# Patient Record
Sex: Female | Born: 1950
Health system: Southern US, Community
[De-identification: ages and names within clinical notes are randomized; demographics above are authoritative.]

## PROBLEM LIST (undated history)

## (undated) DIAGNOSIS — I10 Essential (primary) hypertension: Secondary | ICD-10-CM

## (undated) DIAGNOSIS — M67919 Unspecified disorder of synovium and tendon, unspecified shoulder: Secondary | ICD-10-CM

## (undated) DIAGNOSIS — IMO0001 Reserved for inherently not codable concepts without codable children: Secondary | ICD-10-CM

## (undated) DIAGNOSIS — M8580 Other specified disorders of bone density and structure, unspecified site: Secondary | ICD-10-CM

## (undated) DIAGNOSIS — Z9889 Other specified postprocedural states: Secondary | ICD-10-CM

## (undated) DIAGNOSIS — R112 Nausea with vomiting, unspecified: Secondary | ICD-10-CM

## (undated) DIAGNOSIS — A4902 Methicillin resistant Staphylococcus aureus infection, unspecified site: Secondary | ICD-10-CM

## (undated) DIAGNOSIS — M25512 Pain in left shoulder: Secondary | ICD-10-CM

## (undated) DIAGNOSIS — E785 Hyperlipidemia, unspecified: Secondary | ICD-10-CM

## (undated) DIAGNOSIS — C801 Malignant (primary) neoplasm, unspecified: Secondary | ICD-10-CM

## (undated) DIAGNOSIS — K649 Unspecified hemorrhoids: Secondary | ICD-10-CM

## (undated) DIAGNOSIS — G4730 Sleep apnea, unspecified: Secondary | ICD-10-CM

## (undated) DIAGNOSIS — J45909 Unspecified asthma, uncomplicated: Secondary | ICD-10-CM

## (undated) DIAGNOSIS — H269 Unspecified cataract: Secondary | ICD-10-CM

## (undated) DIAGNOSIS — H3530 Unspecified macular degeneration: Secondary | ICD-10-CM

## (undated) DIAGNOSIS — M1990 Unspecified osteoarthritis, unspecified site: Secondary | ICD-10-CM

## (undated) DIAGNOSIS — G4700 Insomnia, unspecified: Secondary | ICD-10-CM

## (undated) DIAGNOSIS — M26629 Arthralgia of temporomandibular joint, unspecified side: Secondary | ICD-10-CM

## (undated) DIAGNOSIS — G8929 Other chronic pain: Secondary | ICD-10-CM

## (undated) DIAGNOSIS — Z85850 Personal history of malignant neoplasm of thyroid: Secondary | ICD-10-CM

## (undated) DIAGNOSIS — M797 Fibromyalgia: Secondary | ICD-10-CM

## (undated) DIAGNOSIS — Z973 Presence of spectacles and contact lenses: Secondary | ICD-10-CM

## (undated) DIAGNOSIS — H8390 Unspecified disease of inner ear, unspecified ear: Secondary | ICD-10-CM

## (undated) DIAGNOSIS — M810 Age-related osteoporosis without current pathological fracture: Secondary | ICD-10-CM

## (undated) DIAGNOSIS — J189 Pneumonia, unspecified organism: Secondary | ICD-10-CM

## (undated) DIAGNOSIS — T7840XA Allergy, unspecified, initial encounter: Secondary | ICD-10-CM

## (undated) DIAGNOSIS — Z87440 Personal history of urinary (tract) infections: Secondary | ICD-10-CM

## (undated) DIAGNOSIS — M4316 Spondylolisthesis, lumbar region: Secondary | ICD-10-CM

## (undated) DIAGNOSIS — T8484XA Pain due to internal orthopedic prosthetic devices, implants and grafts, initial encounter: Secondary | ICD-10-CM

## (undated) DIAGNOSIS — E039 Hypothyroidism, unspecified: Secondary | ICD-10-CM

## (undated) DIAGNOSIS — IMO0002 Reserved for concepts with insufficient information to code with codable children: Secondary | ICD-10-CM

## (undated) DIAGNOSIS — K219 Gastro-esophageal reflux disease without esophagitis: Secondary | ICD-10-CM

## (undated) DIAGNOSIS — G43709 Chronic migraine without aura, not intractable, without status migrainosus: Secondary | ICD-10-CM

## (undated) DIAGNOSIS — I2510 Atherosclerotic heart disease of native coronary artery without angina pectoris: Secondary | ICD-10-CM

## (undated) DIAGNOSIS — M25511 Pain in right shoulder: Secondary | ICD-10-CM

## (undated) HISTORY — PX: BACK SURGERY: SHX140

## (undated) HISTORY — DX: Unspecified asthma, uncomplicated: J45.909

## (undated) HISTORY — PX: OOPHORECTOMY: SHX86

## (undated) HISTORY — DX: Malignant (primary) neoplasm, unspecified: C80.1

## (undated) HISTORY — PX: OTHER SURGICAL HISTORY: SHX169

## (undated) HISTORY — DX: Sleep apnea, unspecified: G47.30

## (undated) HISTORY — PX: EYE SURGERY: SHX253

## (undated) HISTORY — PX: POLYPECTOMY: SHX149

## (undated) HISTORY — DX: Allergy, unspecified, initial encounter: T78.40XA

## (undated) HISTORY — PX: COLONOSCOPY: SHX174

## (undated) HISTORY — DX: Age-related osteoporosis without current pathological fracture: M81.0

## (undated) HISTORY — DX: Other specified disorders of bone density and structure, unspecified site: M85.80

## (undated) HISTORY — PX: UPPER GASTROINTESTINAL ENDOSCOPY: SHX188

## (undated) HISTORY — DX: Unspecified cataract: H26.9

## (undated) HISTORY — PX: CATARACT EXTRACTION, BILATERAL: SHX1313

---
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
## 1988-07-28 HISTORY — PX: VAGINAL HYSTERECTOMY: SUR661

## 1991-07-29 HISTORY — PX: OTHER SURGICAL HISTORY: SHX169

## 1992-07-28 HISTORY — PX: OTHER SURGICAL HISTORY: SHX169

## 1997-07-28 HISTORY — PX: OTHER SURGICAL HISTORY: SHX169

## 2003-07-29 HISTORY — PX: OTHER SURGICAL HISTORY: SHX169

## 2007-05-18 ENCOUNTER — Encounter (INDEPENDENT_AMBULATORY_CARE_PROVIDER_SITE_OTHER): Payer: Self-pay | Admitting: Interventional Radiology

## 2007-05-18 ENCOUNTER — Encounter: Admission: RE | Admit: 2007-05-18 | Discharge: 2007-05-18 | Payer: Self-pay | Admitting: Emergency Medicine

## 2007-05-18 ENCOUNTER — Other Ambulatory Visit: Admission: RE | Admit: 2007-05-18 | Discharge: 2007-05-18 | Payer: Self-pay | Admitting: Interventional Radiology

## 2007-06-28 ENCOUNTER — Encounter: Admission: RE | Admit: 2007-06-28 | Discharge: 2007-06-28 | Payer: Self-pay | Admitting: Endocrinology

## 2007-07-29 HISTORY — PX: TOTAL THYROIDECTOMY: SHX2547

## 2008-01-17 ENCOUNTER — Encounter: Admission: RE | Admit: 2008-01-17 | Discharge: 2008-01-17 | Payer: Self-pay | Admitting: Endocrinology

## 2008-01-26 ENCOUNTER — Encounter: Admission: RE | Admit: 2008-01-26 | Discharge: 2008-01-26 | Payer: Self-pay | Admitting: Endocrinology

## 2008-02-02 ENCOUNTER — Encounter: Admission: RE | Admit: 2008-02-02 | Discharge: 2008-02-02 | Payer: Self-pay | Admitting: Endocrinology

## 2008-06-15 ENCOUNTER — Encounter: Admission: RE | Admit: 2008-06-15 | Discharge: 2008-07-07 | Payer: Self-pay | Admitting: Pediatrics

## 2009-01-05 ENCOUNTER — Encounter: Admission: RE | Admit: 2009-01-05 | Discharge: 2009-01-16 | Payer: Self-pay | Admitting: Specialist

## 2009-01-25 DIAGNOSIS — IMO0001 Reserved for inherently not codable concepts without codable children: Secondary | ICD-10-CM

## 2009-01-25 HISTORY — DX: Reserved for inherently not codable concepts without codable children: IMO0001

## 2009-03-12 ENCOUNTER — Encounter
Admission: RE | Admit: 2009-03-12 | Discharge: 2009-04-17 | Payer: Self-pay | Admitting: Physical Medicine & Rehabilitation

## 2009-09-17 ENCOUNTER — Encounter (HOSPITAL_COMMUNITY): Admission: RE | Admit: 2009-09-17 | Discharge: 2009-11-27 | Payer: Self-pay | Admitting: Endocrinology

## 2009-09-28 ENCOUNTER — Encounter: Admission: RE | Admit: 2009-09-28 | Discharge: 2009-09-28 | Payer: Self-pay | Admitting: Specialist

## 2009-10-26 ENCOUNTER — Ambulatory Visit (HOSPITAL_BASED_OUTPATIENT_CLINIC_OR_DEPARTMENT_OTHER): Admission: RE | Admit: 2009-10-26 | Discharge: 2009-10-26 | Payer: Self-pay | Admitting: Specialist

## 2009-10-26 HISTORY — PX: OTHER SURGICAL HISTORY: SHX169

## 2009-11-05 ENCOUNTER — Encounter: Admission: RE | Admit: 2009-11-05 | Discharge: 2009-12-03 | Payer: Self-pay | Admitting: Specialist

## 2010-07-16 ENCOUNTER — Inpatient Hospital Stay (HOSPITAL_COMMUNITY)
Admission: RE | Admit: 2010-07-16 | Discharge: 2010-07-19 | Payer: Self-pay | Source: Home / Self Care | Attending: Specialist | Admitting: Specialist

## 2010-07-16 HISTORY — PX: TOTAL KNEE ARTHROPLASTY: SHX125

## 2010-08-07 ENCOUNTER — Encounter
Admission: RE | Admit: 2010-08-07 | Discharge: 2010-08-27 | Payer: Self-pay | Source: Home / Self Care | Attending: Specialist | Admitting: Specialist

## 2010-08-18 ENCOUNTER — Encounter: Payer: Self-pay | Admitting: Emergency Medicine

## 2010-08-23 NOTE — Consult Note (Signed)
NAME:  Debbie Hayes, Debbie Hayes            ACCOUNT NO.:  [id number]  MEDICAL RECORD NO.:  [id number]          PATIENT TYPE:  INP  LOCATION:  1601                         FACILITY:  [facility]  PHYSICIAN:  Massie Maroon, MD        DATE OF BIRTH:  06-Apr-1951  DATE OF CONSULTATION: DATE OF DISCHARGE:                                CONSULTATION   CHIEF COMPLAINT:  Hyperglycemia.  HISTORY OF PRESENT ILLNESS:  This is a 60 year old female, diabetic, type 2, status post end-stage osteoarthritis of the right knee, unresponsive to conservative treatment, status post right total knee arthroplasty on July 16, 2010, apparently was having elevated sugars throughout the day.  The patient apparently had been off her Januvia and covered with insulin sliding scale.  Insulin sliding scale was stopped today and her blood sugars have creeped up as well.  She had a blood sugar this evening of 260 at about 10 o'clock and another 224 at 11:30. Medicine is requested to evaluate her for hyperglycemia.  PAST MEDICAL HISTORY: 1. Diabetes, type 2. 2. Hypertension. 3. Hypothyroidism. 4. History of thyroid cancer. 5. Reflux.  PAST SURGICAL HISTORY: 1. Total thyroidectomy for thyroid cancer. 2. Bilateral breast biopsy - benign. 3. Multiple knee arthroscopies. 4. Hysterectomy. 5. Carpal tunnel release bilaterally. 6. Tennis elbow release bilaterally. 7. Right shoulder surgery and left thumb C[facility] joint surgery.  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  FAMILY HISTORY:  Positive for stroke, hypertension, and heart disease.  ALLERGIES: 1. SULFA. 2. MORPHINE SULFATE.  MEDICATIONS: 1. Colace 100 mg p.o. b.i.d. 2. Lovenox 30 mg subcutaneous q.12 h. 3. Pepcid 40 mg p.o. q.h.s. 4. Ferrous sulfate 325 mg p.o. t.i.d. 5. Levothyroxine 175 mcg p.o. q.h.s. 6. Cozaar 25 mg p.o. q.h.s. 7. Lopressor 25 mg p.o. daily. 8. Crestor 2.5 mg p.o. q.h.s. 9. Senokot 1  p.o. b.i.d. as needed. 10.Januvia 100 mg p.o. daily. 11.Tylenol 650 mg p.o. q.4 h. p.r.n. pain. 12.Dulcolax 10 mg p.o. daily p.r.n. constipation. 13.Dulcolax suppository 10 mg per rectum daily p.r.n. constipation. 14.Benadryl 25 mg p.o. q.h.s. p.r.n. anxiety. 15.Relpax 40 mg p.o. b.i.d. p.r.n. 16.Flonase 2 sprays intranasally daily as needed. 17.Dilaudid 0.2-0.6 mg IV q.2 h. as needed. 18.Claritin 10 mg p.o. daily as needed. 19.Robaxin 500 mg p.o. q.6 h. p.r.n. 20.Reglan 5 mg p.o./IV q.8 h. p.r.n. 21.Zofran 4 mg IV q.6 h. p.r.n. 22.Restoril 15 mg to 30 mg p.o. q.h.s. p.r.n. insomnia.  PHYSICAL EXAMINATION:  VITAL SIGNS:  Temperature 98.5; pulse 102, repeat 85; blood pressure 144/69, pulse ox of 96% on room air. HEENT:  Anicteric. NECK:  No JVD, no bruit. HEART:  Regular rate and rhythm.  S1 and S2.  No murmurs, gallops, or rubs. LUNGS:  Clear to auscultation bilaterally. ABDOMEN:  Soft, nontender, nondistended.  Positive bowel sounds. EXTREMITIES:  No cyanosis, clubbing, or edema.  LABORATORY DATA:  Sodium 136, potassium 4.1, BUN 7, creatinine 0.63, glucose 185, WBC 11.1, hemoglobin 10.7, platelet count 154.  ASSESSMENT/PLAN: 1. Hyperglycemia:  The patient has been eating regular diet rather     than carb modified or  ADA 2500-calorie diet.  Januvia was just     restarted today and sometimes takes a period of time before it     starts to work well.  The other reason why her blood sugar may be     elevated is that it is not unusual after surgery to see blood sugar     to be somewhat elevated.  The patient will be covered with NovoLog     sensitive sliding scale and if her blood sugar is not improving,     then we will consider adding Amaryl 1 mg p.o. daily to her Januvia.     I considered metformin,     but she said it causes her to have GI upset.  Therefore, we will     try Amaryl if her blood sugars do not calm down tomorrow.  Thank you for letting us to follow her for her  hyperglycemia.     Massie Maroon, MD     JYK/MEDQ  D:  07/18/2010  T:  07/18/2010  Job:  [id number]  cc:   Erasmo Leventhal, M.D. Fax: [phone]  Stan Head. Cleta Alberts, M.D. Fax: [phone]  Electronically Signed by Pearson Grippe MD on 08/23/2010 11:30:34 PM

## 2010-08-28 ENCOUNTER — Ambulatory Visit: Payer: 59 | Attending: Specialist | Admitting: Physical Therapy

## 2010-08-28 DIAGNOSIS — IMO0001 Reserved for inherently not codable concepts without codable children: Secondary | ICD-10-CM | POA: Insufficient documentation

## 2010-08-28 DIAGNOSIS — M25569 Pain in unspecified knee: Secondary | ICD-10-CM | POA: Insufficient documentation

## 2010-08-28 DIAGNOSIS — M25669 Stiffness of unspecified knee, not elsewhere classified: Secondary | ICD-10-CM | POA: Insufficient documentation

## 2010-08-28 DIAGNOSIS — Z96659 Presence of unspecified artificial knee joint: Secondary | ICD-10-CM | POA: Insufficient documentation

## 2010-08-30 ENCOUNTER — Ambulatory Visit: Payer: 59 | Admitting: Physical Therapy

## 2010-08-30 ENCOUNTER — Encounter: Payer: Self-pay | Admitting: Physical Therapy

## 2010-09-02 ENCOUNTER — Ambulatory Visit: Payer: 59 | Attending: Specialist | Admitting: Physical Therapy

## 2010-09-02 DIAGNOSIS — IMO0001 Reserved for inherently not codable concepts without codable children: Secondary | ICD-10-CM | POA: Insufficient documentation

## 2010-09-02 DIAGNOSIS — M25569 Pain in unspecified knee: Secondary | ICD-10-CM | POA: Insufficient documentation

## 2010-09-02 DIAGNOSIS — Z96659 Presence of unspecified artificial knee joint: Secondary | ICD-10-CM | POA: Insufficient documentation

## 2010-09-02 DIAGNOSIS — M25669 Stiffness of unspecified knee, not elsewhere classified: Secondary | ICD-10-CM | POA: Insufficient documentation

## 2010-09-05 ENCOUNTER — Ambulatory Visit: Payer: 59 | Admitting: Physical Therapy

## 2010-09-06 ENCOUNTER — Ambulatory Visit: Payer: 59 | Admitting: Physical Therapy

## 2010-09-09 ENCOUNTER — Ambulatory Visit: Payer: 59 | Admitting: Physical Therapy

## 2010-09-11 ENCOUNTER — Ambulatory Visit (HOSPITAL_BASED_OUTPATIENT_CLINIC_OR_DEPARTMENT_OTHER)
Admission: RE | Admit: 2010-09-11 | Discharge: 2010-09-11 | Disposition: A | Discharge status: Home / Self Care | Payer: 59 | Source: Ambulatory Visit | Attending: Specialist | Admitting: Specialist

## 2010-09-11 DIAGNOSIS — M24669 Ankylosis, unspecified knee: Secondary | ICD-10-CM | POA: Insufficient documentation

## 2010-09-11 HISTORY — PX: OTHER SURGICAL HISTORY: SHX169

## 2010-09-11 LAB — POCT I-STAT 4, (NA,K, GLUC, HGB,HCT)
Glucose, Bld: 122 mg/dL — ABNORMAL HIGH (ref 70–99)
HCT: 41 % (ref 36.0–46.0)
Hemoglobin: 13.9 g/dL (ref 12.0–15.0)
Potassium: 3.9 mEq/L (ref 3.5–5.1)
Sodium: 137 mEq/L (ref 135–145)

## 2010-09-12 ENCOUNTER — Ambulatory Visit: Payer: 59 | Admitting: Physical Therapy

## 2010-09-13 ENCOUNTER — Ambulatory Visit: Payer: 59 | Admitting: Physical Therapy

## 2010-09-16 ENCOUNTER — Ambulatory Visit: Payer: 59 | Admitting: Physical Therapy

## 2010-09-17 ENCOUNTER — Ambulatory Visit: Payer: 59 | Admitting: Physical Therapy

## 2010-09-18 ENCOUNTER — Ambulatory Visit: Payer: 59 | Admitting: Physical Therapy

## 2010-09-19 ENCOUNTER — Ambulatory Visit: Payer: 59 | Admitting: Physical Therapy

## 2010-09-20 ENCOUNTER — Ambulatory Visit: Payer: 59 | Admitting: Physical Therapy

## 2010-09-23 ENCOUNTER — Ambulatory Visit: Payer: 59 | Admitting: Physical Therapy

## 2010-09-24 ENCOUNTER — Ambulatory Visit: Payer: 59 | Admitting: Physical Therapy

## 2010-09-25 NOTE — Op Note (Signed)
  NAME:  Debbie Hayes, Debbie Hayes            ACCOUNT NO.:  [id number]  MEDICAL RECORD NO.:  [id number]           PATIENT TYPE:  I  LOCATION:  [facility]                         FACILITY:  [facility]  PHYSICIAN:  Erasmo Leventhal, M.D.DATE OF BIRTH:  Dec 14, 1950  DATE OF PROCEDURE: DATE OF DISCHARGE:                              OPERATIVE REPORT   PREOPERATIVE DIAGNOSIS:  Right knee postoperative arthrofibrosis.  POSTOPERATIVE DIAGNOSIS:  Right knee postoperative arthrofibrosis.  PROCEDURE:  Right knee manipulation under anesthesia.  SURGEON:  Erasmo Leventhal, MD.  ANESTHESIOLOGIST:  Dr. Rica Mast.  ANESTHESIA:  General with femoral nerve block.  ESTIMATED BLOOD LOSS:  None.  COMPLICATIONS:  None.  DISPOSITION:  PACU stable.  DESCRIPTION OF PROCEDURE:  The patient and family counselled in the holding area.  Correct side was identified.  IV was stated and femoral nerve block was administered.  She was then taken to the operative room, placed under general anesthesia.  Initial range of motion was 5 to about 80 degrees.  After gentle manipulation flexion and extension, final range of motion was 3 to 120 degrees.  Knee was stable.  No complications or problems.  Also immobilized patella.  Placed Ace wrap, ice pack, and knee immobilizer.  Focused attention.  She was awakened and taken from the operating room to PACU in stable condition.  She will be discharged home.  I instructed to her husband.  Postop pain medications; oxycodone, Tylenol, Robaxin.          ______________________________ Erasmo Leventhal, M.D.     RAC/MEDQ  D:  09/11/2010  T:  09/12/2010  Job:  [id number]  Electronically Signed by Eugenia Mcalpine M.D. on 09/25/2010 05:19:51 PM

## 2010-09-26 ENCOUNTER — Ambulatory Visit: Payer: 59 | Attending: Specialist | Admitting: Physical Therapy

## 2010-09-26 DIAGNOSIS — Z96659 Presence of unspecified artificial knee joint: Secondary | ICD-10-CM | POA: Insufficient documentation

## 2010-09-26 DIAGNOSIS — IMO0001 Reserved for inherently not codable concepts without codable children: Secondary | ICD-10-CM | POA: Insufficient documentation

## 2010-09-26 DIAGNOSIS — M25569 Pain in unspecified knee: Secondary | ICD-10-CM | POA: Insufficient documentation

## 2010-09-26 DIAGNOSIS — M25669 Stiffness of unspecified knee, not elsewhere classified: Secondary | ICD-10-CM | POA: Insufficient documentation

## 2010-09-27 ENCOUNTER — Ambulatory Visit: Payer: 59 | Admitting: Physical Therapy

## 2010-10-01 ENCOUNTER — Ambulatory Visit: Payer: 59 | Admitting: Physical Therapy

## 2010-10-02 ENCOUNTER — Ambulatory Visit: Payer: 59 | Admitting: Physical Therapy

## 2010-10-03 ENCOUNTER — Ambulatory Visit: Payer: 59 | Admitting: Physical Therapy

## 2010-10-04 ENCOUNTER — Ambulatory Visit: Payer: 59 | Admitting: Physical Therapy

## 2010-10-07 ENCOUNTER — Ambulatory Visit: Payer: 59 | Admitting: Physical Therapy

## 2010-10-07 LAB — BASIC METABOLIC PANEL
BUN: 7 mg/dL (ref 6–23)
BUN: 7 mg/dL (ref 6–23)
CO2: 28 mEq/L (ref 19–32)
Calcium: 8.4 mg/dL (ref 8.4–10.5)
Chloride: 101 mEq/L (ref 96–112)
Chloride: 103 mEq/L (ref 96–112)
Creatinine, Ser: 0.63 mg/dL (ref 0.4–1.2)
GFR calc Af Amer: >60 mL/min (ref >60)
GFR calc Af Amer: >60 mL/min (ref >60)
GFR calc non Af Amer: >60 mL/min (ref >60)
GFR calc non Af Amer: >60 mL/min (ref >60)
Glucose, Bld: 185 mg/dL — ABNORMAL HIGH (ref 70–99)
Potassium: 3.1 mEq/L — ABNORMAL LOW (ref 3.5–5.1)
Sodium: 135 mEq/L (ref 135–145)
Sodium: 139 mEq/L (ref 135–145)

## 2010-10-07 LAB — CBC
HCT: 30.3 % — ABNORMAL LOW (ref 36.0–46.0)
HCT: 30.9 % — ABNORMAL LOW (ref 36.0–46.0)
Hemoglobin: 10.1 g/dL — ABNORMAL LOW (ref 12.0–15.0)
Hemoglobin: 10.3 g/dL — ABNORMAL LOW (ref 12.0–15.0)
Hemoglobin: 10.7 g/dL — ABNORMAL LOW (ref 12.0–15.0)
MCH: 28.1 pg (ref 26.0–34.0)
MCH: 28.5 pg (ref 26.0–34.0)
MCHC: 32.5 g/dL (ref 30.0–36.0)
MCHC: 33.3 g/dL (ref 30.0–36.0)
MCV: 84.7 fL (ref 78.0–100.0)
MCV: 85.4 fL (ref 78.0–100.0)
MCV: 86.4 fL (ref 78.0–100.0)
Platelets: 154 10*3/uL (ref 150–400)
RBC: 3.55 MIL/uL — ABNORMAL LOW (ref 3.87–5.11)
RBC: 3.65 MIL/uL — ABNORMAL LOW (ref 3.87–5.11)
RDW: 13.5 % (ref 11.5–15.5)
WBC: 9.5 10*3/uL (ref 4.0–10.5)

## 2010-10-07 LAB — GLUCOSE, CAPILLARY
Glucose-Capillary: 186 mg/dL — ABNORMAL HIGH (ref 70–99)
Glucose-Capillary: 190 mg/dL — ABNORMAL HIGH (ref 70–99)
Glucose-Capillary: 224 mg/dL — ABNORMAL HIGH (ref 70–99)
Glucose-Capillary: 90 mg/dL (ref 70–99)

## 2010-10-07 LAB — CROSSMATCH: Unit division: 0

## 2010-10-08 LAB — APTT: aPTT: 36 seconds (ref 24–37)

## 2010-10-08 LAB — SURGICAL PCR SCREEN
MRSA, PCR: NEGATIVE
Staphylococcus aureus: NEGATIVE

## 2010-10-08 LAB — COMPREHENSIVE METABOLIC PANEL
ALT: 21 U/L (ref 0–35)
Albumin: 4.2 g/dL (ref 3.5–5.2)
BUN: 17 mg/dL (ref 6–23)
Calcium: 9.8 mg/dL (ref 8.4–10.5)
Glucose, Bld: 123 mg/dL — ABNORMAL HIGH (ref 70–99)
Potassium: 4.1 mEq/L (ref 3.5–5.1)
Sodium: 136 mEq/L (ref 135–145)
Total Protein: 7.3 g/dL (ref 6.0–8.3)

## 2010-10-08 LAB — DIFFERENTIAL
Lymphs Abs: 1.2 10*3/uL (ref 0.7–4.0)
Monocytes Absolute: 0.9 10*3/uL (ref 0.1–1.0)
Monocytes Relative: 15 % — ABNORMAL HIGH (ref 3–12)
Neutro Abs: 3.5 10*3/uL (ref 1.7–7.7)
Neutrophils Relative %: 61 % (ref 43–77)

## 2010-10-08 LAB — URINALYSIS, ROUTINE W REFLEX MICROSCOPIC
Glucose, UA: NEGATIVE mg/dL
Ketones, ur: NEGATIVE mg/dL
Protein, ur: NEGATIVE mg/dL
Urobilinogen, UA: 0.2 mg/dL (ref 0.0–1.0)

## 2010-10-08 LAB — CBC
HCT: 42.6 % (ref 36.0–46.0)
MCHC: 33.3 g/dL (ref 30.0–36.0)
Platelets: 185 10*3/uL (ref 150–400)
RDW: 13.3 % (ref 11.5–15.5)
WBC: 5.7 10*3/uL (ref 4.0–10.5)

## 2010-10-08 LAB — PROTIME-INR: INR: 0.98 (ref 0.00–1.49)

## 2010-10-10 ENCOUNTER — Ambulatory Visit: Payer: 59 | Admitting: Physical Therapy

## 2010-10-11 ENCOUNTER — Ambulatory Visit: Payer: 59 | Admitting: Physical Therapy

## 2010-10-14 ENCOUNTER — Ambulatory Visit: Payer: 59 | Admitting: Rehabilitation

## 2010-10-16 ENCOUNTER — Encounter: Payer: 59 | Admitting: Physical Therapy

## 2010-10-16 LAB — POCT I-STAT 4, (NA,K, GLUC, HGB,HCT)
Glucose, Bld: 109 mg/dL — ABNORMAL HIGH (ref 70–99)
HCT: 40 % (ref 36.0–46.0)
Potassium: 3.8 mEq/L (ref 3.5–5.1)

## 2010-10-17 ENCOUNTER — Ambulatory Visit: Payer: 59 | Admitting: Physical Therapy

## 2010-10-24 ENCOUNTER — Ambulatory Visit: Payer: 59 | Admitting: Physical Therapy

## 2010-10-25 ENCOUNTER — Ambulatory Visit: Payer: 59 | Admitting: Physical Therapy

## 2010-10-30 ENCOUNTER — Ambulatory Visit: Payer: 59 | Attending: Specialist | Admitting: Rehabilitation

## 2010-10-30 DIAGNOSIS — Z96659 Presence of unspecified artificial knee joint: Secondary | ICD-10-CM | POA: Insufficient documentation

## 2010-10-30 DIAGNOSIS — IMO0001 Reserved for inherently not codable concepts without codable children: Secondary | ICD-10-CM | POA: Insufficient documentation

## 2010-10-30 DIAGNOSIS — M25669 Stiffness of unspecified knee, not elsewhere classified: Secondary | ICD-10-CM | POA: Insufficient documentation

## 2010-10-30 DIAGNOSIS — M25569 Pain in unspecified knee: Secondary | ICD-10-CM | POA: Insufficient documentation

## 2010-10-31 ENCOUNTER — Ambulatory Visit: Payer: 59 | Admitting: Physical Therapy

## 2010-11-04 ENCOUNTER — Ambulatory Visit: Payer: 59 | Admitting: Physical Therapy

## 2010-11-06 ENCOUNTER — Ambulatory Visit: Payer: 59 | Admitting: Physical Therapy

## 2010-11-07 ENCOUNTER — Other Ambulatory Visit: Payer: Self-pay | Admitting: Radiology

## 2010-11-11 ENCOUNTER — Ambulatory Visit: Payer: 59 | Admitting: Physical Therapy

## 2010-11-13 ENCOUNTER — Ambulatory Visit: Payer: 59 | Admitting: Physical Therapy

## 2010-11-15 ENCOUNTER — Ambulatory Visit: Payer: 59 | Admitting: Physical Therapy

## 2010-11-18 ENCOUNTER — Ambulatory Visit: Payer: 59 | Admitting: Physical Therapy

## 2010-11-20 ENCOUNTER — Ambulatory Visit: Payer: 59 | Admitting: Physical Therapy

## 2010-11-25 ENCOUNTER — Ambulatory Visit: Payer: 59 | Admitting: Physical Therapy

## 2010-11-28 ENCOUNTER — Ambulatory Visit: Payer: 59 | Admitting: Physical Therapy

## 2011-04-22 ENCOUNTER — Other Ambulatory Visit (HOSPITAL_COMMUNITY): Payer: Self-pay | Admitting: Neurology

## 2011-04-22 DIAGNOSIS — R209 Unspecified disturbances of skin sensation: Secondary | ICD-10-CM

## 2011-05-01 ENCOUNTER — Ambulatory Visit (HOSPITAL_COMMUNITY)
Admission: RE | Admit: 2011-05-01 | Discharge: 2011-05-01 | Disposition: A | Discharge status: Home / Self Care | Payer: 59 | Source: Ambulatory Visit | Attending: Neurology | Admitting: Neurology

## 2011-05-01 DIAGNOSIS — R209 Unspecified disturbances of skin sensation: Secondary | ICD-10-CM | POA: Insufficient documentation

## 2011-05-29 ENCOUNTER — Ambulatory Visit (HOSPITAL_COMMUNITY)
Admission: RE | Admit: 2011-05-29 | Discharge: 2011-05-29 | Disposition: A | Discharge status: Home / Self Care | Payer: 59 | Source: Ambulatory Visit | Attending: Emergency Medicine | Admitting: Emergency Medicine

## 2011-05-29 DIAGNOSIS — R209 Unspecified disturbances of skin sensation: Secondary | ICD-10-CM | POA: Insufficient documentation

## 2011-05-29 DIAGNOSIS — R42 Dizziness and giddiness: Secondary | ICD-10-CM

## 2011-05-29 DIAGNOSIS — R4789 Other speech disturbances: Secondary | ICD-10-CM | POA: Insufficient documentation

## 2011-05-29 DIAGNOSIS — R0989 Other specified symptoms and signs involving the circulatory and respiratory systems: Secondary | ICD-10-CM

## 2011-06-06 ENCOUNTER — Other Ambulatory Visit (HOSPITAL_COMMUNITY): Payer: Self-pay | Admitting: Neurology

## 2011-06-06 ENCOUNTER — Other Ambulatory Visit: Payer: Self-pay | Admitting: Neurology

## 2011-06-06 DIAGNOSIS — E119 Type 2 diabetes mellitus without complications: Secondary | ICD-10-CM

## 2011-06-06 DIAGNOSIS — R209 Unspecified disturbances of skin sensation: Secondary | ICD-10-CM

## 2011-06-06 DIAGNOSIS — I10 Essential (primary) hypertension: Secondary | ICD-10-CM

## 2011-06-13 ENCOUNTER — Ambulatory Visit (HOSPITAL_COMMUNITY): Payer: 59

## 2011-06-13 ENCOUNTER — Ambulatory Visit (HOSPITAL_COMMUNITY)
Admission: RE | Admit: 2011-06-13 | Discharge: 2011-06-13 | Disposition: A | Discharge status: Home / Self Care | Payer: 59 | Source: Ambulatory Visit | Attending: Neurology | Admitting: Neurology

## 2011-06-13 ENCOUNTER — Other Ambulatory Visit (HOSPITAL_COMMUNITY): Payer: Self-pay | Admitting: Radiology

## 2011-06-13 DIAGNOSIS — E119 Type 2 diabetes mellitus without complications: Secondary | ICD-10-CM

## 2011-06-13 DIAGNOSIS — I10 Essential (primary) hypertension: Secondary | ICD-10-CM

## 2011-06-13 DIAGNOSIS — R209 Unspecified disturbances of skin sensation: Secondary | ICD-10-CM

## 2011-06-13 LAB — CREATININE, SERUM: GFR calc Af Amer: >90 mL/min (ref >90)

## 2011-06-13 MED ORDER — GADOBENATE DIMEGLUMINE 529 MG/ML IV SOLN
17.0000 mL | Freq: Once | INTRAVENOUS | Status: AC
  Administered 2011-06-13: 17 mL via INTRAVENOUS

## 2011-07-10 ENCOUNTER — Ambulatory Visit (INDEPENDENT_AMBULATORY_CARE_PROVIDER_SITE_OTHER): Payer: 59

## 2011-07-10 DIAGNOSIS — S93419A Sprain of calcaneofibular ligament of unspecified ankle, initial encounter: Secondary | ICD-10-CM

## 2011-07-16 ENCOUNTER — Ambulatory Visit (INDEPENDENT_AMBULATORY_CARE_PROVIDER_SITE_OTHER): Payer: 59

## 2011-07-16 DIAGNOSIS — E7800 Pure hypercholesterolemia, unspecified: Secondary | ICD-10-CM

## 2011-07-16 DIAGNOSIS — I10 Essential (primary) hypertension: Secondary | ICD-10-CM

## 2011-07-16 DIAGNOSIS — E119 Type 2 diabetes mellitus without complications: Secondary | ICD-10-CM

## 2011-07-29 HISTORY — PX: OTHER SURGICAL HISTORY: SHX169

## 2011-09-05 ENCOUNTER — Encounter (HOSPITAL_BASED_OUTPATIENT_CLINIC_OR_DEPARTMENT_OTHER): Payer: Self-pay | Admitting: *Deleted

## 2011-09-05 ENCOUNTER — Other Ambulatory Visit: Payer: Self-pay | Admitting: Pain Medicine

## 2011-09-08 ENCOUNTER — Encounter (HOSPITAL_BASED_OUTPATIENT_CLINIC_OR_DEPARTMENT_OTHER): Payer: Self-pay | Admitting: *Deleted

## 2011-09-08 NOTE — Progress Notes (Signed)
NPO AFTER MN ARRIVES AT 1030. NEEDS ISTAT AND EKG. WILL TAKE LOPRESSOR AND NEXIUM AM OF SURG. W/ SIP OF WATER.

## 2011-09-10 ENCOUNTER — Ambulatory Visit (HOSPITAL_BASED_OUTPATIENT_CLINIC_OR_DEPARTMENT_OTHER): Payer: 59 | Admitting: Anesthesiology

## 2011-09-10 ENCOUNTER — Other Ambulatory Visit: Payer: Self-pay

## 2011-09-10 ENCOUNTER — Encounter (HOSPITAL_BASED_OUTPATIENT_CLINIC_OR_DEPARTMENT_OTHER)
Admission: RE | Disposition: A | Discharge status: Home / Self Care | Payer: Self-pay | Source: Ambulatory Visit | Attending: Specialist

## 2011-09-10 ENCOUNTER — Encounter (HOSPITAL_BASED_OUTPATIENT_CLINIC_OR_DEPARTMENT_OTHER): Payer: Self-pay | Admitting: *Deleted

## 2011-09-10 ENCOUNTER — Encounter (HOSPITAL_BASED_OUTPATIENT_CLINIC_OR_DEPARTMENT_OTHER): Payer: Self-pay | Admitting: Anesthesiology

## 2011-09-10 ENCOUNTER — Ambulatory Visit (HOSPITAL_BASED_OUTPATIENT_CLINIC_OR_DEPARTMENT_OTHER)
Admission: RE | Admit: 2011-09-10 | Discharge: 2011-09-10 | Disposition: A | Discharge status: Home / Self Care | Payer: 59 | Source: Ambulatory Visit | Attending: Specialist | Admitting: Specialist

## 2011-09-10 DIAGNOSIS — K219 Gastro-esophageal reflux disease without esophagitis: Secondary | ICD-10-CM | POA: Insufficient documentation

## 2011-09-10 DIAGNOSIS — Z01810 Encounter for preprocedural cardiovascular examination: Secondary | ICD-10-CM | POA: Insufficient documentation

## 2011-09-10 DIAGNOSIS — M19019 Primary osteoarthritis, unspecified shoulder: Secondary | ICD-10-CM | POA: Insufficient documentation

## 2011-09-10 DIAGNOSIS — E785 Hyperlipidemia, unspecified: Secondary | ICD-10-CM | POA: Insufficient documentation

## 2011-09-10 DIAGNOSIS — E039 Hypothyroidism, unspecified: Secondary | ICD-10-CM | POA: Insufficient documentation

## 2011-09-10 DIAGNOSIS — M75100 Unspecified rotator cuff tear or rupture of unspecified shoulder, not specified as traumatic: Secondary | ICD-10-CM | POA: Insufficient documentation

## 2011-09-10 DIAGNOSIS — M25519 Pain in unspecified shoulder: Secondary | ICD-10-CM | POA: Insufficient documentation

## 2011-09-10 DIAGNOSIS — E119 Type 2 diabetes mellitus without complications: Secondary | ICD-10-CM | POA: Insufficient documentation

## 2011-09-10 DIAGNOSIS — X58XXXA Exposure to other specified factors, initial encounter: Secondary | ICD-10-CM | POA: Insufficient documentation

## 2011-09-10 DIAGNOSIS — Z79899 Other long term (current) drug therapy: Secondary | ICD-10-CM | POA: Insufficient documentation

## 2011-09-10 DIAGNOSIS — S46819A Strain of other muscles, fascia and tendons at shoulder and upper arm level, unspecified arm, initial encounter: Secondary | ICD-10-CM | POA: Insufficient documentation

## 2011-09-10 DIAGNOSIS — Z9889 Other specified postprocedural states: Secondary | ICD-10-CM

## 2011-09-10 DIAGNOSIS — M25819 Other specified joint disorders, unspecified shoulder: Secondary | ICD-10-CM | POA: Insufficient documentation

## 2011-09-10 DIAGNOSIS — IMO0002 Reserved for concepts with insufficient information to code with codable children: Secondary | ICD-10-CM | POA: Insufficient documentation

## 2011-09-10 DIAGNOSIS — S43429A Sprain of unspecified rotator cuff capsule, initial encounter: Secondary | ICD-10-CM | POA: Insufficient documentation

## 2011-09-10 DIAGNOSIS — M24119 Other articular cartilage disorders, unspecified shoulder: Secondary | ICD-10-CM | POA: Insufficient documentation

## 2011-09-10 HISTORY — DX: Nausea with vomiting, unspecified: R11.2

## 2011-09-10 HISTORY — DX: Hyperlipidemia, unspecified: E78.5

## 2011-09-10 HISTORY — DX: Hypothyroidism, unspecified: E03.9

## 2011-09-10 HISTORY — DX: Reserved for concepts with insufficient information to code with codable children: IMO0002

## 2011-09-10 HISTORY — DX: Pain in left shoulder: M25.512

## 2011-09-10 HISTORY — DX: Reserved for inherently not codable concepts without codable children: IMO0001

## 2011-09-10 HISTORY — DX: Unspecified osteoarthritis, unspecified site: M19.90

## 2011-09-10 HISTORY — DX: Arthralgia of temporomandibular joint, unspecified side: M26.629

## 2011-09-10 HISTORY — DX: Unspecified hemorrhoids: K64.9

## 2011-09-10 HISTORY — DX: Unspecified disorder of synovium and tendon, unspecified shoulder: M67.919

## 2011-09-10 HISTORY — DX: Fibromyalgia: M79.7

## 2011-09-10 HISTORY — DX: Other chronic pain: G89.29

## 2011-09-10 HISTORY — DX: Gastro-esophageal reflux disease without esophagitis: K21.9

## 2011-09-10 HISTORY — DX: Pain in right shoulder: M25.511

## 2011-09-10 HISTORY — DX: Spondylolisthesis, lumbar region: M43.16

## 2011-09-10 HISTORY — DX: Unspecified disease of inner ear, unspecified ear: H83.90

## 2011-09-10 HISTORY — DX: Other specified postprocedural states: Z98.890

## 2011-09-10 HISTORY — DX: Chronic migraine without aura, not intractable, without status migrainosus: G43.709

## 2011-09-10 HISTORY — DX: Insomnia, unspecified: G47.00

## 2011-09-10 HISTORY — DX: Essential (primary) hypertension: I10

## 2011-09-10 HISTORY — DX: Personal history of malignant neoplasm of thyroid: Z85.850

## 2011-09-10 LAB — POCT I-STAT 4, (NA,K, GLUC, HGB,HCT)
Glucose, Bld: 120 mg/dL — ABNORMAL HIGH (ref 70–99)
Potassium: 4.1 mEq/L (ref 3.5–5.1)

## 2011-09-10 LAB — GLUCOSE, CAPILLARY: Glucose-Capillary: 111 mg/dL — ABNORMAL HIGH (ref 70–99)

## 2011-09-10 SURGERY — SHOULDER ARTHROSCOPY WITH SUBACROMIAL DECOMPRESSION, ROTATOR CUFF REPAIR AND BICEP TENDON REPAIR
Anesthesia: General | Site: Shoulder | Laterality: Left | Wound class: Clean

## 2011-09-10 MED ORDER — FENTANYL CITRATE 0.05 MG/ML IJ SOLN: 25.0000 ug | INTRAMUSCULAR | Status: DC | PRN

## 2011-09-10 MED ORDER — PROPOFOL 10 MG/ML IV EMUL
INTRAVENOUS | Status: DC | PRN
  Administered 2011-09-10: 120 mg via INTRAVENOUS
  Administered 2011-09-10: 30 mg via INTRAVENOUS

## 2011-09-10 MED ORDER — LIDOCAINE HCL (CARDIAC) 20 MG/ML IV SOLN
INTRAVENOUS | Status: DC | PRN
  Administered 2011-09-10: 50 mg via INTRAVENOUS

## 2011-09-10 MED ORDER — FENTANYL CITRATE 0.05 MG/ML IJ SOLN
50.0000 ug | Freq: Two times a day (BID) | INTRAMUSCULAR | Status: DC
  Administered 2011-09-10: 50 ug via INTRAVENOUS

## 2011-09-10 MED ORDER — ONDANSETRON HCL 4 MG/2ML IJ SOLN
INTRAMUSCULAR | Status: DC | PRN
  Administered 2011-09-10: 4 mg via INTRAVENOUS

## 2011-09-10 MED ORDER — GLYCOPYRROLATE 0.2 MG/ML IJ SOLN
INTRAMUSCULAR | Status: DC | PRN
  Administered 2011-09-10: 0.3 mg via INTRAVENOUS

## 2011-09-10 MED ORDER — GLYCOPYRROLATE 0.2 MG/ML IJ SOLN
INTRAMUSCULAR | Status: DC | PRN
  Administered 2011-09-10: 0.2 mg via INTRAVENOUS

## 2011-09-10 MED ORDER — MIDAZOLAM HCL 5 MG/ML IJ SOLN
1.0000 mg | Freq: Two times a day (BID) | INTRAMUSCULAR | Status: DC
  Administered 2011-09-10: 1 mg via INTRAVENOUS

## 2011-09-10 MED ORDER — CEPHALEXIN 500 MG PO CAPS: 500.0000 mg | ORAL_CAPSULE | Freq: Three times a day (TID) | ORAL | Status: AC

## 2011-09-10 MED ORDER — POVIDONE-IODINE 7.5 % EX SOLN: Freq: Once | CUTANEOUS | Status: DC

## 2011-09-10 MED ORDER — PROMETHAZINE HCL 25 MG/ML IJ SOLN: 6.2500 mg | INTRAMUSCULAR | Status: DC | PRN

## 2011-09-10 MED ORDER — HYDROMORPHONE HCL 2 MG PO TABS: 2.0000 mg | ORAL_TABLET | ORAL | Status: AC | PRN

## 2011-09-10 MED ORDER — ROCURONIUM BROMIDE 100 MG/10ML IV SOLN
INTRAVENOUS | Status: DC | PRN
  Administered 2011-09-10: 35 mg via INTRAVENOUS

## 2011-09-10 MED ORDER — LACTATED RINGERS IV SOLN
INTRAVENOUS | Status: DC
  Administered 2011-09-10 (×2): via INTRAVENOUS

## 2011-09-10 MED ORDER — CEFAZOLIN SODIUM-DEXTROSE 2-3 GM-% IV SOLR
2.0000 g | INTRAVENOUS | Status: AC
  Administered 2011-09-10: 2 g via INTRAVENOUS

## 2011-09-10 MED ORDER — NEOSTIGMINE METHYLSULFATE 1 MG/ML IJ SOLN
INTRAMUSCULAR | Status: DC | PRN
  Administered 2011-09-10: 1.5 mg via INTRAVENOUS

## 2011-09-10 MED ORDER — ROPIVACAINE HCL 5 MG/ML IJ SOLN
INTRAMUSCULAR | Status: DC | PRN
  Administered 2011-09-10: 30 mL

## 2011-09-10 MED ORDER — SODIUM CHLORIDE 0.9 % IR SOLN
Status: DC | PRN
  Administered 2011-09-10: 18000 mL

## 2011-09-10 MED ORDER — SODIUM CHLORIDE 0.9 % IV SOLN: INTRAVENOUS | Status: DC

## 2011-09-10 MED ORDER — FENTANYL CITRATE 0.05 MG/ML IJ SOLN
INTRAMUSCULAR | Status: DC | PRN
  Administered 2011-09-10: 50 ug via INTRAVENOUS

## 2011-09-10 MED ORDER — SODIUM CHLORIDE 0.9 % IJ SOLN
INTRAMUSCULAR | Status: DC | PRN
  Administered 2011-09-10: 14:00:00

## 2011-09-10 SURGICAL SUPPLY — 73 items
BLADE CUDA 4.2 (BLADE) IMPLANT
BLADE CUDA GRT WHITE 3.5 (BLADE) ×2 IMPLANT
BLADE CUTTER GATOR 3.5 (BLADE) IMPLANT
BLADE GREAT WHITE 4.2 (BLADE) ×2 IMPLANT
BLADE SURG 11 STRL SS (BLADE) ×2 IMPLANT
BLADE SURG 15 STRL LF DISP TIS (BLADE) ×1 IMPLANT
BLADE SURG 15 STRL SS (BLADE) ×1
BUPIVACAINE IMPLANT
BUR 3.5 LG SPHERICAL (BURR) IMPLANT
BUR OVAL 6.0 (BURR) ×2 IMPLANT
BURR 3.5 LG SPHERICAL (BURR)
CANISTER SUCT LVC 12 LTR MEDI- (MISCELLANEOUS) ×6 IMPLANT
CANISTER SUCTION 2500CC (MISCELLANEOUS) IMPLANT
CANNULA 5.75X7 CRYSTAL CLEAR (CANNULA) ×2 IMPLANT
CANNULA 5.75X71 LONG (CANNULA) IMPLANT
CANNULA TWIST IN 8.25X7CM (CANNULA) ×4 IMPLANT
CLOTH BEACON ORANGE TIMEOUT ST (SAFETY) ×2 IMPLANT
COVER MAYO STAND STRL (DRAPES) ×2 IMPLANT
COVER TABLE BACK 60X90 (DRAPES) ×2 IMPLANT
DRAPE LG THREE QUARTER DISP (DRAPES) IMPLANT
DRAPE ORTHO SPLIT 77X108 STRL (DRAPES) ×2
DRAPE POUCH INSTRU U-SHP 10X18 (DRAPES) ×2 IMPLANT
DRAPE STERI 35X30 U-POUCH (DRAPES) ×2 IMPLANT
DRAPE SURG 17X23 STRL (DRAPES) ×2 IMPLANT
DRAPE SURG ORHT 6 SPLT 77X108 (DRAPES) ×2 IMPLANT
DRAPE U-SHAPE 47X51 STRL (DRAPES) ×2 IMPLANT
DRSG PAD ABDOMINAL 8X10 ST (GAUZE/BANDAGES/DRESSINGS) ×2 IMPLANT
DURAPREP 26ML APPLICATOR (WOUND CARE) ×2 IMPLANT
ELECT MENISCUS 165MM 90D (ELECTRODE) IMPLANT
ELECT REM PT RETURN 9FT ADLT (ELECTROSURGICAL) ×2
ELECTRODE REM PT RTRN 9FT ADLT (ELECTROSURGICAL) ×1 IMPLANT
FIBERSTICK 2 (SUTURE) IMPLANT
GAUZE XEROFORM 1X8 LF (GAUZE/BANDAGES/DRESSINGS) ×2 IMPLANT
GLOVE BIO SURGEON STRL SZ 6.5 (GLOVE) ×2 IMPLANT
GLOVE BIO SURGEON STRL SZ7.5 (GLOVE) ×2 IMPLANT
GLOVE ECLIPSE 6.0 STRL STRAW (GLOVE) ×2 IMPLANT
GLOVE INDICATOR 8.0 STRL GRN (GLOVE) ×4 IMPLANT
GLOVE SURG ORTHO 8.0 STRL STRW (GLOVE) ×2 IMPLANT
GOWN PREVENTION PLUS LG XLONG (DISPOSABLE) ×2 IMPLANT
GOWN STRL REIN XL XLG (GOWN DISPOSABLE) ×4 IMPLANT
KIT SHOULDER TRACTION (DRAPES) ×2 IMPLANT
LASSO SUT 90 DEGREE (SUTURE) IMPLANT
NEEDLE 1/2 CIR CATGUT .05X1.09 (NEEDLE) IMPLANT
NEEDLE HYPO 22GX1.5 SAFETY (NEEDLE) ×2 IMPLANT
NEEDLE SCORPION (NEEDLE) IMPLANT
NEEDLE SPNL 18GX3.5 QUINCKE PK (NEEDLE) ×2 IMPLANT
NS IRRIG 500ML POUR BTL (IV SOLUTION) IMPLANT
PACK BASIN DAY SURGERY FS (CUSTOM PROCEDURE TRAY) ×2 IMPLANT
PENCIL BUTTON HOLSTER BLD 10FT (ELECTRODE) IMPLANT
SET ARTHROSCOPY TUBING (MISCELLANEOUS) ×1
SET ARTHROSCOPY TUBING LN (MISCELLANEOUS) ×1 IMPLANT
SLEEVE SURGEON STRL (DRAPES) ×2 IMPLANT
SLING ARM FOAM STRAP LRG (SOFTGOODS) ×2 IMPLANT
SLING ULTRA II AB L (ORTHOPEDIC SUPPLIES) IMPLANT
SLING ULTRA II AB S (ORTHOPEDIC SUPPLIES) IMPLANT
SPONGE GAUZE 4X4 12PLY (GAUZE/BANDAGES/DRESSINGS) ×2 IMPLANT
SPONGE LAP 4X18 X RAY DECT (DISPOSABLE) IMPLANT
SUCTION FRAZIER TIP 10 FR DISP (SUCTIONS) IMPLANT
SUT ETHILON 2 0 PS N (SUTURE) ×2 IMPLANT
SUT LASSO 45 DEGREE LEFT (SUTURE) IMPLANT
SUT LASSO 45D RIGHT (SUTURE) IMPLANT
SUT PDS AB 1 CT1 27 (SUTURE) IMPLANT
SUT VIC AB 0 CT1 36 (SUTURE) IMPLANT
SUT VIC AB 2-0 CT1 27 (SUTURE)
SUT VIC AB 2-0 CT1 TAPERPNT 27 (SUTURE) IMPLANT
SYR 20CC LL (SYRINGE) ×2 IMPLANT
SYR CONTROL 10ML LL (SYRINGE) IMPLANT
TAPE HYPAFIX 6X30 (GAUZE/BANDAGES/DRESSINGS) ×2 IMPLANT
TOWEL OR 17X24 6PK STRL BLUE (TOWEL DISPOSABLE) ×4 IMPLANT
TUBE CONNECTING 12X1/4 (SUCTIONS) ×4 IMPLANT
WAND 90 DEG TURBOVAC W/CORD (SURGICAL WAND) ×2 IMPLANT
WATER STERILE IRR 500ML POUR (IV SOLUTION) ×2 IMPLANT
YANKAUER SUCT BULB TIP NO VENT (SUCTIONS) IMPLANT

## 2011-09-10 NOTE — Op Note (Signed)
Preop diagnosis left shoulder possible labral tear impingement syndrome possible rotator cuff tear a.c. joint arthritis Postop diagnosis left shoulder type I tear superior labrum. Intact biceps tendon, partial thickness supraspinatous tear 30-40%. A.c. arthritis severe subacromial bursitis Procedure 1 left shoulder intra-articular arthroscopy glenohumeral joint with labral debridement. #2 subacromial debridement with bursectomy acromioplasty [place] labral release with debridement of partial cuff tear and #3 arthroscopic distal clavicle resection [place] teaming him for the procedure Surgeon Valma Cava M.D. Midge Aver PA-C Surgeon anesthesia Gen. tendon with interscalene block Guessed about loss less than 50 cc Drains none Complications none Disposition PACU stable  Operative details Patient was counseled in the holding area cracks identified and marked and signed appropriately chart reviewed and signed appropriately interscalene block administered. Although inoperative IV Ancef was given. In the OR she has or general anesthesia Jennie placed the right lower lateral decubitus position probably padded. Abduction to go forward 04 range of motion stable. Chief prepped with DuraPrep draped into a sterile fashion. Overhead shoulder positioner was utilized at 30 of abduction 10 forward flexion and 10 pounds of longitudinal traction. Comment was done done and confirmed all involved and room prior to draping. Portal was crated [place] posterior the glenohumeral joint diagnostic arthroscopy revealed a relatively intact rotator cuff with some mild drainage applied surgeon very mild intact at the biceps tendon to cartilage of the humeral head was impacted Lorcet mild softening the laboratory type I tear superiorly notify detachment of the biceps but some fraying and wear and it region. Anterior portal made an outside in technique the rotator cuff interval merchant shows induces regret healthy tissues go down the  cautery system ingrowth cup breakup to see the labrum only. Tsao retrograded back to healthy edges was noted stable. [employer] was was present subacromial region very thick subacromial bursa was encountered lateral portal was established her questions were protected Nerve bursectomy was performed motorize shaver [employer] system review was released periosteally Meshed a type II, 3 acromion morphology. Burs and placed posteriorly anterior inferior acromioplasty was performed convert her to a flat ankle morphology a.c. joint remarking osteoarthritic approach placed anterior and the lateral 5-8 mm of the topic was removed was removed circumferentially the the superior capsule intact there was a degeneration distal clavicle was resected back to healthy bone is felt to be stable of sculptor removed hemostasis was obtained. Or damaging the rotator cuff surface the [employer] 100 hepatitis at a lightly. As part at 30-40% partial tear the rotator cuff draped in a typical chromic coracoacromial arch consistent extrinsic impingement. Lateral scope was removed and a traction postpone Lahey pulses were intact the portal was 4 nylon suture and no further anesthetic was blood in the case sterile dressings applied flawless length she was turned supine awakened and taken operative stable condition. No complications or problems. She restabilize the PACU discharge to home.  Dealt with patient positioning prepping draping and it was surgical assistance throughout the entire case wound closure patient dressing and sling Mr. Nadine Counts [facility] assistance was needed throughout the entire case

## 2011-09-10 NOTE — Transfer of Care (Signed)
Immediate Anesthesia Transfer of Care Note  Patient: Debbie Hayes  Procedure(s) Performed: Procedure(s) (LRB): SHOULDER ARTHROSCOPY WITH SUBACROMIAL DECOMPRESSION, ROTATOR CUFF REPAIR AND BICEP TENDON REPAIR (Left)  Patient Location: PACU  Anesthesia Type: General  Level of Consciousness: awake, alert  and oriented  Airway & Oxygen Therapy: Patient Spontanous Breathing and Patient connected to nasal cannula oxygen  Post-op Assessment: Report given to PACU RN  Post vital signs: Reviewed and stable  Complications: No apparent anesthesia complications

## 2011-09-10 NOTE — Anesthesia Procedure Notes (Addendum)
Anesthesia Regional Block:  Interscalene brachial plexus block  Pre-Anesthetic Checklist: ,, timeout performed, Correct Patient, Correct Site, Correct Laterality, Correct Procedure, Correct Position, site marked, Risks and benefits discussed,  Surgical consent,  Pre-op evaluation,  At surgeon's request and post-op pain management   Prep: chloraprep       Needles:  Injection technique: Single-shot  Needle Type: Stimiplex          Additional Needles:  Procedures: ultrasound guided and nerve stimulator Interscalene brachial plexus block Narrative:  Injection made incrementally with aspirations every 5 mL.  Performed by: Personally  Anesthesiologist: Consuela Widener  Additional Notes: Risks, benefits and alternative to block explained extensively.  No pain on injection. No increased resistance to injection.  Motor intact immediately after block. Loss of deltoid function at 20 minutes.   Patient tolerated procedure well, without complications.  Supraclavicular block Procedure Name: Intubation Performed by: Maris Berger Pre-anesthesia Checklist: Patient identified, Emergency Drugs available, Suction available and Patient being monitored Patient Re-evaluated:Patient Re-evaluated prior to inductionOxygen Delivery Method: Circle System Utilized Preoxygenation: Pre-oxygenation with 100% oxygen Intubation Type: IV induction Ventilation: Mask ventilation without difficulty Laryngoscope Size: Mac and 3 Grade View: Grade I Tube type: Oral Tube size: 7.0 mm Number of attempts: 1 Airway Equipment and Method: stylet Placement Confirmation: ETT inserted through vocal cords under direct vision,  positive ETCO2 and breath sounds checked- equal and bilateral Secured at: 21 cm Tube secured with: Tape Dental Injury: Teeth and Oropharynx as per pre-operative assessment

## 2011-09-10 NOTE — H&P (Cosign Needed)
Debbie Hayes is an 61 y.o. female.   Chief Complaint: Left shoulder pain HPI: 61 year old female well known to Dr. Thomasena Edis for evaluation left shoulder pain right which her rate and includes x-rays and MRI. Patient failed conservative treatment. Patient continues to have pain. Additionally like to proceed with surgery which is of left shoulder arthroscopy evaluation SAD DCR possible rotator cuff repair with possible biceps tenotomy  Past Medical History  Diagnosis Date  . Rotator cuff disorder LEFT SHOULDER RTC IMPINGMENT  . Hyperlipidemia   . Normal nuclear stress test 01-25-2009  . Disorder of inner ear CAUSES VERTIGO OCCASIONALLY  . Chronic migraine BOTOX INJECTION EVERY 3 MONTHS  . Fibromyalgia   . Hemorrhoid   . Constipation   . Spondylolisthesis of lumbar region   . GERD (gastroesophageal reflux disease)   . Hypothyroidism   . History of thyroid cancer 2009  S/P TOTAL THYROIDECTOMY AND RADIATION  . Diabetes mellitus   . 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 Insomnia   . PONV (postoperative nausea and vomiting) SEVERE  . Chronic pain in right shoulder   . Left shoulder pain   . OA (osteoarthritis) JOINTS  . Hypertension CARDIOLOGIST- DR Donnie Aho- WILL REQUEST LATE NOTE    DENIES S & S    Past Surgical History  Procedure Date  . Right knee closed manipulation 09-11-2010  . Total knee arthroplasty 07-16-2010    RIGHT  . Right knee arthroscopy/ partial lateral menisectomy/ tricompartment chondroplasty/ decompression cyst 10-26-2009  . Right knee arthroscopy X2  BEFORE 2011  . Total thyroidectomy 2009    CANCER  (POST-OP BLEED)  AND RADIATION [place]  . Left thumb joint replacement 2005    RIGHT DONE IN 2004  . Right shoulder arthroscopy 2010  &  2004  . Bilateral elbow surg. 1999  . Bilateral carpal tunnel release 1994  . Vaginal hysterectomy 1990  . Bilateral salpingoophectomy 1993    POST-OP URETER REPAIR 12 DAYS AFTER   . Tonsillectomy 1968  . Tibia cyst removed  and orif leg fx 1958    History reviewed. No pertinent family history. Social History:  reports that she has never smoked. She has never used smokeless tobacco. She reports that she drinks alcohol. She reports that she does not use illicit drugs.  Allergies:  Allergies  Allergen Reactions  . Morphine And Related Nausea And Vomiting and Other (See Comments)    SEVERE N/V AND MIGRAINES  . Sulfa Antibiotics Hives    Medications Prior to Admission  Medication Dose Route Frequency Provider Last Rate Last Dose  . 0.9 %  sodium chloride infusion   Intravenous Continuous Jamelle Rushing, PA      . ceFAZolin (ANCEF) IVPB 2 g/50 mL premix  2 g Intravenous 60 min Pre-Op Jamelle Rushing, PA      . fentaNYL (SUBLIMAZE) injection 50 mcg  50 mcg Intravenous BID Azell Der, MD   50 mcg at 09/10/11 1209  . lactated ringers infusion   Intravenous Continuous Jill Side, MD 50 mL/hr at 09/10/11 1130    . midazolam (VERSED) injection 1 mg  1 mg Intravenous BID Azell Der, MD   1 mg at 09/10/11 1209  . povidone-iodine (BETADINE) 7.5 % scrub   Topical Once Jamelle Rushing, PA       Medications Prior to Admission  Medication Sig Dispense Refill  . acetaminophen (TYLENOL) 650 MG CR tablet Take 650 mg by mouth every 8 (eight) hours as needed.      Marland Kitchen  aspirin 81 MG tablet Take 81 mg by mouth daily.      . cetirizine (ZYRTEC) 10 MG tablet Take 10 mg by mouth daily.      Marland Kitchen eletriptan (RELPAX) 40 MG tablet One tablet by mouth as needed for migraine headache.  If the headache improves and then returns, dose may be repeated after 2 hours have elapsed since first dose (do not exceed 80 mg per day). may repeat in 2 hours if necessary      . esomeprazole (NEXIUM) 40 MG capsule Take 40 mg by mouth as needed.      Marland Kitchen ibuprofen (ADVIL,MOTRIN) 200 MG tablet Take 200 mg by mouth every 6 (six) hours as needed.      Marland Kitchen levothyroxine (SYNTHROID, LEVOTHROID) 150 MCG tablet Take 150 mcg by mouth every evening.      .  lidocaine (LIDODERM) 5 % Place 1 patch onto the skin as needed. Remove & Discard patch within 12 hours or as directed by MD      . losartan (COZAAR) 25 MG tablet Take 25 mg by mouth every evening.      . metaxalone (SKELAXIN) 800 MG tablet Take 800 mg by mouth as needed.      . Methylcellulose, Laxative, (CITRUCEL PO) Take 600 mg by mouth 2 (two) times daily.      . metoprolol tartrate (LOPRESSOR) 12.5 mg TABS Take 12.5 mg by mouth every morning.      . OnabotulinumtoxinA (BOTOX IJ) Inject as directed. Every 3 months for migraines      . polyethylene glycol (MIRALAX / GLYCOLAX) packet Take 17 g by mouth as needed.      . sitaGLIPtin (JANUVIA) 100 MG tablet Take 100 mg by mouth every morning.      . traZODone (DESYREL) 100 MG tablet Take 100 mg by mouth at bedtime.      . triamcinolone (NASACORT) 55 MCG/ACT nasal inhaler Place 2 sprays into the nose as needed.        Results for orders placed during the hospital encounter of 09/10/11 (from the past 48 hour(s))  POCT I-STAT 4, (NA,K, GLUC, HGB,HCT)     Status: Abnormal   Collection Time   09/10/11 11:32 AM      Component Value Range Comment   Sodium 141  135 - 145 (mEq/L)    Potassium 4.1  3.5 - 5.1 (mEq/L)    Glucose, Bld 120 (*) 70 - 99 (mg/dL)    HCT 09.8  11.9 - 14.7 (%)    Hemoglobin 14.6  12.0 - 15.0 (g/dL)    No results found.  Review of Systems  Constitutional: Negative.   HENT: Negative.   Eyes: Negative.   Respiratory: Negative.   Cardiovascular: Negative.   Gastrointestinal: Negative.     Blood pressure 135/82, pulse 70, temperature 97.3 F (36.3 C), temperature source Oral, resp. rate 19, height 5\' 7"  (1.702 m), weight 82.555 kg (182 lb), SpO2 94.00%. Physical Exam  patient is conscious alert and appropriate well-developed female appears to be in no distress heart is regular rate and rhythm abdomen soft nontender bowel sounds present lungs are clear throughout her left shoulder is weak to do interscalene block on the  left   Assessment/Plan Left shoulder pain possible rotator cuff tear biceps tendinitis with impingement At this time patient would like to proceed with a left shoulder scope for about possible rotator cuff repair SAD DCR with possible labral debridement and biceps tenotomy. Patient has discussed pros and cons with  Dr. Thomasena Edis and like to proceed  Veleta Yamamoto ANDREW 09/10/2011, 1:08 PM

## 2011-09-10 NOTE — H&P (Signed)
Debbie Hayes is an 61 y.o. female.   Chief Complaint: Left shoulder pain HPI: 61 year old female well known to Dr. Thomasena Edis for evaluation left shoulder pain right which her rate and includes x-rays and MRI. Patient failed conservative treatment. Patient continues to have pain. Additionally like to proceed with surgery which is of left shoulder arthroscopy evaluation SAD DCR possible rotator cuff repair with possible biceps tenotomy  Past Medical History  Diagnosis Date  . Rotator cuff disorder LEFT SHOULDER RTC IMPINGMENT  . Hyperlipidemia   . Normal nuclear stress test 01-25-2009  . Disorder of inner ear CAUSES VERTIGO OCCASIONALLY  . Chronic migraine BOTOX INJECTION EVERY 3 MONTHS  . Fibromyalgia   . Hemorrhoid   . Constipation   . Spondylolisthesis of lumbar region   . GERD (gastroesophageal reflux disease)   . Hypothyroidism   . History of thyroid cancer 2009  S/P TOTAL THYROIDECTOMY AND RADIATION  . Diabetes mellitus   . 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 Insomnia   . PONV (postoperative nausea and vomiting) SEVERE  . Chronic pain in right shoulder   . Left shoulder pain   . OA (osteoarthritis) JOINTS  . Hypertension CARDIOLOGIST- DR Donnie Aho- WILL REQUEST LATE NOTE    DENIES S & S    Past Surgical History  Procedure Date  . Right knee closed manipulation 09-11-2010  . Total knee arthroplasty 07-16-2010    RIGHT  . Right knee arthroscopy/ partial lateral menisectomy/ tricompartment chondroplasty/ decompression cyst 10-26-2009  . Right knee arthroscopy X2  BEFORE 2011  . Total thyroidectomy 2009    CANCER  (POST-OP BLEED)  AND RADIATION [place]  . Left thumb joint replacement 2005    RIGHT DONE IN 2004  . Right shoulder arthroscopy 2010  &  2004  . Bilateral elbow surg. 1999  . Bilateral carpal tunnel release 1994  . Vaginal hysterectomy 1990  . Bilateral salpingoophectomy 1993    POST-OP URETER REPAIR 12 DAYS AFTER   . Tonsillectomy 1968  . Tibia cyst removed  and orif leg fx 1958    History reviewed. No pertinent family history. Social History:  reports that she has never smoked. She has never used smokeless tobacco. She reports that she drinks alcohol. She reports that she does not use illicit drugs.  Allergies:  Allergies  Allergen Reactions  . Morphine And Related Nausea And Vomiting and Other (See Comments)    SEVERE N/V AND MIGRAINES  . Sulfa Antibiotics Hives    Medications Prior to Admission  Medication Dose Route Frequency Provider Last Rate Last Dose  . 0.9 %  sodium chloride infusion   Intravenous Continuous Jamelle Rushing, PA      . ceFAZolin (ANCEF) IVPB 2 g/50 mL premix  2 g Intravenous 60 min Pre-Op Jamelle Rushing, PA      . fentaNYL (SUBLIMAZE) injection 50 mcg  50 mcg Intravenous BID Azell Der, MD   50 mcg at 09/10/11 1209  . lactated ringers infusion   Intravenous Continuous Jill Side, MD 50 mL/hr at 09/10/11 1130    . midazolam (VERSED) injection 1 mg  1 mg Intravenous BID Azell Der, MD   1 mg at 09/10/11 1209  . povidone-iodine (BETADINE) 7.5 % scrub   Topical Once Jamelle Rushing, PA       Medications Prior to Admission  Medication Sig Dispense Refill  . acetaminophen (TYLENOL) 650 MG CR tablet Take 650 mg by mouth every 8 (eight) hours as needed.      Marland Kitchen  aspirin 81 MG tablet Take 81 mg by mouth daily.      . cetirizine (ZYRTEC) 10 MG tablet Take 10 mg by mouth daily.      Marland Kitchen eletriptan (RELPAX) 40 MG tablet One tablet by mouth as needed for migraine headache.  If the headache improves and then returns, dose may be repeated after 2 hours have elapsed since first dose (do not exceed 80 mg per day). may repeat in 2 hours if necessary      . esomeprazole (NEXIUM) 40 MG capsule Take 40 mg by mouth as needed.      Marland Kitchen ibuprofen (ADVIL,MOTRIN) 200 MG tablet Take 200 mg by mouth every 6 (six) hours as needed.      Marland Kitchen levothyroxine (SYNTHROID, LEVOTHROID) 150 MCG tablet Take 150 mcg by mouth every evening.      .  lidocaine (LIDODERM) 5 % Place 1 patch onto the skin as needed. Remove & Discard patch within 12 hours or as directed by MD      . losartan (COZAAR) 25 MG tablet Take 25 mg by mouth every evening.      . metaxalone (SKELAXIN) 800 MG tablet Take 800 mg by mouth as needed.      . Methylcellulose, Laxative, (CITRUCEL PO) Take 600 mg by mouth 2 (two) times daily.      . metoprolol tartrate (LOPRESSOR) 12.5 mg TABS Take 12.5 mg by mouth every morning.      . OnabotulinumtoxinA (BOTOX IJ) Inject as directed. Every 3 months for migraines      . polyethylene glycol (MIRALAX / GLYCOLAX) packet Take 17 g by mouth as needed.      . sitaGLIPtin (JANUVIA) 100 MG tablet Take 100 mg by mouth every morning.      . traZODone (DESYREL) 100 MG tablet Take 100 mg by mouth at bedtime.      . triamcinolone (NASACORT) 55 MCG/ACT nasal inhaler Place 2 sprays into the nose as needed.        No results found for this or any previous visit (from the past 48 hour(s)). No results found.  Review of Systems  Constitutional: Negative.   HENT: Negative.   Eyes: Negative.   Respiratory: Negative.   Cardiovascular: Negative.   Gastrointestinal: Negative.     Blood pressure 135/82, pulse 70, temperature 97.3 F (36.3 C), temperature source Oral, resp. rate 19, height 5\' 7"  (1.702 m), weight 82.555 kg (182 lb), SpO2 94.00%. Physical Exam patient is conscious alert and appropriate well-developed female appears to be in no distress heart is regular rate and rhythm abdomen soft nontender bowel sounds present lungs are clear throughout her left shoulder is weak to do interscalene block on the left   Assessment/Plan Left shoulder pain possible rotator cuff tear biceps tendinitis with impingement At this time patient would like to proceed with a left shoulder scope for about possible rotator cuff repair SAD DCR with possible labral debridement and biceps tenotomy. Patient has discussed pros and cons with Dr. Thomasena Edis and like to  proceed  Jamelle Rushing 09/10/2011, 12:55 PM

## 2011-09-10 NOTE — Anesthesia Preprocedure Evaluation (Addendum)
Anesthesia Evaluation  Patient identified by MRN, date of birth, ID band Patient awake    Reviewed: Allergy & Precautions, H&P , NPO status , Patient's Chart, lab work & pertinent test results  History of Anesthesia Complications (+) PONV  Airway Mallampati: II TM Distance: >3 FB Neck ROM: Full    Dental No notable dental hx.    Pulmonary neg pulmonary ROS,  clear to auscultation  Pulmonary exam normal       Cardiovascular hypertension, Pt. on home beta blockers Regular Normal    Neuro/Psych Left facial numbness last 11/12. Normal MRA. No evidence of cerebrovascular disease. Tingling left upper lip thought secondary to cipro in [place]  Neuromuscular disease Negative Psych ROS   GI/Hepatic Neg liver ROS, GERD-  Medicated,  Endo/Other  Diabetes mellitus-, Type 2, Oral Hypoglycemic AgentsHypothyroidism   Renal/GU negative Renal ROS  Genitourinary negative   Musculoskeletal  (+) Fibromyalgia -  Abdominal   Peds negative pediatric ROS (+)  Hematology negative hematology ROS (+)   Anesthesia Other Findings   Reproductive/Obstetrics negative OB ROS                          Anesthesia Physical Anesthesia Plan  ASA: III  Anesthesia Plan: General   Post-op Pain Management:    Induction: Intravenous  Airway Management Planned: Oral ETT  Additional Equipment:   Intra-op Plan:   Post-operative Plan: Extubation in OR  Informed Consent: I have reviewed the patients History and Physical, chart, labs and discussed the procedure including the risks, benefits and alternatives for the proposed anesthesia with the patient or authorized representative who has indicated his/her understanding and acceptance.   Dental advisory given  Plan Discussed with: CRNA  Anesthesia Plan Comments: (Discussed risks and benefits of interscalene block including failure, bleeding, infection, nerve damage, weakness.  Questions answered. Patient consents to block.)        Anesthesia Quick Evaluation

## 2011-09-11 NOTE — Anesthesia Postprocedure Evaluation (Signed)
  Anesthesia Post-op Note  Patient: Debbie Hayes  Procedure(s) Performed: Procedure(s) (LRB): SHOULDER ARTHROSCOPY WITH SUBACROMIAL DECOMPRESSION, ROTATOR CUFF REPAIR AND BICEP TENDON REPAIR (Left)  Patient Location: PACU  Anesthesia Type: GA combined with regional for post-op pain  Level of Consciousness: oriented and sedated  Airway and Oxygen Therapy: Patient Spontanous Breathing and Patient connected to nasal cannula oxygen  Post-op Pain: mild  Post-op Assessment: Post-op Vital signs reviewed, Patient's Cardiovascular Status Stable, Respiratory Function Stable and Patent Airway  Post-op Vital Signs: stable  Complications: No apparent anesthesia complications

## 2011-09-18 ENCOUNTER — Ambulatory Visit: Payer: 59 | Admitting: Physical Therapy

## 2011-09-19 ENCOUNTER — Ambulatory Visit: Payer: 59 | Admitting: Physical Therapy

## 2011-09-22 ENCOUNTER — Ambulatory Visit: Payer: 59 | Admitting: Physical Therapy

## 2011-10-06 ENCOUNTER — Telehealth: Payer: Self-pay

## 2011-10-06 ENCOUNTER — Other Ambulatory Visit: Payer: Self-pay | Admitting: Emergency Medicine

## 2011-10-06 DIAGNOSIS — M25511 Pain in right shoulder: Secondary | ICD-10-CM

## 2011-10-06 NOTE — Telephone Encounter (Signed)
Dr. Cleta Alberts was given patients number and will call her back.

## 2011-10-06 NOTE — Telephone Encounter (Signed)
.  [facility] PT WOULD LIKE DR DAUB TO GIVE HER A CALL WHENEVER HE GETS A CHANCE. PLEASE CALL [phone]

## 2011-10-08 ENCOUNTER — Ambulatory Visit: Payer: 59 | Attending: Specialist | Admitting: Physical Therapy

## 2011-10-08 DIAGNOSIS — IMO0001 Reserved for inherently not codable concepts without codable children: Secondary | ICD-10-CM | POA: Insufficient documentation

## 2011-10-08 DIAGNOSIS — M25519 Pain in unspecified shoulder: Secondary | ICD-10-CM | POA: Insufficient documentation

## 2011-10-08 DIAGNOSIS — M25539 Pain in unspecified wrist: Secondary | ICD-10-CM | POA: Insufficient documentation

## 2011-10-09 ENCOUNTER — Other Ambulatory Visit: Payer: Self-pay | Admitting: Physician Assistant

## 2011-10-14 ENCOUNTER — Ambulatory Visit: Payer: 59 | Admitting: Physical Therapy

## 2011-10-16 ENCOUNTER — Other Ambulatory Visit: Payer: Self-pay | Admitting: Physician Assistant

## 2011-10-16 ENCOUNTER — Ambulatory Visit: Payer: 59 | Admitting: Physical Therapy

## 2011-10-20 ENCOUNTER — Ambulatory Visit: Payer: 59 | Admitting: Physical Therapy

## 2011-10-29 ENCOUNTER — Ambulatory Visit: Payer: 59 | Admitting: Physical Therapy

## 2011-11-03 ENCOUNTER — Ambulatory Visit: Payer: 59 | Admitting: Physical Therapy

## 2011-11-04 ENCOUNTER — Ambulatory Visit (INDEPENDENT_AMBULATORY_CARE_PROVIDER_SITE_OTHER): Payer: 59 | Admitting: Physician Assistant

## 2011-11-04 VITALS — BP 113/68 | HR 83 | Temp 98.0°F | Resp 16 | Ht 67.5 in | Wt 188.0 lb

## 2011-11-04 DIAGNOSIS — R3915 Urgency of urination: Secondary | ICD-10-CM

## 2011-11-04 DIAGNOSIS — N390 Urinary tract infection, site not specified: Secondary | ICD-10-CM

## 2011-11-04 DIAGNOSIS — R350 Frequency of micturition: Secondary | ICD-10-CM

## 2011-11-04 DIAGNOSIS — R301 Vesical tenesmus: Secondary | ICD-10-CM

## 2011-11-04 DIAGNOSIS — R3989 Other symptoms and signs involving the genitourinary system: Secondary | ICD-10-CM

## 2011-11-04 LAB — POCT URINALYSIS DIPSTICK
Bilirubin, UA: NEGATIVE
Blood, UA: NEGATIVE
Glucose, UA: NEGATIVE
Leukocytes, UA: NEGATIVE
Nitrite, UA: NEGATIVE
Protein, UA: NEGATIVE
Spec Grav, UA: 1.01
pH, UA: 5.5

## 2011-11-04 LAB — POCT UA - MICROSCOPIC ONLY
Bacteria, U Microscopic: NEGATIVE
Mucus, UA: NEGATIVE
WBC, Ur, HPF, POC: NEGATIVE
Yeast, UA: NEGATIVE

## 2011-11-04 MED ORDER — METAXALONE 800 MG PO TABS: 800.0000 mg | ORAL_TABLET | ORAL | Status: DC | PRN

## 2011-11-04 NOTE — Progress Notes (Signed)
  Subjective:    Patient ID: Debbie Hayes, female    DOB: May 11, 1951, 61 y.o.   MRN: [id number]  HPI 61yo CF c/o intermittent urinary urgency and recurrent urinary tract infections.  She has seen a urologist as recently as 2011 that has allowed her to treat her own UTI.  Since February she has had the following (brought printed out paper): Feb 14-21-she toolk macrobid and seemed better ~ 1 week Feb 28th-symptoms seemed to return. She took cipro which her symptoms didn't respond to. March 6-12th she was still having symtoms and switched back to macrobid for 7 days March 12-20th-minimum symptoms, no meds. March 20-26-Took trimethoprim for 6 days, continued to have symptoms, and urine was cloudy at times March 27-current-on macrobid. Still having symptoms Drinks ~60 ounces of water/day.  Symptoms consist of urinary urgency, sometimes frequency, and a sense of incomplete voiding.   Just saw endocrinologist and no signs of thyroid CA.  Diabetes is ok. Review of Systems  All other systems reviewed and are negative.      Objective:   Physical Exam  Nursing note and vitals reviewed. Constitutional: She is oriented to person, place, and time. She appears well-developed and well-nourished.  Cardiovascular: Normal rate, regular rhythm and normal heart sounds.  Exam reveals no gallop and no friction rub.   No murmur heard. Pulmonary/Chest: Effort normal and breath sounds normal.  Abdominal:       No CVA tenderness  Neurological: She is alert and oriented to person, place, and time.  Skin: Skin is warm and dry.     Results for orders placed in visit on 11/04/11  POCT UA - MICROSCOPIC ONLY      Component Value Range   WBC, Ur, HPF, POC neg     RBC, urine, microscopic neg     Bacteria, U Microscopic neg     Mucus, UA neg     Epithelial cells, urine per micros neg     Crystals, Ur, HPF, POC neg     Casts, Ur, LPF, POC neg     Yeast, UA neg    POCT URINALYSIS DIPSTICK      Component  Value Range   Color, UA yellow     Clarity, UA clear     Glucose, UA neg     Bilirubin, UA neg     Ketones, UA neg     Spec Grav, UA 1.010     Blood, UA neg     pH, UA 5.5     Protein, UA neg     Urobilinogen, UA 0.2     Nitrite, UA neg     Leukocytes, UA Negative            Assessment & Plan:  Urinary symptoms/bladder spasms/?interstitial cystitis-Urine 100% normal today.  Gave prescription for urine test strips for now bc patient wishes to hold off on referral/urology work up.  D/C macrobid (and all others for now) and avoid treating unless there is evidence of infection.  Urine sent for culture.  I believe something going on such as incomplete voiding/urinary retention/or maybe interstitial cystitis. Per patient request, sent Skelaxin Rx to pharmacy.  Gave info for Dr. Hillis Range if she decides to see him.  I advised this is a good idea if the above symptoms persist without evidence of infection.

## 2011-11-06 LAB — URINE CULTURE
Colony Count: NO GROWTH
Organism ID, Bacteria: NO GROWTH

## 2011-11-14 ENCOUNTER — Ambulatory Visit (INDEPENDENT_AMBULATORY_CARE_PROVIDER_SITE_OTHER): Payer: 59 | Admitting: Family Medicine

## 2011-11-14 VITALS — BP 104/66 | HR 84 | Temp 98.2°F | Resp 16 | Ht 67.5 in | Wt 187.2 lb

## 2011-11-14 DIAGNOSIS — N390 Urinary tract infection, site not specified: Secondary | ICD-10-CM

## 2011-11-14 LAB — POCT URINALYSIS DIPSTICK
Bilirubin, UA: NEGATIVE
Glucose, UA: NEGATIVE
Nitrite, UA: POSITIVE
Urobilinogen, UA: 1

## 2011-11-14 LAB — POCT UA - MICROSCOPIC ONLY
Casts, Ur, LPF, POC: NEGATIVE
Mucus, UA: NEGATIVE
Yeast, UA: NEGATIVE

## 2011-11-14 MED ORDER — NITROFURANTOIN MONOHYD MACRO 100 MG PO CAPS: 100.0000 mg | ORAL_CAPSULE | Freq: Two times a day (BID) | ORAL | Status: DC

## 2011-11-14 MED ORDER — NITROFURANTOIN MONOHYD MACRO 100 MG PO CAPS: 100.0000 mg | ORAL_CAPSULE | Freq: Two times a day (BID) | ORAL | Status: AC

## 2011-11-14 NOTE — Progress Notes (Signed)
  Subjective:    Patient ID: Debbie Hayes, female    DOB: 1951/06/12, 61 y.o.   MRN: [id number]  HPI  Patient presents with histoory of recurrent UTI's. Has seen urologist in the past([place]) Persistant dysuria without pyuria. In the last 24 hours increase in dysuria, frequency and urgency Denies fever/chills or N/V Using pyridium   Review of Systems     Objective:   Physical Exam  Constitutional: She appears well-developed.  HENT:  Mouth/Throat: Oropharynx is clear and moist.  Neck: Neck supple.  Cardiovascular: Normal rate, regular rhythm and normal heart sounds.   Pulmonary/Chest: Effort normal and breath sounds normal.  Abdominal: Soft. Bowel sounds are normal. There is no hepatosplenomegaly. There is no CVA tenderness.  Neurological: She is alert.  Skin: Skin is warm.          Assessment & Plan:   1. UTI (urinary tract infection)  POCT UA - Microscopic Only, POCT urinalysis dipstick, Urine culture, Ambulatory referral to Urology, nitrofurantoin, macrocrystal-monohydrate, (MACROBID) 100 MG capsule,

## 2011-11-17 LAB — URINE CULTURE: Colony Count: >=100000

## 2011-12-24 ENCOUNTER — Ambulatory Visit (INDEPENDENT_AMBULATORY_CARE_PROVIDER_SITE_OTHER): Payer: 59 | Admitting: Emergency Medicine

## 2011-12-24 DIAGNOSIS — E119 Type 2 diabetes mellitus without complications: Secondary | ICD-10-CM

## 2011-12-24 MED ORDER — TRAZODONE HCL 100 MG PO TABS: ORAL_TABLET | ORAL | Status: DC

## 2011-12-24 MED ORDER — LEVOTHYROXINE SODIUM 150 MCG PO TABS: 150.0000 ug | ORAL_TABLET | Freq: Every evening | ORAL | Status: DC

## 2011-12-24 MED ORDER — METOPROLOL SUCCINATE ER 25 MG PO TB24: ORAL_TABLET | ORAL | Status: DC

## 2011-12-24 MED ORDER — ESTROGENS, CONJUGATED 0.625 MG/GM VA CREA: 1.0000 g | TOPICAL_CREAM | VAGINAL | Status: DC

## 2011-12-24 MED ORDER — SITAGLIPTIN PHOSPHATE 100 MG PO TABS: 100.0000 mg | ORAL_TABLET | Freq: Every morning | ORAL | Status: DC

## 2011-12-24 MED ORDER — LOSARTAN POTASSIUM 50 MG PO TABS: ORAL_TABLET | ORAL | Status: DC

## 2011-12-24 NOTE — Progress Notes (Signed)
  Subjective:    Patient ID: Debbie Hayes, female    DOB: Oct 01, 1950, 61 y.o.   MRN: [id number]  HPI patient to followup on her diabetes. Sugars have been borderline high recently. She has not been exercising much he she also needs an up for a bone density study.    Review of Systems overall doing well without specific complaints the     Objective:   Physical Exam she has had a thyroidectomy. No masses palpable in the neck. Her chest is clear to auscultation and percussion. Her cardiac exam is unremarkable.  Results for orders placed in visit on 12/24/11  POCT GLYCOSYLATED HEMOGLOBIN (HGB A1C)      Component Value Range   Hemoglobin A1C 6.4        Assessment & Plan:  Diabetes mellitus appears to be stable at present. We'll schedule a bone density study at [facility]. We'll also check hemoglobin A1c.

## 2011-12-29 ENCOUNTER — Other Ambulatory Visit: Payer: Self-pay | Admitting: Physician Assistant

## 2011-12-29 MED ORDER — ESTROGENS, CONJUGATED 0.625 MG/GM VA CREA: 1.0000 g | TOPICAL_CREAM | VAGINAL | Status: DC

## 2012-01-02 ENCOUNTER — Other Ambulatory Visit: Payer: Self-pay | Admitting: Emergency Medicine

## 2012-01-02 DIAGNOSIS — J309 Allergic rhinitis, unspecified: Secondary | ICD-10-CM

## 2012-01-02 MED ORDER — TRIAMCINOLONE ACETONIDE(NASAL) 55 MCG/ACT NA INHA: 2.0000 | NASAL | Status: DC | PRN

## 2012-01-02 MED ORDER — AZELASTINE HCL 0.1 % NA SOLN: 2.0000 | Freq: Two times a day (BID) | NASAL | Status: DC

## 2012-01-26 ENCOUNTER — Ambulatory Visit (INDEPENDENT_AMBULATORY_CARE_PROVIDER_SITE_OTHER): Payer: 59 | Admitting: Family Medicine

## 2012-01-26 ENCOUNTER — Ambulatory Visit: Payer: 59

## 2012-01-26 VITALS — BP 135/80 | HR 76 | Temp 98.2°F | Resp 17 | Ht 67.5 in | Wt 193.0 lb

## 2012-01-26 DIAGNOSIS — G589 Mononeuropathy, unspecified: Secondary | ICD-10-CM

## 2012-01-26 DIAGNOSIS — M79603 Pain in arm, unspecified: Secondary | ICD-10-CM

## 2012-01-26 DIAGNOSIS — M542 Cervicalgia: Secondary | ICD-10-CM

## 2012-01-26 DIAGNOSIS — I10 Essential (primary) hypertension: Secondary | ICD-10-CM

## 2012-01-26 DIAGNOSIS — M79609 Pain in unspecified limb: Secondary | ICD-10-CM

## 2012-01-26 DIAGNOSIS — G629 Polyneuropathy, unspecified: Secondary | ICD-10-CM

## 2012-01-26 DIAGNOSIS — R110 Nausea: Secondary | ICD-10-CM

## 2012-01-26 MED ORDER — ONDANSETRON 4 MG PO TBDP: 4.0000 mg | ORAL_TABLET | Freq: Three times a day (TID) | ORAL | Status: DC | PRN

## 2012-01-26 MED ORDER — LOSARTAN POTASSIUM 25 MG PO TABS: 25.0000 mg | ORAL_TABLET | Freq: Every day | ORAL | Status: DC

## 2012-01-26 MED ORDER — PREDNISONE 20 MG PO TABS: 40.0000 mg | ORAL_TABLET | Freq: Every day | ORAL | Status: AC

## 2012-01-26 NOTE — Patient Instructions (Signed)
Try prednisone,  If not improving, consider neurontin or lyrica.  Let me know, or follow up with Dr. Butler Denmark if symptoms not improving.

## 2012-01-26 NOTE — Progress Notes (Signed)
  Subjective:    Patient ID: Debbie Hayes, female    DOB: 1951-01-21, 61 y.o.   MRN: [id number]  HPI Debbie Hayes is a 61 y.o. female R arm pain - seen in past for R radial tunnel - Dr. Penni Bombard, then [facility].  Had injected.  Plan on waiting a year for symptoms to improve.    New Symptoms of shooting pains up and down arm, x 2 weeks.  R shoulder pain as well - worse this am.  Numbness in 2nd R finger - 4 days, and middle finger numb since yesterday. Tx: 1/2 of percocet7.5 this am, 800mg  ibuprofen this afternoon. (usually 800mg  ibuprofen 1-2x/day)  No known hx of neck surgery - just arthritis. Hx of R shoulder surgery.  No weakness.  DM2 - last A1C 6.5 range in June.    Next appt scheduled in few weeks   Nausea with narcotic or migraine pills.  Every few weeks.    HTN - cutting cozaar in 1/2. Prior at 50 mg dose.    Review of Systems Per HPI. No arm weakness.  No neck pain.     Objective:   Physical Exam  Constitutional: She is oriented to person, place, and time. She appears well-developed and well-nourished.  HENT:  Head: Normocephalic and atraumatic.  Neck: Normal range of motion. Neck supple.  Pulmonary/Chest: Effort normal.  Musculoskeletal: Normal range of motion.       Right shoulder: She exhibits normal range of motion and no tenderness.       Right elbow: She exhibits normal range of motion, no swelling and no effusion. No radial head, no medial epicondyle and no lateral epicondyle tenderness noted.       Cervical back: She exhibits normal range of motion, no tenderness, no bony tenderness, no swelling and no spasm.       Right forearm: She exhibits no tenderness and no bony tenderness.       Arms: Neurological: She is alert and oriented to person, place, and time. She has normal strength. She displays no atrophy. She exhibits normal muscle tone.       Difficult to obtain reflexes bilaterally UE's. strength equal in forearms and hands, able to make ok sign and  finger opposition intact.     Skin: Skin is warm and dry. No erythema.  Psychiatric: She has a normal mood and affect. Her behavior is normal.    [facility] reading (PRIMARY) by  Dr. Neva Seat C-spine: decreased lordosis, spondylosis C4-6.        Assessment & Plan:   Debbie Hayes is a 61 y.o. female 1. HTN (hypertension)  losartan (COZAAR) 25 MG tablet, Basic metabolic panel  2. Nausea  ondansetron (ZOFRAN ODT) 4 MG disintegrating tablet  3. Arm pain  DG Cervical Spine Complete  4. Neuropathy  Basic metabolic panel    R arm pain - with hx of radial tunnel syndrome.  Exacerbation of symptoms with progression of neuropathic pain proximally.  Dysesthesias in C7 distribution.  Trial of prednisone 40mg  x 5 days.  SED, watch blood sugars.  Can use percocet if needed for more severe pain. Discussed Lyrica or Neurontin as neuralgia type of pain. - deferred at present, but will consider if not improving with above.  Recommended follow up with Dr Butler Denmark if not improving with steroid.   Nausea with Pain meds - Zofran 4mg  Q8h prn. # 30 given  Htn - stable - check BMP, new dose cozaar written - 6 months supply.

## 2012-01-27 LAB — BASIC METABOLIC PANEL
BUN: 19 mg/dL (ref 6–23)
Calcium: 9.7 mg/dL (ref 8.4–10.5)
Creat: 0.66 mg/dL (ref 0.50–1.10)
Glucose, Bld: 162 mg/dL — ABNORMAL HIGH (ref 70–99)

## 2012-02-19 ENCOUNTER — Other Ambulatory Visit: Payer: Self-pay | Admitting: Emergency Medicine

## 2012-03-09 ENCOUNTER — Other Ambulatory Visit: Payer: Self-pay | Admitting: Internal Medicine

## 2012-03-09 NOTE — Telephone Encounter (Signed)
Ok x 6 

## 2012-03-10 ENCOUNTER — Other Ambulatory Visit: Payer: Self-pay | Admitting: Radiology

## 2012-03-10 MED ORDER — ELETRIPTAN HYDROBROMIDE 40 MG PO TABS: 40.0000 mg | ORAL_TABLET | ORAL | Status: DC | PRN

## 2012-03-24 ENCOUNTER — Other Ambulatory Visit: Payer: Self-pay | Admitting: Emergency Medicine

## 2012-04-07 ENCOUNTER — Encounter: Payer: Self-pay | Admitting: Emergency Medicine

## 2012-04-12 ENCOUNTER — Ambulatory Visit (INDEPENDENT_AMBULATORY_CARE_PROVIDER_SITE_OTHER): Payer: 59 | Admitting: Emergency Medicine

## 2012-04-12 ENCOUNTER — Ambulatory Visit: Payer: 59

## 2012-04-12 VITALS — BP 105/65 | HR 78 | Temp 98.1°F | Resp 18 | Ht 67.5 in | Wt 194.0 lb

## 2012-04-12 DIAGNOSIS — M1990 Unspecified osteoarthritis, unspecified site: Secondary | ICD-10-CM

## 2012-04-12 DIAGNOSIS — E039 Hypothyroidism, unspecified: Secondary | ICD-10-CM | POA: Insufficient documentation

## 2012-04-12 DIAGNOSIS — B54 Unspecified malaria: Secondary | ICD-10-CM

## 2012-04-12 DIAGNOSIS — N390 Urinary tract infection, site not specified: Secondary | ICD-10-CM

## 2012-04-12 DIAGNOSIS — E119 Type 2 diabetes mellitus without complications: Secondary | ICD-10-CM

## 2012-04-12 DIAGNOSIS — M545 Low back pain, unspecified: Secondary | ICD-10-CM

## 2012-04-12 DIAGNOSIS — G4700 Insomnia, unspecified: Secondary | ICD-10-CM

## 2012-04-12 LAB — COMPREHENSIVE METABOLIC PANEL
ALT: 23 U/L (ref 0–35)
AST: 21 U/L (ref 0–37)
Albumin: 4.6 g/dL (ref 3.5–5.2)
Calcium: 9.6 mg/dL (ref 8.4–10.5)
Chloride: 104 mEq/L (ref 96–112)
Potassium: 4.2 mEq/L (ref 3.5–5.3)

## 2012-04-12 LAB — POCT GLYCOSYLATED HEMOGLOBIN (HGB A1C): Hemoglobin A1C: 7

## 2012-04-12 MED ORDER — DICLOFENAC EPOLAMINE 1.3 % TD PTCH: 1.0000 | MEDICATED_PATCH | Freq: Two times a day (BID) | TRANSDERMAL | Status: DC

## 2012-04-12 MED ORDER — SITAGLIPTIN PHOSPHATE 100 MG PO TABS: 100.0000 mg | ORAL_TABLET | Freq: Every morning | ORAL | Status: DC

## 2012-04-12 MED ORDER — ATOVAQUONE-PROGUANIL HCL 250-100 MG PO TABS: 1.0000 | ORAL_TABLET | Freq: Every day | ORAL | Status: DC

## 2012-04-12 MED ORDER — LEVOTHYROXINE SODIUM 150 MCG PO TABS: 150.0000 ug | ORAL_TABLET | Freq: Every evening | ORAL | Status: DC

## 2012-04-12 MED ORDER — TRAZODONE HCL 150 MG PO TABS: 150.0000 mg | ORAL_TABLET | Freq: Every day | ORAL | Status: DC

## 2012-04-12 NOTE — Progress Notes (Signed)
  Subjective:    Patient ID: Debbie Hayes, female    DOB: [birth year], 61 y.o.   MRN: [id number]  HPI Patient presents today with a follow-up diabetes check. She is wanting a referral for bone density she has had discomfort in her left hip. She has a history of spondylolisthesis L4-L5 has been [facility] for this in the past. She has pain her back and into the left hip. She is asking for refill on her foot patch..   Review of Systems     Objective:   Physical Exam HEENT exam is unremarkable neck supple chest clear heart regular rate no murmurs extremity exam is normal.  [facility] reading (PRIMARY) by  Dr. Cleta Alberts is an L4-L5 approximately three-quarter centimeter spondylolisthesis. There is a 3 mm calcific density in the lower right ureter.   Results for orders placed in visit on 04/12/12  POCT GLYCOSYLATED HEMOGLOBIN (HGB A1C)      Component Value Range   Hemoglobin A1C 7.0    GLUCOSE, POCT (MANUAL RESULT ENTRY)      Component Value Range   POC Glucose 135 (*) 70 - 99 mg/dl       Assessment & Plan:

## 2012-04-27 ENCOUNTER — Ambulatory Visit (INDEPENDENT_AMBULATORY_CARE_PROVIDER_SITE_OTHER): Payer: 59 | Admitting: Family Medicine

## 2012-04-27 DIAGNOSIS — Z23 Encounter for immunization: Secondary | ICD-10-CM

## 2012-05-03 ENCOUNTER — Other Ambulatory Visit: Payer: Self-pay | Admitting: *Deleted

## 2012-05-03 MED ORDER — TRAZODONE HCL 100 MG PO TABS: 100.0000 mg | ORAL_TABLET | ORAL | Status: DC

## 2012-05-03 NOTE — Telephone Encounter (Signed)
Per Dr. Milus Glazier ok to fill Trazadone 100mg  #150

## 2012-05-11 ENCOUNTER — Other Ambulatory Visit: Payer: Self-pay | Admitting: Radiology

## 2012-05-13 ENCOUNTER — Telehealth: Payer: Self-pay | Admitting: Radiology

## 2012-05-13 MED ORDER — TRAZODONE HCL 100 MG PO TABS: ORAL_TABLET | ORAL | Status: DC

## 2012-05-13 NOTE — Telephone Encounter (Signed)
Should not need any trazodone as she just got this refilled. Are we sure about the Patanase? This medication is not on her medication list.

## 2012-05-13 NOTE — Telephone Encounter (Signed)
Please advise onTrazadone, dose has changed according to fax from [facility] pharmacy fax they are indicating patient takes 125 mg dose,. Also request refill on Patanase nasal spray.

## 2012-05-13 NOTE — Telephone Encounter (Signed)
Have corrected sig, sent to [facility], left message for patient about nasal spray.

## 2012-05-17 NOTE — Telephone Encounter (Signed)
Pt in [place].

## 2012-05-17 NOTE — Telephone Encounter (Signed)
Left message on machine to call back regarding nasal spray

## 2012-05-24 ENCOUNTER — Other Ambulatory Visit: Payer: Self-pay | Admitting: *Deleted

## 2012-05-24 MED ORDER — LOSARTAN POTASSIUM 50 MG PO TABS: 50.0000 mg | ORAL_TABLET | Freq: Every day | ORAL | Status: DC

## 2012-06-01 ENCOUNTER — Other Ambulatory Visit: Payer: Self-pay | Admitting: Physician Assistant

## 2012-06-03 ENCOUNTER — Other Ambulatory Visit: Payer: Self-pay | Admitting: Physician Assistant

## 2012-07-13 ENCOUNTER — Ambulatory Visit (INDEPENDENT_AMBULATORY_CARE_PROVIDER_SITE_OTHER): Payer: 59 | Admitting: Family Medicine

## 2012-07-13 VITALS — BP 98/64 | HR 73 | Temp 98.3°F | Resp 16 | Ht 67.5 in | Wt 193.2 lb

## 2012-07-13 DIAGNOSIS — L821 Other seborrheic keratosis: Secondary | ICD-10-CM

## 2012-07-13 DIAGNOSIS — D239 Other benign neoplasm of skin, unspecified: Secondary | ICD-10-CM

## 2012-07-13 NOTE — Patient Instructions (Addendum)
Seborrheic Keratosis  Seborrheic keratosis is a common, noncancerous (benign) skin growth that can occur anywhere on the skin. It looks like "stuck-on," waxy, rough, tan, brown, or black spots on the skin. These skin growths can be flat or raised. They are often called "barnacles" because of their pasted-on appearance. Usually, these skin growths appear in adulthood, around age 63, and increase in number as you age. They may also develop during pregnancy or following estrogen therapy. Many people may only have one growth appear in their lifetime, while some people may develop many growths.  CAUSES  It is unknown what causes these skin growths, but they appear to run in families.  SYMPTOMS  Seborrheic keratosis is often located on the face, chest, shoulders, back, or other areas. These growths are:  · Usually painless, but may become irritated and itchy.  · Yellow, brown, black, or other colors.  · Slightly raised or have a flat surface.  · Sometimes rough or wart-like in texture.  · Often waxy on the surface.  · Round or oval-shaped.  · Sometimes "stuck-on" in appearance.  · Sometimes single, but there are usually many growths.  Any growth that bleeds, itches on a regular basis, becomes inflamed, or becomes irritated needs to be evaluated by a skin specialist (dermatologist).  DIAGNOSIS  Diagnosis is mainly based on the way the growths appear. In some cases, it can be difficult to tell this type of skin growth from skin cancer. A skin growth tissue sample (biopsy)  may be used to confirm the diagnosis.  TREATMENT  Most often, treatment is not needed because the skin growths are benign. If the skin growth is irritated easily by clothing or jewelry, causing it to scab or bleed, treatment may be recommended. Patients may also choose to have the growths removed because they do not like their appearance. Most commonly, these growths are treated with cryosurgery.  In cryosurgery, liquid nitrogen is applied to "freeze" the  growth. The growth usually falls off within a matter of days. A blister may form and dry into a scab that will also fall off. After the growth or scab falls off, it may leave a dark or light spot on the skin. This color may fade over time, or it may remain permanent on the skin.  HOME CARE INSTRUCTIONS  If the skin growths are treated with cryosurgery, the treated area needs to be kept clean with water and soap.  SEEK MEDICAL CARE IF:  · You have questions about these growths or other skin problems.  · You develop new symptoms, including:  · A change in the appearance of the skin growth.  · New growths.  · Any bleeding, itching, or pain in the growths.  · A skin growth that looks similar to seborrheic keratosis.  Document Released: 08/16/2010 Document Revised: 10/06/2011 Document Reviewed: 08/16/2010  [employer]® Patient Information ©2013 [employer].

## 2012-07-13 NOTE — Progress Notes (Signed)
61 year old woman recently noticed a skin lesion on her left flank and one make sure wasn't a cancer. She also has had an elevated lesion on her right dorsal thenar area for over a month that she wants checked.  Objective: Used  Dermlite: Elevated verrucous slightly pigmented area on right dorsal hand, verrucous 3 mm lesion left flank unpigmented. All these lesions were considered to be benign because of the waxy base consistent with seborrheic keratosis. They were cryotherapied with liquid nitrogen.  Assessment 2 seborrheic keratoses  Plan: Patient return in 2 weeks if lesions do not scale off and return skin 2 normal appearance

## 2012-08-09 ENCOUNTER — Ambulatory Visit (INDEPENDENT_AMBULATORY_CARE_PROVIDER_SITE_OTHER): Payer: 59 | Admitting: Emergency Medicine

## 2012-08-09 VITALS — BP 102/65 | HR 79 | Temp 98.1°F | Resp 16 | Ht 67.5 in | Wt 193.6 lb

## 2012-08-09 DIAGNOSIS — M5430 Sciatica, unspecified side: Secondary | ICD-10-CM

## 2012-08-09 MED ORDER — PREDNISONE 10 MG PO KIT: PACK | ORAL | Status: DC

## 2012-08-09 NOTE — Progress Notes (Signed)
[facility] [address] [place] [address] [phone]  Date:  08/09/2012   Name:  Kristianne Albin   DOB:  [birth year]   MRN:  [id number]  PCP:  Lucilla Edin, MD    Chief Complaint: Leg Pain   History of Present Illness:  Arnelle Nale is a 62 y.o. very pleasant female patient who presents with the following:  Walking Saturday and developed a pain in the left lateral dorsal foot.  Says that the pain is constant and tends to radiate up the lateral leg to above knee.  History of spondylolithesis.  Last MRI 10 years ago.  Has chronic back pain.  Denies numbness, tingling or weakness in leg.  Pain not affected by position or activity.  Diminished at night when she lays down.  No history of trauma or overuse.  Patient Active Problem List  Diagnosis  . Diabetes mellitus  . Arthritis  . Hypothyroid    Past Medical History  Diagnosis Date  . Rotator cuff disorder LEFT SHOULDER RTC IMPINGMENT  . Hyperlipidemia   . Normal nuclear stress test 01-25-2009  . Disorder of inner ear CAUSES VERTIGO OCCASIONALLY  . Chronic migraine BOTOX INJECTION EVERY 3 MONTHS  . Fibromyalgia   . Hemorrhoid   . Constipation   . Spondylolisthesis of lumbar region   . GERD (gastroesophageal reflux disease)   . Hypothyroidism   . History of thyroid cancer 2009  S/P TOTAL THYROIDECTOMY AND RADIATION  . Diabetes mellitus   . 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 Insomnia   . PONV (postoperative nausea and vomiting) SEVERE  . Chronic pain in right shoulder   . Left shoulder pain   . OA (osteoarthritis) JOINTS  . Hypertension CARDIOLOGIST- DR Donnie Aho- WILL REQUEST LATE NOTE    DENIES S & S    Past Surgical History  Procedure Date  . Right knee closed manipulation 09-11-2010  . Total knee arthroplasty 07-16-2010    RIGHT  . Right knee arthroscopy/ partial lateral menisectomy/ tricompartment chondroplasty/ decompression cyst 10-26-2009  . Right knee arthroscopy X2   BEFORE 2011  . Total thyroidectomy 2009    CANCER  (POST-OP BLEED)  AND RADIATION TX  . Left thumb joint replacement 2005    RIGHT DONE IN 2004  . Right shoulder arthroscopy 2010  &  2004  . Bilateral elbow surg. 1999  . Bilateral carpal tunnel release 1994  . Vaginal hysterectomy 1990  . Bilateral salpingoophectomy 1993    POST-OP URETER REPAIR 12 DAYS AFTER   . Tonsillectomy 1968  . Tibia cyst removed and orif leg fx 1958    History  Substance Use Topics  . Smoking status: Never Smoker   . Smokeless tobacco: Never Used  . Alcohol Use: Yes     Comment: RARE    No family history on file.  Allergies  Allergen Reactions  . Morphine And Related Nausea And Vomiting and Other (See Comments)    SEVERE N/V AND MIGRAINES  . Sulfa Antibiotics Hives    Medication list has been reviewed and updated.  Current Outpatient Prescriptions on File Prior to Visit  Medication Sig Dispense Refill  . acetaminophen (TYLENOL) 650 MG CR tablet Take 650 mg by mouth every 8 (eight) hours as needed.      Marland Kitchen aspirin 81 MG tablet Take 81 mg by mouth daily.      Marland Kitchen atovaquone-proguanil (MALARONE) 250-100 MG TABS Take 1 tablet by mouth daily.  30 tablet  0  . azelastine (ASTELIN) 137 MCG/SPRAY nasal spray Place 2 sprays into the nose 2 (two) times daily. Use in each nostril as directed  90 mL  3  . cetirizine (ZYRTEC) 10 MG tablet Take 10 mg by mouth daily.      Marland Kitchen conjugated estrogens (PREMARIN) vaginal cream Place 0.25 Applicatorfuls vaginally 3 (three) times a week.  42.5 g  3  . diclofenac (FLECTOR) 1.3 % PTCH Place 1 patch onto the skin 2 (two) times daily.  180 patch  3  . eletriptan (RELPAX) 40 MG tablet One tablet by mouth as needed for migraine headache.  If the headache improves and then returns, dose may be repeated after 2 hours have elapsed since first dose (do not exceed 80 mg per day). may repeat in 2 hours if necessary      . eletriptan (RELPAX) 40 MG tablet One tablet by mouth at onset of  headache. May repeat in 2 hours if headache persists or recurs. may repeat in 2 hours if necessary  24 tablet  7  . esomeprazole (NEXIUM) 40 MG capsule Take 40 mg by mouth as needed.      Marland Kitchen ibuprofen (ADVIL,MOTRIN) 200 MG tablet Take 200 mg by mouth every 6 (six) hours as needed.      Marland Kitchen levothyroxine (SYNTHROID, LEVOTHROID) 150 MCG tablet Take 1 tablet (150 mcg total) by mouth every evening.  90 tablet  3  . LIDODERM 5 % APPLY 1 PATCH FOR 12 OUT OF 24 HOURS A DAY  90 each  0  . losartan (COZAAR) 25 MG tablet Take 1 tablet (25 mg total) by mouth daily. t  90 tablet  1  . losartan (COZAAR) 50 MG tablet Take 1 tablet (50 mg total) by mouth daily.  90 tablet  0  . metaxalone (SKELAXIN) 800 MG tablet Take 1 tablet (800 mg total) by mouth as needed.  180 tablet  1  . metFORMIN (GLUCOPHAGE) 500 MG tablet Take 500 mg by mouth once.      . Methylcellulose, Laxative, (CITRUCEL PO) Take 600 mg by mouth 2 (two) times daily.      . metoprolol succinate (TOPROL-XL) 25 MG 24 hr tablet Take one half tablet daily  90 tablet  3  . metoprolol tartrate (LOPRESSOR) 12.5 mg TABS Take 12.5 mg by mouth every morning.      . OnabotulinumtoxinA (BOTOX IJ) Inject as directed. Every 3 months for migraines      . polyethylene glycol (MIRALAX / GLYCOLAX) packet Take 17 g by mouth as needed.      . sitaGLIPtin (JANUVIA) 100 MG tablet Take 1 tablet (100 mg total) by mouth every morning.  90 tablet  3  . traZODone (DESYREL) 100 MG tablet Patient takes 1 and 1/4 tablet q hs  90 tablet  1  . triamcinolone (NASACORT) 55 MCG/ACT nasal inhaler Place 2 sprays into the nose as needed.  3 Inhaler  3  . TRUETEST TEST test strip TEST BLOOD SUGAR TWICE A DAY  200 each  PRN  . VOLTAREN 1 % GEL APPLY 4 GRAMS 3-4 TIMES DAILY  1 Tube  5  . zolpidem (AMBIEN) 5 MG tablet TAKE 1 TABLET BY MOUTH AT BEDTIME  30 tablet  0    Review of Systems:  As per HPI, otherwise negative.    Physical Examination: Filed Vitals:   08/09/12 1400  BP:  102/65  Pulse: 79  Temp: 98.1 F (36.7 C)  Resp: 16   Filed  Vitals:   08/09/12 1400  Height: 5' 7.5" (1.715 m)  Weight: 193 lb 9.6 oz (87.816 kg)   Body mass index is 29.87 kg/(m^2). Ideal Body Weight: Weight in (lb) to have BMI = 25: 161.7    GEN: WDWN, NAD, Non-toxic, Alert & Oriented x 3 HEENT: Atraumatic, Normocephalic.  Ears and Nose: No external deformity. EXTR: No clubbing/cyanosis/edema NEURO: Normal gait.  PSYCH: Normally interactive. Conversant. Not depressed or anxious appearing.  Calm demeanor.  BACK:  No tenderness or spasm LEG:  No tenderness in thigh, calf or knee.  No calf tenderness.  No ecchymosis or swelling.  Assessment and Plan: Sciatic neuritis Follow up with ortho sterapred DS 12 day. Watch sugars  Carmelina Dane, MD

## 2012-08-16 ENCOUNTER — Ambulatory Visit (INDEPENDENT_AMBULATORY_CARE_PROVIDER_SITE_OTHER): Payer: 59 | Admitting: Emergency Medicine

## 2012-08-16 ENCOUNTER — Encounter: Payer: Self-pay | Admitting: Emergency Medicine

## 2012-08-16 VITALS — BP 111/71 | HR 74 | Temp 98.0°F | Resp 16 | Ht 68.0 in | Wt 194.0 lb

## 2012-08-16 DIAGNOSIS — M1990 Unspecified osteoarthritis, unspecified site: Secondary | ICD-10-CM

## 2012-08-16 DIAGNOSIS — M542 Cervicalgia: Secondary | ICD-10-CM

## 2012-08-16 DIAGNOSIS — E119 Type 2 diabetes mellitus without complications: Secondary | ICD-10-CM

## 2012-08-16 DIAGNOSIS — B351 Tinea unguium: Secondary | ICD-10-CM

## 2012-08-16 DIAGNOSIS — M129 Arthropathy, unspecified: Secondary | ICD-10-CM

## 2012-08-16 DIAGNOSIS — R29898 Other symptoms and signs involving the musculoskeletal system: Secondary | ICD-10-CM

## 2012-08-16 DIAGNOSIS — M810 Age-related osteoporosis without current pathological fracture: Secondary | ICD-10-CM

## 2012-08-16 LAB — COMPREHENSIVE METABOLIC PANEL
ALT: 19 U/L (ref 0–35)
AST: 17 U/L (ref 0–37)
CO2: 28 mEq/L (ref 19–32)
Calcium: 9.1 mg/dL (ref 8.4–10.5)
Chloride: 103 mEq/L (ref 96–112)
Creat: 0.56 mg/dL (ref 0.50–1.10)
Potassium: 3.9 mEq/L (ref 3.5–5.3)
Sodium: 140 mEq/L (ref 135–145)
Total Protein: 6.3 g/dL (ref 6.0–8.3)

## 2012-08-16 LAB — POCT GLYCOSYLATED HEMOGLOBIN (HGB A1C): Hemoglobin A1C: 6.6

## 2012-08-16 LAB — LIPID PANEL
LDL Cholesterol: 125 mg/dL — ABNORMAL HIGH (ref 0–99)
Triglycerides: 271 mg/dL — ABNORMAL HIGH (ref <150)
VLDL: 54 mg/dL — ABNORMAL HIGH (ref 0–40)

## 2012-08-16 MED ORDER — METFORMIN HCL 500 MG PO TABS: ORAL_TABLET | ORAL | Status: DC

## 2012-08-16 MED ORDER — CICLOPIROX 8 % EX SOLN: Freq: Every day | CUTANEOUS | Status: DC

## 2012-08-16 NOTE — Progress Notes (Signed)
  Subjective:    Patient ID: Debbie Hayes, female    DOB: 12-30-1950, 62 y.o.   MRN: [id number]  HPI patient enters for followup of her diabetes. She's been working hard on diet and taking small doses of Glucophage . She is having a burning sensation in her left periscapular area and also has noted some difficulty with possibly a foot drop on the right leg.. She's had no chest pain or shortness of breath. Last year she had neurological symptoms and had an extensive workup including MRA of the brain and carotid Dopplers. She does complain of neck discomfort and has been told of significant neck arthritis. She complaining of a toenail fungus she has had difficulty getting to resolve. He is interested in trying increasing doses of Glucophage and getting off the Januvia to    Review of Systems     Objective:   Physical Exam HEENT exam is unremarkable. There is tenderness along the left periscapular area. Chest is clear heart regular rate no murmurs abdomen soft nontender extremity exam reveals normal sensation. Neurological exam reveals no focal abnormalities        Results for orders placed in visit on 08/16/12  POCT GLYCOSYLATED HEMOGLOBIN (HGB A1C)      Component Value Range   Hemoglobin A1C 6.6     Assessment & Plan:  We'll try and increase her dosage of Glucophage and will back on her dosage of Januvia PRI the C-spine to make sure there no signs of cord compression which could give her symptoms in her left periscapular area and weakness in her right foot. Her hemoglobin A1c is improved at 6.6. I also ordered a bone density study in that this was ordered last time but not done

## 2012-08-23 ENCOUNTER — Other Ambulatory Visit: Payer: Self-pay

## 2012-09-03 ENCOUNTER — Other Ambulatory Visit: Payer: Self-pay | Admitting: *Deleted

## 2012-09-03 MED ORDER — NITROFURANTOIN MONOHYD MACRO 100 MG PO CAPS: 100.0000 mg | ORAL_CAPSULE | Freq: Two times a day (BID) | ORAL | Status: DC

## 2012-09-18 ENCOUNTER — Other Ambulatory Visit: Payer: Self-pay | Admitting: *Deleted

## 2012-09-18 MED ORDER — ROSUVASTATIN CALCIUM 5 MG PO TABS: 5.0000 mg | ORAL_TABLET | Freq: Every day | ORAL | Status: DC

## 2012-09-22 ENCOUNTER — Other Ambulatory Visit: Payer: Self-pay | Admitting: Radiology

## 2012-09-22 NOTE — Telephone Encounter (Signed)
Please advise on Ambien refill received FAX from [facility] pended

## 2012-09-23 MED ORDER — ZOLPIDEM TARTRATE 5 MG PO TABS: 5.0000 mg | ORAL_TABLET | Freq: Every evening | ORAL | Status: DC | PRN

## 2012-09-24 ENCOUNTER — Other Ambulatory Visit: Payer: Self-pay | Admitting: Physician Assistant

## 2012-10-07 ENCOUNTER — Other Ambulatory Visit: Payer: Self-pay | Admitting: Physician Assistant

## 2012-10-08 ENCOUNTER — Telehealth: Payer: Self-pay

## 2012-10-08 NOTE — Telephone Encounter (Signed)
Received a request for RFs of pt's Zolpidem 5 mg w/note that the Rx sent on 09/22/12 was not received. I called in Rx as written by Dr Cleta Alberts.

## 2012-10-11 ENCOUNTER — Encounter: Payer: Self-pay | Admitting: Emergency Medicine

## 2012-10-16 ENCOUNTER — Telehealth: Payer: Self-pay | Admitting: Emergency Medicine

## 2012-10-16 ENCOUNTER — Other Ambulatory Visit: Payer: Self-pay | Admitting: Emergency Medicine

## 2012-10-16 DIAGNOSIS — J019 Acute sinusitis, unspecified: Secondary | ICD-10-CM

## 2012-10-16 MED ORDER — AZITHROMYCIN 250 MG PO TABS: ORAL_TABLET | ORAL | Status: DC

## 2012-10-16 NOTE — Telephone Encounter (Signed)
She had purulent discharge from her left nares. Patient has had symptoms for 2 weeks. We'll treat with a Z-Pak this was called in.

## 2012-11-04 ENCOUNTER — Other Ambulatory Visit: Payer: Self-pay

## 2012-11-04 NOTE — Telephone Encounter (Signed)
The following email was submitted to your website from [facility] Please give this message to Daub.  You can use email for reply.  I have successfully used the topical medication for toenail fungus and would like to try a round of Lamisil (generic), to be called into [facility] outpatient, [address]  Thank you, Debbie Hayes Date of birth: 1950/08/18

## 2012-11-05 MED ORDER — TERBINAFINE HCL 250 MG PO TABS: 250.0000 mg | ORAL_TABLET | Freq: Every day | ORAL | Status: DC

## 2012-11-05 NOTE — Telephone Encounter (Signed)
Spoke w/pt and warned her of possibility of hypotension or bradycardia w/Lamisil use. Pt stated that she has used it twice before for 6 mos intervals w/no SEs, but thanked [place] for letting her know and she will monitor for these SEs. Sending in Rx.

## 2012-11-05 NOTE — Telephone Encounter (Signed)
Lamisil may increase betablocker activity, so there is a possible increased risk of hypotension, bradycardia.  As long as pt is aware and will monitor this, ok to send

## 2012-11-05 NOTE — Telephone Encounter (Signed)
Please advise, when I tried to put this in I got a warning could interact with her metoprolol, is this okay to over ride the warning?

## 2012-11-05 NOTE — Telephone Encounter (Signed)
Okay, and Lamisil 250 one a day #90 tablets. . She needs to return to clinic in one month for liver tests.

## 2012-11-10 ENCOUNTER — Other Ambulatory Visit: Payer: Self-pay | Admitting: Endocrinology

## 2012-11-10 DIAGNOSIS — C73 Malignant neoplasm of thyroid gland: Secondary | ICD-10-CM

## 2012-11-12 ENCOUNTER — Other Ambulatory Visit: Payer: Self-pay

## 2012-11-12 ENCOUNTER — Telehealth: Payer: Self-pay

## 2012-11-12 MED ORDER — METFORMIN HCL ER 500 MG PO TB24: 500.0000 mg | ORAL_TABLET | Freq: Two times a day (BID) | ORAL | Status: DC

## 2012-11-12 MED ORDER — TRAZODONE HCL 100 MG PO TABS: ORAL_TABLET | ORAL | Status: DC

## 2012-11-12 NOTE — Telephone Encounter (Signed)
Pharmacy faxed for clarification as to whether Dr Cleta Alberts wants pt on immed release Metformin 500 BID or on Metformin ER 500 BID since she was previously on ER version. Checked w/Dr Cleta Alberts who definitely wants her on the Metformin ER 500 BID since pt tolerates the ER better. Sending in new Rx.

## 2012-11-27 ENCOUNTER — Ambulatory Visit: Payer: 59

## 2012-11-30 ENCOUNTER — Encounter: Payer: Self-pay | Admitting: Emergency Medicine

## 2012-12-01 ENCOUNTER — Ambulatory Visit (INDEPENDENT_AMBULATORY_CARE_PROVIDER_SITE_OTHER): Payer: 59 | Admitting: Emergency Medicine

## 2012-12-01 VITALS — BP 103/69 | HR 84 | Temp 98.7°F | Resp 18 | Ht 68.0 in | Wt 192.0 lb

## 2012-12-01 DIAGNOSIS — D229 Melanocytic nevi, unspecified: Secondary | ICD-10-CM

## 2012-12-01 DIAGNOSIS — M545 Low back pain: Secondary | ICD-10-CM

## 2012-12-01 DIAGNOSIS — D239 Other benign neoplasm of skin, unspecified: Secondary | ICD-10-CM

## 2012-12-01 DIAGNOSIS — B54 Unspecified malaria: Secondary | ICD-10-CM

## 2012-12-01 DIAGNOSIS — K219 Gastro-esophageal reflux disease without esophagitis: Secondary | ICD-10-CM

## 2012-12-01 DIAGNOSIS — B351 Tinea unguium: Secondary | ICD-10-CM | POA: Insufficient documentation

## 2012-12-01 DIAGNOSIS — G4700 Insomnia, unspecified: Secondary | ICD-10-CM

## 2012-12-01 LAB — COMPREHENSIVE METABOLIC PANEL
AST: 20 U/L (ref 0–37)
Alkaline Phosphatase: 77 U/L (ref 39–117)
BUN: 14 mg/dL (ref 6–23)
Glucose, Bld: 140 mg/dL — ABNORMAL HIGH (ref 70–99)
Total Bilirubin: 0.2 mg/dL — ABNORMAL LOW (ref 0.3–1.2)

## 2012-12-01 LAB — POCT GLYCOSYLATED HEMOGLOBIN (HGB A1C): Hemoglobin A1C: 6.6

## 2012-12-01 MED ORDER — TRAZODONE HCL 100 MG PO TABS: ORAL_TABLET | ORAL | Status: DC

## 2012-12-01 MED ORDER — ZOLPIDEM TARTRATE 5 MG PO TABS: ORAL_TABLET | ORAL | Status: DC

## 2012-12-01 MED ORDER — OMEPRAZOLE 20 MG PO CPDR: 20.0000 mg | DELAYED_RELEASE_CAPSULE | Freq: Every day | ORAL | Status: DC

## 2012-12-01 MED ORDER — ATOVAQUONE-PROGUANIL HCL 250-100 MG PO TABS: 1.0000 | ORAL_TABLET | Freq: Every day | ORAL | Status: DC

## 2012-12-01 MED ORDER — TERBINAFINE HCL 250 MG PO TABS: 250.0000 mg | ORAL_TABLET | Freq: Every day | ORAL | Status: DC

## 2012-12-01 MED ORDER — METAXALONE 800 MG PO TABS: 800.0000 mg | ORAL_TABLET | ORAL | Status: DC | PRN

## 2012-12-01 NOTE — Progress Notes (Signed)
  Subjective:    Patient ID: Debbie Hayes, female    DOB: [birth year], 62 y.o.   MRN: [id number]  HPI presents today for recheck. She has switched medication to metformin.  Blood sugars have improved. Needs liver function for Lamisil refill. Voltaren patch hasn't been working as well for back pain. Wanted to know is something she could change to that is not narcotic.  Has seen back specialist at [facility] orthopedic. Thinks maybe neurosurgeon would be better for her back. Taking crestor 2.5 three times a week. Does have hx of vertigo. Thinks that she is starting to have spells of that again. When she walks and stumbles feels a little dizzy and off. Would like a couple places one on neck and R shoulder, and R arm, and L leg that she would like frozen with liquid nitrogen.     Review of Systems     Objective:   Physical Exam there's some scaly skin over the forehead. There is a 2-3 mm reddened area in the left supraclavicular area. There are also intermittent rough areas of skin on her left knee and arm. Chest is clear to auscultation percussion. Heart regular rate no murmurs. Results for orders placed in visit on 12/01/12  POCT GLYCOSYLATED HEMOGLOBIN (HGB A1C)      Result Value Range   Hemoglobin A1C 6.6          Assessment & Plan:  Areas of thick scaly skin were treated with liquid nitrogen 6-8 seconds x2. She was given Malarone for upcoming trip to [place]. Lamisil was refilled and LFTs were done. Her hemoglobin A1c is improved at 6.6. She is tolerating the higher dose of metformin . Referral made to dermatology regarding a lesion in her left supraclavicular area

## 2012-12-01 NOTE — Progress Notes (Deleted)
  Subjective:    Patient ID: Debbie Hayes, female    DOB: 1950-10-26, 62 y.o.   MRN: [id number]  HPI    Review of Systems     Objective:   Physical Exam        Assessment & Plan:   Results for orders placed in visit on 12/01/12  POCT GLYCOSYLATED HEMOGLOBIN (HGB A1C)      Result Value Range   Hemoglobin A1C 6.6

## 2012-12-06 ENCOUNTER — Ambulatory Visit (HOSPITAL_COMMUNITY)
Admission: RE | Admit: 2012-12-06 | Discharge: 2012-12-06 | Disposition: A | Discharge status: Home / Self Care | Payer: 59 | Source: Ambulatory Visit | Attending: Emergency Medicine | Admitting: Emergency Medicine

## 2012-12-06 DIAGNOSIS — M51379 Other intervertebral disc degeneration, lumbosacral region without mention of lumbar back pain or lower extremity pain: Secondary | ICD-10-CM | POA: Insufficient documentation

## 2012-12-06 DIAGNOSIS — M545 Low back pain, unspecified: Secondary | ICD-10-CM | POA: Insufficient documentation

## 2012-12-06 DIAGNOSIS — M5137 Other intervertebral disc degeneration, lumbosacral region: Secondary | ICD-10-CM | POA: Insufficient documentation

## 2012-12-06 DIAGNOSIS — M47817 Spondylosis without myelopathy or radiculopathy, lumbosacral region: Secondary | ICD-10-CM | POA: Insufficient documentation

## 2012-12-22 ENCOUNTER — Encounter: Payer: Self-pay | Admitting: Emergency Medicine

## 2013-01-10 ENCOUNTER — Other Ambulatory Visit: Payer: Self-pay | Admitting: Emergency Medicine

## 2013-01-10 ENCOUNTER — Encounter (HOSPITAL_COMMUNITY)
Admission: RE | Admit: 2013-01-10 | Discharge: 2013-01-10 | Disposition: A | Discharge status: Home / Self Care | Payer: 59 | Source: Ambulatory Visit | Attending: Endocrinology | Admitting: Endocrinology

## 2013-01-10 DIAGNOSIS — C73 Malignant neoplasm of thyroid gland: Secondary | ICD-10-CM | POA: Insufficient documentation

## 2013-01-10 MED ORDER — THYROTROPIN ALFA 1.1 MG IM SOLR
0.9000 mg | INTRAMUSCULAR | Status: DC
  Filled 2013-01-10: qty 0.9

## 2013-01-11 ENCOUNTER — Encounter (HOSPITAL_COMMUNITY)
Admission: RE | Admit: 2013-01-11 | Discharge: 2013-01-11 | Disposition: A | Discharge status: Home / Self Care | Payer: 59 | Source: Ambulatory Visit | Attending: Endocrinology | Admitting: Endocrinology

## 2013-01-11 ENCOUNTER — Other Ambulatory Visit: Payer: Self-pay | Admitting: Physician Assistant

## 2013-01-11 MED ORDER — THYROTROPIN ALFA 1.1 MG IM SOLR
0.9000 mg | INTRAMUSCULAR | Status: AC
  Administered 2013-01-11: 0.9 mg via INTRAMUSCULAR
  Filled 2013-01-11: qty 0.9

## 2013-01-11 MED ORDER — METFORMIN HCL ER 500 MG PO TB24: 500.0000 mg | ORAL_TABLET | Freq: Two times a day (BID) | ORAL | Status: DC

## 2013-01-12 ENCOUNTER — Encounter (HOSPITAL_COMMUNITY)
Admission: RE | Admit: 2013-01-12 | Discharge: 2013-01-12 | Disposition: A | Discharge status: Home / Self Care | Payer: 59 | Source: Ambulatory Visit | Attending: Endocrinology | Admitting: Endocrinology

## 2013-01-14 ENCOUNTER — Encounter (HOSPITAL_COMMUNITY)
Admission: RE | Admit: 2013-01-14 | Discharge: 2013-01-14 | Disposition: A | Discharge status: Home / Self Care | Payer: 59 | Source: Ambulatory Visit | Attending: Endocrinology | Admitting: Endocrinology

## 2013-01-14 MED ORDER — SODIUM IODIDE I 131 CAPSULE
4.0000 | Freq: Once | INTRAVENOUS | Status: AC | PRN
  Administered 2013-01-14: 4 via ORAL

## 2013-01-19 ENCOUNTER — Telehealth: Payer: Self-pay | Admitting: Emergency Medicine

## 2013-01-19 ENCOUNTER — Other Ambulatory Visit: Payer: Self-pay | Admitting: Emergency Medicine

## 2013-01-19 DIAGNOSIS — E1110 Type 2 diabetes mellitus with ketoacidosis without coma: Secondary | ICD-10-CM

## 2013-01-19 MED ORDER — METFORMIN HCL 1000 MG PO TABS: 1000.0000 mg | ORAL_TABLET | Freq: Two times a day (BID) | ORAL | Status: DC

## 2013-01-19 NOTE — Telephone Encounter (Signed)
Renae Fickle lives and let her know I called in and 1 g tablets to take twice a day. The metformin does not come in an extended release tablet at 1 g. So these are the regular tablets

## 2013-01-20 NOTE — Telephone Encounter (Signed)
Called Liza, advised.

## 2013-01-21 ENCOUNTER — Encounter: Payer: Self-pay | Admitting: Emergency Medicine

## 2013-02-21 ENCOUNTER — Telehealth: Payer: Self-pay | Admitting: Radiology

## 2013-02-21 NOTE — Telephone Encounter (Signed)
[id number] The following email was submitted to your website from [facility]  "[employer]" Please ask Dr. Cleta Alberts to refill my turbinifine at [facility] outpatient on [address] [facility]  It usually takes 6 months to clear up my toenails.  Also please ask him to change my triptan to [facility], also on [address].

## 2013-02-22 ENCOUNTER — Other Ambulatory Visit: Payer: Self-pay | Admitting: Radiology

## 2013-02-22 MED ORDER — TERBINAFINE HCL 250 MG PO TABS: 250.0000 mg | ORAL_TABLET | Freq: Every day | ORAL | Status: DC

## 2013-02-22 MED ORDER — FROVATRIPTAN SUCCINATE 2.5 MG PO TABS: 2.5000 mg | ORAL_TABLET | ORAL | Status: DC | PRN

## 2013-02-22 NOTE — Telephone Encounter (Signed)
Okay to refill her Lamisil for 3 months. Also okay to change her tryptophan to Frova 2.5 mg with administration instructions the same as her previous trypan. She can have a three-month supply with 3 refills

## 2013-02-22 NOTE — Telephone Encounter (Signed)
meds sent/ patient requested via email

## 2013-02-22 NOTE — Telephone Encounter (Signed)
Done

## 2013-03-04 ENCOUNTER — Other Ambulatory Visit: Payer: Self-pay | Admitting: Radiology

## 2013-03-04 DIAGNOSIS — G4700 Insomnia, unspecified: Secondary | ICD-10-CM

## 2013-03-04 MED ORDER — FROVATRIPTAN SUCCINATE 2.5 MG PO TABS: 2.5000 mg | ORAL_TABLET | ORAL | Status: DC | PRN

## 2013-03-04 MED ORDER — ZOLPIDEM TARTRATE 5 MG PO TABS: ORAL_TABLET | ORAL | Status: DC

## 2013-03-10 ENCOUNTER — Ambulatory Visit (INDEPENDENT_AMBULATORY_CARE_PROVIDER_SITE_OTHER): Payer: 59 | Admitting: Physician Assistant

## 2013-03-10 VITALS — BP 122/74 | HR 73 | Temp 98.5°F | Resp 18 | Ht 68.0 in | Wt 183.0 lb

## 2013-03-10 DIAGNOSIS — Z23 Encounter for immunization: Secondary | ICD-10-CM

## 2013-03-10 NOTE — Progress Notes (Signed)
S:  This 62 y.o. female presents for evaluation of need for tetanus vaccination.  Small wound on the LEFT heel today moving the trash can.  Washed. No problems/concerns regarding the wound, but believes it's been >10 years since her last Td.  O: BP 122/74  Pulse 73  Temp(Src) 98.5 F (36.9 C) (Oral)  Resp 18  Ht 5\' 8"  (1.727 m)  Wt 183 lb (83.008 kg)  BMI 27.83 kg/m2  SpO2 97% Tiny wound on the LEFT heel.  No evidence of cellulitis at this time.  A/P: Need for Tdap. Vaccine given.

## 2013-03-21 ENCOUNTER — Telehealth: Payer: Self-pay

## 2013-03-21 MED ORDER — LIDOCAINE 5 % EX PTCH: MEDICATED_PATCH | CUTANEOUS | Status: DC

## 2013-03-21 NOTE — Telephone Encounter (Signed)
Pharm reqs RF of lidoderm 5% patches.

## 2013-03-27 ENCOUNTER — Ambulatory Visit (INDEPENDENT_AMBULATORY_CARE_PROVIDER_SITE_OTHER): Payer: 59 | Admitting: Emergency Medicine

## 2013-03-27 VITALS — BP 118/72 | HR 69 | Temp 98.0°F | Resp 18 | Wt 183.0 lb

## 2013-03-27 DIAGNOSIS — R309 Painful micturition, unspecified: Secondary | ICD-10-CM

## 2013-03-27 DIAGNOSIS — N390 Urinary tract infection, site not specified: Secondary | ICD-10-CM

## 2013-03-27 LAB — POCT URINALYSIS DIPSTICK
Bilirubin, UA: NEGATIVE
Glucose, UA: NEGATIVE
Ketones, UA: NEGATIVE
Leukocytes, UA: NEGATIVE
Nitrite, UA: NEGATIVE
Protein, UA: NEGATIVE
Spec Grav, UA: 1.02
Urobilinogen, UA: 0.2
pH, UA: 5.5

## 2013-03-27 LAB — POCT UA - MICROSCOPIC ONLY
Casts, Ur, LPF, POC: NEGATIVE
Crystals, Ur, HPF, POC: NEGATIVE
Epithelial cells, urine per micros: NEGATIVE
Mucus, UA: NEGATIVE
WBC, Ur, HPF, POC: NEGATIVE
Yeast, UA: NEGATIVE

## 2013-03-27 NOTE — Progress Notes (Signed)
  Subjective:    Patient ID: Silvana Newness, female    DOB: 1951-05-20, 63 y.o.   MRN: [id number]  HPI presents with burning on urination frequent urination. She has a history of recurrent urinary tract infections. She took a course of Macrobid but did not improve and switched to Keflex. She continues to have symptoms of burning on urination.    Review of Systems     Objective:   Physical Exam Examination there is no CVA tenderness the abdomen is soft nontender.  Results for orders placed in visit on 03/27/13  POCT URINALYSIS DIPSTICK      Result Value Range   Color, UA yellow     Clarity, UA clear     Glucose, UA neg     Bilirubin, UA neg     Ketones, UA neg     Spec Grav, UA 1.020     Blood, UA trace-intact     pH, UA 5.5     Protein, UA neg     Urobilinogen, UA 0.2     Nitrite, UA neg     Leukocytes, UA Negative    POCT UA - MICROSCOPIC ONLY      Result Value Range   WBC, Ur, HPF, POC neg     RBC, urine, microscopic 0-1     Bacteria, U Microscopic trace     Mucus, UA neg     Epithelial cells, urine per micros neg     Crystals, Ur, HPF, POC neg     Casts, Ur, LPF, POC neg     Yeast, UA neg         Assessment & Plan:  Will check a urine CX. The urinalysis here today is normal.

## 2013-03-29 LAB — URINE CULTURE
Colony Count: NO GROWTH
Organism ID, Bacteria: NO GROWTH

## 2013-04-21 ENCOUNTER — Other Ambulatory Visit: Payer: Self-pay | Admitting: Emergency Medicine

## 2013-04-21 MED ORDER — LANCETS MISC: Status: DC

## 2013-04-21 NOTE — Telephone Encounter (Signed)
Sent in test strips and lancets 

## 2013-05-05 ENCOUNTER — Other Ambulatory Visit: Payer: Self-pay | Admitting: Emergency Medicine

## 2013-05-25 ENCOUNTER — Ambulatory Visit: Payer: 59

## 2013-05-25 ENCOUNTER — Ambulatory Visit (INDEPENDENT_AMBULATORY_CARE_PROVIDER_SITE_OTHER): Payer: 59 | Admitting: Emergency Medicine

## 2013-05-25 VITALS — BP 110/72 | HR 66 | Temp 98.3°F | Resp 18 | Ht 68.0 in | Wt 181.0 lb

## 2013-05-25 DIAGNOSIS — I10 Essential (primary) hypertension: Secondary | ICD-10-CM

## 2013-05-25 DIAGNOSIS — M25552 Pain in left hip: Secondary | ICD-10-CM

## 2013-05-25 DIAGNOSIS — Z23 Encounter for immunization: Secondary | ICD-10-CM

## 2013-05-25 DIAGNOSIS — E039 Hypothyroidism, unspecified: Secondary | ICD-10-CM

## 2013-05-25 DIAGNOSIS — M25559 Pain in unspecified hip: Secondary | ICD-10-CM

## 2013-05-25 DIAGNOSIS — E119 Type 2 diabetes mellitus without complications: Secondary | ICD-10-CM

## 2013-05-25 LAB — LIPID PANEL: HDL: 54 mg/dL (ref >39)

## 2013-05-25 LAB — POCT CBC
Granulocyte percent: 57.5 %G (ref 37–80)
HCT, POC: 44.3 % (ref 37.7–47.9)
Hemoglobin: 13.7 g/dL (ref 12.2–16.2)
MCV: 92.1 fL (ref 80–97)
POC LYMPH PERCENT: 35.6 %L (ref 10–50)
RBC: 4.81 M/uL (ref 4.04–5.48)

## 2013-05-25 LAB — POCT URINALYSIS DIPSTICK
Bilirubin, UA: NEGATIVE
Blood, UA: NEGATIVE
Glucose, UA: NEGATIVE
Spec Grav, UA: <=1.005

## 2013-05-25 LAB — POCT GLYCOSYLATED HEMOGLOBIN (HGB A1C): Hemoglobin A1C: 6.3

## 2013-05-25 LAB — COMPREHENSIVE METABOLIC PANEL
ALT: 18 U/L (ref 0–35)
AST: 17 U/L (ref 0–37)
Albumin: 4.7 g/dL (ref 3.5–5.2)
BUN: 16 mg/dL (ref 6–23)
Calcium: 9.5 mg/dL (ref 8.4–10.5)
Chloride: 101 mEq/L (ref 96–112)
Potassium: 4.3 mEq/L (ref 3.5–5.3)
Total Protein: 7.1 g/dL (ref 6.0–8.3)

## 2013-05-25 LAB — TSH: TSH: 1.938 u[IU]/mL (ref 0.350–4.500)

## 2013-05-25 LAB — POCT UA - MICROSCOPIC ONLY
Bacteria, U Microscopic: NEGATIVE
Mucus, UA: NEGATIVE

## 2013-05-25 MED ORDER — ZOLPIDEM TARTRATE 5 MG PO TABS: 2.5000 mg | ORAL_TABLET | Freq: Every evening | ORAL | Status: DC | PRN

## 2013-05-25 NOTE — Patient Instructions (Signed)
Influenza Vaccine (Flu Vaccine, Inactivated) 2013 2014 What You Need to Know WHY GET VACCINATED?  Influenza ("flu") is a contagious disease that spreads around the [place] every winter, usually between October and May.  Flu is caused by the influenza virus, and can be spread by coughing, sneezing, and close contact.  Anyone can get flu, but the risk of getting flu is highest among children. Symptoms come on suddenly and may last several days. They can include:  Fever or chills.  Sore throat.  Muscle aches.  Fatigue.  Cough.  Headache.  Runny or stuffy nose. Flu can make some people much sicker than others. These people include young children, people 65 and older, pregnant women, and people with certain health conditions such as heart, lung or kidney disease, or a weakened immune system. Flu vaccine is especially important for these people, and anyone in close contact with them. Flu can also lead to pneumonia, and make existing medical conditions worse. It can cause diarrhea and seizures in children. Each year thousands of people in the [place] die from flu, and many more are hospitalized. Flu vaccine is the best protection we have from flu and its complications. Flu vaccine also helps prevent spreading flu from person to person. INACTIVATED FLU VACCINE There are 2 types of influenza vaccine:  You are getting an inactivated flu vaccine, which does not contain any live influenza virus. It is given by injection with a needle, and often called the "flu shot."  A different live, attenuated (weakened) influenza vaccine is sprayed into the nostrils. This vaccine is described in a separate Vaccine Information Statement. Flu vaccine is recommended every year. Children 6 months through 8 years of age should get 2 doses the first year they get vaccinated. Flu viruses are always changing. Each year's flu vaccine is made to protect from viruses that are most likely to cause disease  that year. While flu vaccine cannot prevent all cases of flu, it is our best defense against the disease. Inactivated flu vaccine protects against 3 or 4 different influenza viruses. It takes about 2 weeks for protection to develop after the vaccination, and protection lasts several months to a year. Some illnesses that are not caused by influenza virus are often mistaken for flu. Flu vaccine will not prevent these illnesses. It can only prevent influenza. A "high-dose" flu vaccine is available for people 65 years of age and older. The person giving you the vaccine can tell you more about it. Some inactivated flu vaccine contains a very small amount of a mercury-based preservative called thimerosal. Studies have shown that thimerosal in vaccines is not harmful, but flu vaccines that do not contain a preservative are available. SOME PEOPLE SHOULD NOT GET THIS VACCINE Tell the person who gives you the vaccine:  If you have any severe (life-threatening) allergies. If you ever had a life-threatening allergic reaction after a dose of flu vaccine, or have a severe allergy to any part of this vaccine, you may be advised not to get a dose. Most, but not all, types of flu vaccine contain a small amount of egg.  If you ever had Guillain Barr Syndrome (a severe paralyzing illness, also called GBS). Some people with a history of GBS should not get this vaccine. This should be discussed with your doctor.  If you are not feeling well. They might suggest waiting until you feel better. But you should come back. RISKS OF A VACCINE REACTION With a vaccine, like any medicine, there   is a chance of side effects. These are usually mild and go away on their own. Serious side effects are also possible, but are very rare. Inactivated flu vaccine does not contain live flu virus, sogetting flu from this vaccine is not possible. Brief fainting spells and related symptoms (such as jerking movements) can happen after any medical  procedure, including vaccination. Sitting or lying down for about 15 minutes after a vaccination can help prevent fainting and injuries caused by falls. Tell your doctor if you feel dizzy or lightheaded, or have vision changes or ringing in the ears. Mild problems following inactivated flu vaccine:  Soreness, redness, or swelling where the shot was given.  Hoarseness; sore, red or itchy eyes; or cough.  Fever.  Aches.  Headache.  Itching.  Fatigue. If these problems occur, they usually begin soon after the shot and last 1 or 2 days. Moderate problems following inactivated flu vaccine:  Young children who get inactivated flu vaccine and pneumococcal vaccine (PCV13) at the same time may be at increased risk for seizures caused by fever. Ask your doctor for more information. Tell your doctor if a child who is getting flu vaccine has ever had a seizure. Severe problems following inactivated flu vaccine:  A severe allergic reaction could occur after any vaccine (estimated less than 1 in a million doses).  There is a small possibility that inactivated flu vaccine could be associated with Guillan Barr Syndrome (GBS), no more than 1 or 2 cases per million people vaccinated. This is much lower than the risk of severe complications from flu, which can be prevented by flu vaccine. The safety of vaccines is always being monitored. For more information, visit: www.cdc.gov/vaccinesafety/ WHAT IF THERE IS A SERIOUS REACTION? What should I look for?  Look for anything that concerns you, such as signs of a severe allergic reaction, very high fever, or behavior changes. Signs of a severe allergic reaction can include hives, swelling of the face and throat, difficulty breathing, a fast heartbeat, dizziness, and weakness. These would start a few minutes to a few hours after the vaccination. What should I do?  If you think it is a severe allergic reaction or other emergency that cannot wait, call 9 1 1  or get the person to the nearest hospital. Otherwise, call your doctor.  Afterward, the reaction should be reported to the Vaccine Adverse Event Reporting System (VAERS). Your doctor might file this report, or you can do it yourself through the VAERS website at www.vaers.hhs.gov, or by calling 1-800-822-7967. VAERS is only for reporting reactions. They do not give medical advice. THE NATIONAL VACCINE INJURY COMPENSATION PROGRAM The National Vaccine Injury Compensation Program (VICP) is a federal program that was created to compensate people who may have been injured by certain vaccines. Persons who believe they may have been injured by a vaccine can learn about the program and about filing a claim by calling 1-800-338-2382 or visiting the VICP website at www.hrsa.gov/vaccinecompensation HOW CAN I LEARN MORE?  Ask your doctor.  Call your local or state health department.  Contact the Centers for Disease Control and Prevention (CDC):  Call 1-800-232-4636 (1-800-CDC-INFO) or  Visit CDC's website at www.cdc.gov/flu CDC Inactivated Influenza Vaccine Interim VIS (02/20/12) Document Released: 05/08/2006 Document Revised: 04/07/2012 Document Reviewed: 02/20/2012 ExitCare Patient Information 2014 [employer].  

## 2013-05-25 NOTE — Progress Notes (Addendum)
Subjective:    Patient ID: Debbie Hayes, female    DOB: [birth year], 62 y.o.   MRN: [id number]  HPI This chart was scribed for Debbie Hayes-MD, by Ladona Ridgel Day, Scribe. This patient was seen in room 10 and the patient's care was started at 12:35 PM.  HPI Comments: [name] is a 62 y.o. female who presents to the [facility] for a regular diabetes checkup as well requests a flu shot and blood work. She also c/o chronic right hip pain which has recently worsened. She reports 20 lbs of weight loss over the past year and would like to reduce her DM medicines. She also states concerned about her blood pressure during the evening time and states that her BP is 130/80 in the evening but 110/70 in the morning. She usually takes 25 of metoprolol in the morning and 25 of cozaar in the morning. She also states would like to have her lipid panel checked since she has not had in check in a while.  She also reports having symptoms to when she previously had a UTI.   Past Medical History  Diagnosis Date  . Rotator cuff disorder LEFT SHOULDER RTC IMPINGMENT  . Hyperlipidemia   . Normal nuclear stress test 01-25-2009  . Disorder of inner ear CAUSES VERTIGO OCCASIONALLY  . Chronic migraine BOTOX INJECTION EVERY 3 MONTHS  . Fibromyalgia   . Hemorrhoid   . Constipation   . Spondylolisthesis of lumbar region   . GERD (gastroesophageal reflux disease)   . Hypothyroidism   . History of thyroid cancer 2009  S/P TOTAL THYROIDECTOMY AND RADIATION  . Diabetes mellitus   . 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 Insomnia   . PONV (postoperative nausea and vomiting) SEVERE  . Chronic pain in right shoulder   . Left shoulder pain   . OA (osteoarthritis) JOINTS  . Hypertension CARDIOLOGIST- DR Donnie Aho- WILL REQUEST LATE NOTE    DENIES S & S    Past Surgical History  Procedure Laterality Date  . Right knee closed manipulation  09-11-2010  . Total knee arthroplasty  07-16-2010     RIGHT  . Right knee arthroscopy/ partial lateral menisectomy/ tricompartment chondroplasty/ decompression cyst  10-26-2009  . Right knee arthroscopy  X2  BEFORE 2011  . Total thyroidectomy  2009    CANCER  (POST-OP BLEED)  AND RADIATION TX  . Left thumb joint replacement  2005    RIGHT DONE IN 2004  . Right shoulder arthroscopy  2010  &  2004  . Bilateral elbow surg.  1999  . Bilateral carpal tunnel release  1994  . Vaginal hysterectomy  1990  . Bilateral salpingoophectomy  1993    POST-OP URETER REPAIR 12 DAYS AFTER   . Tonsillectomy  1968  . Tibia cyst removed and orif leg fx  1958    History reviewed. No pertinent family history.  History   Social History  . Marital Status: Married    Spouse Name: N/A    Number of Children: N/A  . Years of Education: N/A   Occupational History  . Not on file.   Social History Main Topics  . Smoking status: Never Smoker   . Smokeless tobacco: Never Used  . Alcohol Use: Yes     Comment: RARE  . Drug Use: No  . Sexual Activity: Not on file   Other Topics Concern  . Not on file   Social History Narrative  . No narrative on file  Allergies  Allergen Reactions  . Morphine And Related Nausea And Vomiting and Other (See Comments)    SEVERE N/V AND MIGRAINES  . Sulfa Antibiotics Hives    Patient Active Problem List   Diagnosis Date Noted  . Onychomycosis 12/01/2012  . Diabetes mellitus 04/12/2012  . Arthritis 04/12/2012  . Hypothyroid 04/12/2012    Results for orders placed in visit on 03/27/13  URINE CULTURE      Result Value Range   Colony Count NO GROWTH     Organism ID, Bacteria NO GROWTH    POCT URINALYSIS DIPSTICK      Result Value Range   Color, UA yellow     Clarity, UA clear     Glucose, UA neg     Bilirubin, UA neg     Ketones, UA neg     Spec Grav, UA 1.020     Blood, UA trace-intact     pH, UA 5.5     Protein, UA neg     Urobilinogen, UA 0.2     Nitrite, UA neg     Leukocytes, UA Negative    POCT  UA - MICROSCOPIC ONLY      Result Value Range   WBC, Ur, HPF, POC neg     RBC, urine, microscopic 0-1     Bacteria, U Microscopic trace     Mucus, UA neg     Epithelial cells, urine per micros neg     Crystals, Ur, HPF, POC neg     Casts, Ur, LPF, POC neg     Yeast, UA neg      No diagnosis found.  Meds ordered this encounter  Medications  . Probiotic Product (PRO-BIOTIC BLEND) CAPS    Sig: Take by mouth.  . trimethoprim (TRIMPEX) 100 MG tablet    Sig: Take 100 mg by mouth 2 (two) times daily.     Review of Systems  Musculoskeletal:       Right hip pain       Objective:   Physical Exam  Nursing note and vitals reviewed. Constitutional: She is oriented to person, place, and time. She appears well-developed and well-nourished. No distress.  HENT:  Head: Normocephalic and atraumatic.  Neck: Neck supple. No tracheal deviation present.  Cardiovascular: Normal rate.   Pulmonary/Chest: Effort normal. No respiratory distress.  Musculoskeletal: Normal range of motion.  Neurological: She is alert and oriented to person, place, and time.  Skin: Skin is warm and dry.  Psychiatric: She has a normal mood and affect. Her behavior is normal.   there is decreased range of motion of the left hip with decreased internal and external rotation  Results for orders placed in visit on 05/25/13  POCT CBC      Result Value Range   WBC 6.3  4.6 - 10.2 K/uL   Lymph, poc 2.2  0.6 - 3.4   POC LYMPH PERCENT 35.6  10 - 50 %L   MID (cbc) 0.4  0 - 0.9   POC MID % 6.9  0 - 12 %M   POC Granulocyte 3.6  2 - 6.9   Granulocyte percent 57.5  37 - 80 %G   RBC 4.81  4.04 - 5.48 M/uL   Hemoglobin 13.7  12.2 - 16.2 g/dL   HCT, POC 54.0  98.1 - 47.9 %   MCV 92.1  80 - 97 fL   MCH, POC 28.5  27 - 31.2 pg   MCHC 30.9 (*) 31.8 - 35.4 g/dL  RDW, POC 14.2     Platelet Count, POC 204  142 - 424 K/uL   MPV 9.1  0 - 99.8 fL  GLUCOSE, POCT (MANUAL RESULT ENTRY)      Result Value Range   POC Glucose 88  70  - 99 mg/dl  POCT GLYCOSYLATED HEMOGLOBIN (HGB A1C)      Result Value Range   Hemoglobin A1C 6.3    POCT UA - MICROSCOPIC ONLY      Result Value Range   WBC, Ur, HPF, POC 0-1     RBC, urine, microscopic 0-1     Bacteria, U Microscopic neg     Mucus, UA neg     Epithelial cells, urine per micros 0-5     Crystals, Ur, HPF, POC neg     Casts, Ur, LPF, POC neg     Yeast, UA neg    POCT URINALYSIS DIPSTICK      Result Value Range   Color, UA light yellow     Clarity, UA clear     Glucose, UA neg     Bilirubin, UA neg     Ketones, UA neg     Spec Grav, UA <=1.005     Blood, UA neg     pH, UA 5.5     Protein, UA neg     Urobilinogen, UA 0.2     Nitrite, UA neg     Leukocytes, UA Negative     [facility] reading (PRIMARY) by  Dr. Cleta Alberts there are  degenerative changes around the hip joint.      Assessment & Plan:  Her diabetes is under great control. We'll continue Glucophage 500 mg a day she may be able to get off of this. She is going to see Dr. Dion Saucier to evaluate her left hip discomfort

## 2013-05-26 LAB — URINE CULTURE
Colony Count: NO GROWTH
Organism ID, Bacteria: NO GROWTH

## 2013-05-27 ENCOUNTER — Ambulatory Visit (INDEPENDENT_AMBULATORY_CARE_PROVIDER_SITE_OTHER): Payer: 59 | Admitting: Emergency Medicine

## 2013-05-27 DIAGNOSIS — G5680 Other specified mononeuropathies of unspecified upper limb: Secondary | ICD-10-CM

## 2013-05-27 DIAGNOSIS — G5681 Other specified mononeuropathies of right upper limb: Secondary | ICD-10-CM

## 2013-05-27 DIAGNOSIS — M542 Cervicalgia: Secondary | ICD-10-CM

## 2013-05-27 NOTE — Progress Notes (Signed)
[facility] [address], [place] [address] [phone]  Date:  05/27/2013   Name:  Debbie Hayes   DOB:  [birth year]   MRN:  [id number]  PCP:  Lucilla Edin, MD    Chief Complaint: No chief complaint on file.   History of Present Illness:  Debbie Hayes is a 62 y.o. very pleasant female patient who presents with the following:  History of fibromyalgia.  Has pain in the posterior right shoulder that radiates into the right neck and interferes with rotation of her head.  She has no history of injury or overuse.  No radiation of pain or neuro symptoms.  No improvement with over the counter medications or other home remedies. Denies other complaint or health concern today.   Patient Active Problem List   Diagnosis Date Noted  . Onychomycosis 12/01/2012  . Diabetes mellitus 04/12/2012  . Arthritis 04/12/2012  . Hypothyroid 04/12/2012    Past Medical History  Diagnosis Date  . Rotator cuff disorder LEFT SHOULDER RTC IMPINGMENT  . Hyperlipidemia   . Normal nuclear stress test 01-25-2009  . Disorder of inner ear CAUSES VERTIGO OCCASIONALLY  . Chronic migraine BOTOX INJECTION EVERY 3 MONTHS  . Fibromyalgia   . Hemorrhoid   . Constipation   . Spondylolisthesis of lumbar region   . GERD (gastroesophageal reflux disease)   . Hypothyroidism   . History of thyroid cancer 2009  S/P TOTAL THYROIDECTOMY AND RADIATION  . Diabetes mellitus   . 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 Insomnia   . PONV (postoperative nausea and vomiting) SEVERE  . Chronic pain in right shoulder   . Left shoulder pain   . OA (osteoarthritis) JOINTS  . Hypertension CARDIOLOGIST- DR Donnie Aho- WILL REQUEST LATE NOTE    DENIES S & S    Past Surgical History  Procedure Laterality Date  . Right knee closed manipulation  09-11-2010  . Total knee arthroplasty  07-16-2010    RIGHT  . Right knee arthroscopy/ partial lateral menisectomy/ tricompartment chondroplasty/  decompression cyst  10-26-2009  . Right knee arthroscopy  X2  BEFORE 2011  . Total thyroidectomy  2009    CANCER  (POST-OP BLEED)  AND RADIATION TX  . Left thumb joint replacement  2005    RIGHT DONE IN 2004  . Right shoulder arthroscopy  2010  &  2004  . Bilateral elbow surg.  1999  . Bilateral carpal tunnel release  1994  . Vaginal hysterectomy  1990  . Bilateral salpingoophectomy  1993    POST-OP URETER REPAIR 12 DAYS AFTER   . Tonsillectomy  1968  . Tibia cyst removed and orif leg fx  1958    History  Substance Use Topics  . Smoking status: Never Smoker   . Smokeless tobacco: Never Used  . Alcohol Use: Yes     Comment: RARE    No family history on file.  Allergies  Allergen Reactions  . Morphine And Related Nausea And Vomiting and Other (See Comments)    SEVERE N/V AND MIGRAINES  . Sulfa Antibiotics Hives    Medication list has been reviewed and updated.  Current Outpatient Prescriptions on File Prior to Visit  Medication Sig Dispense Refill  . acetaminophen (TYLENOL) 650 MG CR tablet Take 650 mg by mouth every 8 (eight) hours as needed.      Marland Kitchen aspirin 81 MG tablet Take 81 mg by mouth daily.      Marland Kitchen atovaquone-proguanil (MALARONE) 250-100  MG TABS Take 1 tablet by mouth daily.  30 tablet  0  . azelastine (ASTELIN) 137 MCG/SPRAY nasal spray Place 2 sprays into the nose 2 (two) times daily. Use in each nostril as directed  90 mL  3  . azithromycin (ZITHROMAX) 250 MG tablet Take 2 tabs PO x 1 dose, then 1 tab PO QD x 4 days  6 tablet  0  . cetirizine (ZYRTEC) 10 MG tablet Take 10 mg by mouth daily.      . ciclopirox (PENLAC) 8 % solution Apply topically at bedtime. Apply over nail and surrounding skin. Apply daily over previous coat. After seven (7) days, may remove with alcohol and continue cycle.  6.6 mL  5  . conjugated estrogens (PREMARIN) vaginal cream Place 0.25 Applicatorfuls vaginally 3 (three) times a week.  42.5 g  3  . eletriptan (RELPAX) 40 MG tablet One tablet  by mouth as needed for migraine headache.  If the headache improves and then returns, dose may be repeated after 2 hours have elapsed since first dose (do not exceed 80 mg per day). may repeat in 2 hours if necessary      . eletriptan (RELPAX) 40 MG tablet One tablet by mouth at onset of headache. May repeat in 2 hours if headache persists or recurs. may repeat in 2 hours if necessary  24 tablet  7  . esomeprazole (NEXIUM) 40 MG capsule Take 40 mg by mouth as needed.      Marland Kitchen FLECTOR 1.3 % PTCH PLACE 1 PATCH ONTO THE SKIN 2 TIMES DAILY.  180 patch  PRN  . frovatriptan (FROVA) 2.5 MG tablet Take 1 tablet (2.5 mg total) by mouth as needed for migraine. If recurs, may repeat after 2 hours. Max of 3 tabs in 24 hours.  30 tablet  3  . ibuprofen (ADVIL,MOTRIN) 200 MG tablet Take 200 mg by mouth every 6 (six) hours as needed.      . Lancets MISC True test lancets to check blood sugar bid dx code 250.00  100 each  11  . levothyroxine (SYNTHROID, LEVOTHROID) 150 MCG tablet Take 137 mcg by mouth every evening.      . lidocaine (LIDODERM) 5 % APPLY 1 PATCH FOR 12 OUT OF 24 HOURS A DAY  90 patch  0  . losartan (COZAAR) 50 MG tablet TAKE 1 TABLET BY MOUTH DAILY  90 tablet  3  . metaxalone (SKELAXIN) 800 MG tablet Take 1 tablet (800 mg total) by mouth as needed.  180 tablet  1  . metFORMIN (GLUCOPHAGE) 1000 MG tablet Take 1 tablet (1,000 mg total) by mouth 2 (two) times daily with a meal.  180 tablet  3  . Methylcellulose, Laxative, (CITRUCEL PO) Take 600 mg by mouth 2 (two) times daily.      . metoprolol succinate (TOPROL-XL) 25 MG 24 hr tablet TAKE 1/2 TABLET BY MOUTH DAILY  90 tablet  3  . metoprolol tartrate (LOPRESSOR) 12.5 mg TABS Take 12.5 mg by mouth every morning.      . nitrofurantoin, macrocrystal-monohydrate, (MACROBID) 100 MG capsule Take 1 capsule (100 mg total) by mouth 2 (two) times daily.  60 capsule  0  . omeprazole (PRILOSEC) 20 MG capsule Take 1 capsule (20 mg total) by mouth daily.  90 capsule   1  . OnabotulinumtoxinA (BOTOX IJ) Inject as directed. Every 3 months for migraines      . polyethylene glycol (MIRALAX / GLYCOLAX) packet Take 17 g by mouth as  needed.      . PredniSONE 10 MG KIT sterapred ds 12 day.  All together for each day in morning  48 each  0  . Probiotic Product (PRO-BIOTIC BLEND) CAPS Take by mouth.      . rosuvastatin (CRESTOR) 5 MG tablet Take 1 tablet (5 mg total) by mouth daily.  90 tablet  0  . sitaGLIPtin (JANUVIA) 100 MG tablet Take 1 tablet (100 mg total) by mouth every morning.  90 tablet  3  . terbinafine (LAMISIL) 250 MG tablet Take 1 tablet (250 mg total) by mouth daily.  90 tablet  0  . traZODone (DESYREL) 100 MG tablet Patient takes 1 and 1/4 tablet q hs  112 tablet  0  . triamcinolone (NASACORT) 55 MCG/ACT nasal inhaler Place 2 sprays into the nose as needed.  3 Inhaler  3  . trimethoprim (TRIMPEX) 100 MG tablet Take 100 mg by mouth 2 (two) times daily.      . TRUETEST TEST test strip USE AS DIRECTED  400 each  PRN  . VOLTAREN 1 % GEL APPLY 4 GRAMS 3-4 TIMES DAILY  1 Tube  5  . zolpidem (AMBIEN) 5 MG tablet Take 0.5-1 tablets (2.5-5 mg total) by mouth at bedtime as needed for sleep.  90 tablet  1   No current facility-administered medications on file prior to visit.    Review of Systems:  As per HPI, otherwise negative.    Physical Examination: There were no vitals filed for this visit. There were no vitals filed for this visit. There is no weight on file to calculate BMI. Ideal Body Weight:     GEN: WDWN, NAD, Non-toxic, Alert & Oriented x 3 HEENT: Atraumatic, Normocephalic.  Ears and Nose: No external deformity. EXTR: No clubbing/cyanosis/edema NEURO: Normal gait.  PSYCH: Normally interactive. Conversant. Not depressed or anxious appearing.  Calm demeanor.  BACK:  Trigger point medial to scapula and superior to angle.  Neuro intact  Assessment and Plan: Scapulocostal syndrome Injection trigger point 80 mg depo medrol and marcaine 1  ml. Local heat. Usual muscle relaxer, NSAID and pain meds.  Signed,  Phillips Odor, MD

## 2013-06-02 ENCOUNTER — Telehealth: Payer: Self-pay

## 2013-06-02 DIAGNOSIS — M25511 Pain in right shoulder: Secondary | ICD-10-CM

## 2013-06-02 NOTE — Telephone Encounter (Signed)
**  Dr. Frederik Pear wife** Pt saw Dr. Dareen Piano for shoulder pain on 10/31 and would like to be referred to physical therapy at the [facility] on [address]. Her number is [phone]

## 2013-06-02 NOTE — Telephone Encounter (Signed)
Can we refer? 

## 2013-06-03 NOTE — Telephone Encounter (Signed)
Sure please do

## 2013-06-04 NOTE — Telephone Encounter (Signed)
Done

## 2013-06-05 NOTE — Telephone Encounter (Signed)
Spoke with pt advised PT referral has been sent.

## 2013-06-07 ENCOUNTER — Ambulatory Visit: Payer: 59 | Attending: Emergency Medicine | Admitting: Physical Therapy

## 2013-06-07 DIAGNOSIS — M25519 Pain in unspecified shoulder: Secondary | ICD-10-CM | POA: Insufficient documentation

## 2013-06-07 DIAGNOSIS — IMO0001 Reserved for inherently not codable concepts without codable children: Secondary | ICD-10-CM | POA: Insufficient documentation

## 2013-06-07 DIAGNOSIS — M542 Cervicalgia: Secondary | ICD-10-CM | POA: Insufficient documentation

## 2013-06-13 ENCOUNTER — Other Ambulatory Visit: Payer: Self-pay | Admitting: Emergency Medicine

## 2013-06-13 ENCOUNTER — Ambulatory Visit: Payer: 59 | Admitting: Physical Therapy

## 2013-06-15 ENCOUNTER — Ambulatory Visit: Payer: 59 | Admitting: Physical Therapy

## 2013-06-17 ENCOUNTER — Ambulatory Visit: Payer: 59 | Admitting: Physical Therapy

## 2013-06-21 ENCOUNTER — Ambulatory Visit: Payer: 59 | Admitting: Physical Therapy

## 2013-06-27 ENCOUNTER — Ambulatory Visit: Payer: 59 | Attending: Emergency Medicine | Admitting: Physical Therapy

## 2013-06-27 DIAGNOSIS — IMO0001 Reserved for inherently not codable concepts without codable children: Secondary | ICD-10-CM | POA: Insufficient documentation

## 2013-06-27 DIAGNOSIS — M542 Cervicalgia: Secondary | ICD-10-CM | POA: Insufficient documentation

## 2013-06-27 DIAGNOSIS — M25519 Pain in unspecified shoulder: Secondary | ICD-10-CM | POA: Insufficient documentation

## 2013-06-29 ENCOUNTER — Ambulatory Visit: Payer: 59 | Admitting: Physical Therapy

## 2013-07-01 ENCOUNTER — Ambulatory Visit: Payer: 59 | Admitting: Physical Therapy

## 2013-07-05 ENCOUNTER — Ambulatory Visit: Payer: 59 | Admitting: Physical Therapy

## 2013-07-06 ENCOUNTER — Ambulatory Visit: Payer: 59 | Admitting: Physical Therapy

## 2013-07-08 ENCOUNTER — Encounter: Payer: 59 | Admitting: Physical Therapy

## 2013-07-11 ENCOUNTER — Ambulatory Visit: Payer: 59 | Admitting: Physical Therapy

## 2013-07-13 ENCOUNTER — Ambulatory Visit: Payer: 59 | Admitting: Physical Therapy

## 2013-07-15 ENCOUNTER — Ambulatory Visit: Payer: 59 | Admitting: Physical Therapy

## 2013-07-20 ENCOUNTER — Ambulatory Visit: Payer: 59 | Admitting: Physical Therapy

## 2013-07-23 ENCOUNTER — Other Ambulatory Visit: Payer: Self-pay | Admitting: Emergency Medicine

## 2013-07-23 ENCOUNTER — Ambulatory Visit (INDEPENDENT_AMBULATORY_CARE_PROVIDER_SITE_OTHER): Payer: 59 | Admitting: Physician Assistant

## 2013-07-23 VITALS — Temp 100.0°F

## 2013-07-23 DIAGNOSIS — R062 Wheezing: Secondary | ICD-10-CM

## 2013-07-23 DIAGNOSIS — B349 Viral infection, unspecified: Secondary | ICD-10-CM

## 2013-07-23 DIAGNOSIS — B9789 Other viral agents as the cause of diseases classified elsewhere: Secondary | ICD-10-CM

## 2013-07-23 DIAGNOSIS — R509 Fever, unspecified: Secondary | ICD-10-CM

## 2013-07-23 DIAGNOSIS — R059 Cough, unspecified: Secondary | ICD-10-CM

## 2013-07-23 DIAGNOSIS — R05 Cough: Secondary | ICD-10-CM

## 2013-07-23 MED ORDER — ALBUTEROL SULFATE HFA 108 (90 BASE) MCG/ACT IN AERS: 2.0000 | INHALATION_SPRAY | RESPIRATORY_TRACT | Status: DC | PRN

## 2013-07-23 MED ORDER — BENZONATATE 100 MG PO CAPS
100.0000 mg | ORAL_CAPSULE | Freq: Three times a day (TID) | ORAL | Status: DC
Start: 2013-07-23 — End: 2013-10-14

## 2013-07-23 MED ORDER — BENZONATATE 100 MG PO CAPS: 100.0000 mg | ORAL_CAPSULE | Freq: Three times a day (TID) | ORAL | Status: DC | PRN

## 2013-07-23 MED ORDER — OSELTAMIVIR PHOSPHATE 75 MG PO CAPS: 75.0000 mg | ORAL_CAPSULE | Freq: Two times a day (BID) | ORAL | Status: DC

## 2013-07-24 LAB — POCT INFLUENZA A/B
Influenza A, POC: NEGATIVE
Influenza B, POC: NEGATIVE

## 2013-07-24 NOTE — Progress Notes (Signed)
   Subjective:    Patient ID: Silvana Newness, female    DOB: 04/19/1951, 62 y.o.   MRN: [id number]  HPI 62 year old female presents for evaluation of acute onset of fever, chills, cough, and slight nasal congestion.  Coughing started yesterday and has progressively worsened.  Fever was low grade this morning and is now up to 100.2.  She has not taken any OTC medications for this yet.  Denies SOB, chest pain, nausea, vomiting, headache, otalgia, sinus pain, or sore throat.  Patient is otherwise doing well with no other concerns today.     Review of Systems  Constitutional: Positive for fever, chills and fatigue.  HENT: Positive for congestion. Negative for ear pain, sinus pressure and sore throat.   Respiratory: Positive for cough and wheezing. Negative for chest tightness and shortness of breath.   Gastrointestinal: Negative for nausea and vomiting.  Neurological: Negative for dizziness and headaches.       Objective:   Physical Exam  Constitutional: She is oriented to person, place, and time. She appears well-developed and well-nourished.  HENT:  Head: Normocephalic and atraumatic.  Right Ear: External ear normal.  Left Ear: External ear normal.  Eyes: Conjunctivae are normal.  Neck: Normal range of motion.  Cardiovascular: Normal rate, regular rhythm and normal heart sounds.   Pulmonary/Chest: Effort normal. She has wheezes.  Neurological: She is alert and oriented to person, place, and time.  Psychiatric: She has a normal mood and affect. Her behavior is normal. Judgment and thought content normal.       Results for orders placed in visit on 07/23/13  POCT INFLUENZA A/B      Result Value Range   Influenza A, POC Negative     Influenza B, POC Negative         Assessment & Plan:  Viral illness - Plan: oseltamivir (TAMIFLU) 75 MG capsule  Wheezing - Plan: albuterol (PROVENTIL HFA;VENTOLIN HFA) 108 (90 BASE) MCG/ACT inhaler  Fever, unspecified  Cough - Plan:  benzonatate (TESSALON) 100 MG capsule  Flu test negative today, but clinically appears to be influenza.  Start Tamiflu 75 mg twice daily x 5 days Tessalon perles tid prn cough Pt declined rx for Hycodan. Recommend plain Mucinex to help with congestion and cough Continue asthmanex daily. Start albuterol q4-6hours prn wheezing, SOB.  RTC precautions discussed Follow up if symptoms worsen or fail to imrpove

## 2013-07-26 ENCOUNTER — Ambulatory Visit: Payer: 59 | Admitting: Physical Therapy

## 2013-08-04 ENCOUNTER — Other Ambulatory Visit: Payer: Self-pay | Admitting: Family Medicine

## 2013-08-05 NOTE — Telephone Encounter (Signed)
Dr Everlene Farrier, Dr Carlota Raspberry Rxd this for pt over a year ago d/t nausea when she takes pain or migraine meds. Do you want to RF?

## 2013-08-11 ENCOUNTER — Ambulatory Visit: Payer: 59 | Attending: Emergency Medicine | Admitting: Physical Therapy

## 2013-08-11 DIAGNOSIS — M25519 Pain in unspecified shoulder: Secondary | ICD-10-CM | POA: Insufficient documentation

## 2013-08-11 DIAGNOSIS — M542 Cervicalgia: Secondary | ICD-10-CM | POA: Insufficient documentation

## 2013-08-11 DIAGNOSIS — IMO0001 Reserved for inherently not codable concepts without codable children: Secondary | ICD-10-CM | POA: Insufficient documentation

## 2013-08-17 ENCOUNTER — Ambulatory Visit: Payer: 59 | Admitting: Physical Therapy

## 2013-08-19 ENCOUNTER — Other Ambulatory Visit: Payer: Self-pay | Admitting: Emergency Medicine

## 2013-08-23 ENCOUNTER — Ambulatory Visit: Payer: 59 | Admitting: Physical Therapy

## 2013-08-31 ENCOUNTER — Ambulatory Visit: Payer: 59 | Attending: Emergency Medicine | Admitting: Physical Therapy

## 2013-08-31 DIAGNOSIS — IMO0001 Reserved for inherently not codable concepts without codable children: Secondary | ICD-10-CM | POA: Insufficient documentation

## 2013-08-31 DIAGNOSIS — M542 Cervicalgia: Secondary | ICD-10-CM | POA: Insufficient documentation

## 2013-08-31 DIAGNOSIS — M25519 Pain in unspecified shoulder: Secondary | ICD-10-CM | POA: Insufficient documentation

## 2013-09-08 ENCOUNTER — Ambulatory Visit: Payer: 59 | Admitting: Physical Therapy

## 2013-09-22 ENCOUNTER — Other Ambulatory Visit: Payer: Self-pay | Admitting: Emergency Medicine

## 2013-09-23 ENCOUNTER — Ambulatory Visit: Payer: 59 | Admitting: Physical Therapy

## 2013-09-24 ENCOUNTER — Other Ambulatory Visit: Payer: Self-pay | Admitting: Family Medicine

## 2013-09-24 MED ORDER — TRIMETHOPRIM 100 MG PO TABS: 100.0000 mg | ORAL_TABLET | Freq: Every day | ORAL | Status: DC

## 2013-09-26 ENCOUNTER — Other Ambulatory Visit: Payer: Self-pay | Admitting: Emergency Medicine

## 2013-09-27 ENCOUNTER — Other Ambulatory Visit: Payer: Self-pay | Admitting: Radiology

## 2013-10-04 ENCOUNTER — Other Ambulatory Visit: Payer: Self-pay | Admitting: Emergency Medicine

## 2013-10-04 ENCOUNTER — Telehealth: Payer: Self-pay | Admitting: Emergency Medicine

## 2013-10-04 MED ORDER — AZITHROMYCIN 250 MG PO TABS: ORAL_TABLET | ORAL | Status: DC

## 2013-10-04 NOTE — Telephone Encounter (Signed)
Patient called with purulent bloody drainage from the right sinus. We'll send him a prescription for Z-Pak

## 2013-10-14 ENCOUNTER — Ambulatory Visit (INDEPENDENT_AMBULATORY_CARE_PROVIDER_SITE_OTHER): Payer: 59 | Admitting: Emergency Medicine

## 2013-10-14 VITALS — BP 100/66 | HR 63 | Temp 98.1°F | Resp 16 | Ht 67.5 in | Wt 180.0 lb

## 2013-10-14 DIAGNOSIS — E119 Type 2 diabetes mellitus without complications: Secondary | ICD-10-CM

## 2013-10-14 DIAGNOSIS — G4700 Insomnia, unspecified: Secondary | ICD-10-CM

## 2013-10-14 DIAGNOSIS — E785 Hyperlipidemia, unspecified: Secondary | ICD-10-CM | POA: Insufficient documentation

## 2013-10-14 DIAGNOSIS — M62838 Other muscle spasm: Secondary | ICD-10-CM

## 2013-10-14 DIAGNOSIS — L989 Disorder of the skin and subcutaneous tissue, unspecified: Secondary | ICD-10-CM

## 2013-10-14 DIAGNOSIS — J309 Allergic rhinitis, unspecified: Secondary | ICD-10-CM

## 2013-10-14 DIAGNOSIS — N390 Urinary tract infection, site not specified: Secondary | ICD-10-CM

## 2013-10-14 DIAGNOSIS — E039 Hypothyroidism, unspecified: Secondary | ICD-10-CM

## 2013-10-14 LAB — POCT CBC
GRANULOCYTE PERCENT: 56 % (ref 37–80)
HCT, POC: 43.7 % (ref 37.7–47.9)
HEMOGLOBIN: 14 g/dL (ref 12.2–16.2)
Lymph, poc: 2 (ref 0.6–3.4)
MCH, POC: 28.8 pg (ref 27–31.2)
MCHC: 32 g/dL (ref 31.8–35.4)
MCV: 89.9 fL (ref 80–97)
MID (cbc): 0.4 (ref 0–0.9)
MPV: 9.7 fL (ref 0–99.8)
POC GRANULOCYTE: 3.1 (ref 2–6.9)
POC LYMPH PERCENT: 36.7 %L (ref 10–50)
POC MID %: 7.3 %M (ref 0–12)
Platelet Count, POC: 242 10*3/uL (ref 142–424)
RBC: 4.86 M/uL (ref 4.04–5.48)
RDW, POC: 13.9 %
WBC: 5.5 10*3/uL (ref 4.6–10.2)

## 2013-10-14 LAB — COMPREHENSIVE METABOLIC PANEL
ALK PHOS: 64 U/L (ref 39–117)
ALT: 14 U/L (ref 0–35)
AST: 18 U/L (ref 0–37)
Albumin: 4.4 g/dL (ref 3.5–5.2)
BILIRUBIN TOTAL: 0.3 mg/dL (ref 0.2–1.2)
BUN: 17 mg/dL (ref 6–23)
CO2: 27 mEq/L (ref 19–32)
CREATININE: 0.66 mg/dL (ref 0.50–1.10)
Calcium: 9.8 mg/dL (ref 8.4–10.5)
Chloride: 101 mEq/L (ref 96–112)
Glucose, Bld: 112 mg/dL — ABNORMAL HIGH (ref 70–99)
Potassium: 4.4 mEq/L (ref 3.5–5.3)
Sodium: 138 mEq/L (ref 135–145)
TOTAL PROTEIN: 7.1 g/dL (ref 6.0–8.3)

## 2013-10-14 LAB — LIPID PANEL
Cholesterol: 240 mg/dL — ABNORMAL HIGH (ref 0–200)
HDL: 48 mg/dL
LDL Cholesterol: 158 mg/dL — ABNORMAL HIGH (ref 0–99)
Total CHOL/HDL Ratio: 5 ratio
Triglycerides: 170 mg/dL — ABNORMAL HIGH
VLDL: 34 mg/dL (ref 0–40)

## 2013-10-14 LAB — GLUCOSE, POCT (MANUAL RESULT ENTRY): POC GLUCOSE: 102 mg/dL — AB (ref 70–99)

## 2013-10-14 LAB — POCT GLYCOSYLATED HEMOGLOBIN (HGB A1C): Hemoglobin A1C: 6.9

## 2013-10-14 MED ORDER — LEVOTHYROXINE SODIUM 150 MCG PO TABS: 137.0000 ug | ORAL_TABLET | Freq: Every evening | ORAL | Status: DC

## 2013-10-14 MED ORDER — AZELASTINE HCL 0.1 % NA SOLN: 2.0000 | Freq: Two times a day (BID) | NASAL | Status: DC

## 2013-10-14 MED ORDER — TRAZODONE HCL 100 MG PO TABS: 100.0000 mg | ORAL_TABLET | Freq: Every day | ORAL | Status: DC

## 2013-10-14 MED ORDER — MOMETASONE FUROATE 50 MCG/ACT NA SUSP: 2.0000 | Freq: Every day | NASAL | Status: DC

## 2013-10-14 MED ORDER — OXYCODONE HCL 5 MG PO TABS: ORAL_TABLET | ORAL | Status: DC

## 2013-10-14 MED ORDER — MOMETASONE FUROATE 220 MCG/INH IN AEPB: 2.0000 | INHALATION_SPRAY | Freq: Every day | RESPIRATORY_TRACT | Status: DC

## 2013-10-14 MED ORDER — METAXALONE 800 MG PO TABS: 800.0000 mg | ORAL_TABLET | ORAL | Status: DC | PRN

## 2013-10-14 MED ORDER — ZOLPIDEM TARTRATE 5 MG PO TABS: ORAL_TABLET | ORAL | Status: DC

## 2013-10-14 NOTE — Progress Notes (Deleted)
   Subjective:    Patient ID: Debbie Hayes, female    DOB: [birth year], 63 y.o.   MRN: [id number]  HPI    Review of Systems     Objective:   Physical Exam        Assessment & Plan:

## 2013-10-14 NOTE — Progress Notes (Signed)
   Subjective:    Patient ID: Debbie Hayes, female    DOB: [birth year], 63 y.o.   MRN: [id number]  HPI patient overall has been doing well. She continues to adhere to her diet. Taking the  trimethoprim 100 mg one a day to prevent urinary tract infections has helped her. She has been watching her diet. She has not been exercising much. She has kept her weight stable. She is requesting treatment for a scaly irritated area on the back of her neck.    Review of Systems     Objective:   Physical Exam patient is alert and cooperative. Her neck is supple. Chest clear heart regular rate no murmurs extremity exam reveals normal sensation pulses 2+  Results for orders placed in visit on 10/14/13  POCT CBC      Result Value Ref Range   WBC 5.5  4.6 - 10.2 K/uL   Lymph, poc 2.0  0.6 - 3.4   POC LYMPH PERCENT 36.7  10 - 50 %L   MID (cbc) 0.4  0 - 0.9   POC MID % 7.3  0 - 12 %M   POC Granulocyte 3.1  2 - 6.9   Granulocyte percent 56.0  37 - 80 %G   RBC 4.86  4.04 - 5.48 M/uL   Hemoglobin 14.0  12.2 - 16.2 g/dL   HCT, POC 43.7  37.7 - 47.9 %   MCV 89.9  80 - 97 fL   MCH, POC 28.8  27 - 31.2 pg   MCHC 32.0  31.8 - 35.4 g/dL   RDW, POC 13.9     Platelet Count, POC 242  142 - 424 K/uL   MPV 9.7  0 - 99.8 fL  GLUCOSE, POCT (MANUAL RESULT ENTRY)      Result Value Ref Range   POC Glucose 102 (*) 70 - 99 mg/dl  POCT GLYCOSYLATED HEMOGLOBIN (HGB A1C)      Result Value Ref Range   Hemoglobin A1C 6.9          Assessment & Plan:   Her hemoglobin A1c has risen to 6.9 I encouraged her to take her metformin more regularly. All medications were refilled. She is not currently on a statin . Referral made to cardiology for screening purposes. The area on her back of her neck was treated with cryotherapy for 8 seconds x2. She really needs to take her metformin more regularly.

## 2013-10-15 LAB — T4, FREE: Free T4: 1.23 ng/dL (ref 0.80–1.80)

## 2013-10-15 LAB — TSH: TSH: 1.231 u[IU]/mL (ref 0.350–4.500)

## 2013-10-17 LAB — THYROGLOBULIN LEVEL: Thyroglobulin: <0.2 ng/mL (ref 0.0–55.0)

## 2013-10-26 ENCOUNTER — Telehealth: Payer: Self-pay

## 2013-10-26 NOTE — Telephone Encounter (Signed)
Fax from pharm asked for verification of strength of levothyroxine. We had sent in Rx for 150 mcg but pt stated that she has only been taking 137 mcg and not aware that it was supposed to change. Our records only show 150 mcg, but we don't have any recent Rxs for her. Called pharm and they stated that Dr Almetta Lovely office had already called them and clarified that pt should be taking the 137 mcg QD and they are Rxing for pt.

## 2013-10-31 ENCOUNTER — Other Ambulatory Visit: Payer: Self-pay | Admitting: Emergency Medicine

## 2013-10-31 ENCOUNTER — Other Ambulatory Visit: Payer: Self-pay | Admitting: Physician Assistant

## 2013-11-02 ENCOUNTER — Other Ambulatory Visit: Payer: Self-pay | Admitting: Emergency Medicine

## 2013-11-02 MED ORDER — TRAMADOL HCL 50 MG PO TABS: 50.0000 mg | ORAL_TABLET | Freq: Three times a day (TID) | ORAL | Status: DC | PRN

## 2013-11-02 NOTE — Telephone Encounter (Signed)
Patient called requesting refill of Ultram because of significant joint pain.

## 2013-11-02 NOTE — Telephone Encounter (Signed)
Per Dr Everlene Farrier, [facility] to RF the [place] sol. Sent that Rx and called in Tramadol. Called and spoke to pt's husband, Dr Linna Darner, who stated he will relay message but that pt is no longer taking any migraine meds.

## 2013-11-18 ENCOUNTER — Other Ambulatory Visit: Payer: Self-pay | Admitting: Emergency Medicine

## 2013-11-18 MED ORDER — ATOVAQUONE-PROGUANIL HCL 250-100 MG PO TABS: 1.0000 | ORAL_TABLET | Freq: Every day | ORAL | Status: DC

## 2013-11-28 ENCOUNTER — Ambulatory Visit: Payer: 59 | Admitting: Cardiovascular Disease

## 2014-01-13 ENCOUNTER — Other Ambulatory Visit: Payer: Self-pay | Admitting: Emergency Medicine

## 2014-01-13 DIAGNOSIS — G4700 Insomnia, unspecified: Secondary | ICD-10-CM

## 2014-01-13 MED ORDER — TRAZODONE HCL 100 MG PO TABS: ORAL_TABLET | ORAL | Status: DC

## 2014-01-13 MED ORDER — MELOXICAM 7.5 MG PO TABS
7.5000 mg | ORAL_TABLET | Freq: Every day | ORAL | Status: DC
Start: 2014-01-13 — End: 2014-02-17

## 2014-01-17 ENCOUNTER — Other Ambulatory Visit: Payer: Self-pay | Admitting: Emergency Medicine

## 2014-01-26 ENCOUNTER — Encounter (INDEPENDENT_AMBULATORY_CARE_PROVIDER_SITE_OTHER): Payer: 59 | Admitting: Ophthalmology

## 2014-02-06 ENCOUNTER — Encounter (INDEPENDENT_AMBULATORY_CARE_PROVIDER_SITE_OTHER): Payer: 59 | Admitting: Ophthalmology

## 2014-02-06 DIAGNOSIS — E11319 Type 2 diabetes mellitus with unspecified diabetic retinopathy without macular edema: Secondary | ICD-10-CM

## 2014-02-06 DIAGNOSIS — I10 Essential (primary) hypertension: Secondary | ICD-10-CM

## 2014-02-06 DIAGNOSIS — E1139 Type 2 diabetes mellitus with other diabetic ophthalmic complication: Secondary | ICD-10-CM

## 2014-02-06 DIAGNOSIS — H43819 Vitreous degeneration, unspecified eye: Secondary | ICD-10-CM

## 2014-02-06 DIAGNOSIS — H35039 Hypertensive retinopathy, unspecified eye: Secondary | ICD-10-CM

## 2014-02-06 DIAGNOSIS — H3530 Unspecified macular degeneration: Secondary | ICD-10-CM

## 2014-02-06 DIAGNOSIS — E1165 Type 2 diabetes mellitus with hyperglycemia: Secondary | ICD-10-CM

## 2014-02-06 DIAGNOSIS — H2510 Age-related nuclear cataract, unspecified eye: Secondary | ICD-10-CM

## 2014-02-17 ENCOUNTER — Ambulatory Visit (INDEPENDENT_AMBULATORY_CARE_PROVIDER_SITE_OTHER): Payer: 59 | Admitting: Emergency Medicine

## 2014-02-17 VITALS — BP 122/74 | HR 76 | Temp 98.2°F | Resp 16 | Ht 67.75 in | Wt 177.8 lb

## 2014-02-17 DIAGNOSIS — M5136 Other intervertebral disc degeneration, lumbar region: Secondary | ICD-10-CM

## 2014-02-17 DIAGNOSIS — H3530 Unspecified macular degeneration: Secondary | ICD-10-CM | POA: Insufficient documentation

## 2014-02-17 DIAGNOSIS — Z202 Contact with and (suspected) exposure to infections with a predominantly sexual mode of transmission: Secondary | ICD-10-CM

## 2014-02-17 DIAGNOSIS — E785 Hyperlipidemia, unspecified: Secondary | ICD-10-CM

## 2014-02-17 DIAGNOSIS — M25529 Pain in unspecified elbow: Secondary | ICD-10-CM

## 2014-02-17 DIAGNOSIS — M5137 Other intervertebral disc degeneration, lumbosacral region: Secondary | ICD-10-CM

## 2014-02-17 DIAGNOSIS — Z1211 Encounter for screening for malignant neoplasm of colon: Secondary | ICD-10-CM

## 2014-02-17 DIAGNOSIS — M549 Dorsalgia, unspecified: Secondary | ICD-10-CM | POA: Insufficient documentation

## 2014-02-17 DIAGNOSIS — M545 Low back pain, unspecified: Secondary | ICD-10-CM

## 2014-02-17 DIAGNOSIS — M25522 Pain in left elbow: Secondary | ICD-10-CM

## 2014-02-17 DIAGNOSIS — M51369 Other intervertebral disc degeneration, lumbar region without mention of lumbar back pain or lower extremity pain: Secondary | ICD-10-CM

## 2014-02-17 DIAGNOSIS — E119 Type 2 diabetes mellitus without complications: Secondary | ICD-10-CM

## 2014-02-17 LAB — POCT CBC
Granulocyte percent: 54.5 %G (ref 37–80)
HCT, POC: 43.3 % (ref 37.7–47.9)
Hemoglobin: 14.2 g/dL (ref 12.2–16.2)
Lymph, poc: 1.8 (ref 0.6–3.4)
MCH, POC: 28.3 pg (ref 27–31.2)
MCHC: 32.7 g/dL (ref 31.8–35.4)
MCV: 86.7 fL (ref 80–97)
MID (cbc): 0.5 (ref 0–0.9)
MPV: 7.5 fL (ref 0–99.8)
POC Granulocyte: 2.7 (ref 2–6.9)
POC LYMPH %: 36.3 % (ref 10–50)
POC MID %: 9.2 %M (ref 0–12)
Platelet Count, POC: 203 10*3/uL (ref 142–424)
RBC: 5 M/uL (ref 4.04–5.48)
RDW, POC: 13.7 %
WBC: 4.9 10*3/uL (ref 4.6–10.2)

## 2014-02-17 LAB — POCT GLYCOSYLATED HEMOGLOBIN (HGB A1C): Hemoglobin A1C: 6.4

## 2014-02-17 LAB — GLUCOSE, POCT (MANUAL RESULT ENTRY): POC GLUCOSE: 116 mg/dL — AB (ref 70–99)

## 2014-02-17 NOTE — Patient Instructions (Signed)
Back Pain, Adult Low back pain is very common. About 1 in 5 people have back pain.The cause of low back pain is rarely dangerous. The pain often gets better over time.About half of people with a sudden onset of back pain feel better in just 2 weeks. About 8 in 10 people feel better by 6 weeks.  CAUSES Some common causes of back pain include:  Strain of the muscles or ligaments supporting the spine.  Wear and tear (degeneration) of the spinal discs.  Arthritis.  Direct injury to the back. DIAGNOSIS Most of the time, the direct cause of low back pain is not known.However, back pain can be treated effectively even when the exact cause of the pain is unknown.Answering your caregiver's questions about your overall health and symptoms is one of the most accurate ways to make sure the cause of your pain is not dangerous. If your caregiver needs more information, he or she may order lab work or imaging tests (X-rays or MRIs).However, even if imaging tests show changes in your back, this usually does not require surgery. HOME CARE INSTRUCTIONS For many people, back pain returns.Since low back pain is rarely dangerous, it is often a condition that people can learn to manageon their own.   Remain active. It is stressful on the back to sit or stand in one place. Do not sit, drive, or stand in one place for more than 30 minutes at a time. Take short walks on level surfaces as soon as pain allows.Try to increase the length of time you walk each day.  Do not stay in bed.Resting more than 1 or 2 days can delay your recovery.  Do not avoid exercise or work.Your body is made to move.It is not dangerous to be active, even though your back may hurt.Your back will likely heal faster if you return to being active before your pain is gone.  Pay attention to your body when you bend and lift. Many people have less discomfortwhen lifting if they bend their knees, keep the load close to their bodies,and  avoid twisting. Often, the most comfortable positions are those that put less stress on your recovering back.  Find a comfortable position to sleep. Use a firm mattress and lie on your side with your knees slightly bent. If you lie on your back, put a pillow under your knees.  Only take over-the-counter or prescription medicines as directed by your caregiver. Over-the-counter medicines to reduce pain and inflammation are often the most helpful.Your caregiver may prescribe muscle relaxant drugs.These medicines help dull your pain so you can more quickly return to your normal activities and healthy exercise.  Put ice on the injured area.  Put ice in a plastic bag.  Place a towel between your skin and the bag.  Leave the ice on for 15-20 minutes, 03-04 times a day for the first 2 to 3 days. After that, ice and heat may be alternated to reduce pain and spasms.  Ask your caregiver about trying back exercises and gentle massage. This may be of some benefit.  Avoid feeling anxious or stressed.Stress increases muscle tension and can worsen back pain.It is important to recognize when you are anxious or stressed and learn ways to manage it.Exercise is a great option. SEEK MEDICAL CARE IF:  You have pain that is not relieved with rest or medicine.  You have pain that does not improve in 1 week.  You have new symptoms.  You are generally not feeling well. SEEK   IMMEDIATE MEDICAL CARE IF:   You have pain that radiates from your back into your legs.  You develop new bowel or bladder control problems.  You have unusual weakness or numbness in your arms or legs.  You develop nausea or vomiting.  You develop abdominal pain.  You feel faint. Document Released: 07/14/2005 Document Revised: 01/13/2012 Document Reviewed: 11/15/2013 ExitCare Patient Information 2015 [employer]. This information is not intended to replace advice given to you by your health care provider. Make sure you  discuss any questions you have with your health care provider.  

## 2014-02-17 NOTE — Progress Notes (Addendum)
Subjective:  This chart was scribed for Debbie Lipps A. Daub MD, by [name], [facility] Scribe. The patient was seen in room and the patient's care was started at 11:41 AM.  Chief Complaint  Patient presents with   Diabetes   Back Pain    x 2 mths     Patient ID: Debbie Hayes, female    DOB: April 11, 1951, 63 y.o.   MRN: [id number]  02/17/2014  Diabetes and Back Pain   HPI HPI Comments: Debbie Hayes is a 63 y.o. female who arrives to the [facility] complaining of constant mid to lower back pain for the past two months. The pain is worse with movement and to the touch. he pain is worse with trying to reach up her back. She complains of right upper left arm pain for the past four months. Pt has tried taking Ibuprofen and narcotics. Nothing seems to help. She had a MRI performed last year.   She was dx with macular degeneration last week. Pt's mother and father has a hx of macular degeneration. She is taking vitamins daily for treatment. Pt will have graphs performed monthly.Pt requests an Ambien prescription.  Pt request an HIV/ STD test after accidentally touching the head of an AIDS infected man's penis while in [place].  She requests a colonoscopy. She is taking Metofromin daily and has improvement.She has a past medicial hx of hyperlipemia, onychomycosis, arthritis and hypothyroid.    Patient Active Problem List   Diagnosis Date Noted   Other and unspecified hyperlipidemia 10/14/2013   Onychomycosis 12/01/2012   Diabetes mellitus 04/12/2012   Arthritis 04/12/2012   Hypothyroid 04/12/2012   Past Medical History  Diagnosis Date   Rotator cuff disorder LEFT SHOULDER RTC IMPINGMENT   Hyperlipidemia    Normal nuclear stress test 01-25-2009   Disorder of inner ear CAUSES VERTIGO OCCASIONALLY   Chronic migraine BOTOX INJECTION EVERY 3 MONTHS   Fibromyalgia    Hemorrhoid    Constipation    Spondylolisthesis of  lumbar region    GERD (gastroesophageal reflux disease)    Hypothyroidism    History of thyroid cancer 2009  S/P TOTAL THYROIDECTOMY AND RADIATION   Diabetes mellitus    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   Insomnia    PONV (postoperative nausea and vomiting) SEVERE   Chronic pain in right shoulder    Left shoulder pain    OA (osteoarthritis) JOINTS   Hypertension CARDIOLOGIST- DR Wynonia Lawman- WILL REQUEST LATE NOTE    DENIES S & S   Past Surgical History  Procedure Laterality Date   Right knee closed manipulation  09-11-2010   Total knee arthroplasty  07-16-2010    RIGHT   Right knee arthroscopy/ partial lateral menisectomy/ tricompartment chondroplasty/ decompression cyst  10-26-2009   Right knee arthroscopy  X2  BEFORE 2011   Total thyroidectomy  2009    CANCER  (POST-OP BLEED)  AND RADIATION TX   Left thumb joint replacement  2005    RIGHT DONE IN 2004   Right shoulder arthroscopy  2010  &  2004   Bilateral elbow surg.  1999   Bilateral carpal tunnel release  1994   Vaginal hysterectomy  1990   Bilateral salpingoophectomy  1993    POST-OP URETER REPAIR 12 DAYS AFTER    Tonsillectomy  1968   Tibia cyst removed and orif leg fx  1958   Allergies  Allergen Reactions   Morphine And Related Nausea And Vomiting  and Other (See Comments)    SEVERE N/V AND MIGRAINES   Sulfa Antibiotics Hives   Prior to Admission medications   Medication Sig Start Date End Date Taking? Authorizing Provider  acetaminophen (TYLENOL) 650 MG CR tablet Take 650 mg by mouth every 8 (eight) hours as needed.   Yes Historical Provider, MD  aspirin 81 MG tablet Take 81 mg by mouth daily.   Yes Historical Provider, MD  azelastine (ASTELIN) 137 MCG/SPRAY nasal spray Place 2 sprays into both nostrils 2 (two) times daily. Use in each nostril as directed 10/14/13  Yes [name], MD  cetirizine (ZYRTEC) 10 MG tablet Take 10 mg by mouth daily.   Yes Historical Provider, MD  ciclopirox  (PENLAC) 8 % solution APPLY TOPICALLY OVER NAIL AT BEDTIME. AFTER 7 DAYS MAY REMOVE WITH ALCOHOL AND REPEAT.   Yes [name], MD  conjugated estrogens (PREMARIN) vaginal cream Place 0.17 Applicatorfuls vaginally 3 (three) times a week. 12/29/11  Yes Angela M McClung, PA-C  CRESTOR 5 MG tablet TAKE 1 TABLET BY MOUTH ONCE DAILY   Yes Sarah L Weber, PA-C  FLECTOR 1.3 % PTCH PLACE 1 PATCH ONTO THE SKIN 2 TIMES DAILY. 05/05/13  Yes Eleanore E Elana Alm, PA-C  ibuprofen (ADVIL,MOTRIN) 200 MG tablet Take 200 mg by mouth every 6 (six) hours as needed.   Yes Historical Provider, MD  Lancets MISC True test lancets to check blood sugar bid dx code 250.00 04/21/13  Yes Eleanore E Egan, PA-C  levothyroxine (SYNTHROID, LEVOTHROID) 150 MCG tablet Take 1 tablet (150 mcg total) by mouth every evening. 10/14/13  Yes [name], MD  lidocaine (LIDODERM) 5 % APPLY 1 PATCH FOR 12 HOURS OUT OF 24 HOURS 01/17/14  Yes Mancel Bale, PA-C  losartan (COZAAR) 50 MG tablet TAKE 1 TABLET BY MOUTH DAILY 01/10/13  Yes Heather M Marte, PA-C  metaxalone (SKELAXIN) 800 MG tablet Take 1 tablet (800 mg total) by mouth as needed. 10/14/13  Yes [name], MD  metFORMIN (GLUCOPHAGE) 1000 MG tablet Take 500 mg by mouth 2 (two) times daily with a meal. 01/19/13  Yes [name], MD  metoprolol tartrate (LOPRESSOR) 12.5 mg TABS Take 12.5 mg by mouth every morning.   Yes Historical Provider, MD  omeprazole (PRILOSEC) 20 MG capsule TAKE 1 CAPSULE BY MOUTH DAILY 06/13/13  Yes Eleanore E Egan, PA-C  ondansetron (ZOFRAN-ODT) 4 MG disintegrating tablet DISSOLVE 1 TABLET BY MOUTH EVERY 8 HOURS AS NEEDED FOR NAUSEA. 08/04/13  Yes [name], MD  oxyCODONE (ROXICODONE) 5 MG immediate release tablet Take 1/2-1 tablet as needed for severe pain 10/14/13  Yes [name], MD  polyethylene glycol (MIRALAX / GLYCOLAX) packet Take 17 g by mouth as needed.   Yes Historical Provider, MD  traZODone (DESYREL) 100 MG tablet Take 1-1/2-2 tablets at bedtime  01/13/14  Yes [name], MD  triamcinolone (NASACORT) 55 MCG/ACT nasal inhaler Place 2 sprays into the nose as needed. 01/02/12  Yes [name], MD  trimethoprim (TRIMPEX) 100 MG tablet Take 1 tablet (100 mg total) by mouth daily. 09/24/13  Yes Thao P Le, DO  TRUETEST TEST test strip USE AS DIRECTED 04/21/13  Yes Theda Sers, PA-C  VOLTAREN 1 % GEL APPLY 4 GRAMS 3-4 TIMES DAILY 03/09/12  Yes Ryan M Dunn, PA-C  zolpidem (AMBIEN) 5 MG tablet Take one half to one tablet at bedtime for sleep 10/14/13  Yes [name], MD  metoprolol succinate (  TOPROL-XL) 25 MG 24 hr tablet TAKE 1/2 TABLET BY MOUTH DAILY 01/10/13   Heather M Marte, PA-C  mometasone [facility]) 220 MCG/INH inhaler Inhale 2 puffs into the lungs daily. 10/14/13   [name], MD  mometasone (NASONEX) 50 MCG/ACT nasal spray Place 2 sprays into the nose daily. 10/14/13   [name], MD  Probiotic Product (PRO-BIOTIC BLEND) CAPS Take by mouth.    Historical Provider, MD  traMADol (ULTRAM) 50 MG tablet Take 1 tablet (50 mg total) by mouth every 8 (eight) hours as needed. 11/02/13   [name], MD   History   Social History   Marital Status: Married    Spouse Name: N/A    Number of Children: N/A   Years of Education: N/A   Occupational History   Not on file.   Social History Main Topics   Smoking status: Never Smoker    Smokeless tobacco: Never Used   Alcohol Use: Yes     Comment: RARE   Drug Use: No   Sexual Activity: Not on file   Other Topics Concern   Not on file   Social History Narrative   No narrative on file     Review of Systems  Past Medical History  Diagnosis Date   Rotator cuff disorder LEFT SHOULDER RTC IMPINGMENT   Hyperlipidemia    Normal nuclear stress test 01-25-2009   Disorder of inner ear CAUSES VERTIGO OCCASIONALLY   Chronic migraine BOTOX INJECTION EVERY 3 MONTHS   Fibromyalgia    Hemorrhoid    Constipation    Spondylolisthesis of lumbar region    GERD  (gastroesophageal reflux disease)    Hypothyroidism    History of thyroid cancer 2009  S/P TOTAL THYROIDECTOMY AND RADIATION   Diabetes mellitus    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       aspirin 81 MG tablet Take 81 mg by mouth daily.       azelastine (ASTELIN) 137 MCG/SPRAY nasal spray Place 2 sprays into both nostrils 2 (two) times daily. Use in each nostril as directed  90 mL  3   cetirizine (ZYRTEC) 10 MG tablet Take 10 mg by mouth daily.       ciclopirox (PENLAC) 8 % solution APPLY TOPICALLY OVER NAIL AT BEDTIME. AFTER 7 DAYS MAY REMOVE WITH ALCOHOL AND REPEAT.  6.6 mL  PRN   conjugated estrogens (PREMARIN) vaginal cream Place 8.29 Applicatorfuls vaginally 3 (three) times a week.  42.5 g  3   CRESTOR 5 MG tablet TAKE 1 TABLET BY MOUTH ONCE DAILY  90 tablet  PRN   FLECTOR 1.3 % PTCH PLACE 1 PATCH ONTO THE SKIN 2 TIMES DAILY.  180 patch  PRN   ibuprofen (ADVIL,MOTRIN) 200 MG tablet Take 200 mg by mouth every 6 (six) hours as needed.       Lancets MISC True  test lancets to check blood sugar bid dx code 250.00  100 each  11   levothyroxine (SYNTHROID, LEVOTHROID) 150 MCG tablet Take 1 tablet (150 mcg total) by mouth every evening.  90 tablet  3   lidocaine (LIDODERM) 5 % APPLY 1 PATCH FOR 12 HOURS OUT OF 24 HOURS  90 patch  0   losartan (COZAAR) 50 MG tablet TAKE 1 TABLET BY MOUTH DAILY  90 tablet  3   metaxalone  (SKELAXIN) 800 MG tablet Take 1 tablet (800 mg total) by mouth as needed.  90 tablet  3   metFORMIN (GLUCOPHAGE) 1000 MG tablet Take 500 mg by mouth 2 (two) times daily with a meal.       metoprolol tartrate (LOPRESSOR) 12.5 mg TABS Take 12.5 mg by mouth every morning.       omeprazole (PRILOSEC) 20 MG capsule TAKE 1 CAPSULE BY MOUTH DAILY  90 capsule  1   ondansetron (ZOFRAN-ODT) 4 MG disintegrating tablet DISSOLVE 1 TABLET BY MOUTH EVERY 8 HOURS AS NEEDED FOR NAUSEA.  30 tablet  1   oxyCODONE (ROXICODONE) 5 MG immediate release tablet Take 1/2-1 tablet as needed for severe pain  20 tablet  0   polyethylene glycol (MIRALAX / GLYCOLAX) packet Take 17 g by mouth as needed.       traZODone (DESYREL) 100 MG tablet Take 1-1/2-2 tablets at bedtime  180 tablet  3   triamcinolone (NASACORT) 55 MCG/ACT nasal inhaler Place 2 sprays into the nose as needed.  3 Inhaler  3   trimethoprim (TRIMPEX) 100 MG tablet Take 1 tablet (100 mg total) by mouth daily.  90 tablet  0   TRUETEST TEST test strip USE AS DIRECTED  400 each  PRN   VOLTAREN 1 % GEL APPLY 4 GRAMS 3-4 TIMES DAILY  1 Tube  5   zolpidem (AMBIEN) 5 MG tablet Take one half to one tablet at bedtime for sleep  90 tablet  1   metoprolol succinate (TOPROL-XL) 25 MG 24 hr tablet TAKE 1/2 TABLET BY MOUTH DAILY  90 tablet  3   mometasone (ASMANEX) 220 MCG/INH inhaler Inhale 2 puffs into the lungs daily.  3 Inhaler  3   mometasone (NASONEX) 50 MCG/ACT nasal spray Place 2 sprays into the nose daily.  17 g  3   Probiotic Product (PRO-BIOTIC BLEND) CAPS Take by mouth.       traMADol (ULTRAM) 50 MG tablet Take 1 tablet (50 mg total) by mouth every 8 (eight) hours as needed.  30 tablet  0   No current facility-administered medications for this visit.       Objective:     Filed Vitals:   02/17/14 1118  BP: 122/74  Pulse: 76  Temp: 98.2 F (36.8 C)  TempSrc: Oral  Resp: 16  Height: 5' 7.75" (1.721 m)  Weight: 177 lb 12.8 oz (80.65 kg)   SpO2: 99%    DIAGNOSTIC STUDIES: Oxygen Saturation is 99% on room air, normal by my interpretation.    COORDINATION OF CARE:  11:41 AM Discussed course of care with pt. I will give her a referral for a colonoscopy screening. I will give pt a referral to a neural surgeon. I will refill her Ativan prescription.  Pt understands and agrees.    Physical Exam  CONSTITUTIONAL: Well developed/well nourished HEAD: Normocephalic/atraumatic EYES: EOMI/PERRL ENMT: Mucous membranes moist NECK: supple no meningeal signs SPINE:Tenderness over the lower back CV: S1/S2 noted, no murmurs/rubs/gallops noted LUNGS: Lungs are clear  to auscultation bilaterally, no apparent distress ABDOMEN: soft, nontender, no rebound or guarding NEURO: Pt is awake/alert, moves all extremitiesx4 EXTREMITIES: pulses normal, full ROM. She has a R knee replacement. 2+ symmetrical ankle reflexes.No lower extremity weakness.Left shoulder: pain with internal and external rotation and pain with external adduction w/ resistance.  SKIN: warm, color normal , Surgical scar w/o palpable masses PSYCH: no abnormalities of mood noted Results for orders placed in visit on 02/17/14  POCT CBC      Result Value Ref Range   WBC 4.9  4.6 - 10.2 K/uL   Lymph, poc 1.8  0.6 - 3.4   POC LYMPH PERCENT 36.3  10 - 50 %L   MID (cbc) 0.5  0 - 0.9   POC MID % 9.2  0 - 12 %M   POC Granulocyte 2.7  2 - 6.9   Granulocyte percent 54.5  37 - 80 %G   RBC 5.00  4.04 - 5.48 M/uL   Hemoglobin 14.2  12.2 - 16.2 g/dL   HCT, POC 43.3  37.7 - 47.9 %   MCV 86.7  80 - 97 fL   MCH, POC 28.3  27 - 31.2 pg   MCHC 32.7  31.8 - 35.4 g/dL   RDW, POC 13.7     Platelet Count, POC 203  142 - 424 K/uL   MPV 7.5  0 - 99.8 fL  POCT GLYCOSYLATED HEMOGLOBIN (HGB A1C)      Result Value Ref Range   Hemoglobin A1C 6.4    GLUCOSE, POCT (MANUAL RESULT ENTRY)      Result Value Ref Range   POC Glucose 116 (*) 70 - 99 mg/dl      Assessment & Plan:  Hemoglobin A1c is  better on resumption of full dose Glucophage. She is not able to tolerate statins. Did have an STD exposure on her skin while in [place] so HIV testing and syphilis testing were done. Referral made physical therapy to work on her left shoulder. she is referred to Dr. Arnoldo Morale to help with her back disease. She's also referred to Dr. Gemma Payor for colonoscopy I personally performed the services described in this documentation, which was scribed in my presence. The recorded information has been reviewed and is accurate.

## 2014-02-18 LAB — LIPID PANEL
CHOLESTEROL: 225 mg/dL — AB (ref 0–200)
HDL: 57 mg/dL (ref >39)
LDL Cholesterol: 149 mg/dL — ABNORMAL HIGH (ref 0–99)
Total CHOL/HDL Ratio: 3.9 Ratio
Triglycerides: 97 mg/dL (ref <150)
VLDL: 19 mg/dL (ref 0–40)

## 2014-02-18 LAB — COMPREHENSIVE METABOLIC PANEL
ALT: 13 U/L (ref 0–35)
AST: 17 U/L (ref 0–37)
Albumin: 4.5 g/dL (ref 3.5–5.2)
Alkaline Phosphatase: 62 U/L (ref 39–117)
BUN: 13 mg/dL (ref 6–23)
CALCIUM: 9 mg/dL (ref 8.4–10.5)
CHLORIDE: 102 meq/L (ref 96–112)
CO2: 22 meq/L (ref 19–32)
Creat: 0.6 mg/dL (ref 0.50–1.10)
Glucose, Bld: 114 mg/dL — ABNORMAL HIGH (ref 70–99)
Potassium: 4 mEq/L (ref 3.5–5.3)
Sodium: 138 mEq/L (ref 135–145)
TOTAL PROTEIN: 6.9 g/dL (ref 6.0–8.3)
Total Bilirubin: 0.4 mg/dL (ref 0.2–1.2)

## 2014-02-18 LAB — RPR

## 2014-02-18 LAB — HIV ANTIBODY (ROUTINE TESTING W REFLEX): HIV 1&2 Ab, 4th Generation: NONREACTIVE

## 2014-02-19 ENCOUNTER — Encounter: Payer: Self-pay | Admitting: Radiology

## 2014-02-21 ENCOUNTER — Encounter: Payer: Self-pay | Admitting: Internal Medicine

## 2014-03-01 ENCOUNTER — Ambulatory Visit: Payer: 59 | Admitting: Physical Therapy

## 2014-03-06 ENCOUNTER — Ambulatory Visit: Payer: 59 | Attending: Emergency Medicine | Admitting: Physical Therapy

## 2014-03-06 DIAGNOSIS — M25519 Pain in unspecified shoulder: Secondary | ICD-10-CM | POA: Diagnosis not present

## 2014-03-08 ENCOUNTER — Ambulatory Visit: Payer: 59 | Admitting: Physical Therapy

## 2014-03-08 DIAGNOSIS — M25519 Pain in unspecified shoulder: Secondary | ICD-10-CM | POA: Diagnosis not present

## 2014-03-14 ENCOUNTER — Ambulatory Visit: Payer: 59 | Admitting: Physical Therapy

## 2014-03-14 DIAGNOSIS — M25519 Pain in unspecified shoulder: Secondary | ICD-10-CM | POA: Diagnosis not present

## 2014-03-16 ENCOUNTER — Ambulatory Visit: Payer: 59 | Admitting: Physical Therapy

## 2014-03-16 DIAGNOSIS — M25519 Pain in unspecified shoulder: Secondary | ICD-10-CM | POA: Diagnosis not present

## 2014-03-21 ENCOUNTER — Ambulatory Visit: Payer: 59 | Admitting: Physical Therapy

## 2014-03-21 DIAGNOSIS — M25519 Pain in unspecified shoulder: Secondary | ICD-10-CM | POA: Diagnosis not present

## 2014-03-22 ENCOUNTER — Other Ambulatory Visit: Payer: Self-pay | Admitting: Physician Assistant

## 2014-03-23 ENCOUNTER — Ambulatory Visit: Payer: 59 | Admitting: Physical Therapy

## 2014-03-23 ENCOUNTER — Other Ambulatory Visit: Payer: Self-pay | Admitting: Emergency Medicine

## 2014-03-27 ENCOUNTER — Other Ambulatory Visit: Payer: Self-pay | Admitting: Emergency Medicine

## 2014-03-27 ENCOUNTER — Other Ambulatory Visit: Payer: Self-pay | Admitting: *Deleted

## 2014-03-27 DIAGNOSIS — M62838 Other muscle spasm: Secondary | ICD-10-CM

## 2014-03-27 MED ORDER — METAXALONE 800 MG PO TABS: 800.0000 mg | ORAL_TABLET | ORAL | Status: DC | PRN

## 2014-03-29 ENCOUNTER — Other Ambulatory Visit: Payer: Self-pay | Admitting: Emergency Medicine

## 2014-03-30 ENCOUNTER — Other Ambulatory Visit: Payer: Self-pay | Admitting: Radiology

## 2014-03-30 NOTE — Telephone Encounter (Signed)
Faxed Ambien 

## 2014-04-18 ENCOUNTER — Ambulatory Visit (AMBULATORY_SURGERY_CENTER): Payer: Self-pay | Admitting: *Deleted

## 2014-04-18 VITALS — Ht 67.5 in | Wt 180.8 lb

## 2014-04-18 DIAGNOSIS — Z1211 Encounter for screening for malignant neoplasm of colon: Secondary | ICD-10-CM

## 2014-04-18 MED ORDER — MOVIPREP 100 G PO SOLR
ORAL | Status: DC
Start: 2014-04-18 — End: 2014-05-02

## 2014-04-18 NOTE — Progress Notes (Signed)
No allergies to eggs or soy. No problems with anesthesia.  Pt given Emmi instructions for colonoscopy  No oxygen use  No diet drug use  

## 2014-04-19 ENCOUNTER — Ambulatory Visit (INDEPENDENT_AMBULATORY_CARE_PROVIDER_SITE_OTHER): Payer: 59 | Admitting: *Deleted

## 2014-04-19 ENCOUNTER — Telehealth: Payer: Self-pay | Admitting: *Deleted

## 2014-04-19 DIAGNOSIS — Z23 Encounter for immunization: Secondary | ICD-10-CM

## 2014-04-19 NOTE — Telephone Encounter (Signed)
Pt scheduled for direct colonoscopy with Dr. Hilarie Fredrickson 05/02/2014.  Last colonoscopy 10 years ago with Dr Virgilio Frees in [place], [place].  Pt says it was normal.  Release of information form signed and given to [place].

## 2014-04-19 NOTE — Patient Instructions (Signed)

## 2014-04-19 NOTE — Telephone Encounter (Signed)
Release of info sent to Dr Georgiann Cocker.

## 2014-05-02 ENCOUNTER — Ambulatory Visit (AMBULATORY_SURGERY_CENTER): Payer: 59 | Admitting: Internal Medicine

## 2014-05-02 ENCOUNTER — Encounter: Payer: Self-pay | Admitting: Internal Medicine

## 2014-05-02 VITALS — BP 143/88 | HR 59 | Temp 98.3°F | Resp 16 | Ht 67.5 in | Wt 180.0 lb

## 2014-05-02 DIAGNOSIS — D123 Benign neoplasm of transverse colon: Secondary | ICD-10-CM

## 2014-05-02 DIAGNOSIS — D122 Benign neoplasm of ascending colon: Secondary | ICD-10-CM

## 2014-05-02 DIAGNOSIS — D125 Benign neoplasm of sigmoid colon: Secondary | ICD-10-CM

## 2014-05-02 DIAGNOSIS — Z1211 Encounter for screening for malignant neoplasm of colon: Secondary | ICD-10-CM

## 2014-05-02 DIAGNOSIS — D124 Benign neoplasm of descending colon: Secondary | ICD-10-CM

## 2014-05-02 LAB — GLUCOSE, CAPILLARY
GLUCOSE-CAPILLARY: 106 mg/dL — AB (ref 70–99)
Glucose-Capillary: 105 mg/dL — ABNORMAL HIGH (ref 70–99)

## 2014-05-02 MED ORDER — SODIUM CHLORIDE 0.9 % IV SOLN: 500.0000 mL | INTRAVENOUS | Status: DC

## 2014-05-02 NOTE — Op Note (Signed)
[place]  [address] [place], [address]   COLONOSCOPY PROCEDURE REPORT  PATIENT: [name], [name]  MR#: [id number] BIRTHDATE: [birth year] , 70  yrs. old GENDER: female ENDOSCOPIST: Jerene Bears, MD REFERRED OI:NOMVEH Everlene Farrier, M.D. PROCEDURE DATE:  05/02/2014 PROCEDURE:   Colonoscopy with snare polypectomy and Colonoscopy with cold biopsy polypectomy First Screening Colonoscopy - Avg.  risk and is 50 yrs.  old or older - No.  Prior Negative Screening - Now for repeat screening. 10 or more years since last screening  History of Adenoma - Now for follow-up colonoscopy & has been > or = to 3 yrs.  N/A  Polyps Removed Today? Yes. ASA CLASS:   Class III INDICATIONS:average risk for colorectal cancer and last colonoscopy completed  10 years ago. MEDICATIONS: Monitored anesthesia care and Propofol 350 mg IV  DESCRIPTION OF PROCEDURE:   After the risks benefits and alternatives of the procedure were thoroughly explained, informed consent was obtained.  The digital rectal exam revealed no rectal mass.   The [id number]  endoscope was introduced through the anus and advanced to the terminal ileum which was intubated for a short distance. No adverse events experienced.   The quality of the prep was good, using MoviPrep  The instrument was then slowly withdrawn as the colon was fully examined.  COLON FINDINGS: The examined terminal ileum appeared to be normal. A sessile polyp ranging from 2 to 39mm in size was found at the hepatic flexure, in the ascending colon  (2), and descending colon. Polypectomies were performed with a cold snare (1 ascending) and with cold forceps (3, ascending, hepatic flexure, and sigmoid). The resection was complete, the polyp tissue was completely retrieved and sent to histology.   There was moderate diverticulosis noted in the sigmoid colon and descending colon. Retroflexed views revealed internal hemorrhoids. The time to cecum=4  minutes 46 seconds.  Withdrawal time=18 minutes 14 seconds. The scope was withdrawn and the procedure completed.  COMPLICATIONS: There were no immediate complications.      ENDOSCOPIC IMPRESSION: 1.   The examined terminal ileum appeared to be normal 2.   Sessile polyp ranging from 2 to 62mm in size was found at the hepatic flexure, in the ascending colon, and descending colon; polypectomies were performed with a cold snare and with cold forceps 3.   Moderate diverticulosis was noted in the sigmoid colon and descending colon  RECOMMENDATIONS: 1.  Await pathology results 2.  High fiber diet 3.  Timing of repeat colonoscopy will be determined by pathology findings. 4.  You will receive a letter within 1-2 weeks with the results of your biopsy as well as final recommendations.  Please call my office if you have not received a letter after 3 weeks.  eSigned:  Jerene Bears, MD 05/02/2014 10:16 AM   cc: The Patient; [name], MD   PATIENT NAME:  Debbie Hayes, Debbie Hayes MR#: [id number]

## 2014-05-02 NOTE — Progress Notes (Signed)
Called to room to assist during endoscopic procedure.  Patient ID and intended procedure confirmed with present staff. Received instructions for my participation in the procedure from the performing physician.  

## 2014-05-02 NOTE — Patient Instructions (Signed)
YOU HAD AN ENDOSCOPIC PROCEDURE TODAY AT [facility]: Refer to the procedure report that was given to you for any specific questions about what was found during the examination.  If the procedure report does not answer your questions, please call your gastroenterologist to clarify.  If you requested that your care partner not be given the details of your procedure findings, then the procedure report has been included in a sealed envelope for you to review at your convenience later.  YOU SHOULD EXPECT: Some feelings of bloating in the abdomen. Passage of more gas than usual.  Walking can help get rid of the air that was put into your GI tract during the procedure and reduce the bloating. If you had a lower endoscopy (such as a colonoscopy or flexible sigmoidoscopy) you may notice spotting of blood in your stool or on the toilet paper. If you underwent a bowel prep for your procedure, then you may not have a normal bowel movement for a few days.  DIET: Your first meal following the procedure should be a light meal and then it is ok to progress to your normal diet.  A half-sandwich or bowl of soup is an example of a good first meal.  Heavy or fried foods are harder to digest and may make you feel nauseous or bloated.  Likewise meals heavy in dairy and vegetables can cause extra gas to form and this can also increase the bloating.  Drink plenty of fluids but you should avoid alcoholic beverages for 24 hours. Try to increase the fiber in your diet due to you Diverticulosis.  ACTIVITY: Your care partner should take you home directly after the procedure.  You should plan to take it easy, moving slowly for the rest of the day.  You can resume normal activity the day after the procedure however you should NOT DRIVE or use heavy machinery for 24 hours (because of the sedation medicines used during the test).    SYMPTOMS TO REPORT IMMEDIATELY: A gastroenterologist can be reached at any hour.  During  normal business hours, 8:30 AM to 5:00 PM Monday through Friday, call [phone].  After hours and on weekends, please call the GI answering service at [phone] who will take a message and have the physician on call contact you.   Following lower endoscopy (colonoscopy or flexible sigmoidoscopy):  Excessive amounts of blood in the stool  Significant tenderness or worsening of abdominal pains  Swelling of the abdomen that is new, acute  Fever of 100F or higher  FOLLOW UP: If any biopsies were taken you will be contacted by phone or by letter within the next 1-3 weeks.  Call your gastroenterologist if you have not heard about the biopsies in 3 weeks.  Our staff will call the home number listed on your records the next business day following your procedure to check on you and address any questions or concerns that you may have at that time regarding the information given to you following your procedure. This is a courtesy call and so if there is no answer at the home number and we have not heard from you through the emergency physician on call, we will assume that you have returned to your regular daily activities without incident.  SIGNATURES/CONFIDENTIALITY: You and/or your care partner have signed paperwork which will be entered into your electronic medical record.  These signatures attest to the fact that that the information above on your After Visit Summary has been  reviewed and is understood.  Full responsibility of the confidentiality of this discharge information lies with you and/or your care-partner.  Please, read all of the handouts given to you by your recovery room nurse.

## 2014-05-02 NOTE — Progress Notes (Signed)
Report to PACU, RN, vss, BBS= Clear.  

## 2014-05-03 ENCOUNTER — Telehealth: Payer: Self-pay | Admitting: *Deleted

## 2014-05-03 NOTE — Telephone Encounter (Signed)
  Follow up Call-  Call back number 05/02/2014  Post procedure Call Back phone  # [phone]  Permission to leave phone message Yes     Patient questions:  Do you have a fever, pain , or abdominal swelling? No. Pain Score  0 *  Have you tolerated food without any problems? Yes.    Have you been able to return to your normal activities? Yes.    Do you have any questions about your discharge instructions: Diet   No. Medications  No. Follow up visit  No.  Do you have questions or concerns about your Care? No.  Actions: * If pain score is 4 or above: No action needed, pain <4.

## 2014-05-08 ENCOUNTER — Encounter: Payer: Self-pay | Admitting: Internal Medicine

## 2014-05-09 ENCOUNTER — Ambulatory Visit: Payer: 59 | Attending: Emergency Medicine | Admitting: Physical Therapy

## 2014-05-09 DIAGNOSIS — I10 Essential (primary) hypertension: Secondary | ICD-10-CM | POA: Insufficient documentation

## 2014-05-09 DIAGNOSIS — M4806 Spinal stenosis, lumbar region: Secondary | ICD-10-CM | POA: Insufficient documentation

## 2014-05-09 DIAGNOSIS — E119 Type 2 diabetes mellitus without complications: Secondary | ICD-10-CM | POA: Diagnosis not present

## 2014-05-09 DIAGNOSIS — M545 Low back pain: Secondary | ICD-10-CM | POA: Insufficient documentation

## 2014-05-15 ENCOUNTER — Ambulatory Visit: Payer: 59 | Admitting: Physical Therapy

## 2014-05-15 DIAGNOSIS — M4806 Spinal stenosis, lumbar region: Secondary | ICD-10-CM | POA: Diagnosis not present

## 2014-05-18 ENCOUNTER — Ambulatory Visit: Payer: 59 | Admitting: Physical Therapy

## 2014-05-18 DIAGNOSIS — M4806 Spinal stenosis, lumbar region: Secondary | ICD-10-CM | POA: Diagnosis not present

## 2014-05-22 ENCOUNTER — Ambulatory Visit: Payer: 59 | Admitting: Physical Therapy

## 2014-05-22 DIAGNOSIS — M4806 Spinal stenosis, lumbar region: Secondary | ICD-10-CM | POA: Diagnosis not present

## 2014-05-25 ENCOUNTER — Ambulatory Visit: Payer: 59 | Admitting: Physical Therapy

## 2014-05-25 DIAGNOSIS — M4806 Spinal stenosis, lumbar region: Secondary | ICD-10-CM | POA: Diagnosis not present

## 2014-05-29 ENCOUNTER — Ambulatory Visit: Payer: 59 | Attending: Emergency Medicine | Admitting: Physical Therapy

## 2014-05-29 DIAGNOSIS — M4806 Spinal stenosis, lumbar region: Secondary | ICD-10-CM | POA: Insufficient documentation

## 2014-05-29 DIAGNOSIS — I10 Essential (primary) hypertension: Secondary | ICD-10-CM | POA: Diagnosis not present

## 2014-05-29 DIAGNOSIS — E119 Type 2 diabetes mellitus without complications: Secondary | ICD-10-CM | POA: Diagnosis not present

## 2014-05-29 DIAGNOSIS — M545 Low back pain: Secondary | ICD-10-CM | POA: Diagnosis not present

## 2014-06-05 ENCOUNTER — Encounter: Payer: Self-pay | Admitting: Emergency Medicine

## 2014-06-08 ENCOUNTER — Other Ambulatory Visit (HOSPITAL_COMMUNITY): Payer: Self-pay | Admitting: Neurosurgery

## 2014-06-08 DIAGNOSIS — M48061 Spinal stenosis, lumbar region without neurogenic claudication: Secondary | ICD-10-CM

## 2014-06-09 ENCOUNTER — Ambulatory Visit (HOSPITAL_COMMUNITY): Admission: RE | Admit: 2014-06-09 | Payer: 59 | Source: Ambulatory Visit

## 2014-06-20 ENCOUNTER — Encounter (HOSPITAL_COMMUNITY): Payer: Self-pay | Admitting: Radiology

## 2014-06-20 ENCOUNTER — Ambulatory Visit (HOSPITAL_COMMUNITY)
Admission: RE | Admit: 2014-06-20 | Discharge: 2014-06-20 | Disposition: A | Discharge status: Home / Self Care | Payer: 59 | Source: Ambulatory Visit | Attending: Neurosurgery | Admitting: Neurosurgery

## 2014-06-20 DIAGNOSIS — M4806 Spinal stenosis, lumbar region: Secondary | ICD-10-CM | POA: Diagnosis present

## 2014-06-20 DIAGNOSIS — M48061 Spinal stenosis, lumbar region without neurogenic claudication: Secondary | ICD-10-CM

## 2014-06-24 ENCOUNTER — Ambulatory Visit (INDEPENDENT_AMBULATORY_CARE_PROVIDER_SITE_OTHER): Payer: 59 | Admitting: Emergency Medicine

## 2014-06-24 VITALS — BP 122/68 | HR 76 | Temp 98.6°F | Resp 18 | Ht 67.5 in | Wt 182.6 lb

## 2014-06-24 DIAGNOSIS — E119 Type 2 diabetes mellitus without complications: Secondary | ICD-10-CM

## 2014-06-24 LAB — POCT GLYCOSYLATED HEMOGLOBIN (HGB A1C): Hemoglobin A1C: 6.4

## 2014-06-24 MED ORDER — OXYCODONE HCL 5 MG PO TABS: ORAL_TABLET | ORAL | Status: DC

## 2014-06-24 NOTE — Progress Notes (Signed)
Subjective:  This chart was scribed for Debbie Jordan, MD by Dellis Filbert, ED Scribe at [facility].The patient was seen in exam room 14 and the patient's care was started at 12:09 PM.   Patient ID: Debbie Hayes, female    DOB: 26-Apr-1951, 63 y.o.   MRN: [id number]  HPI HPI Comments: Debbie Hayes is a 63 y.o. female with a history of back pain and DM who presents to [facility] for a follow up.  She says she is doing fine overall but has asked for oxycodone refill because of back pain. She was referred for physical therapy and states it has been very helpful. Pt also notes that she had an MRI done two days ago.  She has an intermittent sprained foot which first appeared 6 weeks ago and she says she has had trouble walking on the trails. Pt believes it maybe because of some nerve damage.  She wants to be referred for PT for tendonitis in her right shoulder.  Pts has DM and says her sugar levels are fine she states it is typically between 90-110. She says it hardly ever goes over 120.   Patient Active Problem List   Diagnosis Date Noted  . Back pain 02/17/2014  . Macular degeneration 02/17/2014  . Other and unspecified hyperlipidemia 10/14/2013  . Onychomycosis 12/01/2012  . Diabetes mellitus 04/12/2012  . Arthritis 04/12/2012  . Hypothyroid 04/12/2012   Past Medical History  Diagnosis Date  . Rotator cuff disorder LEFT SHOULDER RTC IMPINGMENT  . Hyperlipidemia   . Normal nuclear stress test 01-25-2009  . Disorder of inner ear CAUSES VERTIGO OCCASIONALLY  . Chronic migraine BOTOX INJECTION EVERY 3 MONTHS  . Fibromyalgia   . Hemorrhoid   . Constipation   . Spondylolisthesis of lumbar region   . GERD (gastroesophageal reflux disease)   . Hypothyroidism   . History of thyroid cancer 2009  S/P TOTAL THYROIDECTOMY AND RADIATION  . Diabetes mellitus   . 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 Insomnia   . PONV (postoperative nausea and vomiting) SEVERE  .  Chronic pain in right shoulder   . Left shoulder pain   . OA (osteoarthritis) JOINTS  . Hypertension CARDIOLOGIST- DR Wynonia Lawman- WILL REQUEST LATE NOTE    DENIES S & S  . Allergy   . Cataract    Past Surgical History  Procedure Laterality Date  . Right knee closed manipulation  09-11-2010  . Total knee arthroplasty  07-16-2010    RIGHT  . Right knee arthroscopy/ partial lateral menisectomy/ tricompartment chondroplasty/ decompression cyst  10-26-2009  . Right knee arthroscopy  X2  BEFORE 2011  . Total thyroidectomy  2009    CANCER  (POST-OP BLEED)  AND RADIATION TX  . Left thumb joint replacement  2005    RIGHT DONE IN 2004  . Right shoulder arthroscopy  2010  &  2004  . Bilateral elbow surg.  1999  . Bilateral carpal tunnel release  1994  . Vaginal hysterectomy  1990  . Bilateral salpingoophectomy  1993    POST-OP URETER REPAIR 12 DAYS AFTER   . Tonsillectomy  1968  . Tibia cyst removed and orif leg fx  1958  . Trigger fingers Right 2013    3rd and 4th fingers   Allergies  Allergen Reactions  . Morphine And Related Nausea And Vomiting and Other (See Comments)    SEVERE N/V AND MIGRAINES  . Sulfa Antibiotics Hives   Prior to Admission  medications   Medication Sig Start Date End Date Taking? Authorizing Provider  acetaminophen (TYLENOL) 650 MG CR tablet Take 650 mg by mouth every 8 (eight) hours as needed.   Yes Historical Provider, MD  aspirin 81 MG tablet Take 81 mg by mouth daily.   Yes Historical Provider, MD  azelastine (ASTELIN) 137 MCG/SPRAY nasal spray Place 2 sprays into both nostrils 2 (two) times daily. Use in each nostril as directed 10/14/13  Yes [name], MD  cetirizine (ZYRTEC) 10 MG tablet Take 10 mg by mouth daily.   Yes Historical Provider, MD  FLECTOR 1.3 % PTCH PLACE 1 PATCH ONTO THE SKIN 2 TIMES DAILY. 05/05/13  Yes Eleanore E Elana Alm, PA-C  ibuprofen (ADVIL,MOTRIN) 200 MG tablet Take 200 mg by mouth every 6 (six) hours as needed.   Yes Historical Provider,  MD  Lancets MISC True test lancets to check blood sugar bid dx code 250.00 04/21/13  Yes Eleanore E Egan, PA-C  levothyroxine (SYNTHROID, LEVOTHROID) 150 MCG tablet Take 1 tablet (150 mcg total) by mouth every evening. Patient taking differently: Take 137 mcg by mouth every evening. 137 mcg 10/14/13  Yes [name], MD  lidocaine (LIDODERM) 5 % APPLY 1 PATCH FOR 12 HOURS OUT OF 24 HOURS 01/17/14  Yes Mancel Bale, PA-C  losartan (COZAAR) 50 MG tablet TAKE 1 TABLET BY MOUTH DAILY 01/10/13  Yes Heather M Marte, PA-C  metaxalone (SKELAXIN) 800 MG tablet Take 1 tablet (800 mg total) by mouth as needed. 03/27/14  Yes [name], MD  metFORMIN (GLUCOPHAGE) 1000 MG tablet TAKE 1 TABLET BY MOUTH 2 (TWO) TIMES DAILY WITH A MEAL. 03/30/14  Yes [name], MD  metoprolol tartrate (LOPRESSOR) 12.5 mg TABS Take 12.5 mg by mouth every morning.   Yes Historical Provider, MD  mometasone [facility]) 220 MCG/INH inhaler Inhale 2 puffs into the lungs daily. 10/14/13  Yes [name], MD  mometasone (NASONEX) 50 MCG/ACT nasal spray Place 2 sprays into the nose daily. 10/14/13  Yes [name], MD  ondansetron (ZOFRAN-ODT) 4 MG disintegrating tablet DISSOLVE 1 TABLET BY MOUTH EVERY 8 HOURS AS NEEDED FOR NAUSEA. 08/04/13  Yes [name], MD  oxyCODONE (ROXICODONE) 5 MG immediate release tablet Take 1/2-1 tablet as needed for severe pain 06/24/14  Yes [name], MD  polyethylene glycol (MIRALAX / GLYCOLAX) packet Take 17 g by mouth as needed.   Yes Historical Provider, MD  Probiotic Product (PRO-BIOTIC BLEND) CAPS Take by mouth.   Yes Historical Provider, MD  traZODone (DESYREL) 100 MG tablet Take 1-1/2-2 tablets at bedtime 01/13/14  Yes [name], MD  trimethoprim (TRIMPEX) 100 MG tablet Take 1 tablet (100 mg total) by mouth daily. 09/24/13  Yes Thao P Le, DO  TRUETEST TEST test strip USE AS DIRECTED 04/21/13  Yes Eleanore E Egan, PA-C  zolpidem (AMBIEN) 5 MG tablet TAKE 1 TABLET BY MOUTH AT BEDTIME AS NEEDED  FOR SLEEP 03/30/14  Yes [name], MD  ciclopirox (PENLAC) 8 % solution APPLY TOPICALLY OVER NAIL AT BEDTIME. AFTER 7 DAYS MAY REMOVE WITH ALCOHOL AND REPEAT. Patient not taking: Reported on 06/24/2014    [name], MD  Multiple Vitamins-Minerals (PRESERVISION AREDS 2 PO) Take by mouth 2 (two) times daily.    Historical Provider, MD  triamcinolone (NASACORT) 55 MCG/ACT nasal inhaler Place 2 sprays into the nose as needed. Patient not taking: Reported on 06/24/2014 01/02/12   [name], MD  History   Social History  . Marital Status: Married    Spouse Name: N/A    Number of Children: N/A  . Years of Education: N/A   Occupational History  . Not on file.   Social History Main Topics  . Smoking status: Never Smoker   . Smokeless tobacco: Never Used  . Alcohol Use: Yes     Comment: RARE  . Drug Use: No  . Sexual Activity: Not on file   Other Topics Concern  . Not on file   Social History Narrative   Review of Systems  Musculoskeletal: Positive for back pain and gait problem.      Objective:  BP 122/68 mmHg  Pulse 76  Temp(Src) 98.6 F (37 C) (Oral)  Resp 18  Ht 5' 7.5" (1.715 m)  Wt 182 lb 9.6 oz (82.827 kg)  BMI 28.16 kg/m2  SpO2 97%  Physical Exam  Constitutional: She is oriented to person, place, and time. She appears well-developed and well-nourished. No distress.  HENT:  Head: Normocephalic and atraumatic.  Right Ear: External ear normal.  Left Ear: External ear normal.  Eyes: Conjunctivae and EOM are normal. Pupils are equal, round, and reactive to light.  Neck: Normal range of motion. Neck supple.  Cardiovascular: Normal rate, regular rhythm and normal heart sounds.   Pulmonary/Chest: Effort normal and breath sounds normal.  Abdominal: Soft. Bowel sounds are normal.  Musculoskeletal:  Mild discomfort around the right bicep and shoulder area.  Neurological: She is alert and oriented to person, place, and time.  Skin: Skin is warm and dry.    Psychiatric: She has a normal mood and affect. Her behavior is normal.  Nursing note and vitals reviewed.  filament test is normal    Results for orders placed or performed in visit on 06/24/14  POCT glycosylated hemoglobin (Hb A1C)  Result Value Ref Range   Hemoglobin A1C 6.4    Assessment & Plan:  Patient has great diabetes control. She was given a limited number of oxycodone to have for her back pain. She has had an MRI done and is due to follow-up with Dr. Arnoldo Morale on Monday. She is also having some trouble with her right shoulder and upper arm and if she calls go ahead and request physical therapy to work on this.I personally performed the services described in this documentation, which was scribed in my presence. The recorded information has been reviewed and is accurate.

## 2014-06-28 ENCOUNTER — Other Ambulatory Visit: Payer: Self-pay

## 2014-06-28 ENCOUNTER — Encounter: Payer: Self-pay | Admitting: Physician Assistant

## 2014-06-28 MED ORDER — DICLOFENAC EPOLAMINE 1.3 % TD PTCH: MEDICATED_PATCH | TRANSDERMAL | Status: DC

## 2014-06-28 MED ORDER — PHENAZOPYRIDINE HCL 200 MG PO TABS: 200.0000 mg | ORAL_TABLET | Freq: Three times a day (TID) | ORAL | Status: DC | PRN

## 2014-06-28 MED ORDER — LOSARTAN POTASSIUM 50 MG PO TABS: 50.0000 mg | ORAL_TABLET | Freq: Every day | ORAL | Status: DC

## 2014-06-28 MED ORDER — TRUETEST TEST VI STRP: ORAL_STRIP | Status: DC

## 2014-06-28 NOTE — Progress Notes (Signed)
Patient needs a refill of pyridium for chronic/recurrent dysuria. Discussed with Dr. Everlene Farrier, the patient's PCP. Patient's husband notified.  Meds ordered this encounter  Medications  . phenazopyridine (PYRIDIUM) 200 MG tablet    Sig: Take 1 tablet (200 mg total) by mouth 3 (three) times daily as needed for pain.    Dispense:  40 tablet    Refill:  1    Order Specific Question:  Supervising Provider    Answer:  DOOLITTLE, ROBERT P [[id number]]

## 2014-07-12 ENCOUNTER — Other Ambulatory Visit (HOSPITAL_COMMUNITY): Payer: Self-pay | Admitting: Neurosurgery

## 2014-07-13 ENCOUNTER — Other Ambulatory Visit: Payer: Self-pay | Admitting: Emergency Medicine

## 2014-07-13 ENCOUNTER — Other Ambulatory Visit: Payer: Self-pay | Admitting: Radiology

## 2014-07-13 MED ORDER — NITROFURANTOIN MONOHYD MACRO 100 MG PO CAPS: 100.0000 mg | ORAL_CAPSULE | Freq: Two times a day (BID) | ORAL | Status: DC

## 2014-08-01 ENCOUNTER — Encounter (HOSPITAL_COMMUNITY)
Admission: RE | Admit: 2014-08-01 | Discharge: 2014-08-01 | Disposition: A | Discharge status: Home / Self Care | Payer: 59 | Source: Ambulatory Visit | Attending: Neurosurgery | Admitting: Neurosurgery

## 2014-08-01 ENCOUNTER — Encounter (HOSPITAL_COMMUNITY): Payer: Self-pay

## 2014-08-01 DIAGNOSIS — I517 Cardiomegaly: Secondary | ICD-10-CM | POA: Insufficient documentation

## 2014-08-01 DIAGNOSIS — I4519 Other right bundle-branch block: Secondary | ICD-10-CM | POA: Diagnosis not present

## 2014-08-01 DIAGNOSIS — Z01818 Encounter for other preprocedural examination: Secondary | ICD-10-CM | POA: Insufficient documentation

## 2014-08-01 HISTORY — DX: Personal history of urinary (tract) infections: Z87.440

## 2014-08-01 HISTORY — DX: Unspecified macular degeneration: H35.30

## 2014-08-01 HISTORY — DX: Presence of spectacles and contact lenses: Z97.3

## 2014-08-01 LAB — ABO/RH: ABO/RH(D): O POS

## 2014-08-01 LAB — BASIC METABOLIC PANEL
Anion gap: 8 (ref 5–15)
BUN: 14 mg/dL (ref 6–23)
CALCIUM: 9.4 mg/dL (ref 8.4–10.5)
CO2: 29 mmol/L (ref 19–32)
Chloride: 100 mEq/L (ref 96–112)
Creatinine, Ser: 0.59 mg/dL (ref 0.50–1.10)
GFR calc Af Amer: >90 mL/min (ref >90)
GFR calc non Af Amer: >90 mL/min (ref >90)
Glucose, Bld: 126 mg/dL — ABNORMAL HIGH (ref 70–99)
Potassium: 4 mmol/L (ref 3.5–5.1)
Sodium: 137 mmol/L (ref 135–145)

## 2014-08-01 LAB — TYPE AND SCREEN
ABO/RH(D): O POS
Antibody Screen: NEGATIVE

## 2014-08-01 LAB — SURGICAL PCR SCREEN
MRSA, PCR: NEGATIVE
STAPHYLOCOCCUS AUREUS: NEGATIVE

## 2014-08-01 LAB — CBC
HCT: 43.2 % (ref 36.0–46.0)
HEMOGLOBIN: 14.2 g/dL (ref 12.0–15.0)
MCH: 28.8 pg (ref 26.0–34.0)
MCHC: 32.9 g/dL (ref 30.0–36.0)
MCV: 87.6 fL (ref 78.0–100.0)
Platelets: 199 10*3/uL (ref 150–400)
RBC: 4.93 MIL/uL (ref 3.87–5.11)
RDW: 13.6 % (ref 11.5–15.5)
WBC: 5.3 10*3/uL (ref 4.0–10.5)

## 2014-08-01 NOTE — Progress Notes (Signed)
Pt denies SOB and chest pain. Pt stated that she is under the care of Dr. Wynonia Lawman, cardiology. Pt denies having a chest x ary and EKG within the last year. Pt denies having an echo and cardiac cath but stated that she had a stress test in 2010 that was okay. Pt PCP is Dr[facility] [name]

## 2014-08-01 NOTE — Pre-Procedure Instructions (Signed)
DELPHIA KAYLOR  08/01/2014   Your procedure is scheduled on:  Wednesday, August 09, 2014  Report to [facility] Admitting at 9:45 AM.( per MD)  Call this number if you have problems the morning of surgery: [phone]   Remember:   Do not eat food or drink liquids after midnight Tuesday, August 08, 2014   Take these medicines the morning of surgery with A SIP OF WATER:levothyroxine (SYNTHROID,   metoprolol succinate (TOPROL), mometasone (ASMANEX) 220 MCG/INH inhaler,  mometasone (NASONEX)  nasal spray,  azelastine (ASTELIN)  nasal spray  if needed: pain medication, ondansetron (ZOFRAN-ODT) for nausea, omeprazole (PRILOSEC) for ACID REFLUX, cetirizine (ZYRTEC) for allergies  DO NOT TAKE ANY DIABETIC MEDICATION THE MORNING OF PROCEDURE such as metFORMIN (GLUCOPHAGE)  Stop taking Aspirin, vitamins, and herbal medications ( Pro- Biotic). Do not take any NSAIDs ie: Ibuprofen   (Motrin), Advil, Naproxen ( Aleve) or any medication containing Aspirin such as diclofenac (FLECTOR) patch  Do not wear jewelry, make-up or nail polish.  Do not wear lotions, powders, or perfumes. You may not wear deodorant.  Do not shave 48 hours prior to surgery.   Do not bring valuables to the hospital.  [facility] is not responsible for any belongings or valuables.               Contacts, dentures or bridgework may not be worn into surgery.  Leave suitcase in the car. After surgery it may be brought to your room.  For patients admitted to the hospital, discharge time is determined by your treatment team.               Patients discharged the day of surgery will not be allowed to drive home.  Name and phone number of your driver:   Special Instructions:  Special Instructions:Special Instructions: [facility] - Preparing for Surgery  Before surgery, you can play an important role.  Because skin is not sterile, your skin needs to be as free of germs as possible.  You can reduce the number of germs on  you skin by washing with CHG (chlorahexidine gluconate) soap before surgery.  CHG is an antiseptic cleaner which kills germs and bonds with the skin to continue killing germs even after washing.  Please DO NOT use if you have an allergy to CHG or antibacterial soaps.  If your skin becomes reddened/irritated stop using the CHG and inform your nurse when you arrive at Short Stay.  Do not shave (including legs and underarms) for at least 48 hours prior to the first CHG shower.  You may shave your face.  Please follow these instructions carefully:   1.  Shower with CHG Soap the night before surgery and the morning of Surgery.  2.  If you choose to wash your hair, wash your hair first as usual with your normal shampoo.  3.  After you shampoo, rinse your hair and body thoroughly to remove the Shampoo.  4.  Use CHG as you would any other liquid soap.  You can apply chg directly  to the skin and wash gently with scrungie or a clean washcloth.  5.  Apply the CHG Soap to your body ONLY FROM THE NECK DOWN.  Do not use on open wounds or open sores.  Avoid contact with your eyes, ears, mouth and genitals (private parts).  Wash genitals (private parts) with your normal soap.  6.  Wash thoroughly, paying special attention to the area where your surgery will be  performed.  7.  Thoroughly rinse your body with warm water from the neck down.  8.  DO NOT shower/wash with your normal soap after using and rinsing off the CHG Soap.  9.  Pat yourself dry with a clean towel.            10.  Wear clean pajamas.            11.  Place clean sheets on your bed the night of your first shower and do not sleep with pets.  Day of Surgery  Do not apply any lotions/deodorants the morning of surgery.  Please wear clean clothes to the hospital/surgery center.   Please read over the following fact sheets that you were given: Pain Booklet, Coughing and Deep Breathing, Blood Transfusion Information, MRSA Information and Surgical  Site Infection Prevention

## 2014-08-04 ENCOUNTER — Other Ambulatory Visit: Payer: Self-pay

## 2014-08-04 ENCOUNTER — Other Ambulatory Visit: Payer: Self-pay | Admitting: *Deleted

## 2014-08-04 MED ORDER — TRIMETHOPRIM 100 MG PO TABS: 100.0000 mg | ORAL_TABLET | Freq: Every day | ORAL | Status: DC

## 2014-08-04 NOTE — Telephone Encounter (Signed)
Pharm req'd RFs of omeprazole and valcyclovir. Dr Everlene Farrier, you saw pt in Nov, but not for these meds. I don't see where we have Rxd the valtrex in EPIC. Do you want to give RFs or RTC?

## 2014-08-04 NOTE — Telephone Encounter (Signed)
Letter received from patient husband needing a refill. Dr. Everlene Farrier gave vo to send in #[phone]

## 2014-08-05 MED ORDER — VALACYCLOVIR HCL 1 G PO TABS: ORAL_TABLET | ORAL | Status: DC

## 2014-08-05 MED ORDER — OMEPRAZOLE 20 MG PO CPDR: 20.0000 mg | DELAYED_RELEASE_CAPSULE | Freq: Every day | ORAL | Status: DC | PRN

## 2014-08-08 MED ORDER — CEFAZOLIN SODIUM-DEXTROSE 2-3 GM-% IV SOLR
2.0000 g | INTRAVENOUS | Status: AC
  Administered 2014-08-09 (×2): 2 g via INTRAVENOUS
  Filled 2014-08-08: qty 50

## 2014-08-09 ENCOUNTER — Inpatient Hospital Stay (HOSPITAL_COMMUNITY): Payer: 59 | Admitting: Anesthesiology

## 2014-08-09 ENCOUNTER — Inpatient Hospital Stay (HOSPITAL_COMMUNITY)
Admission: RE | Admit: 2014-08-09 | Discharge: 2014-08-10 | DRG: 460 | Disposition: A | Discharge status: Home / Self Care | Payer: 59 | Source: Ambulatory Visit | Attending: Neurosurgery | Admitting: Neurosurgery

## 2014-08-09 ENCOUNTER — Inpatient Hospital Stay (HOSPITAL_COMMUNITY): Payer: 59

## 2014-08-09 ENCOUNTER — Encounter (HOSPITAL_COMMUNITY): Payer: Self-pay | Admitting: *Deleted

## 2014-08-09 ENCOUNTER — Encounter (HOSPITAL_COMMUNITY)
Admission: RE | Disposition: A | Discharge status: Home / Self Care | Payer: 59 | Source: Ambulatory Visit | Attending: Neurosurgery

## 2014-08-09 DIAGNOSIS — M4316 Spondylolisthesis, lumbar region: Secondary | ICD-10-CM | POA: Diagnosis present

## 2014-08-09 DIAGNOSIS — M51369 Other intervertebral disc degeneration, lumbar region without mention of lumbar back pain or lower extremity pain: Secondary | ICD-10-CM | POA: Insufficient documentation

## 2014-08-09 DIAGNOSIS — M4310 Spondylolisthesis, site unspecified: Secondary | ICD-10-CM

## 2014-08-09 DIAGNOSIS — M5116 Intervertebral disc disorders with radiculopathy, lumbar region: Principal | ICD-10-CM | POA: Diagnosis present

## 2014-08-09 DIAGNOSIS — Z419 Encounter for procedure for purposes other than remedying health state, unspecified: Secondary | ICD-10-CM

## 2014-08-09 LAB — GLUCOSE, CAPILLARY
Glucose-Capillary: 144 mg/dL — ABNORMAL HIGH (ref 70–99)
Glucose-Capillary: 150 mg/dL — ABNORMAL HIGH (ref 70–99)
Glucose-Capillary: 140 mg/dL — ABNORMAL HIGH (ref 70–99)

## 2014-08-09 SURGERY — POSTERIOR LUMBAR FUSION 1 LEVEL
Anesthesia: General

## 2014-08-09 MED ORDER — INSULIN ASPART 100 UNIT/ML ~~LOC~~ SOLN: 0.0000 [IU] | Freq: Every day | SUBCUTANEOUS | Status: DC

## 2014-08-09 MED ORDER — FENTANYL CITRATE 0.05 MG/ML IJ SOLN
INTRAMUSCULAR | Status: AC
  Filled 2014-08-09: qty 5

## 2014-08-09 MED ORDER — DEXAMETHASONE SODIUM PHOSPHATE 10 MG/ML IJ SOLN
INTRAMUSCULAR | Status: AC
  Filled 2014-08-09: qty 1

## 2014-08-09 MED ORDER — PANTOPRAZOLE SODIUM 40 MG PO TBEC: 40.0000 mg | DELAYED_RELEASE_TABLET | Freq: Every day | ORAL | Status: DC

## 2014-08-09 MED ORDER — VALACYCLOVIR HCL 500 MG PO TABS
500.0000 mg | ORAL_TABLET | Freq: Two times a day (BID) | ORAL | Status: DC | PRN
  Filled 2014-08-09: qty 1

## 2014-08-09 MED ORDER — DEXAMETHASONE SODIUM PHOSPHATE 10 MG/ML IJ SOLN
INTRAMUSCULAR | Status: DC | PRN
  Administered 2014-08-09: 10 mg via INTRAVENOUS

## 2014-08-09 MED ORDER — ACETAMINOPHEN 325 MG PO TABS: 650.0000 mg | ORAL_TABLET | ORAL | Status: DC | PRN

## 2014-08-09 MED ORDER — LACTATED RINGERS IV SOLN
INTRAVENOUS | Status: DC
  Administered 2014-08-09: 11:00:00 via INTRAVENOUS

## 2014-08-09 MED ORDER — LEVOTHYROXINE SODIUM 137 MCG PO TABS
137.0000 ug | ORAL_TABLET | Freq: Every day | ORAL | Status: DC
  Administered 2014-08-10: 137 ug via ORAL
  Filled 2014-08-09 (×2): qty 1

## 2014-08-09 MED ORDER — MEPERIDINE HCL 25 MG/ML IJ SOLN: 6.2500 mg | INTRAMUSCULAR | Status: DC | PRN

## 2014-08-09 MED ORDER — ROCURONIUM BROMIDE 50 MG/5ML IV SOLN
INTRAVENOUS | Status: AC
  Filled 2014-08-09: qty 1

## 2014-08-09 MED ORDER — 0.9 % SODIUM CHLORIDE (POUR BTL) OPTIME
TOPICAL | Status: DC | PRN
  Administered 2014-08-09: 1000 mL

## 2014-08-09 MED ORDER — SODIUM CHLORIDE 0.9 % IR SOLN
Status: DC | PRN
  Administered 2014-08-09: 13:00:00

## 2014-08-09 MED ORDER — MIDAZOLAM HCL 5 MG/5ML IJ SOLN
INTRAMUSCULAR | Status: DC | PRN
  Administered 2014-08-09: 2 mg via INTRAVENOUS

## 2014-08-09 MED ORDER — LACTATED RINGERS IV SOLN: INTRAVENOUS | Status: DC

## 2014-08-09 MED ORDER — TRAZODONE HCL 100 MG PO TABS
100.0000 mg | ORAL_TABLET | Freq: Every day | ORAL | Status: DC
  Administered 2014-08-09: 100 mg via ORAL
  Filled 2014-08-09 (×2): qty 1

## 2014-08-09 MED ORDER — HYDROCODONE-ACETAMINOPHEN 5-325 MG PO TABS: 1.0000 | ORAL_TABLET | ORAL | Status: DC | PRN

## 2014-08-09 MED ORDER — METFORMIN HCL 500 MG PO TABS
500.0000 mg | ORAL_TABLET | Freq: Every day | ORAL | Status: DC
  Administered 2014-08-10: 500 mg via ORAL
  Filled 2014-08-09 (×2): qty 1

## 2014-08-09 MED ORDER — MIDAZOLAM HCL 2 MG/2ML IJ SOLN: 0.5000 mg | Freq: Once | INTRAMUSCULAR | Status: DC | PRN

## 2014-08-09 MED ORDER — SCOPOLAMINE 1 MG/3DAYS TD PT72
MEDICATED_PATCH | TRANSDERMAL | Status: AC
  Filled 2014-08-09: qty 1

## 2014-08-09 MED ORDER — ZOLPIDEM TARTRATE 5 MG PO TABS
2.5000 mg | ORAL_TABLET | Freq: Every evening | ORAL | Status: DC | PRN
Start: 2014-08-09 — End: 2014-08-10

## 2014-08-09 MED ORDER — DOCUSATE SODIUM 100 MG PO CAPS
100.0000 mg | ORAL_CAPSULE | Freq: Two times a day (BID) | ORAL | Status: DC
  Administered 2014-08-09: 100 mg via ORAL
  Filled 2014-08-09: qty 1

## 2014-08-09 MED ORDER — CEFAZOLIN SODIUM-DEXTROSE 2-3 GM-% IV SOLR
2.0000 g | Freq: Three times a day (TID) | INTRAVENOUS | Status: AC
  Administered 2014-08-09: 2 g via INTRAVENOUS
  Filled 2014-08-09 (×2): qty 50

## 2014-08-09 MED ORDER — GLYCOPYRROLATE 0.2 MG/ML IJ SOLN
INTRAMUSCULAR | Status: DC | PRN
  Administered 2014-08-09: 0.4 mg via INTRAVENOUS

## 2014-08-09 MED ORDER — LIDOCAINE HCL (CARDIAC) 20 MG/ML IV SOLN
INTRAVENOUS | Status: DC | PRN
  Administered 2014-08-09: 40 mg via INTRAVENOUS

## 2014-08-09 MED ORDER — ONDANSETRON 8 MG PO TBDP
8.0000 mg | ORAL_TABLET | Freq: Three times a day (TID) | ORAL | Status: DC | PRN
  Filled 2014-08-09: qty 1

## 2014-08-09 MED ORDER — LOSARTAN POTASSIUM 25 MG PO TABS
25.0000 mg | ORAL_TABLET | Freq: Every day | ORAL | Status: DC
  Administered 2014-08-09: 25 mg via ORAL
  Filled 2014-08-09 (×2): qty 1

## 2014-08-09 MED ORDER — DIPHENHYDRAMINE HCL 50 MG/ML IJ SOLN
10.0000 mg | Freq: Once | INTRAMUSCULAR | Status: AC
  Administered 2014-08-09: 12.5 mg via INTRAVENOUS

## 2014-08-09 MED ORDER — ONDANSETRON HCL 4 MG/2ML IJ SOLN
4.0000 mg | INTRAMUSCULAR | Status: DC | PRN
  Administered 2014-08-09: 4 mg via INTRAVENOUS

## 2014-08-09 MED ORDER — PROPOFOL 10 MG/ML IV BOLUS
INTRAVENOUS | Status: DC | PRN
  Administered 2014-08-09: 150 mg via INTRAVENOUS

## 2014-08-09 MED ORDER — BUPIVACAINE-EPINEPHRINE (PF) 0.5% -1:200000 IJ SOLN
INTRAMUSCULAR | Status: DC | PRN
  Administered 2014-08-09: 10 mL via PERINEURAL

## 2014-08-09 MED ORDER — DIAZEPAM 5 MG PO TABS
ORAL_TABLET | ORAL | Status: AC
  Filled 2014-08-09: qty 1

## 2014-08-09 MED ORDER — ROCURONIUM BROMIDE 100 MG/10ML IV SOLN
INTRAVENOUS | Status: DC | PRN
  Administered 2014-08-09: 50 mg via INTRAVENOUS

## 2014-08-09 MED ORDER — OXYCODONE-ACETAMINOPHEN 5-325 MG PO TABS
ORAL_TABLET | ORAL | Status: AC
  Filled 2014-08-09: qty 2

## 2014-08-09 MED ORDER — FENTANYL CITRATE 0.05 MG/ML IJ SOLN
INTRAMUSCULAR | Status: DC | PRN
  Administered 2014-08-09: 100 ug via INTRAVENOUS
  Administered 2014-08-09 (×3): 50 ug via INTRAVENOUS
  Administered 2014-08-09: 100 ug via INTRAVENOUS
  Administered 2014-08-09 (×2): 50 ug via INTRAVENOUS
  Administered 2014-08-09: 100 ug via INTRAVENOUS
  Administered 2014-08-09 (×3): 50 ug via INTRAVENOUS

## 2014-08-09 MED ORDER — INSULIN ASPART 100 UNIT/ML ~~LOC~~ SOLN: 0.0000 [IU] | Freq: Three times a day (TID) | SUBCUTANEOUS | Status: DC

## 2014-08-09 MED ORDER — OXYCODONE HCL 5 MG PO TABS: 10.0000 mg | ORAL_TABLET | ORAL | Status: DC | PRN

## 2014-08-09 MED ORDER — PHENYLEPHRINE HCL 10 MG/ML IJ SOLN
10.0000 mg | INTRAMUSCULAR | Status: DC | PRN
  Administered 2014-08-09: 25 ug/min via INTRAVENOUS

## 2014-08-09 MED ORDER — MIDAZOLAM HCL 2 MG/2ML IJ SOLN
INTRAMUSCULAR | Status: AC
  Filled 2014-08-09: qty 2

## 2014-08-09 MED ORDER — PHENOL 1.4 % MT LIQD: 1.0000 | OROMUCOSAL | Status: DC | PRN

## 2014-08-09 MED ORDER — ONDANSETRON HCL 4 MG/2ML IJ SOLN
INTRAMUSCULAR | Status: AC
  Filled 2014-08-09: qty 2

## 2014-08-09 MED ORDER — ONDANSETRON HCL 4 MG/2ML IJ SOLN
INTRAMUSCULAR | Status: DC | PRN
  Administered 2014-08-09: 4 mg via INTRAVENOUS

## 2014-08-09 MED ORDER — EPHEDRINE SULFATE 50 MG/ML IJ SOLN
INTRAMUSCULAR | Status: DC | PRN
  Administered 2014-08-09: 10 mg via INTRAVENOUS
  Administered 2014-08-09: 5 mg via INTRAVENOUS
  Administered 2014-08-09: 10 mg via INTRAVENOUS

## 2014-08-09 MED ORDER — LIDOCAINE HCL (CARDIAC) 20 MG/ML IV SOLN
INTRAVENOUS | Status: AC
  Filled 2014-08-09: qty 5

## 2014-08-09 MED ORDER — MENTHOL 3 MG MT LOZG: 1.0000 | LOZENGE | OROMUCOSAL | Status: DC | PRN

## 2014-08-09 MED ORDER — DIPHENHYDRAMINE HCL 50 MG/ML IJ SOLN
INTRAMUSCULAR | Status: AC
  Filled 2014-08-09: qty 1

## 2014-08-09 MED ORDER — ACETAMINOPHEN 650 MG RE SUPP: 650.0000 mg | RECTAL | Status: DC | PRN

## 2014-08-09 MED ORDER — NEOSTIGMINE METHYLSULFATE 10 MG/10ML IV SOLN
INTRAVENOUS | Status: DC | PRN
  Administered 2014-08-09: 3 mg via INTRAVENOUS

## 2014-08-09 MED ORDER — SCOPOLAMINE 1 MG/3DAYS TD PT72
1.0000 | MEDICATED_PATCH | Freq: Once | TRANSDERMAL | Status: AC
  Administered 2014-08-09: 1 via TRANSDERMAL

## 2014-08-09 MED ORDER — LORATADINE 10 MG PO TABS
10.0000 mg | ORAL_TABLET | Freq: Every day | ORAL | Status: DC
  Administered 2014-08-09: 10 mg via ORAL
  Filled 2014-08-09 (×2): qty 1

## 2014-08-09 MED ORDER — BUPIVACAINE LIPOSOME 1.3 % IJ SUSP
20.0000 mL | INTRAMUSCULAR | Status: AC
  Administered 2014-08-09: 20 mL
  Filled 2014-08-09: qty 20

## 2014-08-09 MED ORDER — ALUM & MAG HYDROXIDE-SIMETH 200-200-20 MG/5ML PO SUSP: 30.0000 mL | Freq: Four times a day (QID) | ORAL | Status: DC | PRN

## 2014-08-09 MED ORDER — FLUTICASONE PROPIONATE 50 MCG/ACT NA SUSP
1.0000 | Freq: Every day | NASAL | Status: DC
  Filled 2014-08-09: qty 16

## 2014-08-09 MED ORDER — AZELASTINE HCL 0.1 % NA SOLN
2.0000 | Freq: Two times a day (BID) | NASAL | Status: DC
Start: 2014-08-09 — End: 2014-08-10
  Administered 2014-08-09: 2 via NASAL
  Filled 2014-08-09: qty 30

## 2014-08-09 MED ORDER — FLUTICASONE PROPIONATE HFA 44 MCG/ACT IN AERO
2.0000 | INHALATION_SPRAY | Freq: Two times a day (BID) | RESPIRATORY_TRACT | Status: DC
Start: 2014-08-09 — End: 2014-08-10
  Filled 2014-08-09: qty 10.6

## 2014-08-09 MED ORDER — HYDROMORPHONE HCL 1 MG/ML IJ SOLN
INTRAMUSCULAR | Status: AC
  Filled 2014-08-09: qty 1

## 2014-08-09 MED ORDER — OXYCODONE-ACETAMINOPHEN 5-325 MG PO TABS
1.0000 | ORAL_TABLET | ORAL | Status: DC | PRN
  Administered 2014-08-09 – 2014-08-10 (×3): 2 via ORAL
  Filled 2014-08-09 (×2): qty 2

## 2014-08-09 MED ORDER — DIAZEPAM 5 MG PO TABS
5.0000 mg | ORAL_TABLET | Freq: Four times a day (QID) | ORAL | Status: DC | PRN
  Administered 2014-08-09: 5 mg via ORAL

## 2014-08-09 MED ORDER — INSULIN ASPART 100 UNIT/ML ~~LOC~~ SOLN: 0.0000 [IU] | SUBCUTANEOUS | Status: DC

## 2014-08-09 MED ORDER — PHENAZOPYRIDINE HCL 200 MG PO TABS: 200.0000 mg | ORAL_TABLET | Freq: Three times a day (TID) | ORAL | Status: DC

## 2014-08-09 MED ORDER — PROMETHAZINE HCL 25 MG/ML IJ SOLN: 6.2500 mg | INTRAMUSCULAR | Status: DC | PRN

## 2014-08-09 MED ORDER — TRIMETHOPRIM 100 MG PO TABS
100.0000 mg | ORAL_TABLET | Freq: Every day | ORAL | Status: DC
  Administered 2014-08-09: 100 mg via ORAL
  Filled 2014-08-09 (×2): qty 1

## 2014-08-09 MED ORDER — HYDROMORPHONE HCL 1 MG/ML IJ SOLN
1.0000 mg | INTRAMUSCULAR | Status: DC | PRN
  Administered 2014-08-09: 1 mg via INTRAVENOUS
  Filled 2014-08-09: qty 1

## 2014-08-09 MED ORDER — PROPOFOL 10 MG/ML IV BOLUS
INTRAVENOUS | Status: AC
  Filled 2014-08-09: qty 20

## 2014-08-09 MED ORDER — LACTATED RINGERS IV SOLN
INTRAVENOUS | Status: DC | PRN
  Administered 2014-08-09 (×4): via INTRAVENOUS

## 2014-08-09 MED ORDER — HYDROMORPHONE HCL 1 MG/ML IJ SOLN
0.2500 mg | INTRAMUSCULAR | Status: DC | PRN
  Administered 2014-08-09 (×3): 0.5 mg via INTRAVENOUS

## 2014-08-09 MED ORDER — THROMBIN 20000 UNITS EX SOLR
CUTANEOUS | Status: DC | PRN
  Administered 2014-08-09: 13:00:00 via TOPICAL

## 2014-08-09 MED ORDER — METOPROLOL SUCCINATE 12.5 MG HALF TABLET
12.5000 mg | ORAL_TABLET | Freq: Every morning | ORAL | Status: DC
  Administered 2014-08-10: 12.5 mg via ORAL
  Filled 2014-08-09: qty 1

## 2014-08-09 SURGICAL SUPPLY — 70 items
BAG DECANTER FOR FLEXI CONT (MISCELLANEOUS) ×3 IMPLANT
BENZOIN TINCTURE PRP APPL 2/3 (GAUZE/BANDAGES/DRESSINGS) ×3 IMPLANT
BLADE CLIPPER SURG (BLADE) IMPLANT
BRUSH SCRUB EZ PLAIN DRY (MISCELLANEOUS) ×3 IMPLANT
BUR MATCHSTICK NEURO 3.0 LAGG (BURR) ×3 IMPLANT
BUR PRECISION FLUTE 6.0 (BURR) ×3 IMPLANT
CANISTER SUCT 3000ML (MISCELLANEOUS) ×3 IMPLANT
CAP REVERE LOCKING (Cap) ×12 IMPLANT
CLOSURE WOUND 1/2 X4 (GAUZE/BANDAGES/DRESSINGS) ×1
CLOSURE WOUND 1/4X4 (GAUZE/BANDAGES/DRESSINGS) ×1
CONT SPEC 4OZ CLIKSEAL STRL BL (MISCELLANEOUS) ×3 IMPLANT
COVER BACK TABLE 60X90IN (DRAPES) ×3 IMPLANT
DRAPE C-ARM 42X72 X-RAY (DRAPES) ×6 IMPLANT
DRAPE LAPAROTOMY 100X72X124 (DRAPES) ×3 IMPLANT
DRAPE POUCH INSTRU U-SHP 10X18 (DRAPES) ×3 IMPLANT
DRAPE PROXIMA HALF (DRAPES) ×3 IMPLANT
DRAPE SURG 17X23 STRL (DRAPES) ×12 IMPLANT
ELECT BLADE 4.0 EZ CLEAN MEGAD (MISCELLANEOUS) ×3
ELECT REM PT RETURN 9FT ADLT (ELECTROSURGICAL) ×3
ELECTRODE BLDE 4.0 EZ CLN MEGD (MISCELLANEOUS) ×1 IMPLANT
ELECTRODE REM PT RTRN 9FT ADLT (ELECTROSURGICAL) ×1 IMPLANT
EVACUATOR 1/8 PVC DRAIN (DRAIN) ×3 IMPLANT
GAUZE SPONGE 4X4 12PLY STRL (GAUZE/BANDAGES/DRESSINGS) ×3 IMPLANT
GAUZE SPONGE 4X4 16PLY XRAY LF (GAUZE/BANDAGES/DRESSINGS) ×3 IMPLANT
GLOVE BIO SURGEON STRL SZ8.5 (GLOVE) ×6 IMPLANT
GLOVE BIOGEL PI IND STRL 7.0 (GLOVE) ×1 IMPLANT
GLOVE BIOGEL PI IND STRL 7.5 (GLOVE) ×1 IMPLANT
GLOVE BIOGEL PI INDICATOR 7.0 (GLOVE) ×2
GLOVE BIOGEL PI INDICATOR 7.5 (GLOVE) ×2
GLOVE ECLIPSE 6.5 STRL STRAW (GLOVE) ×12 IMPLANT
GLOVE EXAM NITRILE LRG STRL (GLOVE) IMPLANT
GLOVE EXAM NITRILE MD LF STRL (GLOVE) IMPLANT
GLOVE EXAM NITRILE XL STR (GLOVE) IMPLANT
GLOVE EXAM NITRILE XS STR PU (GLOVE) IMPLANT
GLOVE OPTIFIT SS 6.0 STRL BRWN (GLOVE) ×9 IMPLANT
GLOVE SS BIOGEL STRL SZ 8 (GLOVE) ×2 IMPLANT
GLOVE SUPERSENSE BIOGEL SZ 8 (GLOVE) ×4
GOWN STRL REUS W/ TWL LRG LVL3 (GOWN DISPOSABLE) ×1 IMPLANT
GOWN STRL REUS W/ TWL XL LVL3 (GOWN DISPOSABLE) ×2 IMPLANT
GOWN STRL REUS W/TWL 2XL LVL3 (GOWN DISPOSABLE) IMPLANT
GOWN STRL REUS W/TWL LRG LVL3 (GOWN DISPOSABLE) ×2
GOWN STRL REUS W/TWL XL LVL3 (GOWN DISPOSABLE) ×4
KIT BASIN OR (CUSTOM PROCEDURE TRAY) ×3 IMPLANT
KIT ROOM TURNOVER OR (KITS) ×3 IMPLANT
NEEDLE HYPO 21X1.5 SAFETY (NEEDLE) ×3 IMPLANT
NEEDLE HYPO 22GX1.5 SAFETY (NEEDLE) ×3 IMPLANT
NS IRRIG 1000ML POUR BTL (IV SOLUTION) ×3 IMPLANT
PACK LAMINECTOMY NEURO (CUSTOM PROCEDURE TRAY) ×3 IMPLANT
PAD ARMBOARD 7.5X6 YLW CONV (MISCELLANEOUS) ×9 IMPLANT
PATTIES SURGICAL .5 X1 (DISPOSABLE) IMPLANT
ROD REVERE 6.35 45MM (Rod) ×6 IMPLANT
SCREW REVERE 6.35 6.5MMX45 (Screw) ×6 IMPLANT
SCREW REVERE 6.5X50MM (Screw) ×12 IMPLANT
SPACER ALTERA 10X31 10-14-8 (Spacer) ×3 IMPLANT
SPONGE GAUZE 4X4 12PLY STER LF (GAUZE/BANDAGES/DRESSINGS) ×3 IMPLANT
SPONGE LAP 4X18 X RAY DECT (DISPOSABLE) IMPLANT
SPONGE NEURO XRAY DETECT 1X3 (DISPOSABLE) IMPLANT
SPONGE SURGIFOAM ABS GEL 100 (HEMOSTASIS) ×3 IMPLANT
STRIP BIOACTIVE 20CC 25X100X8 (Miscellaneous) ×3 IMPLANT
STRIP CLOSURE SKIN 1/2X4 (GAUZE/BANDAGES/DRESSINGS) ×2 IMPLANT
STRIP CLOSURE SKIN 1/4X4 (GAUZE/BANDAGES/DRESSINGS) ×2 IMPLANT
SUT VIC AB 1 CT1 18XBRD ANBCTR (SUTURE) ×2 IMPLANT
SUT VIC AB 1 CT1 8-18 (SUTURE) ×4
SUT VIC AB 2-0 CP2 18 (SUTURE) ×6 IMPLANT
SYR 20CC LL (SYRINGE) ×3 IMPLANT
SYR 20ML ECCENTRIC (SYRINGE) ×3 IMPLANT
TOWEL OR 17X24 6PK STRL BLUE (TOWEL DISPOSABLE) ×3 IMPLANT
TOWEL OR 17X26 10 PK STRL BLUE (TOWEL DISPOSABLE) ×3 IMPLANT
TRAY FOLEY CATH 14FRSI W/METER (CATHETERS) ×3 IMPLANT
WATER STERILE IRR 1000ML POUR (IV SOLUTION) ×3 IMPLANT

## 2014-08-09 NOTE — Anesthesia Preprocedure Evaluation (Addendum)
Anesthesia Evaluation  Patient identified by MRN, date of birth, ID band Patient awake    Reviewed: Allergy & Precautions, NPO status , Patient's Chart, lab work & pertinent test results, reviewed documented beta blocker date and time   History of Anesthesia Complications (+) PONV and history of anesthetic complications  Airway Mallampati: II  TM Distance: >3 FB Neck ROM: Full    Dental  (+) Teeth Intact, Dental Advisory Given   Pulmonary neg pulmonary ROS,  breath sounds clear to auscultation  Pulmonary exam normal       Cardiovascular hypertension, Pt. on medications and Pt. on home beta blockers Rhythm:Regular Rate:Normal     Neuro/Psych  Headaches (chronic back pain), vertigo    GI/Hepatic Neg liver ROS, GERD-  Medicated and Controlled,  Endo/Other  diabetes (glu 144), Oral Hypoglycemic AgentsHypothyroidism   Renal/GU negative Renal ROS     Musculoskeletal  (+) Arthritis -, Osteoarthritis,  Fibromyalgia -  Abdominal   Peds  Hematology   Anesthesia Other Findings   Reproductive/Obstetrics                            Anesthesia Physical Anesthesia Plan  ASA: II  Anesthesia Plan: General   Post-op Pain Management:    Induction: Intravenous  Airway Management Planned: Oral ETT  Additional Equipment:   Intra-op Plan:   Post-operative Plan: Extubation in OR  Informed Consent: I have reviewed the patients History and Physical, chart, labs and discussed the procedure including the risks, benefits and alternatives for the proposed anesthesia with the patient or authorized representative who has indicated his/her understanding and acceptance.   Dental advisory given  Plan Discussed with: CRNA and Surgeon  Anesthesia Plan Comments: (Plan routine monitors, GETA)        Anesthesia Quick Evaluation

## 2014-08-09 NOTE — Transfer of Care (Signed)
Immediate Anesthesia Transfer of Care Note  Patient: CANDISE CRABTREE  Procedure(s) Performed: Procedure(s): POSTERIOR LUMBAR INTERBODY  FUSION  LUMBAR FOUR-FIVE WITH POSTERIOR LATERAL ARTHRODESIS, POSTERIOR NONSEGMENTAL INSTRUMENTATION (N/A)  Patient Location: PACU  Anesthesia Type:General  Level of Consciousness: awake, alert , oriented and patient cooperative  Airway & Oxygen Therapy: Patient Spontanous Breathing and Patient connected to nasal cannula oxygen  Post-op Assessment: Report given to PACU RN, Post -op Vital signs reviewed and stable and Patient moving all extremities X 4  Post vital signs: Reviewed and stable  Complications: No apparent anesthesia complications

## 2014-08-09 NOTE — Anesthesia Postprocedure Evaluation (Signed)
  Anesthesia Post-op Note  Patient: Debbie Hayes  Procedure(s) Performed: Procedure(s): POSTERIOR LUMBAR INTERBODY  FUSION  LUMBAR FOUR-FIVE WITH POSTERIOR LATERAL ARTHRODESIS, POSTERIOR NONSEGMENTAL INSTRUMENTATION (N/A)  Patient Location: PACU  Anesthesia Type:General  Level of Consciousness: awake, alert , oriented and patient cooperative  Airway and Oxygen Therapy: Patient Spontanous Breathing  Post-op Pain: mild  Post-op Assessment: Post-op Vital signs reviewed, Patient's Cardiovascular Status Stable, Respiratory Function Stable, Patent Airway, No signs of Nausea or vomiting and Pain level controlled  Post-op Vital Signs: stable  Last Vitals:  Filed Vitals:   08/09/14 1930  BP:   Pulse: 72  Temp:   Resp: 11    Complications: No apparent anesthesia complications

## 2014-08-09 NOTE — H&P (Signed)
Subjective: The patient is a 64 year old white female who has complained of back and leg pain consistent with neurogenic claudication. She has failed medical management and was worked up with a lumbar MRI and lumbar x-rays. This demonstrated a L4-5 spondylolisthesis with spinal stenosis. I discussed the various treatment options with the patient including surgery. She has decided to proceed with surgery.   Past Medical History  Diagnosis Date  . Rotator cuff disorder LEFT SHOULDER RTC IMPINGMENT  . Hyperlipidemia   . Normal nuclear stress test 01-25-2009  . Disorder of inner ear CAUSES VERTIGO OCCASIONALLY  . Chronic migraine BOTOX INJECTION EVERY 3 MONTHS  . Fibromyalgia   . Hemorrhoid   . Constipation   . Spondylolisthesis of lumbar region   . GERD (gastroesophageal reflux disease)   . Hypothyroidism   . History of thyroid cancer 2009  S/P TOTAL THYROIDECTOMY AND RADIATION  . Diabetes mellitus   . 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 Insomnia   . PONV (postoperative nausea and vomiting) SEVERE  . Chronic pain in right shoulder   . Left shoulder pain   . OA (osteoarthritis) JOINTS  . Hypertension CARDIOLOGIST- DR Wynonia Lawman- WILL REQUEST LATE NOTE    DENIES S & S  . Allergy   . Cataract   . History of bladder infections   . Wears glasses   . Macular degeneration     Past Surgical History  Procedure Laterality Date  . Right knee closed manipulation  09-11-2010  . Total knee arthroplasty  07-16-2010    RIGHT  . Right knee arthroscopy/ partial lateral menisectomy/ tricompartment chondroplasty/ decompression cyst  10-26-2009  . Right knee arthroscopy  X2  BEFORE 2011  . Total thyroidectomy  2009    CANCER  (POST-OP BLEED)  AND RADIATION [place]  . Left thumb joint replacement  2005    RIGHT DONE IN 2004  . Right shoulder arthroscopy  2010  &  2004  . Bilateral elbow surg.  1999  . Bilateral carpal tunnel release  1994  . Vaginal hysterectomy  1990  . Bilateral  salpingoophectomy  1993    POST-OP URETER REPAIR 12 DAYS AFTER   . Tonsillectomy  1968  . Tibia cyst removed and orif leg fx  1958  . Trigger fingers Right 2013    3rd and 4th fingers    Allergies  Allergen Reactions  . Morphine And Related Nausea And Vomiting and Other (See Comments)    SEVERE N/V AND MIGRAINES  . Other Other (See Comments)    Artificial sweetners  . Sulfa Antibiotics Hives    History  Substance Use Topics  . Smoking status: Never Smoker   . Smokeless tobacco: Never Used  . Alcohol Use: Yes     Comment: RARE    Family History  Problem Relation Age of Onset  . Colon cancer Neg Hx   . Colon polyps Sister 56  . Alzheimer's disease Mother   . Heart disease Father   . Cirrhosis Father   . Heart attack Father   . Macular degeneration Father    Prior to Admission medications   Medication Sig Start Date End Date Taking? Authorizing Provider  acetaminophen (TYLENOL) 650 MG CR tablet Take 650 mg by mouth every 8 (eight) hours as needed.   Yes Historical Provider, MD  aspirin 81 MG tablet Take 81 mg by mouth daily.   Yes Historical Provider, MD  azelastine (ASTELIN) 137 MCG/SPRAY nasal spray Place 2 sprays into both nostrils 2 (two)  times daily. Use in each nostril as directed 10/14/13  Yes [name], MD  cetirizine (ZYRTEC) 10 MG tablet Take 10 mg by mouth daily as needed for allergies.    Yes Historical Provider, MD  ibuprofen (ADVIL,MOTRIN) 200 MG tablet Take 200 mg by mouth every 6 (six) hours as needed.   Yes Historical Provider, MD  levothyroxine (SYNTHROID, LEVOTHROID) 137 MCG tablet Take 137 mcg by mouth daily before breakfast.   Yes Historical Provider, MD  losartan (COZAAR) 50 MG tablet Take 1 tablet (50 mg total) by mouth daily. Patient taking differently: Take 25 mg by mouth daily.  06/28/14  Yes [name], MD  metaxalone (SKELAXIN) 800 MG tablet Take 1 tablet (800 mg total) by mouth as needed. 03/27/14  Yes [name], MD  metFORMIN (GLUCOPHAGE)  1000 MG tablet TAKE 1 TABLET BY MOUTH 2 (TWO) TIMES DAILY WITH A MEAL. 03/30/14  Yes [name], MD  metoprolol succinate (TOPROL-XL) 25 MG 24 hr tablet Take 12.5 mg by mouth every morning.   Yes Historical Provider, MD  mometasone [facility]) 220 MCG/INH inhaler Inhale 2 puffs into the lungs daily. 10/14/13  Yes [name], MD  mometasone (NASONEX) 50 MCG/ACT nasal spray Place 2 sprays into the nose daily. 10/14/13  Yes [name], MD  Multiple Vitamins-Minerals (PRESERVISION AREDS 2 PO) Take by mouth 2 (two) times daily.   Yes Historical Provider, MD  omeprazole (PRILOSEC) 20 MG capsule Take 1 capsule (20 mg total) by mouth daily as needed (ACID REFLUX). 08/05/14  Yes [name], MD  polyethylene glycol (MIRALAX / GLYCOLAX) packet Take 17 g by mouth as needed.   Yes Historical Provider, MD  Probiotic Product (PRO-BIOTIC BLEND) CAPS Take by mouth.   Yes Historical Provider, MD  traZODone (DESYREL) 100 MG tablet Take 1-1/2-2 tablets at bedtime Patient taking differently: Take 100 mg by mouth at bedtime.  01/13/14  Yes [name], MD  trimethoprim (TRIMPEX) 100 MG tablet Take 1 tablet (100 mg total) by mouth daily. 08/04/14  Yes [name], MD  TRUETEST TEST test strip To check blood sugar tid. Dx code E11.9. 06/28/14  Yes [name], MD  zolpidem (AMBIEN) 5 MG tablet Take 2.5 mg by mouth at bedtime as needed for sleep.   Yes Historical Provider, MD  ciclopirox (PENLAC) 8 % solution APPLY TOPICALLY OVER NAIL AT BEDTIME. AFTER 7 DAYS MAY REMOVE WITH ALCOHOL AND REPEAT. Patient not taking: Reported on 06/24/2014    [name], MD  diclofenac (FLECTOR) 1.3 % PTCH PLACE 1 PATCH ONTO THE SKIN 2 TIMES DAILY. 06/28/14   [name], MD  Lancets MISC True test lancets to check blood sugar bid dx code 250.00 04/21/13   Theda Sers, PA-C  levothyroxine (SYNTHROID, LEVOTHROID) 150 MCG tablet Take 1 tablet (150 mcg total) by mouth every evening. Patient taking differently: Take 137 mcg by mouth  every evening. 137 mcg 10/14/13   [name], MD  lidocaine (LIDODERM) 5 % APPLY 1 PATCH FOR 12 HOURS OUT OF 24 HOURS 01/17/14   Mancel Bale, PA-C  ondansetron (ZOFRAN-ODT) 4 MG disintegrating tablet DISSOLVE 1 TABLET BY MOUTH EVERY 8 HOURS AS NEEDED FOR NAUSEA. 08/04/13   [name], MD  oxyCODONE (ROXICODONE) 5 MG immediate release tablet Take 1/2-1 tablet as needed for severe pain 06/24/14   [name], MD  phenazopyridine (PYRIDIUM) 200 MG tablet Take 1 tablet (200 mg total) by mouth  3 (three) times daily as needed for pain. 06/28/14   Chelle Janalee Dane, PA-C  valACYclovir (VALTREX) 1000 MG tablet Take 2 tablets by mouth twice daily for 1 day. Start at first sign of onset. 08/05/14   [name], MD     Review of Systems  Positive ROS: As above  All other systems have been reviewed and were otherwise negative with the exception of those mentioned in the HPI and as above.  Objective: Vital signs in last 24 hours: Temp:  [97.8 F (36.6 C)] 97.8 F (36.6 C) (01/13 1013) Pulse Rate:  [73] 73 (01/13 1013) Resp:  [20] 20 (01/13 1013) BP: (107)/(79) 107/79 mmHg (01/13 1013) SpO2:  [97 %] 97 % (01/13 1013) Weight:  [83.915 kg (185 lb)] 83.915 kg (185 lb) (01/13 1013)  General Appearance: Alert, cooperative, no distress, Head: Normocephalic, without obvious abnormality, atraumatic Eyes: PERRL, conjunctiva/corneas clear, EOM's intact,    Ears: Normal  Throat: Normal  Neck: Supple, symmetrical, trachea midline, no adenopathy; thyroid: No enlargement/tenderness/nodules; no carotid bruit or JVD Back: Symmetric, no curvature, ROM normal, no CVA tenderness Lungs: Clear to auscultation bilaterally, respirations unlabored Heart: Regular rate and rhythm, no murmur, rub or gallop Abdomen: Soft, non-tender,, no masses, no organomegaly Extremities: Extremities normal, atraumatic, no cyanosis or edema Pulses: 2+ and symmetric all extremities Skin: Skin color, texture, turgor normal, no rashes  or lesions  NEUROLOGIC:   Mental status: alert and oriented, no aphasia, good attention span, Fund of knowledge/ memory ok Motor Exam - grossly normal Sensory Exam - grossly normal Reflexes:  Coordination - grossly normal Gait - grossly normal Balance - grossly normal Cranial Nerves: I: smell Not tested  II: visual acuity  OS: Normal  OD: Normal   II: visual fields Full to confrontation  II: pupils Equal, round, reactive to light  III,VII: ptosis None  III,IV,VI: extraocular muscles  Full ROM  V: mastication Normal  V: facial light touch sensation  Normal  V,VII: corneal reflex  Present  VII: facial muscle function - upper  Normal  VII: facial muscle function - lower Normal  VIII: hearing Not tested  IX: soft palate elevation  Normal  IX,X: gag reflex Present  XI: trapezius strength  5/5  XI: sternocleidomastoid strength 5/5  XI: neck flexion strength  5/5  XII: tongue strength  Normal    Data Review Lab Results  Component Value Date   WBC 5.3 08/01/2014   HGB 14.2 08/01/2014   HCT 43.2 08/01/2014   MCV 87.6 08/01/2014   PLT 199 08/01/2014   Lab Results  Component Value Date   NA 137 08/01/2014   K 4.0 08/01/2014   CL 100 08/01/2014   CO2 29 08/01/2014   BUN 14 08/01/2014   CREATININE 0.59 08/01/2014   GLUCOSE 126* 08/01/2014   Lab Results  Component Value Date   INR 0.98 07/08/2010    Assessment/Plan: L4-5 spondylolisthesis, spinal stenosis, lumbago, lumbar radiculopathy, neurogenic claudication: I have discussed the situation with the patient. I have reviewed her imaging studies with her and pointed out the abnormality is. We have discussed the various treatment options including surgery. I have described the surgical treatment option of a L4-5 decompression, instrumentation, and fusion. I have shown her surgical models. We have discussed the risks, benefits, alternatives, and likelihood of achieving our goals with surgery. I have answered all the patient's  questions. She has decided to proceed with surgery.   Newman Pies D 08/09/2014 12:17 PM

## 2014-08-09 NOTE — Progress Notes (Signed)
Orthopedic Tech Progress Note Patient Details:  Debbie Hayes May 07, 1951 [id number] Called in order to [employer]. Patient ID: Debbie Hayes, female   DOB: 1950-11-26, 64 y.o.   MRN: [id number]   Darrol Poke 08/09/2014, 8:53 PM

## 2014-08-09 NOTE — Op Note (Signed)
Brief history: The patient is a 64 year old white female who has complained of back, buttock, and leg pain consistent with neurogenic claudication. She has failed medical management and was worked up with a lumbar MRI and lumbar x-rays. These studies demonstrated the patient had a L4-5 spondylolisthesis, spinal stenosis, facet arthropathy, etc. I discussed the situation with the patient. We have discussed the various treatment options including surgery. She has weighed the risks, benefits, and alternative surgery and decided proceed with an L4-5 decompression, instrumentation, and fusion.  Preoperative diagnosis: L4-5 spondylolisthesis, facet arthropathy, Degenerative disc disease, spinal stenosis compressing both the L4 and the L5 nerve roots; lumbago; lumbar radiculopathy  Postoperative diagnosis: The same  Procedure: Bilateral L4 Laminotomy/foraminotomies to decompress the bilateral L4 and L5 nerve roots(the work required to do this was in addition to the work required to do the posterior lumbar interbody fusion because of the patient's spinal stenosis, facet arthropathy. Etc. requiring a wide decompression of the nerve roots.); L4-5 transforaminal lumbar interbody fusion with local morselized autograft bone and Kinnex graft extender; insertion of interbody prosthesis at L4-5 (globus peek expandable interbody prosthesis); posterior nonsegmental instrumentation from L4 to L5 with globus titanium pedicle screws and rods; posterior lateral arthrodesis at L4-5 with local morselized autograft bone and Kinnex bone graft extender.  Surgeon: Dr. Earle Gell  Asst.: Dr. Dayton Bailiff  Anesthesia: Gen. endotracheal  Estimated blood loss: 200 mL  Drains: One medium Hemovac  Complications: None  Description of procedure: The patient was brought to the operating room by the anesthesia team. General endotracheal anesthesia was induced. The patient was turned to the prone position on the Wilson frame. The  patient's lumbosacral region was then prepared with Betadine scrub and Betadine solution. Sterile drapes were applied.  I then injected the area to be incised with Marcaine with epinephrine solution. I then used the scalpel to make a linear midline incision over the L4-5 interspace. I then used electrocautery to perform a bilateral subperiosteal dissection exposing the spinous process and lamina of L4 and L5. We then obtained intraoperative radiograph to confirm our location. We then inserted the Verstrac retractor to provide exposure.  I began the decompression by using the high speed drill to perform laminotomies at L4 bilaterally. We then used the Kerrison punches to widen the laminotomy and removed the ligamentum flavum at L4-5. We used the Kerrison punches to remove the medial facets at L4-5. We performed wide foraminotomies about the bilateral L4 and L5 nerve roots completing the decompression.  We now turned our attention to the posterior lumbar interbody fusion. I used a scalpel to incise the intervertebral disc at L4-5 bilaterally. I then performed a partial intervertebral discectomy at L4-5 bilaterally using the pituitary forceps. We prepared the vertebral endplates at Z0-0 bilaterally for the fusion by removing the soft tissues with the curettes. We then used the trial spacers to pick the appropriate sized interbody prosthesis. We prefilled his prosthesis with a combination of local morselized autograft bone that we obtained during the decompression as well as Kinnex bone graft extender. We inserted the prefilled prosthesis into the interspace at L4-5 from the left. We then expanded the prosthesis.. There was a good snug fit of the prosthesis in the interspace. We then filled and the remainder of the intervertebral disc space with local morselized autograft bone and Kinnex. This completed the posterior lumbar interbody arthrodesis.  We now turned attention to the instrumentation. Under  fluoroscopic guidance we cannulated the bilateral L4 and L5 pedicles with the bone  probe. We then removed the bone probe. We then tapped the pedicle with a 5.5 millimeter tap. We then removed the tap. We probed inside the tapped pedicle with a ball probe to rule out cortical breaches. We then inserted a 6.5 x 45 and 50 millimeter pedicle screw into the L4 and L5 pedicles bilaterally under fluoroscopic guidance. We then palpated along the medial aspect of the pedicles to rule out cortical breaches. There were none. The nerve roots were not injured. We then connected the unilateral pedicle screws with a lordotic rod. We compressed the construct and secured the rod in place with the caps. We then tightened the caps appropriately. This completed the instrumentation from L4-5.  We now turned our attention to the posterior lateral arthrodesis at L4-5. We used the high-speed drill to decorticate the remainder of the facets, pars, transverse process at L4-5. We then applied a combination of local morselized autograft bone and Kinnex bone graft extender over these decorticated posterior lateral structures. This completed the posterior lateral arthrodesis.  We then obtained hemostasis using bipolar electrocautery. We irrigated the wound out with bacitracin solution. We inspected the thecal sac and nerve roots and noted they were well decompressed. We then removed the retractor. We placed a medium Hemovac drain in the epidural space and tunneled out through separate stab wound. We reapproximated patient's thoracolumbar fascia with interrupted #1 Vicryl suture. We reapproximated patient's subcutaneous tissue with interrupted 2-0 Vicryl suture. The reapproximated patient's skin with Steri-Strips and benzoin. The wound was then coated with bacitracin ointment. A sterile dressing was applied. The drapes were removed. The patient was subsequently returned to the supine position where they were extubated by the anesthesia team.  He was then transported to the post anesthesia care unit in stable condition. All sponge instrument and needle counts were reportedly correct at the end of this case.

## 2014-08-09 NOTE — Progress Notes (Signed)
Patient ID: Debbie Hayes, female   DOB: [birth year], 64 y.o.   MRN: [id number] Subjective:  The patient is a bit somnolent but easily arousable. She looks well. She is in no apparent distress.  Objective: Vital signs in last 24 hours: Temp:  [97.8 F (36.6 C)-98.2 F (36.8 C)] 98.2 F (36.8 C) (01/13 1737) Pulse Rate:  [73-81] 81 (01/13 1737) Resp:  [18-20] 18 (01/13 1737) BP: (107-146)/(66-79) 146/66 mmHg (01/13 1737) SpO2:  [97 %-100 %] 100 % (01/13 1737) Weight:  [83.915 kg (185 lb)] 83.915 kg (185 lb) (01/13 1013)  Intake/Output from previous day:   Intake/Output this shift: Total I/O In: 3000 [I.V.:3000] Out: 1450 [Urine:1200; Blood:250]  Physical exam the patient is somnolent but arousable. She is moving her lower extremities well.  Lab Results: No results for input(s): WBC, HGB, HCT, PLT in the last 72 hours. BMET No results for input(s): NA, K, CL, CO2, GLUCOSE, BUN, CREATININE, CALCIUM in the last 72 hours.  Studies/Results: Dg Lumbar Spine 2-3 Views  08/09/2014   CLINICAL DATA:  L4-5 PLIF.  EXAM: DG C-ARM 61-120 MIN; LUMBAR SPINE - 2-3 VIEW  COMPARISON:  Lumbar MRI 06/20/2014.  Office radiographs 07/07/2014.  FLUOROSCOPY TIME:  C-arm fluoroscopic images were obtained intraoperatively and submitted for post operative interpretation. Please see the performing provider's procedural report for the fluoroscopy time utilized.  FINDINGS: Spot PA and lateral fluoroscopic images of the lower lumbar spine demonstrate placement of bilateral pedicle screws at L4 and L5. Interconnecting rods have not yet been placed. Interbody spacer appears well positioned. The L4-5 anterolisthesis is slightly improved.  IMPRESSION: Intraoperative views during L4-5 PLIF. No demonstrated complication.   Electronically Signed   By: Camie Patience M.D.   On: 08/09/2014 16:57   Dg Lumbar Spine 1 View  08/09/2014   CLINICAL DATA:  Preop cm L4-L5 fusion  EXAM: LUMBAR SPINE - 1 VIEW  COMPARISON:   07/07/2014  FINDINGS: Single lateral portable view of the lumbar spine submitted. There is about 7 mm anterolisthesis L4 on L5 vertebral body. There is posterior metallic localization instrument adjacent to inferior spinous process of L4 at the level of L4-L5 disc space.  IMPRESSION: There is posterior metallic localization instrument adjacent to inferior spinous process of L4 at the level of L4-L5 disc space.   Electronically Signed   By: Lahoma Crocker M.D.   On: 08/09/2014 16:55   Dg C-arm 1-60 Min  08/09/2014   CLINICAL DATA:  L4-5 PLIF.  EXAM: DG C-ARM 61-120 MIN; LUMBAR SPINE - 2-3 VIEW  COMPARISON:  Lumbar MRI 06/20/2014.  Office radiographs 07/07/2014.  FLUOROSCOPY TIME:  C-arm fluoroscopic images were obtained intraoperatively and submitted for post operative interpretation. Please see the performing provider's procedural report for the fluoroscopy time utilized.  FINDINGS: Spot PA and lateral fluoroscopic images of the lower lumbar spine demonstrate placement of bilateral pedicle screws at L4 and L5. Interconnecting rods have not yet been placed. Interbody spacer appears well positioned. The L4-5 anterolisthesis is slightly improved.  IMPRESSION: Intraoperative views during L4-5 PLIF. No demonstrated complication.   Electronically Signed   By: Camie Patience M.D.   On: 08/09/2014 16:57    Assessment/Plan: The patient is doing well. I spoke with her husband.  LOS: 0 days     Jeffre Enriques D 08/09/2014, 6:03 PM

## 2014-08-10 LAB — CBC
HEMATOCRIT: 34.4 % — AB (ref 36.0–46.0)
Hemoglobin: 11.3 g/dL — ABNORMAL LOW (ref 12.0–15.0)
MCH: 28.5 pg (ref 26.0–34.0)
MCHC: 32.8 g/dL (ref 30.0–36.0)
MCV: 86.6 fL (ref 78.0–100.0)
Platelets: 179 10*3/uL (ref 150–400)
RBC: 3.97 MIL/uL (ref 3.87–5.11)
RDW: 13.8 % (ref 11.5–15.5)
WBC: 9.3 10*3/uL (ref 4.0–10.5)

## 2014-08-10 LAB — BASIC METABOLIC PANEL
Anion gap: 13 (ref 5–15)
BUN: 7 mg/dL (ref 6–23)
CALCIUM: 8.6 mg/dL (ref 8.4–10.5)
CO2: 29 mmol/L (ref 19–32)
Chloride: 99 mEq/L (ref 96–112)
Creatinine, Ser: 0.58 mg/dL (ref 0.50–1.10)
GFR calc Af Amer: >90 mL/min (ref >90)
Glucose, Bld: 144 mg/dL — ABNORMAL HIGH (ref 70–99)
Potassium: 4.1 mmol/L (ref 3.5–5.1)
SODIUM: 141 mmol/L (ref 135–145)

## 2014-08-10 LAB — GLUCOSE, CAPILLARY: GLUCOSE-CAPILLARY: 157 mg/dL — AB (ref 70–99)

## 2014-08-10 MED ORDER — DSS 100 MG PO CAPS: 100.0000 mg | ORAL_CAPSULE | Freq: Two times a day (BID) | ORAL | Status: DC

## 2014-08-10 MED ORDER — OXYCODONE-ACETAMINOPHEN 10-325 MG PO TABS
1.0000 | ORAL_TABLET | ORAL | Status: DC | PRN
Start: 2014-08-10 — End: 2014-10-16

## 2014-08-10 MED ORDER — DIAZEPAM 5 MG PO TABS: 5.0000 mg | ORAL_TABLET | Freq: Four times a day (QID) | ORAL | Status: DC | PRN

## 2014-08-10 MED FILL — Heparin Sodium (Porcine) Inj 1000 Unit/ML: INTRAMUSCULAR | Qty: 30 | Status: AC

## 2014-08-10 MED FILL — Sodium Chloride IV Soln 0.9%: INTRAVENOUS | Qty: 1000 | Status: AC

## 2014-08-10 NOTE — Progress Notes (Signed)
Patient alert and oriented, mae's well, voiding adequate amount of urine, swallowing without difficulty, no c/o pain. Patient discharged home with family. Script and discharged instructions given to patient. Patient and family stated understanding of d/c instructions given and has an appointment with MD. Aisha Kyliah Deanda RN 

## 2014-08-10 NOTE — Discharge Summary (Signed)
Physician Discharge Summary  Patient ID: EDIT Debbie Hayes MRN: [id number] DOB/AGE: [birth year] 64 y.o.  Admit date: 08/09/2014 Discharge date: 08/10/2014  Admission Diagnoses: L4-5 spondylolisthesis, spinal stenosis, lumbago, facet arthropathy, lumbar radiculopathy, neurogenic claudication  Discharge Diagnoses: The same Active Problems:   Spondylolisthesis of lumbar region   Discharged Condition: good  Hospital Course: I performed an L4-5 decompression, instrumentation, and fusion on the patient on 08/09/2014. The surgery went well.  The patient's postoperative course was unremarkable. She requested discharge home on postop day #1. She was given oral and written discharge instructions. All her questions were answered.  Consults: None Significant Diagnostic Studies: None Treatments: L4-5 decompression, instrumentation, and fusion Discharge Exam: Blood pressure 100/56, pulse 67, temperature 98.2 F (36.8 C), temperature source Oral, resp. rate 20, weight 83.915 kg (185 lb), SpO2 95 %. The patient is alert and pleasant. She is in no apparent distress. She looks well. Her strength is normal in her lower extremities. Her dressing is clean and dry.  Disposition: Home  Discharge Instructions    Call MD for:  difficulty breathing, headache or visual disturbances    Complete by:  As directed      Call MD for:  extreme fatigue    Complete by:  As directed      Call MD for:  hives    Complete by:  As directed      Call MD for:  persistant dizziness or light-headedness    Complete by:  As directed      Call MD for:  persistant nausea and vomiting    Complete by:  As directed      Call MD for:  redness, tenderness, or signs of infection (pain, swelling, redness, odor or green/yellow discharge around incision site)    Complete by:  As directed      Call MD for:  severe uncontrolled pain    Complete by:  As directed      Call MD for:  temperature >100.4    Complete by:  As directed       Diet - low sodium heart healthy    Complete by:  As directed      Discharge instructions    Complete by:  As directed   Call [phone] for a followup appointment. Take a stool softener while you are using pain medications.     Driving Restrictions    Complete by:  As directed   Do not drive for 2 weeks.     Increase activity slowly    Complete by:  As directed      Lifting restrictions    Complete by:  As directed   Do not lift more than 5 pounds. No excessive bending or twisting.     May shower / Bathe    Complete by:  As directed   He may shower after the pain she is removed 3 days after surgery. Leave the incision alone.     Remove dressing in 48 hours    Complete by:  As directed   Your stitches are under the scan and will dissolve by themselves. The Steri-Strips will fall off after you take a few showers. Do not rub back or pick at the wound, Leave the wound alone.            Medication List    STOP taking these medications        acetaminophen 650 MG CR tablet  Commonly known as:  TYLENOL     diclofenac 1.3 %  Ptch  Commonly known as:  FLECTOR     ibuprofen 200 MG tablet  Commonly known as:  ADVIL,MOTRIN     oxyCODONE 5 MG immediate release tablet  Commonly known as:  ROXICODONE      TAKE these medications        aspirin 81 MG tablet  Take 81 mg by mouth daily.     azelastine 0.1 % nasal spray  Commonly known as:  ASTELIN  Place 2 sprays into both nostrils 2 (two) times daily. Use in each nostril as directed     cetirizine 10 MG tablet  Commonly known as:  ZYRTEC  Take 10 mg by mouth daily as needed for allergies.     ciclopirox 8 % solution  Commonly known as:  PENLAC  APPLY TOPICALLY OVER NAIL AT BEDTIME. AFTER 7 DAYS MAY REMOVE WITH ALCOHOL AND REPEAT.     diazepam 5 MG tablet  Commonly known as:  VALIUM  Take 1 tablet (5 mg total) by mouth every 6 (six) hours as needed for muscle spasms.     DSS 100 MG Caps  Take 100 mg by mouth 2 (two)  times daily.     Lancets Misc  True test lancets to check blood sugar bid dx code 250.00     levothyroxine 137 MCG tablet  Commonly known as:  SYNTHROID, LEVOTHROID  Take 137 mcg by mouth daily before breakfast.     levothyroxine 150 MCG tablet  Commonly known as:  SYNTHROID, LEVOTHROID  Take 1 tablet (150 mcg total) by mouth every evening.     lidocaine 5 %  Commonly known as:  LIDODERM  APPLY 1 PATCH FOR 12 HOURS OUT OF 24 HOURS     losartan 50 MG tablet  Commonly known as:  COZAAR  Take 1 tablet (50 mg total) by mouth daily.     metaxalone 800 MG tablet  Commonly known as:  SKELAXIN  Take 1 tablet (800 mg total) by mouth as needed.     metFORMIN 1000 MG tablet  Commonly known as:  GLUCOPHAGE  TAKE 1 TABLET BY MOUTH 2 (TWO) TIMES DAILY WITH A MEAL.     metoprolol succinate 25 MG 24 hr tablet  Commonly known as:  TOPROL-XL  Take 12.5 mg by mouth every morning.     mometasone 220 MCG/INH inhaler  Commonly known as:  ASMANEX  Inhale 2 puffs into the lungs daily.     mometasone 50 MCG/ACT nasal spray  Commonly known as:  NASONEX  Place 2 sprays into the nose daily.     omeprazole 20 MG capsule  Commonly known as:  PRILOSEC  Take 1 capsule (20 mg total) by mouth daily as needed (ACID REFLUX).     ondansetron 4 MG disintegrating tablet  Commonly known as:  ZOFRAN-ODT  DISSOLVE 1 TABLET BY MOUTH EVERY 8 HOURS AS NEEDED FOR NAUSEA.     oxyCODONE-acetaminophen 10-325 MG per tablet  Commonly known as:  PERCOCET  Take 1 tablet by mouth every 4 (four) hours as needed for pain.     phenazopyridine 200 MG tablet  Commonly known as:  PYRIDIUM  Take 1 tablet (200 mg total) by mouth 3 (three) times daily as needed for pain.     polyethylene glycol packet  Commonly known as:  MIRALAX / GLYCOLAX  Take 17 g by mouth as needed.     PRESERVISION AREDS 2 PO  Take by mouth 2 (two) times daily.     PRO-BIOTIC BLEND  Caps  Take by mouth.     traZODone 100 MG tablet   Commonly known as:  DESYREL  Take 1-1/2-2 tablets at bedtime     trimethoprim 100 MG tablet  Commonly known as:  TRIMPEX  Take 1 tablet (100 mg total) by mouth daily.     TRUETEST TEST test strip  Generic drug:  glucose blood  To check blood sugar tid. Dx code E11.9.     valACYclovir 1000 MG tablet  Commonly known as:  VALTREX  Take 2 tablets by mouth twice daily for 1 day. Start at first sign of onset.     zolpidem 5 MG tablet  Commonly known as:  AMBIEN  Take 2.5 mg by mouth at bedtime as needed for sleep.         SignedNewman Pies D 08/10/2014, 8:07 AM

## 2014-08-10 NOTE — Evaluation (Signed)
Physical Therapy Evaluation Patient Details Name: Debbie Hayes MRN: [id number] DOB: 21-Feb-1951 Today's Date: 08/10/2014   History of Present Illness  Pt is a 64 year old female s/p PLIF L4-L5.   Clinical Impression  Pt currently functioning between modified independent and supervision. Pt safe with all mobility and has adequate support and assistance at home. Pt has brace but stated she does not like to wear it because she does not like things tight around her waist. PT educated pt on the importance of wearing the brace, especially for long walking and out in the community. Pt verbalized understanding regarding brace usage. Pt able to verbalize precautions and maintain precautions throughout mobility. Pt does not require further acute PT and does not need PT at follow up.     Follow Up Recommendations No PT follow up;Supervision for mobility/OOB    Equipment Recommendations  None recommended by PT    Recommendations for Other Services       Precautions / Restrictions Precautions Precautions: Back;Fall Precaution Booklet Issued: Yes (comment) Precaution Comments: Provided and reviewed precaution handout.       Mobility  Bed Mobility Overal bed mobility: Modified Independent             General bed mobility comments: Pt familiar with log roll technique and able to perform with physical assistance or cuing. Pt able to mantain back precautions throughout transition.   Transfers Overall transfer level: Modified independent Equipment used: Straight cane             General transfer comment: Pt able to rise from lowered bed without cuing or assistance. Able to maintain back precautions.   Ambulation/Gait Ambulation/Gait assistance: Supervision Ambulation Distance (Feet): 200 Feet Assistive device: Straight cane Gait Pattern/deviations: Step-through pattern;Decreased step length - right;Decreased step length - left;Decreased stride length   Gait velocity  interpretation: Below normal speed for age/gender General Gait Details: Pt able to safely use cane during ambulation without cuing. Pt without any instability or loss of balance. When cued, pt no longer holding onto the wall or railing in hallway and verbalized understanding of safety precautions.   Stairs  Declined practice as pt states they are shallow and she can do stairs.          Wheelchair Mobility    Modified Rankin (Stroke Patients Only)       Balance Overall balance assessment: Needs assistance         Standing balance support: During functional activity;No upper extremity supported Standing balance-Leahy Scale: Fair Standing balance comment: Pt able to maintain static standing without support but requires cane for dynamic standing activities.                              Pertinent Vitals/Pain Pain Assessment: 0-10 Pain Score: 6  Pain Location: Incision Pain Intervention(s): Limited activity within patient's tolerance;Monitored during session;Premedicated before session;Repositioned    Home Living Family/patient expects to be discharged to:: Private residence Living Arrangements: Spouse/significant other Available Help at Discharge: Family Type of Home: House Home Access: Stairs to enter Entrance Stairs-Rails: None Entrance Stairs-Number of Steps: 2 Home Layout: One level Home Equipment: Environmental consultant - 2 wheels;Cane - single point;Shower seat;Toilet riser;Hand held shower head      Prior Function Level of Independence: Independent               Hand Dominance   Dominant Hand: Right    Extremity/Trunk Assessment  Lower Extremity Assessment: Overall WFL for tasks assessed         Communication   Communication: No difficulties  Cognition Arousal/Alertness: Awake/alert Behavior During Therapy: WFL for tasks assessed/performed Overall Cognitive Status: Within Functional Limits for tasks assessed                       General Comments      Exercises        Assessment/Plan    PT Assessment Patent does not need any further PT services  PT Diagnosis Acute pain;Difficulty walking   PT Problem List    PT Treatment Interventions     PT Goals (Current goals can be found in the Care Plan section)      Frequency     Barriers to discharge        Co-evaluation               End of Session Equipment Utilized During Treatment: Gait belt Activity Tolerance: Patient tolerated treatment well;Patient limited by pain Patient left: in bed;with call bell/phone within reach;with nursing/sitter in room Nurse Communication: Mobility status         Time: 3383-2919 PT Time Calculation (min) (ACUTE ONLY): 22 min   Charges:   PT Evaluation $Initial PT Evaluation Tier I: 1 Procedure PT Treatments $Gait Training: 8-22 mins   PT G CodesJearld Shines SPT 08/10/2014, 10:13 AM  Jearld Shines, SPT  Acute Rehabilitation [phone] [facility] Acute Rehabilitation [phone] (pager)

## 2014-08-14 ENCOUNTER — Telehealth: Payer: Self-pay | Admitting: Emergency Medicine

## 2014-08-14 NOTE — Telephone Encounter (Signed)
Spoke to patient. Initially she was constipated following her surgery now having good bowel movements.

## 2014-08-31 ENCOUNTER — Encounter (HOSPITAL_COMMUNITY): Payer: Self-pay | Admitting: Neurosurgery

## 2014-09-18 ENCOUNTER — Other Ambulatory Visit: Payer: Self-pay | Admitting: Emergency Medicine

## 2014-09-20 NOTE — Telephone Encounter (Signed)
Dr Everlene Farrier, you saw pt in Nov for check up, but don't see this discussed since last March. OK to give RFs?

## 2014-09-27 ENCOUNTER — Other Ambulatory Visit: Payer: Self-pay | Admitting: Emergency Medicine

## 2014-10-16 ENCOUNTER — Ambulatory Visit (INDEPENDENT_AMBULATORY_CARE_PROVIDER_SITE_OTHER): Payer: 59 | Admitting: Emergency Medicine

## 2014-10-16 VITALS — BP 116/74 | HR 90 | Temp 98.6°F | Resp 16 | Wt 182.0 lb

## 2014-10-16 DIAGNOSIS — R5383 Other fatigue: Secondary | ICD-10-CM | POA: Diagnosis not present

## 2014-10-16 DIAGNOSIS — Z13820 Encounter for screening for osteoporosis: Secondary | ICD-10-CM

## 2014-10-16 DIAGNOSIS — C73 Malignant neoplasm of thyroid gland: Secondary | ICD-10-CM | POA: Diagnosis not present

## 2014-10-16 DIAGNOSIS — E119 Type 2 diabetes mellitus without complications: Secondary | ICD-10-CM

## 2014-10-16 LAB — POCT CBC
Granulocyte percent: 62.5 %G (ref 37–80)
HCT, POC: 44 % (ref 37.7–47.9)
Hemoglobin: 14.1 g/dL (ref 12.2–16.2)
LYMPH, POC: 1.9 (ref 0.6–3.4)
MCH: 27.7 pg (ref 27–31.2)
MCHC: 32.1 g/dL (ref 31.8–35.4)
MCV: 86.2 fL (ref 80–97)
MID (cbc): 0.7 (ref 0–0.9)
MPV: 7.74 fL (ref 0–99.8)
POC Granulocyte: 4.3 (ref 2–6.9)
POC LYMPH PERCENT: 27.6 %L (ref 10–50)
POC MID %: 9.9 % (ref 0–12)
Platelet Count, POC: 246 10*3/uL (ref 142–424)
RBC: 5.1 M/uL (ref 4.04–5.48)
RDW, POC: 13.8 %
WBC: 6.9 10*3/uL (ref 4.6–10.2)

## 2014-10-16 LAB — POCT GLYCOSYLATED HEMOGLOBIN (HGB A1C): Hemoglobin A1C: 6.1

## 2014-10-16 LAB — GLUCOSE, POCT (MANUAL RESULT ENTRY): POC Glucose: 148 mg/dl — AB (ref 70–99)

## 2014-10-16 NOTE — Progress Notes (Addendum)
Subjective:  .This chart was scribed for [name] MD, by Tamsen Roers, at [facility].  This patient was seen in room 10 and the patient's care was started at 12:14 PM.    Patient ID: Debbie Hayes, female    DOB: 12-May-1951, 64 y.o.   MRN: [id number]  HPI  HPI Comments: Debbie Hayes is a 64 y.o. female who presents to [facility] for a follow-up.  Patient notes that she has been very fatigued recently but notes that she feels better after her surgery 2 months ago.  Patient was prescribed the wrong medication (Valtrex instead of Trimpex)  due to an error when it was ordered and would like to make sure it does create issues on her medical record.Patient notes that she is on Metformin and says her sugar has been doing "okay".  Patient had cereal this morning.  She would also like to have blood work done today.  Patient is scheduled for a mammogram in April.  She has no other complaints.    Patient Active Problem List   Diagnosis Date Noted  . Spondylolisthesis of lumbar region 08/09/2014  . Back pain 02/17/2014  . Macular degeneration 02/17/2014  . Other and unspecified hyperlipidemia 10/14/2013  . Onychomycosis 12/01/2012  . Diabetes mellitus 04/12/2012  . Arthritis 04/12/2012  . Hypothyroid 04/12/2012   Past Medical History  Diagnosis Date  . Rotator cuff disorder LEFT SHOULDER RTC IMPINGMENT  . Hyperlipidemia   . Normal nuclear stress test 01-25-2009  . Disorder of inner ear CAUSES VERTIGO OCCASIONALLY  . Chronic migraine BOTOX INJECTION EVERY 3 MONTHS  . Fibromyalgia   . Hemorrhoid   . Constipation   . Spondylolisthesis of lumbar region   . GERD (gastroesophageal reflux disease)   . Hypothyroidism   . History of thyroid cancer 2009  S/P TOTAL THYROIDECTOMY AND RADIATION  . Diabetes mellitus   . 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 Insomnia   . PONV (postoperative nausea and vomiting) SEVERE  . Chronic pain in  right shoulder   . Left shoulder pain   . OA (osteoarthritis) JOINTS  . Hypertension CARDIOLOGIST- DR Wynonia Lawman- WILL REQUEST LATE NOTE    DENIES S & S  . Allergy   . Cataract   . History of bladder infections   . Wears glasses   . Macular degeneration    Past Surgical History  Procedure Laterality Date  . Right knee closed manipulation  09-11-2010  . Total knee arthroplasty  07-16-2010    RIGHT  . Right knee arthroscopy/ partial lateral menisectomy/ tricompartment chondroplasty/ decompression cyst  10-26-2009  . Right knee arthroscopy  X2  BEFORE 2011  . Total thyroidectomy  2009    CANCER  (POST-OP BLEED)  AND RADIATION TX  . Left thumb joint replacement  2005    RIGHT DONE IN 2004  . Right shoulder arthroscopy  2010  &  2004  . Bilateral elbow surg.  1999  . Bilateral carpal tunnel release  1994  . Vaginal hysterectomy  1990  . Bilateral salpingoophectomy  1993    POST-OP URETER REPAIR 12 DAYS AFTER   . Tonsillectomy  1968  . Tibia cyst removed and orif leg fx  1958  . Trigger fingers Right 2013    3rd and 4th fingers   Allergies  Allergen Reactions  . Morphine And Related Nausea And Vomiting and Other (See Comments)    SEVERE N/V AND MIGRAINES  .  Other Other (See Comments)    Artificial sweetners  . Sulfa Antibiotics Hives   Prior to Admission medications   Medication Sig Start Date End Date Taking? Authorizing Provider  aspirin 81 MG tablet Take 81 mg by mouth daily.   Yes Historical Provider, MD  azelastine (ASTELIN) 137 MCG/SPRAY nasal spray Place 2 sprays into both nostrils 2 (two) times daily. Use in each nostril as directed 10/14/13  Yes [name], MD  cetirizine (ZYRTEC) 10 MG tablet Take 10 mg by mouth daily as needed for allergies.    Yes Historical Provider, MD  Lancets MISC True test lancets to check blood sugar bid dx code 250.00 04/21/13  Yes Eleanore E Egan, PA-C  levothyroxine (SYNTHROID, LEVOTHROID) 137 MCG tablet Take 137 mcg by mouth daily before  breakfast.   Yes Historical Provider, MD  lidocaine (LIDODERM) 5 % APPLY 1 PATCH FOR 12 HOURS OUT OF 24 HOURS 01/17/14  Yes Mancel Bale, PA-C  losartan (COZAAR) 50 MG tablet Take 1 tablet (50 mg total) by mouth daily. Patient taking differently: Take 25 mg by mouth daily.  06/28/14  Yes [name], MD  metaxalone (SKELAXIN) 800 MG tablet Take 1 tablet (800 mg total) by mouth as needed. 03/27/14  Yes [name], MD  metFORMIN (GLUCOPHAGE) 1000 MG tablet TAKE 1 TABLET BY MOUTH 2 (TWO) TIMES DAILY WITH A MEAL. 03/30/14  Yes [name], MD  metoprolol succinate (TOPROL-XL) 25 MG 24 hr tablet Take 12.5 mg by mouth every morning.   Yes Historical Provider, MD  mometasone [facility]) 220 MCG/INH inhaler Inhale 2 puffs into the lungs daily. 10/14/13  Yes [name], MD  Multiple Vitamins-Minerals (PRESERVISION AREDS 2 PO) Take by mouth 2 (two) times daily.   Yes Historical Provider, MD  NASONEX 50 MCG/ACT nasal spray PLACE 2 SPRAYS INTO THE NOSE DAILY. 09/20/14  Yes [name], MD  nitrofurantoin, macrocrystal-monohydrate, (MACROBID) 100 MG capsule Take 100 mg by mouth 2 (two) times daily.   Yes Historical Provider, MD  omeprazole (PRILOSEC) 20 MG capsule Take 1 capsule (20 mg total) by mouth daily as needed (ACID REFLUX). 08/05/14  Yes [name], MD  ondansetron (ZOFRAN-ODT) 4 MG disintegrating tablet DISSOLVE 1 TABLET BY MOUTH EVERY 8 HOURS AS NEEDED FOR NAUSEA. 08/04/13  Yes [name], MD  phenazopyridine (PYRIDIUM) 200 MG tablet Take 1 tablet (200 mg total) by mouth 3 (three) times daily as needed for pain. 06/28/14  Yes Chelle S Jeffery, PA-C  Probiotic Product (PRO-BIOTIC BLEND) CAPS Take by mouth.   Yes Historical Provider, MD  traZODone (DESYREL) 100 MG tablet Take 1-1/2-2 tablets at bedtime Patient taking differently: Take 100 mg by mouth at bedtime.  01/13/14  Yes [name], MD  trimethoprim (TRIMPEX) 100 MG tablet Take 1 tablet (100 mg total) by mouth daily. 08/04/14  Yes [name], MD  TRUETEST TEST test strip To check blood sugar tid. Dx code E11.9. 06/28/14  Yes [name], MD  zolpidem (AMBIEN) 5 MG tablet TAKE 1 TABLET BY MOUTH AT BEDTIME AS NEEDED FOR SLEEP 09/28/14  Yes [name], MD  valACYclovir (VALTREX) 1000 MG tablet Take 2 tablets by mouth twice daily for 1 day. Start at first sign of onset. Patient not taking: Reported on 10/16/2014 08/05/14   [name], MD   History   Social History  . Marital Status: Married    Spouse Name: N/A  . Number of Children:  N/A  . Years of Education: N/A   Occupational History  . Not on file.   Social History Main Topics  . Smoking status: Never Smoker   . Smokeless tobacco: Never Used  . Alcohol Use: Yes     Comment: RARE  . Drug Use: No  . Sexual Activity: Not on file   Other Topics Concern  . Not on file   Social History Narrative         Review of Systems  Constitutional: Positive for fatigue. Negative for fever and chills.  HENT: Negative for drooling and nosebleeds.   Eyes: Negative for discharge.  Gastrointestinal: Negative for nausea and vomiting.       Objective:   Physical Exam CONSTITUTIONAL: Well developed/well nourished, alert and cooperative.  HEAD: Normocephalic/atraumatic EYES: EOMI/PERRL ENMT: Mucous membranes moist NECK: supple no meningeal signs SPINE/BACK:entire spine nontender, Well healed scar over the lumbar spine.  CV: S1/S2 noted, no murmurs/rubs/gallops noted LUNGS: Lungs are clear to auscultation bilaterally, no apparent distress ABDOMEN: soft, nontender, no rebound or guarding, bowel sounds noted throughout abdomen GU:no cva tenderness NEURO: Pt is awake/alert/appropriate, moves all extremitiesx4.  No facial droop.   EXTREMITIES: pulses normal/equal, full ROM SKIN: warm, color normal PSYCH: no abnormalities of mood noted, alert and oriented to situation Results for orders placed or performed in visit on 10/16/14  POCT CBC  Result Value Ref Range   WBC  6.9 4.6 - 10.2 K/uL   Lymph, poc 1.9 0.6 - 3.4   POC LYMPH PERCENT 27.6 10 - 50 %L   MID (cbc) 0.7 0 - 0.9   POC MID % 9.9 0 - 12 %M   POC Granulocyte 4.3 2 - 6.9   Granulocyte percent 62.5 37 - 80 %G   RBC 5.10 4.04 - 5.48 M/uL   Hemoglobin 14.1 12.2 - 16.2 g/dL   HCT, POC 44.0 37.7 - 47.9 %   MCV 86.2 80 - 97 fL   MCH, POC 27.7 27 - 31.2 pg   MCHC 32.1 31.8 - 35.4 g/dL   RDW, POC 13.8 %   Platelet Count, POC 246 142 - 424 K/uL   MPV 7.74 0 - 99.8 fL  POCT glucose (manual entry)  Result Value Ref Range   POC Glucose 148 (A) 70 - 99 mg/dl  POCT glycosylated hemoglobin (Hb A1C)  Result Value Ref Range   Hemoglobin A1C 6.1      Filed Vitals:   10/16/14 1159  BP: 116/74  Pulse: 90  Temp: 98.6 F (37 C)  TempSrc: Oral  Resp: 16  SpO2: 96%        Assessment and plan  patient looks great no change in medications her A1c is down to 6.1. I personally performed the services described in this documentation, which was scribed in my presence. The recorded information has been reviewed and is accurate.

## 2014-10-17 LAB — THYROGLOBULIN ANTIBODY

## 2014-10-17 LAB — T4, FREE: FREE T4: 1.38 ng/dL (ref 0.80–1.80)

## 2014-10-17 LAB — TSH: TSH: 1.291 u[IU]/mL (ref 0.350–4.500)

## 2014-10-17 LAB — MICROALBUMIN, URINE: Microalb, Ur: 0.3 mg/dL (ref <2.0)

## 2014-10-31 ENCOUNTER — Ambulatory Visit (INDEPENDENT_AMBULATORY_CARE_PROVIDER_SITE_OTHER): Payer: 59 | Admitting: Family Medicine

## 2014-10-31 VITALS — BP 116/72 | HR 89 | Temp 98.0°F | Resp 18 | Ht 67.5 in | Wt 183.0 lb

## 2014-10-31 DIAGNOSIS — Z23 Encounter for immunization: Secondary | ICD-10-CM

## 2014-10-31 DIAGNOSIS — R300 Dysuria: Secondary | ICD-10-CM

## 2014-10-31 DIAGNOSIS — N390 Urinary tract infection, site not specified: Secondary | ICD-10-CM | POA: Diagnosis not present

## 2014-10-31 LAB — POCT UA - MICROSCOPIC ONLY
CRYSTALS, UR, HPF, POC: NEGATIVE
Casts, Ur, LPF, POC: NEGATIVE
Mucus, UA: NEGATIVE
Yeast, UA: NEGATIVE

## 2014-10-31 LAB — POCT URINALYSIS DIPSTICK
Bilirubin, UA: NEGATIVE
GLUCOSE UA: NEGATIVE
Ketones, UA: NEGATIVE
Nitrite, UA: NEGATIVE
Protein, UA: NEGATIVE
Spec Grav, UA: 1.015
Urobilinogen, UA: 0.2
pH, UA: 5.5

## 2014-10-31 MED ORDER — CEPHALEXIN 500 MG PO CAPS: 500.0000 mg | ORAL_CAPSULE | Freq: Two times a day (BID) | ORAL | Status: DC

## 2014-10-31 MED ORDER — PNEUMOCOCCAL VAC POLYVALENT 25 MCG/0.5ML IJ INJ: 0.5000 mL | INJECTION | Freq: Once | INTRAMUSCULAR | Status: DC

## 2014-10-31 NOTE — Progress Notes (Signed)
10/31/2014 at 3:53 PM  Debbie Hayes / DOB: 1951/05/05 / MRN: [id number]  The patient has Diabetes mellitus; Arthritis; Hypothyroid; Onychomycosis; Other and unspecified hyperlipidemia; Back pain; Macular degeneration; and Spondylolisthesis of lumbar region on her problem list.  SUBJECTIVE  Chief complaint: Urinary Tract Infection; burning with urination; and Immunizations   History of present illness: [name] is 64 y.o. well appearing female presenting for for dysuria that started two days ago. She has a long history of UTI's and has been taking Macrobid when she feels that she is getting an infection.  Her last infection February and she took 10 days of Macrobid and her symptoms abated.  She is also taking SMX-TMP at night for UTI prophylaxis, which is prescribed by her urologist. She also complains of urgency.  She denies frequency and suprapubic pressure.  She check her urine at home with home testing kits and reports she is positive for nitrites and leukocytes.  She reports one episode of delirium that occurred while taking cipro, but she was also dehydrated at that time as she was traveling in [place].     She is a diabetic and just turned 69 and would like a pneumonia vaccination today.   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    She has a current medication list which includes the following prescription(s): aspirin, azelastine, cetirizine, lancets, levothyroxine, lidocaine, losartan, metaxalone, metformin, metoprolol succinate, mometasone, multiple vitamins-minerals, nasonex, nitrofurantoin (macrocrystal-monohydrate), omeprazole, ondansetron, phenazopyridine, pro-biotic blend, trazodone, trimethoprim, truetest test, zolpidem, and cephalexin.  Ms. England is allergic to morphine and related; other; and sulfa antibiotics. She  reports that she has never smoked. She has never used smokeless tobacco. She reports that she drinks alcohol. She reports that she does not use illicit drugs. She  has no sexual activity history on file. The patient  has past surgical history that includes RIGHT KNEE CLOSED MANIPULATION (09-11-2010); Total knee arthroplasty (07-16-2010); RIGHT KNEE ARTHROSCOPY/ PARTIAL LATERAL MENISECTOMY/ TRICOMPARTMENT CHONDROPLASTY/ DECOMPRESSION CYST (10-26-2009); RIGHT KNEE ARTHROSCOPY (X2  BEFORE 2011); Total thyroidectomy (2009); LEFT THUMB JOINT REPLACEMENT (2005); RIGHT SHOULDER ARTHROSCOPY (2010  &  2004); BILATERAL ELBOW SURG. (1999); BILATERAL CARPAL TUNNEL RELEASE (1994); Vaginal hysterectomy (1990); BILATERAL SALPINGOOPHECTOMY (1993); Tonsillectomy (1968); TIBIA CYST REMOVED AND ORIF LEG FX (1958); and trigger fingers (Right, 2013).  Her family history includes Alzheimer's disease in her mother; Cirrhosis in her father; Colon polyps (age of onset: 68) in her sister; Heart attack in her father; Heart disease in her father; Macular degeneration in her father. There is no history of Colon cancer.  Review of Systems  Constitutional: Negative for fever and chills.  Gastrointestinal: Negative for nausea and abdominal pain.  Musculoskeletal: Negative for back pain.  Neurological: Negative for dizziness.     OBJECTIVE  Her  height is 5' 7.5" (1.715 m) and weight is 183 lb (83.008 kg). Her oral temperature is 98 F (36.7 C). Her blood pressure is 116/72 and  her pulse is 89. Her respiration is 18 and oxygen saturation is 98%.  The patient's body mass index is 28.22 kg/(m^2).  Physical Exam  Constitutional: She is oriented to person, place, and time.  Cardiovascular: Normal rate, regular rhythm and normal heart sounds.   Respiratory: Effort normal and breath sounds normal.  GI: Soft. Bowel sounds are normal. There is no tenderness. There is no CVA tenderness.  Neurological: She is alert and oriented to person, place, and time.  Skin: Skin is warm and dry.  Psychiatric: She has a normal mood and affect.    Results for orders placed or performed in visit on 10/31/14 (from the past 24 hour(s))  POCT urinalysis dipstick     Status: None   Collection Time: 10/31/14  3:41 PM  Result Value Ref Range   Color, UA yelow    Clarity, UA cloudy    Glucose, UA neg    Bilirubin, UA neg    Ketones, UA neg    Spec Grav, UA 1.015    Blood, UA small    pH, UA 5.5    Protein, UA neg    Urobilinogen, UA 0.2    Nitrite, UA neg    Leukocytes, UA large (3+)   POCT UA - Microscopic Only     Status: None   Collection Time: 10/31/14  3:42 PM  Result Value Ref Range   WBC, Ur, HPF, POC 10-20    RBC, urine, microscopic 2-5    Bacteria, U Microscopic 3+    Mucus, UA neg    Epithelial cells, urine per micros 0-2    Crystals, Ur, HPF, POC neg    Casts, Ur, LPF, POC neg    Yeast, UA neg     ASSESSMENT & PLAN  Carlinda was seen today for urinary tract infection, burning with urination and immunizations.  Diagnoses and all orders for this visit:  Burning with urination Orders: -     POCT urinalysis dipstick -     POCT UA - Microscopic Only  Need for prophylactic vaccination against Streptococcus pneumoniae (pneumococcus) Orders: -     Pneumococcal conjugate vaccine 13-valent IM  UTI (lower  urinary tract infection) Orders: -     cephALEXin (KEFLEX) 500 MG capsule; Take 1 capsule (500 mg total) by mouth 2 (two) times daily. -     Urine culture    The patient was advised to call or come back to clinic if she does not see an improvement in symptoms, or worsens with the above plan.   Philis Fendt, MHS, PA-C [facility] 10/31/2014 3:53 PM

## 2014-11-02 NOTE — Progress Notes (Signed)
Reviewed documentation and agree w/ assessment and plan. Jesstin Studstill, MD MPH 

## 2014-11-03 LAB — URINE CULTURE: Colony Count: >=100000

## 2014-11-07 ENCOUNTER — Other Ambulatory Visit: Payer: Self-pay | Admitting: Emergency Medicine

## 2014-12-22 ENCOUNTER — Other Ambulatory Visit: Payer: Self-pay | Admitting: Emergency Medicine

## 2015-01-03 ENCOUNTER — Ambulatory Visit (INDEPENDENT_AMBULATORY_CARE_PROVIDER_SITE_OTHER): Payer: 59

## 2015-01-03 ENCOUNTER — Ambulatory Visit (INDEPENDENT_AMBULATORY_CARE_PROVIDER_SITE_OTHER): Payer: 59 | Admitting: Emergency Medicine

## 2015-01-03 VITALS — BP 102/70 | HR 90 | Temp 98.1°F | Resp 16 | Ht 68.0 in | Wt 183.8 lb

## 2015-01-03 DIAGNOSIS — R059 Cough, unspecified: Secondary | ICD-10-CM

## 2015-01-03 DIAGNOSIS — R05 Cough: Secondary | ICD-10-CM | POA: Diagnosis not present

## 2015-01-03 DIAGNOSIS — M5440 Lumbago with sciatica, unspecified side: Secondary | ICD-10-CM | POA: Diagnosis not present

## 2015-01-03 DIAGNOSIS — M542 Cervicalgia: Secondary | ICD-10-CM | POA: Diagnosis not present

## 2015-01-03 DIAGNOSIS — K219 Gastro-esophageal reflux disease without esophagitis: Secondary | ICD-10-CM | POA: Diagnosis not present

## 2015-01-03 DIAGNOSIS — J4520 Mild intermittent asthma, uncomplicated: Secondary | ICD-10-CM | POA: Diagnosis not present

## 2015-01-03 LAB — POCT CBC
Granulocyte percent: 63.8 %G (ref 37–80)
HEMATOCRIT: 44.6 % (ref 37.7–47.9)
HEMOGLOBIN: 14.3 g/dL (ref 12.2–16.2)
LYMPH, POC: 1.6 (ref 0.6–3.4)
MCH, POC: 27.1 pg (ref 27–31.2)
MCHC: 32.1 g/dL (ref 31.8–35.4)
MCV: 84.5 fL (ref 80–97)
MID (cbc): 0.3 (ref 0–0.9)
MPV: 7.6 fL (ref 0–99.8)
PLATELET COUNT, POC: 235 10*3/uL (ref 142–424)
POC Granulocyte: 3.5 (ref 2–6.9)
POC LYMPH PERCENT: 30 %L (ref 10–50)
POC MID %: 6.2 % (ref 0–12)
RBC: 5.28 M/uL (ref 4.04–5.48)
RDW, POC: 14.2 %
WBC: 5.5 10*3/uL (ref 4.6–10.2)

## 2015-01-03 LAB — VITAMIN B12: VITAMIN B 12: 297 pg/mL (ref 211–911)

## 2015-01-03 MED ORDER — BENZONATATE 100 MG PO CAPS: 100.0000 mg | ORAL_CAPSULE | Freq: Three times a day (TID) | ORAL | Status: DC | PRN

## 2015-01-03 NOTE — Progress Notes (Addendum)
Subjective:   This chart was scribed for [name], MD by Thea Alken, ED Scribe. This patient was seen in room 4 and the patient's care was started at 9:55 AM.   Patient ID: Debbie Hayes, female    DOB: [birth year], 64 y.o.   MRN: [id number]  HPI Chief Complaint  Patient presents with  . Cough   HPI Comments: Debbie Hayes is a 64 y.o. female who presents to the [facility] complaining of non productive cough. Pt states she has a chronic cough, possibly due to allergies and has been taking asmanex with very little relief. She reports mild SOB and wheeze with exercise only. Pt reports GERD has been well controlled and has been taking probiotic. States she has not rebounded well since having back surgery. Pt has been trying to walk for exercise.   Past Medical History  Diagnosis Date  . Rotator cuff disorder LEFT SHOULDER RTC IMPINGMENT  . Hyperlipidemia   . Normal nuclear stress test 01-25-2009  . Disorder of inner ear CAUSES VERTIGO OCCASIONALLY  . Chronic migraine BOTOX INJECTION EVERY 3 MONTHS  . Fibromyalgia   . Hemorrhoid   . Constipation   . Spondylolisthesis of lumbar region   . GERD (gastroesophageal reflux disease)   . Hypothyroidism   . History of thyroid cancer 2009  S/P TOTAL THYROIDECTOMY AND RADIATION  . Diabetes mellitus   . 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 Insomnia   . PONV (postoperative nausea and vomiting) SEVERE  . Chronic pain in right shoulder   . Left shoulder pain   . OA (osteoarthritis) JOINTS  . Hypertension CARDIOLOGIST- DR Wynonia Lawman- WILL REQUEST LATE NOTE    DENIES S & S  . Allergy   . Cataract   . History of bladder infections   . Wears glasses   . Macular degeneration    Allergies  Allergen Reactions  . Morphine And Related Nausea And Vomiting and Other (See Comments)    SEVERE N/V AND MIGRAINES  . Other Other (See Comments)    Artificial sweetners  . Sulfa Antibiotics Hives   Prior to Admission  medications   Medication Sig Start Date End Date Taking? Authorizing Provider  aspirin 81 MG tablet Take 81 mg by mouth daily.   Yes Historical Provider, MD  azelastine (ASTELIN) 137 MCG/SPRAY nasal spray Place 2 sprays into both nostrils 2 (two) times daily. Use in each nostril as directed 10/14/13  Yes [name], MD  cetirizine (ZYRTEC) 10 MG tablet Take 10 mg by mouth daily as needed for allergies.    Yes Historical Provider, MD  Lancets MISC True test lancets to check blood sugar bid dx code 250.00 04/21/13  Yes Eleanore E Egan, PA-C  levothyroxine (SYNTHROID, LEVOTHROID) 137 MCG tablet Take 137 mcg by mouth daily before breakfast.   Yes Historical Provider, MD  lidocaine (LIDODERM) 5 % APPLY 1 PATCH FOR 12 HOURS OUT OF 24 HOURS 01/17/14  Yes Mancel Bale, PA-C  losartan (COZAAR) 50 MG tablet Take 1 tablet (50 mg total) by mouth daily. Patient taking differently: Take 25 mg by mouth daily.  06/28/14  Yes [name], MD  metaxalone (SKELAXIN) 800 MG tablet Take 1 tablet (800 mg total) by mouth as needed. 03/27/14  Yes [name], MD  metFORMIN (GLUCOPHAGE) 1000 MG tablet TAKE 1 TABLET BY MOUTH 2 TIMES DAILY WITH A MEAL. 11/07/14  Yes [name], MD  metoprolol succinate (TOPROL-XL) 25  MG 24 hr tablet Take 12.5 mg by mouth every morning.   Yes Historical Provider, MD  mometasone [facility]) 220 MCG/INH inhaler Inhale 2 puffs into the lungs daily. 10/14/13  Yes [name], MD  Multiple Vitamins-Minerals (PRESERVISION AREDS 2 PO) Take by mouth 2 (two) times daily.   Yes Historical Provider, MD  NASONEX 50 MCG/ACT nasal spray PLACE 2 SPRAYS INTO THE NOSE DAILY. 09/20/14  Yes [name], MD  ondansetron (ZOFRAN-ODT) 4 MG disintegrating tablet DISSOLVE 1 TABLET BY MOUTH EVERY 8 HOURS AS NEEDED FOR NAUSEA. 12/25/14  Yes Shawnee Knapp, MD  Probiotic Product (PRO-BIOTIC BLEND) CAPS Take by mouth.   Yes Historical Provider, MD  traZODone (DESYREL) 100 MG tablet Take 1-1/2-2 tablets at  bedtime Patient taking differently: Take 100 mg by mouth at bedtime.  01/13/14  Yes [name], MD  trimethoprim (TRIMPEX) 100 MG tablet Take 1 tablet (100 mg total) by mouth daily. 08/04/14  Yes [name], MD  TRUETEST TEST test strip To check blood sugar tid. Dx code E11.9. 06/28/14  Yes [name], MD  zolpidem (AMBIEN) 5 MG tablet TAKE 1 TABLET BY MOUTH AT BEDTIME AS NEEDED FOR SLEEP 09/28/14  Yes [name], MD  phenazopyridine (PYRIDIUM) 200 MG tablet Take 1 tablet (200 mg total) by mouth 3 (three) times daily as needed for pain. Patient not taking: Reported on 01/03/2015 06/28/14   Harrison Mons, PA-C   Past Surgical History  Procedure Laterality Date  . Right knee closed manipulation  09-11-2010  . Total knee arthroplasty  07-16-2010    RIGHT  . Right knee arthroscopy/ partial lateral menisectomy/ tricompartment chondroplasty/ decompression cyst  10-26-2009  . Right knee arthroscopy  X2  BEFORE 2011  . Total thyroidectomy  2009    CANCER  (POST-OP BLEED)  AND RADIATION TX  . Left thumb joint replacement  2005    RIGHT DONE IN 2004  . Right shoulder arthroscopy  2010  &  2004  . Bilateral elbow surg.  1999  . Bilateral carpal tunnel release  1994  . Vaginal hysterectomy  1990  . Bilateral salpingoophectomy  1993    POST-OP URETER REPAIR 12 DAYS AFTER   . Tonsillectomy  1968  . Tibia cyst removed and orif leg fx  1958  . Trigger fingers Right 2013    3rd and 4th fingers    Review of Systems  Respiratory: Positive for cough, shortness of breath and wheezing.   Musculoskeletal: Negative.        Patient having increasing difficulty with her neck. She has pain with twisting to the left. She is a history of arthritis in her neck.   Objective:   Physical Exam CONSTITUTIONAL: Well developed/well nourished HEAD: Normocephalic/atraumatic EYES: EOMI/PERRL ENMT: Mucous membranes moist NECK: supple no meningeal signs SPINE/BACK:entire spine nontender CV: S1/S2 noted, no  murmurs/rubs/gallops noted LUNGS: , no apparent distress, occasional wheeze. ABDOMEN: soft, nontender, no rebound or guarding, bowel sounds noted throughout abdomen GU:no cva tenderness NEURO: Pt is awake/alert/appropriate, moves all extremitiesx4.  No facial droop. Healing scar over Lumbar spine.    EXTREMITIES: pulses normal/equal, full ROM SKIN: warm, color normal PSYCH: no abnormalities of mood noted, alert and oriented to situation [facility] reading (PRIMARY) by  Dr. Everlene Farrier mild increased markings over the right diaphragm. Results for orders placed or performed in visit on 01/03/15  POCT CBC  Result Value Ref Range   WBC 5.5 4.6 - 10.2 K/uL   Lymph, poc 1.6  0.6 - 3.4   POC LYMPH PERCENT 30.0 10 - 50 %L   MID (cbc) 0.3 0 - 0.9   POC MID % 6.2 0 - 12 %M   POC Granulocyte 3.5 2 - 6.9   Granulocyte percent 63.8 37 - 80 %G   RBC 5.28 4.04 - 5.48 M/uL   Hemoglobin 14.3 12.2 - 16.2 g/dL   HCT, POC 44.6 37.7 - 47.9 %   MCV 84.5 80 - 97 fL   MCH, POC 27.1 27 - 31.2 pg   MCHC 32.1 31.8 - 35.4 g/dL   RDW, POC 14.2 %   Platelet Count, POC 235 142 - 424 K/uL   MPV 7.6 0 - 99.8 fL   Filed Vitals:   01/03/15 0948  BP: 102/70  Pulse: 90  Temp: 98.1 F (36.7 C)  TempSrc: Oral  Resp: 16  Height: 5\' 8"  (1.727 m)  Weight: 183 lb 12.8 oz (83.371 kg)  SpO2: 98%    [facility] reading (PRIMARY) by Dr. Everlene Farrier  Assessment & Plan:  I do think she should stay on Asmanex. I think it would be reasonable to start her

## 2015-01-03 NOTE — Patient Instructions (Addendum)
Take Zyrtec one a day. Use Asmanex once a day. Take Zantac 150 mg twice a day. Use Tessalon Perles as needed. I have made an appointment for you to go to physical therapy for your neck

## 2015-01-04 LAB — HM DIABETES EYE EXAM

## 2015-01-18 ENCOUNTER — Encounter: Payer: Self-pay | Admitting: Physical Therapy

## 2015-01-18 ENCOUNTER — Ambulatory Visit: Payer: 59 | Attending: Emergency Medicine | Admitting: Physical Therapy

## 2015-01-18 DIAGNOSIS — M542 Cervicalgia: Secondary | ICD-10-CM | POA: Insufficient documentation

## 2015-01-18 DIAGNOSIS — M436 Torticollis: Secondary | ICD-10-CM | POA: Diagnosis not present

## 2015-01-18 NOTE — Therapy (Signed)
[address] [place], [place], [address] Phone: [phone]   Fax:  [phone]  Physical Therapy Evaluation  Patient Details  Name: Debbie Hayes MRN: [id number] Date of Birth: October 19, 1950 Referring Provider:  [name], MD  Encounter Date: 01/18/2015      PT End of Session - 01/18/15 1443    Visit Number 1   Date for PT Re-Evaluation 03/20/15   PT Start Time 1350   PT Stop Time 1443   PT Time Calculation (min) 53 min   Activity Tolerance Patient tolerated treatment well      Past Medical History  Diagnosis Date  . Rotator cuff disorder LEFT SHOULDER RTC IMPINGMENT  . Hyperlipidemia   . Normal nuclear stress test 01-25-2009  . Disorder of inner ear CAUSES VERTIGO OCCASIONALLY  . Chronic migraine BOTOX INJECTION EVERY 3 MONTHS  . Fibromyalgia   . Hemorrhoid   . Constipation   . Spondylolisthesis of lumbar region   . GERD (gastroesophageal reflux disease)   . Hypothyroidism   . History of thyroid cancer 2009  S/P TOTAL THYROIDECTOMY AND RADIATION  . Diabetes mellitus   . 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 Insomnia   . PONV (postoperative nausea and vomiting) SEVERE  . Chronic pain in right shoulder   . Left shoulder pain   . OA (osteoarthritis) JOINTS  . Hypertension CARDIOLOGIST- DR Wynonia Lawman- WILL REQUEST LATE NOTE    DENIES S & S  . Allergy   . Cataract   . History of bladder infections   . Wears glasses   . Macular degeneration     Past Surgical History  Procedure Laterality Date  . Right knee closed manipulation  09-11-2010  . Total knee arthroplasty  07-16-2010    RIGHT  . Right knee arthroscopy/ partial lateral menisectomy/ tricompartment chondroplasty/ decompression cyst  10-26-2009  . Right knee arthroscopy  X2  BEFORE 2011  . Total thyroidectomy  2009    CANCER  (POST-OP BLEED)  AND RADIATION [place]  . Left thumb joint replacement  2005    RIGHT DONE IN 2004  .  Right shoulder arthroscopy  2010  &  2004  . Bilateral elbow surg.  1999  . Bilateral carpal tunnel release  1994  . Vaginal hysterectomy  1990  . Bilateral salpingoophectomy  1993    POST-OP URETER REPAIR 12 DAYS AFTER   . Tonsillectomy  1968  . Tibia cyst removed and orif leg fx  1958  . Trigger fingers Right 2013    3rd and 4th fingers    There were no vitals filed for this visit.  Visit Diagnosis:  Neck pain - Plan: PT plan of care cert/re-cert  Stiffness of neck - Plan: PT plan of care cert/re-cert      Subjective Assessment - 01/18/15 1400    Subjective Reports she has had neck pain for about 10 years, she reports some difficulty looking to the left, difficult with driving   Diagnostic tests arthritis   Patient Stated Goals know what I can do to move better and have less pain   Currently in Pain? Yes   Pain Score 5    Pain Location Neck   Pain Orientation Left   Pain Descriptors / Indicators Grimacing;Tightness   Pain Type Chronic pain   Pain Onset More than a month ago   Pain Frequency Intermittent   Aggravating Factors  turning head    Pain Relieving  Factors if at rest   Effect of Pain on Daily Activities difficulty driving            [facility] PT Assessment - 01/18/15 0001    Assessment   Medical Diagnosis neck pain   Onset Date/Surgical Date 01/04/15   Hand Dominance Right   Prior Therapy in past for shoulders   Precautions   Precautions None   Balance Screen   Has the patient fallen in the past 6 months No   Has the patient had a decrease in activity level because of a fear of falling?  No   Is the patient reluctant to leave their home because of a fear of falling?  No   Home Ecologist residence   Prior Function   Level of Independence Independent   Leisure likes to sew and has some pain in the neck with sewing   AROM   Overall AROM Comments AROM of the c-spine decreased 50% for extension and left rotation, and side  bending, other motions decreased 25%   Palpation   Palpation comment very tight iwth spasms in the upper traps, knots in the traps                   [facility] Adult PT Treatment/Exercise - 01/18/15 0001    Modalities   Modalities Ultrasound   Ultrasound   Ultrasound Location bilateral c-p-spinals and upper traps   Ultrasound Parameters 100% 1.5 w/cm   Ultrasound Goals Pain                PT Education - 01/18/15 1441    Education provided Yes   Education Details HEP, for cervical isometrics   Person(s) Educated Patient   Methods Explanation;Demonstration;Handout   Comprehension Verbalized understanding          PT Short Term Goals - 01/18/15 1449    PT SHORT TERM GOAL #1   Title independent with initial HEP   Time 1   Period Weeks   Status New           PT Long Term Goals - 01/18/15 1449    PT LONG TERM GOAL #1   Title independent with proper posture and body mechanics   Time 8   Period Weeks   Status New   PT LONG TERM GOAL #2   Title increase left cervical rotation by 25%   Time 8   Period Weeks   Status New   PT LONG TERM GOAL #3   Title reports 25% easier to back up car   Time 8   Period Weeks   Status New   PT LONG TERM GOAL #4   Title decrease pain 25%   Time 8   Period Weeks   Status New               Plan - 01/18/15 1446    Clinical Impression Statement Patient with neck pain with left rotation and right sidebending, possibly indicative of facet issue.  Difficulty backing up car.  Spasms in the upper trap and c-p-spinals   Pt will benefit from skilled therapeutic intervention in order to improve on the following deficits Decreased range of motion;Decreased strength;Increased muscle spasms;Pain   Rehab Potential Good   PT Frequency 1x / week   PT Duration 8 weeks   PT Treatment/Interventions Moist Heat;Traction;Ultrasound;Electrical Stimulation;Therapeutic exercise;Patient/family education;Manual techniques   PT Next  Visit Plan may add some easy STM, or US/estim combo   Consulted  and Agree with Plan of Care Patient         Problem List Patient Active Problem List   Diagnosis Date Noted  . Spondylolisthesis of lumbar region 08/09/2014  . Back pain 02/17/2014  . Macular degeneration 02/17/2014  . Other and unspecified hyperlipidemia 10/14/2013  . Onychomycosis 12/01/2012  . Diabetes mellitus 04/12/2012  . Arthritis 04/12/2012  . Hypothyroid 04/12/2012    Sumner Boast., PT 01/18/2015, 3:03 PM  [address] [place], [place], [address] Phone: [phone]   Fax:  [phone]

## 2015-01-25 ENCOUNTER — Ambulatory Visit: Payer: 59 | Admitting: Rehabilitation

## 2015-01-31 ENCOUNTER — Encounter: Payer: 59 | Admitting: Physician Assistant

## 2015-02-02 ENCOUNTER — Ambulatory Visit: Payer: 59 | Attending: Emergency Medicine | Admitting: Physical Therapy

## 2015-02-02 ENCOUNTER — Encounter: Payer: Self-pay | Admitting: Physical Therapy

## 2015-02-02 DIAGNOSIS — M542 Cervicalgia: Secondary | ICD-10-CM | POA: Insufficient documentation

## 2015-02-02 DIAGNOSIS — M436 Torticollis: Secondary | ICD-10-CM | POA: Insufficient documentation

## 2015-02-02 NOTE — Therapy (Signed)
[facility] [place], [place], [address] Phone: [phone]   Fax:  [phone]  Physical Therapy Treatment  Patient Details  Name: Debbie Hayes MRN: [id number] Date of Birth: 03-16-1951 Referring Provider:  [name], MD  Encounter Date: 02/02/2015      PT End of Session - 02/02/15 1042    Visit Number 2   Date for PT Re-Evaluation 03/20/15   PT Start Time 1010   PT Stop Time 1040   PT Time Calculation (min) 30 min      Past Medical History  Diagnosis Date  . Rotator cuff disorder LEFT SHOULDER RTC IMPINGMENT  . Hyperlipidemia   . Normal nuclear stress test 01-25-2009  . Disorder of inner ear CAUSES VERTIGO OCCASIONALLY  . Chronic migraine BOTOX INJECTION EVERY 3 MONTHS  . Fibromyalgia   . Hemorrhoid   . Constipation   . Spondylolisthesis of lumbar region   . GERD (gastroesophageal reflux disease)   . Hypothyroidism   . History of thyroid cancer 2009  S/P TOTAL THYROIDECTOMY AND RADIATION  . Diabetes mellitus   . 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 Insomnia   . PONV (postoperative nausea and vomiting) SEVERE  . Chronic pain in right shoulder   . Left shoulder pain   . OA (osteoarthritis) JOINTS  . Hypertension CARDIOLOGIST- DR Wynonia Lawman- WILL REQUEST LATE NOTE    DENIES S & S  . Allergy   . Cataract   . History of bladder infections   . Wears glasses   . Macular degeneration     Past Surgical History  Procedure Laterality Date  . Right knee closed manipulation  09-11-2010  . Total knee arthroplasty  07-16-2010    RIGHT  . Right knee arthroscopy/ partial lateral menisectomy/ tricompartment chondroplasty/ decompression cyst  10-26-2009  . Right knee arthroscopy  X2  BEFORE 2011  . Total thyroidectomy  2009    CANCER  (POST-OP BLEED)  AND RADIATION TX  . Left thumb joint replacement  2005    RIGHT DONE IN 2004  . Right shoulder arthroscopy  2010  &  2004  . Bilateral  elbow surg.  1999  . Bilateral carpal tunnel release  1994  . Vaginal hysterectomy  1990  . Bilateral salpingoophectomy  1993    POST-OP URETER REPAIR 12 DAYS AFTER   . Tonsillectomy  1968  . Tibia cyst removed and orif leg fx  1958  . Trigger fingers Right 2013    3rd and 4th fingers    There were no vitals filed for this visit.  Visit Diagnosis:  Neck pain  Stiffness of neck      Subjective Assessment - 02/02/15 1040    Subjective still most pain with turning   Currently in Pain? Yes   Pain Score 5    Pain Location Neck   Pain Orientation Left                         [facility] Adult PT Treatment/Exercise - 02/02/15 0001    Modalities   Modalities --  COMBO   Ultrasound   Ultrasound Location bilateral UT and cerv   Ultrasound Parameters 100% 1.3 w/cm2 estim to tolerance   Ultrasound Goals Pain  tightness   Manual Therapy   Manual Therapy Soft tissue mobilization;Myofascial release;Passive ROM;Muscle Energy Technique  to increase rotation and SB  PT Short Term Goals - 02/02/15 1044    PT SHORT TERM GOAL #1   Title independent with initial HEP   Status Achieved           PT Long Term Goals - 01/18/15 1449    PT LONG TERM GOAL #1   Title independent with proper posture and body mechanics   Time 8   Period Weeks   Status New   PT LONG TERM GOAL #2   Title increase left cervical rotation by 25%   Time 8   Period Weeks   Status New   PT LONG TERM GOAL #3   Title reports 25% easier to back up car   Time 8   Period Weeks   Status New   PT LONG TERM GOAL #4   Title decrease pain 25%   Time 8   Period Weeks   Status New               Plan - 02/02/15 1042    Clinical Impression Statement increased ROM and decreased after MT, verb doing HEP   PT Next Visit Plan continue with Manual therapy        Problem List Patient Active Problem List   Diagnosis Date Noted  . Spondylolisthesis of lumbar region  08/09/2014  . Back pain 02/17/2014  . Macular degeneration 02/17/2014  . Other and unspecified hyperlipidemia 10/14/2013  . Onychomycosis 12/01/2012  . Diabetes mellitus 04/12/2012  . Arthritis 04/12/2012  . Hypothyroid 04/12/2012    PAYSEUR,ANGIE PTA 02/02/2015, 10:46 AM  [address] [place], [place], [address] Phone: [phone]   Fax:  [phone]

## 2015-02-05 ENCOUNTER — Other Ambulatory Visit: Payer: Self-pay | Admitting: Emergency Medicine

## 2015-02-05 ENCOUNTER — Other Ambulatory Visit: Payer: Self-pay | Admitting: Family Medicine

## 2015-02-05 DIAGNOSIS — A499 Bacterial infection, unspecified: Secondary | ICD-10-CM

## 2015-02-05 DIAGNOSIS — N390 Urinary tract infection, site not specified: Principal | ICD-10-CM

## 2015-02-05 MED ORDER — CEPHALEXIN 500 MG PO CAPS: 500.0000 mg | ORAL_CAPSULE | Freq: Three times a day (TID) | ORAL | Status: DC

## 2015-02-06 ENCOUNTER — Ambulatory Visit: Payer: 59 | Admitting: Physical Therapy

## 2015-02-06 DIAGNOSIS — M542 Cervicalgia: Secondary | ICD-10-CM | POA: Diagnosis not present

## 2015-02-06 DIAGNOSIS — M436 Torticollis: Secondary | ICD-10-CM

## 2015-02-06 NOTE — Therapy (Signed)
[place] [place], [place], [address] Phone: [phone]   Fax:  [phone]  Physical Therapy Treatment  Patient Details  Name: Debbie Hayes MRN: [id number] Date of Birth: 05-May-1951 Referring Provider:  [name], MD  Encounter Date: 02/06/2015      PT End of Session - 02/06/15 1011    Visit Number 3   Date for PT Re-Evaluation 03/20/15   PT Start Time 0930   PT Stop Time 1010   PT Time Calculation (min) 40 min      Past Medical History  Diagnosis Date  . Rotator cuff disorder LEFT SHOULDER RTC IMPINGMENT  . Hyperlipidemia   . Normal nuclear stress test 01-25-2009  . Disorder of inner ear CAUSES VERTIGO OCCASIONALLY  . Chronic migraine BOTOX INJECTION EVERY 3 MONTHS  . Fibromyalgia   . Hemorrhoid   . Constipation   . Spondylolisthesis of lumbar region   . GERD (gastroesophageal reflux disease)   . Hypothyroidism   . History of thyroid cancer 2009  S/P TOTAL THYROIDECTOMY AND RADIATION  . Diabetes mellitus   . 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 Insomnia   . PONV (postoperative nausea and vomiting) SEVERE  . Chronic pain in right shoulder   . Left shoulder pain   . OA (osteoarthritis) JOINTS  . Hypertension CARDIOLOGIST- DR Wynonia Lawman- WILL REQUEST LATE NOTE    DENIES S & S  . Allergy   . Cataract   . History of bladder infections   . Wears glasses   . Macular degeneration     Past Surgical History  Procedure Laterality Date  . Right knee closed manipulation  09-11-2010  . Total knee arthroplasty  07-16-2010    RIGHT  . Right knee arthroscopy/ partial lateral menisectomy/ tricompartment chondroplasty/ decompression cyst  10-26-2009  . Right knee arthroscopy  X2  BEFORE 2011  . Total thyroidectomy  2009    CANCER  (POST-OP BLEED)  AND RADIATION TX  . Left thumb joint replacement  2005    RIGHT DONE IN 2004  . Right shoulder arthroscopy  2010  &  2004  . Bilateral  elbow surg.  1999  . Bilateral carpal tunnel release  1994  . Vaginal hysterectomy  1990  . Bilateral salpingoophectomy  1993    POST-OP URETER REPAIR 12 DAYS AFTER   . Tonsillectomy  1968  . Tibia cyst removed and orif leg fx  1958  . Trigger fingers Right 2013    3rd and 4th fingers    There were no vitals filed for this visit.  Visit Diagnosis:  Neck pain  Stiffness of neck      Subjective Assessment - 02/06/15 1008    Subjective felt sore after last session but would like to do it again because it did help after a day or so   Currently in Pain? Yes   Pain Score 5    Pain Location Neck            [facility] PT Assessment - 02/06/15 0001    AROM   Overall AROM Comments cerv ROM decreased 50% rotation and lateral flexion                     [facility] Adult PT Treatment/Exercise - 02/06/15 0001    Modalities   Modalities --  COMBO   Ultrasound   Ultrasound Location bilateral UT and carv   Ultrasound Goals Pain  Manual Therapy   Manual Therapy Soft tissue mobilization;Myofascial release;Passive ROM;Muscle Energy Technique  to increase rotation and SB                PT Education - 02/06/15 1011    Education provided Yes   Education Details cerv stretches   Person(s) Educated Patient   Methods Explanation;Demonstration   Comprehension Verbalized understanding;Returned demonstration          PT Short Term Goals - 02/02/15 1044    PT SHORT TERM GOAL #1   Title independent with initial HEP   Status Achieved           PT Long Term Goals - 02/06/15 1012    PT LONG TERM GOAL #1   Title independent with proper posture and body mechanics   Status On-going   PT LONG TERM GOAL #2   Title increase left cervical rotation by 25%   Status On-going   PT LONG TERM GOAL #3   Title reports 25% easier to back up car   Status On-going   PT LONG TERM GOAL #4   Title decrease pain 25%   Status On-going               Plan - 02/06/15 1012     Clinical Impression Statement large trigger point Left UT,cerv,facet pain on RT cerv with mvmt. decreased pain and increased ROM after treatment. Pt educated on stretches as she is head out of country 3 weeks.   PT Next Visit Plan assess pain and ROM after return from trip in 3 weeks        Problem List Patient Active Problem List   Diagnosis Date Noted  . Spondylolisthesis of lumbar region 08/09/2014  . Back pain 02/17/2014  . Macular degeneration 02/17/2014  . Other and unspecified hyperlipidemia 10/14/2013  . Onychomycosis 12/01/2012  . Diabetes mellitus 04/12/2012  . Arthritis 04/12/2012  . Hypothyroid 04/12/2012    PAYSEUR,ANGIE PTA 02/06/2015, 10:14 AM  [address] [place], [place], [address] Phone: [phone]   Fax:  [phone]

## 2015-02-19 ENCOUNTER — Ambulatory Visit (INDEPENDENT_AMBULATORY_CARE_PROVIDER_SITE_OTHER): Payer: 59 | Admitting: Ophthalmology

## 2015-03-02 ENCOUNTER — Encounter: Payer: Self-pay | Admitting: Physical Therapy

## 2015-03-02 ENCOUNTER — Ambulatory Visit: Payer: 59 | Attending: Emergency Medicine | Admitting: Physical Therapy

## 2015-03-02 DIAGNOSIS — M542 Cervicalgia: Secondary | ICD-10-CM | POA: Diagnosis not present

## 2015-03-02 DIAGNOSIS — M436 Torticollis: Secondary | ICD-10-CM | POA: Diagnosis present

## 2015-03-02 NOTE — Therapy (Signed)
[place] [facility] [place], [place], [address] Phone: [phone]   Fax:  [phone]  Physical Therapy Treatment  Patient Details  Name: Debbie Hayes MRN: [id number] Date of Birth: 1950/10/11 Referring Provider:  [name], MD  Encounter Date: 03/02/2015      PT End of Session - 03/02/15 1044    Visit Number 4   Date for PT Re-Evaluation 03/20/15   PT Start Time 1014   PT Stop Time 1100   PT Time Calculation (min) 46 min   Activity Tolerance Patient tolerated treatment well   Behavior During Therapy [facility] for tasks assessed/performed      Past Medical History  Diagnosis Date  . Rotator cuff disorder LEFT SHOULDER RTC IMPINGMENT  . Hyperlipidemia   . Normal nuclear stress test 01-25-2009  . Disorder of inner ear CAUSES VERTIGO OCCASIONALLY  . Chronic migraine BOTOX INJECTION EVERY 3 MONTHS  . Fibromyalgia   . Hemorrhoid   . Constipation   . Spondylolisthesis of lumbar region   . GERD (gastroesophageal reflux disease)   . Hypothyroidism   . History of thyroid cancer 2009  S/P TOTAL THYROIDECTOMY AND RADIATION  . Diabetes mellitus   . 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 Insomnia   . PONV (postoperative nausea and vomiting) SEVERE  . Chronic pain in right shoulder   . Left shoulder pain   . OA (osteoarthritis) JOINTS  . Hypertension CARDIOLOGIST- DR Wynonia Lawman- WILL REQUEST LATE NOTE    DENIES S & S  . Allergy   . Cataract   . History of bladder infections   . Wears glasses   . Macular degeneration     Past Surgical History  Procedure Laterality Date  . Right knee closed manipulation  09-11-2010  . Total knee arthroplasty  07-16-2010    RIGHT  . Right knee arthroscopy/ partial lateral menisectomy/ tricompartment chondroplasty/ decompression cyst  10-26-2009  . Right knee arthroscopy  X2  BEFORE 2011  . Total thyroidectomy  2009    CANCER  (POST-OP BLEED)  AND RADIATION TX  . Left  thumb joint replacement  2005    RIGHT DONE IN 2004  . Right shoulder arthroscopy  2010  &  2004  . Bilateral elbow surg.  1999  . Bilateral carpal tunnel release  1994  . Vaginal hysterectomy  1990  . Bilateral salpingoophectomy  1993    POST-OP URETER REPAIR 12 DAYS AFTER   . Tonsillectomy  1968  . Tibia cyst removed and orif leg fx  1958  . Trigger fingers Right 2013    3rd and 4th fingers    There were no vitals filed for this visit.  Visit Diagnosis:  Neck pain  Stiffness of neck      Subjective Assessment - 03/02/15 1041    Subjective Patient was out of town the past 3-4 weeks caring for a new grandchild, she did travel and reports that she is having some increased pain in the neck   Currently in Pain? Yes   Pain Score 6    Pain Location Neck   Pain Orientation Left   Pain Descriptors / Indicators Tightness;Spasm   Pain Type Chronic pain                         [facility] Adult PT Treatment/Exercise - 03/02/15 0001    Modalities   Modalities Electrical Stimulation   Electrical Stimulation  Electrical Stimulation Location left upper trap   Electrical Stimulation Action IFC   Electrical Stimulation Parameters TOLERANCE   Electrical Stimulation Goals Pain   Ultrasound   Ultrasound Location left upper trap and into the c-p-spinals   Ultrasound Parameters 100% with estim combo treatment   Ultrasound Goals Pain   Manual Therapy   Manual Therapy Soft tissue mobilization;Myofascial release;Passive ROM;Muscle Energy Technique   Soft tissue mobilization left upper trpa, into the paraspinals and rhomboids                  PT Short Term Goals - 02/02/15 1044    PT SHORT TERM GOAL #1   Title independent with initial HEP   Status Achieved           PT Long Term Goals - 03/02/15 1047    PT LONG TERM GOAL #1   Title independent with proper posture and body mechanics   Status On-going   PT LONG TERM GOAL #2   Title increase left cervical  rotation by 25%   Status On-going               Plan - 03/02/15 1045    Clinical Impression Statement continues to have trigger point and knots in the left upper trap and rhomboid.  May have exacerbated with travel and care for grandchild   PT Next Visit Plan work on spasms   Consulted and Agree with Plan of Care Patient        Problem List Patient Active Problem List   Diagnosis Date Noted  . Spondylolisthesis of lumbar region 08/09/2014  . Back pain 02/17/2014  . Macular degeneration 02/17/2014  . Other and unspecified hyperlipidemia 10/14/2013  . Onychomycosis 12/01/2012  . Diabetes mellitus 04/12/2012  . Arthritis 04/12/2012  . Hypothyroid 04/12/2012    Sumner Boast., PT 03/02/2015, 10:48 AM  [address] [place], [place], [address] Phone: [phone]   Fax:  [phone]

## 2015-03-06 ENCOUNTER — Ambulatory Visit: Payer: 59 | Admitting: Physical Therapy

## 2015-03-08 ENCOUNTER — Other Ambulatory Visit: Payer: Self-pay

## 2015-03-08 MED ORDER — GLUCOSE BLOOD VI STRP: ORAL_STRIP | Status: DC

## 2015-03-08 MED ORDER — LANCETS MISC: Status: DC

## 2015-03-08 MED ORDER — BLOOD GLUCOSE MONITOR KIT: PACK | Status: AC

## 2015-03-20 ENCOUNTER — Ambulatory Visit (INDEPENDENT_AMBULATORY_CARE_PROVIDER_SITE_OTHER): Payer: 59 | Admitting: Family Medicine

## 2015-03-20 VITALS — BP 128/80 | HR 89 | Temp 98.5°F | Resp 16 | Ht 68.0 in | Wt 184.0 lb

## 2015-03-20 DIAGNOSIS — R350 Frequency of micturition: Secondary | ICD-10-CM

## 2015-03-20 DIAGNOSIS — N3001 Acute cystitis with hematuria: Secondary | ICD-10-CM

## 2015-03-20 LAB — POCT URINALYSIS DIPSTICK
BILIRUBIN UA: NEGATIVE
GLUCOSE UA: NEGATIVE
KETONES UA: NEGATIVE
NITRITE UA: NEGATIVE
Protein, UA: NEGATIVE
Spec Grav, UA: 1.01
Urobilinogen, UA: 0.2
pH, UA: 5.5

## 2015-03-20 LAB — POCT UA - MICROSCOPIC ONLY
CASTS, UR, LPF, POC: NEGATIVE
CRYSTALS, UR, HPF, POC: NEGATIVE
Mucus, UA: NEGATIVE
YEAST UA: NEGATIVE

## 2015-03-20 MED ORDER — CEPHALEXIN 500 MG PO CAPS: 500.0000 mg | ORAL_CAPSULE | Freq: Two times a day (BID) | ORAL | Status: DC

## 2015-03-20 NOTE — Progress Notes (Signed)
Subjective:  This chart was scribed for Merri Ray, MD by Moises Blood, Medical Scribe. This patient was seen in room 4 and the patient's care was started 2:53 PM.    Patient ID: Debbie Hayes, female    DOB: [birth year], 64 y.o.   MRN: [id number]  HPI Debbie Hayes is a 64 y.o. female Here for possible UTI. Last treated here with UTI in April with keflex 500 BID for 7 days.  Urine culture at the time was positive for E. Coli, sensitive to cephalosporin.   Patient notes having frequent UTI. She states this time started around noon today. She has taken pyridium self treatment once at home. Her last UTI was a month ago while in [place] after a week getting there with nocturia and frequency. She has UTI medications at home (Macrobid or Keflex, and used Keflex when she was in [place]) and also takes trimethoprim at night. She denies fever, diarrhea, chills, vomiting, back pain, nausea.   She saw her urologist Dr. Consuella Lose earlier this year. 4 UTIs last year, but no new daily medications at this time. She informed having kidney infection 6 years ago and has been having UTIs since.    Patient Active Problem List   Diagnosis Date Noted  . Spondylolisthesis of lumbar region 08/09/2014  . Back pain 02/17/2014  . Macular degeneration 02/17/2014  . Other and unspecified hyperlipidemia 10/14/2013  . Onychomycosis 12/01/2012  . Diabetes mellitus 04/12/2012  . Arthritis 04/12/2012  . Hypothyroid 04/12/2012   Past Medical History  Diagnosis Date  . Rotator cuff disorder LEFT SHOULDER RTC IMPINGMENT  . Hyperlipidemia   . Normal nuclear stress test 01-25-2009  . Disorder of inner ear CAUSES VERTIGO OCCASIONALLY  . Chronic migraine BOTOX INJECTION EVERY 3 MONTHS  . Fibromyalgia   . Hemorrhoid   . Constipation   . Spondylolisthesis of lumbar region   . GERD (gastroesophageal reflux disease)   . Hypothyroidism   . History of thyroid cancer 2009  S/P TOTAL THYROIDECTOMY  AND RADIATION  . Diabetes mellitus   . 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 Insomnia   . PONV (postoperative nausea and vomiting) SEVERE  . Chronic pain in right shoulder   . Left shoulder pain   . OA (osteoarthritis) JOINTS  . Hypertension CARDIOLOGIST- DR Wynonia Lawman- WILL REQUEST LATE NOTE    DENIES S & S  . Allergy   . Cataract   . History of bladder infections   . Wears glasses   . Macular degeneration    Past Surgical History  Procedure Laterality Date  . Right knee closed manipulation  09-11-2010  . Total knee arthroplasty  07-16-2010    RIGHT  . Right knee arthroscopy/ partial lateral menisectomy/ tricompartment chondroplasty/ decompression cyst  10-26-2009  . Right knee arthroscopy  X2  BEFORE 2011  . Total thyroidectomy  2009    CANCER  (POST-OP BLEED)  AND RADIATION [place]  . Left thumb joint replacement  2005    RIGHT DONE IN 2004  . Right shoulder arthroscopy  2010  &  2004  . Bilateral elbow surg.  1999  . Bilateral carpal tunnel release  1994  . Vaginal hysterectomy  1990  . Bilateral salpingoophectomy  1993    POST-OP URETER REPAIR 12 DAYS AFTER   . Tonsillectomy  1968  . Tibia cyst removed and orif leg fx  1958  . Trigger fingers Right 2013    3rd and 4th fingers   Allergies  Allergen Reactions  . Other Other (See Comments)    Artificial sweetners  . Sulfa Antibiotics Hives   Prior to Admission medications   Medication Sig Start Date End Date Taking? Authorizing Provider  aspirin 81 MG tablet Take 81 mg by mouth daily.   Yes Historical Provider, MD  azelastine (ASTELIN) 137 MCG/SPRAY nasal spray Place 2 sprays into both nostrils 2 (two) times daily. Use in each nostril as directed 10/14/13  Yes [name], MD  benzonatate (TESSALON) 100 MG capsule Take 1-2 capsules (100-200 mg total) by mouth 3 (three) times daily as needed for cough. 01/03/15  Yes [name], MD  cetirizine (ZYRTEC) 10 MG tablet Take 10 mg by mouth daily as needed for allergies.    Yes  Historical Provider, MD  glucose blood test strip Test blood sugar once daily. Dx code: E11.9 03/08/15  Yes [name], MD  Lancets MISC Test blood sugar once daily. Dx code: E11.9 03/08/15  Yes [name], MD  levothyroxine (SYNTHROID, LEVOTHROID) 137 MCG tablet Take 137 mcg by mouth daily before breakfast.   Yes Historical Provider, MD  lidocaine (LIDODERM) 5 % APPLY 1 PATCH FOR 12 HOURS OUT OF 24 HOURS 01/17/14  Yes Mancel Bale, PA-C  losartan (COZAAR) 50 MG tablet TAKE 1 TABLET BY MOUTH ONCE DAILY 02/06/15  Yes [name], MD  metaxalone (SKELAXIN) 800 MG tablet Take 1 tablet (800 mg total) by mouth as needed. 03/27/14  Yes [name], MD  metFORMIN (GLUCOPHAGE) 1000 MG tablet TAKE 1 TABLET BY MOUTH 2 TIMES DAILY WITH A MEAL. 02/06/15  Yes [name], MD  metoprolol succinate (TOPROL-XL) 25 MG 24 hr tablet Take 12.5 mg by mouth every morning.   Yes Historical Provider, MD  Multiple Vitamins-Minerals (PRESERVISION AREDS 2 PO) Take by mouth 2 (two) times daily.   Yes Historical Provider, MD  NASONEX 50 MCG/ACT nasal spray PLACE 2 SPRAYS INTO THE NOSE DAILY. 09/20/14  Yes [name], MD  ondansetron (ZOFRAN-ODT) 4 MG disintegrating tablet DISSOLVE 1 TABLET BY MOUTH EVERY 8 HOURS AS NEEDED FOR NAUSEA. 12/25/14  Yes Shawnee Knapp, MD  phenazopyridine (PYRIDIUM) 200 MG tablet Take 1 tablet (200 mg total) by mouth 3 (three) times daily as needed for pain. 06/28/14  Yes Chelle Jeffery, PA-C  Probiotic Product (PRO-BIOTIC BLEND) CAPS Take by mouth.   Yes Historical Provider, MD  traZODone (DESYREL) 100 MG tablet TAKE 1-1/2-2 TABLETS BY MOUTH AT BEDTIME 02/06/15  Yes [name], MD  trimethoprim (TRIMPEX) 100 MG tablet Take 1 tablet (100 mg total) by mouth daily. 08/04/14  Yes [name], MD  zolpidem (AMBIEN) 5 MG tablet TAKE 1 TABLET BY MOUTH AT BEDTIME AS NEEDED FOR SLEEP 09/28/14  Yes [name], MD  blood glucose meter kit and supplies KIT Test blood sugar once daily. Dx code: E11.9  03/08/15   [name], MD  cephALEXin (KEFLEX) 500 MG capsule Take 1 capsule (500 mg total) by mouth 3 (three) times daily. Patient not taking: Reported on 03/20/2015 02/05/15   Thao P Le, DO  mometasone (ASMANEX) 220 MCG/INH inhaler Inhale 2 puffs into the lungs daily. Patient not taking: Reported on 03/20/2015 10/14/13   [name], MD   Social History   Social History  . Marital Status: Married    Spouse Name: N/A  . Number of Children: N/A  . Years of Education: N/A   Occupational History  . Not on  file.   Social History Main Topics  . Smoking status: Never Smoker   . Smokeless tobacco: Never Used  . Alcohol Use: Yes     Comment: RARE  . Drug Use: No  . Sexual Activity: Not on file   Other Topics Concern  . Not on file   Social History Narrative        Review of Systems  Constitutional: Negative for fever and chills.  Gastrointestinal: Negative for nausea, vomiting and diarrhea.  Genitourinary: Positive for frequency.  Musculoskeletal: Negative for back pain.       Objective:   Physical Exam  Constitutional: She is oriented to person, place, and time. She appears well-developed and well-nourished. No distress.  HENT:  Head: Normocephalic and atraumatic.  Right Ear: Hearing, tympanic membrane, external ear and ear canal normal.  Left Ear: Hearing, tympanic membrane, external ear and ear canal normal.  Nose: Nose normal.  Mouth/Throat: Oropharynx is clear and moist. No oropharyngeal exudate.  Eyes: Conjunctivae and EOM are normal. Pupils are equal, round, and reactive to light.  Cardiovascular: Normal rate, regular rhythm, normal heart sounds and intact distal pulses.   No murmur heard. Pulmonary/Chest: Effort normal and breath sounds normal. No respiratory distress. She has no wheezes. She has no rhonchi.  Abdominal: Soft. She exhibits no distension. There is no tenderness. There is no CVA tenderness.  Neurological: She is alert and oriented to person,  place, and time.  Skin: Skin is warm and dry. No rash noted.  Psychiatric: She has a normal mood and affect. Her behavior is normal.  Vitals reviewed.   Filed Vitals:   03/20/15 1405  BP: 128/80  Pulse: 89  Temp: 98.5 F (36.9 C)  TempSrc: Oral  Resp: 16  Height: _0  (1.727 m)  Weight: 184 lb (83.462 kg)  SpO2: 97%       Assessment & Plan:   Debbie Hayes is a 64 y.o. female Urine frequency - Plan: POCT urinalysis dipstick, POCT UA - Microscopic Only, cephALEXin (KEFLEX) 500 MG capsule  Acute hemorrhagic cystitis - Plan: cephALEXin (KEFLEX) 500 MG capsule, Urine culture  History of recurrent UTI, with acute hemorrhagic cystitis starting today. No concerning symptoms or exam findings. Will start Keflex 500 mg twice a day for 10 days, as previous culture was sensitive to this, and she has had good relief of symptoms when self treated 1 month ago. Urine culture pending. RTC precautions.  Meds ordered this encounter  Medications  . cephALEXin (KEFLEX) 500 MG capsule    Sig: Take 1 capsule (500 mg total) by mouth 2 (two) times daily.    Dispense:  20 capsule    Refill:  0   Patient Instructions  Start keflex. WE will let you know if the culture indicates change in antibiotic needed. Return to the clinic or go to the nearest emergency room if any of your symptoms worsen or new symptoms occur.  Urinary Tract Infection Urinary tract infections (UTIs) can develop anywhere along your urinary tract. Your urinary tract is your body's drainage system for removing wastes and extra water. Your urinary tract includes two kidneys, two ureters, a bladder, and a urethra. Your kidneys are a pair of bean-shaped organs. Each kidney is about the size of your fist. They are located below your ribs, one on each side of your spine. CAUSES Infections are caused by microbes, which are microscopic organisms, including fungi, viruses, and bacteria. These organisms are so small that they can only  be seen  through a microscope. Bacteria are the microbes that most commonly cause UTIs. SYMPTOMS  Symptoms of UTIs may vary by age and gender of the patient and by the location of the infection. Symptoms in young women typically include a frequent and intense urge to urinate and a painful, burning feeling in the bladder or urethra during urination. Older women and men are more likely to be tired, shaky, and weak and have muscle aches and abdominal pain. A fever may mean the infection is in your kidneys. Other symptoms of a kidney infection include pain in your back or sides below the ribs, nausea, and vomiting. DIAGNOSIS To diagnose a UTI, your caregiver will ask you about your symptoms. Your caregiver also will ask to provide a urine sample. The urine sample will be tested for bacteria and white blood cells. White blood cells are made by your body to help fight infection. TREATMENT  Typically, UTIs can be treated with medication. Because most UTIs are caused by a bacterial infection, they usually can be treated with the use of antibiotics. The choice of antibiotic and length of treatment depend on your symptoms and the type of bacteria causing your infection. HOME CARE INSTRUCTIONS  If you were prescribed antibiotics, take them exactly as your caregiver instructs you. Finish the medication even if you feel better after you have only taken some of the medication.  Drink enough water and fluids to keep your urine clear or pale yellow.  Avoid caffeine, tea, and carbonated beverages. They tend to irritate your bladder.  Empty your bladder often. Avoid holding urine for long periods of time.  Empty your bladder before and after sexual intercourse.  After a bowel movement, women should cleanse from front to back. Use each tissue only once. SEEK MEDICAL CARE IF:   You have back pain.  You develop a fever.  Your symptoms do not begin to resolve within 3 days. SEEK IMMEDIATE MEDICAL CARE IF:    You have severe back pain or lower abdominal pain.  You develop chills.  You have nausea or vomiting.  You have continued burning or discomfort with urination. MAKE SURE YOU:   Understand these instructions.  Will watch your condition.  Will get help right away if you are not doing well or get worse. Document Released: 04/23/2005 Document Revised: 01/13/2012 Document Reviewed: 08/22/2011 [facility] Patient Information 2015 [place], [place]. This information is not intended to replace advice given to you by your health care provider. Make sure you discuss any questions you have with your health care provider.     I personally performed the services described in this documentation, which was scribed in my presence. The recorded information has been reviewed and considered, and addended by me as needed.

## 2015-03-20 NOTE — Patient Instructions (Signed)
Start keflex. WE will let you know if the culture indicates change in antibiotic needed. Return to the clinic or go to the nearest emergency room if any of your symptoms worsen or new symptoms occur.  Urinary Tract Infection Urinary tract infections (UTIs) can develop anywhere along your urinary tract. Your urinary tract is your body's drainage system for removing wastes and extra water. Your urinary tract includes two kidneys, two ureters, a bladder, and a urethra. Your kidneys are a pair of bean-shaped organs. Each kidney is about the size of your fist. They are located below your ribs, one on each side of your spine. CAUSES Infections are caused by microbes, which are microscopic organisms, including fungi, viruses, and bacteria. These organisms are so small that they can only be seen through a microscope. Bacteria are the microbes that most commonly cause UTIs. SYMPTOMS  Symptoms of UTIs may vary by age and gender of the patient and by the location of the infection. Symptoms in young women typically include a frequent and intense urge to urinate and a painful, burning feeling in the bladder or urethra during urination. Older women and men are more likely to be tired, shaky, and weak and have muscle aches and abdominal pain. A fever may mean the infection is in your kidneys. Other symptoms of a kidney infection include pain in your back or sides below the ribs, nausea, and vomiting. DIAGNOSIS To diagnose a UTI, your caregiver will ask you about your symptoms. Your caregiver also will ask to provide a urine sample. The urine sample will be tested for bacteria and white blood cells. White blood cells are made by your body to help fight infection. TREATMENT  Typically, UTIs can be treated with medication. Because most UTIs are caused by a bacterial infection, they usually can be treated with the use of antibiotics. The choice of antibiotic and length of treatment depend on your symptoms and the type of  bacteria causing your infection. HOME CARE INSTRUCTIONS  If you were prescribed antibiotics, take them exactly as your caregiver instructs you. Finish the medication even if you feel better after you have only taken some of the medication.  Drink enough water and fluids to keep your urine clear or pale yellow.  Avoid caffeine, tea, and carbonated beverages. They tend to irritate your bladder.  Empty your bladder often. Avoid holding urine for long periods of time.  Empty your bladder before and after sexual intercourse.  After a bowel movement, women should cleanse from front to back. Use each tissue only once. SEEK MEDICAL CARE IF:   You have back pain.  You develop a fever.  Your symptoms do not begin to resolve within 3 days. SEEK IMMEDIATE MEDICAL CARE IF:   You have severe back pain or lower abdominal pain.  You develop chills.  You have nausea or vomiting.  You have continued burning or discomfort with urination. MAKE SURE YOU:   Understand these instructions.  Will watch your condition.  Will get help right away if you are not doing well or get worse. Document Released: 04/23/2005 Document Revised: 01/13/2012 Document Reviewed: 08/22/2011 [facility] Patient Information 2015 [place], [place]. This information is not intended to replace advice given to you by your health care provider. Make sure you discuss any questions you have with your health care provider.

## 2015-03-21 ENCOUNTER — Ambulatory Visit (INDEPENDENT_AMBULATORY_CARE_PROVIDER_SITE_OTHER): Payer: 59 | Admitting: Ophthalmology

## 2015-03-21 DIAGNOSIS — H3531 Nonexudative age-related macular degeneration: Secondary | ICD-10-CM | POA: Diagnosis not present

## 2015-03-21 DIAGNOSIS — H35033 Hypertensive retinopathy, bilateral: Secondary | ICD-10-CM | POA: Diagnosis not present

## 2015-03-21 DIAGNOSIS — E11329 Type 2 diabetes mellitus with mild nonproliferative diabetic retinopathy without macular edema: Secondary | ICD-10-CM

## 2015-03-21 DIAGNOSIS — E10319 Type 1 diabetes mellitus with unspecified diabetic retinopathy without macular edema: Secondary | ICD-10-CM

## 2015-03-21 DIAGNOSIS — H43813 Vitreous degeneration, bilateral: Secondary | ICD-10-CM

## 2015-03-21 DIAGNOSIS — I10 Essential (primary) hypertension: Secondary | ICD-10-CM | POA: Diagnosis not present

## 2015-03-22 LAB — URINE CULTURE

## 2015-04-23 ENCOUNTER — Other Ambulatory Visit: Payer: Self-pay | Admitting: Emergency Medicine

## 2015-04-24 ENCOUNTER — Telehealth: Payer: Self-pay

## 2015-04-24 NOTE — Telephone Encounter (Signed)
Dr Everlene Farrier, pharm called to get clarification as to how often pt can take skelaxon as needed. Sig doesn't say, just take 1 tablet as needed. I checked hx in EPIC and it has ALWAYS had this sig, and first time Rxd, note says "refilled per pt req". Do you want me to tell them once daily, or TID, etc?

## 2015-04-25 MED ORDER — METAXALONE 800 MG PO TABS: 800.0000 mg | ORAL_TABLET | Freq: Three times a day (TID) | ORAL | Status: DC

## 2015-04-25 NOTE — Telephone Encounter (Signed)
Put an order that states patient can take 1 every 8 hours as needed for muscle spasm

## 2015-04-25 NOTE — Telephone Encounter (Signed)
Order placed

## 2015-05-01 ENCOUNTER — Encounter: Payer: Self-pay | Admitting: Emergency Medicine

## 2015-05-01 ENCOUNTER — Ambulatory Visit (INDEPENDENT_AMBULATORY_CARE_PROVIDER_SITE_OTHER): Payer: 59 | Admitting: Emergency Medicine

## 2015-05-01 VITALS — BP 107/69 | HR 78 | Temp 98.4°F | Resp 16 | Ht 68.0 in | Wt 181.0 lb

## 2015-05-01 DIAGNOSIS — Z23 Encounter for immunization: Secondary | ICD-10-CM

## 2015-05-01 DIAGNOSIS — Z1231 Encounter for screening mammogram for malignant neoplasm of breast: Secondary | ICD-10-CM | POA: Diagnosis not present

## 2015-05-01 DIAGNOSIS — E088 Diabetes mellitus due to underlying condition with unspecified complications: Secondary | ICD-10-CM

## 2015-05-01 DIAGNOSIS — N3001 Acute cystitis with hematuria: Secondary | ICD-10-CM

## 2015-05-01 DIAGNOSIS — Z0000 Encounter for general adult medical examination without abnormal findings: Secondary | ICD-10-CM | POA: Diagnosis not present

## 2015-05-01 DIAGNOSIS — E785 Hyperlipidemia, unspecified: Secondary | ICD-10-CM | POA: Diagnosis not present

## 2015-05-01 DIAGNOSIS — Z1159 Encounter for screening for other viral diseases: Secondary | ICD-10-CM | POA: Diagnosis not present

## 2015-05-01 DIAGNOSIS — Z85850 Personal history of malignant neoplasm of thyroid: Secondary | ICD-10-CM | POA: Diagnosis not present

## 2015-05-01 DIAGNOSIS — Z13820 Encounter for screening for osteoporosis: Secondary | ICD-10-CM

## 2015-05-01 DIAGNOSIS — R350 Frequency of micturition: Secondary | ICD-10-CM | POA: Diagnosis not present

## 2015-05-01 LAB — POCT URINALYSIS DIP (MANUAL ENTRY)
BILIRUBIN UA: NEGATIVE
Glucose, UA: NEGATIVE
Ketones, POC UA: NEGATIVE
LEUKOCYTES UA: NEGATIVE
Nitrite, UA: NEGATIVE
PH UA: 6
Protein Ur, POC: NEGATIVE
SPEC GRAV UA: 1.015
Urobilinogen, UA: 0.2

## 2015-05-01 LAB — COMPLETE METABOLIC PANEL WITH GFR
ALT: 16 U/L (ref 6–29)
AST: 18 U/L (ref 10–35)
Albumin: 4.5 g/dL (ref 3.6–5.1)
Alkaline Phosphatase: 63 U/L (ref 33–130)
BILIRUBIN TOTAL: 0.4 mg/dL (ref 0.2–1.2)
BUN: 16 mg/dL (ref 7–25)
CALCIUM: 9.6 mg/dL (ref 8.6–10.4)
CHLORIDE: 102 mmol/L (ref 98–110)
CO2: 28 mmol/L (ref 20–31)
CREATININE: 0.58 mg/dL (ref 0.50–0.99)
Glucose, Bld: 130 mg/dL — ABNORMAL HIGH (ref 65–99)
Potassium: 4.3 mmol/L (ref 3.5–5.3)
Sodium: 139 mmol/L (ref 135–146)
Total Protein: 7.1 g/dL (ref 6.1–8.1)

## 2015-05-01 LAB — LIPID PANEL
CHOLESTEROL: 261 mg/dL — AB (ref 125–200)
HDL: 61 mg/dL (ref >=46)
LDL Cholesterol: 172 mg/dL — ABNORMAL HIGH (ref <130)
TRIGLYCERIDES: 142 mg/dL (ref <150)
Total CHOL/HDL Ratio: 4.3 Ratio (ref <=5.0)
VLDL: 28 mg/dL (ref <30)

## 2015-05-01 LAB — HEPATITIS C ANTIBODY: HCV Ab: NEGATIVE

## 2015-05-01 LAB — TSH: TSH: 0.849 u[IU]/mL (ref 0.350–4.500)

## 2015-05-01 LAB — GLUCOSE, POCT (MANUAL RESULT ENTRY): POC GLUCOSE: 120 mg/dL — AB (ref 70–99)

## 2015-05-01 LAB — POCT GLYCOSYLATED HEMOGLOBIN (HGB A1C): HEMOGLOBIN A1C: 6.7

## 2015-05-01 MED ORDER — METFORMIN HCL 1000 MG PO TABS: ORAL_TABLET | ORAL | Status: DC

## 2015-05-01 MED ORDER — CEPHALEXIN 500 MG PO CAPS: 500.0000 mg | ORAL_CAPSULE | Freq: Two times a day (BID) | ORAL | Status: DC

## 2015-05-01 NOTE — Progress Notes (Addendum)
This chart was scribed for Debbie Queen, MD by Moises Blood, Medical Scribe. This patient was seen in Room 22 and the patient's care was started 8:30 AM.  Chief Complaint:  Chief Complaint  Patient presents with  . Annual Exam  . Flu Vaccine    HPI: Debbie Hayes is a 64 y.o. female who reports to [facility] today for annual exam.  Cancer Screening She is due for a bone density.  She had a colonoscopy 2 months ago.  Her last mammogram was 05/19/14.  She saw her eye doctor recently.  She sees Dr. Chalmers Cater for endocrinologist. She will see for 2.5 more years.   Immunizations She is due for pneumovax. She received prevnar in Spring (april).  She received a flu shot today.   DM She takes metformin and it hurts her stomach. She denies seeing orthopedist.   Family She has a daughter with down syndrome.   Past Medical History  Diagnosis Date  . Rotator cuff disorder LEFT SHOULDER RTC IMPINGMENT  . Hyperlipidemia   . Normal nuclear stress test 01-25-2009  . Disorder of inner ear CAUSES VERTIGO OCCASIONALLY  . Chronic migraine BOTOX INJECTION EVERY 3 MONTHS  . Fibromyalgia   . Hemorrhoid   . Constipation   . Spondylolisthesis of lumbar region   . GERD (gastroesophageal reflux disease)   . Hypothyroidism   . History of thyroid cancer 2009  S/P TOTAL THYROIDECTOMY AND RADIATION  . Diabetes mellitus   . 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 Insomnia   . PONV (postoperative nausea and vomiting) SEVERE  . Chronic pain in right shoulder   . Left shoulder pain   . OA (osteoarthritis) JOINTS  . Hypertension CARDIOLOGIST- DR Wynonia Lawman- WILL REQUEST LATE NOTE    DENIES S & S  . Allergy   . Cataract   . History of bladder infections   . Wears glasses   . Macular degeneration    Past Surgical History  Procedure Laterality Date  . Right knee closed manipulation  09-11-2010  . Total knee arthroplasty  07-16-2010    RIGHT  . Right knee arthroscopy/ partial lateral  menisectomy/ tricompartment chondroplasty/ decompression cyst  10-26-2009  . Right knee arthroscopy  X2  BEFORE 2011  . Total thyroidectomy  2009    CANCER  (POST-OP BLEED)  AND RADIATION TX  . Left thumb joint replacement  2005    RIGHT DONE IN 2004  . Right shoulder arthroscopy  2010  &  2004  . Bilateral elbow surg.  1999  . Bilateral carpal tunnel release  1994  . Vaginal hysterectomy  1990  . Bilateral salpingoophectomy  1993    POST-OP URETER REPAIR 12 DAYS AFTER   . Tonsillectomy  1968  . Tibia cyst removed and orif leg fx  1958  . Trigger fingers Right 2013    3rd and 4th fingers   Social History   Social History  . Marital Status: Married    Spouse Name: N/A  . Number of Children: N/A  . Years of Education: N/A   Social History Main Topics  . Smoking status: Never Smoker   . Smokeless tobacco: Never Used  . Alcohol Use: Yes     Comment: RARE  . Drug Use: No  . Sexual Activity: Not Asked   Other Topics Concern  . None   Social History Narrative   Family History  Problem Relation Age of Onset  . Colon cancer Neg Hx   .  Colon polyps Sister 22  . Alzheimer's disease Mother   . Heart disease Father   . Cirrhosis Father   . Heart attack Father   . Macular degeneration Father    Allergies  Allergen Reactions  . Other Other (See Comments)    Artificial sweetners  . Sulfa Antibiotics Hives   Prior to Admission medications   Medication Sig Start Date End Date Taking? Authorizing Provider  aspirin 81 MG tablet Take 81 mg by mouth daily.    Historical Provider, MD  azelastine (ASTELIN) 137 MCG/SPRAY nasal spray Place 2 sprays into both nostrils 2 (two) times daily. Use in each nostril as directed 10/14/13   [name], MD  benzonatate (TESSALON) 100 MG capsule Take 1-2 capsules (100-200 mg total) by mouth 3 (three) times daily as needed for cough. 01/03/15   [name], MD  blood glucose meter kit and supplies KIT Test blood sugar once daily. Dx code: E11.9  03/08/15   [name], MD  cephALEXin (KEFLEX) 500 MG capsule Take 1 capsule (500 mg total) by mouth 2 (two) times daily. 03/20/15   Wendie Agreste, MD  cetirizine (ZYRTEC) 10 MG tablet Take 10 mg by mouth daily as needed for allergies.     Historical Provider, MD  glucose blood test strip Test blood sugar once daily. Dx code: E11.9 03/08/15   [name], MD  Lancets MISC Test blood sugar once daily. Dx code: E11.9 03/08/15   [name], MD  levothyroxine (SYNTHROID, LEVOTHROID) 137 MCG tablet Take 137 mcg by mouth daily before breakfast.    Historical Provider, MD  lidocaine (LIDODERM) 5 % APPLY 1 PATCH FOR 12 HOURS OUT OF 24 HOURS 01/17/14   Mancel Bale, PA-C  losartan (COZAAR) 50 MG tablet TAKE 1 TABLET BY MOUTH ONCE DAILY 02/06/15   [name], MD  metaxalone (SKELAXIN) 800 MG tablet Take 1 tablet (800 mg total) by mouth 3 (three) times daily. 04/25/15   [name], MD  metFORMIN (GLUCOPHAGE) 1000 MG tablet TAKE 1 TABLET BY MOUTH 2 TIMES DAILY WITH A MEAL. 02/06/15   [name], MD  metoprolol succinate (TOPROL-XL) 25 MG 24 hr tablet Take 12.5 mg by mouth every morning.    Historical Provider, MD  mometasone [facility]) 220 MCG/INH inhaler Inhale 2 puffs into the lungs daily. Patient not taking: Reported on 03/20/2015 10/14/13   [name], MD  Multiple Vitamins-Minerals (PRESERVISION AREDS 2 PO) Take by mouth 2 (two) times daily.    Historical Provider, MD  NASONEX 50 MCG/ACT nasal spray PLACE 2 SPRAYS INTO THE NOSE DAILY. 09/20/14   [name], MD  ondansetron (ZOFRAN-ODT) 4 MG disintegrating tablet DISSOLVE 1 TABLET BY MOUTH EVERY 8 HOURS AS NEEDED FOR NAUSEA. 12/25/14   Shawnee Knapp, MD  phenazopyridine (PYRIDIUM) 200 MG tablet Take 1 tablet (200 mg total) by mouth 3 (three) times daily as needed for pain. 06/28/14   Chelle Jeffery, PA-C  Probiotic Product (PRO-BIOTIC BLEND) CAPS Take by mouth.    Historical Provider, MD  traZODone (DESYREL) 100 MG tablet TAKE 1-1/2-2 TABLETS BY  MOUTH AT BEDTIME 02/06/15   [name], MD  trimethoprim (TRIMPEX) 100 MG tablet Take 1 tablet (100 mg total) by mouth daily. 08/04/14   [name], MD  zolpidem (AMBIEN) 5 MG tablet TAKE 1 TABLET BY MOUTH AT BEDTIME AS NEEDED FOR SLEEP 04/24/15   [name], MD     ROS:  Constitutional: negative for chills, fever, night sweats, weight changes, or fatigue  HEENT: negative for vision changes, hearing loss, congestion, rhinorrhea, ST, epistaxis, or sinus pressure Cardiovascular: negative for chest pain or palpitations Respiratory: negative for hemoptysis, wheezing, shortness of breath, or cough Abdominal: negative for abdominal pain, nausea, vomiting, diarrhea, or constipation Dermatological: negative for rash Neurologic: negative for headache, dizziness, or syncope All other systems reviewed and are otherwise negative with the exception to those above and in the HPI.  PHYSICAL EXAM: Filed Vitals:   05/01/15 0821  BP: 107/69  Pulse: 78  Temp: 98.4 F (36.9 C)  Resp: 16   Body mass index is 27.53 kg/(m^2).   General: Alert, no acute distress HEENT:  Normocephalic, atraumatic, oropharynx patent. Eye: Juliette Mangle [facility] Cardiovascular:  Regular rate and rhythm, no rubs murmurs or gallops.  No Carotid bruits, radial pulse intact. No pedal edema.  Respiratory: Clear to auscultation bilaterally.  No wheezes, rales, or rhonchi.  No cyanosis, no use of accessory musculature Abdominal: No organomegaly, abdomen is soft and non-tender, positive bowel sounds. No masses. Small umbilical hernia Musculoskeletal: Gait intact. No edema, tenderness Skin: No rashes. Neurologic: Facial musculature symmetric. Psychiatric: Patient acts appropriately throughout our interaction.  Lymphatic: No cervical or submandibular lymphadenopathy Genitourinary/Anorectal: No acute findings   LABS: Results for orders placed or performed in visit on 05/01/15  POCT glucose (manual entry)  Result Value Ref Range     POC Glucose 120 (A) 70 - 99 mg/dl  POCT glycosylated hemoglobin (Hb A1C)  Result Value Ref Range   Hemoglobin A1C 6.7   POCT urinalysis dipstick  Result Value Ref Range   Color, UA light yellow (A) yellow   Clarity, UA clear clear   Glucose, UA negative negative   Bilirubin, UA negative negative   Ketones, POC UA negative negative   Spec Grav, UA 1.015    Blood, UA trace-lysed (A) negative   pH, UA 6.0    Protein Ur, POC negative negative   Urobilinogen, UA 0.2    Nitrite, UA Negative Negative   Leukocytes, UA Negative Negative     EKG/XRAY:   Primary read interpreted by Dr. Everlene Farrier at [facility].   ASSESSMENT/PLAN: No change in medication at the present time. She was updated on her pneumonia 23 vaccine and given her flu shot. She continues to work on diet and weight loss. Overall she's doing very well. GYN exam was not performed today her husband who is a physician will check the external genitalia for any lesions. Otherwise will have one of the PAs or nurse practitioners check in about your months. She has had a hysterectomy and bilateral salpingo-oophorectomy.  By signing my name below, I, Moises Blood, attest that this documentation has been prepared under the direction and in the presence of Debbie Queen, MD. Electronically Signed: Moises Blood, [place]. 05/01/2015 , 8:30 AM .  I personally performed the services described in this documentation, which was scribed in my presence. The recorded information has been reviewed and is accurate.  Gross sideeffects, risk and benefits, and alternatives of medications d/w patient. Patient is aware that all medications have potential sideeffects and we are unable to predict every sideeffect or drug-drug interaction that may occur.  Debbie Queen MD 05/01/2015 8:30 AM

## 2015-05-02 LAB — VITAMIN D 25 HYDROXY (VIT D DEFICIENCY, FRACTURES): VIT D 25 HYDROXY: 34 ng/mL (ref 30–100)

## 2015-05-03 ENCOUNTER — Encounter: Payer: 59 | Admitting: Emergency Medicine

## 2015-05-15 ENCOUNTER — Other Ambulatory Visit: Payer: Self-pay

## 2015-05-15 NOTE — Telephone Encounter (Signed)
Pharm sent fax req for Dr Everlene Farrier to write Rx for trimethoprim 100 mg, 1 tab QD, per pt request. This was previously Rxd by [facility], but pt would like Dr Everlene Farrier to manage now. I will pend for review.

## 2015-05-16 MED ORDER — TRIMETHOPRIM 100 MG PO TABS: 100.0000 mg | ORAL_TABLET | Freq: Every day | ORAL | Status: DC

## 2015-05-31 ENCOUNTER — Ambulatory Visit (INDEPENDENT_AMBULATORY_CARE_PROVIDER_SITE_OTHER): Payer: 59 | Admitting: Family Medicine

## 2015-05-31 VITALS — BP 128/76 | HR 82 | Temp 98.3°F | Resp 16

## 2015-05-31 DIAGNOSIS — R350 Frequency of micturition: Secondary | ICD-10-CM | POA: Diagnosis not present

## 2015-05-31 LAB — POCT URINALYSIS DIP (MANUAL ENTRY)
BILIRUBIN UA: NEGATIVE
GLUCOSE UA: NEGATIVE
NITRITE UA: NEGATIVE
PH UA: 5.5
Protein Ur, POC: NEGATIVE
RBC UA: NEGATIVE
Spec Grav, UA: 1.015
Urobilinogen, UA: 0.2

## 2015-05-31 LAB — POC MICROSCOPIC URINALYSIS (UMFC)

## 2015-05-31 NOTE — Patient Instructions (Signed)

## 2015-05-31 NOTE — Progress Notes (Signed)
Subjective:  This chart was scribed for Elvina Sidle, MD by Broadus John, Medical Scribe. This patient was seen in Room 8 and the patient's care was started at 5:01 PM.   Patient ID: Debbie Hayes, female    DOB: March 02, 1951, 64 y.o.   MRN: [id number]  Chief Complaint  Patient presents with   Urinary Tract Infection    X 2 days    HPI HPI Comments: Debbie Hayes is a 64 y.o. female with a recurrent hx of UTI's who presents to [facility] complaining of a possible UTI, onset 2 days ago.  Pt notes that she has test strips at home that she used and tested positive for UTI. Pt has symptoms of urinary frequency. Pt denies any other sick symptoms such as fever. Pt notes having previous history of UTI that she was treated successful with Keflex.    Patient Active Problem List   Diagnosis Date Noted   Spondylolisthesis of lumbar region 08/09/2014   Back pain 02/17/2014   Macular degeneration 02/17/2014   Other and unspecified hyperlipidemia 10/14/2013   Onychomycosis 12/01/2012   Diabetes mellitus (HCC) 04/12/2012   Arthritis 04/12/2012   Hypothyroid 04/12/2012   Past Medical History  Diagnosis Date   Rotator cuff disorder LEFT SHOULDER RTC IMPINGMENT   Hyperlipidemia    Normal nuclear stress test 01-25-2009   Disorder of inner ear CAUSES VERTIGO OCCASIONALLY   Chronic migraine BOTOX INJECTION EVERY 3 MONTHS   Fibromyalgia    Hemorrhoid    Constipation    Spondylolisthesis of lumbar region    GERD (gastroesophageal reflux disease)    Hypothyroidism    History of thyroid cancer 2009  S/P TOTAL THYROIDECTOMY AND RADIATION   Diabetes mellitus    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   Insomnia    PONV (postoperative nausea and vomiting) SEVERE   Chronic pain in right shoulder    Left shoulder pain    OA (osteoarthritis) JOINTS   Hypertension CARDIOLOGIST- DR Donnie Aho- WILL REQUEST LATE NOTE    DENIES S & S    Allergy    Cataract    History of bladder infections    Wears glasses    Macular degeneration    Past Surgical History  Procedure Laterality Date   Right knee closed manipulation  09-11-2010   Total knee arthroplasty  07-16-2010    RIGHT   Right knee arthroscopy/ partial lateral menisectomy/ tricompartment chondroplasty/ decompression cyst  10-26-2009   Right knee arthroscopy  X2  BEFORE 2011   Total thyroidectomy  2009    CANCER  (POST-OP BLEED)  AND RADIATION TX   Left thumb joint replacement  2005    RIGHT DONE IN 2004   Right shoulder arthroscopy  2010  &  2004   Bilateral elbow surg.  1999   Bilateral carpal tunnel release  1994   Vaginal hysterectomy  1990   Bilateral salpingoophectomy  1993    POST-OP URETER REPAIR 12 DAYS AFTER    Tonsillectomy  1968   Tibia cyst removed and orif leg fx  1958   Trigger fingers Right 2013    3rd and 4th fingers   Allergies  Allergen Reactions   Other Other (See Comments)    Artificial sweetners   Sulfa Antibiotics Hives   Prior to Admission medications   Medication Sig Start Date End Date Taking? Authorizing Provider  aspirin 81 MG tablet Take 81 mg by mouth daily.   Yes Historical Provider,  MD  azelastine (ASTELIN) 137 MCG/SPRAY nasal spray Place 2 sprays into both nostrils 2 (two) times daily. Use in each nostril as directed 10/14/13  Yes [name], MD  blood glucose meter kit and supplies KIT Test blood sugar once daily. Dx code: E11.9 03/08/15  Yes [name], MD  cephALEXin (KEFLEX) 500 MG capsule Take 1 capsule (500 mg total) by mouth 2 (two) times daily. 05/01/15  Yes [name], MD  cetirizine (ZYRTEC) 10 MG tablet Take 10 mg by mouth daily as needed for allergies.    Yes Historical Provider, MD  glucose blood test strip Test blood sugar once daily. Dx code: E11.9 03/08/15  Yes [name], MD  Lancets MISC Test blood sugar once daily. Dx code: E11.9 03/08/15  Yes [name], MD  levothyroxine  (SYNTHROID, LEVOTHROID) 137 MCG tablet Take 137 mcg by mouth daily before breakfast.   Yes Historical Provider, MD  lidocaine (LIDODERM) 5 % APPLY 1 PATCH FOR 12 HOURS OUT OF 24 HOURS 01/17/14  Yes Mancel Bale, PA-C  losartan (COZAAR) 50 MG tablet TAKE 1 TABLET BY MOUTH ONCE DAILY 02/06/15  Yes [name], MD  metaxalone (SKELAXIN) 800 MG tablet Take 1 tablet (800 mg total) by mouth 3 (three) times daily. 04/25/15  Yes [name], MD  metFORMIN (GLUCOPHAGE) 1000 MG tablet TAKE 1 TABLET BY MOUTH 2 TIMES DAILY WITH A MEAL. 05/01/15  Yes [name], MD  metoprolol succinate (TOPROL-XL) 25 MG 24 hr tablet Take 12.5 mg by mouth every morning.   Yes Historical Provider, MD  Multiple Vitamins-Minerals (PRESERVISION AREDS 2 PO) Take by mouth 2 (two) times daily.   Yes Historical Provider, MD  NASONEX 50 MCG/ACT nasal spray PLACE 2 SPRAYS INTO THE NOSE DAILY. 09/20/14  Yes [name], MD  ondansetron (ZOFRAN-ODT) 4 MG disintegrating tablet DISSOLVE 1 TABLET BY MOUTH EVERY 8 HOURS AS NEEDED FOR NAUSEA. 12/25/14  Yes Shawnee Knapp, MD  phenazopyridine (PYRIDIUM) 200 MG tablet Take 1 tablet (200 mg total) by mouth 3 (three) times daily as needed for pain. 06/28/14  Yes Chelle Jeffery, PA-C  Probiotic Product (PRO-BIOTIC BLEND) CAPS Take by mouth.   Yes Historical Provider, MD  traZODone (DESYREL) 100 MG tablet TAKE 1-1/2-2 TABLETS BY MOUTH AT BEDTIME 02/06/15  Yes [name], MD  trimethoprim (TRIMPEX) 100 MG tablet Take 1 tablet (100 mg total) by mouth daily. 05/16/15  Yes [name], MD  zolpidem (AMBIEN) 5 MG tablet TAKE 1 TABLET BY MOUTH AT BEDTIME AS NEEDED FOR SLEEP 04/24/15  Yes [name], MD  mometasone [facility]) 220 MCG/INH inhaler Inhale 2 puffs into the lungs daily. Patient not taking: Reported on 03/20/2015 10/14/13   [name], MD   Social History   Social History   Marital Status: Married    Spouse Name: N/A   Number of Children: N/A   Years of Education: N/A    Occupational History   Not on file.   Social History Main Topics   Smoking status: Never Smoker    Smokeless tobacco: Never Used   Alcohol Use: Yes     Comment: RARE   Drug Use: No   Sexual Activity: Not on file   Other Topics Concern   Not on file   Social History Narrative    Review of Systems  Constitutional: Negative for fever.  Genitourinary: Positive for frequency.      Objective:   Physical Exam  Constitutional: She is oriented to person, place, and time. She appears well-developed and well-nourished. No distress.  HENT:  Head: Normocephalic and atraumatic.  Eyes: EOM are normal. Pupils are equal, round, and reactive to light.  Neck: Neck supple.  Cardiovascular: Normal rate.   Pulmonary/Chest: Effort normal.  Neurological: She is alert and oriented to person, place, and time. No cranial nerve deficit.  Skin: Skin is warm and dry.  Psychiatric: She has a normal mood and affect. Her behavior is normal.  Nursing note and vitals reviewed.   BP 128/76 mmHg   Pulse 82   Temp(Src) 98.3 F (36.8 C) (Oral)   Resp 16   SpO2 98%  Results for orders placed or performed in visit on 05/31/15  POCT Microscopic Urinalysis ([facility])  Result Value Ref Range   WBC,UR,HPF,POC Few (A) None WBC/hpf   RBC,UR,HPF,POC None None RBC/hpf   Bacteria None None, Too numerous to count   Mucus Present (A) Absent   Epithelial Cells, UR Per Microscopy Few (A) None, Too numerous to count cells/hpf  POCT urinalysis dipstick  Result Value Ref Range   Color, UA yellow yellow   Clarity, UA hazy (A) clear   Glucose, UA negative negative   Bilirubin, UA negative negative   Ketones, POC UA trace (5) (A) negative   Spec Grav, UA 1.015    Blood, UA negative negative   pH, UA 5.5    Protein Ur, POC negative negative   Urobilinogen, UA 0.2    Nitrite, UA Negative Negative   Leukocytes, UA small (1+) (A) Negative       Assessment & Plan:     By signing my name below, I, Rawaa Al  Rifaie, attest that this documentation has been prepared under the direction and in the presence of Robyn Haber, MD.  Leandra Kern, Medical Scribe. 05/31/2015.  5:06 PM.  This chart was scribed in my presence and reviewed by me personally.    ICD-9-CM ICD-10-CM   1. Increased frequency of urination 788.41 R35.0 POCT Microscopic Urinalysis ([facility])     POCT urinalysis dipstick     Urine culture   Start Keflex  Signed, Robyn Haber, MD

## 2015-06-02 LAB — URINE CULTURE: Colony Count: >=100000

## 2015-06-04 ENCOUNTER — Other Ambulatory Visit: Payer: Self-pay | Admitting: Family Medicine

## 2015-06-04 MED ORDER — AMOXICILLIN 500 MG PO CAPS: 500.0000 mg | ORAL_CAPSULE | Freq: Three times a day (TID) | ORAL | Status: DC

## 2015-06-04 NOTE — Telephone Encounter (Signed)
Per Dr. Joseph Art send in Amox 500mg  1 po tid #30 0 refills.  Rx sent to [facility], as patient requested

## 2015-06-27 ENCOUNTER — Other Ambulatory Visit: Payer: Self-pay | Admitting: Family Medicine

## 2015-07-03 ENCOUNTER — Encounter: Payer: Self-pay | Admitting: Physical Therapy

## 2015-07-03 ENCOUNTER — Ambulatory Visit: Payer: 59 | Attending: Specialist | Admitting: Physical Therapy

## 2015-07-03 ENCOUNTER — Ambulatory Visit (INDEPENDENT_AMBULATORY_CARE_PROVIDER_SITE_OTHER): Payer: 59 | Admitting: Family Medicine

## 2015-07-03 VITALS — BP 126/74 | HR 80 | Temp 98.4°F | Resp 16 | Ht 68.0 in | Wt 187.4 lb

## 2015-07-03 DIAGNOSIS — M25561 Pain in right knee: Secondary | ICD-10-CM | POA: Diagnosis not present

## 2015-07-03 DIAGNOSIS — R350 Frequency of micturition: Secondary | ICD-10-CM

## 2015-07-03 DIAGNOSIS — N3000 Acute cystitis without hematuria: Secondary | ICD-10-CM

## 2015-07-03 DIAGNOSIS — R262 Difficulty in walking, not elsewhere classified: Secondary | ICD-10-CM | POA: Diagnosis present

## 2015-07-03 LAB — POCT URINALYSIS DIP (MANUAL ENTRY)
BILIRUBIN UA: NEGATIVE
Glucose, UA: NEGATIVE
Ketones, POC UA: NEGATIVE
NITRITE UA: NEGATIVE
PH UA: 6
PROTEIN UA: NEGATIVE
Spec Grav, UA: 1.01
Urobilinogen, UA: 0.2

## 2015-07-03 LAB — POC MICROSCOPIC URINALYSIS (UMFC): Mucus: ABSENT

## 2015-07-03 NOTE — Therapy (Signed)
[address] [place], [place], [address] Phone: [phone]   Fax:  [phone]  Physical Therapy Evaluation  Patient Details  Name: Debbie Hayes MRN: [id number] Date of Birth: 06/09/1951 Referring Provider: Margit Banda  Encounter Date: 07/03/2015      PT End of Session - 07/03/15 1507    Visit Number 1   Date for PT Re-Evaluation 09/03/15   PT Start Time 1423   PT Stop Time 1518   PT Time Calculation (min) 55 min   Activity Tolerance Patient tolerated treatment well   Behavior During Therapy [facility] for tasks assessed/performed      Past Medical History  Diagnosis Date  . Rotator cuff disorder LEFT SHOULDER RTC IMPINGMENT  . Hyperlipidemia   . Normal nuclear stress test 01-25-2009  . Disorder of inner ear CAUSES VERTIGO OCCASIONALLY  . Chronic migraine BOTOX INJECTION EVERY 3 MONTHS  . Fibromyalgia   . Hemorrhoid   . Constipation   . Spondylolisthesis of lumbar region   . GERD (gastroesophageal reflux disease)   . Hypothyroidism   . History of thyroid cancer 2009  S/P TOTAL THYROIDECTOMY AND RADIATION  . Diabetes mellitus   . 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 Insomnia   . PONV (postoperative nausea and vomiting) SEVERE  . Chronic pain in right shoulder   . Left shoulder pain   . OA (osteoarthritis) JOINTS  . Hypertension CARDIOLOGIST- DR Wynonia Lawman- WILL REQUEST LATE NOTE    DENIES S & S  . Allergy   . Cataract   . History of bladder infections   . Wears glasses   . Macular degeneration     Past Surgical History  Procedure Laterality Date  . Right knee closed manipulation  09-11-2010  . Total knee arthroplasty  07-16-2010    RIGHT  . Right knee arthroscopy/ partial lateral menisectomy/ tricompartment chondroplasty/ decompression cyst  10-26-2009  . Right knee arthroscopy  X2  BEFORE 2011  . Total thyroidectomy  2009    CANCER  (POST-OP BLEED)  AND RADIATION TX  . Left thumb  joint replacement  2005    RIGHT DONE IN 2004  . Right shoulder arthroscopy  2010  &  2004  . Bilateral elbow surg.  1999  . Bilateral carpal tunnel release  1994  . Vaginal hysterectomy  1990  . Bilateral salpingoophectomy  1993    POST-OP URETER REPAIR 12 DAYS AFTER   . Tonsillectomy  1968  . Tibia cyst removed and orif leg fx  1958  . Trigger fingers Right 2013    3rd and 4th fingers    There were no vitals filed for this visit.  Visit Diagnosis:  Right knee pain - Plan: PT plan of care cert/re-cert  Difficulty walking - Plan: PT plan of care cert/re-cert      Subjective Assessment - 07/03/15 1443    Subjective Patient had a right TKR 5 years ago.  Reports that she has been having pain in the right knee about 2 months now.  Reports that she was having difficulty walking   Limitations Standing;Walking   Patient Stated Goals have less pain with walking   Currently in Pain? Yes   Pain Score 3    Pain Location Knee   Pain Orientation Left   Pain Descriptors / Indicators Aching;Jabbing   Pain Type Acute pain   Pain Onset More than a month ago   Pain Frequency Intermittent   Aggravating Factors  walking and stairs pain up to 7/10   Pain Relieving Factors rest and pain meds pain can be 1/10   Effect of Pain on Daily Activities limits walking            [facility] PT Assessment - 07/03/15 0001    Assessment   Medical Diagnosis right pes anserinus bursitis   Referring Provider [place]   Onset Date/Surgical Date 06/19/15   Prior Therapy no   Precautions   Precaution Comments migraines   Balance Screen   Has the patient fallen in the past 6 months No   Has the patient had a decrease in activity level because of a fear of falling?  No   Is the patient reluctant to leave their home because of a fear of falling?  No   Home Environment   Additional Comments has stairs at home, does travel a lot, does housework   Prior Function   Level of Independence Independent   Vocation  Part time employment   Leisure travels, walks 4-5 days a week for about 30 minutes   AROM   Overall AROM Comments AROM 4-103 degrees flexion   Strength   Overall Strength Comments 4-/5   Flexibility   Soft Tissue Assessment /Muscle Length --  mild calf tightness   Palpation   Palpation comment tender medial    Ambulation/Gait   Gait Comments mild antalgic gait on the right                   [facility] Adult PT Treatment/Exercise - 07/03/15 0001    Modalities   Modalities Ultrasound;Iontophoresis   Ultrasound   Ultrasound Location right medial knee   Ultrasound Parameters 100% 3MHz 1.2 w/xm2   Ultrasound Goals Pain   Iontophoresis   Type of Iontophoresis Dexamethasone   Location right medial knee   Dose 45mA   Time 4 hour patch                  PT Short Term Goals - 07/03/15 1510    PT SHORT TERM GOAL #1   Title independent with initial HEP   Time 1   Period Weeks   Status New           PT Long Term Goals - 07/03/15 1510    PT LONG TERM GOAL #1   Title independent with proper posture and body mechanics   Time 8   Period Weeks   Status New   PT LONG TERM GOAL #2   Title increase right knee ROM to 0-115 degrees flexion   Time 8   Period Weeks   Status New   PT LONG TERM GOAL #3   Title reports 25% easier to walk int he neighbor hood   Time 8   Period Weeks   Status New   PT LONG TERM GOAL #4   Title decrease pain 25%   Time 8   Period Weeks   Status New               Plan - 07/03/15 1508    Clinical Impression Statement Patient with right knee pain for about 2 years.  She reports that it has limited her ability to walk.  Reports that rest has helped but will need to travel often.  Her ROM was 5-105 degrees flexion   Pt will benefit from skilled therapeutic intervention in order to improve on the following deficits Abnormal gait;Decreased range of motion;Decreased strength;Difficulty walking;Impaired flexibility;Pain   Rehab  Potential Good   PT Frequency 2x / week   PT Duration 8 weeks   PT Treatment/Interventions ADLs/Self Care Home Management;Electrical Stimulation;Cryotherapy;Iontophoresis 4mg /ml Dexamethasone;Moist Heat;Therapeutic exercise;Functional mobility training;Gait training;Ultrasound;Balance training;Patient/family education;Manual techniques   PT Next Visit Plan address with pain and inflammation as she will travel internationally in January   Consulted and Agree with Plan of Care Patient         Problem List Patient Active Problem List   Diagnosis Date Noted  . Spondylolisthesis of lumbar region 08/09/2014  . Back pain 02/17/2014  . Macular degeneration 02/17/2014  . Other and unspecified hyperlipidemia 10/14/2013  . Onychomycosis 12/01/2012  . Diabetes mellitus ([place]) 04/12/2012  . Arthritis 04/12/2012  . Hypothyroid 04/12/2012    Sumner Boast., PT 07/03/2015, 3:14 PM  [address] [place], [place], [address] Phone: [phone]   Fax:  [phone]  Name: Debbie Hayes MRN: [id number] Date of Birth: 1950/09/21

## 2015-07-03 NOTE — Progress Notes (Signed)
Subjective:  This chart was scribed for Debbie Ray, MD by Moises Blood, Medical Scribe. This patient was seen in room 1 and the patient's care was started 11:14 AM.   Patient ID: Debbie Hayes, female    DOB: 11-Jul-1951, 64 y.o.   MRN: [id number] Chief Complaint  Patient presents with  . possible uti    HPI Debbie Hayes is a 64 y.o. female Here for possible UTI after home testing. Last seen 05/31/2015 with Dr. Joseph Art. On UA, she only had few WBC and small LE. Was started on keflex. And was changed to amoxicillin after culture results with enterococcus. Prior to that, she was seen by me for klebsiella uti.   She saw her urologist and discussed doing macrobid. She's currently on trimpex. She has some frequency. She denies dysuria, hematuria, back pain, fever, chills, nausea.   Her husband is a provider here at the office.   Patient Active Problem List   Diagnosis Date Noted  . Spondylolisthesis of lumbar region 08/09/2014  . Back pain 02/17/2014  . Macular degeneration 02/17/2014  . Other and unspecified hyperlipidemia 10/14/2013  . Onychomycosis 12/01/2012  . Diabetes mellitus ([place]) 04/12/2012  . Arthritis 04/12/2012  . Hypothyroid 04/12/2012   Past Medical History  Diagnosis Date  . Rotator cuff disorder LEFT SHOULDER RTC IMPINGMENT  . Hyperlipidemia   . Normal nuclear stress test 01-25-2009  . Disorder of inner ear CAUSES VERTIGO OCCASIONALLY  . Chronic migraine BOTOX INJECTION EVERY 3 MONTHS  . Fibromyalgia   . Hemorrhoid   . Constipation   . Spondylolisthesis of lumbar region   . GERD (gastroesophageal reflux disease)   . Hypothyroidism   . History of thyroid cancer 2009  S/P TOTAL THYROIDECTOMY AND RADIATION  . Diabetes mellitus   . 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 Insomnia   . PONV (postoperative nausea and vomiting) SEVERE  . Chronic pain in right shoulder   . Left shoulder pain   . OA (osteoarthritis) JOINTS  . Hypertension  CARDIOLOGIST- DR Wynonia Lawman- WILL REQUEST LATE NOTE    DENIES S & S  . Allergy   . Cataract   . History of bladder infections   . Wears glasses   . Macular degeneration    Past Surgical History  Procedure Laterality Date  . Right knee closed manipulation  09-11-2010  . Total knee arthroplasty  07-16-2010    RIGHT  . Right knee arthroscopy/ partial lateral menisectomy/ tricompartment chondroplasty/ decompression cyst  10-26-2009  . Right knee arthroscopy  X2  BEFORE 2011  . Total thyroidectomy  2009    CANCER  (POST-OP BLEED)  AND RADIATION TX  . Left thumb joint replacement  2005    RIGHT DONE IN 2004  . Right shoulder arthroscopy  2010  &  2004  . Bilateral elbow surg.  1999  . Bilateral carpal tunnel release  1994  . Vaginal hysterectomy  1990  . Bilateral salpingoophectomy  1993    POST-OP URETER REPAIR 12 DAYS AFTER   . Tonsillectomy  1968  . Tibia cyst removed and orif leg fx  1958  . Trigger fingers Right 2013    3rd and 4th fingers   Allergies  Allergen Reactions  . Other Other (See Comments)    Artificial sweetners  . Sulfa Antibiotics Hives   Prior to Admission medications   Medication Sig Start Date End Date Taking? Authorizing Provider  aspirin 81 MG tablet Take 81 mg by mouth daily.  Yes Historical Provider, MD  azelastine (ASTELIN) 137 MCG/SPRAY nasal spray Place 2 sprays into both nostrils 2 (two) times daily. Use in each nostril as directed 10/14/13  Yes [name], MD  blood glucose meter kit and supplies KIT Test blood sugar once daily. Dx code: E11.9 03/08/15  Yes [name], MD  cephALEXin (KEFLEX) 500 MG capsule Take 1 capsule (500 mg total) by mouth 2 (two) times daily. 05/01/15  Yes [name], MD  cetirizine (ZYRTEC) 10 MG tablet Take 10 mg by mouth daily as needed for allergies.    Yes Historical Provider, MD  glucose blood test strip Test blood sugar once daily. Dx code: E11.9 03/08/15  Yes [name], MD  Lancets MISC Test blood sugar once  daily. Dx code: E11.9 03/08/15  Yes [name], MD  levothyroxine (SYNTHROID, LEVOTHROID) 137 MCG tablet Take 137 mcg by mouth daily before breakfast.   Yes Historical Provider, MD  lidocaine (LIDODERM) 5 % APPLY 1 PATCH FOR 12 HOURS OUT OF 24 HOURS 01/17/14  Yes Mancel Bale, PA-C  losartan (COZAAR) 50 MG tablet TAKE 1 TABLET BY MOUTH ONCE DAILY 02/06/15  Yes [name], MD  metaxalone (SKELAXIN) 800 MG tablet Take 1 tablet (800 mg total) by mouth 3 (three) times daily. 04/25/15  Yes [name], MD  metFORMIN (GLUCOPHAGE) 1000 MG tablet TAKE 1 TABLET BY MOUTH 2 TIMES DAILY WITH A MEAL. 05/01/15  Yes [name], MD  metoprolol succinate (TOPROL-XL) 25 MG 24 hr tablet Take 12.5 mg by mouth every morning.   Yes Historical Provider, MD  mometasone [facility]) 220 MCG/INH inhaler Inhale 2 puffs into the lungs daily. 10/14/13  Yes [name], MD  Multiple Vitamins-Minerals (PRESERVISION AREDS 2 PO) Take by mouth 2 (two) times daily.   Yes Historical Provider, MD  NASONEX 50 MCG/ACT nasal spray PLACE 2 SPRAYS INTO THE NOSE DAILY. 09/20/14  Yes [name], MD  ondansetron (ZOFRAN-ODT) 4 MG disintegrating tablet DISSOLVE 1 TABLET BY MOUTH EVERY 8 HOURS AS NEEDED FOR NAUSEA. 12/25/14  Yes Shawnee Knapp, MD  phenazopyridine (PYRIDIUM) 200 MG tablet Take 1 tablet (200 mg total) by mouth 3 (three) times daily as needed for pain. 06/28/14  Yes Chelle Jeffery, PA-C  Probiotic Product (PRO-BIOTIC BLEND) CAPS Take by mouth.   Yes Historical Provider, MD  traZODone (DESYREL) 100 MG tablet TAKE 1-1/2-2 TABLETS BY MOUTH AT BEDTIME 02/06/15  Yes [name], MD  trimethoprim (TRIMPEX) 100 MG tablet Take 1 tablet (100 mg total) by mouth daily. 05/16/15  Yes [name], MD  zolpidem (AMBIEN) 5 MG tablet TAKE 1 TABLET BY MOUTH AT BEDTIME AS NEEDED FOR SLEEP 04/24/15  Yes [name], MD  amoxicillin (AMOXIL) 500 MG capsule Take 1 capsule (500 mg total) by mouth 3 (three) times daily. Patient not taking: Reported  on 07/03/2015 06/04/15   Robyn Haber, MD   Social History   Social History  . Marital Status: Married    Spouse Name: N/A  . Number of Children: N/A  . Years of Education: N/A   Occupational History  . Not on file.   Social History Main Topics  . Smoking status: Never Smoker   . Smokeless tobacco: Never Used  . Alcohol Use: Yes     Comment: RARE  . Drug Use: No  . Sexual Activity: Not on file   Other Topics Concern  . Not on file   Social History  Narrative   Review of Systems  Constitutional: Negative for fever, chills and fatigue.  Gastrointestinal: Negative for nausea, vomiting, diarrhea and constipation.  Genitourinary: Positive for frequency. Negative for dysuria, urgency and hematuria.       Objective:   Physical Exam  Constitutional: She is oriented to person, place, and time. She appears well-developed and well-nourished. No distress.  HENT:  Head: Normocephalic and atraumatic.  Eyes: EOM are normal. Pupils are equal, round, and reactive to light.  Neck: Neck supple.  Cardiovascular: Normal rate.   Pulmonary/Chest: Effort normal. No respiratory distress.  Abdominal: There is no tenderness. There is no CVA tenderness.  Musculoskeletal: Normal range of motion.  Neurological: She is alert and oriented to person, place, and time.  Skin: Skin is warm and dry.  Psychiatric: She has a normal mood and affect. Her behavior is normal.  Nursing note and vitals reviewed.   Filed Vitals:   07/03/15 1054  BP: 126/74  Pulse: 80  Temp: 98.4 F (36.9 C)  TempSrc: Oral  Resp: 16  Height: _0  (1.727 m)  Weight: 187 lb 6.4 oz (85.004 kg)  SpO2: 98%   Results for orders placed or performed in visit on 07/03/15  POCT Microscopic Urinalysis ([facility])  Result Value Ref Range   WBC,UR,HPF,POC Too numerous to count  (A) None WBC/hpf   RBC,UR,HPF,POC Moderate (A) None RBC/hpf   Bacteria Few (A) None, Too numerous to count   Mucus Absent Absent   Epithelial Cells, UR  Per Microscopy Few (A) None, Too numerous to count cells/hpf  POCT urinalysis dipstick  Result Value Ref Range   Color, UA yellow yellow   Clarity, UA cloudy (A) clear   Glucose, UA negative negative   Bilirubin, UA negative negative   Ketones, POC UA negative negative   Spec Grav, UA 1.010    Blood, UA moderate (A) negative   pH, UA 6.0    Protein Ur, POC negative negative   Urobilinogen, UA 0.2    Nitrite, UA Negative Negative   Leukocytes, UA large (3+) (A) Negative        Assessment & Plan:  LORELL THIBODAUX is a 64 y.o. female Frequent urination - Plan: POCT Microscopic Urinalysis ([facility]), POCT urinalysis dipstick, Urine culture  Acute cystitis without hematuria - Plan: Urine culture  Acute UTI, early with hx of UTI.  Last 2 infections Klebsiella then enterococcus. Has Keflex 515m at home.   -keflex 5065mBID for 10 days, urine culture.   -continue Trimpex, but if continued frequent UTI, may need to discuss change in prophylaxis with urology.   No orders of the defined types were placed in this encounter.   Patient Instructions  Restart keflex (home dose) twice per day for 10 days. Follow up as scheduled with urology to determine if change in prophylaxis needed.  Return to the clinic or go to the nearest emergency room if any of your symptoms worsen or new symptoms occur.   I personally performed the services described in this documentation, which was scribed in my presence. The recorded information has been reviewed and considered, and addended by me as needed.   By signing my name below, I, TsMoises Bloodattest that this documentation has been prepared under the direction and in the presence of JeMerri RayMD. Electronically Signed: TsMoises BloodScKendallville12/12/2014 , 11:15 AM .

## 2015-07-03 NOTE — Patient Instructions (Signed)
Restart keflex (home dose) twice per day for 10 days. Follow up as scheduled with urology to determine if change in prophylaxis needed.  Return to the clinic or go to the nearest emergency room if any of your symptoms worsen or new symptoms occur.

## 2015-07-05 LAB — URINE CULTURE: Colony Count: >=100000

## 2015-07-09 ENCOUNTER — Ambulatory Visit: Payer: 59 | Admitting: Physical Therapy

## 2015-07-09 ENCOUNTER — Encounter: Payer: Self-pay | Admitting: Physical Therapy

## 2015-07-09 DIAGNOSIS — M25561 Pain in right knee: Secondary | ICD-10-CM | POA: Diagnosis not present

## 2015-07-09 DIAGNOSIS — R262 Difficulty in walking, not elsewhere classified: Secondary | ICD-10-CM

## 2015-07-09 NOTE — Therapy (Signed)
[address] [place], [place], [address] Phone: [phone]   Fax:  [phone]  Physical Therapy Treatment  Patient Details  Name: Debbie Hayes MRN: [id number] Date of Birth: 19-Dec-1950 Referring Provider: Margit Banda  Encounter Date: 07/09/2015      PT End of Session - 07/09/15 1416    Visit Number 2   Date for PT Re-Evaluation 09/03/15   PT Start Time 1353   PT Stop Time 1441   PT Time Calculation (min) 48 min   Activity Tolerance Patient tolerated treatment well   Behavior During Therapy [facility] for tasks assessed/performed      Past Medical History  Diagnosis Date  . Rotator cuff disorder LEFT SHOULDER RTC IMPINGMENT  . Hyperlipidemia   . Normal nuclear stress test 01-25-2009  . Disorder of inner ear CAUSES VERTIGO OCCASIONALLY  . Chronic migraine BOTOX INJECTION EVERY 3 MONTHS  . Fibromyalgia   . Hemorrhoid   . Constipation   . Spondylolisthesis of lumbar region   . GERD (gastroesophageal reflux disease)   . Hypothyroidism   . History of thyroid cancer 2009  S/P TOTAL THYROIDECTOMY AND RADIATION  . Diabetes mellitus   . 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 Insomnia   . PONV (postoperative nausea and vomiting) SEVERE  . Chronic pain in right shoulder   . Left shoulder pain   . OA (osteoarthritis) JOINTS  . Hypertension CARDIOLOGIST- DR Wynonia Lawman- WILL REQUEST LATE NOTE    DENIES S & S  . Allergy   . Cataract   . History of bladder infections   . Wears glasses   . Macular degeneration     Past Surgical History  Procedure Laterality Date  . Right knee closed manipulation  09-11-2010  . Total knee arthroplasty  07-16-2010    RIGHT  . Right knee arthroscopy/ partial lateral menisectomy/ tricompartment chondroplasty/ decompression cyst  10-26-2009  . Right knee arthroscopy  X2  BEFORE 2011  . Total thyroidectomy  2009    CANCER  (POST-OP BLEED)  AND RADIATION TX  . Left thumb  joint replacement  2005    RIGHT DONE IN 2004  . Right shoulder arthroscopy  2010  &  2004  . Bilateral elbow surg.  1999  . Bilateral carpal tunnel release  1994  . Vaginal hysterectomy  1990  . Bilateral salpingoophectomy  1993    POST-OP URETER REPAIR 12 DAYS AFTER   . Tonsillectomy  1968  . Tibia cyst removed and orif leg fx  1958  . Trigger fingers Right 2013    3rd and 4th fingers    There were no vitals filed for this visit.  Visit Diagnosis:  Right knee pain  Difficulty walking      Subjective Assessment - 07/09/15 1352    Subjective Reports that she had increased right medial knee pain over the weekend, mild increase of walking but she does not feel that the pain should have been that high   Currently in Pain? Yes   Pain Score 4    Pain Location Knee   Pain Orientation Right;Mid;Anterior   Pain Descriptors / Indicators Sharp   Pain Type Acute pain   Pain Onset More than a month ago   Pain Frequency Intermittent   Aggravating Factors  walking over the weekend pain was a 6/10   Pain Relieving Factors rest and NSAID's  [facility] Adult PT Treatment/Exercise - 07/09/15 0001    Electrical Stimulation   Electrical Stimulation Location right medial knee   Electrical Stimulation Action IFC   Electrical Stimulation Parameters tolerance   Electrical Stimulation Goals Pain   Ultrasound   Ultrasound Location right medial knee   Ultrasound Parameters 100% 1.3w/cm2   Ultrasound Goals Pain   Iontophoresis   Type of Iontophoresis Dexamethasone   Location right medial knee   Dose 65mA   Time 4 hour patch   Manual Therapy   Manual therapy comments gentle joint mobs of the right knee                  PT Short Term Goals - 07/03/15 1510    PT SHORT TERM GOAL #1   Title independent with initial HEP   Time 1   Period Weeks   Status New           PT Long Term Goals - 07/03/15 1510    PT LONG TERM GOAL #1   Title  independent with proper posture and body mechanics   Time 8   Period Weeks   Status New   PT LONG TERM GOAL #2   Title increase right knee ROM to 0-115 degrees flexion   Time 8   Period Weeks   Status New   PT LONG TERM GOAL #3   Title reports 25% easier to walk int he neighbor hood   Time 8   Period Weeks   Status New   PT LONG TERM GOAL #4   Title decrease pain 25%   Time 8   Period Weeks   Status New               Plan - 07/09/15 1416    Clinical Impression Statement Patient with very specific tenderness in the right anterior/medial knee.  Dx is pes anserine bursitis.     PT Next Visit Plan address with pain and inflammation as she will travel internationally in January   Consulted and Agree with Plan of Care Patient        Problem List Patient Active Problem List   Diagnosis Date Noted  . Spondylolisthesis of lumbar region 08/09/2014  . Back pain 02/17/2014  . Macular degeneration 02/17/2014  . Other and unspecified hyperlipidemia 10/14/2013  . Onychomycosis 12/01/2012  . Diabetes mellitus ([place]) 04/12/2012  . Arthritis 04/12/2012  . Hypothyroid 04/12/2012    Sumner Boast., PT 07/09/2015, 2:18 PM  [address] [place], [place], [address] Phone: [phone]   Fax:  [phone]  Name: MARIANITA KIRCH MRN: [id number] Date of Birth: [birth year]

## 2015-07-12 ENCOUNTER — Encounter: Payer: Self-pay | Admitting: Physical Therapy

## 2015-07-12 ENCOUNTER — Ambulatory Visit: Payer: 59 | Admitting: Physical Therapy

## 2015-07-12 DIAGNOSIS — M25561 Pain in right knee: Secondary | ICD-10-CM

## 2015-07-12 DIAGNOSIS — R262 Difficulty in walking, not elsewhere classified: Secondary | ICD-10-CM

## 2015-07-12 NOTE — Therapy (Signed)
[place] [place] [place], [place], [address] Phone: [phone]   Fax:  [phone]  Physical Therapy Treatment  Patient Details  Name: Debbie Hayes MRN: [id number] Date of Birth: 1950-11-04 Referring Provider: Margit Banda  Encounter Date: 07/12/2015      PT End of Session - 07/12/15 1345    Visit Number 3   Date for PT Re-Evaluation 09/03/15   PT Start Time 1314   PT Stop Time 1408   PT Time Calculation (min) 54 min   Activity Tolerance Patient tolerated treatment well   Behavior During Therapy [facility] for tasks assessed/performed      Past Medical History  Diagnosis Date  . Rotator cuff disorder LEFT SHOULDER RTC IMPINGMENT  . Hyperlipidemia   . Normal nuclear stress test 01-25-2009  . Disorder of inner ear CAUSES VERTIGO OCCASIONALLY  . Chronic migraine BOTOX INJECTION EVERY 3 MONTHS  . Fibromyalgia   . Hemorrhoid   . Constipation   . Spondylolisthesis of lumbar region   . GERD (gastroesophageal reflux disease)   . Hypothyroidism   . History of thyroid cancer 2009  S/P TOTAL THYROIDECTOMY AND RADIATION  . Diabetes mellitus   . 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 Insomnia   . PONV (postoperative nausea and vomiting) SEVERE  . Chronic pain in right shoulder   . Left shoulder pain   . OA (osteoarthritis) JOINTS  . Hypertension CARDIOLOGIST- DR Wynonia Lawman- WILL REQUEST LATE NOTE    DENIES S & S  . Allergy   . Cataract   . History of bladder infections   . Wears glasses   . Macular degeneration     Past Surgical History  Procedure Laterality Date  . Right knee closed manipulation  09-11-2010  . Total knee arthroplasty  07-16-2010    RIGHT  . Right knee arthroscopy/ partial lateral menisectomy/ tricompartment chondroplasty/ decompression cyst  10-26-2009  . Right knee arthroscopy  X2  BEFORE 2011  . Total thyroidectomy  2009    CANCER  (POST-OP BLEED)  AND RADIATION TX  . Left thumb  joint replacement  2005    RIGHT DONE IN 2004  . Right shoulder arthroscopy  2010  &  2004  . Bilateral elbow surg.  1999  . Bilateral carpal tunnel release  1994  . Vaginal hysterectomy  1990  . Bilateral salpingoophectomy  1993    POST-OP URETER REPAIR 12 DAYS AFTER   . Tonsillectomy  1968  . Tibia cyst removed and orif leg fx  1958  . Trigger fingers Right 2013    3rd and 4th fingers    There were no vitals filed for this visit.  Visit Diagnosis:  Right knee pain  Difficulty walking      Subjective Assessment - 07/12/15 1339    Subjective I little sore after I leave here.  I am still walking though.   Currently in Pain? Yes   Pain Score 3    Pain Location Knee   Pain Orientation Right;Medial   Pain Descriptors / Indicators Aching;[facility] Adult PT Treatment/Exercise - 07/12/15 0001    Electrical Stimulation   Electrical Stimulation Location right medial knee   Electrical Stimulation Action IFC   Electrical Stimulation Parameters tolerance   Electrical Stimulation Goals Pain   Ultrasound   Ultrasound Location right medial  knee   Ultrasound Parameters 100% 1MHz   Ultrasound Goals Pain   Iontophoresis   Type of Iontophoresis Dexamethasone   Location right medial knee   Dose 39mA   Time 4 hour patch   Manual Therapy   Manual therapy comments PROM of the HS, calf, IR/ER of the hip, quad and hip flexor to decrease stress on the knee                  PT Short Term Goals - 07/12/15 1348    PT SHORT TERM GOAL #1   Title independent with initial HEP   Status On-going           PT Long Term Goals - 07/03/15 1510    PT LONG TERM GOAL #1   Title independent with proper posture and body mechanics   Time 8   Period Weeks   Status New   PT LONG TERM GOAL #2   Title increase right knee ROM to 0-115 degrees flexion   Time 8   Period Weeks   Status New   PT LONG TERM GOAL #3   Title reports 25% easier to  walk int he neighbor hood   Time 8   Period Weeks   Status New   PT LONG TERM GOAL #4   Title decrease pain 25%   Time 8   Period Weeks   Status New               Plan - 07/12/15 1346    Clinical Impression Statement She has some tightness in the piriformis, quad, hip flexor and HS.  Again very specific tenderness in the medial area of the right knee   PT Next Visit Plan address with pain and inflammation as she will travel internationally in January   Consulted and Agree with Plan of Care Patient        Problem List Patient Active Problem List   Diagnosis Date Noted  . Spondylolisthesis of lumbar region 08/09/2014  . Back pain 02/17/2014  . Macular degeneration 02/17/2014  . Other and unspecified hyperlipidemia 10/14/2013  . Onychomycosis 12/01/2012  . Diabetes mellitus ([place]) 04/12/2012  . Arthritis 04/12/2012  . Hypothyroid 04/12/2012    Sumner Boast., PT 07/12/2015, 2:00 PM  [address] [place], [place], [address] Phone: [phone]   Fax:  [phone]  Name: Debbie Hayes MRN: [id number] Date of Birth: 1951-04-18

## 2015-07-16 ENCOUNTER — Ambulatory Visit: Payer: 59 | Admitting: Physical Therapy

## 2015-07-17 ENCOUNTER — Ambulatory Visit: Payer: 59 | Admitting: Physical Therapy

## 2015-07-17 ENCOUNTER — Encounter: Payer: Self-pay | Admitting: Physical Therapy

## 2015-07-17 DIAGNOSIS — M25561 Pain in right knee: Secondary | ICD-10-CM

## 2015-07-17 DIAGNOSIS — R262 Difficulty in walking, not elsewhere classified: Secondary | ICD-10-CM

## 2015-07-17 NOTE — Therapy (Signed)
[place], [place], [address] Phone: [phone]   Fax:  [phone]  Physical Therapy Treatment  Patient Details  Name: Debbie Hayes MRN: [id number] Date of Birth: 07/24/1951 Referring Provider: Margit Banda  Encounter Date: 07/17/2015      PT End of Session - 07/17/15 1002    Visit Number 4   Date for PT Re-Evaluation 09/03/15   PT Start Time 0923   PT Stop Time 1006   PT Time Calculation (min) 43 min   Activity Tolerance Patient tolerated treatment well   Behavior During Therapy [facility] for tasks assessed/performed      Past Medical History  Diagnosis Date  . Rotator cuff disorder LEFT SHOULDER RTC IMPINGMENT  . Hyperlipidemia   . Normal nuclear stress test 01-25-2009  . Disorder of inner ear CAUSES VERTIGO OCCASIONALLY  . Chronic migraine BOTOX INJECTION EVERY 3 MONTHS  . Fibromyalgia   . Hemorrhoid   . Constipation   . Spondylolisthesis of lumbar region   . GERD (gastroesophageal reflux disease)   . Hypothyroidism   . History of thyroid cancer 2009  S/P TOTAL THYROIDECTOMY AND RADIATION  . Diabetes mellitus   . 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 Insomnia   . PONV (postoperative nausea and vomiting) SEVERE  . Chronic pain in right shoulder   . Left shoulder pain   . OA (osteoarthritis) JOINTS  . Hypertension CARDIOLOGIST- DR Wynonia Lawman- WILL REQUEST LATE NOTE    DENIES S & S  . Allergy   . Cataract   . History of bladder infections   . Wears glasses   . Macular degeneration     Past Surgical History  Procedure Laterality Date  . Right knee closed manipulation  09-11-2010  . Total knee arthroplasty  07-16-2010    RIGHT  . Right knee arthroscopy/ partial lateral menisectomy/ tricompartment chondroplasty/ decompression cyst  10-26-2009  . Right knee arthroscopy  X2  BEFORE 2011  . Total thyroidectomy  2009    CANCER  (POST-OP BLEED)  AND RADIATION TX  . Left thumb  joint replacement  2005    RIGHT DONE IN 2004  . Right shoulder arthroscopy  2010  &  2004  . Bilateral elbow surg.  1999  . Bilateral carpal tunnel release  1994  . Vaginal hysterectomy  1990  . Bilateral salpingoophectomy  1993    POST-OP URETER REPAIR 12 DAYS AFTER   . Tonsillectomy  1968  . Tibia cyst removed and orif leg fx  1958  . Trigger fingers Right 2013    3rd and 4th fingers    There were no vitals filed for this visit.  Visit Diagnosis:  Right knee pain  Difficulty walking      Subjective Assessment - 07/17/15 1005    Subjective Reports being in the car most of the day yesterday and is sore today.   Currently in Pain? Yes   Pain Score 3    Pain Location Knee   Pain Orientation Right;Medial   Pain Descriptors / Indicators Aching   Aggravating Factors  sitting or walking longer periods   Pain Relieving Factors rest                         [facility] Adult PT Treatment/Exercise - 07/17/15 0001    Electrical Stimulation   Electrical Stimulation Location right medial knee   Electrical Stimulation Action IFC   Electrical Stimulation  Parameters tolerance   Electrical Stimulation Goals Pain   Ultrasound   Ultrasound Location right medial knee   Ultrasound Parameters 100% 1MHz   Ultrasound Goals Pain   Manual Therapy   Manual Therapy Taping   Manual therapy comments gentle STM to the medial right knee and PROM of the right lknee   Kinesiotex Inhibit Muscle   Kinesiotix   Inhibit Muscle  pes anserine                  PT Short Term Goals - 07/17/15 1006    PT SHORT TERM GOAL #1   Title independent with initial HEP   Status Achieved           PT Long Term Goals - 07/03/15 1510    PT LONG TERM GOAL #1   Title independent with proper posture and body mechanics   Time 8   Period Weeks   Status New   PT LONG TERM GOAL #2   Title increase right knee ROM to 0-115 degrees flexion   Time 8   Period Weeks   Status New   PT LONG  TERM GOAL #3   Title reports 25% easier to walk int he neighbor hood   Time 8   Period Weeks   Status New   PT LONG TERM GOAL #4   Title decrease pain 25%   Time 8   Period Weeks   Status New               Plan - 07/17/15 1003    Clinical Impression Statement Patient was in the car most of day yesterday with some increased pain today.  She is continuing to walk at night.    PT Next Visit Plan I tried K-tape on the medial knee to decrease stress.  See if this helps.   Consulted and Agree with Plan of Care Patient        Problem List Patient Active Problem List   Diagnosis Date Noted  . Spondylolisthesis of lumbar region 08/09/2014  . Back pain 02/17/2014  . Macular degeneration 02/17/2014  . Other and unspecified hyperlipidemia 10/14/2013  . Onychomycosis 12/01/2012  . Diabetes mellitus ([place]) 04/12/2012  . Arthritis 04/12/2012  . Hypothyroid 04/12/2012    Sumner Boast., PT 07/17/2015, 10:08 AM  [address] [place], [place], [address] Phone: [phone]   Fax:  [phone]  Name: [name] MRN: [id number] Date of Birth: 24-Jul-1951

## 2015-07-19 ENCOUNTER — Encounter: Payer: Self-pay | Admitting: Physical Therapy

## 2015-07-19 ENCOUNTER — Ambulatory Visit: Payer: 59 | Admitting: Physical Therapy

## 2015-07-19 DIAGNOSIS — M25561 Pain in right knee: Secondary | ICD-10-CM

## 2015-07-19 DIAGNOSIS — R262 Difficulty in walking, not elsewhere classified: Secondary | ICD-10-CM

## 2015-07-19 NOTE — Therapy (Signed)
[place] [address] [place], [place], [address] Phone: [phone]   Fax:  [phone]  Physical Therapy Treatment  Patient Details  Name: Debbie Hayes MRN: [id number] Date of Birth: 1951/02/28 Referring Provider: Margit Banda  Encounter Date: 07/19/2015      PT End of Session - 07/19/15 1509    Visit Number 5   Date for PT Re-Evaluation 09/03/15   PT Start Time [id number]   PT Stop Time 1527   PT Time Calculation (min) 48 min   Activity Tolerance Patient tolerated treatment well   Behavior During Therapy [facility] for tasks assessed/performed      Past Medical History  Diagnosis Date  . Rotator cuff disorder LEFT SHOULDER RTC IMPINGMENT  . Hyperlipidemia   . Normal nuclear stress test 01-25-2009  . Disorder of inner ear CAUSES VERTIGO OCCASIONALLY  . Chronic migraine BOTOX INJECTION EVERY 3 MONTHS  . Fibromyalgia   . Hemorrhoid   . Constipation   . Spondylolisthesis of lumbar region   . GERD (gastroesophageal reflux disease)   . Hypothyroidism   . History of thyroid cancer 2009  S/P TOTAL THYROIDECTOMY AND RADIATION  . Diabetes mellitus   . 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 Insomnia   . PONV (postoperative nausea and vomiting) SEVERE  . Chronic pain in right shoulder   . Left shoulder pain   . OA (osteoarthritis) JOINTS  . Hypertension CARDIOLOGIST- DR Wynonia Lawman- WILL REQUEST LATE NOTE    DENIES S & S  . Allergy   . Cataract   . History of bladder infections   . Wears glasses   . Macular degeneration     Past Surgical History  Procedure Laterality Date  . Right knee closed manipulation  09-11-2010  . Total knee arthroplasty  07-16-2010    RIGHT  . Right knee arthroscopy/ partial lateral menisectomy/ tricompartment chondroplasty/ decompression cyst  10-26-2009  . Right knee arthroscopy  X2  BEFORE 2011  . Total thyroidectomy  2009    CANCER  (POST-OP BLEED)  AND RADIATION TX  . Left thumb  joint replacement  2005    RIGHT DONE IN 2004  . Right shoulder arthroscopy  2010  &  2004  . Bilateral elbow surg.  1999  . Bilateral carpal tunnel release  1994  . Vaginal hysterectomy  1990  . Bilateral salpingoophectomy  1993    POST-OP URETER REPAIR 12 DAYS AFTER   . Tonsillectomy  1968  . Tibia cyst removed and orif leg fx  1958  . Trigger fingers Right 2013    3rd and 4th fingers    There were no vitals filed for this visit.  Visit Diagnosis:  Right knee pain  Difficulty walking      Subjective Assessment - 07/19/15 1506    Subjective Patient reports that this is the best her knee has felt in a few months, she reports that the tape seemed to really help.  She was able to shop today with much less pain   Currently in Pain? Yes   Pain Score 1    Pain Location Knee   Pain Orientation Right;Medial   Pain Relieving Factors the tape really seemed to help            [facility] PT Assessment - 07/19/15 0001    AROM   Overall AROM Comments AROM 3-106 degrees  [facility] Adult PT Treatment/Exercise - 07/19/15 0001    Electrical Stimulation   Electrical Stimulation Location right medial knee   Electrical Stimulation Action IFC   Electrical Stimulation Parameters tolerance   Electrical Stimulation Goals Pain   Ultrasound   Ultrasound Location right medial knee   Ultrasound Parameters 100% 1.3 w/cm2   Ultrasound Goals Pain   Manual Therapy   Manual Therapy Taping   Manual therapy comments gentle STM to the medial right knee and PROM of the right lknee   Kinesiotex Inhibit Muscle   Kinesiotix   Inhibit Muscle  pes anserine                  PT Short Term Goals - 07/17/15 1006    PT SHORT TERM GOAL #1   Title independent with initial HEP   Status Achieved           PT Long Term Goals - 07/19/15 1510    PT LONG TERM GOAL #1   Title independent with proper posture and body mechanics   Status Achieved                Plan - 07/19/15 1509    Clinical Impression Statement The kinesio tape really seemed to help decrease her pain.     PT Next Visit Plan taped again today and she is to try that, I gave her 3 pieces and tried to teach her how to tape on her own.   Consulted and Agree with Plan of Care Patient        Problem List Patient Active Problem List   Diagnosis Date Noted  . Spondylolisthesis of lumbar region 08/09/2014  . Back pain 02/17/2014  . Macular degeneration 02/17/2014  . Other and unspecified hyperlipidemia 10/14/2013  . Onychomycosis 12/01/2012  . Diabetes mellitus ([place]) 04/12/2012  . Arthritis 04/12/2012  . Hypothyroid 04/12/2012    Sumner Boast., PT 07/19/2015, 3:11 PM  [address] [place], [place], [address] Phone: [phone]   Fax:  [phone]  Name: Debbie Hayes MRN: [id number] Date of Birth: [birth year]

## 2015-07-25 ENCOUNTER — Encounter: Payer: Self-pay | Admitting: Physical Therapy

## 2015-07-25 ENCOUNTER — Ambulatory Visit: Payer: 59 | Admitting: Physical Therapy

## 2015-07-25 DIAGNOSIS — M25561 Pain in right knee: Secondary | ICD-10-CM

## 2015-07-25 DIAGNOSIS — R262 Difficulty in walking, not elsewhere classified: Secondary | ICD-10-CM

## 2015-07-25 NOTE — Therapy (Signed)
[place], [place], [address] Phone: [phone]   Fax:  [phone]  Physical Therapy Treatment  Patient Details  Name: Debbie Hayes MRN: [id number] Date of Birth: 1950/12/12 Referring Provider: Margit Banda  Encounter Date: 07/25/2015      PT End of Session - 07/25/15 1428    Visit Number 6   Date for PT Re-Evaluation 09/03/15   PT Start Time 1355   PT Stop Time 1440   PT Time Calculation (min) 45 min   Activity Tolerance Patient tolerated treatment well   Behavior During Therapy [facility] for tasks assessed/performed      Past Medical History  Diagnosis Date  . Rotator cuff disorder LEFT SHOULDER RTC IMPINGMENT  . Hyperlipidemia   . Normal nuclear stress test 01-25-2009  . Disorder of inner ear CAUSES VERTIGO OCCASIONALLY  . Chronic migraine BOTOX INJECTION EVERY 3 MONTHS  . Fibromyalgia   . Hemorrhoid   . Constipation   . Spondylolisthesis of lumbar region   . GERD (gastroesophageal reflux disease)   . Hypothyroidism   . History of thyroid cancer 2009  S/P TOTAL THYROIDECTOMY AND RADIATION  . Diabetes mellitus   . 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 Insomnia   . PONV (postoperative nausea and vomiting) SEVERE  . Chronic pain in right shoulder   . Left shoulder pain   . OA (osteoarthritis) JOINTS  . Hypertension CARDIOLOGIST- DR Wynonia Lawman- WILL REQUEST LATE NOTE    DENIES S & S  . Allergy   . Cataract   . History of bladder infections   . Wears glasses   . Macular degeneration     Past Surgical History  Procedure Laterality Date  . Right knee closed manipulation  09-11-2010  . Total knee arthroplasty  07-16-2010    RIGHT  . Right knee arthroscopy/ partial lateral menisectomy/ tricompartment chondroplasty/ decompression cyst  10-26-2009  . Right knee arthroscopy  X2  BEFORE 2011  . Total thyroidectomy  2009    CANCER  (POST-OP BLEED)  AND RADIATION TX  . Left thumb  joint replacement  2005    RIGHT DONE IN 2004  . Right shoulder arthroscopy  2010  &  2004  . Bilateral elbow surg.  1999  . Bilateral carpal tunnel release  1994  . Vaginal hysterectomy  1990  . Bilateral salpingoophectomy  1993    POST-OP URETER REPAIR 12 DAYS AFTER   . Tonsillectomy  1968  . Tibia cyst removed and orif leg fx  1958  . Trigger fingers Right 2013    3rd and 4th fingers    There were no vitals filed for this visit.  Visit Diagnosis:  Right knee pain  Difficulty walking      Subjective Assessment - 07/25/15 1420    Subjective Patient reports that the tape really is helping.  She is concerned about the price of the tape and that is not the cure, but she is amazed at how good it feels.  Reports that she took the tape off last night and had some things that she was doing and  and reports that without the tape her knee is hurting more   Currently in Pain? Yes   Pain Score 3    Pain Location Knee   Pain Orientation Right;Medial   Pain Relieving Factors the tape            [facility] PT Assessment - 07/25/15 0001  AROM   Overall AROM Comments AROM 2-107 degrees flexion                     [facility] Adult PT Treatment/Exercise - 07/25/15 0001    Electrical Stimulation   Electrical Stimulation Location right medial knee   Electrical Stimulation Action IFC   Electrical Stimulation Parameters tolerance   Electrical Stimulation Goals Pain   Ultrasound   Ultrasound Location right medial   Ultrasound Parameters 100% 1MHz   Ultrasound Goals Pain   Manual Therapy   Manual Therapy Taping   Manual therapy comments gentle STM to the medial right knee and PROM of the right lknee   Kinesiotex Inhibit Muscle   Kinesiotix   Inhibit Muscle  pes anserine                  PT Short Term Goals - 07/17/15 1006    PT SHORT TERM GOAL #1   Title independent with initial HEP   Status Achieved           PT Long Term Goals - 07/25/15 1436    PT LONG  TERM GOAL #2   Title increase right knee ROM to 0-115 degrees flexion   Status Partially Met               Plan - 07/25/15 1434    Clinical Impression Statement Patient continues to have great relief with the Kinesio tape, and pain without it.  Will see MD in two weeks   PT Next Visit Plan Tried two pieces of tape instead of 3.  She reported having difficulty with the longer piece sticking to her   Consulted and Agree with Plan of Care Patient        Problem List Patient Active Problem List   Diagnosis Date Noted  . Spondylolisthesis of lumbar region 08/09/2014  . Back pain 02/17/2014  . Macular degeneration 02/17/2014  . Other and unspecified hyperlipidemia 10/14/2013  . Onychomycosis 12/01/2012  . Diabetes mellitus ([place]) 04/12/2012  . Arthritis 04/12/2012  . Hypothyroid 04/12/2012    Sumner Boast., PT 07/25/2015, 2:38 PM  [place] [address] [place], [place], [address] Phone: [phone]   Fax:  [phone]  Name: Debbie Hayes MRN: [id number] Date of Birth: Dec 08, 1950

## 2015-07-26 ENCOUNTER — Ambulatory Visit: Payer: 59 | Admitting: Physical Therapy

## 2015-08-02 ENCOUNTER — Ambulatory Visit: Payer: 59 | Attending: Specialist | Admitting: Physical Therapy

## 2015-08-02 ENCOUNTER — Encounter: Payer: Self-pay | Admitting: Physical Therapy

## 2015-08-02 DIAGNOSIS — R262 Difficulty in walking, not elsewhere classified: Secondary | ICD-10-CM | POA: Insufficient documentation

## 2015-08-02 DIAGNOSIS — M25561 Pain in right knee: Secondary | ICD-10-CM | POA: Diagnosis not present

## 2015-08-02 MED FILL — MOMETASONE FUROATE 50 MCG S: 50 | 90 days supply | Qty: 51 | Fill #3

## 2015-08-02 NOTE — Therapy (Signed)
[place], [place], [address] Phone: [phone]   Fax:  [phone]  Physical Therapy Treatment  Patient Details  Name: Debbie Hayes MRN: [id number] Date of Birth: 1951/04/06 Referring Provider: Margit Banda  Encounter Date: 08/02/2015      PT End of Session - 08/02/15 1607    Visit Number 7   Date for PT Re-Evaluation 09/03/15   PT Start Time 1401   PT Stop Time 1445   PT Time Calculation (min) 44 min   Activity Tolerance Patient tolerated treatment well   Behavior During Therapy [facility] for tasks assessed/performed      Past Medical History  Diagnosis Date  . Rotator cuff disorder LEFT SHOULDER RTC IMPINGMENT  . Hyperlipidemia   . Normal nuclear stress test 01-25-2009  . Disorder of inner ear CAUSES VERTIGO OCCASIONALLY  . Chronic migraine BOTOX INJECTION EVERY 3 MONTHS  . Fibromyalgia   . Hemorrhoid   . Constipation   . Spondylolisthesis of lumbar region   . GERD (gastroesophageal reflux disease)   . Hypothyroidism   . History of thyroid cancer 2009  S/P TOTAL THYROIDECTOMY AND RADIATION  . Diabetes mellitus   . 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 Insomnia   . PONV (postoperative nausea and vomiting) SEVERE  . Chronic pain in right shoulder   . Left shoulder pain   . OA (osteoarthritis) JOINTS  . Hypertension CARDIOLOGIST- DR Wynonia Lawman- WILL REQUEST LATE NOTE    DENIES S & S  . Allergy   . Cataract   . History of bladder infections   . Wears glasses   . Macular degeneration     Past Surgical History  Procedure Laterality Date  . Right knee closed manipulation  09-11-2010  . Total knee arthroplasty  07-16-2010    RIGHT  . Right knee arthroscopy/ partial lateral menisectomy/ tricompartment chondroplasty/ decompression cyst  10-26-2009  . Right knee arthroscopy  X2  BEFORE 2011  . Total thyroidectomy  2009    CANCER  (POST-OP BLEED)  AND RADIATION TX  . Left thumb joint  replacement  2005    RIGHT DONE IN 2004  . Right shoulder arthroscopy  2010  &  2004  . Bilateral elbow surg.  1999  . Bilateral carpal tunnel release  1994  . Vaginal hysterectomy  1990  . Bilateral salpingoophectomy  1993    POST-OP URETER REPAIR 12 DAYS AFTER   . Tonsillectomy  1968  . Tibia cyst removed and orif leg fx  1958  . Trigger fingers Right 2013    3rd and 4th fingers    There were no vitals filed for this visit.  Visit Diagnosis:  Right knee pain  Difficulty walking      Subjective Assessment - 08/02/15 1405    Subjective I really think I am doing much better, the tape really may have helped.   Currently in Pain? Yes   Pain Score 1    Pain Location Knee   Pain Orientation Right;Medial                         [facility] Adult PT Treatment/Exercise - 08/02/15 0001    Electrical Stimulation   Electrical Stimulation Location right medial knee   Electrical Stimulation Action IFC   Electrical Stimulation Parameters tolerance   Electrical Stimulation Goals Pain   Ultrasound   Ultrasound Location right medial knee   Ultrasound Parameters 100%  1MHz   Ultrasound Goals Pain   Iontophoresis   Type of Iontophoresis Dexamethasone   Location right medial knee   Dose 41mA   Time 4 hour patch                  PT Short Term Goals - 07/17/15 1006    PT SHORT TERM GOAL #1   Title independent with initial HEP   Status Achieved           PT Long Term Goals - 08/02/15 1711    PT LONG TERM GOAL #2   Title increase right knee ROM to 0-115 degrees flexion   Status Achieved   PT LONG TERM GOAL #3   Title reports 25% easier to walk int he neighbor hood   Status Achieved   PT LONG TERM GOAL #4   Title decrease pain 25%   Status Achieved               Plan - 08/02/15 1607    Clinical Impression Statement Patient feels that she is doing much better with her knee and not having pain with walking   PT Next Visit Plan we will hold  treatment        Problem List Patient Active Problem List   Diagnosis Date Noted  . Spondylolisthesis of lumbar region 08/09/2014  . Back pain 02/17/2014  . Macular degeneration 02/17/2014  . Other and unspecified hyperlipidemia 10/14/2013  . Onychomycosis 12/01/2012  . Diabetes mellitus ([place]) 04/12/2012  . Arthritis 04/12/2012  . Hypothyroid 04/12/2012    Sumner Boast., PT 08/02/2015, 5:17 PM  [place] [address] [place], [place], [address] Phone: [phone]   Fax:  [phone]  Name: Debbie Hayes MRN: [id number] Date of Birth: August 08, 1950

## 2015-08-10 ENCOUNTER — Ambulatory Visit (INDEPENDENT_AMBULATORY_CARE_PROVIDER_SITE_OTHER): Payer: 59 | Admitting: Emergency Medicine

## 2015-08-10 VITALS — BP 130/78 | HR 88 | Temp 97.9°F | Resp 20 | Ht 67.5 in | Wt 186.4 lb

## 2015-08-10 DIAGNOSIS — K219 Gastro-esophageal reflux disease without esophagitis: Secondary | ICD-10-CM | POA: Diagnosis not present

## 2015-08-10 DIAGNOSIS — N390 Urinary tract infection, site not specified: Secondary | ICD-10-CM

## 2015-08-10 DIAGNOSIS — D229 Melanocytic nevi, unspecified: Secondary | ICD-10-CM

## 2015-08-10 DIAGNOSIS — R110 Nausea: Secondary | ICD-10-CM

## 2015-08-10 DIAGNOSIS — D239 Other benign neoplasm of skin, unspecified: Secondary | ICD-10-CM | POA: Diagnosis not present

## 2015-08-10 MED ORDER — ONDANSETRON HCL 4 MG PO TABS: 4.0000 mg | ORAL_TABLET | Freq: Three times a day (TID) | ORAL | Status: DC | PRN

## 2015-08-10 MED ORDER — FAMOTIDINE 40 MG PO TABS: 40.0000 mg | ORAL_TABLET | Freq: Every day | ORAL | Status: DC

## 2015-08-10 MED ORDER — NITROFURANTOIN MONOHYD MACRO 100 MG PO CAPS: ORAL_CAPSULE | ORAL | Status: DC

## 2015-08-10 MED FILL — ONDANSETRON HCL 4 MG TABLET: 4 | 10 days supply | Qty: 30 | Fill #0

## 2015-08-10 MED FILL — FAMOTIDINE 40 MG TABLET: 40 | 90 days supply | Qty: 90 | Fill #0

## 2015-08-10 MED FILL — NITROFURANTOIN MONO-MCR 100: 100 | 30 days supply | Qty: 30 | Fill #0

## 2015-08-10 NOTE — Progress Notes (Signed)
Patient ID: Debbie Hayes, female   DOB: 02-19-1951, 65 y.o.   MRN: [id number]     By signing my name below, I, Zola Button, attest that this documentation has been prepared under the direction and in the presence of [name], MD.  Electronically Signed: Zola Button, Medical Scribe. 08/10/2015. 11:00 AM.   Chief Complaint:  Chief Complaint  Patient presents with  . Medication Refill     Zofran    HPI: Debbie Hayes is a 65 y.o. female with a history of DM, GERD, and chronic pain who reports to [facility] today for a medication refill for Zofran.  Patient would also like to have her left ear and her scalp evaluated. She believes she may have keratoses on her ear. She notes that her scalp has always been peeling skin, but recently noticed a divet in her scalp.  She also reports having frequent UTIs, about every 4-6 weeks. She was recently put on Macrobid for UTI. Patient plans to start taking daily Macrobid due to the frequency of UTIs. She notes that Dr. Karsten Ro had intended to start her on Macrobid qd. She is requesting a prescription for Macrobid until she can see Dr. Karsten Ro again.  Patient notes her blood sugar usually runs under 120s.  Patient is going to [place] in 11 days and will be returning on 2/4.  Past Medical History  Diagnosis Date  . Rotator cuff disorder LEFT SHOULDER RTC IMPINGMENT  . Hyperlipidemia   . Normal nuclear stress test 01-25-2009  . Disorder of inner ear CAUSES VERTIGO OCCASIONALLY  . Chronic migraine BOTOX INJECTION EVERY 3 MONTHS  . Fibromyalgia   . Hemorrhoid   . Constipation   . Spondylolisthesis of lumbar region   . GERD (gastroesophageal reflux disease)   . Hypothyroidism   . History of thyroid cancer 2009  S/P TOTAL THYROIDECTOMY AND RADIATION  . Diabetes mellitus   . 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 Insomnia   . PONV (postoperative nausea and vomiting) SEVERE  . Chronic pain in right shoulder   . Left shoulder pain   . OA  (osteoarthritis) JOINTS  . Hypertension CARDIOLOGIST- DR Wynonia Lawman- WILL REQUEST LATE NOTE    DENIES S & S  . Allergy   . Cataract   . History of bladder infections   . Wears glasses   . Macular degeneration    Past Surgical History  Procedure Laterality Date  . Right knee closed manipulation  09-11-2010  . Total knee arthroplasty  07-16-2010    RIGHT  . Right knee arthroscopy/ partial lateral menisectomy/ tricompartment chondroplasty/ decompression cyst  10-26-2009  . Right knee arthroscopy  X2  BEFORE 2011  . Total thyroidectomy  2009    CANCER  (POST-OP BLEED)  AND RADIATION TX  . Left thumb joint replacement  2005    RIGHT DONE IN 2004  . Right shoulder arthroscopy  2010  &  2004  . Bilateral elbow surg.  1999  . Bilateral carpal tunnel release  1994  . Vaginal hysterectomy  1990  . Bilateral salpingoophectomy  1993    POST-OP URETER REPAIR 12 DAYS AFTER   . Tonsillectomy  1968  . Tibia cyst removed and orif leg fx  1958  . Trigger fingers Right 2013    3rd and 4th fingers   Social History   Social History  . Marital Status: Married    Spouse Name: N/A  . Number of Children: N/A  . Years of Education: N/A  Social History Main Topics  . Smoking status: Never Smoker   . Smokeless tobacco: Never Used  . Alcohol Use: Yes     Comment: RARE  . Drug Use: No  . Sexual Activity: Not Asked   Other Topics Concern  . None   Social History Narrative   Family History  Problem Relation Age of Onset  . Colon cancer Neg Hx   . Colon polyps Sister 50  . Alzheimer's disease Mother   . Heart disease Father   . Cirrhosis Father   . Heart attack Father   . Macular degeneration Father    Allergies  Allergen Reactions  . Other Other (See Comments)    Artificial sweetners  . Sulfa Antibiotics Hives   Prior to Admission medications   Medication Sig Start Date End Date Taking? Authorizing Provider  aspirin 81 MG tablet Take 81 mg by mouth daily.   Yes Historical Provider,  MD  azelastine (ASTELIN) 137 MCG/SPRAY nasal spray Place 2 sprays into both nostrils 2 (two) times daily. Use in each nostril as directed 10/14/13  Yes [name], MD  blood glucose meter kit and supplies KIT Test blood sugar once daily. Dx code: E11.9 03/08/15  Yes [name], MD  cetirizine (ZYRTEC) 10 MG tablet Take 10 mg by mouth daily as needed for allergies.    Yes Historical Provider, MD  glucose blood test strip Test blood sugar once daily. Dx code: E11.9 03/08/15  Yes [name], MD  Lancets MISC Test blood sugar once daily. Dx code: E11.9 03/08/15  Yes [name], MD  levothyroxine (SYNTHROID, LEVOTHROID) 137 MCG tablet Take 137 mcg by mouth daily before breakfast.   Yes Historical Provider, MD  lidocaine (LIDODERM) 5 % APPLY 1 PATCH FOR 12 HOURS OUT OF 24 HOURS 01/17/14  Yes Mancel Bale, PA-C  losartan (COZAAR) 50 MG tablet TAKE 1 TABLET BY MOUTH ONCE DAILY 02/06/15  Yes [name], MD  metaxalone (SKELAXIN) 800 MG tablet Take 1 tablet (800 mg total) by mouth 3 (three) times daily. 04/25/15  Yes [name], MD  metFORMIN (GLUCOPHAGE) 1000 MG tablet TAKE 1 TABLET BY MOUTH 2 TIMES DAILY WITH A MEAL. 05/01/15  Yes [name], MD  metoprolol succinate (TOPROL-XL) 25 MG 24 hr tablet Take 12.5 mg by mouth every morning.   Yes Historical Provider, MD  mometasone [facility]) 220 MCG/INH inhaler Inhale 2 puffs into the lungs daily. 10/14/13  Yes [name], MD  Multiple Vitamins-Minerals (PRESERVISION AREDS 2 PO) Take by mouth 2 (two) times daily. Reported on 08/10/2015   Yes Historical Provider, MD  NASONEX 50 MCG/ACT nasal spray PLACE 2 SPRAYS INTO THE NOSE DAILY. 09/20/14  Yes [name], MD  nitrofurantoin, macrocrystal-monohydrate, (MACROBID) 100 MG capsule Take 100 mg by mouth 2 (two) times daily.   Yes Historical Provider, MD  ondansetron (ZOFRAN-ODT) 4 MG disintegrating tablet DISSOLVE 1 TABLET BY MOUTH EVERY 8 HOURS AS NEEDED FOR NAUSEA. 12/25/14  Yes Shawnee Knapp, MD    phenazopyridine (PYRIDIUM) 200 MG tablet Take 1 tablet (200 mg total) by mouth 3 (three) times daily as needed for pain. 06/28/14  Yes Chelle Jeffery, PA-C  traZODone (DESYREL) 100 MG tablet TAKE 1-1/2-2 TABLETS BY MOUTH AT BEDTIME 02/06/15  Yes [name], MD  zolpidem (AMBIEN) 5 MG tablet TAKE 1 TABLET BY MOUTH AT BEDTIME AS NEEDED FOR SLEEP 04/24/15  Yes [name], MD  Probiotic Product (PRO-BIOTIC BLEND) CAPS  Take by mouth. Reported on 08/10/2015    Historical Provider, MD  trimethoprim (TRIMPEX) 100 MG tablet Take 1 tablet (100 mg total) by mouth daily. Patient not taking: Reported on 08/10/2015 05/16/15   [name], MD     ROS: The patient denies fevers, chills, night sweats, unintentional weight loss, chest pain, palpitations, wheezing, dyspnea on exertion, melena, numbness, weakness, or tingling.   All other systems have been reviewed and were otherwise negative with the exception of those mentioned in the HPI and as above.    PHYSICAL EXAM: Filed Vitals:   08/10/15 1041  BP: 130/78  Pulse: 88  Temp: 97.9 F (36.6 C)  Resp: 20   Body mass index is 28.75 kg/(m^2).   General: Alert, no acute distress HEENT:  Normocephalic, atraumatic, oropharynx patent. Eye: Juliette Mangle [facility] Cardiovascular:  Regular rate and rhythm, no rubs murmurs or gallops.  No Carotid bruits, radial pulse intact. No pedal edema.  Respiratory: Clear to auscultation bilaterally.  No wheezes, rales, or rhonchi.  No cyanosis, no use of accessory musculature Abdominal: No organomegaly, abdomen is soft and non-tender, positive bowel sounds.  No masses. Musculoskeletal: Gait intact. No edema, tenderness Skin:  4 mm benign-appearing nevus at the base of the left earlobe. Other benign-appearing nevi noted at the top of the scalp. Neurologic: Facial musculature symmetric. Psychiatric: Patient acts appropriately throughout our interaction. Lymphatic: No cervical or submandibular lymphadenopathy Meds ordered  this encounter  Medications  . nitrofurantoin, macrocrystal-monohydrate, (MACROBID) 100 MG capsule    Sig: Take 100 mg by mouth 2 (two) times daily.  . nitrofurantoin, macrocrystal-monohydrate, (MACROBID) 100 MG capsule    Sig: Take 1 tablet by mouth daily.    Dispense:  30 capsule    Refill:  0  . famotidine (PEPCID) 40 MG tablet    Sig: Take 1 tablet (40 mg total) by mouth daily.    Dispense:  90 tablet    Refill:  3  . ondansetron (ZOFRAN) 4 MG tablet    Sig: Take 1 tablet (4 mg total) by mouth every 8 (eight) hours as needed for nausea or vomiting.    Dispense:  30 tablet    Refill:  1     LABS:    EKG/XRAY:   Primary read interpreted by Dr. Everlene Farrier at [facility].   ASSESSMENT/PLAN:   I expressed the patient I was very concerned about Macrobid on a daily basis. I did agree she could take the medicine every other day. Referral placed to Dr. Elenora Gamma for his reevaluation. I did a baseline pulmonary function test which was normal. Chest x-ray done this summer showed some basilar atelectasis but otherwise negative. Pepcid and Zofran were also refilled.  Gross sideeffects, risk and benefits, and alternatives of medications d/w patient. Patient is aware that all medications have potential sideeffects and we are unable to predict every sideeffect or drug-drug interaction that may occur.  [name] MD 08/10/2015 11:00 AM

## 2015-08-17 MED FILL — metFORMIN HCL 1000 MG TABS: 1000 | 90 days supply | Qty: 180 | Fill #1

## 2015-09-06 DIAGNOSIS — Z87440 Personal history of urinary (tract) infections: Secondary | ICD-10-CM | POA: Diagnosis not present

## 2015-09-06 DIAGNOSIS — Z0000 Encounter for general adult medical examination without abnormal findings: Secondary | ICD-10-CM | POA: Diagnosis not present

## 2015-09-06 MED FILL — NITROFURANTOIN MCR 50 MG CA: 50 | 30 days supply | Qty: 30 | Fill #0

## 2015-09-10 MED FILL — ZOLPIDEM TARTRATE 5 MG TAB: 5 | 30 days supply | Qty: 30 | Fill #4

## 2015-09-14 ENCOUNTER — Telehealth: Payer: Self-pay | Admitting: Physician Assistant

## 2015-09-14 DIAGNOSIS — J309 Allergic rhinitis, unspecified: Secondary | ICD-10-CM

## 2015-09-14 MED ORDER — MONTELUKAST SODIUM 10 MG PO TABS: 10.0000 mg | ORAL_TABLET | Freq: Every day | ORAL | Status: DC

## 2015-09-14 MED FILL — MONTELUKAST SOD 10 MG TAB: 10 | 30 days supply | Qty: 30 | Fill #0

## 2015-09-14 MED FILL — TRUE METRIX GLUCOSE TEST ST: 90 days supply | Qty: 100 | Fill #2

## 2015-09-14 NOTE — Telephone Encounter (Signed)
Worsening allergic symptoms, esp at night. flonase and astelin nasal sprays do not seem to help. She wants to try singulair to see if helpful. Sent to pharmacy.

## 2015-09-26 MED FILL — METOPROLOL SUCC ER 25 MG TA: 25 | 90 days supply | Qty: 45 | Fill #2

## 2015-09-26 MED FILL — LOSARTAN POTASSIUM 50 MG TA: 50 | 90 days supply | Qty: 90 | Fill #0

## 2015-09-26 MED FILL — METAXALONE 800 MG TABLET: 800 | 30 days supply | Qty: 90 | Fill #1

## 2015-10-09 DIAGNOSIS — M4316 Spondylolisthesis, lumbar region: Secondary | ICD-10-CM | POA: Diagnosis not present

## 2015-10-09 DIAGNOSIS — Z6829 Body mass index (BMI) 29.0-29.9, adult: Secondary | ICD-10-CM | POA: Diagnosis not present

## 2015-10-09 DIAGNOSIS — M545 Low back pain: Secondary | ICD-10-CM | POA: Diagnosis not present

## 2015-10-10 MED FILL — MONTELUKAST SOD 10 MG TAB: 10 | 30 days supply | Qty: 30 | Fill #1

## 2015-10-10 MED FILL — NITROFURANTOIN MCR 50 MG CA: 50 | 90 days supply | Qty: 90 | Fill #1

## 2015-10-10 MED FILL — ZOLPIDEM TARTRATE 5 MG TAB: 5 | 30 days supply | Qty: 30 | Fill #5

## 2015-10-17 MED FILL — LEVOTHYROXINE 137 MCG TAB: 137 | 90 days supply | Qty: 90 | Fill #2

## 2015-10-22 ENCOUNTER — Ambulatory Visit (INDEPENDENT_AMBULATORY_CARE_PROVIDER_SITE_OTHER): Payer: 59 | Admitting: Emergency Medicine

## 2015-10-22 VITALS — BP 128/70 | HR 82 | Temp 98.2°F | Resp 20 | Ht 67.32 in | Wt 186.2 lb

## 2015-10-22 DIAGNOSIS — R739 Hyperglycemia, unspecified: Secondary | ICD-10-CM

## 2015-10-22 DIAGNOSIS — N390 Urinary tract infection, site not specified: Secondary | ICD-10-CM

## 2015-10-22 DIAGNOSIS — G4700 Insomnia, unspecified: Secondary | ICD-10-CM | POA: Diagnosis not present

## 2015-10-22 DIAGNOSIS — G43001 Migraine without aura, not intractable, with status migrainosus: Secondary | ICD-10-CM | POA: Diagnosis not present

## 2015-10-22 LAB — GLUCOSE, POCT (MANUAL RESULT ENTRY): POC GLUCOSE: 136 mg/dL — AB (ref 70–99)

## 2015-10-22 LAB — POCT GLYCOSYLATED HEMOGLOBIN (HGB A1C): HEMOGLOBIN A1C: 6.5

## 2015-10-22 MED ORDER — ELETRIPTAN HYDROBROMIDE 20 MG PO TABS: 20.0000 mg | ORAL_TABLET | ORAL | Status: DC | PRN

## 2015-10-22 MED ORDER — ZOLPIDEM TARTRATE 5 MG PO TABS: 5.0000 mg | ORAL_TABLET | Freq: Every evening | ORAL | Status: DC | PRN

## 2015-10-22 NOTE — Progress Notes (Addendum)
Patient ID: Debbie Hayes, female   DOB: 03-13-1951, 65 y.o.   MRN: [id number]     By signing my name below, I, Debbie Hayes, attest that this documentation has been prepared under the direction and in the presence of [name], MD.  Electronically Signed: Zola Hayes, Medical Scribe. 10/22/2015. 2:09 PM.   Chief Complaint:  Chief Complaint  Patient presents with  . Follow-up    Meds    HPI: Debbie Hayes is a 65 y.o. female who reports to [facility] today for a follow-up. Her diabetes is under good control. She has been checking her sugar is running between 120 and 140. She has had back surgery with fair results. She is requesting a prescription for Relpax. Use sparingly as needed for her headaches. She understands the risks about this. She also needs a refill on her Ambien. She has been to Dr. Elenora Gamma for her chronic UTIs and will continue on Macrodantin every other day.   Past Medical History  Diagnosis Date  . Rotator cuff disorder LEFT SHOULDER RTC IMPINGMENT  . Hyperlipidemia   . Normal nuclear stress test 01-25-2009  . Disorder of inner ear CAUSES VERTIGO OCCASIONALLY  . Chronic migraine BOTOX INJECTION EVERY 3 MONTHS  . Fibromyalgia   . Hemorrhoid   . Constipation   . Spondylolisthesis of lumbar region   . GERD (gastroesophageal reflux disease)   . Hypothyroidism   . History of thyroid cancer 2009  S/P TOTAL THYROIDECTOMY AND RADIATION  . Diabetes mellitus   . 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 Insomnia   . PONV (postoperative nausea and vomiting) SEVERE  . Chronic pain in right shoulder   . Left shoulder pain   . OA (osteoarthritis) JOINTS  . Hypertension CARDIOLOGIST- DR Wynonia Lawman- WILL REQUEST LATE NOTE    DENIES S & S  . Allergy   . Cataract   . History of bladder infections   . Wears glasses   . Macular degeneration    Past Surgical History  Procedure Laterality Date  . Right knee closed manipulation  09-11-2010  . Total knee arthroplasty   07-16-2010    RIGHT  . Right knee arthroscopy/ partial lateral menisectomy/ tricompartment chondroplasty/ decompression cyst  10-26-2009  . Right knee arthroscopy  X2  BEFORE 2011  . Total thyroidectomy  2009    CANCER  (POST-OP BLEED)  AND RADIATION TX  . Left thumb joint replacement  2005    RIGHT DONE IN 2004  . Right shoulder arthroscopy  2010  &  2004  . Bilateral elbow surg.  1999  . Bilateral carpal tunnel release  1994  . Vaginal hysterectomy  1990  . Bilateral salpingoophectomy  1993    POST-OP URETER REPAIR 12 DAYS AFTER   . Tonsillectomy  1968  . Tibia cyst removed and orif leg fx  1958  . Trigger fingers Right 2013    3rd and 4th fingers   Social History   Social History  . Marital Status: Married    Spouse Name: N/A  . Number of Children: N/A  . Years of Education: N/A   Social History Main Topics  . Smoking status: Never Smoker   . Smokeless tobacco: Never Used  . Alcohol Use: Yes     Comment: RARE  . Drug Use: No  . Sexual Activity: Not Asked   Other Topics Concern  . None   Social History Narrative   Family History  Problem Relation Age of Onset  .  Colon cancer Neg Hx   . Colon polyps Sister 42  . Alzheimer's disease Mother   . Heart disease Father   . Cirrhosis Father   . Heart attack Father   . Macular degeneration Father    Allergies  Allergen Reactions  . Other Other (See Comments)    Artificial sweetners  . Sulfa Antibiotics Hives   Prior to Admission medications   Medication Sig Start Date End Date Taking? Authorizing Provider  aspirin 81 MG tablet Take 81 mg by mouth daily.   Yes Historical Provider, MD  azelastine (ASTELIN) 137 MCG/SPRAY nasal spray Place 2 sprays into both nostrils 2 (two) times daily. Use in each nostril as directed 10/14/13  Yes [name], MD  blood glucose meter kit and supplies KIT Test blood sugar once daily. Dx code: E11.9 03/08/15  Yes [name], MD  cetirizine (ZYRTEC) 10 MG tablet Take 10 mg by mouth  daily as needed for allergies.    Yes Historical Provider, MD  famotidine (PEPCID) 40 MG tablet Take 1 tablet (40 mg total) by mouth daily. 08/10/15  Yes [name], MD  glucose blood test strip Test blood sugar once daily. Dx code: E11.9 03/08/15  Yes [name], MD  Lancets MISC Test blood sugar once daily. Dx code: E11.9 03/08/15  Yes [name], MD  levothyroxine (SYNTHROID, LEVOTHROID) 137 MCG tablet Take 137 mcg by mouth daily before breakfast.   Yes Historical Provider, MD  lidocaine (LIDODERM) 5 % APPLY 1 PATCH FOR 12 HOURS OUT OF 24 HOURS 01/17/14  Yes Mancel Bale, PA-C  losartan (COZAAR) 50 MG tablet TAKE 1 TABLET BY MOUTH ONCE DAILY 02/06/15  Yes [name], MD  metaxalone (SKELAXIN) 800 MG tablet Take 1 tablet (800 mg total) by mouth 3 (three) times daily. 04/25/15  Yes [name], MD  metFORMIN (GLUCOPHAGE) 1000 MG tablet TAKE 1 TABLET BY MOUTH 2 TIMES DAILY WITH A MEAL. 05/01/15  Yes [name], MD  metoprolol succinate (TOPROL-XL) 25 MG 24 hr tablet Take 12.5 mg by mouth every morning.   Yes Historical Provider, MD  mometasone [facility]) 220 MCG/INH inhaler Inhale 2 puffs into the lungs daily. 10/14/13  Yes [name], MD  montelukast (SINGULAIR) 10 MG tablet Take 1 tablet (10 mg total) by mouth at bedtime. 09/14/15  Yes Bennett Scrape V, PA-C  Multiple Vitamins-Minerals (PRESERVISION AREDS 2 PO) Take by mouth 2 (two) times daily. Reported on 10/22/2015   Yes Historical Provider, MD  NASONEX 50 MCG/ACT nasal spray PLACE 2 SPRAYS INTO THE NOSE DAILY. 09/20/14  Yes [name], MD  nitrofurantoin (MACRODANTIN) 50 MG capsule Take 50 mg by mouth 1 day or 1 dose.   Yes Historical Provider, MD  nitrofurantoin, macrocrystal-monohydrate, (MACROBID) 100 MG capsule Take 100 mg by mouth 2 (two) times daily.   Yes Historical Provider, MD  nitrofurantoin, macrocrystal-monohydrate, (MACROBID) 100 MG capsule Take 1 tablet by mouth daily. 08/10/15  Yes [name], MD  ondansetron (ZOFRAN)  4 MG tablet Take 1 tablet (4 mg total) by mouth every 8 (eight) hours as needed for nausea or vomiting. 08/10/15  Yes [name], MD  ondansetron (ZOFRAN-ODT) 4 MG disintegrating tablet DISSOLVE 1 TABLET BY MOUTH EVERY 8 HOURS AS NEEDED FOR NAUSEA. 12/25/14  Yes Shawnee Knapp, MD  phenazopyridine (PYRIDIUM) 200 MG tablet Take 1 tablet (200 mg total) by mouth 3 (three) times daily as needed for pain. 06/28/14  Yes  Chelle Jeffery, PA-C  Probiotic Product (PRO-BIOTIC BLEND) CAPS Take by mouth. Reported on 08/10/2015   Yes Historical Provider, MD  traZODone (DESYREL) 100 MG tablet TAKE 1-1/2-2 TABLETS BY MOUTH AT BEDTIME 02/06/15  Yes [name], MD  zolpidem (AMBIEN) 5 MG tablet TAKE 1 TABLET BY MOUTH AT BEDTIME AS NEEDED FOR SLEEP 04/24/15  Yes [name], MD  trimethoprim (TRIMPEX) 100 MG tablet Take 1 tablet (100 mg total) by mouth daily. Patient not taking: Reported on 08/10/2015 05/16/15   [name], MD     ROS: The patient denies fevers, chills, night sweats, unintentional weight loss, chest pain, palpitations, wheezing, dyspnea on exertion, nausea, vomiting, abdominal pain, dysuria, hematuria, melena, numbness, weakness, or tingling.   All other systems have been reviewed and were otherwise negative with the exception of those mentioned in the HPI and as above.    PHYSICAL EXAM: Filed Vitals:   10/22/15 1348  BP: 128/70  Pulse: 82  Temp: 98.2 F (36.8 C)  Resp: 20   Body mass index is 28.88 kg/(m^2).   General: Alert, no acute distress HEENT:  Normocephalic, atraumatic, oropharynx patent. Eye: Juliette Mangle [facility] Cardiovascular:  Regular rate and rhythm, no rubs murmurs or gallops.  No Carotid bruits, radial pulse intact. No pedal edema.  Respiratory: Clear to auscultation bilaterally.  No wheezes, rales, or rhonchi.  No cyanosis, no use of accessory musculature Abdominal: No organomegaly, abdomen is soft and non-tender, positive bowel sounds.  No masses. Musculoskeletal: Gait  intact. No edema, tenderness Skin: No rashes. Neurologic: Facial musculature symmetric. Psychiatric: Patient acts appropriately throughout our interaction. Lymphatic: No cervical or submandibular lymphadenopathy    LABS: Results for orders placed or performed in visit on 10/22/15  POCT glycosylated hemoglobin (Hb A1C)  Result Value Ref Range   Hemoglobin A1C 6.5   POCT glucose (manual entry)  Result Value Ref Range   POC Glucose 136 (A) 70 - 99 mg/dl     EKG/XRAY:   Primary read interpreted by Dr. Everlene Farrier at [facility]   ASSESSMENT/PLAN: Diabetes under great control. No changes here. Her Ambien was refilled. She takes 1/2-1 at bedtime. She was given Relpax 20 mg to take if absolutely necessary for bad migraine. She understands the risks associated with this medication. She will continue on low-dose Macrodantin at bedtime.I personally performed the services described in this documentation, which was scribed in my presence. The recorded information has been reviewed and is accurate.    Gross sideeffects, risk and benefits, and alternatives of medications d/w patient. Patient is aware that all medications have potential sideeffects and we are unable to predict every sideeffect or drug-drug interaction that may occur.  [name] MD 10/22/2015 2:09 PM

## 2015-10-22 NOTE — Patient Instructions (Signed)
     IF you received an x-ray today, you will receive an invoice from [facility]. Please contact [facility] at [phone] with questions or concerns regarding your invoice.   IF you received labwork today, you will receive an invoice from [facility]. Please contact [facility] at [phone] with questions or concerns regarding your invoice.   Our billing staff will not be able to assist you with questions regarding bills from these companies.  You will be contacted with the lab results as soon as they are available. The fastest way to get your results is to activate your My Chart account. Instructions are located on the last page of this paperwork. If you have not heard from us regarding the results in 2 weeks, please contact this office.      

## 2015-10-23 ENCOUNTER — Encounter (HOSPITAL_COMMUNITY): Payer: Self-pay

## 2015-10-23 ENCOUNTER — Emergency Department (HOSPITAL_COMMUNITY)
Admission: EM | Admit: 2015-10-23 | Discharge: 2015-10-24 | Disposition: A | Discharge status: Home / Self Care | Payer: 59 | Attending: Emergency Medicine | Admitting: Emergency Medicine

## 2015-10-23 ENCOUNTER — Emergency Department (HOSPITAL_COMMUNITY): Payer: 59

## 2015-10-23 DIAGNOSIS — Z7984 Long term (current) use of oral hypoglycemic drugs: Secondary | ICD-10-CM | POA: Insufficient documentation

## 2015-10-23 DIAGNOSIS — I10 Essential (primary) hypertension: Secondary | ICD-10-CM | POA: Diagnosis not present

## 2015-10-23 DIAGNOSIS — R200 Anesthesia of skin: Secondary | ICD-10-CM | POA: Diagnosis not present

## 2015-10-23 DIAGNOSIS — Z79899 Other long term (current) drug therapy: Secondary | ICD-10-CM | POA: Diagnosis not present

## 2015-10-23 DIAGNOSIS — E785 Hyperlipidemia, unspecified: Secondary | ICD-10-CM | POA: Insufficient documentation

## 2015-10-23 DIAGNOSIS — Z7982 Long term (current) use of aspirin: Secondary | ICD-10-CM | POA: Diagnosis not present

## 2015-10-23 DIAGNOSIS — R209 Unspecified disturbances of skin sensation: Secondary | ICD-10-CM | POA: Diagnosis not present

## 2015-10-23 DIAGNOSIS — I639 Cerebral infarction, unspecified: Secondary | ICD-10-CM | POA: Insufficient documentation

## 2015-10-23 DIAGNOSIS — G43709 Chronic migraine without aura, not intractable, without status migrainosus: Secondary | ICD-10-CM | POA: Insufficient documentation

## 2015-10-23 DIAGNOSIS — K219 Gastro-esophageal reflux disease without esophagitis: Secondary | ICD-10-CM | POA: Insufficient documentation

## 2015-10-23 DIAGNOSIS — G8929 Other chronic pain: Secondary | ICD-10-CM | POA: Insufficient documentation

## 2015-10-23 LAB — DIFFERENTIAL
Basophils Absolute: 0 10*3/uL (ref 0.0–0.1)
Basophils Relative: 0 %
EOS PCT: 3 %
Eosinophils Absolute: 0.2 10*3/uL (ref 0.0–0.7)
LYMPHS ABS: 2.1 10*3/uL (ref 0.7–4.0)
LYMPHS PCT: 35 %
MONO ABS: 1 10*3/uL (ref 0.1–1.0)
MONOS PCT: 17 %
NEUTROS ABS: 2.7 10*3/uL (ref 1.7–7.7)
Neutrophils Relative %: 45 %

## 2015-10-23 LAB — COMPREHENSIVE METABOLIC PANEL
ALK PHOS: 64 U/L (ref 38–126)
ALT: 18 U/L (ref 14–54)
ANION GAP: 13 (ref 5–15)
AST: 24 U/L (ref 15–41)
Albumin: 4 g/dL (ref 3.5–5.0)
BILIRUBIN TOTAL: 0.5 mg/dL (ref 0.3–1.2)
BUN: 12 mg/dL (ref 6–20)
CALCIUM: 9.4 mg/dL (ref 8.9–10.3)
CO2: 25 mmol/L (ref 22–32)
CREATININE: 0.67 mg/dL (ref 0.44–1.00)
Chloride: 103 mmol/L (ref 101–111)
GFR calc non Af Amer: >60 mL/min (ref >60)
Glucose, Bld: 185 mg/dL — ABNORMAL HIGH (ref 65–99)
Potassium: 3.9 mmol/L (ref 3.5–5.1)
Sodium: 141 mmol/L (ref 135–145)
TOTAL PROTEIN: 6.2 g/dL — AB (ref 6.5–8.1)

## 2015-10-23 LAB — PROTIME-INR
INR: 0.96 (ref 0.00–1.49)
PROTHROMBIN TIME: 13 s (ref 11.6–15.2)

## 2015-10-23 LAB — CBC
HEMATOCRIT: 41.2 % (ref 36.0–46.0)
HEMOGLOBIN: 13.1 g/dL (ref 12.0–15.0)
MCH: 27.2 pg (ref 26.0–34.0)
MCHC: 31.8 g/dL (ref 30.0–36.0)
MCV: 85.5 fL (ref 78.0–100.0)
Platelets: 204 10*3/uL (ref 150–400)
RBC: 4.82 MIL/uL (ref 3.87–5.11)
RDW: 13.9 % (ref 11.5–15.5)
WBC: 6 10*3/uL (ref 4.0–10.5)

## 2015-10-23 LAB — I-STAT CHEM 8, ED
BUN: 16 mg/dL (ref 6–20)
CALCIUM ION: 1.13 mmol/L (ref 1.13–1.30)
CHLORIDE: 100 mmol/L — AB (ref 101–111)
CREATININE: 0.6 mg/dL (ref 0.44–1.00)
GLUCOSE: 173 mg/dL — AB (ref 65–99)
HCT: 43 % (ref 36.0–46.0)
Hemoglobin: 14.6 g/dL (ref 12.0–15.0)
Potassium: 3.7 mmol/L (ref 3.5–5.1)
SODIUM: 140 mmol/L (ref 135–145)
TCO2: 27 mmol/L (ref 0–100)

## 2015-10-23 LAB — URINALYSIS, ROUTINE W REFLEX MICROSCOPIC
BILIRUBIN URINE: NEGATIVE
Glucose, UA: NEGATIVE mg/dL
Hgb urine dipstick: NEGATIVE
KETONES UR: NEGATIVE mg/dL
LEUKOCYTES UA: NEGATIVE
NITRITE: NEGATIVE
PH: 5.5 (ref 5.0–8.0)
Protein, ur: NEGATIVE mg/dL
SPECIFIC GRAVITY, URINE: 1.006 (ref 1.005–1.030)

## 2015-10-23 LAB — RAPID URINE DRUG SCREEN, HOSP PERFORMED
AMPHETAMINES: NOT DETECTED
Barbiturates: NOT DETECTED
Benzodiazepines: NOT DETECTED
Cocaine: NOT DETECTED
OPIATES: NOT DETECTED
TETRAHYDROCANNABINOL: NOT DETECTED

## 2015-10-23 LAB — I-STAT TROPONIN, ED: Troponin i, poc: 0 ng/mL (ref 0.00–0.08)

## 2015-10-23 LAB — APTT: aPTT: 29 seconds (ref 24–37)

## 2015-10-23 LAB — ETHANOL: Alcohol, Ethyl (B): <5 mg/dL (ref <5)

## 2015-10-23 NOTE — Consult Note (Signed)
Neurology Consultation Reason for Consult: Facial numbness Referring Physician: Tyrone Nine, D  CC: Facial numbness  History is obtained from: Patient  HPI: Debbie Hayes is a 65 y.o. female history of migraines who presents with left facial numbness that started around tenderness to 7. She states that initially started in the left temporal region and then spread down the left side of her face to include left side of her tongue. She states that there is a burning-like sensation in addition to numbness. This has been persistent for approximately 2 hours, but has had some improvement.  Of note, 4 years ago she had symptoms that were very similar in character, though only involved the left upper lip area. She states that the feeling, however was quite similar. This would come and go and recurred multiple times over the course of several months. She has worked up including MRI and no cause was found for this.  She does have chronic migraine, and is photophobic on a daily basis, but does not feel that she currently has a migraine. She did not feel that the previous episodes were associated with migraine either.  LKW: 6:50 pm tpa given?: no, mild symptoms.    ROS: A 14 point ROS was performed and is negative except as noted in the HPI.   Past Medical History  Diagnosis Date  . Rotator cuff disorder LEFT SHOULDER RTC IMPINGMENT  . Hyperlipidemia   . Normal nuclear stress test 01-25-2009  . Disorder of inner ear CAUSES VERTIGO OCCASIONALLY  . Chronic migraine BOTOX INJECTION EVERY 3 MONTHS  . Fibromyalgia   . Hemorrhoid   . Constipation   . Spondylolisthesis of lumbar region   . GERD (gastroesophageal reflux disease)   . Hypothyroidism   . History of thyroid cancer 2009  S/P TOTAL THYROIDECTOMY AND RADIATION  . Diabetes mellitus   . 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 Insomnia   . PONV (postoperative nausea and vomiting) SEVERE  . Chronic pain in right shoulder   . Left shoulder  pain   . OA (osteoarthritis) JOINTS  . Hypertension CARDIOLOGIST- DR Wynonia Lawman- WILL REQUEST LATE NOTE    DENIES S & S  . Allergy   . Cataract   . History of bladder infections   . Wears glasses   . Macular degeneration      Family History  Problem Relation Age of Onset  . Colon cancer Neg Hx   . Colon polyps Sister 20  . Alzheimer's disease Mother   . Heart disease Father   . Cirrhosis Father   . Heart attack Father   . Macular degeneration Father      Social History:  reports that she has never smoked. She has never used smokeless tobacco. She reports that she drinks alcohol. She reports that she does not use illicit drugs.   Exam: Current vital signs:    Vital signs in last 24 hours:     Physical Exam  Constitutional: Appears well-developed and well-nourished.  Psych: Affect appropriate to situation Eyes: No scleral injection HENT: No OP obstrucion Head: Normocephalic.  Cardiovascular: Normal rate and regular rhythm.  Respiratory: Effort normal and breath sounds normal to anterior ascultation GI: Soft.  No distension. There is no tenderness.  Skin: WDI  Neuro: Mental Status: Patient is awake, alert, oriented to person, place, month, year, and situation. Patient is able to give a clear and coherent history. No signs of aphasia or neglect Cranial Nerves: II: Visual Fields are full. Pupils are  equal, round, and reactive to light.   III,IV, VI: EOMI without ptosis or diploplia.  V: Facial sensation is symmetric to pin, mild difference to light touch. VII: Facial movement is symmetric.  VIII: hearing is intact to voice X: Uvula elevates symmetrically XI: Shoulder shrug is symmetric. XII: tongue is midline without atrophy or fasciculations.  Motor: Tone is normal. Bulk is normal. No drift in any extremities, good apparent strength throughout. Sensory: Sensation is symmetric to light touch and temperature in the arms and legs. Deep Tendon Reflexes: 2+ and  symmetric in the biceps and patellae.  Plantars: Toes are downgoing bilaterally.  Cerebellar: FNF and HKS are intact bilaterally Gait: Stable casual gait         I have reviewed labs in epic and the results pertinent to this consultation are: Chem 8 mildly elevated glucose  I have reviewed the images obtained: CT head-unremarkable  Impression: 65 year old female with progressive paresthesia migrating from the left upper portion of her face to the entire left face. This is associated with positive symptoms (burning). These characteristics make me think that this is less likely TIA/stroke and more likely possible migraine aura without headache. Abnormal trigeminal neuralgia could also be possible but I feels much less likely given the lack of pain with either the current episode or the previous.  Currently I do think it is reasonable to evaluate with MRI, however if MRI is negative and I do not think that I would favor further aggressive workup/intervention for TIA. She is already taking antiplatelet therapy, seeking treatment for diabetes, pursuing management for hyperlipidemia.  Recommendations: 1) MRI brain/MRA head and neck 2) if negative, then no further workup at this time. 3) if there is infarct then would pursue stroke workup as an inpatient.   Roland Rack, MD [facility] [phone]  If 7pm- 7am, please page neurology on call as listed in [place].

## 2015-10-23 NOTE — ED Provider Notes (Signed)
CSN: [id number]     Arrival date & time 10/23/15  2112 History   First MD Initiated Contact with Patient 10/23/15 2140     Chief Complaint  Patient presents with  . Code Stroke     (Consider location/radiation/quality/duration/timing/severity/associated sxs/prior Treatment) Patient is a 65 y.o. female presenting with neurologic complaint. The history is provided by the patient and the spouse.  Neurologic Problem This is a recurrent (Left sided facial numbness) problem. The current episode started today (started at 6:50pm). The problem occurs constantly. The problem has been gradually improving. Associated symptoms include numbness. Pertinent negatives include no abdominal pain, arthralgias, chest pain, chills, coughing, diaphoresis, fatigue, fever, headaches (patient denies current headache), joint swelling, myalgias, nausea, rash, sore throat, urinary symptoms, vomiting or weakness. Exacerbated by: unknown.    Past Medical History  Diagnosis Date  . Rotator cuff disorder LEFT SHOULDER RTC IMPINGMENT  . Hyperlipidemia   . Normal nuclear stress test 01-25-2009  . Disorder of inner ear CAUSES VERTIGO OCCASIONALLY  . Chronic migraine BOTOX INJECTION EVERY 3 MONTHS  . Fibromyalgia   . Hemorrhoid   . Constipation   . Spondylolisthesis of lumbar region   . GERD (gastroesophageal reflux disease)   . Hypothyroidism   . History of thyroid cancer 2009  S/P TOTAL THYROIDECTOMY AND RADIATION  . Diabetes mellitus   . 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 Insomnia   . PONV (postoperative nausea and vomiting) SEVERE  . Chronic pain in right shoulder   . Left shoulder pain   . OA (osteoarthritis) JOINTS  . Hypertension CARDIOLOGIST- DR Wynonia Lawman- WILL REQUEST LATE NOTE    DENIES S & S  . Allergy   . Cataract   . History of bladder infections   . Wears glasses   . Macular degeneration    Past Surgical History  Procedure Laterality Date  . Right knee closed manipulation  09-11-2010  .  Total knee arthroplasty  07-16-2010    RIGHT  . Right knee arthroscopy/ partial lateral menisectomy/ tricompartment chondroplasty/ decompression cyst  10-26-2009  . Right knee arthroscopy  X2  BEFORE 2011  . Total thyroidectomy  2009    CANCER  (POST-OP BLEED)  AND RADIATION [place]  . Left thumb joint replacement  2005    RIGHT DONE IN 2004  . Right shoulder arthroscopy  2010  &  2004  . Bilateral elbow surg.  1999  . Bilateral carpal tunnel release  1994  . Vaginal hysterectomy  1990  . Bilateral salpingoophectomy  1993    POST-OP URETER REPAIR 12 DAYS AFTER   . Tonsillectomy  1968  . Tibia cyst removed and orif leg fx  1958  . Trigger fingers Right 2013    3rd and 4th fingers   Family History  Problem Relation Age of Onset  . Colon cancer Neg Hx   . Colon polyps Sister 33  . Alzheimer's disease Mother   . Heart disease Father   . Cirrhosis Father   . Heart attack Father   . Macular degeneration Father    Social History  Substance Use Topics  . Smoking status: Never Smoker   . Smokeless tobacco: Never Used  . Alcohol Use: Yes     Comment: RARE   OB History    No data available     Review of Systems  Constitutional: Negative for fever, chills, diaphoresis, activity change, appetite change and fatigue.  HENT: Negative for facial swelling, rhinorrhea, sore throat, trouble swallowing and voice change.  Eyes: Negative for pain and visual disturbance.  Respiratory: Negative for cough, shortness of breath, wheezing and stridor.   Cardiovascular: Negative for chest pain, palpitations and leg swelling.  Gastrointestinal: Negative for nausea, vomiting, abdominal pain, constipation and anal bleeding.  Endocrine: Negative.   Genitourinary: Negative for dysuria, vaginal bleeding, vaginal discharge and vaginal pain.  Musculoskeletal: Negative for myalgias, back pain, joint swelling and arthralgias.  Skin: Negative.  Negative for rash.  Allergic/Immunologic: Negative.    Neurological: Positive for numbness. Negative for dizziness, tremors, syncope, weakness and headaches (patient denies current headache).  Psychiatric/Behavioral: Negative for suicidal ideas, sleep disturbance and self-injury.  All other systems reviewed and are negative.     Allergies  Other and Sulfa antibiotics  Home Medications   Prior to Admission medications   Medication Sig Start Date End Date Taking? Authorizing Provider  aspirin 81 MG tablet Take 81 mg by mouth daily.   Yes Historical Provider, MD  azelastine (ASTELIN) 137 MCG/SPRAY nasal spray Place 2 sprays into both nostrils 2 (two) times daily. Use in each nostril as directed 10/14/13  Yes [name], MD  cetirizine (ZYRTEC) 10 MG tablet Take 10 mg by mouth daily as needed for allergies.    Yes Historical Provider, MD  famotidine (PEPCID) 40 MG tablet Take 1 tablet (40 mg total) by mouth daily. 08/10/15  Yes [name], MD  levothyroxine (SYNTHROID, LEVOTHROID) 137 MCG tablet Take 137 mcg by mouth daily before breakfast.   Yes Historical Provider, MD  lidocaine (LIDODERM) 5 % Place 1 patch onto the skin as needed (for pain). Remove & Discard patch within 12 hours or as directed by MD   Yes Historical Provider, MD  losartan (COZAAR) 50 MG tablet Take 25 mg by mouth daily.   Yes Historical Provider, MD  metaxalone (SKELAXIN) 800 MG tablet Take 1 tablet (800 mg total) by mouth 3 (three) times daily. Patient taking differently: Take 800 mg by mouth 3 (three) times daily as needed for muscle spasms.  04/25/15  Yes [name], MD  metFORMIN (GLUCOPHAGE) 1000 MG tablet TAKE 1 TABLET BY MOUTH 2 TIMES DAILY WITH A MEAL. 05/01/15  Yes [name], MD  Methylcellulose, Laxative, (CITRUCEL PO) Take 6 tablets by mouth 2 (two) times daily.   Yes Historical Provider, MD  metoprolol succinate (TOPROL-XL) 25 MG 24 hr tablet Take 12.5 mg by mouth every morning.   Yes Historical Provider, MD  montelukast (SINGULAIR) 10 MG tablet Take 1  tablet (10 mg total) by mouth at bedtime. 09/14/15  Yes Bennett Scrape V, PA-C  Multiple Vitamins-Minerals (PRESERVISION AREDS 2 PO) Take 1 tablet by mouth 2 (two) times daily. Reported on 10/22/2015   Yes Historical Provider, MD  nitrofurantoin (MACRODANTIN) 50 MG capsule Take 50 mg by mouth at bedtime.    Yes Historical Provider, MD  Probiotic Product (PRO-BIOTIC BLEND) CAPS Take 1 capsule by mouth daily. Reported on 08/10/2015   Yes Historical Provider, MD  traZODone (DESYREL) 100 MG tablet TAKE 1-1/2-2 TABLETS BY MOUTH AT BEDTIME 02/06/15  Yes [name], MD  TURMERIC PO Take 1 tablet by mouth 2 (two) times daily.   Yes Historical Provider, MD  zolpidem (AMBIEN) 5 MG tablet Take 1 tablet (5 mg total) by mouth at bedtime as needed. for sleep Patient taking differently: Take 2.5 mg by mouth at bedtime as needed for sleep. for sleep 10/22/15  Yes [name], MD  blood glucose meter kit and supplies KIT Test blood sugar  once daily. Dx code: E11.9 03/08/15   [name], MD  eletriptan (RELPAX) 20 MG tablet Take 1 tablet (20 mg total) by mouth as needed for migraine or headache. May repeat in 2 hours if headache persists or recurs. 10/22/15   [name], MD  glucose blood test strip Test blood sugar once daily. Dx code: E11.9 03/08/15   [name], MD  Lancets MISC Test blood sugar once daily. Dx code: E11.9 03/08/15   [name], MD  lidocaine (LIDODERM) 5 % APPLY 1 PATCH FOR 12 HOURS OUT OF 24 HOURS Patient not taking: Reported on 10/23/2015 01/17/14   Mancel Bale, PA-C  losartan (COZAAR) 50 MG tablet TAKE 1 TABLET BY MOUTH ONCE DAILY Patient not taking: Reported on 10/23/2015 02/06/15   [name], MD  mometasone [facility]) 220 MCG/INH inhaler Inhale 2 puffs into the lungs daily. Patient not taking: Reported on 10/23/2015 10/14/13   [name], MD  NASONEX 50 MCG/ACT nasal spray PLACE 2 SPRAYS INTO THE NOSE DAILY. Patient not taking: Reported on 10/23/2015 09/20/14   [name], MD   nitrofurantoin, macrocrystal-monohydrate, (MACROBID) 100 MG capsule Take 1 tablet by mouth daily. Patient not taking: Reported on 10/23/2015 08/10/15   [name], MD  ondansetron (ZOFRAN) 4 MG tablet Take 1 tablet (4 mg total) by mouth every 8 (eight) hours as needed for nausea or vomiting. Patient not taking: Reported on 10/23/2015 08/10/15   [name], MD  ondansetron (ZOFRAN-ODT) 4 MG disintegrating tablet DISSOLVE 1 TABLET BY MOUTH EVERY 8 HOURS AS NEEDED FOR NAUSEA. Patient not taking: Reported on 10/23/2015 12/25/14   Shawnee Knapp, MD  phenazopyridine (PYRIDIUM) 200 MG tablet Take 1 tablet (200 mg total) by mouth 3 (three) times daily as needed for pain. Patient not taking: Reported on 10/23/2015 06/28/14   Harrison Mons, PA-C  trimethoprim (TRIMPEX) 100 MG tablet Take 1 tablet (100 mg total) by mouth daily. Patient not taking: Reported on 08/10/2015 05/16/15   [name], MD   BP 146/98 mmHg  Pulse 80  Temp(Src) 98.3 F (36.8 C)  Resp 17  SpO2 97% Physical Exam  Constitutional: She is oriented to person, place, and time. She appears well-developed and well-nourished. No distress.  HENT:  Head: Normocephalic and atraumatic.  Right Ear: External ear normal.  Left Ear: External ear normal.  Mouth/Throat: No oropharyngeal exudate.  Eyes: Conjunctivae and EOM are normal. Pupils are equal, round, and reactive to light. No scleral icterus.  Neck: Normal range of motion. Neck supple. No JVD present. No tracheal deviation present. No thyromegaly present.  Cardiovascular: Normal rate, regular rhythm and intact distal pulses.   Pulmonary/Chest: Effort normal and breath sounds normal. No respiratory distress. She has no wheezes. She has no rales.  Abdominal: Soft. Bowel sounds are normal. She exhibits no distension. There is no tenderness.  Musculoskeletal: Normal range of motion. She exhibits no edema or tenderness.  Neurological: She is alert and oriented to person, place, and time. No  cranial nerve deficit. She exhibits normal muscle tone. Coordination normal.  5/5 strength in all 4 extremities. Sensation intact and normal in all 4 extremities. Normal gait. Normal finger to nose and heel to shin. Negative romberg.   Patient reports equal sensation to bilateral CN V distributions during my exam  Skin: Skin is warm and dry. She is not diaphoretic. No pallor.  Psychiatric: She has a normal mood and affect. She expresses no homicidal and no  suicidal ideation. She expresses no suicidal plans and no homicidal plans.  Nursing note and vitals reviewed.   ED Course  Procedures (including critical care time) Labs Review Labs Reviewed  COMPREHENSIVE METABOLIC PANEL - Abnormal; Notable for the following:    Glucose, Bld 185 (*)    Total Protein 6.2 (*)    All other components within normal limits  I-STAT CHEM 8, ED - Abnormal; Notable for the following:    Chloride 100 (*)    Glucose, Bld 173 (*)    All other components within normal limits  ETHANOL  PROTIME-INR  APTT  CBC  DIFFERENTIAL  URINE RAPID DRUG SCREEN, HOSP PERFORMED  URINALYSIS, ROUTINE W REFLEX MICROSCOPIC (NOT AT [facility])  Randolm Idol, ED    Imaging Review Ct Head Wo Contrast  10/23/2015  CLINICAL DATA:  65 year old female with facial numbness. History of chronic migraine. EXAM: CT HEAD WITHOUT CONTRAST TECHNIQUE: Contiguous axial images were obtained from the base of the skull through the vertex without intravenous contrast. COMPARISON:  Brain MRI dated 06/13/2011 FINDINGS: The ventricles and the sulci are appropriate in size for the patient's age. There is no intracranial hemorrhage. No midline shift or mass effect identified. The gray-white matter differentiation is preserved. The visualized paranasal sinuses and mastoid air cells are well aerated. The calvarium is intact. IMPRESSION: No acute intracranial pathology. Electronically Signed   By: Anner Crete M.D.   On: 10/23/2015 21:38   I have  personally reviewed and evaluated these images and lab results as part of my medical decision-making.   EKG Interpretation   Date/Time:  Tuesday October 23 2015 21:43:06 EDT Ventricular Rate:  89 PR Interval:  146 QRS Duration: 111 QT Interval:  370 QTC Calculation: 450 R Axis:   -33 Text Interpretation:  Sinus rhythm Probable left atrial enlargement  Abnormal R-wave progression, late transition Left ventricular hypertrophy  No significant change since last tracing Confirmed by FLOYD MD, Quillian Quince  ([id number]) on 10/23/2015 9:56:49 PM      MDM   Final diagnoses:  Stroke (cerebrum) ([place])  Stroke (cerebrum) ([place])  Stroke (cerebrum) [facility])    The patient is a 65 year old female with a history of migraines and previous episode of left face numbness who presents for reoccurrence of her left face numbness starting at 6:50pm tonight. On arrival patient is afebrile and hemodynamically stable. Physical exam benign as above. Patient arrives as a code stroke neurology evaluates the patient on arrival. Please see their note for more detail. Head CT is unremarkable. Per neurology recommendations MRI brain will be performed. Patient can be discharged home if this is negative for acute pathology. Patient will be admitted if there are acute findings.  Patient seen with attending, Dr. Tyrone Nine, who oversaw clinical decision making.     Margaretann Loveless, MD 10/24/15 [place], DO 10/25/15 0020

## 2015-10-23 NOTE — Progress Notes (Signed)
Code Stroke called on 65 y.o female, LSN 1850. Patient at home this evening with sudden onset left sided facial numbness and burning sensation. Pt reports having a similar experience 4 years ago. Pertinent history includes hyperlipidemia, migraines, DM, and HTN.  Glucose 173.  CT negative for acute abnormalities per Neurologist Dr. Leonel Ramsay. NIHSS 0. Pt not a TPA candidate at this time as her symptoms are mild and improving. For MRI tonight, admit and workup pending results.

## 2015-10-23 NOTE — ED Notes (Signed)
Pt states that around 1850 started having some L sided tingling around her mouth and into her tongue, pt has hx of migraines and photophobia, no slurred speech or other neuro deficits noted.

## 2015-10-24 DIAGNOSIS — R200 Anesthesia of skin: Secondary | ICD-10-CM | POA: Diagnosis not present

## 2015-10-24 DIAGNOSIS — Z79899 Other long term (current) drug therapy: Secondary | ICD-10-CM | POA: Diagnosis not present

## 2015-10-24 DIAGNOSIS — I639 Cerebral infarction, unspecified: Secondary | ICD-10-CM | POA: Diagnosis not present

## 2015-10-24 DIAGNOSIS — Z7982 Long term (current) use of aspirin: Secondary | ICD-10-CM | POA: Diagnosis not present

## 2015-10-24 DIAGNOSIS — G43709 Chronic migraine without aura, not intractable, without status migrainosus: Secondary | ICD-10-CM | POA: Diagnosis not present

## 2015-10-24 DIAGNOSIS — I10 Essential (primary) hypertension: Secondary | ICD-10-CM | POA: Diagnosis not present

## 2015-10-24 DIAGNOSIS — E785 Hyperlipidemia, unspecified: Secondary | ICD-10-CM | POA: Diagnosis not present

## 2015-10-24 DIAGNOSIS — K219 Gastro-esophageal reflux disease without esophagitis: Secondary | ICD-10-CM | POA: Diagnosis not present

## 2015-10-24 DIAGNOSIS — Z7984 Long term (current) use of oral hypoglycemic drugs: Secondary | ICD-10-CM | POA: Diagnosis not present

## 2015-10-24 DIAGNOSIS — G8929 Other chronic pain: Secondary | ICD-10-CM | POA: Diagnosis not present

## 2015-10-24 MED ORDER — GADOBENATE DIMEGLUMINE 529 MG/ML IV SOLN
15.0000 mL | Freq: Once | INTRAVENOUS | Status: AC | PRN
  Administered 2015-10-24: 15 mL via INTRAVENOUS

## 2015-10-24 NOTE — Discharge Instructions (Signed)
Paresthesia Ms. Okabe, Your MRI does not show a stroke.  See your primary care doctor within 3 days for close follow up.  Come back to the ED immediately for any worsening. Thank you. Paresthesia is a burning or prickling feeling. This feeling can happen in any part of the body. It often happens in the hands, arms, legs, or feet. Usually, it is not painful. In most cases, the feeling goes away in a short time and is not a sign of a serious problem. HOME CARE  Avoid drinking alcohol.  Try massage or needle therapy (acupuncture) to help with your problems.  Keep all follow-up visits as told by your doctor. This is important. GET HELP IF:  You keep on having episodes of paresthesia.  Your burning or prickling feeling gets worse when you walk.  You have pain or cramps.  You feel dizzy.  You have a rash. GET HELP RIGHT AWAY IF:  You feel weak.  You have trouble walking or moving.  You have problems speaking, understanding, or seeing.  You feel confused.  You cannot control when you pee (urinate) or poop (bowel movement).  You lose feeling (numbness) after an injury.  You pass out (faint).   This information is not intended to replace advice given to you by your health care provider. Make sure you discuss any questions you have with your health care provider.   Document Released: 06/26/2008 Document Revised: 11/28/2014 Document Reviewed: 07/10/2014 Elsevier Interactive Patient Education [employer].

## 2015-10-24 NOTE — ED Provider Notes (Signed)
Patient signed out as pending MRI for evaluation of stroke.  This has returned negative.  Patient will be DC with PCP fu within 3 days.  Everlene Balls, MD 10/24/15 [phone]

## 2015-10-24 NOTE — ED Notes (Signed)
Patient transported to MRI 

## 2015-10-26 ENCOUNTER — Other Ambulatory Visit: Payer: Self-pay | Admitting: Emergency Medicine

## 2015-10-26 DIAGNOSIS — R200 Anesthesia of skin: Secondary | ICD-10-CM

## 2015-11-12 ENCOUNTER — Telehealth: Payer: Self-pay | Admitting: Neurology

## 2015-11-12 NOTE — Telephone Encounter (Signed)
I spoke to pt and advised her that Dr. Brett Fairy said it was ok for pt to see her as a new patient since we have never seen her in this office. 4/24 appt with Dr. Krista Blue was cancelled and a new patient appt was scheduled for 5/1 at 2:00 with Dr. Brett Fairy. Pt verbalized understanding.

## 2015-11-12 NOTE — Telephone Encounter (Signed)
This pt is requesting to see Dr. Brett Fairy instead of Dr. Krista Blue as a new patient on 11/19/15. Dr. Brett Fairy currently sees her husband.  Dr. Everlene Farrier referred the pt for L Facial numbness.  Dr. Brett Fairy, is it ok to change pt's appointment from Dr. Krista Blue to you?

## 2015-11-12 NOTE — Telephone Encounter (Signed)
Pt would like to transfer care to Dr. Brett Fairy because her husband see her as a pt. She is a new pt and has not been seen here. Appt with Dr. Krista Blue is 4/24. Will Dr. Brett Fairy want to see her as a pt? Please call pt to advise of decision. Thank you. If I need to call the pt to r/s just let me know.

## 2015-11-12 NOTE — Telephone Encounter (Signed)
That's fine with me. CD FYI to Dr. Everlene Farrier

## 2015-11-14 ENCOUNTER — Other Ambulatory Visit: Payer: Self-pay | Admitting: Emergency Medicine

## 2015-11-14 MED FILL — MONTELUKAST SOD 10 MG TAB: 10 | 60 days supply | Qty: 60 | Fill #2

## 2015-11-14 MED FILL — metFORMIN HCL 1000 MG TABS: 1000 | 90 days supply | Qty: 180 | Fill #2

## 2015-11-15 ENCOUNTER — Telehealth: Payer: Self-pay | Admitting: *Deleted

## 2015-11-15 MED FILL — ZOLPIDEM TARTRATE 5 MG TAB: 5 | 30 days supply | Qty: 30 | Fill #0

## 2015-11-15 NOTE — Telephone Encounter (Signed)
Called patient to find out the pharmacy the prescription for Ambien to be faxed, per patient fax to [facility]. It was faxed and confirmation page received at 9:48 am.

## 2015-11-19 ENCOUNTER — Ambulatory Visit: Payer: 59 | Admitting: Neurology

## 2015-11-26 ENCOUNTER — Encounter: Payer: Self-pay | Admitting: Neurology

## 2015-11-26 ENCOUNTER — Ambulatory Visit (INDEPENDENT_AMBULATORY_CARE_PROVIDER_SITE_OTHER): Payer: 59 | Admitting: Neurology

## 2015-11-26 VITALS — BP 138/82 | HR 76 | Resp 20 | Ht 68.0 in | Wt 188.0 lb

## 2015-11-26 DIAGNOSIS — G508 Other disorders of trigeminal nerve: Secondary | ICD-10-CM | POA: Diagnosis not present

## 2015-11-26 DIAGNOSIS — G509 Disorder of trigeminal nerve, unspecified: Secondary | ICD-10-CM | POA: Insufficient documentation

## 2015-11-26 NOTE — Progress Notes (Signed)
Provider:  Larey Seat, M D  Referring Provider: [name], MD Primary Care Physician:  Jenny Reichmann, MD  Chief Complaint  Patient presents with  . New Patient (Initial Visit)    L facial numbness, went to ER, rm 10, alone    HPI:  Debbie Hayes is a 65 y.o. female , seen here as a referral from Dr. Everlene Farrier for Hospital follow-up after a recent spell of left facial numbness and dysesthesia.  Chief complaint according to patient :   The patient has experienced for the second time in 4 years left facial dysesthesias this time on March 27, a feeling as if a liquid was dropping down the face from the left temple. This dysesthesia lasted for about 12 hours and returned every other day. She did not have motor weakness with it the patient has a history of steroid-induced diabetes, history of thyroid cancer, surgical hypothyroidism, gastroesophageal reflux disease, chronic migraine, fibromyalgia hyperlipidemia and TMJ syndrome. To treat TMJ she wears a mouthguard at night she also has a history of bruxism which is prevented by the dental device.  The emergency room medical records did indicate that the patient underwent an MRI of the brain which was entirely normal, that she had an EKG which was normal, and multiple laboratory tests.   Medical history : 4 years ago the patient had dysesthesias at the left upper lip, returning for several months. About 19 years ago she had left-sided facial zoster.   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 She has never used any kind of tobacco product, she rarely drinks wine, perhaps 1 glass every other month. She gave up caffeine 4 years ago when her migraines became chronic. She received Botox injections for the treatment of migraine.  Review of Systems: Out of a complete 14 system review, the patient complains of only the following symptoms, and all other reviewed systems are  negative.recurrent dyseasthesias of the left face- only in the middle branch. Which was affected by zoster.   Social History   Social History  . Marital Status: Married    Spouse Name: N/A  . Number of Children: N/A  . Years of Education: N/A   Occupational History  . Not on file.   Social History Main Topics  . Smoking status: Never Smoker   . Smokeless tobacco: Never Used  . Alcohol Use: Yes     Comment: RARE  . Drug Use: No  . Sexual Activity: Not on file   Other Topics Concern  . Not on file   Social History Narrative    Family History  Problem Relation Age of Onset  . Colon cancer Neg Hx   . Colon polyps Sister 72  . Alzheimer's disease Mother   . Heart disease Father   . Cirrhosis Father   . Heart attack Father   . Macular degeneration Father     Past Medical History  Diagnosis Date  . Rotator cuff disorder LEFT SHOULDER RTC IMPINGMENT  . Hyperlipidemia   . Normal nuclear stress test 01-25-2009  . Disorder of inner ear CAUSES VERTIGO OCCASIONALLY  . Chronic migraine BOTOX INJECTION EVERY 3 MONTHS  . Fibromyalgia   . Hemorrhoid   . Constipation   . Spondylolisthesis of lumbar region   . GERD (gastroesophageal reflux disease)   . Hypothyroidism   . History of thyroid cancer 2009  S/P TOTAL THYROIDECTOMY AND RADIATION  . Diabetes mellitus   .  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 Insomnia   . PONV (postoperative nausea and vomiting) SEVERE  . Chronic pain in right shoulder   . Left shoulder pain   . OA (osteoarthritis) JOINTS  . Hypertension CARDIOLOGIST- DR Wynonia Lawman- WILL REQUEST LATE NOTE    DENIES S & S  . Allergy   . Cataract   . History of bladder infections   . Wears glasses   . Macular degeneration     Past Surgical History  Procedure Laterality Date  . Right knee closed manipulation  09-11-2010  . Total knee arthroplasty  07-16-2010    RIGHT  . Right knee arthroscopy/ partial lateral menisectomy/ tricompartment chondroplasty/  decompression cyst  10-26-2009  . Right knee arthroscopy  X2  BEFORE 2011  . Total thyroidectomy  2009    CANCER  (POST-OP BLEED)  AND RADIATION TX  . Left thumb joint replacement  2005    RIGHT DONE IN 2004  . Right shoulder arthroscopy  2010  &  2004  . Bilateral elbow surg.  1999  . Bilateral carpal tunnel release  1994  . Vaginal hysterectomy  1990  . Bilateral salpingoophectomy  1993    POST-OP URETER REPAIR 12 DAYS AFTER   . Tonsillectomy  1968  . Tibia cyst removed and orif leg fx  1958  . Trigger fingers Right 2013    3rd and 4th fingers    Current Outpatient Prescriptions  Medication Sig Dispense Refill  . aspirin 81 MG tablet Take 81 mg by mouth daily.    Marland Kitchen azelastine (ASTELIN) 137 MCG/SPRAY nasal spray Place 2 sprays into both nostrils 2 (two) times daily. Use in each nostril as directed 90 mL 3  . blood glucose meter kit and supplies KIT Test blood sugar once daily. Dx code: E11.9 1 each 0  . cetirizine (ZYRTEC) 10 MG tablet Take 10 mg by mouth daily as needed for allergies.     . famotidine (PEPCID) 40 MG tablet Take 1 tablet (40 mg total) by mouth daily. 90 tablet 3  . glucose blood test strip Test blood sugar once daily. Dx code: E11.9 100 each 3  . Lancets MISC Test blood sugar once daily. Dx code: E11.9 100 each 3  . levothyroxine (SYNTHROID, LEVOTHROID) 137 MCG tablet Take 137 mcg by mouth daily before breakfast.    . lidocaine (LIDODERM) 5 % APPLY 1 PATCH FOR 12 HOURS OUT OF 24 HOURS 90 patch 0  . lidocaine (LIDODERM) 5 % Place 1 patch onto the skin as needed (for pain). Remove & Discard patch within 12 hours or as directed by MD    . losartan (COZAAR) 50 MG tablet Take 25 mg by mouth daily.    . metaxalone (SKELAXIN) 800 MG tablet Take 1 tablet (800 mg total) by mouth 3 (three) times daily. (Patient taking differently: Take 800 mg by mouth 3 (three) times daily as needed for muscle spasms. ) 90 tablet 2  . metFORMIN (GLUCOPHAGE) 1000 MG tablet TAKE 1 TABLET BY  MOUTH 2 TIMES DAILY WITH A MEAL. 180 tablet 3  . Methylcellulose, Laxative, (CITRUCEL PO) Take 6 tablets by mouth 2 (two) times daily.    . metoprolol succinate (TOPROL-XL) 25 MG 24 hr tablet Take 12.5 mg by mouth every morning.    . mometasone (ASMANEX) 220 MCG/INH inhaler Inhale 2 puffs into the lungs daily. 3 Inhaler 3  . montelukast (SINGULAIR) 10 MG tablet Take 1 tablet (10 mg total) by mouth at bedtime. [place]  tablet 3  . Multiple Vitamins-Minerals (PRESERVISION AREDS 2 PO) Take 1 tablet by mouth 2 (two) times daily. Reported on 10/22/2015    . NASONEX 50 MCG/ACT nasal spray PLACE 2 SPRAYS INTO THE NOSE DAILY. 17 g 9  . nitrofurantoin (MACRODANTIN) 50 MG capsule Take 50 mg by mouth at bedtime.     . ondansetron (ZOFRAN) 4 MG tablet Take 1 tablet (4 mg total) by mouth every 8 (eight) hours as needed for nausea or vomiting. 30 tablet 1  . ondansetron (ZOFRAN-ODT) 4 MG disintegrating tablet DISSOLVE 1 TABLET BY MOUTH EVERY 8 HOURS AS NEEDED FOR NAUSEA. 30 tablet 1  . phenazopyridine (PYRIDIUM) 200 MG tablet Take 1 tablet (200 mg total) by mouth 3 (three) times daily as needed for pain. 40 tablet 1  . Probiotic Product (PRO-BIOTIC BLEND) CAPS Take 1 capsule by mouth daily. Reported on 08/10/2015    . traZODone (DESYREL) 100 MG tablet TAKE 1-1/2-2 TABLETS BY MOUTH AT BEDTIME 180 tablet 3  . trimethoprim (TRIMPEX) 100 MG tablet Take 1 tablet (100 mg total) by mouth daily. 90 tablet 1  . TURMERIC PO Take 1 tablet by mouth 2 (two) times daily.    Marland Kitchen zolpidem (AMBIEN) 5 MG tablet TAKE 1 TABLET BY MOUTH AT BEDTIME AS NEEDED FOR SLEEP 30 tablet 5   No current facility-administered medications for this visit.    Allergies as of 11/26/2015 - Review Complete 11/26/2015  Allergen Reaction Noted  . Other Other (See Comments) 08/01/2014  . Sulfa antibiotics Hives 09/05/2011    Vitals: BP 138/82 mmHg  Pulse 76  Resp 20  Ht '5\' 8"'$  (1.727 m)  Wt 188 lb (85.276 kg)  BMI 28.59 kg/m2 Last Weight:  Wt  Readings from Last 1 Encounters:  11/26/15 188 lb (85.276 kg)   FBP:ZWCH mass index is 28.59 kg/(m^2).     Last Height:   Ht Readings from Last 1 Encounters:  11/26/15 '5\' 8"'$  (1.727 m)    Physical exam:  General: The patient is awake, alert and appears not in acute distress. The patient is well groomed. Head: Normocephalic, atraumatic. Neck is supple.  Cardiovascular:  Regular rate and rhythm, without  murmurs or carotid bruit, and without distended neck veins. Respiratory: Lungs are clear to auscultation. Skin:  Without evidence of edema, or rash Trunk: BMI is elevated. The patient's posture is erect.  Neurologic exam : The patient is awake and alert, oriented to place and time.   Memory subjective described as intact.  Attention span & concentration ability appears normal.  Speech is fluent,  without dysarthria, dysphonia or aphasia.  Mood and affect are appropriate.  Cranial nerves: Pupils are equal and briskly reactive to light. Funduscopic exam without  evidence of pallor or edema. Extraocular movements  in vertical and horizontal planes intact and without nystagmus. Visual fields by finger perimetry are intact.Hearing to finger rub intact.  Facial sensation the patient describes a numb spot lateral to the left outer angle of the eye and radiating over the zygomatic arch. Never cross the midline. 4 years ago she had numbness on the left upper lip. motor strength is symmetric and tongue and uvula move midline. Shoulder shrug was symmetrical.   Motor exam:  Normal tone, muscle bulk and symmetric strength in all extremities. Sensory:  Fine touch, pinprick and vibration were tested in all extremities. Proprioception tested in the upper extremities was normal. Coordination: Rapid alternating movements in the fingers/hands was normal. Finger-to-nose maneuver  normal without evidence of ataxia, dysmetria  or tremor. Gait and station: Patient walks without assistive device and is able  unassisted to climb up to the exam table. Strength within normal limits. Stance is stable and normal.   Deep tendon reflexes: in the  upper and lower extremities are symmetric and intact. Babinski maneuver response is downgoing.   Debbie Hayes, a pediatric nurse, was able to give me a very detailed description of the sensory abnormalities that led to her last emergency room visit. 4 years ago she had dysesthesias of the left face centered at the left upper lip now at a Zygomatic arch. She also took a couple of pictures showing that her left upper lip seemed to be slimmer than her right but there is no obliteration of the nasolabial fold. Based on this I do not think that this is a facial nerve involvement but the middle branch of the trigeminal nerve. 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 I like for her to continue her baby aspirin regimen, as she is diabetic I would not like to treat her with steroids, there is no report of taking acyclovir years after visible skin lesions have disappeared. I think it is likely that she will have intermittently every couple of years or so one of the spells. I would like for her to go to the emergency room only if she has a focal neurologic weakness change in her speech, swallowing or taste. The patient was advised of the nature of the diagnosed disorder , the treatment options and risks for general a health and wellness arising from not treating the condition.  I spent more than 45 minutes of face to face time with the patient. Greater than 50% of time was spent in counseling and coordination of care. We have discussed the diagnosis and differential and I answered the patient's questions.      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 History of left facial zoster.  Not a TIA/ not CVA.   Plan:  Treatment plan and additional workup : None    Asencion Partridge Priscila Bean MD  11/26/2015   CC: [name], [address] [place], [place] [address]

## 2015-12-13 MED FILL — ONDANSETRON HCL 4 MG TABLET: 4 | 10 days supply | Qty: 30 | Fill #1

## 2015-12-17 ENCOUNTER — Other Ambulatory Visit: Payer: Self-pay | Admitting: Emergency Medicine

## 2015-12-17 MED FILL — traZODone HCL 100 MG TABS: 100 | 90 days supply | Qty: 180 | Fill #1

## 2015-12-17 MED FILL — ZOLPIDEM TARTRATE 5 MG TAB: 5 | 30 days supply | Qty: 30 | Fill #1

## 2015-12-28 MED FILL — METOPROLOL SUCC ER 25 MG TA: 25 | 90 days supply | Qty: 45 | Fill #3

## 2016-01-07 DIAGNOSIS — H04123 Dry eye syndrome of bilateral lacrimal glands: Secondary | ICD-10-CM | POA: Diagnosis not present

## 2016-01-07 DIAGNOSIS — H16223 Keratoconjunctivitis sicca, not specified as Sjogren's, bilateral: Secondary | ICD-10-CM | POA: Diagnosis not present

## 2016-01-07 DIAGNOSIS — Z7984 Long term (current) use of oral hypoglycemic drugs: Secondary | ICD-10-CM | POA: Diagnosis not present

## 2016-01-07 DIAGNOSIS — H5203 Hypermetropia, bilateral: Secondary | ICD-10-CM | POA: Diagnosis not present

## 2016-01-07 DIAGNOSIS — H524 Presbyopia: Secondary | ICD-10-CM | POA: Diagnosis not present

## 2016-01-07 DIAGNOSIS — E119 Type 2 diabetes mellitus without complications: Secondary | ICD-10-CM | POA: Diagnosis not present

## 2016-01-07 DIAGNOSIS — E11319 Type 2 diabetes mellitus with unspecified diabetic retinopathy without macular edema: Secondary | ICD-10-CM | POA: Diagnosis not present

## 2016-01-07 DIAGNOSIS — H52222 Regular astigmatism, left eye: Secondary | ICD-10-CM | POA: Diagnosis not present

## 2016-01-07 DIAGNOSIS — H43813 Vitreous degeneration, bilateral: Secondary | ICD-10-CM | POA: Diagnosis not present

## 2016-01-07 MED FILL — XIIDRA 5% EYE DROPS: 5 | 30 days supply | Qty: 60 | Fill #0

## 2016-01-11 ENCOUNTER — Other Ambulatory Visit: Payer: Self-pay | Admitting: Physician Assistant

## 2016-01-11 MED FILL — NITROFURANTOIN MCR 50 MG CA: 50 | 60 days supply | Qty: 60 | Fill #2

## 2016-01-11 MED FILL — METAXALONE 800 MG TABLET: 800 | 30 days supply | Qty: 90 | Fill #2

## 2016-01-14 MED FILL — MONTELUKAST SOD 10 MG TAB: 10 | 30 days supply | Qty: 30 | Fill #0

## 2016-01-14 MED FILL — LEVOTHYROXINE 137 MCG TAB: 137 | 90 days supply | Qty: 90 | Fill #0

## 2016-01-15 MED FILL — ZOLPIDEM TARTRATE 5 MG TAB: 5 | 30 days supply | Qty: 30 | Fill #2

## 2016-01-16 ENCOUNTER — Ambulatory Visit (INDEPENDENT_AMBULATORY_CARE_PROVIDER_SITE_OTHER): Payer: 59 | Admitting: Emergency Medicine

## 2016-01-16 VITALS — BP 130/80 | HR 81 | Temp 97.8°F | Resp 16 | Ht 68.0 in | Wt 185.0 lb

## 2016-01-16 DIAGNOSIS — I10 Essential (primary) hypertension: Secondary | ICD-10-CM | POA: Diagnosis not present

## 2016-01-16 MED ORDER — METOPROLOL SUCCINATE ER 25 MG PO TB24: 25.0000 mg | ORAL_TABLET | Freq: Every morning | ORAL | Status: DC

## 2016-01-16 MED ORDER — LOSARTAN POTASSIUM 50 MG PO TABS: ORAL_TABLET | ORAL | Status: DC

## 2016-01-16 MED FILL — LOSARTAN POTASSIUM 50 MG TA: 50 | 90 days supply | Qty: 90 | Fill #0

## 2016-01-16 NOTE — Patient Instructions (Signed)
DASH Eating Plan  DASH stands for "Dietary Approaches to Stop Hypertension." The DASH eating plan is a healthy eating plan that has been shown to reduce high blood pressure (hypertension). Additional health benefits may include reducing the risk of type 2 diabetes mellitus, heart disease, and stroke. The DASH eating plan may also help with weight loss.  WHAT DO I NEED TO KNOW ABOUT THE DASH EATING PLAN?  For the DASH eating plan, you will follow these general guidelines:  · Choose foods with a percent daily value for sodium of less than 5% (as listed on the food label).  · Use salt-free seasonings or herbs instead of table salt or sea salt.  · Check with your health care provider or pharmacist before using salt substitutes.  · Eat lower-sodium products, often labeled as "lower sodium" or "no salt added."  · Eat fresh foods.  · Eat more vegetables, fruits, and low-fat dairy products.  · Choose whole grains. Look for the word "whole" as the first word in the ingredient list.  · Choose fish and skinless chicken or turkey more often than red meat. Limit fish, poultry, and meat to 6 oz (170 g) each day.  · Limit sweets, desserts, sugars, and sugary drinks.  · Choose heart-healthy fats.  · Limit cheese to 1 oz (28 g) per day.  · Eat more home-cooked food and less restaurant, buffet, and fast food.  · Limit fried foods.  · Cook foods using methods other than frying.  · Limit canned vegetables. If you do use them, rinse them well to decrease the sodium.  · When eating at a restaurant, ask that your food be prepared with less salt, or no salt if possible.  WHAT FOODS CAN I EAT?  Seek help from a dietitian for individual calorie needs.  Grains  Whole grain or whole wheat bread. Brown rice. Whole grain or whole wheat pasta. Quinoa, bulgur, and whole grain cereals. Low-sodium cereals. Corn or whole wheat flour tortillas. Whole grain cornbread. Whole grain crackers. Low-sodium crackers.  Vegetables  Fresh or frozen vegetables  (raw, steamed, roasted, or grilled). Low-sodium or reduced-sodium tomato and vegetable juices. Low-sodium or reduced-sodium tomato sauce and paste. Low-sodium or reduced-sodium canned vegetables.   Fruits  All fresh, canned (in natural juice), or frozen fruits.  Meat and Other Protein Products  Ground beef (85% or leaner), grass-fed beef, or beef trimmed of fat. Skinless chicken or turkey. Ground chicken or turkey. Pork trimmed of fat. All fish and seafood. Eggs. Dried beans, peas, or lentils. Unsalted nuts and seeds. Unsalted canned beans.  Dairy  Low-fat dairy products, such as skim or 1% milk, 2% or reduced-fat cheeses, low-fat ricotta or cottage cheese, or plain low-fat yogurt. Low-sodium or reduced-sodium cheeses.  Fats and Oils  Tub margarines without trans fats. Light or reduced-fat mayonnaise and salad dressings (reduced sodium). Avocado. Safflower, olive, or canola oils. Natural peanut or almond butter.  Other  Unsalted popcorn and pretzels.  The items listed above may not be a complete list of recommended foods or beverages. Contact your dietitian for more options.  WHAT FOODS ARE NOT RECOMMENDED?  Grains  White bread. White pasta. White rice. Refined cornbread. Bagels and croissants. Crackers that contain trans fat.  Vegetables  Creamed or fried vegetables. Vegetables in a cheese sauce. Regular canned vegetables. Regular canned tomato sauce and paste. Regular tomato and vegetable juices.  Fruits  Dried fruits. Canned fruit in light or heavy syrup. Fruit juice.  Meat and Other Protein   Products  Fatty cuts of meat. Ribs, chicken wings, bacon, sausage, bologna, salami, chitterlings, fatback, hot dogs, bratwurst, and packaged luncheon meats. Salted nuts and seeds. Canned beans with salt.  Dairy  Whole or 2% milk, cream, half-and-half, and cream cheese. Whole-fat or sweetened yogurt. Full-fat cheeses or blue cheese. Nondairy creamers and whipped toppings. Processed cheese, cheese spreads, or cheese  curds.  Condiments  Onion and garlic salt, seasoned salt, table salt, and sea salt. Canned and packaged gravies. Worcestershire sauce. Tartar sauce. Barbecue sauce. Teriyaki sauce. Soy sauce, including reduced sodium. Steak sauce. Fish sauce. Oyster sauce. Cocktail sauce. Horseradish. Ketchup and mustard. Meat flavorings and tenderizers. Bouillon cubes. Hot sauce. Tabasco sauce. Marinades. Taco seasonings. Relishes.  Fats and Oils  Butter, stick margarine, lard, shortening, ghee, and bacon fat. Coconut, palm kernel, or palm oils. Regular salad dressings.  Other  Pickles and olives. Salted popcorn and pretzels.  The items listed above may not be a complete list of foods and beverages to avoid. Contact your dietitian for more information.  WHERE CAN I FIND MORE INFORMATION?  National Heart, [employer], [employer] Institute: www.nhlbi.nih.gov/health/health-topics/topics/dash/     This information is not intended to replace advice given to you by your health care provider. Make sure you discuss any questions you have with your health care provider.     Document Released: 07/03/2011 Document Revised: 08/04/2014 Document Reviewed: 05/18/2013  [employer]vier Interactive Patient Education ©2016 [employer]

## 2016-01-16 NOTE — Progress Notes (Addendum)
Patient ID: Debbie Hayes, female   DOB: 03-Aug-1950, 65 y.o.   MRN: [id number]    By signing my name below, I, Debbie Hayes, attest that this documentation has been prepared under the direction and in the presence of [name], MD Electronically Signed: Ladene Artist, ED Scribe 01/16/2016 at 11:30 AM.  Chief Complaint:  Chief Complaint  Patient presents with  . ELEVATED BLOOD PRESSURE    WOULD LIKE TO DISCUSS INCREASING BP MEDICATION    HPI: Debbie Hayes is a 65 y.o. female, with a h/o HTN and DM, who reports to [facility] today complaining of elevated BP for the past few weeks. Pt states that she took the liberty of doubling her Cozaar from 25 to 50 2 weeks ago due to a small retinal bleed and feeling her BP while lying down. Pt reports a "pulsating" sensation with lying that has resolved since she doubled her medications. She also expresses her desire to increase metoprolol 12.5 mg. Pt states that her BP typically runs 120/80 when she intermittently checks her BP at home. She is unsure of her average pulse rate. Triage BP: 130/80; Repeat BP on the L; 122/78 and 122/72 on the R. Pt denies weight gain in the past 8 years but states that she has gotten more of a belly. She also states that her blood sugar has increased to 140-150s.  Past Medical History  Diagnosis Date  . Rotator cuff disorder LEFT SHOULDER RTC IMPINGMENT  . Hyperlipidemia   . Normal nuclear stress test 01-25-2009  . Disorder of inner ear CAUSES VERTIGO OCCASIONALLY  . Chronic migraine BOTOX INJECTION EVERY 3 MONTHS  . Fibromyalgia   . Hemorrhoid   . Constipation   . Spondylolisthesis of lumbar region   . GERD (gastroesophageal reflux disease)   . Hypothyroidism   . History of thyroid cancer 2009  S/P TOTAL THYROIDECTOMY AND RADIATION  . Diabetes mellitus   . 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 Insomnia   . PONV (postoperative nausea and vomiting) SEVERE  . Chronic pain in right shoulder   . Left  shoulder pain   . OA (osteoarthritis) JOINTS  . Hypertension CARDIOLOGIST- DR Wynonia Lawman- WILL REQUEST LATE NOTE    DENIES S & S  . Allergy   . Cataract   . History of bladder infections   . Wears glasses   . Macular degeneration    Past Surgical History  Procedure Laterality Date  . Right knee closed manipulation  09-11-2010  . Total knee arthroplasty  07-16-2010    RIGHT  . Right knee arthroscopy/ partial lateral menisectomy/ tricompartment chondroplasty/ decompression cyst  10-26-2009  . Right knee arthroscopy  X2  BEFORE 2011  . Total thyroidectomy  2009    CANCER  (POST-OP BLEED)  AND RADIATION [place]  . Left thumb joint replacement  2005    RIGHT DONE IN 2004  . Right shoulder arthroscopy  2010  &  2004  . Bilateral elbow surg.  1999  . Bilateral carpal tunnel release  1994  . Vaginal hysterectomy  1990  . Bilateral salpingoophectomy  1993    POST-OP URETER REPAIR 12 DAYS AFTER   . Tonsillectomy  1968  . Tibia cyst removed and orif leg fx  1958  . Trigger fingers Right 2013    3rd and 4th fingers   Social History   Social History  . Marital Status: Married    Spouse Name: N/A  . Number of Children: N/A  .  Years of Education: N/A   Social History Main Topics  . Smoking status: Never Smoker   . Smokeless tobacco: Never Used  . Alcohol Use: Yes     Comment: RARE  . Drug Use: No  . Sexual Activity: Not Asked   Other Topics Concern  . None   Social History Narrative   Family History  Problem Relation Age of Onset  . Colon cancer Neg Hx   . Colon polyps Sister 59  . Alzheimer's disease Mother   . Heart disease Father   . Cirrhosis Father   . Heart attack Father   . Macular degeneration Father    Allergies  Allergen Reactions  . Other Other (See Comments)    Artificial sweetners  . Sulfa Antibiotics Hives   Prior to Admission medications   Medication Sig Start Date End Date Taking? Authorizing Provider  aspirin 81 MG tablet Take 81 mg by mouth daily.    Yes Historical Provider, MD  azelastine (ASTELIN) 137 MCG/SPRAY nasal spray Place 2 sprays into both nostrils 2 (two) times daily. Use in each nostril as directed 10/14/13  Yes [name], MD  blood glucose meter kit and supplies KIT Test blood sugar once daily. Dx code: E11.9 03/08/15  Yes [name], MD  cetirizine (ZYRTEC) 10 MG tablet Take 10 mg by mouth daily as needed for allergies.    Yes Historical Provider, MD  famotidine (PEPCID) 40 MG tablet Take 1 tablet (40 mg total) by mouth daily. 08/10/15  Yes [name], MD  glucose blood test strip Test blood sugar once daily. Dx code: E11.9 03/08/15  Yes [name], MD  Lancets MISC Test blood sugar once daily. Dx code: E11.9 03/08/15  Yes [name], MD  levothyroxine (SYNTHROID, LEVOTHROID) 137 MCG tablet Take 137 mcg by mouth daily before breakfast.   Yes Historical Provider, MD  lidocaine (LIDODERM) 5 % Place 1 patch onto the skin as needed (for pain). Remove & Discard patch within 12 hours or as directed by MD   Yes Historical Provider, MD  losartan (COZAAR) 50 MG tablet Take 25 mg by mouth daily.   Yes Historical Provider, MD  metaxalone (SKELAXIN) 800 MG tablet Take 1 tablet (800 mg total) by mouth 3 (three) times daily. Patient taking differently: Take 800 mg by mouth 3 (three) times daily as needed for muscle spasms.  04/25/15  Yes [name], MD  metFORMIN (GLUCOPHAGE) 1000 MG tablet TAKE 1 TABLET BY MOUTH 2 TIMES DAILY WITH A MEAL. 05/01/15  Yes [name], MD  Methylcellulose, Laxative, (CITRUCEL PO) Take 6 tablets by mouth 2 (two) times daily.   Yes Historical Provider, MD  metoprolol succinate (TOPROL-XL) 25 MG 24 hr tablet Take 12.5 mg by mouth every morning.   Yes Historical Provider, MD  montelukast (SINGULAIR) 10 MG tablet TAKE 1 TABLET BY MOUTH AT BEDTIME 01/12/16  Yes Nicole V Bush, PA-C  NASONEX 50 MCG/ACT nasal spray PLACE 2 SPRAYS INTO THE NOSE DAILY. 09/20/14  Yes [name], MD  nitrofurantoin (MACRODANTIN)  50 MG capsule Take 50 mg by mouth at bedtime.    Yes Historical Provider, MD  ondansetron (ZOFRAN) 4 MG tablet Take 1 tablet (4 mg total) by mouth every 8 (eight) hours as needed for nausea or vomiting. 08/10/15  Yes [name], MD  phenazopyridine (PYRIDIUM) 200 MG tablet Take 1 tablet (200 mg total) by mouth 3 (three) times daily as needed for pain. 06/28/14  Yes Chelle Jeffery, PA-C  Probiotic Product (PRO-BIOTIC BLEND) CAPS Take 1 capsule by mouth daily. Reported on 08/10/2015   Yes Historical Provider, MD  traZODone (DESYREL) 100 MG tablet TAKE 1-1/2-2 TABLETS BY MOUTH AT BEDTIME 02/06/15  Yes [name], MD  TURMERIC PO Take 1 tablet by mouth 2 (two) times daily.   Yes Historical Provider, MD  zolpidem (AMBIEN) 5 MG tablet TAKE 1 TABLET BY MOUTH AT BEDTIME AS NEEDED FOR SLEEP 11/15/15  Yes [name], MD  mometasone [facility]) 220 MCG/INH inhaler Inhale 2 puffs into the lungs daily. Patient not taking: Reported on 01/16/2016 10/14/13   [name], MD   ROS: The patient denies fevers, chills, night sweats, unintentional weight loss, chest pain, palpitations, wheezing, dyspnea on exertion, nausea, vomiting, abdominal pain, dysuria, hematuria, melena, numbness, weakness, or tingling.   All other systems have been reviewed and were otherwise negative with the exception of those mentioned in the HPI and as above.    PHYSICAL EXAM: Filed Vitals:   01/16/16 1107  BP: 130/80  Pulse: 81  Temp: 97.8 F (36.6 C)  Resp: 16   Body mass index is 28.14 kg/(m^2).  General: Alert, no acute distress HEENT:  Normocephalic, oropharynx patent. Bruising on the inside portion on the nose medial to the L eye.  Eye: [facility], [facility].  Cardiovascular: Regular rate and rhythm, no rubs murmurs or gallops. No Carotid bruits, radial pulse intact. No pedal edema.  Respiratory: Clear to auscultation bilaterally. No wheezes, rales, or rhonchi. No cyanosis, no use of accessory musculature Abdominal: No  organomegaly, abdomen is soft and non-tender, positive bowel sounds. No masses. Musculoskeletal: Gait intact. No edema, tenderness Skin: No rashes. Neurologic: Facial musculature symmetric. Psychiatric: Patient acts appropriately throughout our interaction. Lymphatic: No cervical or submandibular lymphadenopathy  LABS:  EKG/XRAY:   Primary read interpreted by Dr. Everlene Farrier at [facility].  ASSESSMENT/PLAN: Recent blood pressures have been slightly elevated. She is currently on losartan 50 mg a day and Toprol XL 12.5 QD. We'll increase her to the Toprol-XL 25 mg daily. She will monitor her blood pressure and pulse at home. She was also given dash-diet information. I personally performed the services described in this documentation, which was scribed in my presence. The recorded information has been reviewed and is accurate.   Gross sideeffects, risk and benefits, and alternatives of medications d/w patient. Patient is aware that all medications have potential sideeffects and we are unable to predict every sideeffect or drug-drug interaction that may occur.  [name] MD 01/16/2016 11:12 AM

## 2016-01-18 MED FILL — RELPAX 20 MG TABLET: 20 | 30 days supply | Qty: 9 | Fill #0

## 2016-01-18 MED FILL — METOPROLOL SUCC ER 25 MG TA: 25 | 90 days supply | Qty: 90 | Fill #0

## 2016-01-22 ENCOUNTER — Other Ambulatory Visit: Payer: Self-pay | Admitting: Emergency Medicine

## 2016-01-22 DIAGNOSIS — I10 Essential (primary) hypertension: Secondary | ICD-10-CM

## 2016-01-22 MED ORDER — LOSARTAN POTASSIUM 50 MG PO TABS: ORAL_TABLET | ORAL | Status: DC

## 2016-01-22 MED ORDER — METOPROLOL SUCCINATE ER 25 MG PO TB24: 25.0000 mg | ORAL_TABLET | Freq: Every morning | ORAL | Status: DC

## 2016-02-05 DIAGNOSIS — E890 Postprocedural hypothyroidism: Secondary | ICD-10-CM | POA: Diagnosis not present

## 2016-02-05 MED FILL — metFORMIN HCL 1000 MG TABS: 1000 | 90 days supply | Qty: 180 | Fill #3

## 2016-02-06 DIAGNOSIS — C73 Malignant neoplasm of thyroid gland: Secondary | ICD-10-CM | POA: Diagnosis not present

## 2016-02-06 DIAGNOSIS — E890 Postprocedural hypothyroidism: Secondary | ICD-10-CM | POA: Diagnosis not present

## 2016-02-06 MED FILL — MONTELUKAST SOD 10 MG TAB: 10 | 90 days supply | Qty: 90 | Fill #1

## 2016-02-27 ENCOUNTER — Other Ambulatory Visit: Payer: Self-pay | Admitting: Emergency Medicine

## 2016-02-27 ENCOUNTER — Encounter: Payer: Self-pay | Admitting: Family Medicine

## 2016-02-27 ENCOUNTER — Ambulatory Visit (INDEPENDENT_AMBULATORY_CARE_PROVIDER_SITE_OTHER): Payer: 59 | Admitting: Family Medicine

## 2016-02-27 VITALS — BP 122/84 | HR 75 | Temp 98.6°F | Resp 18 | Ht 68.0 in | Wt 188.0 lb

## 2016-02-27 DIAGNOSIS — R8290 Unspecified abnormal findings in urine: Secondary | ICD-10-CM

## 2016-02-27 DIAGNOSIS — G43001 Migraine without aura, not intractable, with status migrainosus: Secondary | ICD-10-CM

## 2016-02-27 DIAGNOSIS — N390 Urinary tract infection, site not specified: Secondary | ICD-10-CM | POA: Diagnosis not present

## 2016-02-27 LAB — POCT URINALYSIS DIP (MANUAL ENTRY)
BILIRUBIN UA: NEGATIVE
Bilirubin, UA: NEGATIVE
Glucose, UA: NEGATIVE
Leukocytes, UA: NEGATIVE
NITRITE UA: NEGATIVE
PH UA: 5.5
PROTEIN UA: NEGATIVE
RBC UA: NEGATIVE
SPEC GRAV UA: 1.02
UROBILINOGEN UA: 0.2

## 2016-02-27 LAB — POC MICROSCOPIC URINALYSIS (UMFC)

## 2016-02-27 NOTE — Patient Instructions (Addendum)
  Follow-up as needed.  Hydrate with plenty of fluids.  Nice meeting you !  Have a great day,  Carroll Sage. Kenton Kingfisher, MSN, FNP-C [facility] [place]   IF you received an x-ray today, you will receive an invoice from [facility]. Please contact [facility] at [phone] with questions or concerns regarding your invoice.   IF you received labwork today, you will receive an invoice from [employer]. Please contact [facility] at [phone] with questions or concerns regarding your invoice.   Our billing staff will not be able to assist you with questions regarding bills from these companies.  You will be contacted with the lab results as soon as they are available. The fastest way to get your results is to activate your My Chart account. Instructions are located on the last page of this paperwork. If you have not heard from [place] regarding the results in 2 weeks, please contact this office.

## 2016-02-27 NOTE — Progress Notes (Signed)
Patient ID: Debbie Hayes, female    DOB: Jan 06, 1951, 65 y.o.   MRN: [id number]  PCP: Jenny Reichmann, MD  Chief Complaint  Patient presents with  . Other    urine odor    Subjective:   HPI Presents for evaluation strong urine times 4 days. She has a history of chronic UTI and is managed on a chronic daily dose nitrofurantoin 50 mg daily which was prescribed by her urologist. She has been free of UTI since beginning this regimen. The patient's spouse commented that her urine had a very strong odor. She denies urine frequency, burning with urination, or flank pain. No fever or chills present.    Social History   Social History  . Marital status: Married    Spouse name: N/A  . Number of children: N/A  . Years of education: N/A   Occupational History  . Not on file.   Social History Main Topics  . Smoking status: Never Smoker  . Smokeless tobacco: Never Used  . Alcohol use Yes     Comment: RARE  . Drug use: No  . Sexual activity: Not on file   Other Topics Concern  . Not on file   Social History Narrative  . No narrative on file    . Family History  Problem Relation Age of Onset  . Colon polyps Sister 61  . Alzheimer's disease Mother   . Heart disease Father   . Cirrhosis Father   . Heart attack Father   . Macular degeneration Father   . Colon cancer Neg Hx    Review of Systems  Constitutional: Negative.   Respiratory: Negative.   Cardiovascular: Negative.   Genitourinary:       See HPI  Skin: Negative.        Patient Active Problem List   Diagnosis Date Noted  . Trigeminal anesthesia 11/26/2015  . Spondylolisthesis of lumbar region 08/09/2014  . Back pain 02/17/2014  . Macular degeneration 02/17/2014  . Other and unspecified hyperlipidemia 10/14/2013  . Onychomycosis 12/01/2012  . Diabetes mellitus ([place]) 04/12/2012  . Arthritis 04/12/2012  . Hypothyroid 04/12/2012     Prior to Admission medications   Medication Sig Start Date End  Date Taking? Authorizing Provider  aspirin 81 MG tablet Take 81 mg by mouth daily.   Yes Historical Provider, MD  azelastine (ASTELIN) 137 MCG/SPRAY nasal spray Place 2 sprays into both nostrils 2 (two) times daily. Use in each nostril as directed 10/14/13  Yes [name], MD  blood glucose meter kit and supplies KIT Test blood sugar once daily. Dx code: E11.9 03/08/15  Yes [name], MD  cetirizine (ZYRTEC) 10 MG tablet Take 10 mg by mouth daily as needed for allergies.    Yes Historical Provider, MD  famotidine (PEPCID) 40 MG tablet Take 1 tablet (40 mg total) by mouth daily. 08/10/15  Yes [name], MD  glucose blood test strip Test blood sugar once daily. Dx code: E11.9 03/08/15  Yes [name], MD  Lancets MISC Test blood sugar once daily. Dx code: E11.9 03/08/15  Yes [name], MD  levothyroxine (SYNTHROID, LEVOTHROID) 137 MCG tablet Take 137 mcg by mouth daily before breakfast.   Yes Historical Provider, MD  lidocaine (LIDODERM) 5 % Place 1 patch onto the skin as needed (for pain). Remove & Discard patch within 12 hours or as directed by MD   Yes Historical Provider, MD  losartan (COZAAR) 50 MG tablet One  a day 01/22/16  Yes [name], MD  metaxalone (SKELAXIN) 800 MG tablet Take 1 tablet (800 mg total) by mouth 3 (three) times daily. Patient taking differently: Take 800 mg by mouth 3 (three) times daily as needed for muscle spasms.  04/25/15  Yes [name], MD  metFORMIN (GLUCOPHAGE) 1000 MG tablet TAKE 1 TABLET BY MOUTH 2 TIMES DAILY WITH A MEAL. 05/01/15  Yes [name], MD  Methylcellulose, Laxative, (CITRUCEL PO) Take 6 tablets by mouth 2 (two) times daily.   Yes Historical Provider, MD  metoprolol succinate (TOPROL-XL) 25 MG 24 hr tablet Take 1 tablet (25 mg total) by mouth every morning. 01/22/16  Yes [name], MD  mometasone [facility]) 220 MCG/INH inhaler Inhale 2 puffs into the lungs daily. 10/14/13  Yes [name], MD  montelukast (SINGULAIR) 10 MG tablet  TAKE 1 TABLET BY MOUTH AT BEDTIME 01/12/16  Yes Nicole V Bush, PA-C  NASONEX 50 MCG/ACT nasal spray PLACE 2 SPRAYS INTO THE NOSE DAILY. 09/20/14  Yes [name], MD  nitrofurantoin (MACRODANTIN) 50 MG capsule Take 50 mg by mouth at bedtime.    Yes Historical Provider, MD  ondansetron (ZOFRAN) 4 MG tablet Take 1 tablet (4 mg total) by mouth every 8 (eight) hours as needed for nausea or vomiting. 08/10/15  Yes [name], MD  phenazopyridine (PYRIDIUM) 200 MG tablet Take 1 tablet (200 mg total) by mouth 3 (three) times daily as needed for pain. 06/28/14  Yes Chelle Jeffery, PA-C  Probiotic Product (PRO-BIOTIC BLEND) CAPS Take 1 capsule by mouth daily. Reported on 08/10/2015   Yes Historical Provider, MD  traZODone (DESYREL) 100 MG tablet TAKE 1-1/2-2 TABLETS BY MOUTH AT BEDTIME 02/06/15  Yes [name], MD  TURMERIC PO Take 1 tablet by mouth 2 (two) times daily.   Yes Historical Provider, MD  zolpidem (AMBIEN) 5 MG tablet TAKE 1 TABLET BY MOUTH AT BEDTIME AS NEEDED FOR SLEEP 11/15/15  Yes [name], MD     Allergies  Allergen Reactions  . Other Other (See Comments)    Artificial sweetners  . Sulfa Antibiotics Hives       Objective:  Physical Exam  Constitutional: She is oriented to person, place, and time. She appears well-developed and well-nourished.  HENT:  Head: Normocephalic.  Eyes: Conjunctivae and EOM are normal. Pupils are equal, round, and reactive to light.  Neck: Normal range of motion. Neck supple.  Cardiovascular: Normal rate, regular rhythm, normal heart sounds and intact distal pulses.   Pulmonary/Chest: Effort normal and breath sounds normal.  Abdominal: Soft. She exhibits no distension and no mass. There is no tenderness. There is no rebound and no guarding.  Genitourinary:  Genitourinary Comments: UA and dipstick-negative  Musculoskeletal: Normal range of motion.  Neurological: She is alert and oriented to person, place, and time.  Skin: Skin is warm and dry.    Psychiatric: She has a normal mood and affect. Her behavior is normal. Judgment and thought content normal.           Assessment & Plan:  1. Abnormal urine odor 2. Chronic UTI - POCT Microscopic Urinalysis ([facility])-Negative - POCT urinalysis dipstick-Negative - Urine culture-pending   Will send urine for culture. Increase fluids. Continue chronic UTI treatment with Nitrofurantoin. Follow-up as needed.  Carroll Sage. Kenton Kingfisher, MSN, FNP-C [facility]

## 2016-02-28 LAB — URINE CULTURE: ORGANISM ID, BACTERIA: NO GROWTH

## 2016-03-05 ENCOUNTER — Ambulatory Visit (INDEPENDENT_AMBULATORY_CARE_PROVIDER_SITE_OTHER): Payer: 59 | Admitting: Emergency Medicine

## 2016-03-05 VITALS — BP 124/78 | HR 78 | Temp 98.3°F | Resp 16 | Ht 68.0 in | Wt 186.0 lb

## 2016-03-05 DIAGNOSIS — Z7184 Encounter for health counseling related to travel: Secondary | ICD-10-CM

## 2016-03-05 DIAGNOSIS — E119 Type 2 diabetes mellitus without complications: Secondary | ICD-10-CM

## 2016-03-05 DIAGNOSIS — Z1159 Encounter for screening for other viral diseases: Secondary | ICD-10-CM | POA: Diagnosis not present

## 2016-03-05 DIAGNOSIS — I10 Essential (primary) hypertension: Secondary | ICD-10-CM

## 2016-03-05 DIAGNOSIS — Z7189 Other specified counseling: Secondary | ICD-10-CM

## 2016-03-05 LAB — GLUCOSE, POCT (MANUAL RESULT ENTRY): POC GLUCOSE: 97 mg/dL (ref 70–99)

## 2016-03-05 LAB — POCT CBC
GRANULOCYTE PERCENT: 56 % (ref 37–80)
HCT, POC: 39.8 % (ref 37.7–47.9)
Hemoglobin: 13.3 g/dL (ref 12.2–16.2)
Lymph, poc: 1.8 (ref 0.6–3.4)
MCH: 27.9 pg (ref 27–31.2)
MCHC: 33.5 g/dL (ref 31.8–35.4)
MCV: 83.2 fL (ref 80–97)
MID (CBC): 0.7 (ref 0–0.9)
MPV: 7.4 fL (ref 0–99.8)
PLATELET COUNT, POC: 195 10*3/uL (ref 142–424)
POC Granulocyte: 3.2 (ref 2–6.9)
POC LYMPH PERCENT: 31.8 %L (ref 10–50)
POC MID %: 12.2 % — AB (ref 0–12)
RBC: 4.78 M/uL (ref 4.04–5.48)
RDW, POC: 14.6 %
WBC: 5.7 10*3/uL (ref 4.6–10.2)

## 2016-03-05 LAB — POCT GLYCOSYLATED HEMOGLOBIN (HGB A1C): Hemoglobin A1C: 7

## 2016-03-05 MED ORDER — LOSARTAN POTASSIUM 50 MG PO TABS: ORAL_TABLET | ORAL | 3 refills | Status: DC

## 2016-03-05 MED ORDER — ATOVAQUONE-PROGUANIL HCL 250-100 MG PO TABS: ORAL_TABLET | ORAL | 0 refills | Status: DC

## 2016-03-05 MED FILL — LOSARTAN POTASSIUM 50 MG TA: 50 | 90 days supply | Qty: 135 | Fill #0

## 2016-03-05 MED FILL — ATOVAQUONE-PROGUANIL 250-10: 250-100 | 40 days supply | Qty: 40 | Fill #0

## 2016-03-05 NOTE — Patient Instructions (Signed)
     IF you received an x-ray today, you will receive an invoice from [facility]. Please contact [facility] at [phone] with questions or concerns regarding your invoice.   IF you received labwork today, you will receive an invoice from [facility]. Please contact [facility] at [phone] with questions or concerns regarding your invoice.   Our billing staff will not be able to assist you with questions regarding bills from these companies.  You will be contacted with the lab results as soon as they are available. The fastest way to get your results is to activate your My Chart account. Instructions are located on the last page of this paperwork. If you have not heard from us regarding the results in 2 weeks, please contact this office.      

## 2016-03-05 NOTE — Progress Notes (Signed)
Patient ID: Debbie Hayes, female   DOB: 22-Jun-1951, 65 y.o.   MRN: [id number]    By signing my name below I, Tereasa Coop, attest that this documentation has been prepared under the direction and in the presence of [name], MD. Electonically Signed. Tereasa Coop, Scribe 03/05/2016 at 12:47 PM  Chief Complaint:  Chief Complaint  Patient presents with  . Follow-up    diabetes recheck    HPI: Debbie Hayes is a 65 y.o. female who reports to [facility] today for follow up reevaluation of DM.   Pt states that she thinks she needs a sleep study done.   Pt's BP has been slightly elevated recently. Pt has increased her dose of cozaar and has been taking 69m cozaar instead of 542m   Pt states she is doing well. Pt reports that she has been taking hemp oil for pain with great relief. Pt also reports taking probiotics and tumeric pills.  [place] Pt also requesting mal[place]   Past Medical History:  Diagnosis Date  . Allergy   . Cataract   . Chronic migraine BOTOX INJECTION EVERY 3 MONTHS  . Chronic pain in right shoulder   . Constipation   . Diabetes mellitus   . Disorder of inner ear CAUSES VERTIGO OCCASIONALLY  . Fibromyalgia   . GERD (gastroesophageal reflux disease)   . Hemorrhoid   . History of bladder infections   . History of thyroid cancer 2009  S/P TOTAL THYROIDECTOMY AND RADIATION  . Hyperlipidemia   . Hypertension CARDIOLOGIST- DR TIWynonia LawmanWILL REQUEST LATE NOTE   DENIES S & S  . Hypothyroidism   . Insomnia   . Left shoulder pain   . Macular degeneration   . Normal nuclear stress test 01-25-2009  . OA (osteoarthritis) JOINTS  . PONV (postoperative nausea and vomiting) SEVERE  . Rotator cuff disorder LEFT SHOULDER RTC IMPINGMENT  . Spondylolisthesis of lumbar region   . 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 Wears glasses     Past Surgical History:  Procedure Laterality Date  . BILATERAL CARPAL TUNNEL RELEASE  1994  . BILATERAL ELBOW SURG.  1999  . BILATERAL SALPINGOOPHECTOMY  1993   POST-OP URETER REPAIR 12 DAYS AFTER   . LEFT THUMB JOINT REPLACEMENT  2005   RIGHT DONE IN 2004  . RIGHT KNEE ARTHROSCOPY  X2  BEFORE 2011  . RIGHT KNEE ARTHROSCOPY/ PARTIAL LATERAL MENISECTOMY/ TRICOMPARTMENT CHONDROPLASTY/ DECOMPRESSION CYST  10-26-2009  . RIGHT KNEE CLOSED MANIPULATION  09-11-2010  . RIGHT SHOULDER ARTHROSCOPY  2010  &  2004  . TIBIA CYST REMOVED AND ORIF LEG FX  1958  . TONSILLECTOMY  1968  . TOTAL KNEE ARTHROPLASTY  07-16-2010   RIGHT  . TOTAL THYROIDECTOMY  2009   CANCER  (POST-OP BLEED)  AND RADIATION [place]  . trigger fingers Right 2013   3rd and 4th fingers  . VAGINAL HYSTERECTOMY  1990   Social History   Social History  . Marital status: Married    Spouse name: N/A  . Number of children: N/A  . Years of education: N/A   Social History Main Topics  . Smoking status: Never Smoker  . Smokeless tobacco: Never Used  . Alcohol use Yes     Comment: RARE  . Drug use: No  . Sexual activity: Not Asked   Other Topics Concern  .  None   Social History Narrative  . None   Family History  Problem Relation Age of Onset  . Colon polyps Sister 1  . Alzheimer's disease Mother   . Heart disease Father   . Cirrhosis Father   . Heart attack Father   . Macular degeneration Father   . Colon cancer Neg Hx    Allergies  Allergen Reactions  . Other Other (See Comments)    Artificial sweetners  . Sulfa Antibiotics Hives   Prior to Admission medications   Medication Sig Start Date End Date Taking? Authorizing Provider  aspirin 81 MG tablet Take 81 mg by mouth daily.   Yes Historical Provider, MD  azelastine (ASTELIN) 137 MCG/SPRAY nasal spray Place 2 sprays into both nostrils 2 (two) times daily. Use in each nostril as directed 10/14/13  Yes [name], MD  blood glucose meter kit and supplies  KIT Test blood sugar once daily. Dx code: E11.9 03/08/15  Yes [name], MD  cetirizine (ZYRTEC) 10 MG tablet Take 10 mg by mouth daily as needed for allergies.    Yes Historical Provider, MD  famotidine (PEPCID) 40 MG tablet Take 1 tablet (40 mg total) by mouth daily. 08/10/15  Yes [name], MD  glucose blood test strip Test blood sugar once daily. Dx code: E11.9 03/08/15  Yes [name], MD  Lancets MISC Test blood sugar once daily. Dx code: E11.9 03/08/15  Yes [name], MD  levothyroxine (SYNTHROID, LEVOTHROID) 137 MCG tablet Take 137 mcg by mouth daily before breakfast.   Yes Historical Provider, MD  lidocaine (LIDODERM) 5 % Place 1 patch onto the skin as needed (for pain). Remove & Discard patch within 12 hours or as directed by MD   Yes Historical Provider, MD  losartan (COZAAR) 50 MG tablet One a day 01/22/16  Yes [name], MD  metaxalone (SKELAXIN) 800 MG tablet Take 1 tablet (800 mg total) by mouth 3 (three) times daily. Patient taking differently: Take 800 mg by mouth 3 (three) times daily as needed for muscle spasms.  04/25/15  Yes [name], MD  metFORMIN (GLUCOPHAGE) 1000 MG tablet TAKE 1 TABLET BY MOUTH 2 TIMES DAILY WITH A MEAL. 05/01/15  Yes [name], MD  Methylcellulose, Laxative, (CITRUCEL PO) Take 6 tablets by mouth 2 (two) times daily.   Yes Historical Provider, MD  metoprolol succinate (TOPROL-XL) 25 MG 24 hr tablet Take 1 tablet (25 mg total) by mouth every morning. 01/22/16  Yes [name], MD  mometasone [facility]) 220 MCG/INH inhaler Inhale 2 puffs into the lungs daily. 10/14/13  Yes [name], MD  montelukast (SINGULAIR) 10 MG tablet TAKE 1 TABLET BY MOUTH AT BEDTIME 01/12/16  Yes Nicole V Bush, PA-C  NASONEX 50 MCG/ACT nasal spray PLACE 2 SPRAYS INTO THE NOSE DAILY. 09/20/14  Yes [name], MD  nitrofurantoin (MACRODANTIN) 50 MG capsule Take 50 mg by mouth at bedtime.    Yes Historical Provider, MD  NON FORMULARY    Yes Historical Provider, MD   ondansetron (ZOFRAN) 4 MG tablet TAKE 1 TABLET BY MOUTH EVERY 8 HOURS AS NEEDED FOR NAUSEA AND VOMITING 02/29/16  Yes [name], MD  Probiotic Product (PRO-BIOTIC BLEND) CAPS Take 1 capsule by mouth daily. Reported on 08/10/2015   Yes Historical Provider, MD  RELPAX 20 MG tablet TAKE 1 TABLET BY MOUTH AS NEEDED FOR MIGRAINE OR HEADACHE. MAY REPEAT IN 2 HOURS IF HEADACHE PERSISTS  OR RECURS. 02/29/16  Yes [name], MD  traZODone (DESYREL) 100 MG tablet TAKE 1-1/2-2 TABLETS BY MOUTH AT BEDTIME 02/06/15  Yes [name], MD  TURMERIC PO Take 1 tablet by mouth 2 (two) times daily.   Yes Historical Provider, MD  zolpidem (AMBIEN) 5 MG tablet TAKE 1 TABLET BY MOUTH AT BEDTIME AS NEEDED FOR SLEEP 11/15/15  Yes [name], MD  phenazopyridine (PYRIDIUM) 200 MG tablet Take 1 tablet (200 mg total) by mouth 3 (three) times daily as needed for pain. Patient not taking: Reported on 03/05/2016 06/28/14   Harrison Mons, PA-C     ROS: The patient denies fevers, chills, night sweats, unintentional weight loss, chest pain, palpitations, wheezing, dyspnea on exertion, nausea, vomiting, abdominal pain, dysuria, hematuria, melena, numbness, weakness, or tingling.   All other systems have been reviewed and were otherwise negative with the exception of those mentioned in the HPI and as above.    PHYSICAL EXAM: Vitals:   03/05/16 1219  BP: 124/78  Pulse: 78  Resp: 16  Temp: 98.3 F (36.8 C)   Body mass index is 28.28 kg/m.   General: Alert, no acute distress HEENT:  Normocephalic, atraumatic, oropharynx patent. Eye: Juliette Mangle [facility] Cardiovascular:  Regular rate and rhythm, no rubs murmurs or gallops.  No Carotid bruits, radial pulse intact. No pedal edema. 2+ DP pulses bilat. Respiratory: Clear to auscultation bilaterally.  No wheezes, rales, or rhonchi.  No cyanosis, no use of accessory musculature Abdominal: No organomegaly, abdomen is soft and non-tender, positive bowel sounds.  No  masses. Musculoskeletal: Gait intact. No edema, tenderness Skin: No rashes. No ulceration.  Neurologic: Facial musculature symmetric. Normal sensation in feet bilat.  Psychiatric: Patient acts appropriately throughout our interaction. Lymphatic: No cervical or submandibular lymphadenopathy    LABS:  Results for orders placed or performed in visit on 03/05/16  POCT CBC  Result Value Ref Range   WBC 5.7 4.6 - 10.2 K/uL   Lymph, poc 1.8 0.6 - 3.4   POC LYMPH PERCENT 31.8 10 - 50 %L   MID (cbc) 0.7 0 - 0.9   POC MID % 12.2 (A) 0 - 12 %M   POC Granulocyte 3.2 2 - 6.9   Granulocyte percent 56.0 37 - 80 %G   RBC 4.78 4.04 - 5.48 M/uL   Hemoglobin 13.3 12.2 - 16.2 g/dL   HCT, POC 39.8 37.7 - 47.9 %   MCV 83.2 80 - 97 fL   MCH, POC 27.9 27 - 31.2 pg   MCHC 33.5 31.8 - 35.4 g/dL   RDW, POC 14.6 %   Platelet Count, POC 195 142 - 424 K/uL   MPV 7.4 0 - 99.8 fL  POCT glycosylated hemoglobin (Hb A1C)  Result Value Ref Range   Hemoglobin A1C 7.0   POCT glucose (manual entry)  Result Value Ref Range   POC Glucose 97 70 - 99 mg/dl   Meds ordered this encounter  Medications  . losartan (COZAAR) 50 MG tablet    Sig: Take 1-1/2 pills daily for a dose of 75 mg daily    Dispense:  145 tablet    Refill:  3  . atovaquone-proguanil (MALARONE) 250-100 MG TABS tablet    Sig: Take 1 tablet the day before your trip during your trip and for 7 days on return.    Dispense:  40 tablet    Refill:  0    EKG/XRAY:   Primary read interpreted by Dr. Everlene Farrier at [facility].  ASSESSMENT/PLAN: Hemoglobin A1c is up to 7.0. No changes made today. Recheck in about 3 months. If rising  will add a second agent prescription given for typhoid vaccine. Prescription given for Malarone for malaria prophylaxis. Hep C done today. Her Cozaar dose is going to be 75 mg a day. Basic metabolic panel was done today.I personally performed the services described in this documentation, which was scribed in my presence. The recorded  information has been reviewed and is accurate.Patient advised of interaction with typhoid vaccine and Macrodantin. She will get instructions from the pharmacist about this   Gross sideeffects, risk and benefits, and alternatives of medications d/w patient. Patient is aware that all medications have potential sideeffects and we are unable to predict every sideeffect or drug-drug interaction that may occur.  [name] MD 03/05/2016 12:30 PM

## 2016-03-06 LAB — HEPATITIS C ANTIBODY: HCV AB: NEGATIVE

## 2016-03-06 LAB — BASIC METABOLIC PANEL WITH GFR
BUN: 15 mg/dL (ref 7–25)
CO2: 22 mmol/L (ref 20–31)
CREATININE: 0.57 mg/dL (ref 0.50–0.99)
Calcium: 9.8 mg/dL (ref 8.6–10.4)
Chloride: 101 mmol/L (ref 98–110)
GFR, Est Non African American: >89 mL/min (ref >=60)
GLUCOSE: 101 mg/dL — AB (ref 65–99)
Potassium: 4 mmol/L (ref 3.5–5.3)
Sodium: 138 mmol/L (ref 135–146)

## 2016-03-06 LAB — MICROALBUMIN, URINE: Microalb, Ur: 0.2 mg/dL

## 2016-03-13 ENCOUNTER — Other Ambulatory Visit: Payer: Self-pay | Admitting: Emergency Medicine

## 2016-03-13 MED FILL — ZOLPIDEM TARTRATE 5 MG TAB: 5 | 30 days supply | Qty: 30 | Fill #3

## 2016-03-14 MED FILL — NITROFURANTOIN MCR 50 MG CA: 50 | 30 days supply | Qty: 30 | Fill #0

## 2016-03-17 ENCOUNTER — Other Ambulatory Visit: Payer: Self-pay | Admitting: Emergency Medicine

## 2016-03-17 MED ORDER — ZOLPIDEM TARTRATE 5 MG PO TABS: ORAL_TABLET | ORAL | 1 refills | Status: DC

## 2016-03-17 MED ORDER — TRAZODONE HCL 100 MG PO TABS: ORAL_TABLET | ORAL | 3 refills | Status: DC

## 2016-03-17 MED FILL — traZODone HCL 100 MG TABS: 100 | 90 days supply | Qty: 180 | Fill #0

## 2016-03-20 ENCOUNTER — Ambulatory Visit (INDEPENDENT_AMBULATORY_CARE_PROVIDER_SITE_OTHER): Payer: 59 | Admitting: Ophthalmology

## 2016-03-21 ENCOUNTER — Other Ambulatory Visit: Payer: Self-pay | Admitting: Emergency Medicine

## 2016-03-22 NOTE — Telephone Encounter (Signed)
I do not see any mention of medication in last visit in August  It doesn't look like it has been prescribed since 9/16.  Refill?

## 2016-03-25 ENCOUNTER — Encounter: Payer: Self-pay | Admitting: Neurology

## 2016-03-25 ENCOUNTER — Ambulatory Visit (INDEPENDENT_AMBULATORY_CARE_PROVIDER_SITE_OTHER): Payer: 59 | Admitting: Neurology

## 2016-03-25 VITALS — BP 122/70 | HR 80 | Resp 20 | Ht 68.0 in | Wt 187.0 lb

## 2016-03-25 DIAGNOSIS — R0683 Snoring: Secondary | ICD-10-CM | POA: Diagnosis not present

## 2016-03-25 DIAGNOSIS — R519 Headache, unspecified: Secondary | ICD-10-CM | POA: Insufficient documentation

## 2016-03-25 DIAGNOSIS — R002 Palpitations: Secondary | ICD-10-CM | POA: Diagnosis not present

## 2016-03-25 DIAGNOSIS — R51 Headache: Secondary | ICD-10-CM

## 2016-03-25 NOTE — Progress Notes (Signed)
Provider:  Larey Seat, M D  Referring Provider: [name], MD Primary Care Physician:  Jenny Reichmann, MD  Chief Complaint  Patient presents with  . New Patient (Initial Visit)    snoring, never had sleep study     HPI:  Debbie Hayes is a 65 y.o. female , seen here as a referral from Dr. Everlene Farrier , now for  a sleep evaluation, and headache evaluation.   Chief complaint according to patient :   The patient has experienced for the second time in 4 years left facial dysesthesias this time on March 27, a feeling as if a liquid was dropping down the face from the left temple. This dysesthesia lasted for about 12 hours and returned every other day. She did not have motor weakness with it the patient has a history of steroid-induced diabetes, history of thyroid cancer, surgical hypothyroidism, gastroesophageal reflux disease, chronic migraine, fibromyalgia hyperlipidemia and TMJ syndrome. To treat TMJ, she wears a mouthguard at night she also has a history of bruxism which is prevented by the dental device. The emergency room medical records did indicate that the patient underwent an MRI of the brain which was entirely normal, that she had an EKG which was normal, and multiple laboratory tests. Medical history : 4 years ago the patient had dysesthesias at the left upper lip, returning for several months. About 19 years ago she had left-sided facial zoster. 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 She expects a new (fourth) grandchild to be born early 2018.  One daughter has Downs syndrome.  She has never used any kind of tobacco product, she rarely drinks wine, perhaps 1 glass every other month. She gave up caffeine 4 years ago when her migraines became chronic. She received Botox injections for the treatment of migraine.  Debbie Hayes reports that she has had a recurrence of migraines which used to be treated with Botox  injections. She is concerned that there may be a correlation to her sleep pattern. Usually she sleeps between midnight and 8 AM 8 hours each night. She was diagnosed with fibromyalgia in the early 1990s, and her sleep pattern changed with the fibromyalgia pain. Sleep was less restoring and very fragmented. Since retirement she takes usually at 20 minutes after lunch nap which allows her to stay alert and awake until midnight. The late hours of the day otherwise she can share with her husband.  She reports that once she is in bed at midnight and she will instantly fall asleep. She will stay asleep throughout the night except for one bathroom break. She will use one pillow only, usually sleeps in supine position. The bedroom is cool, quiet and dark she shares a bed with her husband. For background noise the family will be running.no TV . Her husband uses CPAP. He noted her to snore, but he hasn't noted apnea, only some PLMs.    Review of Systems: Out of a complete 14 system review, the patient complains of only the following symptoms, and all other reviewed systems are negative.recurrent dyseasthesias of the left face- only in the middle branch. Which was affected by zoster. Snoring.   Social History   Social History  . Marital status: Married    Spouse name: N/A  . Number of children: N/A  . Years of education: N/A   Occupational History  . Not on file.   Social History Main Topics  .  Smoking status: Never Smoker  . Smokeless tobacco: Never Used  . Alcohol use Yes     Comment: RARE  . Drug use: No  . Sexual activity: Not on file   Other Topics Concern  . Not on file   Social History Narrative  . No narrative on file    Family History  Problem Relation Age of Onset  . Colon polyps Sister 34  . Alzheimer's disease Mother   . Heart disease Father   . Cirrhosis Father   . Heart attack Father   . Macular degeneration Father   . Colon cancer Neg Hx     Past Medical History:    Diagnosis Date  . Allergy   . Cataract   . Chronic migraine BOTOX INJECTION EVERY 3 MONTHS  . Chronic pain in right shoulder   . Constipation   . Diabetes mellitus   . Disorder of inner ear CAUSES VERTIGO OCCASIONALLY  . Fibromyalgia   . GERD (gastroesophageal reflux disease)   . Hemorrhoid   . History of bladder infections   . History of thyroid cancer 2009  S/P TOTAL THYROIDECTOMY AND RADIATION  . Hyperlipidemia   . Hypertension CARDIOLOGIST- DR Wynonia Lawman- WILL REQUEST LATE NOTE   DENIES S & S  . Hypothyroidism   . Insomnia   . Left shoulder pain   . Macular degeneration   . Normal nuclear stress test 01-25-2009  . OA (osteoarthritis) JOINTS  . PONV (postoperative nausea and vomiting) SEVERE  . Rotator cuff disorder LEFT SHOULDER RTC IMPINGMENT  . Spondylolisthesis of lumbar region   . 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 Wears glasses     Past Surgical History:  Procedure Laterality Date  . BILATERAL CARPAL TUNNEL RELEASE  1994  . BILATERAL ELBOW SURG.  1999  . BILATERAL SALPINGOOPHECTOMY  1993   POST-OP URETER REPAIR 12 DAYS AFTER   . LEFT THUMB JOINT REPLACEMENT  2005   RIGHT DONE IN 2004  . RIGHT KNEE ARTHROSCOPY  X2  BEFORE 2011  . RIGHT KNEE ARTHROSCOPY/ PARTIAL LATERAL MENISECTOMY/ TRICOMPARTMENT CHONDROPLASTY/ DECOMPRESSION CYST  10-26-2009  . RIGHT KNEE CLOSED MANIPULATION  09-11-2010  . RIGHT SHOULDER ARTHROSCOPY  2010  &  2004  . TIBIA CYST REMOVED AND ORIF LEG FX  1958  . TONSILLECTOMY  1968  . TOTAL KNEE ARTHROPLASTY  07-16-2010   RIGHT  . TOTAL THYROIDECTOMY  2009   CANCER  (POST-OP BLEED)  AND RADIATION TX  . trigger fingers Right 2013   3rd and 4th fingers  . VAGINAL HYSTERECTOMY  1990    Current Outpatient Prescriptions  Medication Sig Dispense Refill  . aspirin 81 MG tablet Take 81 mg by mouth daily.    Marland Kitchen atovaquone-proguanil (MALARONE) 250-100 MG TABS tablet Take 1 tablet the day before your trip during your trip and for 7 days on return.  40 tablet 0  . azelastine (ASTELIN) 137 MCG/SPRAY nasal spray Place 2 sprays into both nostrils 2 (two) times daily. Use in each nostril as directed 90 mL 3  . blood glucose meter kit and supplies KIT Test blood sugar once daily. Dx code: E11.9 1 each 0  . cetirizine (ZYRTEC) 10 MG tablet Take 10 mg by mouth daily as needed for allergies.     Marland Kitchen glucose blood test strip Test blood sugar once daily. Dx code: E11.9 100 each 3  . Lancets MISC Test blood sugar once daily. Dx code: E11.9 100 each 3  . levothyroxine (SYNTHROID, LEVOTHROID) 137  MCG tablet Take 137 mcg by mouth daily before breakfast.    . lidocaine (LIDODERM) 5 % Place 1 patch onto the skin as needed (for pain). Remove & Discard patch within 12 hours or as directed by MD    . losartan (COZAAR) 50 MG tablet Take 1-1/2 pills daily for a dose of 75 mg daily 145 tablet 3  . metaxalone (SKELAXIN) 800 MG tablet TAKE 1 TABLET BY MOUTH 3 TIMES DAILY. 90 tablet 2  . metFORMIN (GLUCOPHAGE) 1000 MG tablet TAKE 1 TABLET BY MOUTH 2 TIMES DAILY WITH A MEAL. 180 tablet 3  . Methylcellulose, Laxative, (CITRUCEL PO) Take 6 tablets by mouth 2 (two) times daily.    . metoprolol succinate (TOPROL-XL) 25 MG 24 hr tablet Take 1 tablet (25 mg total) by mouth every morning. 90 tablet 3  . mometasone (ASMANEX) 220 MCG/INH inhaler Inhale 2 puffs into the lungs daily. 3 Inhaler 3  . montelukast (SINGULAIR) 10 MG tablet TAKE 1 TABLET BY MOUTH AT BEDTIME 30 tablet 3  . NASONEX 50 MCG/ACT nasal spray PLACE 2 SPRAYS INTO THE NOSE DAILY. 17 g 9  . nitrofurantoin (MACRODANTIN) 50 MG capsule Take 50 mg by mouth at bedtime.     . NON FORMULARY     . ondansetron (ZOFRAN) 4 MG tablet TAKE 1 TABLET BY MOUTH EVERY 8 HOURS AS NEEDED FOR NAUSEA AND VOMITING 30 tablet 1  . phenazopyridine (PYRIDIUM) 200 MG tablet Take 1 tablet (200 mg total) by mouth 3 (three) times daily as needed for pain. 40 tablet 1  . Probiotic Product (PRO-BIOTIC BLEND) CAPS Take 1 capsule by mouth daily.  Reported on 08/10/2015    . RELPAX 20 MG tablet TAKE 1 TABLET BY MOUTH AS NEEDED FOR MIGRAINE OR HEADACHE. MAY REPEAT IN 2 HOURS IF HEADACHE PERSISTS OR RECURS. 9 tablet 1  . traZODone (DESYREL) 100 MG tablet TAKE 1-1/2-2 TABLETS BY MOUTH AT BEDTIME (Patient taking differently: 200 mg. TAKE 1-1/2-2 TABLETS BY MOUTH AT BEDTIME) 180 tablet 3  . TURMERIC PO Take 1 tablet by mouth 2 (two) times daily.    Marland Kitchen zolpidem (AMBIEN) 5 MG tablet Take one half tablet at night for sleep 30 tablet 1   No current facility-administered medications for this visit.     Allergies as of 03/25/2016 - Review Complete 03/25/2016  Allergen Reaction Noted  . Other Other (See Comments) 08/01/2014  . Sulfa antibiotics Hives 09/05/2011    Vitals: BP 122/70   Pulse 80   Resp 20   Ht 5' 8" (1.727 m)   Wt 187 lb (84.8 kg)   BMI 28.43 kg/m  Last Weight:  Wt Readings from Last 1 Encounters:  03/25/16 187 lb (84.8 kg)   JTT:SVXB mass index is 28.43 kg/m.     Last Height:   Ht Readings from Last 1 Encounters:  03/25/16 5' 8" (1.727 m)    Physical exam:  General: The patient is awake, alert and appears not in acute distress. The patient is well groomed. Neck circumference is 14.75 inches, Mallampati grade 2 without lateral restriction. The soft palate is slightly reddened and puffy,attributed to allergic rhinitis.  Allergic rhinitis has been noted almost year around. Head: Normocephalic, atraumatic. Neck is supple.  Cardiovascular:  Regular rate and rhythm, without  murmurs or carotid bruit, and without distended neck veins. Respiratory: Lungs are clear to auscultation. Skin:  Without evidence of edema, or rash Trunk: BMI is elevated. The patient's posture is erect.  Neurologic exam : The patient  is awake and alert, oriented to place and time.   Memory subjective described as intact.  Attention span & concentration ability appears normal.  Speech is fluent, without dysarthria, mild  dysphonia and no aphasia.   Mood and affect are appropriate.  Cranial nerves: Pupils are equal and briskly reactive to light. Funduscopic exam without  evidence of pallor or edema. Extraocular movements  in vertical and horizontal planes intact and without nystagmus. Visual fields by finger perimetry are intact.Hearing to finger rub intact.   Facial sensation the patient describes a numb spot lateral to the left outer angle of the eye and radiating over the zygomatic arch. Never cross the midline. 4 years ago she had numbness on the left upper lip. motor strength is symmetric and tongue and uvula move midline. Shoulder shrug was symmetrical.   Motor exam:  Normal tone, muscle bulk and symmetric strength in all extremities. Sensory:  Fine touch, pinprick and vibration were tested in all extremities. Proprioception tested in the upper extremities was normal. Coordination: Rapid alternating movements in the fingers/hands was normal. Finger-to-nose maneuver  normal without evidence of ataxia, dysmetria or tremor. Gait and station: Patient walks without assistive device and is able unassisted to climb up to the exam table. Strength within normal limits. Stance is stable and normal.   Deep tendon reflexes: in the  upper and lower extremities are symmetric and intact. Babinski maneuver response is downgoing.  Last visit note CD:  Debbie Hayes, a pediatric nurse, was able to give me a very detailed description of the sensory abnormalities that led to her last emergency room visit. 4 years ago she had dysesthesias of the left face centered at the left upper lip now at a Zygomatic arch. She also took a couple of pictures showing that her left upper lip seemed to be slimmer than her right but there is no obliteration of the nasolabial fold. Based on this I do not think that this is a facial nerve involvement but the middle branch of the trigeminal nerve. 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 I like for her to continue her baby aspirin regimen, as she is diabetic I would not like to treat her with steroids, there is no report of taking acyclovir years after visible skin lesions have disappeared. I think it is likely that she will have intermittently every couple of years or so one of the spells. I would like for her to go to the emergency room only if she has a focal neurologic weakness change in her speech, swallowing or taste.    Today's visit was dedicated to further evaluation of possible correlation between chronic migraine and sleep disordered breathing. The patient suffers from allergic rhinitis and may have resumed mouth breathing due to this. Sometimes she wakes up with a headache but she is not aware of a dry mouth. Her voice is hoarse. She sleeps supine. Shoulder pain prevents her from sleeping laterally, in her sleep number bed.She has episodic HTN and palpitation. Optometrist noted a small retinal bleed- perhaps related to HTN. She has elevated HbA1c.   The patient was advised of the nature of the diagnosed disorder , the treatment options and risks for general a health and wellness arising from not treating the condition.  I spent more than 25 minutes of face to face time with the patient.   Greater than 50% of time was spent in counseling and coordination of care. We have discussed the diagnosis and differential and I answered the patient's questions.      Assessment:  After physical and neurologic examination, review of laboratory studies,  Personal review of imaging studies, reports of other /same  Imaging studies ,  Results of  pre-existing records as far as provided in visit., my assessment is   I will invite Debbie Hayes for a polysomnography, if permitted by her insurance carrier. Since she has sleep related headaches and chronic migraines I would prefer and attended  sleep study with oxygen and CO2 Measurements.    Asencion Partridge Norvel Wenker MD  03/25/2016   CC: [name], [place], [place] [address]

## 2016-03-25 NOTE — Patient Instructions (Signed)

## 2016-03-26 ENCOUNTER — Ambulatory Visit (INDEPENDENT_AMBULATORY_CARE_PROVIDER_SITE_OTHER): Payer: 59 | Admitting: Ophthalmology

## 2016-03-26 DIAGNOSIS — I10 Essential (primary) hypertension: Secondary | ICD-10-CM

## 2016-03-26 DIAGNOSIS — H353132 Nonexudative age-related macular degeneration, bilateral, intermediate dry stage: Secondary | ICD-10-CM | POA: Diagnosis not present

## 2016-03-26 DIAGNOSIS — H43813 Vitreous degeneration, bilateral: Secondary | ICD-10-CM | POA: Diagnosis not present

## 2016-03-26 DIAGNOSIS — E11319 Type 2 diabetes mellitus with unspecified diabetic retinopathy without macular edema: Secondary | ICD-10-CM | POA: Diagnosis not present

## 2016-03-26 DIAGNOSIS — H35033 Hypertensive retinopathy, bilateral: Secondary | ICD-10-CM

## 2016-03-26 DIAGNOSIS — E113293 Type 2 diabetes mellitus with mild nonproliferative diabetic retinopathy without macular edema, bilateral: Secondary | ICD-10-CM

## 2016-04-07 MED FILL — LEVOTHYROXINE 137 MCG TABLE: 137 | 90 days supply | Qty: 90 | Fill #1

## 2016-04-22 ENCOUNTER — Other Ambulatory Visit: Payer: Self-pay | Admitting: Emergency Medicine

## 2016-04-22 ENCOUNTER — Other Ambulatory Visit: Payer: Self-pay | Admitting: Physician Assistant

## 2016-04-22 DIAGNOSIS — Z7184 Encounter for health counseling related to travel: Secondary | ICD-10-CM

## 2016-04-22 DIAGNOSIS — E088 Diabetes mellitus due to underlying condition with unspecified complications: Secondary | ICD-10-CM

## 2016-04-22 MED FILL — NITROFURANTOIN MCR 50 MG CA: 50 | 30 days supply | Qty: 30 | Fill #1

## 2016-04-22 MED FILL — METAXALONE 800 MG TABLET: 800 | 30 days supply | Qty: 90 | Fill #0

## 2016-04-22 MED FILL — ELETRIPTAN HBR 20 MG TABLET: 20 | 20 days supply | Qty: 6 | Fill #0

## 2016-04-22 MED FILL — METOPROLOL SUCC ER 25 MG TA: 25 | 90 days supply | Qty: 90 | Fill #1

## 2016-04-28 ENCOUNTER — Other Ambulatory Visit: Payer: Self-pay | Admitting: Urgent Care

## 2016-04-28 DIAGNOSIS — M21612 Bunion of left foot: Secondary | ICD-10-CM | POA: Diagnosis not present

## 2016-04-28 DIAGNOSIS — M19072 Primary osteoarthritis, left ankle and foot: Secondary | ICD-10-CM | POA: Diagnosis not present

## 2016-04-28 DIAGNOSIS — M6702 Short Achilles tendon (acquired), left ankle: Secondary | ICD-10-CM | POA: Diagnosis not present

## 2016-04-28 MED ORDER — TYPHOID VACCINE PO CPDR: 1.0000 | DELAYED_RELEASE_CAPSULE | ORAL | 0 refills | Status: DC

## 2016-04-28 MED FILL — VIVOTIF EC CAPSULE: 8 days supply | Qty: 4 | Fill #0

## 2016-05-05 ENCOUNTER — Ambulatory Visit (INDEPENDENT_AMBULATORY_CARE_PROVIDER_SITE_OTHER): Payer: 59 | Admitting: Neurology

## 2016-05-05 DIAGNOSIS — G4733 Obstructive sleep apnea (adult) (pediatric): Secondary | ICD-10-CM | POA: Diagnosis not present

## 2016-05-05 DIAGNOSIS — R0683 Snoring: Secondary | ICD-10-CM

## 2016-05-07 DIAGNOSIS — Z23 Encounter for immunization: Secondary | ICD-10-CM | POA: Diagnosis not present

## 2016-05-07 MED FILL — metFORMIN HCL 1000 MG TABS: 1000 | 90 days supply | Qty: 180 | Fill #0

## 2016-05-07 MED FILL — MONTELUKAST SOD 10 MG TAB: 10 | 90 days supply | Qty: 90 | Fill #0

## 2016-05-20 ENCOUNTER — Telehealth: Payer: Self-pay

## 2016-05-20 DIAGNOSIS — G4733 Obstructive sleep apnea (adult) (pediatric): Secondary | ICD-10-CM

## 2016-05-20 NOTE — Telephone Encounter (Signed)
I spoke to pt regarding her sleep study results. I advised her that Dr. Brett Fairy reviewed her results and saw that pt has moderate osa with strong positional component, not REM accentuated. No prolonged hypoxemia. This may explain fatigue rather than headaches. I advised pt that Dr. Brett Fairy recommends a full-night, attended, CPAP titration study to optimize therapy. I advised pt to avoid caffeine containing beverages and chocolate.  Pt says that she does not really want to come in for a cpap titration but will if it is necessary. She is wondering if she qualifies for a dental device instead? She is leaving for [place] next week and wants to know something either way before then.  AHI:18.1 5 evens occurred in REM sleep and 152 events in NREM.  REM AHI: 7.9 Non REM AHI: 19.7  Supine AHI: 41.0 Non Supine AHI:2.8  Baseline O2 saturation was 94% with the lowest being 85%. Time spent below 89% saturation equaled 8 minutes.  Dr. Brett Fairy, will you review this and let me know if pt qualifies for a dental device instead of a undergoing a cpap titration?

## 2016-05-20 NOTE — Telephone Encounter (Signed)
  Called patient :  She can qualify for dental device based on these findings, and should avoid sleeping supine.  It may also work for her headaches, but there is no guarantee . She should use the denatl device for 3-4 weeks before re-considering Botox.  . She agreed with a referral to Augustina Mood, DDS  CD

## 2016-05-23 MED FILL — ZOLPIDEM TARTRATE 5 MG TAB: 5 | 60 days supply | Qty: 30 | Fill #0

## 2016-05-23 MED FILL — NITROFURANTOIN MCR 50 MG CA: 50 | 30 days supply | Qty: 30 | Fill #2

## 2016-05-27 MED FILL — ELETRIPTAN HBR 20 MG TABLET: 20 | 30 days supply | Qty: 9 | Fill #1

## 2016-05-30 ENCOUNTER — Other Ambulatory Visit: Payer: Self-pay | Admitting: Orthopedic Surgery

## 2016-06-18 ENCOUNTER — Ambulatory Visit (INDEPENDENT_AMBULATORY_CARE_PROVIDER_SITE_OTHER): Payer: 59 | Admitting: Emergency Medicine

## 2016-06-18 ENCOUNTER — Encounter: Payer: Self-pay | Admitting: Emergency Medicine

## 2016-06-18 VITALS — BP 100/68 | HR 78 | Temp 98.8°F | Resp 16 | Ht 68.0 in | Wt 179.0 lb

## 2016-06-18 DIAGNOSIS — I10 Essential (primary) hypertension: Secondary | ICD-10-CM | POA: Diagnosis not present

## 2016-06-18 DIAGNOSIS — N390 Urinary tract infection, site not specified: Secondary | ICD-10-CM | POA: Diagnosis not present

## 2016-06-18 DIAGNOSIS — I952 Hypotension due to drugs: Secondary | ICD-10-CM | POA: Diagnosis not present

## 2016-06-18 DIAGNOSIS — E119 Type 2 diabetes mellitus without complications: Secondary | ICD-10-CM | POA: Diagnosis not present

## 2016-06-18 MED ORDER — LOSARTAN POTASSIUM 50 MG PO TABS: ORAL_TABLET | ORAL | 3 refills | Status: DC

## 2016-06-18 MED ORDER — SITAGLIPTIN PHOSPHATE 100 MG PO TABS: 100.0000 mg | ORAL_TABLET | Freq: Every day | ORAL | 3 refills | Status: DC

## 2016-06-18 MED FILL — LOSARTAN POTASSIUM 50 MG TA: 50 | 90 days supply | Qty: 90 | Fill #0

## 2016-06-18 MED FILL — JANUVIA 100 MG TABLET: 100 | 90 days supply | Qty: 90 | Fill #0

## 2016-06-18 NOTE — Progress Notes (Signed)
.Patient ID: Debbie Hayes, female   DOB: 27-Jun-1951, 65 y.o.   MRN: [id number]    By signing my name below I, Shelah Lewandowsky, attest that this documentation has been prepared under the direction and in the presence of Lesle Chris, MD. Electonically Signed. Shelah Lewandowsky, Scribe 06/18/2016 at 9:53 AM  Chief Complaint:  Chief Complaint  Patient presents with  . Diabetes    Wants to discuss DM medications  . Hypotension    States her BP has been consistantly low    HPI: Debbie Hayes is a 65 y.o. female who reports to [facility] today complaining of hypotension. States she would like to cut back on her BP medication due to having consistently low BP. Denies feeling lightheaded or dizzy when her BP is low. Pt states she feels mildly fatigued when her BP is low.   Pt cut her metformin in half and started taking invokana and has been having much better blood sugar than when she was just on metformin alone.   Immunization History  Administered Date(s) Administered  . Influenza Split 04/27/2012  . Influenza,inj,Quad PF,36+ Mos 05/25/2013, 04/19/2014, 05/01/2015  . Influenza-Unspecified 04/11/2016  . Pneumococcal Conjugate-13 10/31/2014  . Pneumococcal Polysaccharide-23 05/01/2015  . Tdap 03/10/2013     Past Medical History:  Diagnosis Date  . Allergy   . Cataract   . Chronic migraine BOTOX INJECTION EVERY 3 MONTHS  . Chronic pain in right shoulder   . Constipation   . Diabetes mellitus   . Disorder of inner ear CAUSES VERTIGO OCCASIONALLY  . Fibromyalgia   . GERD (gastroesophageal reflux disease)   . Hemorrhoid   . History of bladder infections   . History of thyroid cancer 2009  S/P TOTAL THYROIDECTOMY AND RADIATION  . Hyperlipidemia   . Hypertension CARDIOLOGIST- DR Donnie Aho- WILL REQUEST LATE NOTE   DENIES S & S  . Hypothyroidism   . Insomnia   . Left shoulder pain   . Macular degeneration   . Normal nuclear stress test 01-25-2009  . OA (osteoarthritis) JOINTS  .  PONV (postoperative nausea and vomiting) SEVERE  . Rotator cuff disorder LEFT SHOULDER RTC IMPINGMENT  . Spondylolisthesis of lumbar region   . 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 Wears glasses    Past Surgical History:  Procedure Laterality Date  . BILATERAL CARPAL TUNNEL RELEASE  1994  . BILATERAL ELBOW SURG.  1999  . BILATERAL SALPINGOOPHECTOMY  1993   POST-OP URETER REPAIR 12 DAYS AFTER   . LEFT THUMB JOINT REPLACEMENT  2005   RIGHT DONE IN 2004  . RIGHT KNEE ARTHROSCOPY  X2  BEFORE 2011  . RIGHT KNEE ARTHROSCOPY/ PARTIAL LATERAL MENISECTOMY/ TRICOMPARTMENT CHONDROPLASTY/ DECOMPRESSION CYST  10-26-2009  . RIGHT KNEE CLOSED MANIPULATION  09-11-2010  . RIGHT SHOULDER ARTHROSCOPY  2010  &  2004  . TIBIA CYST REMOVED AND ORIF LEG FX  1958  . TONSILLECTOMY  1968  . TOTAL KNEE ARTHROPLASTY  07-16-2010   RIGHT  . TOTAL THYROIDECTOMY  2009   CANCER  (POST-OP BLEED)  AND RADIATION TX  . trigger fingers Right 2013   3rd and 4th fingers  . VAGINAL HYSTERECTOMY  1990   Social History   Social History  . Marital status: Married    Spouse name: N/A  . Number of children: N/A  . Years of education: N/A   Social History Main Topics  . Smoking status: Never Smoker  . Smokeless tobacco: Never Used  . Alcohol use Yes  Comment: RARE  . Drug use: No  . Sexual activity: Not on file   Other Topics Concern  . Not on file   Social History Narrative  . No narrative on file   Family History  Problem Relation Age of Onset  . Colon polyps Sister 69  . Alzheimer's disease Mother   . Heart disease Father   . Cirrhosis Father   . Heart attack Father   . Macular degeneration Father   . Colon cancer Neg Hx    Allergies  Allergen Reactions  . Other Other (See Comments)    Artificial sweetners  . Sulfa Antibiotics Hives   Prior to Admission medications   Medication Sig Start Date End Date Taking? Authorizing Provider  acetaminophen (TYLENOL) 500 MG tablet Take 500 mg by  mouth every 6 (six) hours as needed.   Yes Historical Provider, MD  aspirin 81 MG tablet Take 81 mg by mouth daily.   Yes Historical Provider, MD  atovaquone-proguanil (MALARONE) 250-100 MG TABS tablet Take 1 tablet the day before your trip during your trip and for 7 days on return. 03/05/16  Yes [name], MD  azelastine (ASTELIN) 137 MCG/SPRAY nasal spray Place 2 sprays into both nostrils 2 (two) times daily. Use in each nostril as directed 10/14/13  Yes [name], MD  blood glucose meter kit and supplies KIT Test blood sugar once daily. Dx code: E11.9 03/08/15  Yes [name], MD  cetirizine (ZYRTEC) 10 MG tablet Take 10 mg by mouth daily as needed for allergies.    Yes Historical Provider, MD  glucose blood test strip Test blood sugar once daily. Dx code: E11.9 03/08/15  Yes [name], MD  Lancets MISC Test blood sugar once daily. Dx code: E11.9 03/08/15  Yes [name], MD  levothyroxine (SYNTHROID, LEVOTHROID) 137 MCG tablet Take 137 mcg by mouth daily before breakfast.   Yes Historical Provider, MD  lidocaine (LIDODERM) 5 % Place 1 patch onto the skin as needed (for pain). Remove & Discard patch within 12 hours or as directed by MD   Yes Historical Provider, MD  losartan (COZAAR) 50 MG tablet Take 1-1/2 pills daily for a dose of 75 mg daily 03/05/16  Yes [name], MD  metaxalone (SKELAXIN) 800 MG tablet TAKE 1 TABLET BY MOUTH 3 TIMES DAILY. 03/22/16  Yes [name], MD  metFORMIN (GLUCOPHAGE) 1000 MG tablet TAKE 1 TABLET BY MOUTH 2 TIMES DAILY WITH A MEAL. 04/23/16  Yes [name], MD  Methylcellulose, Laxative, (CITRUCEL PO) Take 6 tablets by mouth 2 (two) times daily.   Yes Historical Provider, MD  metoprolol succinate (TOPROL-XL) 25 MG 24 hr tablet Take 1 tablet (25 mg total) by mouth every morning. 01/22/16  Yes [name], MD  mometasone [facility]) 220 MCG/INH inhaler Inhale 2 puffs into the lungs daily. 10/14/13  Yes [name], MD  montelukast (SINGULAIR) 10 MG  tablet TAKE 1 TABLET BY MOUTH AT BEDTIME 04/25/16  Yes Chelle Jeffery, PA-C  NASONEX 50 MCG/ACT nasal spray PLACE 2 SPRAYS INTO THE NOSE DAILY. 09/20/14  Yes [name], MD  nitrofurantoin (MACRODANTIN) 50 MG capsule Take 50 mg by mouth at bedtime.    Yes Historical Provider, MD  NON FORMULARY    Yes Historical Provider, MD  ondansetron (ZOFRAN) 4 MG tablet TAKE 1 TABLET BY MOUTH EVERY 8 HOURS AS NEEDED FOR NAUSEA AND VOMITING 02/29/16  Yes [name], MD  phenazopyridine (  PYRIDIUM) 200 MG tablet Take 1 tablet (200 mg total) by mouth 3 (three) times daily as needed for pain. 06/28/14  Yes Chelle Jeffery, PA-C  Probiotic Product (PRO-BIOTIC BLEND) CAPS Take 1 capsule by mouth daily. Reported on 08/10/2015   Yes Historical Provider, MD  RELPAX 20 MG tablet TAKE 1 TABLET BY MOUTH AS NEEDED FOR MIGRAINE OR HEADACHE. MAY REPEAT IN 2 HOURS IF HEADACHE PERSISTS OR RECURS. 02/29/16  Yes [name], MD  traZODone (DESYREL) 100 MG tablet TAKE 1-1/2-2 TABLETS BY MOUTH AT BEDTIME Patient taking differently: 200 mg. TAKE 1-1/2-2 TABLETS BY MOUTH AT BEDTIME 03/17/16  Yes [name], MD  TURMERIC PO Take 1 tablet by mouth 2 (two) times daily.   Yes Historical Provider, MD  typhoid (VIVOTIF) DR capsule Take 1 capsule by mouth every other day. X 4 doses. 04/28/16  Yes Jaynee Eagles, PA-C  zolpidem (AMBIEN) 5 MG tablet Take one half tablet at night for sleep 03/17/16  Yes [name], MD     ROS: The patient denies fevers, chills, night sweats, unintentional weight loss, chest pain, palpitations, wheezing, dyspnea on exertion, nausea, vomiting, abdominal pain, dysuria, hematuria, melena, numbness, weakness, or tingling.   All other systems have been reviewed and were otherwise negative with the exception of those mentioned in the HPI and as above.    PHYSICAL EXAM: Vitals:   06/18/16 0918  BP: 100/68  Pulse: 78  Resp: 16  Temp: 98.8 F (37.1 C)   Body mass index is 27.22 kg/m.   General: Alert, no  acute distress HEENT:  Normocephalic, atraumatic, oropharynx patent. Eye: Juliette Mangle [facility] Cardiovascular:  Regular rate and rhythm, no rubs murmurs or gallops.  No Carotid bruits, radial pulse intact. No pedal edema.  Respiratory: Clear to auscultation bilaterally.  No wheezes, rales, or rhonchi.  No cyanosis, no use of accessory musculature Abdominal: No organomegaly, abdomen is soft and non-tender, positive bowel sounds.  No masses. Musculoskeletal: Gait intact. No edema, tenderness Skin: No rashes. Neurologic: Facial musculature symmetric. Psychiatric: Patient acts appropriately throughout our interaction. Lymphatic: No cervical or submandibular lymphadenopathy    LABS:    EKG/XRAY:   Primary read interpreted by Dr. Everlene Farrier at [facility].   ASSESSMENT/PLAN: Blood pressures have been running low. Will cut back on losartan to 50 mg instead of 75. If she continues to run a low pressure will make further adjustments. Have added Januvia 100 mg to take along with her metformin. I do not one her on Invokana , because of her history of previous recurrent urinary tract infections. She is on Macrodantin and will need close follow-up of her pulmonary function and chest x-ray. She is going to follow-up with Dr. Edilia Bo in January so no labs were done today.I personally performed the services described in this documentation, which was scribed in my presence. The recorded information has been reviewed and is accurate.   Gross sideeffects, risk and benefits, and alternatives of medications d/w patient. Patient is aware that all medications have potential sideeffects and we are unable to predict every sideeffect or drug-drug interaction that may occur.  [name] MD 06/18/2016 9:38 AM

## 2016-06-18 NOTE — Patient Instructions (Signed)
     IF you received an x-ray today, you will receive an invoice from [facility]. Please contact [facility] at [phone] with questions or concerns regarding your invoice.   IF you received labwork today, you will receive an invoice from [facility]. Please contact [facility] at [phone] with questions or concerns regarding your invoice.   Our billing staff will not be able to assist you with questions regarding bills from these companies.  You will be contacted with the lab results as soon as they are available. The fastest way to get your results is to activate your My Chart account. Instructions are located on the last page of this paperwork. If you have not heard from us regarding the results in 2 weeks, please contact this office.      

## 2016-06-23 ENCOUNTER — Other Ambulatory Visit: Payer: Self-pay | Admitting: Emergency Medicine

## 2016-06-23 DIAGNOSIS — G43001 Migraine without aura, not intractable, with status migrainosus: Secondary | ICD-10-CM

## 2016-06-23 MED FILL — traZODone HCL 100 MG TABS: 100 | 90 days supply | Qty: 180 | Fill #1

## 2016-06-23 MED FILL — METAXALONE 800 MG TABLET: 800 | 30 days supply | Qty: 90 | Fill #1

## 2016-06-23 MED FILL — NITROFURANTOIN MCR 50 MG CA: 50 | 30 days supply | Qty: 30 | Fill #3

## 2016-06-23 MED FILL — ELETRIPTAN HBR 20 MG TABLET: 20 | 10 days supply | Qty: 3 | Fill #2

## 2016-06-25 ENCOUNTER — Telehealth: Payer: Self-pay | Admitting: Emergency Medicine

## 2016-06-25 ENCOUNTER — Other Ambulatory Visit: Payer: Self-pay | Admitting: Emergency Medicine

## 2016-06-25 DIAGNOSIS — G43001 Migraine without aura, not intractable, with status migrainosus: Secondary | ICD-10-CM

## 2016-06-25 MED ORDER — ELETRIPTAN HYDROBROMIDE 20 MG PO TABS: ORAL_TABLET | ORAL | 1 refills | Status: DC

## 2016-06-25 NOTE — Telephone Encounter (Signed)
Patient called requesting a refill of her Relpax she takes for migraines. She was given medication for a month with 1 refill. Further prescriptions will be done through Dr. Lorelei Pont.

## 2016-06-30 ENCOUNTER — Other Ambulatory Visit: Payer: Self-pay | Admitting: Emergency Medicine

## 2016-06-30 DIAGNOSIS — J309 Allergic rhinitis, unspecified: Secondary | ICD-10-CM

## 2016-06-30 MED FILL — LEVOTHYROXINE 137 MCG TABLE: 137 | 90 days supply | Qty: 90 | Fill #2

## 2016-06-30 MED FILL — ELETRIPTAN HBR 20 MG TABLET: 20 | 30 days supply | Qty: 9 | Fill #0

## 2016-06-30 MED FILL — AZELASTINE 0.1% (137 MCG) S: 0.1 | 90 days supply | Qty: 90 | Fill #0

## 2016-06-30 NOTE — Telephone Encounter (Signed)
05/2016 

## 2016-07-07 ENCOUNTER — Encounter (HOSPITAL_BASED_OUTPATIENT_CLINIC_OR_DEPARTMENT_OTHER): Payer: Self-pay | Admitting: *Deleted

## 2016-07-07 ENCOUNTER — Encounter (HOSPITAL_BASED_OUTPATIENT_CLINIC_OR_DEPARTMENT_OTHER)
Admission: RE | Admit: 2016-07-07 | Discharge: 2016-07-07 | Disposition: A | Discharge status: Home / Self Care | Payer: 59 | Source: Ambulatory Visit | Attending: Orthopedic Surgery | Admitting: Orthopedic Surgery

## 2016-07-07 DIAGNOSIS — Z7984 Long term (current) use of oral hypoglycemic drugs: Secondary | ICD-10-CM | POA: Diagnosis not present

## 2016-07-07 DIAGNOSIS — F419 Anxiety disorder, unspecified: Secondary | ICD-10-CM | POA: Diagnosis not present

## 2016-07-07 DIAGNOSIS — I10 Essential (primary) hypertension: Secondary | ICD-10-CM | POA: Diagnosis not present

## 2016-07-07 DIAGNOSIS — E785 Hyperlipidemia, unspecified: Secondary | ICD-10-CM | POA: Diagnosis not present

## 2016-07-07 DIAGNOSIS — Z85850 Personal history of malignant neoplasm of thyroid: Secondary | ICD-10-CM | POA: Diagnosis not present

## 2016-07-07 DIAGNOSIS — M62472 Contracture of muscle, left ankle and foot: Secondary | ICD-10-CM | POA: Diagnosis not present

## 2016-07-07 DIAGNOSIS — Z7982 Long term (current) use of aspirin: Secondary | ICD-10-CM | POA: Diagnosis not present

## 2016-07-07 DIAGNOSIS — G4700 Insomnia, unspecified: Secondary | ICD-10-CM | POA: Diagnosis not present

## 2016-07-07 DIAGNOSIS — M21612 Bunion of left foot: Secondary | ICD-10-CM | POA: Diagnosis not present

## 2016-07-07 DIAGNOSIS — M797 Fibromyalgia: Secondary | ICD-10-CM | POA: Diagnosis not present

## 2016-07-07 DIAGNOSIS — E039 Hypothyroidism, unspecified: Secondary | ICD-10-CM | POA: Diagnosis not present

## 2016-07-07 DIAGNOSIS — E119 Type 2 diabetes mellitus without complications: Secondary | ICD-10-CM | POA: Diagnosis not present

## 2016-07-07 DIAGNOSIS — Z79899 Other long term (current) drug therapy: Secondary | ICD-10-CM | POA: Diagnosis not present

## 2016-07-07 DIAGNOSIS — M19072 Primary osteoarthritis, left ankle and foot: Secondary | ICD-10-CM | POA: Diagnosis not present

## 2016-07-07 DIAGNOSIS — K219 Gastro-esophageal reflux disease without esophagitis: Secondary | ICD-10-CM | POA: Diagnosis not present

## 2016-07-07 LAB — BASIC METABOLIC PANEL
ANION GAP: 9 (ref 5–15)
BUN: 11 mg/dL (ref 6–20)
CHLORIDE: 98 mmol/L — AB (ref 101–111)
CO2: 31 mmol/L (ref 22–32)
Calcium: 9.7 mg/dL (ref 8.9–10.3)
Creatinine, Ser: 0.55 mg/dL (ref 0.44–1.00)
GFR calc Af Amer: >60 mL/min (ref >60)
GFR calc non Af Amer: >60 mL/min (ref >60)
GLUCOSE: 124 mg/dL — AB (ref 65–99)
POTASSIUM: 4.6 mmol/L (ref 3.5–5.1)
Sodium: 138 mmol/L (ref 135–145)

## 2016-07-10 ENCOUNTER — Ambulatory Visit (HOSPITAL_BASED_OUTPATIENT_CLINIC_OR_DEPARTMENT_OTHER)
Admission: RE | Admit: 2016-07-10 | Discharge: 2016-07-10 | Disposition: A | Discharge status: Home / Self Care | Payer: 59 | Source: Ambulatory Visit | Attending: Orthopedic Surgery | Admitting: Orthopedic Surgery

## 2016-07-10 ENCOUNTER — Ambulatory Visit (HOSPITAL_BASED_OUTPATIENT_CLINIC_OR_DEPARTMENT_OTHER): Payer: 59 | Admitting: Certified Registered"

## 2016-07-10 ENCOUNTER — Encounter (HOSPITAL_BASED_OUTPATIENT_CLINIC_OR_DEPARTMENT_OTHER): Payer: Self-pay | Admitting: Certified Registered"

## 2016-07-10 ENCOUNTER — Encounter (HOSPITAL_BASED_OUTPATIENT_CLINIC_OR_DEPARTMENT_OTHER)
Admission: RE | Disposition: A | Discharge status: Home / Self Care | Payer: Self-pay | Source: Ambulatory Visit | Attending: Orthopedic Surgery

## 2016-07-10 DIAGNOSIS — E039 Hypothyroidism, unspecified: Secondary | ICD-10-CM | POA: Diagnosis not present

## 2016-07-10 DIAGNOSIS — M19072 Primary osteoarthritis, left ankle and foot: Secondary | ICD-10-CM | POA: Insufficient documentation

## 2016-07-10 DIAGNOSIS — Z85850 Personal history of malignant neoplasm of thyroid: Secondary | ICD-10-CM | POA: Insufficient documentation

## 2016-07-10 DIAGNOSIS — Z79899 Other long term (current) drug therapy: Secondary | ICD-10-CM | POA: Insufficient documentation

## 2016-07-10 DIAGNOSIS — G4700 Insomnia, unspecified: Secondary | ICD-10-CM | POA: Insufficient documentation

## 2016-07-10 DIAGNOSIS — M6702 Short Achilles tendon (acquired), left ankle: Secondary | ICD-10-CM | POA: Diagnosis not present

## 2016-07-10 DIAGNOSIS — M7500 Adhesive capsulitis of unspecified shoulder: Secondary | ICD-10-CM | POA: Diagnosis not present

## 2016-07-10 DIAGNOSIS — M21612 Bunion of left foot: Secondary | ICD-10-CM | POA: Insufficient documentation

## 2016-07-10 DIAGNOSIS — M797 Fibromyalgia: Secondary | ICD-10-CM | POA: Diagnosis not present

## 2016-07-10 DIAGNOSIS — E785 Hyperlipidemia, unspecified: Secondary | ICD-10-CM | POA: Insufficient documentation

## 2016-07-10 DIAGNOSIS — G8918 Other acute postprocedural pain: Secondary | ICD-10-CM | POA: Diagnosis not present

## 2016-07-10 DIAGNOSIS — I10 Essential (primary) hypertension: Secondary | ICD-10-CM | POA: Insufficient documentation

## 2016-07-10 DIAGNOSIS — K219 Gastro-esophageal reflux disease without esophagitis: Secondary | ICD-10-CM | POA: Diagnosis not present

## 2016-07-10 DIAGNOSIS — M62472 Contracture of muscle, left ankle and foot: Secondary | ICD-10-CM | POA: Insufficient documentation

## 2016-07-10 DIAGNOSIS — Z7984 Long term (current) use of oral hypoglycemic drugs: Secondary | ICD-10-CM | POA: Insufficient documentation

## 2016-07-10 DIAGNOSIS — F419 Anxiety disorder, unspecified: Secondary | ICD-10-CM | POA: Insufficient documentation

## 2016-07-10 DIAGNOSIS — Z7982 Long term (current) use of aspirin: Secondary | ICD-10-CM | POA: Insufficient documentation

## 2016-07-10 DIAGNOSIS — E119 Type 2 diabetes mellitus without complications: Secondary | ICD-10-CM | POA: Insufficient documentation

## 2016-07-10 DIAGNOSIS — M25511 Pain in right shoulder: Secondary | ICD-10-CM | POA: Diagnosis not present

## 2016-07-10 HISTORY — PX: GASTROC RECESSION EXTREMITY: SHX6262

## 2016-07-10 HISTORY — PX: FOOT ARTHRODESIS: SHX1655

## 2016-07-10 LAB — GLUCOSE, CAPILLARY
GLUCOSE-CAPILLARY: 195 mg/dL — AB (ref 65–99)
Glucose-Capillary: 119 mg/dL — ABNORMAL HIGH (ref 65–99)

## 2016-07-10 SURGERY — FUSION, JOINT, FOOT
Anesthesia: General | Site: Leg Lower | Laterality: Left

## 2016-07-10 MED ORDER — SODIUM CHLORIDE 0.9 % IV SOLN: INTRAVENOUS | Status: DC

## 2016-07-10 MED ORDER — VANCOMYCIN HCL 500 MG IV SOLR
INTRAVENOUS | Status: DC | PRN
  Administered 2016-07-10: 500 mg via TOPICAL

## 2016-07-10 MED ORDER — MIDAZOLAM HCL 2 MG/2ML IJ SOLN
INTRAMUSCULAR | Status: AC
  Filled 2016-07-10: qty 2

## 2016-07-10 MED ORDER — PROPOFOL 500 MG/50ML IV EMUL
INTRAVENOUS | Status: AC
  Filled 2016-07-10: qty 50

## 2016-07-10 MED ORDER — PROMETHAZINE HCL 25 MG/ML IJ SOLN: 6.2500 mg | INTRAMUSCULAR | Status: DC | PRN

## 2016-07-10 MED ORDER — SENNA 8.6 MG PO TABS: 2.0000 | ORAL_TABLET | Freq: Two times a day (BID) | ORAL | 0 refills | Status: DC

## 2016-07-10 MED ORDER — HYDROMORPHONE HCL 1 MG/ML IJ SOLN: 0.2500 mg | INTRAMUSCULAR | Status: DC | PRN

## 2016-07-10 MED ORDER — DEXAMETHASONE SODIUM PHOSPHATE 10 MG/ML IJ SOLN
INTRAMUSCULAR | Status: AC
  Filled 2016-07-10: qty 1

## 2016-07-10 MED ORDER — DOCUSATE SODIUM 100 MG PO CAPS: 100.0000 mg | ORAL_CAPSULE | Freq: Two times a day (BID) | ORAL | 0 refills | Status: DC

## 2016-07-10 MED ORDER — BUPIVACAINE HCL (PF) 0.5 % IJ SOLN
INTRAMUSCULAR | Status: AC
  Filled 2016-07-10: qty 30

## 2016-07-10 MED ORDER — LIDOCAINE 2% (20 MG/ML) 5 ML SYRINGE
INTRAMUSCULAR | Status: AC
  Filled 2016-07-10: qty 5

## 2016-07-10 MED ORDER — CHLORHEXIDINE GLUCONATE 4 % EX LIQD: 60.0000 mL | Freq: Once | CUTANEOUS | Status: DC

## 2016-07-10 MED ORDER — ONDANSETRON HCL 4 MG/2ML IJ SOLN
INTRAMUSCULAR | Status: AC
Start: 2016-07-10 — End: 2016-07-10
  Filled 2016-07-10: qty 2

## 2016-07-10 MED ORDER — SCOPOLAMINE 1 MG/3DAYS TD PT72
1.0000 | MEDICATED_PATCH | Freq: Once | TRANSDERMAL | Status: DC | PRN
  Administered 2016-07-10: 1.5 mg via TRANSDERMAL

## 2016-07-10 MED ORDER — ASPIRIN EC 81 MG PO TBEC
81.0000 mg | DELAYED_RELEASE_TABLET | Freq: Two times a day (BID) | ORAL | 0 refills | Status: DC
Start: 2016-07-10 — End: 2017-04-27

## 2016-07-10 MED ORDER — PROPOFOL 10 MG/ML IV BOLUS
INTRAVENOUS | Status: DC | PRN
  Administered 2016-07-10: 200 mg via INTRAVENOUS

## 2016-07-10 MED ORDER — BUPIVACAINE-EPINEPHRINE (PF) 0.25% -1:200000 IJ SOLN
INTRAMUSCULAR | Status: AC
  Filled 2016-07-10: qty 30

## 2016-07-10 MED ORDER — CEFAZOLIN SODIUM-DEXTROSE 2-4 GM/100ML-% IV SOLN
INTRAVENOUS | Status: AC
  Filled 2016-07-10: qty 100

## 2016-07-10 MED ORDER — MIDAZOLAM HCL 2 MG/2ML IJ SOLN: 1.0000 mg | INTRAMUSCULAR | Status: DC | PRN

## 2016-07-10 MED ORDER — BUPIVACAINE-EPINEPHRINE (PF) 0.5% -1:200000 IJ SOLN
INTRAMUSCULAR | Status: DC | PRN
  Administered 2016-07-10: 30 mL via PERINEURAL

## 2016-07-10 MED ORDER — OXYCODONE HCL 5 MG PO TABS: 5.0000 mg | ORAL_TABLET | ORAL | 0 refills | Status: DC | PRN

## 2016-07-10 MED ORDER — DEXAMETHASONE SODIUM PHOSPHATE 10 MG/ML IJ SOLN
INTRAMUSCULAR | Status: DC | PRN
  Administered 2016-07-10 (×2): 4 mg via INTRAVENOUS

## 2016-07-10 MED ORDER — LACTATED RINGERS IV SOLN
INTRAVENOUS | Status: DC
  Administered 2016-07-10 (×2): via INTRAVENOUS

## 2016-07-10 MED ORDER — EPHEDRINE SULFATE 50 MG/ML IJ SOLN
INTRAMUSCULAR | Status: DC | PRN
  Administered 2016-07-10 (×2): 10 mg via INTRAVENOUS

## 2016-07-10 MED ORDER — SCOPOLAMINE 1 MG/3DAYS TD PT72
MEDICATED_PATCH | TRANSDERMAL | Status: AC
  Filled 2016-07-10: qty 1

## 2016-07-10 MED ORDER — FENTANYL CITRATE (PF) 100 MCG/2ML IJ SOLN: 50.0000 ug | INTRAMUSCULAR | Status: DC | PRN

## 2016-07-10 MED ORDER — FENTANYL CITRATE (PF) 100 MCG/2ML IJ SOLN
INTRAMUSCULAR | Status: AC
  Filled 2016-07-10: qty 2

## 2016-07-10 MED ORDER — CEFAZOLIN SODIUM-DEXTROSE 2-4 GM/100ML-% IV SOLN
2.0000 g | INTRAVENOUS | Status: AC
  Administered 2016-07-10: 2 g via INTRAVENOUS

## 2016-07-10 MED ORDER — 0.9 % SODIUM CHLORIDE (POUR BTL) OPTIME
TOPICAL | Status: DC | PRN
Start: 2016-07-10 — End: 2016-07-10
  Administered 2016-07-10: 1000 mL

## 2016-07-10 MED FILL — oxyCODONE HCL 5 MG TABS: 5 | 3 days supply | Qty: 30 | Fill #0

## 2016-07-10 SURGICAL SUPPLY — 73 items
BANDAGE ESMARK 6X9 LF (GAUZE/BANDAGES/DRESSINGS) ×2 IMPLANT
BIT DRILL 2.5X2.75 QC CALB (BIT) ×4 IMPLANT
BIT DRILL 2.9 CANN QC NONSTRL (BIT) ×4 IMPLANT
BLADE AVERAGE 25MMX9MM (BLADE) ×1
BLADE AVERAGE 25X9 (BLADE) ×3 IMPLANT
BLADE MICRO SAGITTAL (BLADE) ×4 IMPLANT
BLADE SURG 15 STRL LF DISP TIS (BLADE) ×6 IMPLANT
BLADE SURG 15 STRL SS (BLADE) ×6
BNDG COHESIVE 4X5 TAN STRL (GAUZE/BANDAGES/DRESSINGS) ×4 IMPLANT
BNDG COHESIVE 6X5 TAN STRL LF (GAUZE/BANDAGES/DRESSINGS) ×4 IMPLANT
BNDG ESMARK 6X9 LF (GAUZE/BANDAGES/DRESSINGS) ×4
CHLORAPREP W/TINT 26ML (MISCELLANEOUS) ×4 IMPLANT
COVER BACK TABLE 60X90IN (DRAPES) ×4 IMPLANT
CUFF TOURNIQUET SINGLE 34IN LL (TOURNIQUET CUFF) ×4 IMPLANT
DRAPE EXTREMITY T 121X128X90 (DRAPE) ×4 IMPLANT
DRAPE OEC MINIVIEW 54X84 (DRAPES) ×4 IMPLANT
DRAPE U-SHAPE 47X51 STRL (DRAPES) ×4 IMPLANT
DRSG MEPITEL 4X7.2 (GAUZE/BANDAGES/DRESSINGS) ×4 IMPLANT
DRSG PAD ABDOMINAL 8X10 ST (GAUZE/BANDAGES/DRESSINGS) ×8 IMPLANT
ELECT REM PT RETURN 9FT ADLT (ELECTROSURGICAL) ×4
ELECTRODE REM PT RTRN 9FT ADLT (ELECTROSURGICAL) ×2 IMPLANT
GAUZE SPONGE 4X4 12PLY STRL (GAUZE/BANDAGES/DRESSINGS) ×4 IMPLANT
GLOVE BIO SURGEON STRL SZ8 (GLOVE) ×4 IMPLANT
GLOVE BIOGEL PI IND STRL 7.0 (GLOVE) ×4 IMPLANT
GLOVE BIOGEL PI IND STRL 8 (GLOVE) ×4 IMPLANT
GLOVE BIOGEL PI INDICATOR 7.0 (GLOVE) ×4
GLOVE BIOGEL PI INDICATOR 8 (GLOVE) ×4
GLOVE ECLIPSE 6.5 STRL STRAW (GLOVE) ×4 IMPLANT
GLOVE ECLIPSE 7.5 STRL STRAW (GLOVE) ×4 IMPLANT
GOWN STRL REUS W/ TWL LRG LVL3 (GOWN DISPOSABLE) ×2 IMPLANT
GOWN STRL REUS W/ TWL XL LVL3 (GOWN DISPOSABLE) ×4 IMPLANT
GOWN STRL REUS W/TWL LRG LVL3 (GOWN DISPOSABLE) ×2
GOWN STRL REUS W/TWL XL LVL3 (GOWN DISPOSABLE) ×4
K-WIRE .062X4 (WIRE) IMPLANT
K-WIRE ACE 1.6X6 (WIRE) ×8
KWIRE ACE 1.6X6 (WIRE) ×4 IMPLANT
NDL SAFETY ECLIPSE 18X1.5 (NEEDLE) IMPLANT
NEEDLE HYPO 18GX1.5 SHARP (NEEDLE)
NEEDLE HYPO 22GX1.5 SAFETY (NEEDLE) IMPLANT
PACK BASIN DAY SURGERY FS (CUSTOM PROCEDURE TRAY) ×4 IMPLANT
PAD CAST 4YDX4 CTTN HI CHSV (CAST SUPPLIES) ×2 IMPLANT
PADDING CAST ABS 4INX4YD NS (CAST SUPPLIES)
PADDING CAST ABS COTTON 4X4 ST (CAST SUPPLIES) IMPLANT
PADDING CAST COTTON 4X4 STRL (CAST SUPPLIES) ×2
PADDING CAST COTTON 6X4 STRL (CAST SUPPLIES) ×4 IMPLANT
PENCIL BUTTON HOLSTER BLD 10FT (ELECTRODE) ×4 IMPLANT
SANITIZER HAND PURELL 535ML FO (MISCELLANEOUS) ×4 IMPLANT
SCREW 3.5MM CORT LP 34MM (Screw) ×4 IMPLANT
SCREW ACE CAN 4.0 46M (Screw) ×4 IMPLANT
SCREW ACE CAN 4.0 48M (Screw) ×4 IMPLANT
SCREW CORT 3.5X40 (Screw) ×4 IMPLANT
SCREW LP 3.5 (Screw) ×4 IMPLANT
SCREW NL LP 36X3.5 (Screw) ×4 IMPLANT
SHEET MEDIUM DRAPE 40X70 STRL (DRAPES) ×8 IMPLANT
SLEEVE SCD COMPRESS KNEE MED (MISCELLANEOUS) ×4 IMPLANT
SPLINT FAST PLASTER 5X30 (CAST SUPPLIES) ×40
SPLINT PLASTER CAST FAST 5X30 (CAST SUPPLIES) ×40 IMPLANT
SPONGE LAP 18X18 X RAY DECT (DISPOSABLE) ×4 IMPLANT
SPONGE SURGIFOAM ABS GEL 12-7 (HEMOSTASIS) IMPLANT
STOCKINETTE 6  STRL (DRAPES) ×2
STOCKINETTE 6 STRL (DRAPES) ×2 IMPLANT
SUCTION FRAZIER HANDLE 10FR (MISCELLANEOUS) ×2
SUCTION TUBE FRAZIER 10FR DISP (MISCELLANEOUS) ×2 IMPLANT
SUT ETHILON 3 0 PS 1 (SUTURE) ×8 IMPLANT
SUT MNCRL AB 3-0 PS2 18 (SUTURE) ×4 IMPLANT
SUT VIC AB 0 SH 27 (SUTURE) IMPLANT
SUT VIC AB 2-0 SH 27 (SUTURE) ×2
SUT VIC AB 2-0 SH 27XBRD (SUTURE) ×2 IMPLANT
SYR BULB 3OZ (MISCELLANEOUS) ×4 IMPLANT
TOWEL OR 17X24 6PK STRL BLUE (TOWEL DISPOSABLE) ×8 IMPLANT
TUBE CONNECTING 20'X1/4 (TUBING) ×1
TUBE CONNECTING 20X1/4 (TUBING) ×3 IMPLANT
UNDERPAD 30X30 (UNDERPADS AND DIAPERS) ×4 IMPLANT

## 2016-07-10 NOTE — H&P (Signed)
Debbie Hayes is an 65 y.o. female.   Chief Complaint: left foot pain HPI:  65 y/o female with a long h/o left foot pain.  She has a painful bunion and midfoot arthritis.  She has failed non op treatment and presents today for left bunion correction and 2nd TMT joint arthritis.  Past Medical History:  Diagnosis Date  . Allergy   . Anxiety   . Cataract   . Chronic migraine BOTOX INJECTION EVERY 3 MONTHS  . Chronic pain in right shoulder   . Constipation   . Diabetes mellitus   . Disorder of inner ear CAUSES VERTIGO OCCASIONALLY  . Fibromyalgia   . GERD (gastroesophageal reflux disease)   . Hemorrhoid   . History of bladder infections   . History of thyroid cancer 2009  S/P TOTAL THYROIDECTOMY AND RADIATION  . Hyperlipidemia   . Hypertension CARDIOLOGIST- DR Donnie Aho- WILL REQUEST LATE NOTE   DENIES S & S  . Hypothyroidism   . Insomnia   . Left shoulder pain   . Macular degeneration   . Normal nuclear stress test 01-25-2009  . OA (osteoarthritis) JOINTS  . PONV (postoperative nausea and vomiting) SEVERE  . Rotator cuff disorder LEFT SHOULDER RTC IMPINGMENT  . Spondylolisthesis of lumbar region   . 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 Wears glasses     Past Surgical History:  Procedure Laterality Date  . BILATERAL CARPAL TUNNEL RELEASE  1994  . BILATERAL ELBOW SURG.  1999  . BILATERAL SALPINGOOPHECTOMY  1993   POST-OP URETER REPAIR 12 DAYS AFTER   . LEFT THUMB JOINT REPLACEMENT  2005   RIGHT DONE IN 2004  . RIGHT KNEE ARTHROSCOPY  X2  BEFORE 2011  . RIGHT KNEE ARTHROSCOPY/ PARTIAL LATERAL MENISECTOMY/ TRICOMPARTMENT CHONDROPLASTY/ DECOMPRESSION CYST  10-26-2009  . RIGHT KNEE CLOSED MANIPULATION  09-11-2010  . RIGHT SHOULDER ARTHROSCOPY  2010  &  2004  . TIBIA CYST REMOVED AND ORIF LEG FX  1958  . TONSILLECTOMY  1968  . TOTAL KNEE ARTHROPLASTY  07-16-2010   RIGHT  . TOTAL THYROIDECTOMY  2009   CANCER  (POST-OP BLEED)  AND RADIATION TX  . trigger fingers Right  2013   3rd and 4th fingers  . VAGINAL HYSTERECTOMY  1990    Family History  Problem Relation Age of Onset  . Colon polyps Sister 55  . Alzheimer's disease Mother   . Heart disease Father   . Cirrhosis Father   . Heart attack Father   . Macular degeneration Father   . Colon cancer Neg Hx    Social History:  reports that she has never smoked. She has never used smokeless tobacco. She reports that she drinks alcohol. She reports that she does not use drugs.  Allergies:  Allergies  Allergen Reactions  . Ciprofloxacin Diarrhea  . Other Other (See Comments)    Artificial sweetners  . Sulfa Antibiotics Hives    Medications Prior to Admission  Medication Sig Dispense Refill  . acetaminophen (TYLENOL) 500 MG tablet Take 500 mg by mouth every 6 (six) hours as needed.    Marland Kitchen aspirin 81 MG tablet Take 81 mg by mouth daily.    Marland Kitchen azelastine (ASTELIN) 0.1 % nasal spray PLACE 2 SPRAYS INTO BOTH NOSTRILS 2 TIMES DAILY AS DIRECTED 90 mL 0  . cetirizine (ZYRTEC) 10 MG tablet Take 10 mg by mouth daily as needed for allergies.     Marland Kitchen eletriptan (RELPAX) 20 MG tablet TAKE 1 TABLET BY  MOUTH AS NEEDED FOR MIGRAINE OR HEADACHE. MAY REPEAT IN 2 HOURS IF HEADACHE PERSISTS OR RECURS. 9 tablet 1  . levothyroxine (SYNTHROID, LEVOTHROID) 137 MCG tablet Take 137 mcg by mouth daily before breakfast.    . losartan (COZAAR) 50 MG tablet Take 1 tablet daily (Patient taking differently: 75 mg. Take 1 tablet daily) 90 tablet 3  . metaxalone (SKELAXIN) 800 MG tablet TAKE 1 TABLET BY MOUTH 3 TIMES DAILY. 90 tablet 2  . metFORMIN (GLUCOPHAGE) 1000 MG tablet TAKE 1 TABLET BY MOUTH 2 TIMES DAILY WITH A MEAL. 180 tablet 0  . metoprolol succinate (TOPROL-XL) 25 MG 24 hr tablet Take 1 tablet (25 mg total) by mouth every morning. 90 tablet 3  . montelukast (SINGULAIR) 10 MG tablet TAKE 1 TABLET BY MOUTH AT BEDTIME 90 tablet 0  . NASONEX 50 MCG/ACT nasal spray PLACE 2 SPRAYS INTO THE NOSE DAILY. 17 g 9  . nitrofurantoin  (MACRODANTIN) 50 MG capsule Take 50 mg by mouth at bedtime.     . Nutritional Supplements (QUINOA KALE & HEMP PO) Take by mouth.    . sitaGLIPtin (JANUVIA) 100 MG tablet Take 1 tablet (100 mg total) by mouth daily. 90 tablet 3  . traZODone (DESYREL) 100 MG tablet TAKE 1-1/2-2 TABLETS BY MOUTH AT BEDTIME (Patient taking differently: 200 mg. TAKE 1-1/2-2 TABLETS BY MOUTH AT BEDTIME) 180 tablet 3  . TURMERIC PO Take 1 tablet by mouth 2 (two) times daily.    Marland Kitchen zolpidem (AMBIEN) 5 MG tablet Take one half tablet at night for sleep 30 tablet 1  . blood glucose meter kit and supplies KIT Test blood sugar once daily. Dx code: E11.9 1 each 0  . lidocaine (LIDODERM) 5 % Place 1 patch onto the skin as needed (for pain). Remove & Discard patch within 12 hours or as directed by MD    . Methylcellulose, Laxative, (CITRUCEL PO) Take 6 tablets by mouth 2 (two) times daily.    . mometasone (ASMANEX) 220 MCG/INH inhaler Inhale 2 puffs into the lungs daily. 3 Inhaler 3  . Multiple Vitamins-Minerals (ICAPS AREDS 2) CAPS Take by mouth.    . NON FORMULARY     . phenazopyridine (PYRIDIUM) 200 MG tablet Take 1 tablet (200 mg total) by mouth 3 (three) times daily as needed for pain. 40 tablet 1    Results for orders placed or performed during the hospital encounter of 07/10/16 (from the past 48 hour(s))  Glucose, capillary     Status: Abnormal   Collection Time: 07/10/16  6:51 AM  Result Value Ref Range   Glucose-Capillary 119 (H) 65 - 99 mg/dL   No results found.  ROS  No recent f/c/n/v/wt loss  Blood pressure 122/72, pulse 73, temperature 98.4 F (36.9 C), temperature source Oral, resp. rate 16, height '5\' 8"'$  (1.727 m), weight 81.9 kg (180 lb 9.6 oz), SpO2 97 %. Physical Exam  wn wd woman in nad.  A and O x 4.  Mood and affect normal.  EOMI.  Resp unlabored.  L foot with healthy skin.  No lymphadenopathy.  5/5 strength in PF and DF of the ankle and toes.  Sens to LT intact dorsally and plantarly.  TTP over 1st  and 2nd TMT joints.  Heelcord is tight.  Assessment/Plan L bunion and midfoot arthritis - to OR for L modified McBride and Lapidus bunion corrections and 2nd TMT joint arthrodesis.  She may need a gastroc recession as well.  The risks and benefits of the  alternative treatment options have been discussed in detail.  The patient wishes to proceed with surgery and specifically understands risks of bleeding, infection, nerve damage, blood clots, need for additional surgery, amputation and death.   Wylene Simmer, MD August 06, 2016, 7:26 AM

## 2016-07-10 NOTE — Anesthesia Procedure Notes (Signed)
Procedure Name: LMA Insertion Date/Time: 07/10/2016 7:38 AM Performed by: Dominico Rod D Pre-anesthesia Checklist: Patient identified, Emergency Drugs available, Suction available and Patient being monitored Patient Re-evaluated:Patient Re-evaluated prior to inductionOxygen Delivery Method: Circle system utilized Preoxygenation: Pre-oxygenation with 100% oxygen Intubation Type: IV induction Ventilation: Mask ventilation without difficulty LMA: LMA inserted LMA Size: 4.0 Number of attempts: 1 Airway Equipment and Method: Bite block Placement Confirmation: positive ETCO2 Tube secured with: Tape Dental Injury: Teeth and Oropharynx as per pre-operative assessment

## 2016-07-10 NOTE — Progress Notes (Signed)
Assisted Dr. Singer with left, ultrasound guided, popliteal/saphenous block. Side rails up, monitors on throughout procedure. See vital signs in flow sheet. Tolerated Procedure well. 

## 2016-07-10 NOTE — Discharge Instructions (Addendum)
Debbie Simmer, MD [place]  Please read the following information regarding your care after surgery.  Medications  You only need a prescription for the narcotic pain medicine (ex. oxycodone, Percocet, Norco).  All of the other medicines listed below are available over the counter. X acetominophen (Tylenol) 650 mg every 4-6 hours as you need for minor pain X oxycodone as prescribed for moderate to severe pain X aleve 220 mg - 2 tablets with food twice daily as needed for food.   Narcotic pain medicine (ex. oxycodone, Percocet, Vicodin) will cause constipation.  To prevent this problem, take the following medicines while you are taking any pain medicine. X docusate sodium (Colace) 100 mg twice a day X senna (Senokot) 2 tablets twice a day  X To help prevent blood clots, take a baby aspirin (81 mg) twice a day after surgery until you are allowed to initiate weightbearing.  You should also get up every hour while you are awake to move around.    Weight Bearing X Do not bear any weight on the operated leg or foot.  Cast / Splint / Dressing X Keep your splint or cast clean and dry.  Dont put anything (coat hanger, pencil, etc) down inside of it.  If it gets damp, use a hair dryer on the cool setting to dry it.  If it gets soaked, call the office to schedule an appointment for a cast change.   After your dressing, cast or splint is removed; you may shower, but do not soak or scrub the wound.  Allow the water to run over it, and then gently pat it dry.  Swelling It is normal for you to have swelling where you had surgery.  To reduce swelling and pain, keep your toes above your nose for at least 3 days after surgery.  It may be necessary to keep your foot or leg elevated for several weeks.  If it hurts, it should be elevated.  Follow Up Call my office at [phone] when you are discharged from the hospital or surgery center to schedule an appointment to be seen two weeks after  surgery.  Call my office at [phone] if you develop a fever >101.5 F, nausea, vomiting, bleeding from the surgical site or severe pain.     Regional Anesthesia Blocks  1. Numbness or the inability to move the "blocked" extremity may last from 3-48 hours after placement. The length of time depends on the medication injected and your individual response to the medication. If the numbness is not going away after 48 hours, call your surgeon.  2. The extremity that is blocked will need to be protected until the numbness is gone and the  Strength has returned. Because you cannot feel it, you will need to take extra care to avoid injury. Because it may be weak, you may have difficulty moving it or using it. You may not know what position it is in without looking at it while the block is in effect.  3. For blocks in the legs and feet, returning to weight bearing and walking needs to be done carefully. You will need to wait until the numbness is entirely gone and the strength has returned. You should be able to move your leg and foot normally before you try and bear weight or walk. You will need someone to be with you when you first try to ensure you do not fall and possibly risk injury.  4. Bruising and tenderness at the needle site  are common side effects and will resolve in a few days.  5. Persistent numbness or new problems with movement should be communicated to the surgeon or the [place] [phone] [place] [phone]).    Post Anesthesia Home Care Instructions  Activity: Get plenty of rest for the remainder of the day. A responsible adult should stay with you for 24 hours following the procedure.  For the next 24 hours, DO NOT: -Drive a car -Paediatric nurse -Drink alcoholic beverages -Take any medication unless instructed by your physician -Make any legal decisions or sign important papers.  Meals: Start with liquid foods such as gelatin or  soup. Progress to regular foods as tolerated. Avoid greasy, spicy, heavy foods. If nausea and/or vomiting occur, drink only clear liquids until the nausea and/or vomiting subsides. Call your physician if vomiting continues.  Special Instructions/Symptoms: Your throat may feel dry or sore from the anesthesia or the breathing tube placed in your throat during surgery. If this causes discomfort, gargle with warm salt water. The discomfort should disappear within 24 hours.  If you had a scopolamine patch placed behind your ear for the management of post- operative nausea and/or vomiting:  1. The medication in the patch is effective for 72 hours, after which it should be removed.  Wrap patch in a tissue and discard in the trash. Wash hands thoroughly with soap and water. 2. You may remove the patch earlier than 72 hours if you experience unpleasant side effects which may include dry mouth, dizziness or visual disturbances. 3. Avoid touching the patch. Wash your hands with soap and water after contact with the patch.

## 2016-07-10 NOTE — Brief Op Note (Signed)
07/10/2016  9:37 AM  PATIENT:  Debbie Hayes  65 y.o. 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  Left gastrocnemius recession 2.  Left modified McBride bunionectomy (separate incision) 3.  Left Lapidus 1st TMT arthrodesis 4.  Left 2nd TMT arthrodesis 5.  Left foot PA, lateral and oblique xrays  SURGEON:  Wylene Simmer, MD  ASSISTANT: Mechele Claude, PA-C  ANESTHESIA:   General, regional  EBL:  minimal   TOURNIQUET:   Total Tourniquet Time Documented: Thigh (Left) - 88 minutes Total: Thigh (Left) - 88 minutes  COMPLICATIONS:  None apparent  DISPOSITION:  Extubated, awake and stable to recovery.  DICTATION ID:  [id number]

## 2016-07-10 NOTE — Anesthesia Postprocedure Evaluation (Signed)
Anesthesia Post Note  Patient: Debbie Hayes  Procedure(s) Performed: Procedure(s) (LRB): LEFT 2ND TARSAL METATARSAL ARTHRODESIS  GASTROC RECESSION LEFT LAPIDUS MODIFIED MCBRIDE BUIONECTOMY (Left) GASTROC RECESSION EXTREMITY (Left)  Patient location during evaluation: PACU Anesthesia Type: General Level of consciousness: sedated Pain management: pain level controlled Vital Signs Assessment: post-procedure vital signs reviewed and stable Respiratory status: spontaneous breathing and respiratory function stable Cardiovascular status: stable Anesthetic complications: no    Last Vitals:  Vitals:   07/10/16 0945 07/10/16 1000  BP: (!) 142/72 137/63  Pulse: 88 84  Resp: 20 10  Temp:      Last Pain:  Vitals:   07/10/16 1000  TempSrc:   PainSc: 0-No pain    LLE Motor Response: Purposeful movement (cant move toes but can move lower leg) (07/10/16 1000)            Lorre Opdahl DANIEL

## 2016-07-10 NOTE — Anesthesia Procedure Notes (Signed)
Anesthesia Regional Block:  Popliteal block  Pre-Anesthetic Checklist: ,, timeout performed, Correct Patient, Correct Site, Correct Laterality, Correct Procedure, Correct Position, site marked, Risks and benefits discussed,  Surgical consent,  Pre-op evaluation,  At surgeon's request and post-op pain management  Laterality: Left  Prep: chloraprep       Needles:  Injection technique: Single-shot  Needle Type: Echogenic Stimulator Needle     Needle Length:cm 9 cm Needle Gauge: 21 G    Additional Needles:  Procedures: ultrasound guided (picture in chart) and nerve stimulator Popliteal block  Nerve Stimulator or Paresthesia:  Response: plantar flexion, 0.45 mA,   Additional Responses:   Narrative:  Start time: 07/10/2016 7:11 AM End time: 07/10/2016 7:21 AM Injection made incrementally with aspirations every 5 mL.  Performed by: Personally  Anesthesiologist: Duane Boston  Additional Notes: A functioning IV was confirmed and monitors were applied.  Sterile prep and drape, hand hygiene and sterile gloves were used.  Negative aspiration and test dose prior to incremental administration of local anesthetic. The patient tolerated the procedure well.Ultrasound  guidance: relevant anatomy identified, needle position confirmed, local anesthetic spread visualized around nerve(s), vascular puncture avoided.  Image printed for medical record.

## 2016-07-10 NOTE — Anesthesia Preprocedure Evaluation (Addendum)
Anesthesia Evaluation  Patient identified by MRN, date of birth, ID band Patient awake    Reviewed: Allergy & Precautions, NPO status , Patient's Chart, lab work & pertinent test results  History of Anesthesia Complications (+) PONV  Airway Mallampati: II  TM Distance: >3 FB Neck ROM: Full    Dental no notable dental hx. (+) Dental Advisory Given   Pulmonary neg pulmonary ROS,    Pulmonary exam normal        Cardiovascular hypertension, Pt. on medications Normal cardiovascular exam     Neuro/Psych  Headaches, Anxiety  Neuromuscular disease    GI/Hepatic Neg liver ROS, GERD  Medicated,  Endo/Other  diabetes, Type 2, Oral Hypoglycemic AgentsHypothyroidism   Renal/GU negative Renal ROS     Musculoskeletal  (+) Arthritis , Fibromyalgia -  Abdominal   Peds  Hematology   Anesthesia Other Findings   Reproductive/Obstetrics                            Anesthesia Physical Anesthesia Plan  ASA: III  Anesthesia Plan: General   Post-op Pain Management: GA combined w/ Regional for post-op pain   Induction: Intravenous  Airway Management Planned: LMA  Additional Equipment:   Intra-op Plan:   Post-operative Plan: Extubation in OR  Informed Consent: I have reviewed the patients History and Physical, chart, labs and discussed the procedure including the risks, benefits and alternatives for the proposed anesthesia with the patient or authorized representative who has indicated his/her understanding and acceptance.   Dental advisory given  Plan Discussed with: CRNA, Anesthesiologist and Surgeon  Anesthesia Plan Comments:         Anesthesia Quick Evaluation

## 2016-07-10 NOTE — Transfer of Care (Signed)
Immediate Anesthesia Transfer of Care Note  Patient: Debbie Hayes  Procedure(s) Performed: Procedure(s): LEFT 2ND TARSAL METATARSAL ARTHRODESIS  GASTROC RECESSION LEFT LAPIDUS MODIFIED MCBRIDE BUIONECTOMY (Left) GASTROC RECESSION EXTREMITY (Left)  Patient Location: PACU  Anesthesia Type:GA combined with regional for post-op pain  Level of Consciousness: awake, alert , oriented and patient cooperative  Airway & Oxygen Therapy: Patient Spontanous Breathing and Patient connected to face mask oxygen  Post-op Assessment: Report given to RN and Post -op Vital signs reviewed and stable  Post vital signs: Reviewed and stable  Last Vitals:  Vitals:   07/10/16 0725 07/10/16 0730  BP:  (!) 160/75  Pulse: 68 68  Resp: 16 18  Temp:      Last Pain:  Vitals:   07/10/16 0641  TempSrc: Oral         Complications: No apparent anesthesia complications

## 2016-07-11 NOTE — Op Note (Signed)
NAME:  Debbie Hayes, Debbie Hayes NO.:  [id number]  MEDICAL RECORD NO.:  [id number]  LOCATION:                                 FACILITY:  PHYSICIAN:  Wylene Simmer, MD             DATE OF BIRTH:  DATE OF PROCEDURE:  07/10/2016 DATE OF DISCHARGE:                              OPERATIVE REPORT   PREOPERATIVE DIAGNOSES: 1. Painful left foot bunion deformity. 2. Tight left heel cord. 3. Left midfoot arthritis.  POSTOPERATIVE DIAGNOSES: 1. Painful left foot bunion deformity. 2. Tight left heel cord. 3. Left midfoot arthritis.  PROCEDURES: 1. Left gastrocnemius recession through a separate incision. 2. Left modified McBride bunionectomy through a separate incision. 3. Left Lapidus first tarsometatarsal joint arthrodesis. 4. Left second tarsometatarsal joint arthrodesis. 5. Left foot AP, lateral and oblique radiographs.  SURGEON:  Wylene Simmer, MD  ASSISTANT:  Mechele Claude, PA-C.  ANESTHESIA:  General, regional.  ESTIMATED BLOOD LOSS:  Minimal.  TOURNIQUET TIME:  88 minutes at 250 mmHg.  COMPLICATIONS:  None apparent.  DISPOSITION:  Extubated, awake, and stable to recovery.  INDICATIONS FOR PROCEDURE:  The patient is a 65 year old woman with past medical history significant for diabetes.  She has a painful left foot bunion deformity.  She also has dorsal midfoot pain consistent with arthritis of her second TMT joint.  She has failed nonoperative treatment to date.  She presents today for surgical correction of these painful conditions.  She understands the risks and benefits of the alternative treatment options and elects surgical treatment.  She specifically understands risks of bleeding, infection, nerve damage, blood clots, need for additional surgery, continued pain, nonunion, amputation, and death.  PROCEDURE IN DETAIL:  After preoperative consent was obtained and the correct operative site was identified, the patient was brought to the operating room and  placed supine on the operating table.  General anesthesia was induced.  Preoperative antibiotics were administered. Surgical time-out was taken.  Left lower extremity was prepped and draped in standard sterile fashion with tourniquet around the thigh. The extremity was exsanguinated, the tourniquet was inflated to 250 mmHg.  A longitudinal incision was made to the medial calf.  Sharp dissection was carried down through the skin and subcutaneous tissue. Superficial fascia was incised.  Gastrocnemius and plantaris tendons were divided under direct vision from medial-to-lateral taking care to protect the sural nerve posteriorly.  Wound was irrigated and closed with Monocryl and nylon.  Attention was then turned to the dorsum of the foot where a longitudinal incision was made at the first webspace.  Sharp dissection was carried down through the skin and subcutaneous tissue.  The intermetatarsal ligament was divided.  An arthrotomy was then made between the lateral sesamoid and metatarsal head.  Perforations were made in the lateral joint capsule.  The joint could then be positioned passively in 20 degrees of varus.  Attention was then turned to the medial eminence where a longitudinal incision was made.  Dissection was carried down through the skin and subcutaneous tissue and joint capsule.  The capsule was elevated plantarly and dorsally.  The hypertrophic medial eminence was then resected in line with the metatarsal shaft.  Cut surfaces of bone were smoothed.  Attention was then turned to the dorsum of the foot where longitudinal incision was made at the interspace between the first and second metatarsal bases.  Dissection was carried down through the skin and subcutaneous tissues.  The interval between the extensor hallucis longus and brevis tendons was then developed.  Neurovascular bundle was mobilized and retracted laterally throughout the case.  First and second TMT joints were  opened by incising the joint capsule.  An oscillating saw was then used to resect the articular cartilage and subchondral bone from both sides of the first tarsometatarsal joint Bevelling the cuts laterally to take more bone and correct the intermetatarsal angle.  The second TMT joint was then cleaned of all residual cartilage and subchondral bone with curettes and rongeurs.  The second TMT joint was reduced and provisionally pinned.  The first TMT joint was then reduced correcting the intermetatarsal angle.  It was provisionally pinned.  AP and lateral radiographs confirmed appropriate position of the guidepins. Both guidepins were then overdrilled and utilized to place 4-0 cannulated partially-threaded Biomet screws.  These compressed the first and second TMT joints appropriately.  The first TMT joint was then further fixed with a 3.5-m LPS screw.  The first and second interspace were compressed with a tenaculum.  Guidepin was then position for the home-run screw.  Guidepin was overdrilled after radiographs confirmed appropriate positioning.  A 3.5-mm fully-threaded LPS screw was inserted.  Another LPS screw was placed from distal-to-proximal across the second TMT joint.  Final AP, lateral and oblique radiographs showed appropriate position and length of all hardware and appropriate reduction of the first and second TMT joints and the interspace between the second metatarsal base and medial cuneiform.  The wounds were irrigated copiously.  Medial joint capsule was repaired with imbricating sutures of 2-0 Vicryl.  The remaining incisions were closed with Monocryl and nylon.  Sterile dressings were applied followed by a well- padded short-leg splint.  Tourniquet was released after application of the dressings at 88 minutes.  The patient was awakened from anesthesia and transported to the recovery room in stable condition.  FOLLOWUP PLAN:  The patient will be nonweightbearing on the left  lower extremity.  She will take aspirin for DVT prophylaxis.  She will follow up with me in the office in 2 weeks for suture removal and conversion to a short-leg cast.  Mechele Claude, PA-C, was present and scrubbed for the duration of the case.  His assistance was essential in positioning the patient, prepping and draping, gaining and maintaining exposure, performing the operation, closing and dressing the wounds and applying the splint.  RADIOGRAPHS:  AP, oblique and lateral radiographs of the left foot were obtained intraoperatively.  These show interval arthrodesis of the first and second TMT joints.  Hardwares were appropriately positioned and of the appropriate lengths.  No other acute injuries were noted.     Wylene Simmer, MD     JH/MEDQ  D:  07/10/2016  T:  07/11/2016  Job:  [id number]

## 2016-07-15 ENCOUNTER — Encounter (HOSPITAL_BASED_OUTPATIENT_CLINIC_OR_DEPARTMENT_OTHER): Payer: Self-pay | Admitting: Orthopedic Surgery

## 2016-07-15 DIAGNOSIS — M6702 Short Achilles tendon (acquired), left ankle: Secondary | ICD-10-CM | POA: Diagnosis not present

## 2016-07-15 DIAGNOSIS — M21612 Bunion of left foot: Secondary | ICD-10-CM | POA: Diagnosis not present

## 2016-07-15 DIAGNOSIS — M19072 Primary osteoarthritis, left ankle and foot: Secondary | ICD-10-CM | POA: Diagnosis not present

## 2016-07-24 ENCOUNTER — Ambulatory Visit: Payer: 59 | Admitting: Family Medicine

## 2016-07-24 MED FILL — NITROFURANTOIN MCR 50 MG CA: 50 | 30 days supply | Qty: 30 | Fill #4

## 2016-07-24 MED FILL — METOPROLOL SUCC ER 25 MG TA: 25 | 90 days supply | Qty: 90 | Fill #2

## 2016-07-24 MED FILL — ZOLPIDEM TARTRATE 5 MG TAB: 5 | 60 days supply | Qty: 30 | Fill #1

## 2016-07-25 DIAGNOSIS — M19072 Primary osteoarthritis, left ankle and foot: Secondary | ICD-10-CM | POA: Diagnosis not present

## 2016-07-30 ENCOUNTER — Telehealth: Payer: Self-pay | Admitting: Behavioral Health

## 2016-07-30 ENCOUNTER — Encounter: Payer: Self-pay | Admitting: Behavioral Health

## 2016-07-30 MED FILL — HYDROCODON-APAP 5-325: 5-325 | 7 days supply | Qty: 20 | Fill #0

## 2016-07-30 NOTE — Telephone Encounter (Signed)
Pre-Visit Call completed with patient and chart updated.   Pre-Visit Info documented in Specialty Comments under [employer].    

## 2016-07-31 ENCOUNTER — Encounter: Payer: Self-pay | Admitting: Family Medicine

## 2016-07-31 ENCOUNTER — Ambulatory Visit (INDEPENDENT_AMBULATORY_CARE_PROVIDER_SITE_OTHER): Payer: 59 | Admitting: Family Medicine

## 2016-07-31 VITALS — BP 110/70 | HR 77 | Temp 98.6°F | Ht 68.0 in | Wt 177.0 lb

## 2016-07-31 DIAGNOSIS — I10 Essential (primary) hypertension: Secondary | ICD-10-CM

## 2016-07-31 DIAGNOSIS — E119 Type 2 diabetes mellitus without complications: Secondary | ICD-10-CM

## 2016-07-31 DIAGNOSIS — E782 Mixed hyperlipidemia: Secondary | ICD-10-CM | POA: Diagnosis not present

## 2016-07-31 MED ORDER — PRAVASTATIN SODIUM 10 MG PO TABS: 10.0000 mg | ORAL_TABLET | Freq: Every day | ORAL | 3 refills | Status: DC

## 2016-07-31 MED FILL — PRAVASTATIN NA 10 MG TAB: 10 | 90 days supply | Qty: 90 | Fill #0

## 2016-07-31 NOTE — Patient Instructions (Signed)
It was a pleasure to see you today- take care and I hope that your foot heals easily We will try Pravachol 10 mg for your cholesterol issue.  If you have body pains and need to stop the medication that is ok- keep me posted We will get your A1c level today and can plan to do a cholesterol panel in about 3 months

## 2016-07-31 NOTE — Progress Notes (Signed)
Pre visit review using our clinic review tool, if applicable. No additional management support is needed unless otherwise documented below in the visit note. 

## 2016-07-31 NOTE — Progress Notes (Signed)
[place] at [facility] [address] [place], [place] [address] [phone]  Date:  07/31/2016   Name:  Debbie Hayes   DOB:  08-01-1950   MRN:  [id number]  PCP:  Debbie Reichmann, MD    Chief Complaint: Establish Care (Pt here to est care. Former pt of Dr. Everlene Farrier. )   History of Present Illness:  Debbie Hayes is a 66 y.o. very pleasant female patient who presents with the following:  History of HTN, DM (controlled), hyperlipidemia She had some surgery on her left foot in December per Dr. Doran Durand- she is still in a cast and NWD.  She is now on chronic macrobid and has not had any UTI since starting it  Lab Results  Component Value Date   HGBA1C 7.0 03/05/2016   She went up on her BP meds last year as her readings were trending up She started [place] in November and her glucose looks good since starting this- we will check her A1c today  She sees Dr. Chalmers Cater for her thyroid- she has a history of thyroid cancer dx 8 years ago.   She does not use asmanex- she stopped this. She does take singulair for chronic allergies.  No recent wheezing or SOB  Long standing history of hyperlipidemia- she has tried a few different statins but would have pain so she stopped taking them. She has tried crestor most recently and this caused body aches. She would like to try another statin to see if she might be able to tolerate a low dose.  Does not think that she has tried pravachol  Lab Results  Component Value Date   CHOL 261 (H) 05/01/2015   CHOL 225 (H) 02/17/2014   CHOL 240 (H) 10/14/2013   Lab Results  Component Value Date   HDL 61 05/01/2015   HDL 57 02/17/2014   HDL 48 10/14/2013   Lab Results  Component Value Date   LDLCALC 172 (H) 05/01/2015   LDLCALC 149 (H) 02/17/2014   LDLCALC 158 (H) 10/14/2013   Lab Results  Component Value Date   TRIG 142 05/01/2015   TRIG 97 02/17/2014   TRIG 170 (H) 10/14/2013   Lab Results   Component Value Date   CHOLHDL 4.3 05/01/2015   CHOLHDL 3.9 02/17/2014   CHOLHDL 5.0 10/14/2013   No results found for: LDLDIRECT   Patient Active Problem List   Diagnosis Date Noted  . Snoring 03/25/2016  . Palpitations 03/25/2016  . Sleep related headaches 03/25/2016  . Trigeminal anesthesia 11/26/2015  . Spondylolisthesis of lumbar region 08/09/2014  . Back pain 02/17/2014  . Macular degeneration 02/17/2014  . Other and unspecified hyperlipidemia 10/14/2013  . Onychomycosis 12/01/2012  . Diabetes mellitus ([place]) 04/12/2012  . Arthritis 04/12/2012  . Hypothyroid 04/12/2012    Past Medical History:  Diagnosis Date  . Allergy   . Anxiety   . Cataract   . Chronic migraine BOTOX INJECTION EVERY 3 MONTHS  . Chronic pain in right shoulder   . Constipation   . Diabetes mellitus   . Disorder of inner ear CAUSES VERTIGO OCCASIONALLY  . Fibromyalgia   . GERD (gastroesophageal reflux disease)   . Hemorrhoid   . History of bladder infections   . History of thyroid cancer 2009  S/P TOTAL THYROIDECTOMY AND RADIATION  . Hyperlipidemia   . Hypertension CARDIOLOGIST- DR Wynonia Lawman- WILL REQUEST LATE NOTE   DENIES S & S  .  Hypothyroidism   . Insomnia   . Left shoulder pain   . Macular degeneration   . Normal nuclear stress test 01-25-2009  . OA (osteoarthritis) JOINTS  . PONV (postoperative nausea and vomiting) SEVERE  . Rotator cuff disorder LEFT SHOULDER RTC IMPINGMENT  . Spondylolisthesis of lumbar region   . 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 Wears glasses     Past Surgical History:  Procedure Laterality Date  . BILATERAL CARPAL TUNNEL RELEASE  1994  . BILATERAL ELBOW SURG.  1999  . BILATERAL SALPINGOOPHECTOMY  1993   POST-OP URETER REPAIR 12 DAYS AFTER   . FOOT ARTHRODESIS Left 07/10/2016   Procedure: LEFT 2ND TARSAL METATARSAL ARTHRODESIS  GASTROC RECESSION LEFT LAPIDUS MODIFIED MCBRIDE BUIONECTOMY;  Surgeon: Wylene Simmer, MD;  Location: [place];  Service: Orthopedics;  Laterality: Left;  Marland Kitchen GASTROC RECESSION EXTREMITY Left 07/10/2016   Procedure: GASTROC RECESSION EXTREMITY;  Surgeon: Wylene Simmer, MD;  Location: [place];  Service: Orthopedics;  Laterality: Left;  . LEFT THUMB JOINT REPLACEMENT  2005   RIGHT DONE IN 2004  . RIGHT KNEE ARTHROSCOPY  X2  BEFORE 2011  . RIGHT KNEE ARTHROSCOPY/ PARTIAL LATERAL MENISECTOMY/ TRICOMPARTMENT CHONDROPLASTY/ DECOMPRESSION CYST  10-26-2009  . RIGHT KNEE CLOSED MANIPULATION  09-11-2010  . RIGHT SHOULDER ARTHROSCOPY  2010  &  2004  . TIBIA CYST REMOVED AND ORIF LEG FX  1958  . TONSILLECTOMY  1968  . TOTAL KNEE ARTHROPLASTY  07-16-2010   RIGHT  . TOTAL THYROIDECTOMY  2009   CANCER  (POST-OP BLEED)  AND RADIATION [place]  . trigger fingers Right 2013   3rd and 4th fingers  . VAGINAL HYSTERECTOMY  1990    Social History  Substance Use Topics  . Smoking status: Never Smoker  . Smokeless tobacco: Never Used  . Alcohol use Yes     Comment: RARE    Family History  Problem Relation Age of Onset  . Colon polyps Sister 80  . Alzheimer's disease Mother   . Heart disease Father   . Cirrhosis Father   . Heart attack Father   . Macular degeneration Father   . Colon cancer Neg Hx     Allergies  Allergen Reactions  . Ciprofloxacin Diarrhea  . Other Other (See Comments)    Artificial sweetners  . Sulfa Antibiotics Hives    Medication list has been reviewed and updated.  Current Outpatient Prescriptions on File Prior to Visit  Medication Sig Dispense Refill  . acetaminophen (TYLENOL) 500 MG tablet Take 500 mg by mouth every 6 (six) hours as needed.    Marland Kitchen aspirin EC 81 MG tablet Take 1 tablet (81 mg total) by mouth 2 (two) times daily. 84 tablet 0  . azelastine (ASTELIN) 0.1 % nasal spray PLACE 2 SPRAYS INTO BOTH NOSTRILS 2 TIMES DAILY AS DIRECTED 90 mL 0  . blood glucose meter kit and supplies KIT Test blood sugar once daily. Dx code: E11.9 1 each 0  . Calcium  Citrate (CITRACAL PO) Take by mouth daily.    . cetirizine (ZYRTEC) 10 MG tablet Take 10 mg by mouth daily as needed for allergies.     Marland Kitchen docusate sodium (COLACE) 100 MG capsule Take 1 capsule (100 mg total) by mouth 2 (two) times daily. While taking narcotic pain medicine. 30 capsule 0  . eletriptan (RELPAX) 20 MG tablet TAKE 1 TABLET BY MOUTH AS NEEDED FOR MIGRAINE OR HEADACHE. MAY REPEAT IN 2 HOURS IF HEADACHE PERSISTS  OR RECURS. 9 tablet 1  . levothyroxine (SYNTHROID, LEVOTHROID) 137 MCG tablet Take 137 mcg by mouth daily before breakfast.    . lidocaine (LIDODERM) 5 % Place 1 patch onto the skin as needed (for pain). Remove & Discard patch within 12 hours or as directed by MD    . losartan (COZAAR) 50 MG tablet Take 1 tablet daily (Patient taking differently: 75 mg. Take 1 tablet daily) 90 tablet 3  . metaxalone (SKELAXIN) 800 MG tablet TAKE 1 TABLET BY MOUTH 3 TIMES DAILY. 90 tablet 2  . metFORMIN (GLUCOPHAGE) 1000 MG tablet TAKE 1 TABLET BY MOUTH 2 TIMES DAILY WITH A MEAL. 180 tablet 0  . metoprolol succinate (TOPROL-XL) 25 MG 24 hr tablet Take 1 tablet (25 mg total) by mouth every morning. 90 tablet 3  . mometasone (ASMANEX) 220 MCG/INH inhaler Inhale 2 puffs into the lungs daily. 3 Inhaler 3  . montelukast (SINGULAIR) 10 MG tablet TAKE 1 TABLET BY MOUTH AT BEDTIME 90 tablet 0  . Multiple Vitamins-Minerals (ICAPS AREDS 2) CAPS Take by mouth.    Marland Kitchen NASONEX 50 MCG/ACT nasal spray PLACE 2 SPRAYS INTO THE NOSE DAILY. 17 g 9  . nitrofurantoin (MACRODANTIN) 50 MG capsule Take 50 mg by mouth at bedtime.     . NON FORMULARY     . oxyCODONE (ROXICODONE) 5 MG immediate release tablet Take 1-2 tablets (5-10 mg total) by mouth every 4 (four) hours as needed for moderate pain or severe pain. 30 tablet 0  . polyethylene glycol (MIRALAX / GLYCOLAX) packet Take 17 g by mouth daily.    . sitaGLIPtin (JANUVIA) 100 MG tablet Take 1 tablet (100 mg total) by mouth daily. 90 tablet 3  . traZODone (DESYREL) 100  MG tablet TAKE 1-1/2-2 TABLETS BY MOUTH AT BEDTIME (Patient taking differently: 200 mg. TAKE 1-1/2-2 TABLETS BY MOUTH AT BEDTIME) 180 tablet 3  . TURMERIC PO Take 1 tablet by mouth 2 (two) times daily.    Marland Kitchen zolpidem (AMBIEN) 5 MG tablet Take one half tablet at night for sleep 30 tablet 1   No current facility-administered medications on file prior to visit.     Review of Systems:  As per HPI- otherwise negative.  No fever, chills, SOB, CP, rash   Physical Examination: Blood pressure 110/70, pulse 77, temperature 98.6 F (37 C), temperature source Oral, height _0  (1.727 m), weight 177 lb (80.3 kg), SpO2 97 %. Ideal Body Weight:    GEN: WDWN, NAD, Non-toxic, A & O x 3,looks well, left foot in a cast and is using a knee scooter for mobility  HEENT: Atraumatic, Normocephalic. Neck supple. No masses, No LAD. Ears and Nose: No external deformity. CV: RRR, No M/G/R. No JVD. No thrill. No extra heart sounds. PULM: CTA B, no wheezes, crackles, rhonchi. No retractions. No resp. distress. No accessory muscle use. EXTR: No c/c/e PSYCH: Normally interactive. Conversant. Not depressed or anxious appearing.  Calm demeanor.    Assessment and Plan: Diabetes mellitus without complication ([place]) - Plan: Hemoglobin A1c  Essential hypertension  Mixed hyperlipidemia - Plan: pravastatin (PRAVACHOL) 10 MG tablet  Here today to establish care as her PCP Dr. Everlene Farrier is retiring Will check A1c as this is due Her BP is under control- continue current medications Her chronic UTI is controlled with macrobid She would like to try a statin again as she is concerned about her lipids rx 10 mg of pravachol- she will try 5 mg QOD and advance as tolerated Plan to  recheck here in 3-4 months   Signed Lamar Blinks, MD

## 2016-08-01 LAB — HEMOGLOBIN A1C: HEMOGLOBIN A1C: 6.5 % (ref 4.6–6.5)

## 2016-08-15 ENCOUNTER — Encounter: Payer: Self-pay | Admitting: Family Medicine

## 2016-08-16 MED ORDER — TRAZODONE HCL 100 MG PO TABS: ORAL_TABLET | ORAL | 3 refills | Status: DC

## 2016-08-18 ENCOUNTER — Encounter: Payer: Self-pay | Admitting: Family Medicine

## 2016-08-18 DIAGNOSIS — Z803 Family history of malignant neoplasm of breast: Secondary | ICD-10-CM | POA: Diagnosis not present

## 2016-08-18 DIAGNOSIS — Z1231 Encounter for screening mammogram for malignant neoplasm of breast: Secondary | ICD-10-CM | POA: Diagnosis not present

## 2016-08-27 ENCOUNTER — Other Ambulatory Visit: Payer: Self-pay | Admitting: Emergency Medicine

## 2016-08-27 ENCOUNTER — Other Ambulatory Visit: Payer: Self-pay | Admitting: Physician Assistant

## 2016-08-27 DIAGNOSIS — E088 Diabetes mellitus due to underlying condition with unspecified complications: Secondary | ICD-10-CM

## 2016-08-27 MED FILL — NITROFURANTOIN MCR 50 MG CA: 50 | 30 days supply | Qty: 30 | Fill #0

## 2016-08-29 ENCOUNTER — Other Ambulatory Visit: Payer: Self-pay | Admitting: Emergency Medicine

## 2016-08-29 DIAGNOSIS — E088 Diabetes mellitus due to underlying condition with unspecified complications: Secondary | ICD-10-CM

## 2016-08-29 MED ORDER — METFORMIN HCL 1000 MG PO TABS: ORAL_TABLET | ORAL | 0 refills | Status: DC

## 2016-08-29 MED FILL — metFORMIN HCL 1000 MG TABS: 1000 | 90 days supply | Qty: 180 | Fill #0

## 2016-08-29 NOTE — Telephone Encounter (Signed)
Received refill request for METFORM HCL 1,000 MG TAB. Last office visit 07/31/16 and last refill 04/23/16. Refill sent to [facility] as requested.

## 2016-09-01 MED FILL — ELETRIPTAN HBR 20 MG TABLET: 20 | 30 days supply | Qty: 9 | Fill #1

## 2016-09-03 MED FILL — traZODone HCL 100 MG TABS: 100 | 90 days supply | Qty: 225 | Fill #0

## 2016-09-04 ENCOUNTER — Other Ambulatory Visit: Payer: Self-pay | Admitting: Emergency Medicine

## 2016-09-04 MED ORDER — MONTELUKAST SODIUM 10 MG PO TABS
10.0000 mg | ORAL_TABLET | Freq: Every day | ORAL | 0 refills | Status: DC
Start: 2016-09-04 — End: 2016-12-12

## 2016-09-05 ENCOUNTER — Other Ambulatory Visit: Payer: Self-pay | Admitting: Emergency Medicine

## 2016-09-05 MED ORDER — LEVOTHYROXINE SODIUM 137 MCG PO TABS: 137.0000 ug | ORAL_TABLET | Freq: Every day | ORAL | 1 refills | Status: DC

## 2016-09-05 MED FILL — MONTELUKAST SOD 10 MG TAB: 10 | 90 days supply | Qty: 90 | Fill #0

## 2016-09-08 ENCOUNTER — Other Ambulatory Visit: Payer: Self-pay | Admitting: Emergency Medicine

## 2016-09-09 DIAGNOSIS — N3020 Other chronic cystitis without hematuria: Secondary | ICD-10-CM | POA: Diagnosis not present

## 2016-09-09 MED FILL — JANUVIA 100 MG TABLET: 100 | 30 days supply | Qty: 30 | Fill #1

## 2016-09-19 DIAGNOSIS — M21612 Bunion of left foot: Secondary | ICD-10-CM | POA: Diagnosis not present

## 2016-09-19 DIAGNOSIS — M19072 Primary osteoarthritis, left ankle and foot: Secondary | ICD-10-CM | POA: Diagnosis not present

## 2016-09-22 ENCOUNTER — Ambulatory Visit (INDEPENDENT_AMBULATORY_CARE_PROVIDER_SITE_OTHER): Payer: 59 | Admitting: Family Medicine

## 2016-09-22 VITALS — BP 122/82 | HR 69 | Temp 97.9°F | Ht 68.0 in | Wt 181.2 lb

## 2016-09-22 DIAGNOSIS — D649 Anemia, unspecified: Secondary | ICD-10-CM | POA: Diagnosis not present

## 2016-09-22 DIAGNOSIS — I10 Essential (primary) hypertension: Secondary | ICD-10-CM | POA: Diagnosis not present

## 2016-09-22 LAB — CBC
HCT: 39.8 % (ref 36.0–46.0)
HEMOGLOBIN: 12.9 g/dL (ref 12.0–15.0)
MCHC: 32.4 g/dL (ref 30.0–36.0)
MCV: 86 fl (ref 78.0–100.0)
Platelets: 214 10*3/uL (ref 150.0–400.0)
RBC: 4.62 Mil/uL (ref 3.87–5.11)
RDW: 14.8 % (ref 11.5–15.5)
WBC: 6.1 10*3/uL (ref 4.0–10.5)

## 2016-09-22 MED ORDER — LOSARTAN POTASSIUM 50 MG PO TABS: 100.0000 mg | ORAL_TABLET | Freq: Every day | ORAL | 3 refills | Status: DC

## 2016-09-22 NOTE — Progress Notes (Signed)
Pre visit review using our clinic review tool, if applicable. No additional management support is needed unless otherwise documented below in the visit note. 

## 2016-09-22 NOTE — Patient Instructions (Signed)
It was wonderful to see you again today- please say hello to Shanon Brow for me You are fine to take 2 of the losartan to equal 100 mg; if you need to adjust this down a bit you certainly may do so We will check your blood level today

## 2016-09-22 NOTE — Progress Notes (Signed)
[place] at [facility] [address] [place],  [address] [phone]  Date:  09/22/2016   Name:  Debbie Hayes   DOB:  11/28/1950   MRN:  [id number]  PCP:  Lamar Blinks, MD    Chief Complaint: Medication Management (Pt has increased two of her meds and would like to discuss if it's ok with provider. )   History of Present Illness:  Debbie Hayes is a 66 y.o. very pleasant female patient who presents with the following:  Here today to follow-up on a couple of medication questions . She has increased her losartan to 100 mg a day as her BP has been running a bit high at home.  Last BP @ home 125/70.  Denies any hypotension.  She is taking 250Trazadone at bedtime for sleep since her recent foot operation but plans to decrease her dose soon.  Would like prescripttions ot match how she is currently taking, so that she does not run out.  Blood sugars 100-120.  Has lost 30 lbs, kept off 10 years.  She is recoving well from her recent foot surgery  Her mother and GM have a history of stroke   She is tolerating the low dose of pravachol without any symptoms such as body aches   Her recent foot operation is healing well, she is out of her boot some of the time and is walking around ok  She is still feeling pretty tired from her recent operation and would like to ensure that she is not anemic. Will check a CBC for her today also    Patient Active Problem List   Diagnosis Date Noted  . Snoring 03/25/2016  . Palpitations 03/25/2016  . Sleep related headaches 03/25/2016  . Trigeminal anesthesia 11/26/2015  . Spondylolisthesis of lumbar region 08/09/2014  . Back pain 02/17/2014  . Macular degeneration 02/17/2014  . Other and unspecified hyperlipidemia 10/14/2013  . Onychomycosis 12/01/2012  . Diabetes mellitus ([place]) 04/12/2012  . Arthritis 04/12/2012  . Hypothyroid 04/12/2012    Past Medical History:  Diagnosis  Date  . Allergy   . Anxiety   . Cataract   . Chronic migraine BOTOX INJECTION EVERY 3 MONTHS  . Chronic pain in right shoulder   . Constipation   . Diabetes mellitus   . Disorder of inner ear CAUSES VERTIGO OCCASIONALLY  . Fibromyalgia   . GERD (gastroesophageal reflux disease)   . Hemorrhoid   . History of bladder infections   . History of thyroid cancer 2009  S/P TOTAL THYROIDECTOMY AND RADIATION  . Hyperlipidemia   . Hypertension CARDIOLOGIST- DR Wynonia Lawman- WILL REQUEST LATE NOTE   DENIES S & S  . Hypothyroidism   . Insomnia   . Left shoulder pain   . Macular degeneration   . Normal nuclear stress test 01-25-2009  . OA (osteoarthritis) JOINTS  . PONV (postoperative nausea and vomiting) SEVERE  . Rotator cuff disorder LEFT SHOULDER RTC IMPINGMENT  . Spondylolisthesis of lumbar region   . 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 Wears glasses     Past Surgical History:  Procedure Laterality Date  . BILATERAL CARPAL TUNNEL RELEASE  1994  . BILATERAL ELBOW SURG.  1999  . BILATERAL SALPINGOOPHECTOMY  1993   POST-OP URETER REPAIR 12 DAYS AFTER   . FOOT ARTHRODESIS Left 07/10/2016   Procedure: LEFT 2ND TARSAL METATARSAL ARTHRODESIS  GASTROC RECESSION LEFT LAPIDUS MODIFIED MCBRIDE BUIONECTOMY;  Surgeon: Wylene Simmer,  MD;  Location: [place];  Service: Orthopedics;  Laterality: Left;  Marland Kitchen GASTROC RECESSION EXTREMITY Left 07/10/2016   Procedure: GASTROC RECESSION EXTREMITY;  Surgeon: Wylene Simmer, MD;  Location: [place];  Service: Orthopedics;  Laterality: Left;  . LEFT THUMB JOINT REPLACEMENT  2005   RIGHT DONE IN 2004  . RIGHT KNEE ARTHROSCOPY  X2  BEFORE 2011  . RIGHT KNEE ARTHROSCOPY/ PARTIAL LATERAL MENISECTOMY/ TRICOMPARTMENT CHONDROPLASTY/ DECOMPRESSION CYST  10-26-2009  . RIGHT KNEE CLOSED MANIPULATION  09-11-2010  . RIGHT SHOULDER ARTHROSCOPY  2010  &  2004  . TIBIA CYST REMOVED AND ORIF LEG FX  1958  . TONSILLECTOMY  1968  . TOTAL KNEE  ARTHROPLASTY  07-16-2010   RIGHT  . TOTAL THYROIDECTOMY  2009   CANCER  (POST-OP BLEED)  AND RADIATION [place]  . trigger fingers Right 2013   3rd and 4th fingers  . VAGINAL HYSTERECTOMY  1990    Social History  Substance Use Topics  . Smoking status: Never Smoker  . Smokeless tobacco: Never Used  . Alcohol use Yes     Comment: RARE    Family History  Problem Relation Age of Onset  . Colon polyps Sister 84  . Alzheimer's disease Mother   . Heart disease Father   . Cirrhosis Father   . Heart attack Father   . Macular degeneration Father   . Colon cancer Neg Hx     Allergies  Allergen Reactions  . Ciprofloxacin Diarrhea  . Other Other (See Comments)    Artificial sweetners  . Sulfa Antibiotics Hives    Medication list has been reviewed and updated.  Current Outpatient Prescriptions on File Prior to Visit  Medication Sig Dispense Refill  . acetaminophen (TYLENOL) 500 MG tablet Take 500 mg by mouth every 6 (six) hours as needed.    Marland Kitchen aspirin EC 81 MG tablet Take 1 tablet (81 mg total) by mouth 2 (two) times daily. 84 tablet 0  . azelastine (ASTELIN) 0.1 % nasal spray PLACE 2 SPRAYS INTO BOTH NOSTRILS 2 TIMES DAILY AS DIRECTED 90 mL 0  . blood glucose meter kit and supplies KIT Test blood sugar once daily. Dx code: E11.9 1 each 0  . cetirizine (ZYRTEC) 10 MG tablet Take 10 mg by mouth daily as needed for allergies.     Marland Kitchen eletriptan (RELPAX) 20 MG tablet TAKE 1 TABLET BY MOUTH AS NEEDED FOR MIGRAINE OR HEADACHE. MAY REPEAT IN 2 HOURS IF HEADACHE PERSISTS OR RECURS. 9 tablet 1  . levothyroxine (SYNTHROID, LEVOTHROID) 137 MCG tablet Take 1 tablet (137 mcg total) by mouth daily before breakfast. 90 tablet 1  . lidocaine (LIDODERM) 5 % Place 1 patch onto the skin as needed (for pain). Remove & Discard patch within 12 hours or as directed by MD    . metaxalone (SKELAXIN) 800 MG tablet TAKE 1 TABLET BY MOUTH 3 TIMES DAILY. 90 tablet 2  . metFORMIN (GLUCOPHAGE) 1000 MG tablet TAKE 1  TABLET BY MOUTH 2 TIMES DAILY WITH A MEAL. 180 tablet 0  . metoprolol succinate (TOPROL-XL) 25 MG 24 hr tablet Take 1 tablet (25 mg total) by mouth every morning. 90 tablet 3  . montelukast (SINGULAIR) 10 MG tablet Take 1 tablet (10 mg total) by mouth at bedtime. 90 tablet 0  . Multiple Vitamins-Minerals (ICAPS AREDS 2) CAPS Take by mouth.    Marland Kitchen NASONEX 50 MCG/ACT nasal spray PLACE 2 SPRAYS INTO THE NOSE DAILY. 17 g 9  . nitrofurantoin (  MACRODANTIN) 50 MG capsule Take 50 mg by mouth at bedtime.     . NON FORMULARY     . polyethylene glycol (MIRALAX / GLYCOLAX) packet Take 17 g by mouth daily.    . pravastatin (PRAVACHOL) 10 MG tablet Take 1 tablet (10 mg total) by mouth daily. 90 tablet 3  . sitaGLIPtin (JANUVIA) 100 MG tablet Take 1 tablet (100 mg total) by mouth daily. 90 tablet 3  . traZODone (DESYREL) 100 MG tablet TAKE 2 or 2.5 TABLETS BY MOUTH AT BEDTIME (Patient taking differently: TAKE  2.5 TABLETS ( Total 250 MG) BY MOUTH AT BEDTIME) 225 tablet 3  . zolpidem (AMBIEN) 5 MG tablet Take one half tablet at night for sleep 30 tablet 1   No current facility-administered medications on file prior to visit.     Review of Systems:  Review of Systems  Constitutional: Negative for chills, fever, malaise/fatigue and weight loss.  Respiratory: Negative for cough and wheezing.   Cardiovascular: Negative for chest pain and palpitations.  Neurological: Negative for weakness.  All other systems reviewed and are negative.    Physical Examination: Vitals:   09/22/16 1243  BP: 122/82  Pulse: 69  Temp: 97.9 F (36.6 C)   Vitals:   09/22/16 1243  Weight: 181 lb 3.2 oz (82.2 kg)  Height: '5\' 8"'$  (1.727 m)   Body mass index is 27.55 kg/m. Ideal Body Weight: Weight in (lb) to have BMI = 25: 164.1  Physical Examination: General appearance - alert, well appearing, and in no distress and oriented to person, place, and time Mental status - alert, oriented to person, place, and time, normal  mood, behavior, speech, dress, motor activity, and thought processes Eyes - pupils equal and reactive, extraocular eye movements intact Ears - bilateral TM's and external ear canals normal Chest - clear to auscultation, no wheezes, rales or rhonchi, symmetric air entry Heart - normal rate, regular rhythm, normal S1, S2, no murmurs, rubs, clicks or gallops Abdomen - soft, nontender, nondistended, no masses or organomegaly Skin - normal coloration and turgor, no rashes, no suspicious skin lesions noted   Assessment and Plan:  Essential hypertension - Plan: losartan (COZAAR) 50 MG tablet  Anemia, unspecified type - Plan: CBC   Patient instructions:  It was wonderful to see you again today- please say hello to Shanon Brow for me You are fine to take 2 of the losartan to equal 100 mg; if you need to adjust this down a bit you certainly may do so We will check your blood level today to ensure that you are not anemic  Results for orders placed or performed in visit on 09/22/16  CBC  Result Value Ref Range   WBC 6.1 4.0 - 10.5 K/uL   RBC 4.62 3.87 - 5.11 Mil/uL   Platelets 214.0 150.0 - 400.0 K/uL   Hemoglobin 12.9 12.0 - 15.0 g/dL   HCT 39.8 36.0 - 46.0 %   MCV 86.0 78.0 - 100.0 fl   MCHC 32.4 30.0 - 36.0 g/dL   RDW 14.8 11.5 - 15.5 %   She plans to see me in a few months to repeat her labs/ cholesterol   Signed Lamar Blinks, MD

## 2016-10-01 ENCOUNTER — Other Ambulatory Visit: Payer: Self-pay | Admitting: Emergency Medicine

## 2016-10-01 ENCOUNTER — Encounter: Payer: Self-pay | Admitting: Family Medicine

## 2016-10-01 DIAGNOSIS — G43001 Migraine without aura, not intractable, with status migrainosus: Secondary | ICD-10-CM

## 2016-10-01 MED FILL — NITROFURANTOIN MCR 50 MG CA: 50 | 30 days supply | Qty: 30 | Fill #1

## 2016-10-01 MED FILL — LEVOTHYROXINE 137 MCG TABLE: 137 | 90 days supply | Qty: 90 | Fill #3

## 2016-10-01 MED FILL — LOSARTAN POTASSIUM 50 MG TA: 50 | 90 days supply | Qty: 90 | Fill #1

## 2016-10-01 MED FILL — ELETRIPTAN HBR 20 MG TABLET: 20 | 30 days supply | Qty: 9 | Fill #0

## 2016-10-01 NOTE — Telephone Encounter (Signed)
Reviewed [place]- she got 30 ambien 5 in late October and in late December. Will refill her relpax but would like to send her a message about her Lorrin Mais as she is also taking a large amt of trazodone at night right now

## 2016-10-02 ENCOUNTER — Other Ambulatory Visit: Payer: Self-pay | Admitting: Emergency Medicine

## 2016-10-02 DIAGNOSIS — G43001 Migraine without aura, not intractable, with status migrainosus: Secondary | ICD-10-CM

## 2016-10-02 NOTE — Telephone Encounter (Signed)
Received refill request on zolpidem (AMBIEN) 5 MG tablet. Last office visit 09/22/16 and last refill 03/17/16. Is it ok to refill? Please advise.

## 2016-10-05 MED ORDER — ZOLPIDEM TARTRATE 5 MG PO TABS: ORAL_TABLET | ORAL | 1 refills | Status: DC

## 2016-10-05 MED ORDER — ELETRIPTAN HYDROBROMIDE 20 MG PO TABS: ORAL_TABLET | ORAL | 4 refills | Status: DC

## 2016-10-08 MED FILL — ZOLPIDEM TARTRATE 5 MG TAB: 5 | 60 days supply | Qty: 30 | Fill #0

## 2016-10-08 MED FILL — JANUVIA 100 MG TABLET: 100 | 30 days supply | Qty: 30 | Fill #2

## 2016-10-16 DIAGNOSIS — G4733 Obstructive sleep apnea (adult) (pediatric): Secondary | ICD-10-CM | POA: Diagnosis not present

## 2016-10-22 DIAGNOSIS — M21612 Bunion of left foot: Secondary | ICD-10-CM | POA: Diagnosis not present

## 2016-10-22 DIAGNOSIS — M19072 Primary osteoarthritis, left ankle and foot: Secondary | ICD-10-CM | POA: Diagnosis not present

## 2016-10-22 DIAGNOSIS — Z4789 Encounter for other orthopedic aftercare: Secondary | ICD-10-CM | POA: Diagnosis not present

## 2016-10-29 MED FILL — NITROFURANTOIN MCR 50 MG CA: 50 | 30 days supply | Qty: 30 | Fill #2

## 2016-11-07 ENCOUNTER — Ambulatory Visit (INDEPENDENT_AMBULATORY_CARE_PROVIDER_SITE_OTHER): Payer: 59 | Admitting: Family Medicine

## 2016-11-07 ENCOUNTER — Encounter: Payer: Self-pay | Admitting: Family Medicine

## 2016-11-07 VITALS — BP 98/70 | HR 87 | Temp 98.2°F | Ht 68.0 in | Wt 179.0 lb

## 2016-11-07 DIAGNOSIS — N3001 Acute cystitis with hematuria: Secondary | ICD-10-CM | POA: Diagnosis not present

## 2016-11-07 LAB — POC URINALSYSI DIPSTICK (AUTOMATED)
Bilirubin, UA: NEGATIVE
Glucose, UA: NEGATIVE
Ketones, UA: NEGATIVE
PH UA: 6 (ref 5.0–8.0)
PROTEIN UA: NEGATIVE
UROBILINOGEN UA: 0.2 U/dL

## 2016-11-07 MED ORDER — CEPHALEXIN 500 MG PO CAPS: 500.0000 mg | ORAL_CAPSULE | Freq: Three times a day (TID) | ORAL | 0 refills | Status: DC

## 2016-11-07 MED FILL — CEPHALEXIN 500 MG CAPSULE: 500 | 10 days supply | Qty: 30 | Fill #0

## 2016-11-07 NOTE — Patient Instructions (Signed)
If you don't hear from [place], continue the Keflex.

## 2016-11-07 NOTE — Progress Notes (Signed)
Pre visit review using our clinic review tool, if applicable. No additional management support is needed unless otherwise documented below in the visit note. 

## 2016-11-07 NOTE — Progress Notes (Signed)
Chief Complaint  Patient presents with  . Urinary Tract Infection    urgent,burning,frequ urination-x 3 days    Debbie Hayes is a 66 y.o. female here for possible UTI.  Duration: 3 days.  Symptoms: urinary frequency, urgency, pain Denies: hematuria, fever, chills, nausea, vomiting and flank pain Hx of recurrent UTI? Yes- takes Macrobid 50 mg daily Denies new sexual partners.  ROS:  Constitutional: denies fever GU: As noted in HPI MSK: Denies back pain Abd: Denies constipation or abdominal pain  Past Medical History:  Diagnosis Date  . Allergy   . Anxiety   . Cataract   . Chronic migraine BOTOX INJECTION EVERY 3 MONTHS  . Chronic pain in right shoulder   . Constipation   . Diabetes mellitus   . Disorder of inner ear CAUSES VERTIGO OCCASIONALLY  . Fibromyalgia   . GERD (gastroesophageal reflux disease)   . Hemorrhoid   . History of bladder infections   . History of thyroid cancer 2009  S/P TOTAL THYROIDECTOMY AND RADIATION  . Hyperlipidemia   . Hypertension CARDIOLOGIST- DR Wynonia Lawman- WILL REQUEST LATE NOTE   DENIES S & S  . Hypothyroidism   . Insomnia   . Left shoulder pain   . Macular degeneration   . Normal nuclear stress test 01-25-2009  . OA (osteoarthritis) JOINTS  . PONV (postoperative nausea and vomiting) SEVERE  . Rotator cuff disorder LEFT SHOULDER RTC IMPINGMENT  . Spondylolisthesis of lumbar region   . 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 Wears glasses    Family History  Problem Relation Age of Onset  . Colon polyps Sister 23  . Alzheimer's disease Mother   . Heart disease Father   . Cirrhosis Father   . Heart attack Father   . Macular degeneration Father   . Colon cancer Neg Hx    Social History   Social History  . Marital status: Married   Social History Main Topics  . Smoking status: Never Smoker  . Smokeless tobacco: Never Used  . Alcohol use Yes     Comment: RARE  . Drug use: No    BP 98/70 (BP Location: Left Arm, Patient  Position: Sitting, Cuff Size: Normal)   Pulse 87   Temp 98.2 F (36.8 C) (Oral)   Ht 5\' 8"  (1.727 m)   Wt 179 lb (81.2 kg)   SpO2 96%   BMI 27.22 kg/m  General: Awake, alert, appears stated age HEENT: MMM Heart: RRR, no murmurs Lungs: CTAB, normal respiratory effort, no accessory muscle usage Abd: BS+, soft, NT, ND, no masses or organomegaly MSK: No CVA tenderness, neg Lloyd's sign Psych: Age appropriate judgment and insight  Acute cystitis with hematuria - Plan: cephALEXin (KEFLEX) 500 MG capsule  Orders as above. Culture- will notify patient only if change in therapy. F/u prn otherwise. The patient voiced understanding and agreement to the plan.  [place], DO 11/07/16 3:42 PM

## 2016-11-09 LAB — URINE CULTURE

## 2016-11-10 ENCOUNTER — Telehealth: Payer: Self-pay | Admitting: *Deleted

## 2016-11-10 ENCOUNTER — Ambulatory Visit (INDEPENDENT_AMBULATORY_CARE_PROVIDER_SITE_OTHER): Payer: 59 | Admitting: *Deleted

## 2016-11-10 DIAGNOSIS — N3001 Acute cystitis with hematuria: Secondary | ICD-10-CM

## 2016-11-10 MED ORDER — CEFTRIAXONE SODIUM 1 G IJ SOLR
1.0000 g | Freq: Once | INTRAMUSCULAR | Status: AC
  Administered 2016-11-10: 1 g via INTRAMUSCULAR

## 2016-11-10 NOTE — Telephone Encounter (Signed)
-----   Message from Shelda Pal, DO sent at 11/10/2016  7:20 AM EDT ----- Resistant to most things she is not allergic to. Will reach out, if still having symptoms, will give her injection of Rocephin.  AB- Let pt know her culture confirms UTI. There is a strong likelihood that the E coli that is causing her infection is resistant to both Macrobid and the Keflex we put her on. If she is still having symptoms, please have her come in for a Rocephin injection as that appears to be effective based on the culture results. TY.

## 2016-11-10 NOTE — Telephone Encounter (Signed)
Patient stated that when she stays on the [place] she does not have the UTI's, so she was wondering if the dose of the Macrobid needed to be increased.  She requested to have a letter sent to her Urologist (Dr. Karsten Ro).  To inform him of the culture results,and the resistant to the Macrobid and Keflex.  To see if he would like to increase the Macrobid.  Please advise.//AB/CMA

## 2016-11-10 NOTE — Telephone Encounter (Addendum)
Called and spoke with the pt and informed her of recent lab results and note.  Pt verbalized understanding and agreed to have the Rocephin injection.  Pt stated that she is leaving to go out of town in the morning, and she would like to come today.  Informed the pt she could come this evening.  Pt was placed on the nurse schedule.  Informed Dr. Nani Ravens that the pt would like the injection.  Per Dr. Nani Ravens give the pt Rocephin 1 GM.  Pt came in and was given Rocephin 1 GM IM.  Pt tolerated the injection well.  Pt also asked if she should continue on the Keflex.  Verbally informed Dr. Nani Ravens of the pt's question, and he stated to stop the Keflex because it was useless to take it according to the culture results.  Pt verbalized understanding and agreed.//AB/CMA

## 2016-11-12 ENCOUNTER — Telehealth: Payer: Self-pay | Admitting: Family Medicine

## 2016-11-12 NOTE — Telephone Encounter (Signed)
OK to send culture results to her urologist's office. Let pt know he may want to see her based on his interpretation of results. If she is very concerned, she should schedule appt. TY.

## 2016-11-12 NOTE — Telephone Encounter (Signed)
Caller name: Relationship to patient: Self Can be reached: [phone]  Pharmacy:  Reason for call: Patient request call back to discuss medication.

## 2016-11-12 NOTE — Telephone Encounter (Signed)
Ask her which pharmacy she would like it sent to.   Please call in Levaquin given the large range of bacterial resistance. 250 mg daily for 3 days. TY.

## 2016-11-12 NOTE — Telephone Encounter (Signed)
Patient states she was treated with Keflex for her UTI from Saturday through Monday then given a Rocephiin Injection on Monday. States she is still having urinary frequency and would like another ABO if possible. Patient is out of town at this time. Please advise.

## 2016-11-13 ENCOUNTER — Telehealth: Payer: Self-pay

## 2016-11-13 NOTE — Telephone Encounter (Addendum)
Called and spoke with the pt and informed her of the message below.  Pt wanted to see Dr. Nani Ravens for a follow-up.  Pt was scheduled a follow-up appt for (Fri-11/14/16@ 1:15pm).  Pt stated that she needed to talk with her husband and she will call back.  Pt called back and spoke with Santiago Glad and informed her that she wanted to hold off on taking the ATB and keep the appt on Friday with Dr. Nani Ravens.  Also see telephone message on (11/12/16).//AB/CMA

## 2016-11-13 NOTE — Telephone Encounter (Signed)
Called patient per note from Dr. Nani Ravens. Patient states she did not want ABO called in to pharmacy states she will come in for appointment on tomorrow.

## 2016-11-14 ENCOUNTER — Ambulatory Visit (INDEPENDENT_AMBULATORY_CARE_PROVIDER_SITE_OTHER): Payer: 59 | Admitting: Family Medicine

## 2016-11-14 ENCOUNTER — Other Ambulatory Visit (HOSPITAL_COMMUNITY)
Admission: RE | Admit: 2016-11-14 | Discharge: 2016-11-14 | Disposition: A | Discharge status: Home / Self Care | Payer: 59 | Source: Ambulatory Visit | Attending: Family Medicine | Admitting: Family Medicine

## 2016-11-14 ENCOUNTER — Encounter: Payer: Self-pay | Admitting: Family Medicine

## 2016-11-14 VITALS — BP 130/62 | HR 76 | Temp 97.9°F | Ht 68.0 in | Wt 179.4 lb

## 2016-11-14 DIAGNOSIS — N3001 Acute cystitis with hematuria: Secondary | ICD-10-CM

## 2016-11-14 LAB — POC URINALSYSI DIPSTICK (AUTOMATED)
BILIRUBIN UA: NEGATIVE
Blood, UA: NEGATIVE
GLUCOSE UA: NEGATIVE
KETONES UA: NEGATIVE
LEUKOCYTES UA: NEGATIVE
NITRITE UA: NEGATIVE
PH UA: 6 (ref 5.0–8.0)
PROTEIN UA: NEGATIVE
Spec Grav, UA: >=1.03 — AB (ref 1.010–1.025)
Urobilinogen, UA: 0.2 E.U./dL

## 2016-11-14 MED ORDER — CEFDINIR 300 MG PO CAPS: 300.0000 mg | ORAL_CAPSULE | Freq: Two times a day (BID) | ORAL | 0 refills | Status: DC

## 2016-11-14 MED FILL — CEFDINIR 300 MG CAPSULE: 300 | 7 days supply | Qty: 14 | Fill #0

## 2016-11-14 NOTE — Addendum Note (Signed)
Addended by: Harl Bowie on: 11/14/2016 02:17 PM   Modules accepted: Orders

## 2016-11-14 NOTE — Progress Notes (Signed)
Pre visit review using our clinic review tool, if applicable. No additional management support is needed unless otherwise documented below in the visit note. 

## 2016-11-14 NOTE — Progress Notes (Signed)
Chief Complaint  Patient presents with  . Follow-up    on UTI-pt states she still having freq. urination    Subjective: Patient is a 66 y.o. female here for f.u UTI.  Pt was seen 1 week ago for cystitis w hematuria. Tx'd with Keflex, found to be resistant and brought in for a shot of Rocephin. Still having urinary frequency and she wanted to make sure that everything looks OK. She does have a hx of constipation 2/2 Trazodone use. Denies fevers, abd pain, pain, or blood in urine.   Culture showed susceptibility to carbapenems, FQ's, and 3rd gen cephalosporins.   ROS: GU: as noted in HPI  Family History  Problem Relation Age of Onset  . Colon polyps Sister 44  . Alzheimer's disease Mother   . Heart disease Father   . Cirrhosis Father   . Heart attack Father   . Macular degeneration Father   . Colon cancer Neg Hx    Past Medical History:  Diagnosis Date  . Allergy   . Anxiety   . Cataract   . Chronic migraine BOTOX INJECTION EVERY 3 MONTHS  . Chronic pain in right shoulder   . Constipation   . Diabetes mellitus   . Disorder of inner ear CAUSES VERTIGO OCCASIONALLY  . Fibromyalgia   . GERD (gastroesophageal reflux disease)   . Hemorrhoid   . History of bladder infections   . History of thyroid cancer 2009  S/P TOTAL THYROIDECTOMY AND RADIATION  . Hyperlipidemia   . Hypertension CARDIOLOGIST- DR Wynonia Lawman- WILL REQUEST LATE NOTE   DENIES S & S  . Hypothyroidism   . Insomnia   . Left shoulder pain   . Macular degeneration   . Normal nuclear stress test 01-25-2009  . OA (osteoarthritis) JOINTS  . PONV (postoperative nausea and vomiting) SEVERE  . Rotator cuff disorder LEFT SHOULDER RTC IMPINGMENT  . Spondylolisthesis of lumbar region   . 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 Wears glasses    Allergies  Allergen Reactions  . Ciprofloxacin Diarrhea  . Other Other (See Comments)    Artificial sweetners  . Sulfa Antibiotics Hives    Current Outpatient  Prescriptions:  .  acetaminophen (TYLENOL) 500 MG tablet, Take 500 mg by mouth every 6 (six) hours as needed., Disp: , Rfl:  .  aspirin EC 81 MG tablet, Take 1 tablet (81 mg total) by mouth 2 (two) times daily., Disp: 84 tablet, Rfl: 0 .  azelastine (ASTELIN) 0.1 % nasal spray, PLACE 2 SPRAYS INTO BOTH NOSTRILS 2 TIMES DAILY AS DIRECTED, Disp: 90 mL, Rfl: 0 .  blood glucose meter kit and supplies KIT, Test blood sugar once daily. Dx code: E11.9, Disp: 1 each, Rfl: 0 .  cetirizine (ZYRTEC) 10 MG tablet, Take 10 mg by mouth daily as needed for allergies. , Disp: , Rfl:  .  eletriptan (RELPAX) 20 MG tablet, TAKE 1 TABLET BY MOUTH AS NEEDED FOR MIGRAINE OR HEADACHE. MAY REPEAT IN 2 HOURS IF HEADACHE PERSISTS OR RECURS., Disp: 9 tablet, Rfl: 4 .  levothyroxine (SYNTHROID, LEVOTHROID) 137 MCG tablet, Take 1 tablet (137 mcg total) by mouth daily before breakfast., Disp: 90 tablet, Rfl: 1 .  lidocaine (LIDODERM) 5 %, Place 1 patch onto the skin as needed (for pain). Remove & Discard patch within 12 hours or as directed by MD, Disp: , Rfl:  .  losartan (COZAAR) 50 MG tablet, Take 2 tablets (100 mg total) by mouth daily., Disp: 180 tablet, Rfl: 3 .  metaxalone (SKELAXIN) 800 MG tablet, TAKE 1 TABLET BY MOUTH 3 TIMES DAILY., Disp: 90 tablet, Rfl: 2 .  metFORMIN (GLUCOPHAGE) 1000 MG tablet, TAKE 1 TABLET BY MOUTH 2 TIMES DAILY WITH A MEAL., Disp: 180 tablet, Rfl: 0 .  metoprolol succinate (TOPROL-XL) 25 MG 24 hr tablet, Take 1 tablet (25 mg total) by mouth every morning., Disp: 90 tablet, Rfl: 3 .  montelukast (SINGULAIR) 10 MG tablet, Take 1 tablet (10 mg total) by mouth at bedtime., Disp: 90 tablet, Rfl: 0 .  Multiple Vitamins-Minerals (ICAPS AREDS 2) CAPS, Take by mouth., Disp: , Rfl:  .  NASONEX 50 MCG/ACT nasal spray, PLACE 2 SPRAYS INTO THE NOSE DAILY., Disp: 17 g, Rfl: 9 .  nitrofurantoin (MACRODANTIN) 50 MG capsule, Take 50 mg by mouth at bedtime. , Disp: , Rfl:  .  NON FORMULARY, , Disp: , Rfl:  .   polyethylene glycol (MIRALAX / GLYCOLAX) packet, Take 17 g by mouth daily., Disp: , Rfl:  .  pravastatin (PRAVACHOL) 10 MG tablet, Take 1 tablet (10 mg total) by mouth daily., Disp: 90 tablet, Rfl: 3 .  Probiotic CAPS, Take 1 capsule by mouth 2 (two) times daily with a meal., Disp: , Rfl:  .  sitaGLIPtin (JANUVIA) 100 MG tablet, Take 1 tablet (100 mg total) by mouth daily., Disp: 90 tablet, Rfl: 3 .  traZODone (DESYREL) 100 MG tablet, TAKE 2 or 2.5 TABLETS BY MOUTH AT BEDTIME (Patient taking differently: TAKE  2.5 TABLETS ( Total 250 MG) BY MOUTH AT BEDTIME), Disp: 225 tablet, Rfl: 3 .  zolpidem (AMBIEN) 5 MG tablet, Take one half tablet at night as needed for sleep, Disp: 30 tablet, Rfl: 1 .  cefdinir (OMNICEF) 300 MG capsule, Take 1 capsule (300 mg total) by mouth 2 (two) times daily., Disp: 14 capsule, Rfl: 0  Objective: BP 130/62 (BP Location: Left Arm, Patient Position: Sitting, Cuff Size: Normal)   Pulse 76   Temp 97.9 F (36.6 C) (Oral)   Ht '5\' 8"'$  (1.727 m)   Wt 179 lb 6.4 oz (81.4 kg)   SpO2 97%   BMI 27.28 kg/m  General: Awake, appears stated age Lungs: No accessory muscle use Abd: BS+, soft, NT, ND, no masses or organomegaly MSK: No CVA tenderness Psych: Age appropriate judgment and insight, normal affect and mood  Assessment and Plan: Acute cystitis with hematuria - Plan: cefdinir (OMNICEF) 300 MG capsule, DISCONTINUED: cefdinir (OMNICEF) 300 MG capsule  Orders as above. Will call in abx, hold off until culture comes back. If symptoms worsen or fail to improve, use abx.  Discussed how constipation can contribute to constipation. Laxatives and possible enema recommended.  Fu prn.  The patient voiced understanding and agreement to the plan.  [place], DO 11/14/16  1:47 PM

## 2016-11-14 NOTE — Patient Instructions (Signed)
Be mindful of your stools as this can contribute to frequency.   Wait until Monday to hear the results of your culture. Hold off on antibiotic until then as well.

## 2016-11-16 LAB — URINE CULTURE

## 2016-11-17 LAB — URINE CYTOLOGY ANCILLARY ONLY
CHLAMYDIA, DNA PROBE: NEGATIVE
NEISSERIA GONORRHEA: NEGATIVE
Trichomonas: NEGATIVE

## 2016-11-19 ENCOUNTER — Other Ambulatory Visit: Payer: Self-pay | Admitting: Family Medicine

## 2016-11-19 DIAGNOSIS — E088 Diabetes mellitus due to underlying condition with unspecified complications: Secondary | ICD-10-CM

## 2016-11-19 MED FILL — JANUVIA 100 MG TABLET: 100 | 30 days supply | Qty: 30 | Fill #3

## 2016-11-19 MED FILL — ELETRIPTAN HBR 20 MG TABLET: 20 | 30 days supply | Qty: 9 | Fill #1

## 2016-11-19 MED FILL — METOPROLOL SUCC ER 25 MG TA: 25 | 90 days supply | Qty: 90 | Fill #3

## 2016-11-21 NOTE — Telephone Encounter (Signed)
Done

## 2016-11-28 ENCOUNTER — Encounter: Payer: Self-pay | Admitting: Family Medicine

## 2016-11-28 DIAGNOSIS — I10 Essential (primary) hypertension: Secondary | ICD-10-CM

## 2016-11-28 MED FILL — PRAVASTATIN NA 10 MG TAB: 10 | 90 days supply | Qty: 90 | Fill #1

## 2016-11-28 MED FILL — metFORMIN HCL 1000 MG TABS: 1000 | 90 days supply | Qty: 180 | Fill #0

## 2016-11-28 MED FILL — NITROFURANTOIN MCR 50 MG CA: 50 | 30 days supply | Qty: 30 | Fill #3

## 2016-12-01 MED ORDER — LOSARTAN POTASSIUM 25 MG PO TABS: 50.0000 mg | ORAL_TABLET | Freq: Every day | ORAL | 3 refills | Status: DC

## 2016-12-01 MED FILL — LOSARTAN POTASSIUM 25 MG TA: 25 | 90 days supply | Qty: 180 | Fill #0

## 2016-12-12 ENCOUNTER — Other Ambulatory Visit: Payer: Self-pay | Admitting: Family Medicine

## 2016-12-12 ENCOUNTER — Encounter: Payer: Self-pay | Admitting: Family Medicine

## 2016-12-12 MED FILL — traZODone HCL 100 MG TABS: 100 | 90 days supply | Qty: 225 | Fill #1

## 2016-12-12 MED FILL — ZOLPIDEM TARTRATE 5 MG TAB: 5 | 60 days supply | Qty: 30 | Fill #1

## 2016-12-12 MED FILL — MONTELUKAST SOD 10 MG TAB: 10 | 90 days supply | Qty: 90 | Fill #0

## 2016-12-15 ENCOUNTER — Other Ambulatory Visit: Payer: Self-pay | Admitting: Emergency Medicine

## 2016-12-15 MED ORDER — MONTELUKAST SODIUM 10 MG PO TABS: ORAL_TABLET | ORAL | 0 refills | Status: DC

## 2016-12-18 ENCOUNTER — Ambulatory Visit: Payer: 59 | Admitting: Family Medicine

## 2016-12-23 MED FILL — JANUVIA 100 MG TABLET: 100 | 30 days supply | Qty: 30 | Fill #4

## 2016-12-24 ENCOUNTER — Ambulatory Visit (INDEPENDENT_AMBULATORY_CARE_PROVIDER_SITE_OTHER): Payer: 59 | Admitting: Family Medicine

## 2016-12-24 VITALS — BP 122/64 | HR 73 | Temp 98.0°F | Ht 68.0 in | Wt 178.0 lb

## 2016-12-24 DIAGNOSIS — E119 Type 2 diabetes mellitus without complications: Secondary | ICD-10-CM

## 2016-12-24 DIAGNOSIS — I10 Essential (primary) hypertension: Secondary | ICD-10-CM | POA: Diagnosis not present

## 2016-12-24 DIAGNOSIS — Z85850 Personal history of malignant neoplasm of thyroid: Secondary | ICD-10-CM

## 2016-12-24 DIAGNOSIS — R200 Anesthesia of skin: Secondary | ICD-10-CM | POA: Diagnosis not present

## 2016-12-24 DIAGNOSIS — J3089 Other allergic rhinitis: Secondary | ICD-10-CM | POA: Diagnosis not present

## 2016-12-24 DIAGNOSIS — E782 Mixed hyperlipidemia: Secondary | ICD-10-CM

## 2016-12-24 DIAGNOSIS — R202 Paresthesia of skin: Secondary | ICD-10-CM | POA: Diagnosis not present

## 2016-12-24 LAB — COMPREHENSIVE METABOLIC PANEL
ALBUMIN: 4.3 g/dL (ref 3.5–5.2)
ALK PHOS: 57 U/L (ref 39–117)
ALT: 13 U/L (ref 0–35)
AST: 14 U/L (ref 0–37)
BUN: 12 mg/dL (ref 6–23)
CALCIUM: 9.4 mg/dL (ref 8.4–10.5)
CHLORIDE: 102 meq/L (ref 96–112)
CO2: 28 mEq/L (ref 19–32)
CREATININE: 0.51 mg/dL (ref 0.40–1.20)
GFR: 127.67 mL/min (ref >60.00)
Glucose, Bld: 175 mg/dL — ABNORMAL HIGH (ref 70–99)
POTASSIUM: 3.6 meq/L (ref 3.5–5.1)
Sodium: 136 mEq/L (ref 135–145)
TOTAL PROTEIN: 6.9 g/dL (ref 6.0–8.3)
Total Bilirubin: 0.3 mg/dL (ref 0.2–1.2)

## 2016-12-24 LAB — HEMOGLOBIN A1C: Hgb A1c MFr Bld: 6.3 % (ref 4.6–6.5)

## 2016-12-24 LAB — LIPID PANEL
CHOLESTEROL: 206 mg/dL — AB (ref 0–200)
HDL: 49 mg/dL (ref >39.00)
LDL CALC: 128 mg/dL — AB (ref 0–99)
NonHDL: 157.3
TRIGLYCERIDES: 145 mg/dL (ref 0.0–149.0)
Total CHOL/HDL Ratio: 4
VLDL: 29 mg/dL (ref 0.0–40.0)

## 2016-12-24 LAB — TSH: TSH: 0.32 u[IU]/mL — AB (ref 0.35–4.50)

## 2016-12-24 MED ORDER — MONTELUKAST SODIUM 10 MG PO TABS: ORAL_TABLET | ORAL | 3 refills | Status: DC

## 2016-12-24 NOTE — Patient Instructions (Signed)
Always a pleasure to see you!  Take care and I will be in touch with your labs asap I will be sure to fax your TSH and thyroglobulin levels to Dr. B; however it never hurts to take a copy with you as well in case they get lost

## 2016-12-24 NOTE — Progress Notes (Addendum)
[facility] [address], [place], [place] [address] [phone]  Date:  12/24/2016   Name:  Debbie Hayes   DOB:  [birth year]   MRN:  [id number]  PCP:  Darreld Mclean, MD    Chief Complaint: Follow-up (Pt here for 4 month f/u for diabetes. ) and Congestive Heart Failure   History of Present Illness:  Debbie Hayes is a 66 y.o. very pleasant female patient who presents with the following:  Lab Results  Component Value Date   HGBA1C 6.5 07/31/2016   Wt Readings from Last 3 Encounters:  12/24/16 178 lb (80.7 kg)  11/14/16 179 lb 6.4 oz (81.4 kg)  11/07/16 179 lb (81.2 kg)   BP Readings from Last 3 Encounters:  12/24/16 122/64  11/14/16 130/62  11/07/16 98/70   She is taking pravachol 10 and is tolerating this well- would be willing to try increasing the dose if necessary  She is not fasting but only had oatmeal this morning  She will see her eye doctor in a couple of months - she has a regular eye doctor and also a retinal specialist She is seeing her endocrinologist coming up- she sees Dr. Chalmers Cater- needs TSH and throglobulin drawn for her appt which we will do for her today History of thyroidectomy for thyroid cancer about 10 years ago- throglobulin level is to monitor for any return of cancer  She is using a dental appliance for her OSA- this is working well for her.  She is not on a CPAP machine She is using [place] on occasion- she does not use this most days and does not need a refill today  She has noted some tingling on her right arm and hand for the last 3 weeks or so- she will notice an intermittent feeling "like bugs crawling" on her arm- might think that something is on her arm but then nothing is there.  She had a similar issue on the left years ago and had a negative MRI of her head The sx will come and go with no apparent trigger- not positional No unusual HA There is no associated pain  or weakness, no other neurological sx noted.  At this time [name] would prefer to monitor this issue but will let me know if tere is any change   She has had prevnar and pneumovax already  Patient Active Problem List   Diagnosis Date Noted  . Snoring 03/25/2016  . Palpitations 03/25/2016  . Sleep related headaches 03/25/2016  . Trigeminal anesthesia 11/26/2015  . Spondylolisthesis of lumbar region 08/09/2014  . Back pain 02/17/2014  . Macular degeneration 02/17/2014  . Other and unspecified hyperlipidemia 10/14/2013  . Onychomycosis 12/01/2012  . Diabetes mellitus ([place]) 04/12/2012  . Arthritis 04/12/2012  . Hypothyroid 04/12/2012    Past Medical History:  Diagnosis Date  . Allergy   . Anxiety   . Cataract   . Chronic migraine BOTOX INJECTION EVERY 3 MONTHS  . Chronic pain in right shoulder   . Constipation   . Diabetes mellitus   . Disorder of inner ear CAUSES VERTIGO OCCASIONALLY  . Fibromyalgia   . GERD (gastroesophageal reflux disease)   . Hemorrhoid   . History of bladder infections   . History of thyroid cancer 2009  S/P TOTAL THYROIDECTOMY AND RADIATION  . Hyperlipidemia   . Hypertension CARDIOLOGIST- DR Wynonia Lawman- WILL REQUEST LATE NOTE   DENIES S & S  .  Hypothyroidism   . Insomnia   . Left shoulder pain   . Macular degeneration   . Normal nuclear stress test 01-25-2009  . OA (osteoarthritis) JOINTS  . PONV (postoperative nausea and vomiting) SEVERE  . Rotator cuff disorder LEFT SHOULDER RTC IMPINGMENT  . Spondylolisthesis of lumbar region   . 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 Wears glasses     Past Surgical History:  Procedure Laterality Date  . BILATERAL CARPAL TUNNEL RELEASE  1994  . BILATERAL ELBOW SURG.  1999  . BILATERAL SALPINGOOPHECTOMY  1993   POST-OP URETER REPAIR 12 DAYS AFTER   . FOOT ARTHRODESIS Left 07/10/2016   Procedure: LEFT 2ND TARSAL METATARSAL ARTHRODESIS  GASTROC RECESSION LEFT LAPIDUS MODIFIED MCBRIDE BUIONECTOMY;  Surgeon: Wylene Simmer, MD;  Location: [facility];  Service: Orthopedics;  Laterality: Left;  Marland Kitchen GASTROC RECESSION EXTREMITY Left 07/10/2016   Procedure: GASTROC RECESSION EXTREMITY;  Surgeon: Wylene Simmer, MD;  Location: [facility];  Service: Orthopedics;  Laterality: Left;  . LEFT THUMB JOINT REPLACEMENT  2005   RIGHT DONE IN 2004  . RIGHT KNEE ARTHROSCOPY  X2  BEFORE 2011  . RIGHT KNEE ARTHROSCOPY/ PARTIAL LATERAL MENISECTOMY/ TRICOMPARTMENT CHONDROPLASTY/ DECOMPRESSION CYST  10-26-2009  . RIGHT KNEE CLOSED MANIPULATION  09-11-2010  . RIGHT SHOULDER ARTHROSCOPY  2010  &  2004  . TIBIA CYST REMOVED AND ORIF LEG FX  1958  . TONSILLECTOMY  1968  . TOTAL KNEE ARTHROPLASTY  07-16-2010   RIGHT  . TOTAL THYROIDECTOMY  2009   CANCER  (POST-OP BLEED)  AND RADIATION [place]  . trigger fingers Right 2013   3rd and 4th fingers  . VAGINAL HYSTERECTOMY  1990    Social History  Substance Use Topics  . Smoking status: Never Smoker  . Smokeless tobacco: Never Used  . Alcohol use Yes     Comment: RARE    Family History  Problem Relation Age of Onset  . Colon polyps Sister 31  . Alzheimer's disease Mother   . Heart disease Father   . Cirrhosis Father   . Heart attack Father   . Macular degeneration Father   . Colon cancer Neg Hx     Allergies  Allergen Reactions  . Ciprofloxacin Diarrhea  . Other Other (See Comments)    Artificial sweetners  . Sulfa Antibiotics Hives    Medication list has been reviewed and updated.  Current Outpatient Prescriptions on File Prior to Visit  Medication Sig Dispense Refill  . acetaminophen (TYLENOL) 500 MG tablet Take 500 mg by mouth every 6 (six) hours as needed.    Marland Kitchen aspirin EC 81 MG tablet Take 1 tablet (81 mg total) by mouth 2 (two) times daily. 84 tablet 0  . azelastine (ASTELIN) 0.1 % nasal spray PLACE 2 SPRAYS INTO BOTH NOSTRILS 2 TIMES DAILY AS DIRECTED 90 mL 0  . blood glucose meter kit and supplies KIT Test blood sugar once daily.  Dx code: E11.9 1 each 0  . cetirizine (ZYRTEC) 10 MG tablet Take 10 mg by mouth daily as needed for allergies.     Marland Kitchen eletriptan (RELPAX) 20 MG tablet TAKE 1 TABLET BY MOUTH AS NEEDED FOR MIGRAINE OR HEADACHE. MAY REPEAT IN 2 HOURS IF HEADACHE PERSISTS OR RECURS. 9 tablet 4  . levothyroxine (SYNTHROID, LEVOTHROID) 137 MCG tablet Take 1 tablet (137 mcg total) by mouth daily before breakfast. 90 tablet 1  . lidocaine (LIDODERM) 5 % Place 1 patch onto the skin as  needed (for pain). Remove & Discard patch within 12 hours or as directed by MD    . metaxalone (SKELAXIN) 800 MG tablet TAKE 1 TABLET BY MOUTH 3 TIMES DAILY. 90 tablet 2  . metFORMIN (GLUCOPHAGE) 1000 MG tablet TAKE 1 TABLET BY MOUTH 2 TIMES DAILY WITH A MEAL. 180 tablet 0  . metoprolol succinate (TOPROL-XL) 25 MG 24 hr tablet Take 1 tablet (25 mg total) by mouth every morning. 90 tablet 3  . montelukast (SINGULAIR) 10 MG tablet TAKE 1 TABLET BY MOUTH AT BEDTIME. 90 tablet 0  . Multiple Vitamins-Minerals (ICAPS AREDS 2) CAPS Take by mouth.    Marland Kitchen NASONEX 50 MCG/ACT nasal spray PLACE 2 SPRAYS INTO THE NOSE DAILY. 17 g 9  . nitrofurantoin (MACRODANTIN) 50 MG capsule Take 50 mg by mouth at bedtime.     . NON FORMULARY     . polyethylene glycol (MIRALAX / GLYCOLAX) packet Take 17 g by mouth daily.    . pravastatin (PRAVACHOL) 10 MG tablet Take 1 tablet (10 mg total) by mouth daily. 90 tablet 3  . Probiotic CAPS Take 1 capsule by mouth 2 (two) times daily with a meal.    . sitaGLIPtin (JANUVIA) 100 MG tablet Take 1 tablet (100 mg total) by mouth daily. 90 tablet 3  . zolpidem (AMBIEN) 5 MG tablet Take one half tablet at night as needed for sleep 30 tablet 1   No current facility-administered medications on file prior to visit.     Review of Systems:  As per HPI- otherwise negative. No nausea, vomiting, fever, chills, rash.   Walks for exercise  Physical Examination: Vitals:   12/24/16 1019  BP: 122/64  Pulse: 73  Temp: 98 F (36.7 C)    Vitals:   12/24/16 1019  Weight: 178 lb (80.7 kg)  Height: '5\' 8"'$  (1.727 m)   Body mass index is 27.06 kg/m. Ideal Body Weight: Weight in (lb) to have BMI = 25: 164.1  GEN: WDWN, NAD, Non-toxic, A & O x 3, looks well, mild overweight HEENT: Atraumatic, Normocephalic. Neck supple. No masses, No LAD.  Bilateral TM wnl, oropharynx normal.  PEERL,EOMI.   Ears and Nose: No external deformity. CV: RRR, No M/G/R. No JVD. No thrill. No extra heart sounds. PULM: CTA B, no wheezes, crackles, rhonchi. No retractions. No resp. distress. No accessory muscle use. ABD: S, NT, ND EXTR: No c/c/e NEURO Normal gait.  PSYCH: Normally interactive. Conversant. Not depressed or anxious appearing.  Calm demeanor.  Foot exam today- she has healed well from her left foot surgery Normal strength and sensation of right arm I cannot reproduce the tingling sensation with manipulation of her head or neck   Assessment and Plan: Essential hypertension  History of thyroid cancer - Plan: TSH, Thyroglobulin Level  Diabetes mellitus without complication ([facility]) - Plan: Hemoglobin A1c, Comprehensive metabolic panel  Mixed hyperlipidemia - Plan: Lipid panel  Numbness and tingling of right arm  Seasonal allergic rhinitis due to other allergic trigger - Plan: montelukast (SINGULAIR) 10 MG tablet  Here today for a follow-up visit BP is well controlled Check A1c and CMP today Check cholesterol- she is now taking a statin and tolerating a low dose of Pravachol well Refilled her singulair Foot exam today She gets regular eye exams and will be seen again this summer For the time being she prefers to monitor her right arm tingling but will keep me posted if any change    Signed Lamar Blinks, MD  Results for  orders placed or performed in visit on 12/24/16  Hemoglobin A1c  Result Value Ref Range   Hgb A1c MFr Bld 6.3 4.6 - 6.5 %  Lipid panel  Result Value Ref Range   Cholesterol 206 (H) 0 - 200 mg/dL    Triglycerides 145.0 0.0 - 149.0 mg/dL   HDL 49.00 >39.00 mg/dL   VLDL 29.0 0.0 - 40.0 mg/dL   LDL Cholesterol 128 (H) 0 - 99 mg/dL   Total CHOL/HDL Ratio 4    NonHDL 157.30   Comprehensive metabolic panel  Result Value Ref Range   Sodium 136 135 - 145 mEq/L   Potassium 3.6 3.5 - 5.1 mEq/L   Chloride 102 96 - 112 mEq/L   CO2 28 19 - 32 mEq/L   Glucose, Bld 175 (H) 70 - 99 mg/dL   BUN 12 6 - 23 mg/dL   Creatinine, Ser 0.51 0.40 - 1.20 mg/dL   Total Bilirubin 0.3 0.2 - 1.2 mg/dL   Alkaline Phosphatase 57 39 - 117 U/L   AST 14 0 - 37 U/L   ALT 13 0 - 35 U/L   Total Protein 6.9 6.0 - 8.3 g/dL   Albumin 4.3 3.5 - 5.2 g/dL   Calcium 9.4 8.4 - 10.5 mg/dL   GFR 127.67 >60.00 mL/min  TSH  Result Value Ref Range   TSH 0.32 (L) 0.35 - 4.50 uIU/mL   Message to pt with labs so far

## 2016-12-25 ENCOUNTER — Encounter: Payer: Self-pay | Admitting: Family Medicine

## 2016-12-25 LAB — THYROGLOBULIN LEVEL

## 2016-12-30 MED FILL — LEVOTHYROXINE 137 MCG TABLE: 137 | 90 days supply | Qty: 90 | Fill #0

## 2017-01-05 DIAGNOSIS — N3281 Overactive bladder: Secondary | ICD-10-CM | POA: Diagnosis not present

## 2017-01-05 DIAGNOSIS — N3020 Other chronic cystitis without hematuria: Secondary | ICD-10-CM | POA: Diagnosis not present

## 2017-01-05 MED FILL — ELETRIPTAN HBR 20 MG TABLET: 20 | 30 days supply | Qty: 9 | Fill #2

## 2017-01-05 MED FILL — METAXALONE 800 MG TABLET: 800 | 30 days supply | Qty: 90 | Fill #2

## 2017-01-07 MED FILL — NITROFURANTOIN MCR 50 MG CA: 50 | 30 days supply | Qty: 30 | Fill #4

## 2017-01-08 DIAGNOSIS — E119 Type 2 diabetes mellitus without complications: Secondary | ICD-10-CM | POA: Diagnosis not present

## 2017-01-08 LAB — HM DIABETES EYE EXAM

## 2017-01-08 MED FILL — OLOPATADINE HCL 0.2 % SOLN: 0.2 | 25 days supply | Qty: 3 | Fill #0

## 2017-01-18 ENCOUNTER — Encounter: Payer: Self-pay | Admitting: Family Medicine

## 2017-01-18 DIAGNOSIS — E039 Hypothyroidism, unspecified: Secondary | ICD-10-CM

## 2017-01-19 MED ORDER — LEVOTHYROXINE SODIUM 125 MCG PO TABS: 125.0000 ug | ORAL_TABLET | Freq: Every day | ORAL | 5 refills | Status: DC

## 2017-01-19 MED FILL — LEVOTHYROXINE 125 MCG TABLE: 125 | 30 days supply | Qty: 30 | Fill #0

## 2017-01-19 NOTE — Telephone Encounter (Signed)
Pt on 137 mcg Lab Results  Component Value Date   TSH 0.32 (L) 12/24/2016

## 2017-01-22 MED FILL — JANUVIA 100 MG TABLET: 100 | 30 days supply | Qty: 30 | Fill #5

## 2017-01-29 MED FILL — ELETRIPTAN HBR 20 MG TABLET: 20 | 30 days supply | Qty: 9 | Fill #3

## 2017-01-31 ENCOUNTER — Encounter: Payer: Self-pay | Admitting: Family Medicine

## 2017-01-31 DIAGNOSIS — IMO0002 Reserved for concepts with insufficient information to code with codable children: Secondary | ICD-10-CM

## 2017-01-31 DIAGNOSIS — G43709 Chronic migraine without aura, not intractable, without status migrainosus: Secondary | ICD-10-CM

## 2017-02-02 ENCOUNTER — Ambulatory Visit (INDEPENDENT_AMBULATORY_CARE_PROVIDER_SITE_OTHER): Payer: 59 | Admitting: Urgent Care

## 2017-02-02 ENCOUNTER — Encounter: Payer: Self-pay | Admitting: Urgent Care

## 2017-02-02 VITALS — BP 113/69 | HR 74 | Temp 98.3°F | Resp 16 | Ht 68.0 in | Wt 181.2 lb

## 2017-02-02 DIAGNOSIS — R51 Headache: Secondary | ICD-10-CM | POA: Diagnosis not present

## 2017-02-02 DIAGNOSIS — S0101XA Laceration without foreign body of scalp, initial encounter: Secondary | ICD-10-CM | POA: Diagnosis not present

## 2017-02-02 DIAGNOSIS — R519 Headache, unspecified: Secondary | ICD-10-CM

## 2017-02-02 NOTE — Patient Instructions (Addendum)
Laceration Care, Adult A laceration is a cut that goes through all of the layers of the skin and into the tissue that is right under the skin. Some lacerations heal on their own. Others need to be closed with stitches (sutures), staples, skin adhesive strips, or skin glue. Proper laceration care minimizes the risk of infection and helps the laceration to heal better. How is this treated? If sutures or staples were used:  Keep the wound clean and dry.  If you were given a bandage (dressing), you should change it at least one time per day or as told by your health care provider. You should also change it if it becomes wet or dirty.  Keep the wound completely dry for the first 24 hours or as told by your health care provider. After that time, you may shower or bathe. However, make sure that the wound is not soaked in water until after the sutures or staples have been removed.  Clean the wound one time each day or as told by your health care provider: ? Wash the wound with soap and water. ? Rinse the wound with water to remove all soap. ? Pat the wound dry with a clean towel. Do not rub the wound.  After cleaning the wound, apply a thin layer of antibiotic ointmentas told by your health care provider. This will help to prevent infection and keep the dressing from sticking to the wound.  Have the sutures or staples removed as told by your health care provider. If skin adhesive strips were used:  Keep the wound clean and dry.  If you were given a bandage (dressing), you should change it at least one time per day or as told by your health care provider. You should also change it if it becomes dirty or wet.  Do not get the skin adhesive strips wet. You may shower or bathe, but be careful to keep the wound dry.  If the wound gets wet, pat it dry with a clean towel. Do not rub the wound.  Skin adhesive strips fall off on their own. You may trim the strips as the wound heals. Do not remove skin  adhesive strips that are still stuck to the wound. They will fall off in time. If skin glue was used:  Try to keep the wound dry, but you may briefly wet it in the shower or bath. Do not soak the wound in water, such as by swimming.  After you have showered or bathed, gently pat the wound dry with a clean towel. Do not rub the wound.  Do not do any activities that will make you sweat heavily until the skin glue has fallen off on its own.  Do not apply liquid, cream, or ointment medicine to the wound while the skin glue is in place. Using those may loosen the film before the wound has healed.  If you were given a bandage (dressing), you should change it at least one time per day or as told by your health care provider. You should also change it if it becomes dirty or wet.  If a dressing is placed over the wound, be careful not to apply tape directly over the skin glue. Doing that may cause the glue to be pulled off before the wound has healed.  Do not pick at the glue. The skin glue usually remains in place for 5-10 days, then it falls off of the skin. General Instructions  Take over-the-counter and prescription medicines only   as told by your health care provider.  If you were prescribed an antibiotic medicine or ointment, take or apply it as told by your doctor. Do not stop using it even if your condition improves.  To help prevent scarring, make sure to cover your wound with sunscreen whenever you are outside after stitches are removed, after adhesive strips are removed, or when glue remains in place and the wound is healed. Make sure to wear a sunscreen of at least 30 SPF.  Do not scratch or pick at the wound.  Keep all follow-up visits as told by your health care provider. This is important.  Check your wound every day for signs of infection. Watch for:  Redness, swelling, or pain.  Fluid, blood, or pus.  Raise (elevate) the injured area above the level of your heart while you  are sitting or lying down, if possible. Contact a health care provider if:  You received a tetanus shot and you have swelling, severe pain, redness, or bleeding at the injection site.  You have a fever.  A wound that was closed breaks open.  You notice a bad smell coming from your wound or your dressing.  You notice something coming out of the wound, such as wood or glass.  Your pain is not controlled with medicine.  You have increased redness, swelling, or pain at the site of your wound.  You have fluid, blood, or pus coming from your wound.  You notice a change in the color of your skin near your wound.  You need to change the dressing frequently due to fluid, blood, or pus draining from the wound.  You develop a new rash.  You develop numbness around the wound. Get help right away if:  You develop severe swelling around the wound.  Your pain suddenly increases and is severe.  You develop painful lumps near the wound or on skin that is anywhere on your body.  You have a red streak going away from your wound.  The wound is on your hand or foot and you cannot properly move a finger or toe.  The wound is on your hand or foot and you notice that your fingers or toes look pale or bluish. This information is not intended to replace advice given to you by your health care provider. Make sure you discuss any questions you have with your health care provider. Document Released: 07/14/2005 Document Revised: 12/14/2015 Document Reviewed: 07/10/2014 Elsevier Interactive Patient Education  2017 [employer]     IF you received an x-ray today, you will receive an invoice from [facility]. Please contact [facility] at [phone] with questions or concerns regarding your invoice.   IF you received labwork today, you will receive an invoice from [facility]. Please contact [facility] at [phone] with questions or concerns regarding your invoice.   Our billing  staff will not be able to assist you with questions regarding bills from these companies.  You will be contacted with the lab results as soon as they are available. The fastest way to get your results is to activate your My Chart account. Instructions are located on the last page of this paperwork. If you have not heard from us regarding the results in 2 weeks, please contact this office.     

## 2017-02-02 NOTE — Progress Notes (Signed)
  MRN: [id number] DOB: 1951/01/04  Subjective:   Debbie Hayes is a 66 y.o. female presenting for chief complaint of Laceration (pt was hit in the head by the [employer] door)  Patient suffered scalp laceration by bumping head against [employer] door today. Denies loss of consciousness, confusion, dizziness, blurred vision. Reports pain around wound over her scalp. Last tdap was 03/10/2013.  Debbie Hayes has a current medication list which includes the following prescription(s): acetaminophen, aspirin ec, azelastine, blood glucose meter kit and supplies, cetirizine, eletriptan, levothyroxine, lidocaine, losartan, metaxalone, metformin, metoprolol succinate, montelukast, icaps areds 2, nasonex, nitrofurantoin, NON FORMULARY, OVER THE COUNTER MEDICATION, polyethylene glycol, pravastatin, probiotic, sitagliptin, trazodone, and zolpidem. Also is allergic to ciprofloxacin; other; and sulfa antibiotics. Debbie Hayes  has a past medical history of Allergy; Anxiety; Cataract; Chronic migraine (BOTOX INJECTION EVERY 3 MONTHS); Chronic pain in right shoulder; Constipation; Diabetes mellitus; Disorder of inner ear (CAUSES VERTIGO OCCASIONALLY); Fibromyalgia; GERD (gastroesophageal reflux disease); Hemorrhoid; History of bladder infections; History of thyroid cancer (2009  S/P TOTAL THYROIDECTOMY AND RADIATION); Hyperlipidemia; Hypertension (CARDIOLOGIST- DR Wynonia Lawman- WILL REQUEST LATE NOTE); Hypothyroidism; Insomnia; Left shoulder pain; Macular degeneration; Normal nuclear stress test (01-25-2009); OA (osteoarthritis) (JOINTS); PONV (postoperative nausea and vomiting) (SEVERE); Rotator cuff disorder (LEFT SHOULDER RTC IMPINGMENT); Spondylolisthesis of lumbar region; 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 Also  has a past surgical history that includes RIGHT KNEE CLOSED MANIPULATION (09-11-2010); Total knee arthroplasty (07-16-2010); RIGHT KNEE ARTHROSCOPY/ PARTIAL LATERAL MENISECTOMY/ TRICOMPARTMENT CHONDROPLASTY/  DECOMPRESSION CYST (10-26-2009); RIGHT KNEE ARTHROSCOPY (X2  BEFORE 2011); Total thyroidectomy (2009); LEFT THUMB JOINT REPLACEMENT (2005); RIGHT SHOULDER ARTHROSCOPY (2010  &  2004); BILATERAL ELBOW SURG. (1999); BILATERAL CARPAL TUNNEL RELEASE (1994); Vaginal hysterectomy (1990); BILATERAL SALPINGOOPHECTOMY (1993); Tonsillectomy (1968); TIBIA CYST REMOVED AND ORIF LEG FX (1958); trigger fingers (Right, 2013); Foot arthrodesis (Left, 07/10/2016); and Gastroc recession extremity (Left, 07/10/2016).  Objective:   Vitals: BP 113/69 (BP Location: Right Arm, Patient Position: Sitting, Cuff Size: Normal)   Pulse 74   Temp 98.3 F (36.8 C) (Oral)   Resp 16   Ht '5\' 8"'$  (1.727 m)   Wt 181 lb 3.2 oz (82.2 kg)   SpO2 96%   BMI 27.55 kg/m   Physical Exam  Constitutional: She is oriented to person, place, and time. She appears well-developed and well-nourished.  HENT:  Head: Head is with laceration (~1cm over area depicted).    Cardiovascular: Normal rate.   Pulmonary/Chest: Effort normal.  Neurological: She is alert and oriented to person, place, and time.   PROCEDURE NOTE: laceration repair Verbal consent obtained from patient. Local anesthesia with 3cc Lidocaine 1% with epinephrine. Wound explored for tendon, ligament damage. Wound scrubbed with soap and water and rinsed. Wound closed with #4 staples. Wound cleansed and dressed.  Assessment and Plan :   1. Scalp laceration, initial encounter 2. Scalp pain - Laceration repaired with staples. Wound care reviewed. Tdap is up to date. RTC in 7-10 days for staple removal. Return-to-clinic precautions discussed, patient verbalized understanding.   Jaynee Eagles, PA-C [facility] [phone] 02/02/2017  4:04 PM

## 2017-02-03 ENCOUNTER — Encounter: Payer: Self-pay | Admitting: Neurology

## 2017-02-09 MED FILL — OXYBUTYNIN CL ER 15 MG TAB: 15 | 30 days supply | Qty: 30 | Fill #0

## 2017-02-09 MED FILL — NITROFURANTOIN MCR 50 MG CA: 50 | 30 days supply | Qty: 30 | Fill #0

## 2017-02-16 ENCOUNTER — Ambulatory Visit (INDEPENDENT_AMBULATORY_CARE_PROVIDER_SITE_OTHER): Payer: 59 | Admitting: Medical

## 2017-02-16 VITALS — BP 118/64 | HR 65 | Temp 98.1°F | Resp 16 | Ht 68.0 in | Wt 180.0 lb

## 2017-02-16 DIAGNOSIS — M545 Low back pain, unspecified: Secondary | ICD-10-CM

## 2017-02-16 MED ORDER — PREDNISONE 10 MG PO TABS: ORAL_TABLET | ORAL | 0 refills | Status: DC

## 2017-02-16 MED ORDER — HYDROCODONE-ACETAMINOPHEN 5-325 MG PO TABS: 1.0000 | ORAL_TABLET | Freq: Four times a day (QID) | ORAL | 0 refills | Status: DC | PRN

## 2017-02-16 MED ORDER — HYDROCODONE-ACETAMINOPHEN 5-325 MG PO TABS
1.0000 | ORAL_TABLET | Freq: Four times a day (QID) | ORAL | 0 refills | Status: DC | PRN
Start: 2017-02-16 — End: 2017-04-27

## 2017-02-16 MED FILL — OLOPATADINE HCL 0.2 % SOLN: 0.2 | 25 days supply | Qty: 3 | Fill #1

## 2017-02-16 MED FILL — predniSONE 10 MG TABS: 10 | 5 days supply | Qty: 15 | Fill #0

## 2017-02-16 MED FILL — LEVOTHYROXINE 125 MCG TABLE: 125 | 30 days supply | Qty: 30 | Fill #1

## 2017-02-16 MED FILL — HYDROCODON-APAP 5-325: 5-325 | 3 days supply | Qty: 12 | Fill #0

## 2017-02-16 NOTE — Progress Notes (Signed)
Subjective:    Patient ID: Debbie Hayes, female    DOB: 1951/05/03, 66 y.o.   MRN: [id number]  HPI  Pt in for back pain. Pt has known arthritis back pain and has had surgeries before. Pt has moderate level pain in past. Pain recently has increased past  2 weeks(but no injury or fall). Pt has been on some oxycodone  and she states it did help. Pt had old rx from prior surgery. She only used 2 tablets of narcoctic in past 2 weeks.(she states oxycodone was too strong. It makes her drowsy. She request something less potent/sedating.  Pt states she stopped taking nsaids since she thought caused migraine ha.  Pt does use skelaxin.   Pt last a1c was 6.3 in June. Pt is on metformin and januvia.  Pt spinal surgeon in past was Dr. Arnoldo Morale.  Pt wants me to consider low dose steroids. She states she will watch her sugar intake while on med.  No radiating pain, no leg weakness, no saddle anesthesia or new severe incontinence.   Review of Systems  Constitutional: Negative for chills, fatigue and fever.  Respiratory: Negative for cough, chest tightness, shortness of breath and wheezing.   Cardiovascular: Negative for chest pain and palpitations.  Gastrointestinal: Negative for abdominal pain, blood in stool and constipation.  Genitourinary: Negative for difficulty urinating, dysuria, flank pain, frequency and urgency.  Musculoskeletal: Positive for back pain.  Skin: Negative for rash.  Neurological: Negative for dizziness, weakness and light-headedness.  Psychiatric/Behavioral: Negative for agitation, confusion, dysphoric mood and hallucinations.   Past Medical History:  Diagnosis Date  . Allergy   . Anxiety   . Cataract   . Chronic migraine BOTOX INJECTION EVERY 3 MONTHS  . Chronic pain in right shoulder   . Constipation   . Diabetes mellitus   . Disorder of inner ear CAUSES VERTIGO OCCASIONALLY  . Fibromyalgia   . GERD (gastroesophageal reflux disease)   . Hemorrhoid   .  History of bladder infections   . History of thyroid cancer 2009  S/P TOTAL THYROIDECTOMY AND RADIATION  . Hyperlipidemia   . Hypertension CARDIOLOGIST- DR Wynonia Lawman- WILL REQUEST LATE NOTE   DENIES S & S  . Hypothyroidism   . Insomnia   . Left shoulder pain   . Macular degeneration   . Normal nuclear stress test 01-25-2009  . OA (osteoarthritis) JOINTS  . PONV (postoperative nausea and vomiting) SEVERE  . Rotator cuff disorder LEFT SHOULDER RTC IMPINGMENT  . Spondylolisthesis of lumbar region   . 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 Wears glasses      Social History   Social History  . Marital status: Married    Spouse name: N/A  . Number of children: N/A  . Years of education: N/A   Occupational History  . Not on file.   Social History Main Topics  . Smoking status: Never Smoker  . Smokeless tobacco: Never Used  . Alcohol use Yes     Comment: RARE  . Drug use: No  . Sexual activity: Not on file   Other Topics Concern  . Not on file   Social History Narrative  . No narrative on file    Past Surgical History:  Procedure Laterality Date  . BILATERAL CARPAL TUNNEL RELEASE  1994  . BILATERAL ELBOW SURG.  1999  . BILATERAL SALPINGOOPHECTOMY  1993   POST-OP URETER REPAIR 12 DAYS AFTER   . FOOT ARTHRODESIS Left 07/10/2016   Procedure: LEFT  2ND TARSAL METATARSAL ARTHRODESIS  GASTROC RECESSION LEFT LAPIDUS MODIFIED MCBRIDE BUIONECTOMY;  Surgeon: Wylene Simmer, MD;  Location: [place];  Service: Orthopedics;  Laterality: Left;  Marland Kitchen GASTROC RECESSION EXTREMITY Left 07/10/2016   Procedure: GASTROC RECESSION EXTREMITY;  Surgeon: Wylene Simmer, MD;  Location: [place];  Service: Orthopedics;  Laterality: Left;  . LEFT THUMB JOINT REPLACEMENT  2005   RIGHT DONE IN 2004  . RIGHT KNEE ARTHROSCOPY  X2  BEFORE 2011  . RIGHT KNEE ARTHROSCOPY/ PARTIAL LATERAL MENISECTOMY/ TRICOMPARTMENT CHONDROPLASTY/ DECOMPRESSION CYST  10-26-2009  . RIGHT KNEE  CLOSED MANIPULATION  09-11-2010  . RIGHT SHOULDER ARTHROSCOPY  2010  &  2004  . TIBIA CYST REMOVED AND ORIF LEG FX  1958  . TONSILLECTOMY  1968  . TOTAL KNEE ARTHROPLASTY  07-16-2010   RIGHT  . TOTAL THYROIDECTOMY  2009   CANCER  (POST-OP BLEED)  AND RADIATION TX  . trigger fingers Right 2013   3rd and 4th fingers  . VAGINAL HYSTERECTOMY  1990    Family History  Problem Relation Age of Onset  . Colon polyps Sister 2  . Alzheimer's disease Mother   . Heart disease Father   . Cirrhosis Father   . Heart attack Father   . Macular degeneration Father   . Colon cancer Neg Hx     Allergies  Allergen Reactions  . Ciprofloxacin Diarrhea  . Other Other (See Comments)    Artificial sweetners  . Sulfa Antibiotics Hives    Current Outpatient Prescriptions on File Prior to Visit  Medication Sig Dispense Refill  . acetaminophen (TYLENOL) 500 MG tablet Take 500 mg by mouth every 6 (six) hours as needed.    Marland Kitchen aspirin EC 81 MG tablet Take 1 tablet (81 mg total) by mouth 2 (two) times daily. 84 tablet 0  . azelastine (ASTELIN) 0.1 % nasal spray PLACE 2 SPRAYS INTO BOTH NOSTRILS 2 TIMES DAILY AS DIRECTED 90 mL 0  . blood glucose meter kit and supplies KIT Test blood sugar once daily. Dx code: E11.9 1 each 0  . cetirizine (ZYRTEC) 10 MG tablet Take 10 mg by mouth daily as needed for allergies.     Marland Kitchen eletriptan (RELPAX) 20 MG tablet TAKE 1 TABLET BY MOUTH AS NEEDED FOR MIGRAINE OR HEADACHE. MAY REPEAT IN 2 HOURS IF HEADACHE PERSISTS OR RECURS. 9 tablet 4  . levothyroxine (SYNTHROID, LEVOTHROID) 125 MCG tablet Take 1 tablet (125 mcg total) by mouth daily before breakfast. 30 tablet 5  . lidocaine (LIDODERM) 5 % Place 1 patch onto the skin as needed (for pain). Remove & Discard patch within 12 hours or as directed by MD    . losartan (COZAAR) 50 MG tablet Take 1 tablet by mouth daily.  3  . metaxalone (SKELAXIN) 800 MG tablet TAKE 1 TABLET BY MOUTH 3 TIMES DAILY. 90 tablet 2  . metFORMIN  (GLUCOPHAGE) 1000 MG tablet TAKE 1 TABLET BY MOUTH 2 TIMES DAILY WITH A MEAL. 180 tablet 0  . metoprolol succinate (TOPROL-XL) 25 MG 24 hr tablet Take 1 tablet (25 mg total) by mouth every morning. 90 tablet 3  . montelukast (SINGULAIR) 10 MG tablet TAKE 1 TABLET BY MOUTH AT BEDTIME. 90 tablet 3  . Multiple Vitamins-Minerals (ICAPS AREDS 2) CAPS Take by mouth.    Marland Kitchen NASONEX 50 MCG/ACT nasal spray PLACE 2 SPRAYS INTO THE NOSE DAILY. 17 g 9  . nitrofurantoin (MACRODANTIN) 50 MG capsule Take 50 mg by mouth at bedtime.     Marland Kitchen  NON FORMULARY     . OVER THE COUNTER MEDICATION Take 1 tablet by mouth 2 (two) times daily. Hemp Oil    . polyethylene glycol (MIRALAX / GLYCOLAX) packet Take 17 g by mouth daily.    . pravastatin (PRAVACHOL) 10 MG tablet Take 1 tablet (10 mg total) by mouth daily. 90 tablet 3  . Probiotic CAPS Take 1 capsule by mouth 2 (two) times daily with a meal.    . sitaGLIPtin (JANUVIA) 100 MG tablet Take 1 tablet (100 mg total) by mouth daily. 90 tablet 3  . traZODone (DESYREL) 150 MG tablet Take 150 mg by mouth at bedtime.    Marland Kitchen zolpidem (AMBIEN) 5 MG tablet Take one half tablet at night as needed for sleep 30 tablet 1   No current facility-administered medications on file prior to visit.     BP 118/64   Pulse 65   Temp 98.1 F (36.7 C) (Oral)   Resp 16   Ht '5\' 8"'$  (1.727 m)   Wt 180 lb (81.6 kg)   SpO2 96%   BMI 27.37 kg/m       Objective:   Physical Exam  General Appearance- Not in acute distress.    Chest and Lung Exam Auscultation: Breath sounds:-Normal. Clear even and unlabored. Adventitious sounds:- No Adventitious sounds.  Cardiovascular Auscultation:Rythm - Regular, rate and rythm. Heart Sounds -Normal heart sounds.  Abdomen Inspection:-Inspection Normal.  Palpation/Perucssion: Palpation and Percussion of the abdomen reveal- Non Tender, No Rebound tenderness, No rigidity(Guarding) and No Palpable abdominal masses.  Liver:-Normal.  Spleen:- Normal.    Back Mid lumbar spine tenderness to palpation. Pain on straight leg lift. Pain on lateral movements and flexion/extension of the spine.  Lower ext neurologic  L5-S1 sensation intact bilaterally. Normal patellar reflexes bilaterally. No foot drop bilaterally.      Assessment & Plan:  For your recent flare of chronic pain will rx norco low number and 5 day taper prednisone. Continue you muscle relaxant.(while using norco do not use any of your old prior oxycontin tabs)  While on prednisone continue your current diabetic meds and eat low sugar diet.  If your back pain worsens or changes please let us know.(watch for any red flag symptoms/signs)  Follow up in 7-10 days or as needed

## 2017-02-16 NOTE — Patient Instructions (Addendum)
For your recent flare of chronic pain will rx norco low number and 5 day taper prednisone. Continue you muscle relaxant.(while using norco do not use any of your old prior oxycontin tabs)  While on prednisone continue your current diabetic meds and eat low sugar diet.  If your back pain worsens or changes please let us know.(watch for any red flag symptoms/signs)  Follow up in 7-10 days or as needed

## 2017-02-18 MED FILL — MYRBETRIQ ER 50 MG TABLET: 50 | 30 days supply | Qty: 30 | Fill #0

## 2017-02-20 ENCOUNTER — Other Ambulatory Visit: Payer: Self-pay | Admitting: Emergency Medicine

## 2017-02-20 DIAGNOSIS — I10 Essential (primary) hypertension: Secondary | ICD-10-CM

## 2017-02-20 MED ORDER — METOPROLOL SUCCINATE ER 25 MG PO TB24: 25.0000 mg | ORAL_TABLET | Freq: Every morning | ORAL | 3 refills | Status: DC

## 2017-02-20 MED FILL — METOPROLOL SUCC ER 25 MG TA: 25 | 90 days supply | Qty: 90 | Fill #0

## 2017-02-23 ENCOUNTER — Other Ambulatory Visit: Payer: Self-pay | Admitting: Family Medicine

## 2017-02-23 DIAGNOSIS — E088 Diabetes mellitus due to underlying condition with unspecified complications: Secondary | ICD-10-CM

## 2017-02-23 MED FILL — PRAVASTATIN NA 10 MG TAB: 10 | 90 days supply | Qty: 90 | Fill #2

## 2017-02-23 MED FILL — LOSARTAN POTASSIUM 25 MG TA: 25 | 90 days supply | Qty: 180 | Fill #1

## 2017-02-23 MED FILL — metFORMIN HCL 1000 MG TABS: 1000 | 90 days supply | Qty: 180 | Fill #0

## 2017-02-23 NOTE — Telephone Encounter (Signed)
Last filled: 11/19/16 Amt: 180,0 Last OV: 12/24/16 Rx sent.

## 2017-02-24 DIAGNOSIS — E890 Postprocedural hypothyroidism: Secondary | ICD-10-CM | POA: Diagnosis not present

## 2017-02-24 DIAGNOSIS — C73 Malignant neoplasm of thyroid gland: Secondary | ICD-10-CM | POA: Diagnosis not present

## 2017-02-26 MED FILL — MONTELUKAST SOD 10 MG TAB: 10 | 90 days supply | Qty: 90 | Fill #0

## 2017-03-02 ENCOUNTER — Ambulatory Visit: Payer: Self-pay | Admitting: Family Medicine

## 2017-03-10 NOTE — Progress Notes (Deleted)
[place] at [facility] [address] [place], [place] [address] [phone]  Date:  03/11/2017   Name:  Debbie Hayes   DOB:  [birth year]   MRN:  [id number]  PCP:  Darreld Mclean, MD    Chief Complaint: No chief complaint on file.   History of Present Illness:  Debbie Hayes is a 66 y.o. very pleasant female patient who presents with the following:  History of controlled DM, hypothyroidism Here today with concern of back pain She did have an L4/5 decompression and fusion in 2016 per NSG, Dr. Lorenda Peck Admission Diagnoses: L4-5 spondylolisthesis, spinal stenosis, lumbago, facet arthropathy, lumbar radiculopathy, neurogenic claudication  Discharge Diagnoses: The same Active Problems:   Spondylolisthesis of lumbar region  Hospital Course: I performed an L4-5 decompression, instrumentation, and fusion on the patient on 08/09/2014. The surgery went well.   Lab Results  Component Value Date   HGBA1C 6.3 12/24/2016   Lab Results  Component Value Date   TSH 0.32 (L) 12/24/2016   We decreased her synthroid dose just slightly at last labs-   Patient Active Problem List   Diagnosis Date Noted  . Snoring 03/25/2016  . Palpitations 03/25/2016  . Sleep related headaches 03/25/2016  . Trigeminal anesthesia 11/26/2015  . Spondylolisthesis of lumbar region 08/09/2014  . Back pain 02/17/2014  . Macular degeneration 02/17/2014  . Other and unspecified hyperlipidemia 10/14/2013  . Onychomycosis 12/01/2012  . Diabetes mellitus ([place]) 04/12/2012  . Arthritis 04/12/2012  . Hypothyroid 04/12/2012    Past Medical History:  Diagnosis Date  . Allergy   . Anxiety   . Cataract   . Chronic migraine BOTOX INJECTION EVERY 3 MONTHS  . Chronic pain in right shoulder   . Constipation   . Diabetes mellitus   . Disorder of inner ear CAUSES VERTIGO OCCASIONALLY  . Fibromyalgia   . GERD (gastroesophageal reflux disease)   .  Hemorrhoid   . History of bladder infections   . History of thyroid cancer 2009  S/P TOTAL THYROIDECTOMY AND RADIATION  . Hyperlipidemia   . Hypertension CARDIOLOGIST- DR Wynonia Lawman- WILL REQUEST LATE NOTE   DENIES S & S  . Hypothyroidism   . Insomnia   . Left shoulder pain   . Macular degeneration   . Normal nuclear stress test 01-25-2009  . OA (osteoarthritis) JOINTS  . PONV (postoperative nausea and vomiting) SEVERE  . Rotator cuff disorder LEFT SHOULDER RTC IMPINGMENT  . Spondylolisthesis of lumbar region   . 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 Wears glasses     Past Surgical History:  Procedure Laterality Date  . BILATERAL CARPAL TUNNEL RELEASE  1994  . BILATERAL ELBOW SURG.  1999  . BILATERAL SALPINGOOPHECTOMY  1993   POST-OP URETER REPAIR 12 DAYS AFTER   . FOOT ARTHRODESIS Left 07/10/2016   Procedure: LEFT 2ND TARSAL METATARSAL ARTHRODESIS  GASTROC RECESSION LEFT LAPIDUS MODIFIED MCBRIDE BUIONECTOMY;  Surgeon: Wylene Simmer, MD;  Location: [place];  Service: Orthopedics;  Laterality: Left;  Marland Kitchen GASTROC RECESSION EXTREMITY Left 07/10/2016   Procedure: GASTROC RECESSION EXTREMITY;  Surgeon: Wylene Simmer, MD;  Location: [place];  Service: Orthopedics;  Laterality: Left;  . LEFT THUMB JOINT REPLACEMENT  2005   RIGHT DONE IN 2004  . RIGHT KNEE ARTHROSCOPY  X2  BEFORE 2011  . RIGHT KNEE ARTHROSCOPY/ PARTIAL LATERAL MENISECTOMY/ TRICOMPARTMENT CHONDROPLASTY/ DECOMPRESSION CYST  10-26-2009  . RIGHT KNEE CLOSED MANIPULATION  09-11-2010  . RIGHT SHOULDER ARTHROSCOPY  2010  &  2004  . TIBIA CYST REMOVED AND ORIF LEG FX  1958  . TONSILLECTOMY  1968  . TOTAL KNEE ARTHROPLASTY  07-16-2010   RIGHT  . TOTAL THYROIDECTOMY  2009   CANCER  (POST-OP BLEED)  AND RADIATION TX  . trigger fingers Right 2013   3rd and 4th fingers  . VAGINAL HYSTERECTOMY  1990    Social History  Substance Use Topics  . Smoking status: Never Smoker  . Smokeless tobacco:  Never Used  . Alcohol use Yes     Comment: RARE    Family History  Problem Relation Age of Onset  . Hayes polyps Sister 6  . Alzheimer's disease Mother   . Heart disease Father   . Cirrhosis Father   . Heart attack Father   . Macular degeneration Father   . Hayes cancer Neg Hx     Allergies  Allergen Reactions  . Ciprofloxacin Diarrhea  . Other Other (See Comments)    Artificial sweetners  . Sulfa Antibiotics Hives    Medication list has been reviewed and updated.  Current Outpatient Prescriptions on File Prior to Visit  Medication Sig Dispense Refill  . acetaminophen (TYLENOL) 500 MG tablet Take 500 mg by mouth every 6 (six) hours as needed.    Marland Kitchen aspirin EC 81 MG tablet Take 1 tablet (81 mg total) by mouth 2 (two) times daily. 84 tablet 0  . azelastine (ASTELIN) 0.1 % nasal spray PLACE 2 SPRAYS INTO BOTH NOSTRILS 2 TIMES DAILY AS DIRECTED 90 mL 0  . blood glucose meter kit and supplies KIT Test blood sugar once daily. Dx code: E11.9 1 each 0  . cetirizine (ZYRTEC) 10 MG tablet Take 10 mg by mouth daily as needed for allergies.     Marland Kitchen eletriptan (RELPAX) 20 MG tablet TAKE 1 TABLET BY MOUTH AS NEEDED FOR MIGRAINE OR HEADACHE. MAY REPEAT IN 2 HOURS IF HEADACHE PERSISTS OR RECURS. 9 tablet 4  . HYDROcodone-acetaminophen (NORCO) 5-325 MG tablet Take 1 tablet by mouth every 6 (six) hours as needed for moderate pain. 12 tablet 0  . levothyroxine (SYNTHROID, LEVOTHROID) 125 MCG tablet Take 1 tablet (125 mcg total) by mouth daily before breakfast. 30 tablet 5  . lidocaine (LIDODERM) 5 % Place 1 patch onto the skin as needed (for pain). Remove & Discard patch within 12 hours or as directed by MD    . losartan (COZAAR) 50 MG tablet Take 1 tablet by mouth daily.  3  . metaxalone (SKELAXIN) 800 MG tablet TAKE 1 TABLET BY MOUTH 3 TIMES DAILY. 90 tablet 2  . metFORMIN (GLUCOPHAGE) 1000 MG tablet TAKE 1 TABLET BY MOUTH 2 TIMES DAILY WITH A MEAL. 180 tablet 0  . metoprolol succinate  (TOPROL-XL) 25 MG 24 hr tablet Take 1 tablet (25 mg total) by mouth every morning. 90 tablet 3  . montelukast (SINGULAIR) 10 MG tablet TAKE 1 TABLET BY MOUTH AT BEDTIME. 90 tablet 3  . Multiple Vitamins-Minerals (ICAPS AREDS 2) CAPS Take by mouth.    Marland Kitchen NASONEX 50 MCG/ACT nasal spray PLACE 2 SPRAYS INTO THE NOSE DAILY. 17 g 9  . nitrofurantoin (MACRODANTIN) 50 MG capsule Take 50 mg by mouth at bedtime.     . NON FORMULARY     . OVER THE COUNTER MEDICATION Take 1 tablet by mouth 2 (two) times daily. Hemp Oil    . polyethylene glycol (MIRALAX / GLYCOLAX) packet Take 17 g by  mouth daily.    . pravastatin (PRAVACHOL) 10 MG tablet Take 1 tablet (10 mg total) by mouth daily. 90 tablet 3  . predniSONE (DELTASONE) 10 MG tablet 5 TAB PO DAY 1 4 TAB PO DAY 2 3 TAB PO DAY 3 2 TAB PO DAY 4 1 TAB PO DAY 5 15 tablet 0  . Probiotic CAPS Take 1 capsule by mouth 2 (two) times daily with a meal.    . sitaGLIPtin (JANUVIA) 100 MG tablet Take 1 tablet (100 mg total) by mouth daily. 90 tablet 3  . traZODone (DESYREL) 150 MG tablet Take 150 mg by mouth at bedtime.    Marland Kitchen zolpidem (AMBIEN) 5 MG tablet Take one half tablet at night as needed for sleep 30 tablet 1   No current facility-administered medications on file prior to visit.     Review of Systems:  As per HPI- otherwise negative.   Physical Examination: There were no vitals filed for this visit. There were no vitals filed for this visit. There is no height or weight on file to calculate BMI. Ideal Body Weight:    GEN: WDWN, NAD, Non-toxic, A & O x 3 HEENT: Atraumatic, Normocephalic. Neck supple. No masses, No LAD. Ears and Nose: No external deformity. CV: RRR, No M/G/R. No JVD. No thrill. No extra heart sounds. PULM: CTA B, no wheezes, crackles, rhonchi. No retractions. No resp. distress. No accessory muscle use. ABD: S, NT, ND, +BS. No rebound. No HSM. EXTR: No c/c/e NEURO Normal gait.  PSYCH: Normally interactive. Conversant. Not depressed or  anxious appearing.  Calm demeanor.    Assessment and Plan: ***  Signed Lamar Blinks, MD

## 2017-03-11 ENCOUNTER — Ambulatory Visit: Payer: Self-pay | Admitting: Family Medicine

## 2017-03-12 ENCOUNTER — Encounter: Payer: Self-pay | Admitting: Neurology

## 2017-03-12 ENCOUNTER — Ambulatory Visit (INDEPENDENT_AMBULATORY_CARE_PROVIDER_SITE_OTHER): Payer: 59 | Admitting: Neurology

## 2017-03-12 VITALS — BP 111/70 | HR 90 | Ht 68.0 in | Wt 178.0 lb

## 2017-03-12 DIAGNOSIS — G43711 Chronic migraine without aura, intractable, with status migrainosus: Secondary | ICD-10-CM

## 2017-03-12 NOTE — Patient Instructions (Signed)
[place]

## 2017-03-12 NOTE — Progress Notes (Signed)
Provider:  Larey Seat, M D  Referring Provider: Darreld Mclean, MD Primary Care Physician:  Darreld Mclean, MD  Chief Complaint  Patient presents with  . New Patient (Initial Visit)    pt has seen Dr Brett Fairy before and patient has been struggling with mirgraines, pt was seen in past for sleep study in which she was referred to dentist for a dental device to wear vs the CPAP. pt doesnt feel like in doing this treatment that is has helped with headaches     HPI:  Debbie Hayes is a 66 y.o. female , seen here as a referral from Dr. Lorelei Pont , now for migraine headache evaluation.   Debbie Hayes has been successfully reducing her AHI with the dental treatment guided by Dr. Toy Cookey, she is currently at an AHI of 13 this is the  5th adjustment for her straps- 7 today she got it she has wanted every night and she has not developed TMJ pain or clicks. In terms of apnea control I think this works well for her I hope that she will reach an AHI of 5 or below but there is already a significant reduction in comparison to her baseline.  She would like to have a new evaluation for migraines today. Headaches occur 17-20 times per month over the last 12 months. Headaches last multiple hours, some will improve without medication most will not, and on occasion there will be a 24-hour headache resulting. She [facility] Doctor Domingo Cocking, but her last Botox treatments did not work and shut so she stopped going about 4 years ago., She still has Imitrex for up to twice a week.  And she is allowed to take a Tylenol or nonsteroidal for 3 days a week. She is on Toprol, and still has headaches, failed Topiramate in the past,still needed zofran.  She has severe photophobia.  She has not been on Depakote, Zonegran. CBG treatment to be initiated.      Chief complaint according to patient :   The patient has experienced for the second time in 4 years left facial  dysesthesias this time on March 27, a feeling as if a liquid was dropping down the face from the left temple. This dysesthesia lasted for about 12 hours and returned every other day. She did not have motor weakness with it the patient has a history of steroid-induced diabetes, history of thyroid cancer, surgical hypothyroidism, gastroesophageal reflux disease, chronic migraine, fibromyalgia hyperlipidemia and TMJ syndrome. To treat TMJ, she wears a mouthguard at night she also has a history of bruxism which is prevented by the dental device. The emergency room medical records did indicate that the patient underwent an MRI of the brain which was entirely normal, that she had an EKG which was normal, and multiple laboratory tests. Medical history : 4 years ago the patient had dysesthesias at the left upper lip, returning for several months. About 19 years ago she had left-sided facial zoster. 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 She expects a new (fourth) grandchild to be born early 2018.  One daughter has Downs syndrome.  She has never used any kind of tobacco product, she rarely drinks wine, perhaps 1 glass every other month. She gave up caffeine 4 years ago when her migraines became chronic. She received Botox injections for the treatment of migraine.  Debbie Hayes reports that she has had a  recurrence of migraines which used to be treated with Botox injections. She is concerned that there may be a correlation to her sleep pattern. Usually she sleeps between midnight and 8 AM 8 hours each night. She was diagnosed with fibromyalgia in the early 1990s, and her sleep pattern changed with the fibromyalgia pain. Sleep was less restoring and very fragmented. Since retirement she takes usually at 20 minutes after lunch nap which allows her to stay alert and awake until midnight. The late hours of the day otherwise she can share with her husband.  She  reports that once she is in bed at midnight and she will instantly fall asleep. She will stay asleep throughout the night except for one bathroom break. She will use one pillow only, usually sleeps in supine position. The bedroom is cool, quiet and dark she shares a bed with her husband. For background noise the family will be running.no TV . Her husband uses CPAP. He noted her to snore, but he hasn't noted apnea, only some PLMs.   Last visit note CD:  Debbie Hayes, a pediatric nurse, was able to give me a very detailed description of the sensory abnormalities that led to her last emergency room visit. 4 years ago she had dysesthesias of the left face centered at the left upper lip now at a Zygomatic arch. She also took a couple of pictures showing that her left upper lip seemed to be slimmer than her right but there is no obliteration of the nasolabial fold. Based on this I do not think that this is a facial nerve involvement but the middle branch of the trigeminal nerve. 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 I like for her to continue her baby aspirin regimen, as she is diabetic I would not like to treat her with steroids, there is no report of taking acyclovir years after visible skin lesions have disappeared. I think it is likely that she will have intermittently every couple of years or so one of the spells. I would like for her to go to the emergency room only if she has a focal neurologic weakness change in her speech, swallowing or taste.     Review of Systems: Out of a complete 14 system review, the patient complains of only the following symptoms, and all other reviewed systems are negative.recurrent dyseasthesias of the left face- only in the middle branch. Which was affected by zoster. Snoring.   Social History   Social History  . Marital  status: Married    Spouse name: N/A  . Number of children: N/A  . Years of education: N/A   Occupational History  . Not on file.   Social History Main Topics  . Smoking status: Never Smoker  . Smokeless tobacco: Never Used  . Alcohol use Yes     Comment: RARE  . Drug use: No  . Sexual activity: Not on file   Other Topics Concern  . Not on file   Social History Narrative  . No narrative on file    Family History  Problem Relation Age of Onset  . Colon polyps Sister 76  . Alzheimer's disease Mother   . Heart disease Father   . Cirrhosis Father   . Heart attack Father   . Macular degeneration Father   . Colon cancer Neg Hx  Past Medical History:  Diagnosis Date  . Allergy   . Anxiety   . Cataract   . Chronic migraine BOTOX INJECTION EVERY 3 MONTHS  . Chronic pain in right shoulder   . Constipation   . Diabetes mellitus   . Disorder of inner ear CAUSES VERTIGO OCCASIONALLY  . Fibromyalgia   . GERD (gastroesophageal reflux disease)   . Hemorrhoid   . History of bladder infections   . History of thyroid cancer 2009  S/P TOTAL THYROIDECTOMY AND RADIATION  . Hyperlipidemia   . Hypertension CARDIOLOGIST- DR Wynonia Lawman- WILL REQUEST LATE NOTE   DENIES S & S  . Hypothyroidism   . Insomnia   . Left shoulder pain   . Macular degeneration   . Normal nuclear stress test 01-25-2009  . OA (osteoarthritis) JOINTS  . PONV (postoperative nausea and vomiting) SEVERE  . Rotator cuff disorder LEFT SHOULDER RTC IMPINGMENT  . Spondylolisthesis of lumbar region   . 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 Wears glasses     Past Surgical History:  Procedure Laterality Date  . BILATERAL CARPAL TUNNEL RELEASE  1994  . BILATERAL ELBOW SURG.  1999  . BILATERAL SALPINGOOPHECTOMY  1993   POST-OP URETER REPAIR 12 DAYS AFTER   . FOOT ARTHRODESIS Left 07/10/2016   Procedure: LEFT 2ND TARSAL METATARSAL ARTHRODESIS  GASTROC RECESSION LEFT LAPIDUS MODIFIED MCBRIDE BUIONECTOMY;   Surgeon: Wylene Simmer, MD;  Location: [facility];  Service: Orthopedics;  Laterality: Left;  Marland Kitchen GASTROC RECESSION EXTREMITY Left 07/10/2016   Procedure: GASTROC RECESSION EXTREMITY;  Surgeon: Wylene Simmer, MD;  Location: [facility];  Service: Orthopedics;  Laterality: Left;  . LEFT THUMB JOINT REPLACEMENT  2005   RIGHT DONE IN 2004  . RIGHT KNEE ARTHROSCOPY  X2  BEFORE 2011  . RIGHT KNEE ARTHROSCOPY/ PARTIAL LATERAL MENISECTOMY/ TRICOMPARTMENT CHONDROPLASTY/ DECOMPRESSION CYST  10-26-2009  . RIGHT KNEE CLOSED MANIPULATION  09-11-2010  . RIGHT SHOULDER ARTHROSCOPY  2010  &  2004  . TIBIA CYST REMOVED AND ORIF LEG FX  1958  . TONSILLECTOMY  1968  . TOTAL KNEE ARTHROPLASTY  07-16-2010   RIGHT  . TOTAL THYROIDECTOMY  2009   CANCER  (POST-OP BLEED)  AND RADIATION TX  . trigger fingers Right 2013   3rd and 4th fingers  . VAGINAL HYSTERECTOMY  1990    Current Outpatient Prescriptions  Medication Sig Dispense Refill  . acetaminophen (TYLENOL) 500 MG tablet Take 500 mg by mouth every 6 (six) hours as needed.    Marland Kitchen aspirin EC 81 MG tablet Take 1 tablet (81 mg total) by mouth 2 (two) times daily. 84 tablet 0  . azelastine (ASTELIN) 0.1 % nasal spray PLACE 2 SPRAYS INTO BOTH NOSTRILS 2 TIMES DAILY AS DIRECTED 90 mL 0  . blood glucose meter kit and supplies KIT Test blood sugar once daily. Dx code: E11.9 1 each 0  . cetirizine (ZYRTEC) 10 MG tablet Take 10 mg by mouth daily as needed for allergies.     Marland Kitchen eletriptan (RELPAX) 20 MG tablet TAKE 1 TABLET BY MOUTH AS NEEDED FOR MIGRAINE OR HEADACHE. MAY REPEAT IN 2 HOURS IF HEADACHE PERSISTS OR RECURS. 9 tablet 4  . HYDROcodone-acetaminophen (NORCO) 5-325 MG tablet Take 1 tablet by mouth every 6 (six) hours as needed for moderate pain. 12 tablet 0  . levothyroxine (SYNTHROID, LEVOTHROID) 125 MCG tablet Take 1 tablet (125 mcg total) by mouth daily before breakfast. 30 tablet 5  . lidocaine (LIDODERM) 5 %  Place 1 patch onto the  skin as needed (for pain). Remove & Discard patch within 12 hours or as directed by MD    . losartan (COZAAR) 50 MG tablet Take 1 tablet by mouth daily.  3  . metaxalone (SKELAXIN) 800 MG tablet TAKE 1 TABLET BY MOUTH 3 TIMES DAILY. 90 tablet 2  . metFORMIN (GLUCOPHAGE) 1000 MG tablet TAKE 1 TABLET BY MOUTH 2 TIMES DAILY WITH A MEAL. 180 tablet 0  . metoprolol succinate (TOPROL-XL) 25 MG 24 hr tablet Take 1 tablet (25 mg total) by mouth every morning. 90 tablet 3  . montelukast (SINGULAIR) 10 MG tablet TAKE 1 TABLET BY MOUTH AT BEDTIME. 90 tablet 3  . Multiple Vitamins-Minerals (ICAPS AREDS 2) CAPS Take by mouth.    Marland Kitchen NASONEX 50 MCG/ACT nasal spray PLACE 2 SPRAYS INTO THE NOSE DAILY. 17 g 9  . nitrofurantoin (MACRODANTIN) 50 MG capsule Take 50 mg by mouth at bedtime.     . NON FORMULARY     . OVER THE COUNTER MEDICATION Take 1 tablet by mouth 2 (two) times daily. Hemp Oil    . polyethylene glycol (MIRALAX / GLYCOLAX) packet Take 17 g by mouth daily.    . pravastatin (PRAVACHOL) 10 MG tablet Take 1 tablet (10 mg total) by mouth daily. 90 tablet 3  . Probiotic CAPS Take 1 capsule by mouth 2 (two) times daily with a meal.    . sitaGLIPtin (JANUVIA) 100 MG tablet Take 1 tablet (100 mg total) by mouth daily. 90 tablet 3  . traZODone (DESYREL) 150 MG tablet Take 150 mg by mouth at bedtime.    Marland Kitchen zolpidem (AMBIEN) 5 MG tablet Take one half tablet at night as needed for sleep 30 tablet 1  . MYRBETRIQ 50 MG TB24 tablet   11   No current facility-administered medications for this visit.     Allergies as of 03/12/2017 - Review Complete 03/12/2017  Allergen Reaction Noted  . Ciprofloxacin Diarrhea 07/10/2016  . Other Other (See Comments) 08/01/2014  . Sulfa antibiotics Hives 09/05/2011    Vitals: BP 111/70   Pulse 90   Ht '5\' 8"'$  (1.727 m)   Wt 178 lb (80.7 kg)   BMI 27.06 kg/m  Last Weight:  Wt Readings from Last 1 Encounters:  03/12/17 178 lb (80.7 kg)   MHD:QQIW mass index is 27.06 kg/m.      Last Height:   Ht Readings from Last 1 Encounters:  03/12/17 '5\' 8"'$  (1.727 m)    Physical exam:  General: The patient is awake, alert and appears not in acute distress. The patient is well groomed. Neck circumference is 14.75 inches, Mallampati grade 2 without lateral restriction. The soft palate is slightly reddened and puffy,attributed to allergic rhinitis.  Allergic rhinitis has been noted almost year around. Head: Normocephalic, atraumatic. Neck is supple.  Cardiovascular:  Regular rate and rhythm, without  murmurs or carotid bruit, and without distended neck veins. Respiratory: Lungs are clear to auscultation. Skin:  Without evidence of edema, or rash Trunk: BMI is elevated. The patient's posture is erect.  Neurologic exam : The patient is awake and alert, oriented to place and time.   Memory subjective described as intact.  Attention span & concentration ability appears normal.  Speech is fluent, without dysarthria, mild  dysphonia and no aphasia.  Mood and affect are appropriate.  Cranial nerves: Pupils are equal and briskly reactive to light. Funduscopic exam without  evidence of pallor or edema. Extraocular movements  in  vertical and horizontal planes intact and without nystagmus. Visual fields by finger perimetry are intact.Hearing to finger rub intact.   Facial sensation the patient describes a numb spot lateral to the left outer angle of the eye and radiating over the zygomatic arch. Never cross the midline. 4 years ago she had numbness on the left upper lip. motor strength is symmetric and tongue and uvula move midline. Shoulder shrug was symmetrical.   Motor exam:  Normal tone, muscle bulk and symmetric strength in all extremities. Sensory:  Fine touch, pinprick and vibration were tested in all extremities. Proprioception tested in the upper extremities was normal. Coordination: Rapid alternating movements in the fingers/hands was normal. Finger-to-nose maneuver  normal  without evidence of ataxia, dysmetria or tremor. Gait and station: Patient walks without assistive device and is able unassisted to climb up to the exam table. Strength within normal limits. Stance is stable and normal.   Deep tendon reflexes: in the  upper and lower extremities are symmetric and intact. Babinski maneuver response is downgoing.   I spent more than 25 minutes of face to face time with the patient. Greater than 50% of time was spent in counseling and coordination of care. We have discussed the diagnosis and differential and I answered the patient's questions.    Debbie Hayes has no focal neurologic deficits, we are discussing today the effects of her dental sleep apnea treatments. She was able to reduce from a moderate severe apnea to a mild apnea degree and is still in the process of further titration. I hope that this will help have benefited not just on daytime alertness and nocturnal sleep quality but raises her threshold for migraines as well. For now she has frequent migraines between 16 and 20 times a month, responding only temporarily to triptan, or NSAIDs she uses very sparingly. I would like for her to start an injection therapy for migraine.  We gave a tutorial for the use of auto injector, Medication chosen is AIMOVIG. Monthly use. She signed up for 4 free treatments.    Assessment:  After physical and neurologic examination, review of laboratory studies,  Personal review of imaging studies, reports of other /same  Imaging studies -  Results of  pre-existing records as far as provided in visit.,  And her recent dental sleep records. my assessment is   I will order for  Debbie Hayes  an injectible medication , if permitted by her insurance carrier.  Since she has sleep related headaches and chronic migraines, has failed multiple treatments and prophylactic medication.       Asencion Partridge Debbie Fargo MD  03/12/2017   CC: Darreld Mclean, Md [address]  [place], [place] [address]

## 2017-03-13 MED FILL — OLOPATADINE HCL 0.2 % SOLN: 0.2 | 25 days supply | Qty: 3 | Fill #2

## 2017-03-13 MED FILL — NITROFURANTOIN MCR 50 MG CA: 50 | 30 days supply | Qty: 30 | Fill #1

## 2017-03-13 MED FILL — LEVOTHYROXINE 125 MCG TABLE: 125 | 30 days supply | Qty: 30 | Fill #2

## 2017-03-13 MED FILL — JANUVIA 100 MG TABLET: 100 | 30 days supply | Qty: 30 | Fill #6

## 2017-03-13 MED FILL — ELETRIPTAN HBR 20 MG TABLET: 20 | 30 days supply | Qty: 9 | Fill #4

## 2017-04-06 ENCOUNTER — Ambulatory Visit (INDEPENDENT_AMBULATORY_CARE_PROVIDER_SITE_OTHER): Payer: 59 | Admitting: Ophthalmology

## 2017-04-07 ENCOUNTER — Encounter: Payer: Self-pay | Admitting: Neurology

## 2017-04-08 ENCOUNTER — Other Ambulatory Visit: Payer: Self-pay | Admitting: Neurology

## 2017-04-13 MED FILL — OLOPATADINE HCL 0.2 % SOLN: 0.2 | 25 days supply | Qty: 3 | Fill #3

## 2017-04-13 MED FILL — LEVOTHYROXINE 125 MCG TABLE: 125 | 90 days supply | Qty: 90 | Fill #3

## 2017-04-13 MED FILL — NITROFURANTOIN MCR 50 MG CA: 50 | 90 days supply | Qty: 90 | Fill #2

## 2017-04-13 MED FILL — JANUVIA 100 MG TABLET: 100 | 30 days supply | Qty: 30 | Fill #7

## 2017-04-13 MED FILL — MYRBETRIQ ER 50 MG TABLET: 50 | 30 days supply | Qty: 30 | Fill #1

## 2017-04-14 ENCOUNTER — Telehealth: Payer: Self-pay | Admitting: Family Medicine

## 2017-04-14 MED ORDER — ALBUTEROL SULFATE HFA 108 (90 BASE) MCG/ACT IN AERS: 2.0000 | INHALATION_SPRAY | Freq: Four times a day (QID) | RESPIRATORY_TRACT | 2 refills | Status: DC | PRN

## 2017-04-14 MED FILL — VENTOLIN HFA 90 MCG INHALER: 108 (90 BAS | 25 days supply | Qty: 18 | Fill #0

## 2017-04-14 NOTE — Telephone Encounter (Signed)
Pt contacted me- she needs a refill of her albuterol inhaler.  Will fill this for her

## 2017-04-20 ENCOUNTER — Ambulatory Visit (INDEPENDENT_AMBULATORY_CARE_PROVIDER_SITE_OTHER): Payer: 59 | Admitting: Ophthalmology

## 2017-04-20 DIAGNOSIS — H35033 Hypertensive retinopathy, bilateral: Secondary | ICD-10-CM | POA: Diagnosis not present

## 2017-04-20 DIAGNOSIS — H2513 Age-related nuclear cataract, bilateral: Secondary | ICD-10-CM

## 2017-04-20 DIAGNOSIS — H43813 Vitreous degeneration, bilateral: Secondary | ICD-10-CM

## 2017-04-20 DIAGNOSIS — I10 Essential (primary) hypertension: Secondary | ICD-10-CM

## 2017-04-20 DIAGNOSIS — H353132 Nonexudative age-related macular degeneration, bilateral, intermediate dry stage: Secondary | ICD-10-CM

## 2017-04-23 ENCOUNTER — Encounter: Payer: Self-pay | Admitting: Neurology

## 2017-04-23 ENCOUNTER — Other Ambulatory Visit: Payer: Self-pay

## 2017-04-23 MED ORDER — METAXALONE 800 MG PO TABS: 800.0000 mg | ORAL_TABLET | Freq: Three times a day (TID) | ORAL | 0 refills | Status: DC

## 2017-04-23 MED FILL — traZODone HCL 100 MG TABS: 100 | 90 days supply | Qty: 225 | Fill #2

## 2017-04-23 MED FILL — ELETRIPTAN HBR 20 MG TABLET: 20 | 30 days supply | Qty: 9 | Fill #0

## 2017-04-23 NOTE — Telephone Encounter (Signed)
Faxed 30d of Metaxalone 800mg  till OV/thx dmf

## 2017-04-27 ENCOUNTER — Encounter: Payer: Self-pay | Admitting: Family Medicine

## 2017-04-27 ENCOUNTER — Ambulatory Visit (INDEPENDENT_AMBULATORY_CARE_PROVIDER_SITE_OTHER): Payer: 59 | Admitting: Family Medicine

## 2017-04-27 VITALS — BP 116/72 | HR 74 | Temp 98.7°F | Ht 68.0 in | Wt 180.5 lb

## 2017-04-27 DIAGNOSIS — J209 Acute bronchitis, unspecified: Secondary | ICD-10-CM

## 2017-04-27 DIAGNOSIS — E039 Hypothyroidism, unspecified: Secondary | ICD-10-CM

## 2017-04-27 DIAGNOSIS — Z23 Encounter for immunization: Secondary | ICD-10-CM | POA: Diagnosis not present

## 2017-04-27 MED ORDER — BENZONATATE 100 MG PO CAPS: 100.0000 mg | ORAL_CAPSULE | Freq: Three times a day (TID) | ORAL | 0 refills | Status: DC | PRN

## 2017-04-27 MED FILL — BENZONATATE 100 MG CAP: 100 | 10 days supply | Qty: 30 | Fill #0

## 2017-04-27 NOTE — Progress Notes (Signed)
Chief Complaint  Patient presents with  . Cough    [name] here for URI complaints.  Duration: 15 days  Associated symptoms: none Denies: sinus headache, sinus congestion, sinus pain, rhinorrhea, itchy watery eyes, ear pain, ear drainage, sore throat, wheezing, shortness of breath, myalgia and fevers/rigors Treatment to date: Albuterol, robitussin Sick contacts: Yes- granddaughter  ROS:  Const: Denies fevers HEENT: As noted in HPI Lungs: No SOB  Past Medical History:  Diagnosis Date  . Allergy   . Anxiety   . Cataract   . Chronic migraine BOTOX INJECTION EVERY 3 MONTHS  . Chronic pain in right shoulder   . Constipation   . Diabetes mellitus   . Disorder of inner ear CAUSES VERTIGO OCCASIONALLY  . Fibromyalgia   . GERD (gastroesophageal reflux disease)   . Hemorrhoid   . History of bladder infections   . History of thyroid cancer 2009  S/P TOTAL THYROIDECTOMY AND RADIATION  . Hyperlipidemia   . Hypertension CARDIOLOGIST- DR Wynonia Lawman- WILL REQUEST LATE NOTE   DENIES S & S  . Hypothyroidism   . Insomnia   . Left shoulder pain   . Macular degeneration   . Normal nuclear stress test 01-25-2009  . OA (osteoarthritis) JOINTS  . PONV (postoperative nausea and vomiting) SEVERE  . Rotator cuff disorder LEFT SHOULDER RTC IMPINGMENT  . Spondylolisthesis of lumbar region   . 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 Wears glasses    Family History  Problem Relation Age of Onset  . Colon polyps Sister 54  . Alzheimer's disease Mother   . Heart disease Father   . Cirrhosis Father   . Heart attack Father   . Macular degeneration Father   . Colon cancer Neg Hx     BP 116/72 (BP Location: Right Arm, Patient Position: Sitting, Cuff Size: Normal)   Pulse 74   Temp 98.7 F (37.1 C) (Oral)   Ht 5\' 8"  (1.727 m)   Wt 180 lb 8 oz (81.9 kg)   SpO2 96%   BMI 27.44 kg/m  General: Awake, alert, appears stated age HEENT: AT, [place], ears patent b/l and TM's neg, nares  patent w/o discharge, pharynx pink and without exudates, MMM Neck: No masses or asymmetry Heart: RRR, no murmurs, no bruits Lungs: CTAB, no accessory muscle use Psych: Age appropriate judgment and insight, normal mood and affect  Acute bronchitis, unspecified organism  Hypothyroidism, unspecified type  Acquired hypothyroidism - Plan: TSH  Need for influenza vaccination - Plan: Flu vaccine HIGH DOSE PF (Fluzone High dose)  Orders as above. Pocket rx given for zpak, take if still having issues by the end of week 3. Lungs sound clear today.  Continue to push fluids, practice good hand hygiene, cover mouth when coughing. F/u prn. If starting to experience fevers, shaking, or shortness of breath, seek immediate care. Pt voiced understanding and agreement to the plan.  Hugo, DO 04/27/17 3:26 PM

## 2017-04-27 NOTE — Patient Instructions (Signed)
Continue to push fluids, practice good hand hygiene, and cover your mouth if you cough.  If you start having fevers, shaking or shortness of breath, seek immediate care.  If you are not better at the end of week 3, take antibiotic.   Let us know if you need anything.

## 2017-04-28 ENCOUNTER — Telehealth: Payer: Self-pay | Admitting: Neurology

## 2017-04-28 LAB — TSH: TSH: 1.79 u[IU]/mL (ref 0.35–4.50)

## 2017-04-28 NOTE — Telephone Encounter (Signed)
Patient calling to see if she can come in for an injection of Aimovig.

## 2017-04-29 ENCOUNTER — Other Ambulatory Visit: Payer: Self-pay | Admitting: Neurology

## 2017-04-29 MED ORDER — ERENUMAB-AOOE 70 MG/ML ~~LOC~~ SOAJ: 140.0000 mg | SUBCUTANEOUS | 11 refills | Status: DC

## 2017-04-29 NOTE — Telephone Encounter (Signed)
Called patient. No answer. Pt can just come by for her sample when she is ready. Please tell the patient to ask for me when she comes

## 2017-04-30 ENCOUNTER — Telehealth: Payer: Self-pay | Admitting: Neurology

## 2017-04-30 NOTE — Telephone Encounter (Signed)
Sent PA to [facility] for [place]. May hear back up to 5  Days. DUP:[id number]

## 2017-05-01 ENCOUNTER — Other Ambulatory Visit: Payer: Self-pay | Admitting: Neurology

## 2017-05-01 MED ORDER — ERENUMAB-AOOE 70 MG/ML ~~LOC~~ SOAJ: 70.0000 mg | SUBCUTANEOUS | 11 refills | Status: DC

## 2017-05-04 ENCOUNTER — Encounter: Payer: Self-pay | Admitting: Family Medicine

## 2017-05-04 DIAGNOSIS — J209 Acute bronchitis, unspecified: Secondary | ICD-10-CM

## 2017-05-05 ENCOUNTER — Encounter: Payer: Self-pay | Admitting: Neurology

## 2017-05-05 MED ORDER — BENZONATATE 100 MG PO CAPS: 100.0000 mg | ORAL_CAPSULE | Freq: Three times a day (TID) | ORAL | 0 refills | Status: DC | PRN

## 2017-05-05 MED FILL — BENZONATATE 100 MG CAP: 100 | 10 days supply | Qty: 30 | Fill #0

## 2017-05-05 NOTE — Telephone Encounter (Signed)
PA approved for Aimovig from 05/04/2017-11/01/2017

## 2017-05-06 MED FILL — AIMOVIG 140 DOSE 70 MG/ML S: 70 | 30 days supply | Qty: 2 | Fill #0

## 2017-05-06 MED FILL — VENTOLIN HFA 90 MCG INHALER: 108 (90 BAS | 25 days supply | Qty: 18 | Fill #1

## 2017-05-06 MED FILL — OLOPATADINE HCL 0.2 % SOLN: 0.2 | 25 days supply | Qty: 3 | Fill #4

## 2017-05-07 MED FILL — JANUVIA 100 MG TABLET: 100 | 30 days supply | Qty: 30 | Fill #8

## 2017-05-07 MED FILL — MYRBETRIQ ER 50 MG TABLET: 50 | 90 days supply | Qty: 90 | Fill #2

## 2017-05-11 ENCOUNTER — Ambulatory Visit (HOSPITAL_BASED_OUTPATIENT_CLINIC_OR_DEPARTMENT_OTHER)
Admission: RE | Admit: 2017-05-11 | Discharge: 2017-05-11 | Disposition: A | Discharge status: Home / Self Care | Payer: 59 | Source: Ambulatory Visit | Attending: Family Medicine | Admitting: Family Medicine

## 2017-05-11 ENCOUNTER — Other Ambulatory Visit: Payer: Self-pay | Admitting: Family Medicine

## 2017-05-11 ENCOUNTER — Ambulatory Visit (INDEPENDENT_AMBULATORY_CARE_PROVIDER_SITE_OTHER): Payer: 59 | Admitting: Family Medicine

## 2017-05-11 ENCOUNTER — Encounter: Payer: Self-pay | Admitting: Family Medicine

## 2017-05-11 VITALS — BP 108/68 | HR 81 | Temp 98.3°F | Ht 68.0 in | Wt 181.2 lb

## 2017-05-11 DIAGNOSIS — R05 Cough: Secondary | ICD-10-CM | POA: Diagnosis not present

## 2017-05-11 DIAGNOSIS — E088 Diabetes mellitus due to underlying condition with unspecified complications: Secondary | ICD-10-CM

## 2017-05-11 DIAGNOSIS — R059 Cough, unspecified: Secondary | ICD-10-CM

## 2017-05-11 MED ORDER — MOMETASONE FUROATE 110 MCG/INH IN AEPB: INHALATION_SPRAY | RESPIRATORY_TRACT | 1 refills | Status: DC

## 2017-05-11 MED ORDER — LOSARTAN POTASSIUM 50 MG PO TABS: 50.0000 mg | ORAL_TABLET | Freq: Every day | ORAL | 1 refills | Status: DC

## 2017-05-11 MED ORDER — BENZONATATE 100 MG PO CAPS: 100.0000 mg | ORAL_CAPSULE | Freq: Three times a day (TID) | ORAL | 1 refills | Status: DC | PRN

## 2017-05-11 MED FILL — LOSARTAN POTASSIUM 50 MG TA: 50 | 90 days supply | Qty: 90 | Fill #0

## 2017-05-11 MED FILL — METOPROLOL SUCC ER 25 MG TA: 25 | 90 days supply | Qty: 90 | Fill #1

## 2017-05-11 MED FILL — PRAVASTATIN NA 10 MG TAB: 10 | 90 days supply | Qty: 90 | Fill #3

## 2017-05-11 NOTE — Progress Notes (Signed)
Chief Complaint  Patient presents with  . Cough    Debbie Hayes is 66 y.o. and is here for a cough.  Duration: 4 weeks Productive? No Associated symptoms: none Denies: fever, night sweats, nasal congestion, rhinorrhea, sore throat, facial pain, swollen glands, chest pain, hemoptysis, dyspnea, wheezing, ear pain/drainage, eye complaints  Hx of GERD? Yes ACEi? No  Has tried/failed Zpak, Albuterol Tessalon Perles helpful.   ROS:  Resp: +Cough Cardio: No chest pain  Past Medical History:  Diagnosis Date  . Allergy   . Anxiety   . Cataract   . Chronic migraine BOTOX INJECTION EVERY 3 MONTHS  . Chronic pain in right shoulder   . Constipation   . Diabetes mellitus   . Disorder of inner ear CAUSES VERTIGO OCCASIONALLY  . Fibromyalgia   . GERD (gastroesophageal reflux disease)   . Hemorrhoid   . History of bladder infections   . History of thyroid cancer 2009  S/P TOTAL THYROIDECTOMY AND RADIATION  . Hyperlipidemia   . Hypertension CARDIOLOGIST- DR Wynonia Lawman- WILL REQUEST LATE NOTE   DENIES S & S  . Hypothyroidism   . Insomnia   . Left shoulder pain   . Macular degeneration   . Normal nuclear stress test 01-25-2009  . OA (osteoarthritis) JOINTS  . PONV (postoperative nausea and vomiting) SEVERE  . Rotator cuff disorder LEFT SHOULDER RTC IMPINGMENT  . Spondylolisthesis of lumbar region   . 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 Wears glasses    Family History  Problem Relation Age of Onset  . Colon polyps Sister 77  . Alzheimer's disease Mother   . Heart disease Father   . Cirrhosis Father   . Heart attack Father   . Macular degeneration Father   . Colon cancer Neg Hx    Allergies as of 05/11/2017      Reactions   Ciprofloxacin Diarrhea   Other Other (See Comments)   Artificial sweetners   Sulfa Antibiotics Hives      Medication List       Accurate as of 05/11/17 10:30 AM. Always use your most recent med list.          acetaminophen 500 MG  tablet Commonly known as:  TYLENOL Take 500 mg by mouth every 6 (six) hours as needed.   albuterol 108 (90 Base) MCG/ACT inhaler Commonly known as:  PROVENTIL HFA;VENTOLIN HFA Inhale 2 puffs into the lungs every 6 (six) hours as needed for wheezing or shortness of breath.   azelastine 0.1 % nasal spray Commonly known as:  ASTELIN PLACE 2 SPRAYS INTO BOTH NOSTRILS 2 TIMES DAILY AS DIRECTED   benzonatate 100 MG capsule Commonly known as:  TESSALON Take 1 capsule (100 mg total) by mouth 3 (three) times daily as needed.   blood glucose meter kit and supplies Kit Test blood sugar once daily. Dx code: E11.9   cetirizine 10 MG tablet Commonly known as:  ZYRTEC Take 10 mg by mouth daily as needed for allergies.   eletriptan 20 MG tablet Commonly known as:  RELPAX TAKE 1 TABLET BY MOUTH AS NEEDED FOR MIGRAINE OR HEADACHE. MAY REPEAT IN 2 HOURS IF HEADACHE PERSISTS OR RECURS.   Erenumab-aooe 70 MG/ML Soaj Commonly known as:  AIMOVIG Inject 70 mg into the skin every 30 (thirty) days.   ICAPS AREDS 2 Caps Take by mouth.   levothyroxine 125 MCG tablet Commonly known as:  SYNTHROID, LEVOTHROID Take 1 tablet (125 mcg total) by mouth daily before breakfast.  lidocaine 5 % Commonly known as:  LIDODERM Place 1 patch onto the skin as needed (for pain). Remove & Discard patch within 12 hours or as directed by MD   losartan 50 MG tablet Commonly known as:  COZAAR Take 1 tablet (50 mg total) by mouth daily.   metaxalone 800 MG tablet Commonly known as:  SKELAXIN Take 1 tablet (800 mg total) by mouth 3 (three) times daily.   metFORMIN 1000 MG tablet Commonly known as:  GLUCOPHAGE TAKE 1 TABLET BY MOUTH 2 TIMES DAILY WITH A MEAL.   metoprolol succinate 25 MG 24 hr tablet Commonly known as:  TOPROL-XL Take 1 tablet (25 mg total) by mouth every morning.   Mometasone Furoate 110 MCG/INH Aepb 2 puffs twice daily. Rinse mouth out after each use.   montelukast 10 MG tablet Commonly  known as:  SINGULAIR TAKE 1 TABLET BY MOUTH AT BEDTIME.   MYRBETRIQ 50 MG Tb24 tablet Generic drug:  mirabegron ER   NASONEX 50 MCG/ACT nasal spray Generic drug:  mometasone PLACE 2 SPRAYS INTO THE NOSE DAILY.   nitrofurantoin 50 MG capsule Commonly known as:  MACRODANTIN Take 50 mg by mouth at bedtime.   NON FORMULARY   OVER THE COUNTER MEDICATION Take 1 tablet by mouth 2 (two) times daily. Hemp Oil   polyethylene glycol packet Commonly known as:  MIRALAX / GLYCOLAX Take 17 g by mouth daily.   pravastatin 10 MG tablet Commonly known as:  PRAVACHOL Take 1 tablet (10 mg total) by mouth daily.   Probiotic Caps Take 1 capsule by mouth 2 (two) times daily with a meal.   sitaGLIPtin 100 MG tablet Commonly known as:  JANUVIA Take 1 tablet (100 mg total) by mouth daily.   traZODone 150 MG tablet Commonly known as:  DESYREL Take 200 mg by mouth at bedtime.   zolpidem 5 MG tablet Commonly known as:  AMBIEN Take one half tablet at night as needed for sleep       BP 108/68 (BP Location: Left Arm, Patient Position: Sitting, Cuff Size: Normal)   Pulse 81   Temp 98.3 F (36.8 C) (Oral)   Ht '5\' 8"'$  (1.727 m)   Wt 181 lb 4 oz (82.2 kg)   SpO2 94%   BMI 27.56 kg/m  Gen: Awake, alert, appears stated age HEENT: Ears neg, nares patent without D/C, turbinates unremarkable, Pharynx pink without exudate Neck: Supple, no masses or asymmetry, no tenderness Heart: RRR, no murmurs, no LE edema Lungs: CTAB, normal effort, no accessory muscle use Psych: Age appropriate judgement and insight, normal mood and affect  Cough - Plan: benzonatate (TESSALON) 100 MG capsule, Mometasone Furoate 110 MCG/INH AEPB, DG Chest 2 View  Orders as above. Restart ICS, CXR to r/o sinister process vs infiltrate. Send MyChart message if no improvement, can try PO steroid. Given no wheezing, question efficacy? Does not feel like her GERD. No globus sensation. If no improvement after 4 weeks, will obtain  PFT's vs refer to pulm/ENT. She never had URI symptoms, though was exposed to sick granddaughter- question post-infectious cough.  F/u in 4 weeks if symptoms fail to improve. The patient voiced understanding and agreement to the plan.  [place], DO 05/11/17 10:30 AM

## 2017-05-11 NOTE — Progress Notes (Signed)
Pre visit review using our clinic review tool, if applicable. No additional management support is needed unless otherwise documented below in the visit note. 

## 2017-05-11 NOTE — Patient Instructions (Signed)
I will let you know the results of your X-ray by tomorrow morning.  Remember to rinse your mouth out after use of your inhaler.  Send me a MyChart message by the end of next week if we are not noticing any improvement.   Let us know if you need anything.

## 2017-05-12 ENCOUNTER — Other Ambulatory Visit: Payer: Self-pay | Admitting: Emergency Medicine

## 2017-05-13 ENCOUNTER — Encounter: Payer: Self-pay | Admitting: Family Medicine

## 2017-05-13 MED FILL — BENZONATATE 100 MG CAP: 100 | 10 days supply | Qty: 30 | Fill #0

## 2017-05-19 NOTE — Progress Notes (Deleted)
[place] [facility] [address] [place], [place] [address] [phone]  Date:  05/20/2017   Name:  Debbie Hayes   DOB:  [birth year]   MRN:  [id number]  PCP:  Darreld Mclean, MD    Chief Complaint: No chief complaint on file.   History of Present Illness:  Debbie Hayes is a 66 y.o. very pleasant female patient who presents with the following:  History of DM, hypothyroidism Here today for a follow-up visit-   Seen on 10/15 by Dr. Viona Gilmore Cough - Plan: benzonatate (TESSALON) 100 MG capsule, Mometasone Furoate 110 MCG/INH AEPB, DG Chest 2 View  Orders as above. Restart ICS, CXR to r/o sinister process vs infiltrate. Send MyChart message if no improvement, can try PO steroid. Given no wheezing, question efficacy? Does not feel like her GERD. No globus sensation. If no improvement after 4 weeks, will obtain PFT's vs refer to pulm/ENT. She never had URI symptoms, though was exposed to sick granddaughter- question post-infectious cough.  F/u in 4 weeks if symptoms fail to improve. The patient voiced understanding and agreement to the plan.  Dg Chest 2 View  Result Date: 05/11/2017 CLINICAL DATA:  Chronic cough for several weeks EXAM: CHEST  2 VIEW COMPARISON:  01/03/2015 FINDINGS: Cardiac shadow is stable. Aortic calcifications are again seen. The lungs are well-aerated without focal infiltrate or sizable effusion. Degenerative changes of the thoracic spine are noted. IMPRESSION: No active cardiopulmonary disease. Electronically Signed   By: Inez Catalina M.D.   On: 05/11/2017 10:08   Lab Results  Component Value Date   HGBA1C 6.3 12/24/2016     Patient Active Problem List   Diagnosis Date Noted  . Snoring 03/25/2016  . Palpitations 03/25/2016  . Sleep related headaches 03/25/2016  . Trigeminal anesthesia 11/26/2015  . Spondylolisthesis of lumbar region 08/09/2014  . Back pain 02/17/2014  . Macular degeneration  02/17/2014  . Other and unspecified hyperlipidemia 10/14/2013  . Onychomycosis 12/01/2012  . Diabetes mellitus ([place]) 04/12/2012  . Arthritis 04/12/2012  . Hypothyroid 04/12/2012    Past Medical History:  Diagnosis Date  . Allergy   . Anxiety   . Cataract   . Chronic migraine BOTOX INJECTION EVERY 3 MONTHS  . Chronic pain in right shoulder   . Constipation   . Diabetes mellitus   . Disorder of inner ear CAUSES VERTIGO OCCASIONALLY  . Fibromyalgia   . GERD (gastroesophageal reflux disease)   . Hemorrhoid   . History of bladder infections   . History of thyroid cancer 2009  S/P TOTAL THYROIDECTOMY AND RADIATION  . Hyperlipidemia   . Hypertension CARDIOLOGIST- DR Wynonia Lawman- WILL REQUEST LATE NOTE   DENIES S & S  . Hypothyroidism   . Insomnia   . Left shoulder pain   . Macular degeneration   . Normal nuclear stress test 01-25-2009  . OA (osteoarthritis) JOINTS  . PONV (postoperative nausea and vomiting) SEVERE  . Rotator cuff disorder LEFT SHOULDER RTC IMPINGMENT  . Spondylolisthesis of lumbar region   . 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 Wears glasses     Past Surgical History:  Procedure Laterality Date  . BILATERAL CARPAL TUNNEL RELEASE  1994  . BILATERAL ELBOW SURG.  1999  . BILATERAL SALPINGOOPHECTOMY  1993   POST-OP URETER REPAIR 12 DAYS AFTER   . FOOT ARTHRODESIS Left 07/10/2016   Procedure: LEFT 2ND TARSAL METATARSAL ARTHRODESIS  GASTROC RECESSION LEFT LAPIDUS  MODIFIED MCBRIDE BUIONECTOMY;  Surgeon: Wylene Simmer, MD;  Location: [facility];  Service: Orthopedics;  Laterality: Left;  Marland Kitchen GASTROC RECESSION EXTREMITY Left 07/10/2016   Procedure: GASTROC RECESSION EXTREMITY;  Surgeon: Wylene Simmer, MD;  Location: [facility];  Service: Orthopedics;  Laterality: Left;  . LEFT THUMB JOINT REPLACEMENT  2005   RIGHT DONE IN 2004  . RIGHT KNEE ARTHROSCOPY  X2  BEFORE 2011  . RIGHT KNEE ARTHROSCOPY/ PARTIAL LATERAL MENISECTOMY/ TRICOMPARTMENT  CHONDROPLASTY/ DECOMPRESSION CYST  10-26-2009  . RIGHT KNEE CLOSED MANIPULATION  09-11-2010  . RIGHT SHOULDER ARTHROSCOPY  2010  &  2004  . TIBIA CYST REMOVED AND ORIF LEG FX  1958  . TONSILLECTOMY  1968  . TOTAL KNEE ARTHROPLASTY  07-16-2010   RIGHT  . TOTAL THYROIDECTOMY  2009   CANCER  (POST-OP BLEED)  AND RADIATION [place]  . trigger fingers Right 2013   3rd and 4th fingers  . VAGINAL HYSTERECTOMY  1990    Social History  Substance Use Topics  . Smoking status: Never Smoker  . Smokeless tobacco: Never Used  . Alcohol use Yes     Comment: RARE    Family History  Problem Relation Age of Onset  . Colon polyps Sister 64  . Alzheimer's disease Mother   . Heart disease Father   . Cirrhosis Father   . Heart attack Father   . Macular degeneration Father   . Colon cancer Neg Hx     Allergies  Allergen Reactions  . Ciprofloxacin Diarrhea  . Other Other (See Comments)    Artificial sweetners  . Sulfa Antibiotics Hives    Medication list has been reviewed and updated.  Current Outpatient Prescriptions on File Prior to Visit  Medication Sig Dispense Refill  . acetaminophen (TYLENOL) 500 MG tablet Take 500 mg by mouth every 6 (six) hours as needed.    Marland Kitchen albuterol (PROVENTIL HFA;VENTOLIN HFA) 108 (90 Base) MCG/ACT inhaler Inhale 2 puffs into the lungs every 6 (six) hours as needed for wheezing or shortness of breath. 1 Inhaler 2  . azelastine (ASTELIN) 0.1 % nasal spray PLACE 2 SPRAYS INTO BOTH NOSTRILS 2 TIMES DAILY AS DIRECTED 90 mL 0  . benzonatate (TESSALON) 100 MG capsule Take 1 capsule (100 mg total) by mouth 3 (three) times daily as needed. 30 capsule 1  . blood glucose meter kit and supplies KIT Test blood sugar once daily. Dx code: E11.9 1 each 0  . cetirizine (ZYRTEC) 10 MG tablet Take 10 mg by mouth daily as needed for allergies.     Marland Kitchen eletriptan (RELPAX) 20 MG tablet TAKE 1 TABLET BY MOUTH AS NEEDED FOR MIGRAINE OR HEADACHE. MAY REPEAT IN 2 HOURS IF HEADACHE PERSISTS OR  RECURS. 9 tablet 4  . Erenumab-aooe (AIMOVIG) 70 MG/ML SOAJ Inject 70 mg into the skin every 30 (thirty) days. 1 pen 11  . levothyroxine (SYNTHROID, LEVOTHROID) 125 MCG tablet Take 1 tablet (125 mcg total) by mouth daily before breakfast. 30 tablet 5  . lidocaine (LIDODERM) 5 % Place 1 patch onto the skin as needed (for pain). Remove & Discard patch within 12 hours or as directed by MD    . losartan (COZAAR) 50 MG tablet Take 1 tablet (50 mg total) by mouth daily. 90 tablet 1  . metaxalone (SKELAXIN) 800 MG tablet Take 1 tablet (800 mg total) by mouth 3 (three) times daily. 90 tablet 0  . metFORMIN (GLUCOPHAGE) 1000 MG tablet TAKE 1 TABLET BY MOUTH 2  TIMES DAILY WITH A MEAL. 180 tablet 0  . metoprolol succinate (TOPROL-XL) 25 MG 24 hr tablet Take 1 tablet (25 mg total) by mouth every morning. 90 tablet 3  . Mometasone Furoate 110 MCG/INH AEPB 2 puffs twice daily. Rinse mouth out after each use. 1 Inhaler 1  . montelukast (SINGULAIR) 10 MG tablet TAKE 1 TABLET BY MOUTH AT BEDTIME. 90 tablet 3  . Multiple Vitamins-Minerals (ICAPS AREDS 2) CAPS Take by mouth.    Marland Kitchen MYRBETRIQ 50 MG TB24 tablet   11  . NASONEX 50 MCG/ACT nasal spray PLACE 2 SPRAYS INTO THE NOSE DAILY. 17 g 9  . nitrofurantoin (MACRODANTIN) 50 MG capsule Take 50 mg by mouth at bedtime.     . NON FORMULARY     . OVER THE COUNTER MEDICATION Take 1 tablet by mouth 2 (two) times daily. Hemp Oil    . polyethylene glycol (MIRALAX / GLYCOLAX) packet Take 17 g by mouth daily.    . pravastatin (PRAVACHOL) 10 MG tablet Take 1 tablet (10 mg total) by mouth daily. 90 tablet 3  . Probiotic CAPS Take 1 capsule by mouth 2 (two) times daily with a meal.    . sitaGLIPtin (JANUVIA) 100 MG tablet Take 1 tablet (100 mg total) by mouth daily. 90 tablet 3  . traZODone (DESYREL) 150 MG tablet Take 200 mg by mouth at bedtime.     Marland Kitchen zolpidem (AMBIEN) 5 MG tablet Take one half tablet at night as needed for sleep 30 tablet 1   No current facility-administered  medications on file prior to visit.     Review of Systems:  ***  Physical Examination: There were no vitals filed for this visit. There were no vitals filed for this visit. There is no height or weight on file to calculate BMI. Ideal Body Weight:    ***  Assessment and Plan: ***  Signed Lamar Blinks, MD

## 2017-05-20 ENCOUNTER — Ambulatory Visit: Payer: Self-pay | Admitting: Family Medicine

## 2017-05-20 DIAGNOSIS — Z0289 Encounter for other administrative examinations: Secondary | ICD-10-CM

## 2017-05-21 ENCOUNTER — Encounter: Payer: Self-pay | Admitting: Family Medicine

## 2017-05-21 ENCOUNTER — Ambulatory Visit (HOSPITAL_BASED_OUTPATIENT_CLINIC_OR_DEPARTMENT_OTHER)
Admission: RE | Admit: 2017-05-21 | Discharge: 2017-05-21 | Disposition: A | Discharge status: Home / Self Care | Payer: 59 | Source: Ambulatory Visit | Attending: Family Medicine | Admitting: Family Medicine

## 2017-05-21 ENCOUNTER — Ambulatory Visit (INDEPENDENT_AMBULATORY_CARE_PROVIDER_SITE_OTHER): Payer: 59 | Admitting: Family Medicine

## 2017-05-21 VITALS — BP 118/82 | HR 73 | Temp 97.8°F | Wt 180.4 lb

## 2017-05-21 DIAGNOSIS — R002 Palpitations: Secondary | ICD-10-CM | POA: Diagnosis not present

## 2017-05-21 DIAGNOSIS — M25552 Pain in left hip: Secondary | ICD-10-CM | POA: Insufficient documentation

## 2017-05-21 DIAGNOSIS — Z130 Encounter for screening for diseases of the blood and blood-forming organs and certain disorders involving the immune mechanism: Secondary | ICD-10-CM | POA: Diagnosis not present

## 2017-05-21 DIAGNOSIS — E039 Hypothyroidism, unspecified: Secondary | ICD-10-CM | POA: Diagnosis not present

## 2017-05-21 DIAGNOSIS — M1612 Unilateral primary osteoarthritis, left hip: Secondary | ICD-10-CM | POA: Diagnosis not present

## 2017-05-21 DIAGNOSIS — E119 Type 2 diabetes mellitus without complications: Secondary | ICD-10-CM | POA: Diagnosis not present

## 2017-05-21 LAB — CBC
HCT: 41.1 % (ref 36.0–46.0)
HEMOGLOBIN: 13.4 g/dL (ref 12.0–15.0)
MCHC: 32.5 g/dL (ref 30.0–36.0)
MCV: 87 fl (ref 78.0–100.0)
PLATELETS: 212 10*3/uL (ref 150.0–400.0)
RBC: 4.72 Mil/uL (ref 3.87–5.11)
RDW: 13.8 % (ref 11.5–15.5)
WBC: 6.7 10*3/uL (ref 4.0–10.5)

## 2017-05-21 LAB — COMPREHENSIVE METABOLIC PANEL
ALBUMIN: 4.1 g/dL (ref 3.5–5.2)
ALT: 13 U/L (ref 0–35)
AST: 15 U/L (ref 0–37)
Alkaline Phosphatase: 58 U/L (ref 39–117)
BUN: 12 mg/dL (ref 6–23)
CALCIUM: 9.3 mg/dL (ref 8.4–10.5)
CHLORIDE: 101 meq/L (ref 96–112)
CO2: 30 mEq/L (ref 19–32)
Creatinine, Ser: 0.54 mg/dL (ref 0.40–1.20)
GFR: 119.37 mL/min (ref >60.00)
Glucose, Bld: 113 mg/dL — ABNORMAL HIGH (ref 70–99)
POTASSIUM: 3.6 meq/L (ref 3.5–5.1)
SODIUM: 138 meq/L (ref 135–145)
Total Bilirubin: 0.4 mg/dL (ref 0.2–1.2)
Total Protein: 6.7 g/dL (ref 6.0–8.3)

## 2017-05-21 LAB — HEMOGLOBIN A1C: Hgb A1c MFr Bld: 6.6 % — ABNORMAL HIGH (ref 4.6–6.5)

## 2017-05-21 MED FILL — MONTELUKAST SOD 10 MG TAB: 10 | 90 days supply | Qty: 90 | Fill #1

## 2017-05-21 MED FILL — METAXALONE 800 MG TABLET: 800 | 30 days supply | Qty: 90 | Fill #0

## 2017-05-21 MED FILL — metFORMIN HCL 1000 MG TABS: 1000 | 90 days supply | Qty: 180 | Fill #0

## 2017-05-21 MED FILL — ELETRIPTAN HBR 20 MG TABLET: 20 | 30 days supply | Qty: 9 | Fill #1

## 2017-05-21 NOTE — Progress Notes (Addendum)
[place] [facility] [address] [place], [place] [address] [phone]  Date:  05/21/2017   Name:  Debbie Hayes   DOB:  1950/09/12   MRN:  [id number]  PCP:  Darreld Mclean, MD    Chief Complaint: Palpitations (c/o heart palpitations this past Saturday night. )   History of Present Illness:  Debbie Hayes is a 66 y.o. very pleasant female patient who presents with the following:  History of controlled DM Pt notes that this past Saturday night she was having some PVCs while laying in bed (today is Thursday). She did do a stress with Wynonia Lawman about 5 years ago which looked fine.  She will sometimes have palliations after drinking tea, but she had not had any tea this time She notes that the palpitations felt different than usual- felt like she was skipping a beat, lasted longer than normal, but did not have any pressure or pain. No nausea, sweating. She went to sleep and the palpitations seem to have gone away.  She feels fine now She is able to walk, etc with no pain or SOB  She would like me to refer her back to Dr. Wynonia Lawman if need be  She was in on 10/15 with illness-  Lab Results  Component Value Date   HGBA1C 6.3 12/24/2016   Hypothyroidism- recent TSH was normal  Lab Results  Component Value Date   TSH 1.79 04/27/2017   Last CMP was in May  Flu shot: done for the year  She has noted left hip pain for 3-4 months, getting worse.  No known injury.  It can feel still and sore with walking  Patient Active Problem List   Diagnosis Date Noted  . Snoring 03/25/2016  . Palpitations 03/25/2016  . Sleep related headaches 03/25/2016  . Trigeminal anesthesia 11/26/2015  . Spondylolisthesis of lumbar region 08/09/2014  . Back pain 02/17/2014  . Macular degeneration 02/17/2014  . Other and unspecified hyperlipidemia 10/14/2013  . Onychomycosis 12/01/2012  . Diabetes mellitus ([place]) 04/12/2012  . Arthritis  04/12/2012  . Hypothyroid 04/12/2012    Past Medical History:  Diagnosis Date  . Allergy   . Anxiety   . Cataract   . Chronic migraine BOTOX INJECTION EVERY 3 MONTHS  . Chronic pain in right shoulder   . Constipation   . Diabetes mellitus   . Disorder of inner ear CAUSES VERTIGO OCCASIONALLY  . Fibromyalgia   . GERD (gastroesophageal reflux disease)   . Hemorrhoid   . History of bladder infections   . History of thyroid cancer 2009  S/P TOTAL THYROIDECTOMY AND RADIATION  . Hyperlipidemia   . Hypertension CARDIOLOGIST- DR Wynonia Lawman- WILL REQUEST LATE NOTE   DENIES S & S  . Hypothyroidism   . Insomnia   . Left shoulder pain   . Macular degeneration   . Normal nuclear stress test 01-25-2009  . OA (osteoarthritis) JOINTS  . PONV (postoperative nausea and vomiting) SEVERE  . Rotator cuff disorder LEFT SHOULDER RTC IMPINGMENT  . Spondylolisthesis of lumbar region   . 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 Wears glasses     Past Surgical History:  Procedure Laterality Date  . BILATERAL CARPAL TUNNEL RELEASE  1994  . BILATERAL ELBOW SURG.  1999  . BILATERAL SALPINGOOPHECTOMY  1993   POST-OP URETER REPAIR 12 DAYS AFTER   . FOOT ARTHRODESIS Left 07/10/2016   Procedure: LEFT 2ND TARSAL METATARSAL ARTHRODESIS  GASTROC RECESSION LEFT LAPIDUS MODIFIED MCBRIDE BUIONECTOMY;  Surgeon: Wylene Simmer, MD;  Location: [place];  Service: Orthopedics;  Laterality: Left;  Marland Kitchen GASTROC RECESSION EXTREMITY Left 07/10/2016   Procedure: GASTROC RECESSION EXTREMITY;  Surgeon: Wylene Simmer, MD;  Location: [place];  Service: Orthopedics;  Laterality: Left;  . LEFT THUMB JOINT REPLACEMENT  2005   RIGHT DONE IN 2004  . RIGHT KNEE ARTHROSCOPY  X2  BEFORE 2011  . RIGHT KNEE ARTHROSCOPY/ PARTIAL LATERAL MENISECTOMY/ TRICOMPARTMENT CHONDROPLASTY/ DECOMPRESSION CYST  10-26-2009  . RIGHT KNEE CLOSED MANIPULATION  09-11-2010  . RIGHT SHOULDER ARTHROSCOPY  2010  &  2004  .  TIBIA CYST REMOVED AND ORIF LEG FX  1958  . TONSILLECTOMY  1968  . TOTAL KNEE ARTHROPLASTY  07-16-2010   RIGHT  . TOTAL THYROIDECTOMY  2009   CANCER  (POST-OP BLEED)  AND RADIATION TX  . trigger fingers Right 2013   3rd and 4th fingers  . VAGINAL HYSTERECTOMY  1990    Social History  Substance Use Topics  . Smoking status: Never Smoker  . Smokeless tobacco: Never Used  . Alcohol use Yes     Comment: RARE    Family History  Problem Relation Age of Onset  . Colon polyps Sister 82  . Alzheimer's disease Mother   . Heart disease Father   . Cirrhosis Father   . Heart attack Father   . Macular degeneration Father   . Colon cancer Neg Hx     Allergies  Allergen Reactions  . Ciprofloxacin Diarrhea  . Other Other (See Comments)    Artificial sweetners  . Sulfa Antibiotics Hives    Medication list has been reviewed and updated.  Current Outpatient Prescriptions on File Prior to Visit  Medication Sig Dispense Refill  . acetaminophen (TYLENOL) 500 MG tablet Take 500 mg by mouth every 6 (six) hours as needed.    Marland Kitchen albuterol (PROVENTIL HFA;VENTOLIN HFA) 108 (90 Base) MCG/ACT inhaler Inhale 2 puffs into the lungs every 6 (six) hours as needed for wheezing or shortness of breath. 1 Inhaler 2  . azelastine (ASTELIN) 0.1 % nasal spray PLACE 2 SPRAYS INTO BOTH NOSTRILS 2 TIMES DAILY AS DIRECTED 90 mL 0  . benzonatate (TESSALON) 100 MG capsule Take 1 capsule (100 mg total) by mouth 3 (three) times daily as needed. 30 capsule 1  . blood glucose meter kit and supplies KIT Test blood sugar once daily. Dx code: E11.9 1 each 0  . cetirizine (ZYRTEC) 10 MG tablet Take 10 mg by mouth daily as needed for allergies.     Marland Kitchen eletriptan (RELPAX) 20 MG tablet TAKE 1 TABLET BY MOUTH AS NEEDED FOR MIGRAINE OR HEADACHE. MAY REPEAT IN 2 HOURS IF HEADACHE PERSISTS OR RECURS. 9 tablet 4  . Erenumab-aooe (AIMOVIG) 70 MG/ML SOAJ Inject 70 mg into the skin every 30 (thirty) days. 1 pen 11  . levothyroxine  (SYNTHROID, LEVOTHROID) 125 MCG tablet Take 1 tablet (125 mcg total) by mouth daily before breakfast. 30 tablet 5  . lidocaine (LIDODERM) 5 % Place 1 patch onto the skin as needed (for pain). Remove & Discard patch within 12 hours or as directed by MD    . losartan (COZAAR) 50 MG tablet Take 1 tablet (50 mg total) by mouth daily. 90 tablet 1  . metaxalone (SKELAXIN) 800 MG tablet Take 1 tablet (800 mg total) by mouth 3 (three) times daily. 90 tablet 0  . metFORMIN (GLUCOPHAGE) 1000 MG tablet TAKE 1  TABLET BY MOUTH 2 TIMES DAILY WITH A MEAL. 180 tablet 0  . metoprolol succinate (TOPROL-XL) 25 MG 24 hr tablet Take 1 tablet (25 mg total) by mouth every morning. 90 tablet 3  . Mometasone Furoate 110 MCG/INH AEPB 2 puffs twice daily. Rinse mouth out after each use. 1 Inhaler 1  . montelukast (SINGULAIR) 10 MG tablet TAKE 1 TABLET BY MOUTH AT BEDTIME. 90 tablet 3  . Multiple Vitamins-Minerals (ICAPS AREDS 2) CAPS Take by mouth.    Marland Kitchen MYRBETRIQ 50 MG TB24 tablet   11  . NASONEX 50 MCG/ACT nasal spray PLACE 2 SPRAYS INTO THE NOSE DAILY. 17 g 9  . nitrofurantoin (MACRODANTIN) 50 MG capsule Take 50 mg by mouth at bedtime.     . NON FORMULARY     . OVER THE COUNTER MEDICATION Take 1 tablet by mouth 2 (two) times daily. Hemp Oil    . polyethylene glycol (MIRALAX / GLYCOLAX) packet Take 17 g by mouth daily.    . pravastatin (PRAVACHOL) 10 MG tablet Take 1 tablet (10 mg total) by mouth daily. 90 tablet 3  . Probiotic CAPS Take 1 capsule by mouth 2 (two) times daily with a meal.    . sitaGLIPtin (JANUVIA) 100 MG tablet Take 1 tablet (100 mg total) by mouth daily. 90 tablet 3  . traZODone (DESYREL) 150 MG tablet Take 200 mg by mouth at bedtime.     Marland Kitchen zolpidem (AMBIEN) 5 MG tablet Take one half tablet at night as needed for sleep 30 tablet 1   No current facility-administered medications on file prior to visit.     Review of Systems:  As per HPI- otherwise negative.   Physical Examination: Vitals:    05/21/17 1004  BP: 118/82  Pulse: 73  Temp: 97.8 F (36.6 C)  SpO2: 98%   Vitals:   05/21/17 1004  Weight: 180 lb 6.4 oz (81.8 kg)   Body mass index is 27.43 kg/m. Ideal Body Weight:    GEN: WDWN, NAD, Non-toxic, A & O x 3, mild overweight, looks well HEENT: Atraumatic, Normocephalic. Neck supple. No masses, No LAD.  Bilateral TM wnl, oropharynx normal.  PEERL,EOMI.   Ears and Nose: No external deformity. CV: RRR, No M/G/R. No JVD. No thrill. No extra heart sounds. PULM: CTA B, no wheezes, crackles, rhonchi. No retractions. No resp. distress. No accessory muscle use. ABD: S, NT, ND, +BS. No rebound. No HSM. EXTR: No c/c/e NEURO Normal gait.  PSYCH: Normally interactive. Conversant. Not depressed or anxious appearing.  Calm demeanor.  Left hip: normal ROM, really no tenderness with flexion of hip or internal/ external rotation.  tenderness seems to be over the left greater trochanter.  No redness or heat   EKG: SR, incomplete right BBB- compared to past tracings and looks quite similar   Dg Chest 2 View  Result Date: 05/11/2017 CLINICAL DATA:  Chronic cough for several weeks EXAM: CHEST  2 VIEW COMPARISON:  01/03/2015 FINDINGS: Cardiac shadow is stable. Aortic calcifications are again seen. The lungs are well-aerated without focal infiltrate or sizable effusion. Degenerative changes of the thoracic spine are noted. IMPRESSION: No active cardiopulmonary disease. Electronically Signed   By: Alcide Clever M.D.   On: 05/11/2017 10:08   Dg Hip Unilat W Or W/o Pelvis 2-3 Views Left  Result Date: 05/21/2017 CLINICAL DATA:  Left hip pain for 2 months, no known injury, initial encounter EXAM: DG HIP (WITH OR WITHOUT PELVIS) 2-3V LEFT COMPARISON:  None. FINDINGS: The pelvic  ring is intact. Degenerative changes of the hip joints are noted bilaterally. Postsurgical changes are noted in the lower lumbar spine. No acute soft tissue abnormality is seen. IMPRESSION: Degenerative change without  acute abnormality. Electronically Signed   By: Inez Catalina M.D.   On: 05/21/2017 11:00    Assessment and Plan:  Palpitations - Plan: EKG 12-Lead, Ambulatory referral to Cardiology  Acquired hypothyroidism  Diabetes mellitus without complication ([place]) - Plan: Comprehensive metabolic panel, Hemoglobin A1c  Left hip pain - Plan: DG HIP UNILAT W OR W/O PELVIS 2-3 VIEWS LEFT  Screening for deficiency anemia - Plan: CBC  Here today with left hip pain and a recent episode of palpitations, now resolved Suspect trochanteric bursitis offered to do injection but she declines for now, will keep an eye on her hip.  She does have OA evident on films palpations are likely benign, but different from her normal occasional symptoms.  Referral back to Dr. Wynonia Lawman for a consultation   Received labs 10/26- message to pt  Results for orders placed or performed in visit on 05/21/17  Comprehensive metabolic panel  Result Value Ref Range   Sodium 138 135 - 145 mEq/L   Potassium 3.6 3.5 - 5.1 mEq/L   Chloride 101 96 - 112 mEq/L   CO2 30 19 - 32 mEq/L   Glucose, Bld 113 (H) 70 - 99 mg/dL   BUN 12 6 - 23 mg/dL   Creatinine, Ser 0.54 0.40 - 1.20 mg/dL   Total Bilirubin 0.4 0.2 - 1.2 mg/dL   Alkaline Phosphatase 58 39 - 117 U/L   AST 15 0 - 37 U/L   ALT 13 0 - 35 U/L   Total Protein 6.7 6.0 - 8.3 g/dL   Albumin 4.1 3.5 - 5.2 g/dL   Calcium 9.3 8.4 - 10.5 mg/dL   GFR 119.37 >60.00 mL/min  Hemoglobin A1c  Result Value Ref Range   Hgb A1c MFr Bld 6.6 (H) 4.6 - 6.5 %  CBC  Result Value Ref Range   WBC 6.7 4.0 - 10.5 K/uL   RBC 4.72 3.87 - 5.11 Mil/uL   Platelets 212.0 150.0 - 400.0 K/uL   Hemoglobin 13.4 12.0 - 15.0 g/dL   HCT 41.1 36.0 - 46.0 %   MCV 87.0 78.0 - 100.0 fl   MCHC 32.5 30.0 - 36.0 g/dL   RDW 13.8 11.5 - 15.5 %     Signed Lamar Blinks, MD

## 2017-05-21 NOTE — Patient Instructions (Signed)
It was good to see you again today!  Please stop by lab and then go to x-ray on the ground floor. I will be in touch with your labs asap, and will refer you back to [facility] for your heart.  Please let me know if any other concerns about palpitations

## 2017-05-22 ENCOUNTER — Encounter: Payer: Self-pay | Admitting: Family Medicine

## 2017-05-25 ENCOUNTER — Encounter: Payer: Self-pay | Admitting: Family Medicine

## 2017-05-26 MED ORDER — FLUCONAZOLE 150 MG PO TABS: 150.0000 mg | ORAL_TABLET | Freq: Once | ORAL | 0 refills | Status: AC

## 2017-05-26 MED FILL — FLUCONAZOLE 150 MG TABLET: 150 | 7 days supply | Qty: 2 | Fill #0

## 2017-06-03 ENCOUNTER — Encounter: Payer: Self-pay | Admitting: Internal Medicine

## 2017-06-09 MED FILL — OLOPATADINE HCL 0.2 % SOLN: 0.2 | 25 days supply | Qty: 3 | Fill #5

## 2017-06-09 MED FILL — JANUVIA 100 MG TABLET: 100 | 30 days supply | Qty: 30 | Fill #9

## 2017-06-16 ENCOUNTER — Ambulatory Visit (INDEPENDENT_AMBULATORY_CARE_PROVIDER_SITE_OTHER): Payer: 59 | Admitting: Family

## 2017-06-16 ENCOUNTER — Encounter: Payer: Self-pay | Admitting: Family

## 2017-06-16 VITALS — BP 120/67 | HR 76 | Temp 98.7°F | Resp 16 | Ht 68.0 in | Wt 179.2 lb

## 2017-06-16 DIAGNOSIS — L600 Ingrowing nail: Secondary | ICD-10-CM | POA: Diagnosis not present

## 2017-06-16 DIAGNOSIS — L03031 Cellulitis of right toe: Secondary | ICD-10-CM

## 2017-06-16 DIAGNOSIS — E785 Hyperlipidemia, unspecified: Secondary | ICD-10-CM

## 2017-06-16 LAB — LIPID PANEL
CHOL/HDL RATIO: 4
CHOLESTEROL: 201 mg/dL — AB (ref 0–200)
HDL: 48.6 mg/dL (ref >39.00)
LDL CALC: 120 mg/dL — AB (ref 0–99)
NonHDL: 152.26
TRIGLYCERIDES: 160 mg/dL — AB (ref 0.0–149.0)
VLDL: 32 mg/dL (ref 0.0–40.0)

## 2017-06-16 MED ORDER — CLINDAMYCIN HCL 300 MG PO CAPS: 300.0000 mg | ORAL_CAPSULE | Freq: Three times a day (TID) | ORAL | 0 refills | Status: DC

## 2017-06-16 MED FILL — CLINDAMYCIN HCL 300 MG CAPS: 300 | 10 days supply | Qty: 30 | Fill #0

## 2017-06-16 NOTE — Progress Notes (Signed)
Subjective:    Patient ID: Debbie Hayes, female    DOB: [birth year], 66 y.o.   MRN: [id number]  HPI  Debbie Hayes is a 66 yr old female who presents today with chief complaint of pain/redness of the left great toe.     Review of Systems See HPI  Past Medical History:  Diagnosis Date  . Allergy   . Anxiety   . Cataract   . Chronic migraine BOTOX INJECTION EVERY 3 MONTHS  . Chronic pain in right shoulder   . Constipation   . Diabetes mellitus   . Disorder of inner ear CAUSES VERTIGO OCCASIONALLY  . Fibromyalgia   . GERD (gastroesophageal reflux disease)   . Hemorrhoid   . History of bladder infections   . History of thyroid cancer 2009  S/P TOTAL THYROIDECTOMY AND RADIATION  . Hyperlipidemia   . Hypertension CARDIOLOGIST- DR Wynonia Lawman- WILL REQUEST LATE NOTE   DENIES S & S  . Hypothyroidism   . Insomnia   . Left shoulder pain   . Macular degeneration   . Normal nuclear stress test 01-25-2009  . OA (osteoarthritis) JOINTS  . PONV (postoperative nausea and vomiting) SEVERE  . Rotator cuff disorder LEFT SHOULDER RTC IMPINGMENT  . Spondylolisthesis of lumbar region   . 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 Wears glasses      Social History   Socioeconomic History  . Marital status: Married    Spouse name: Not on file  . Number of children: Not on file  . Years of education: Not on file  . Highest education level: Not on file  Social Needs  . Financial resource strain: Not on file  . Food insecurity - worry: Not on file  . Food insecurity - inability: Not on file  . Transportation needs - medical: Not on file  . Transportation needs - non-medical: Not on file  Occupational History  . Not on file  Tobacco Use  . Smoking status: Never Smoker  . Smokeless tobacco: Never Used  Substance and Sexual Activity  . Alcohol use: Yes    Comment: RARE  . Drug use: No  . Sexual activity: Not on file  Other Topics Concern  . Not on file  Social History Narrative    . Not on file    Past Surgical History:  Procedure Laterality Date  . BILATERAL CARPAL TUNNEL RELEASE  1994  . BILATERAL ELBOW SURG.  1999  . BILATERAL SALPINGOOPHECTOMY  1993   POST-OP URETER REPAIR 12 DAYS AFTER   . GASTROC RECESSION EXTREMITY Left 07/10/2016   Performed by [facility]mer[facility] at [facility]  . LEFT 2ND TARSAL METATARSAL ARTHRODESIS  GASTROC RECESSION LEFT LAPIDUS MODIFIED MCBRIDE BUIONECTOMY Left 07/10/2016   [facility] by [facility]mer[facility] at [facility]  . LEFT THUMB JOINT REPLACEMENT  2005   RIGHT DONE IN 2004  . POSTERIOR LUMBAR INTERBODY  FUSION  LUMBAR FOUR-FIVE WITH POSTERIOR LATERAL ARTHRODESIS, POSTERIOR NONSEGMENTAL INSTRUMENTATION N/A 08/09/2014   Performed by Newman Pies, MD at [place] ARTHROSCOPY  X2  BEFORE 2011  . RIGHT KNEE ARTHROSCOPY/ PARTIAL LATERAL MENISECTOMY/ TRICOMPARTMENT CHONDROPLASTY/ DECOMPRESSION CYST  10-26-2009  . RIGHT KNEE CLOSED MANIPULATION  09-11-2010  . RIGHT SHOULDER ARTHROSCOPY  2010  &  2004  . SHOULDER ARTHROSCOPY WITH SUBACROMIAL DECOMPRESSION, ROTATOR CUFF REPAIR AND BICEP TENDON REPAIR Left 09/10/2011   Performed by Sydnee Cabal, MD at [facility]  .  TIBIA CYST REMOVED AND ORIF LEG FX  1958  . TONSILLECTOMY  1968  . TOTAL KNEE ARTHROPLASTY  07-16-2010   RIGHT  . TOTAL THYROIDECTOMY  2009   CANCER  (POST-OP BLEED)  AND RADIATION TX  . trigger fingers Right 2013   3rd and 4th fingers  . VAGINAL HYSTERECTOMY  1990    Family History  Problem Relation Age of Onset  . Colon polyps Sister 21  . Alzheimer's disease Mother   . Heart disease Father   . Cirrhosis Father   . Heart attack Father   . Macular degeneration Father   . Colon cancer Neg Hx     Allergies  Allergen Reactions  . Ciprofloxacin Diarrhea  . Other Other (See Comments)    Artificial sweetners  . Sulfa Antibiotics Hives    Current Outpatient Medications on File Prior to Visit  Medication  Sig Dispense Refill  . acetaminophen (TYLENOL) 500 MG tablet Take 500 mg by mouth every 6 (six) hours as needed.    Marland Kitchen albuterol (PROVENTIL HFA;VENTOLIN HFA) 108 (90 Base) MCG/ACT inhaler Inhale 2 puffs into the lungs every 6 (six) hours as needed for wheezing or shortness of breath. 1 Inhaler 2  . azelastine (ASTELIN) 0.1 % nasal spray PLACE 2 SPRAYS INTO BOTH NOSTRILS 2 TIMES DAILY AS DIRECTED 90 mL 0  . benzonatate (TESSALON) 100 MG capsule Take 1 capsule (100 mg total) by mouth 3 (three) times daily as needed. 30 capsule 1  . blood glucose meter kit and supplies KIT Test blood sugar once daily. Dx code: E11.9 1 each 0  . cetirizine (ZYRTEC) 10 MG tablet Take 10 mg by mouth daily as needed for allergies.     Marland Kitchen eletriptan (RELPAX) 20 MG tablet TAKE 1 TABLET BY MOUTH AS NEEDED FOR MIGRAINE OR HEADACHE. MAY REPEAT IN 2 HOURS IF HEADACHE PERSISTS OR RECURS. 9 tablet 4  . Erenumab-aooe (AIMOVIG) 70 MG/ML SOAJ Inject 70 mg into the skin every 30 (thirty) days. 1 pen 11  . levothyroxine (SYNTHROID, LEVOTHROID) 125 MCG tablet Take 1 tablet (125 mcg total) by mouth daily before breakfast. 30 tablet 5  . lidocaine (LIDODERM) 5 % Place 1 patch onto the skin as needed (for pain). Remove & Discard patch within 12 hours or as directed by MD    . losartan (COZAAR) 50 MG tablet Take 1 tablet (50 mg total) by mouth daily. 90 tablet 1  . metaxalone (SKELAXIN) 800 MG tablet Take 1 tablet (800 mg total) by mouth 3 (three) times daily. 90 tablet 0  . metFORMIN (GLUCOPHAGE) 1000 MG tablet TAKE 1 TABLET BY MOUTH 2 TIMES DAILY WITH A MEAL. 180 tablet 0  . metoprolol succinate (TOPROL-XL) 25 MG 24 hr tablet Take 1 tablet (25 mg total) by mouth every morning. 90 tablet 3  . Mometasone Furoate 110 MCG/INH AEPB 2 puffs twice daily. Rinse mouth out after each use. 1 Inhaler 1  . montelukast (SINGULAIR) 10 MG tablet TAKE 1 TABLET BY MOUTH AT BEDTIME. 90 tablet 3  . Multiple Vitamins-Minerals (ICAPS AREDS 2) CAPS Take by mouth.     Marland Kitchen MYRBETRIQ 50 MG TB24 tablet   11  . NASONEX 50 MCG/ACT nasal spray PLACE 2 SPRAYS INTO THE NOSE DAILY. 17 g 9  . nitrofurantoin (MACRODANTIN) 50 MG capsule Take 50 mg by mouth at bedtime.     . NON FORMULARY     . polyethylene glycol (MIRALAX / GLYCOLAX) packet Take 17 g by mouth daily.    Marland Kitchen  pravastatin (PRAVACHOL) 10 MG tablet Take 1 tablet (10 mg total) by mouth daily. 90 tablet 3  . Probiotic CAPS Take 1 capsule by mouth 2 (two) times daily with a meal.    . sitaGLIPtin (JANUVIA) 100 MG tablet Take 1 tablet (100 mg total) by mouth daily. 90 tablet 3  . traZODone (DESYREL) 150 MG tablet Take 200 mg by mouth at bedtime.     Marland Kitchen zolpidem (AMBIEN) 5 MG tablet Take one half tablet at night as needed for sleep 30 tablet 1   No current facility-administered medications on file prior to visit.     BP 120/67 (BP Location: Left Arm, Cuff Size: Normal)   Pulse 76   Temp 98.7 F (37.1 C) (Oral)   Resp 16   Ht _0  (1.727 m)   Wt 179 lb 3.2 oz (81.3 kg)   SpO2 97%   BMI 27.25 kg/m       Objective:   Physical Exam  Constitutional: Debbie Hayes is oriented to person, place, and time. Debbie Hayes appears well-developed and well-nourished. No distress.  Neurological: Debbie Hayes is alert and oriented to person, place, and time.  Skin:  + erythema noted surrounding left lateral nailbed of right great toe.  Toenail is curved and appears slightly ingrown  Psychiatric: Her behavior is normal. Judgment and thought content normal.          Assessment & Plan:  Ingrown toenail with cellulitis- mild. Intolerant to doxy and allergic to sulfa.  Recently completed keflex. Will rx with clindamycin for MRSA coverage and refer to podiatry. Debbie Hayes is advised to  call if redness/pain/swelling worsens or if it does not improve in the next few days.    Hyperlipidemia- has bene taking fish oil and requests follow up lipid panel today.

## 2017-06-16 NOTE — Patient Instructions (Signed)
Please begin clindamycin (antibiotic) for skin infection in your toe.  You will be contacted about your appointment with podiatry for your ingrown toenail. Call if redness/pain/swelling worsens or if it does not improve in the next few days.

## 2017-06-17 ENCOUNTER — Encounter: Payer: Self-pay | Admitting: Family

## 2017-06-24 ENCOUNTER — Ambulatory Visit: Payer: 59 | Admitting: Neurology

## 2017-06-26 ENCOUNTER — Encounter: Payer: Self-pay | Admitting: Podiatry

## 2017-06-26 ENCOUNTER — Ambulatory Visit (INDEPENDENT_AMBULATORY_CARE_PROVIDER_SITE_OTHER): Payer: 59 | Admitting: Podiatry

## 2017-06-26 VITALS — BP 125/70 | HR 69 | Resp 16

## 2017-06-26 DIAGNOSIS — R600 Localized edema: Secondary | ICD-10-CM | POA: Diagnosis not present

## 2017-06-26 DIAGNOSIS — M21612 Bunion of left foot: Secondary | ICD-10-CM | POA: Diagnosis not present

## 2017-06-26 DIAGNOSIS — G4733 Obstructive sleep apnea (adult) (pediatric): Secondary | ICD-10-CM | POA: Insufficient documentation

## 2017-06-26 DIAGNOSIS — L600 Ingrowing nail: Secondary | ICD-10-CM

## 2017-06-26 DIAGNOSIS — G4730 Sleep apnea, unspecified: Secondary | ICD-10-CM

## 2017-06-26 HISTORY — DX: Sleep apnea, unspecified: G47.30

## 2017-06-26 MED ORDER — NEOMYCIN-POLYMYXIN-HC 3.5-10000-1 OT SOLN: OTIC | 0 refills | Status: DC

## 2017-06-26 NOTE — Patient Instructions (Signed)
  THE DAY AFTER YOUR NAIL PROCEDURE  Place 1/4 cup of epsom salts in a quart of warm tap water.  Submerge your foot or feet with outer bandage intact for the initial soak; this will allow the bandage to become moist and wet for easy lift off.  Once you remove your bandage, continue to soak in the solution for 20 minutes.  This soak should be done twice a day.  Next, remove your foot or feet from solution, blot dry the affected area and cover.  You may use a band aid large enough to cover the area or use gauze and tape.  Apply other medications to the area as directed by the doctor such as polysporin neosporin.  IF YOUR SKIN BECOMES IRRITATED WHILE USING THESE INSTRUCTIONS, IT IS OKAY TO SWITCH TO  WHITE VINEGAR AND WATER. Or you may use antibacterial soap and water to keep the toe clean  Monitor for any signs/symptoms of infection. Call the office immediately if any occur or go directly to the emergency room. Call with any questions/concerns.    [facility] Instructions-Post Nail Surgery  You have had your ingrown toenail and root treated with a chemical.  This chemical causes a burn that will drain and ooze like a blister.  This can drain for 6-8 weeks or longer.  It is important to keep this area clean, covered, and follow the soaking instructions dispensed at the time of your surgery.  This area will eventually dry and form a scab.  Once the scab forms you no longer need to soak or apply a dressing.  If at any time you experience an increase in pain, redness, swelling, or drainage, you should contact the office as soon as possible.

## 2017-06-26 NOTE — Progress Notes (Signed)
Subjective:  Patient ID: Debbie Hayes, female    DOB: 13-Jul-1951,  MRN: [id number]  Chief Complaint  Patient presents with  . Toe Pain    Hallux left - lateral border x few months, has taken 2 rounds antibiotic completing last dose of clindamycin today, still red and swollen, soaking at home, concerned being a diabetic   Last A1C was 6.4 per patient checked one month ago.  66 y.o. female presents with the above complaint.  States that she has a painful left toe ingrown toenail.  Has taken 2 rounds of antibiotics and presents here for evaluation.  Reports recent history of surgery from Dr. Doran Durand to her left foot reports continued swelling.  Past Medical History:  Diagnosis Date  . Allergy   . Anxiety   . Cataract   . Chronic migraine BOTOX INJECTION EVERY 3 MONTHS  . Chronic pain in right shoulder   . Constipation   . Diabetes mellitus   . Disorder of inner ear CAUSES VERTIGO OCCASIONALLY  . Fibromyalgia   . GERD (gastroesophageal reflux disease)   . Hemorrhoid   . History of bladder infections   . History of thyroid cancer 2009  S/P TOTAL THYROIDECTOMY AND RADIATION  . Hyperlipidemia   . Hypertension CARDIOLOGIST- DR Wynonia Lawman- WILL REQUEST LATE NOTE   DENIES S & S  . Hypothyroidism   . Insomnia   . Left shoulder pain   . Macular degeneration   . Normal nuclear stress test 01-25-2009  . OA (osteoarthritis) JOINTS  . PONV (postoperative nausea and vomiting) SEVERE  . Rotator cuff disorder LEFT SHOULDER RTC IMPINGMENT  . Sleep apnea in adult 06/26/2017  . Spondylolisthesis of lumbar region   . 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 Wears glasses    Past Surgical History:  Procedure Laterality Date  . BILATERAL CARPAL TUNNEL RELEASE  1994  . BILATERAL ELBOW SURG.  1999  . BILATERAL SALPINGOOPHECTOMY  1993   POST-OP URETER REPAIR 12 DAYS AFTER   . FOOT ARTHRODESIS Left 07/10/2016   Procedure: LEFT 2ND TARSAL METATARSAL ARTHRODESIS  GASTROC RECESSION LEFT  LAPIDUS MODIFIED MCBRIDE BUIONECTOMY;  Surgeon: Wylene Simmer, MD;  Location: [facility];  Service: Orthopedics;  Laterality: Left;  Marland Kitchen GASTROC RECESSION EXTREMITY Left 07/10/2016   Procedure: GASTROC RECESSION EXTREMITY;  Surgeon: Wylene Simmer, MD;  Location: [facility];  Service: Orthopedics;  Laterality: Left;  . LEFT THUMB JOINT REPLACEMENT  2005   RIGHT DONE IN 2004  . RIGHT KNEE ARTHROSCOPY  X2  BEFORE 2011  . RIGHT KNEE ARTHROSCOPY/ PARTIAL LATERAL MENISECTOMY/ TRICOMPARTMENT CHONDROPLASTY/ DECOMPRESSION CYST  10-26-2009  . RIGHT KNEE CLOSED MANIPULATION  09-11-2010  . RIGHT SHOULDER ARTHROSCOPY  2010  &  2004  . TIBIA CYST REMOVED AND ORIF LEG FX  1958  . TONSILLECTOMY  1968  . TOTAL KNEE ARTHROPLASTY  07-16-2010   RIGHT  . TOTAL THYROIDECTOMY  2009   CANCER  (POST-OP BLEED)  AND RADIATION TX  . trigger fingers Right 2013   3rd and 4th fingers  . VAGINAL HYSTERECTOMY  1990    Current Outpatient Medications:  .  acetaminophen (TYLENOL) 500 MG tablet, Take 500 mg by mouth every 6 (six) hours as needed., Disp: , Rfl:  .  albuterol (PROVENTIL HFA;VENTOLIN HFA) 108 (90 Base) MCG/ACT inhaler, Inhale 2 puffs into the lungs every 6 (six) hours as needed for wheezing or shortness of breath., Disp: 1 Inhaler, Rfl: 2 .  azelastine (ASTELIN) 0.1 % nasal  spray, PLACE 2 SPRAYS INTO BOTH NOSTRILS 2 TIMES DAILY AS DIRECTED, Disp: 90 mL, Rfl: 0 .  benzonatate (TESSALON) 100 MG capsule, Take 1 capsule (100 mg total) by mouth 3 (three) times daily as needed., Disp: 30 capsule, Rfl: 1 .  blood glucose meter kit and supplies KIT, Test blood sugar once daily. Dx code: E11.9, Disp: 1 each, Rfl: 0 .  cetirizine (ZYRTEC) 10 MG tablet, Take 10 mg by mouth daily as needed for allergies. , Disp: , Rfl:  .  clindamycin (CLEOCIN) 300 MG capsule, Take 1 capsule (300 mg total) by mouth 3 (three) times daily., Disp: 30 capsule, Rfl: 0 .  eletriptan (RELPAX) 20 MG tablet, TAKE 1 TABLET BY  MOUTH AS NEEDED FOR MIGRAINE OR HEADACHE. MAY REPEAT IN 2 HOURS IF HEADACHE PERSISTS OR RECURS., Disp: 9 tablet, Rfl: 4 .  Erenumab-aooe (AIMOVIG) 70 MG/ML SOAJ, Inject 70 mg into the skin every 30 (thirty) days., Disp: 1 pen, Rfl: 11 .  levothyroxine (SYNTHROID, LEVOTHROID) 125 MCG tablet, Take 1 tablet (125 mcg total) by mouth daily before breakfast., Disp: 30 tablet, Rfl: 5 .  lidocaine (LIDODERM) 5 %, Place 1 patch onto the skin as needed (for pain). Remove & Discard patch within 12 hours or as directed by MD, Disp: , Rfl:  .  losartan (COZAAR) 50 MG tablet, Take 1 tablet (50 mg total) by mouth daily., Disp: 90 tablet, Rfl: 1 .  metaxalone (SKELAXIN) 800 MG tablet, Take 1 tablet (800 mg total) by mouth 3 (three) times daily., Disp: 90 tablet, Rfl: 0 .  metFORMIN (GLUCOPHAGE) 1000 MG tablet, TAKE 1 TABLET BY MOUTH 2 TIMES DAILY WITH A MEAL., Disp: 180 tablet, Rfl: 0 .  metoprolol succinate (TOPROL-XL) 25 MG 24 hr tablet, Take 1 tablet (25 mg total) by mouth every morning., Disp: 90 tablet, Rfl: 3 .  Mometasone Furoate 110 MCG/INH AEPB, 2 puffs twice daily. Rinse mouth out after each use., Disp: 1 Inhaler, Rfl: 1 .  montelukast (SINGULAIR) 10 MG tablet, TAKE 1 TABLET BY MOUTH AT BEDTIME., Disp: 90 tablet, Rfl: 3 .  Multiple Vitamins-Minerals (ICAPS AREDS 2) CAPS, Take by mouth., Disp: , Rfl:  .  MYRBETRIQ 50 MG TB24 tablet, , Disp: , Rfl: 11 .  NASONEX 50 MCG/ACT nasal spray, PLACE 2 SPRAYS INTO THE NOSE DAILY., Disp: 17 g, Rfl: 9 .  neomycin-polymyxin-hydrocortisone (CORTISPORIN) OTIC solution, Apply 2 drops to the ingrown toenail site twice daily. Cover with band-aid., Disp: 10 mL, Rfl: 0 .  nitrofurantoin (MACRODANTIN) 50 MG capsule, Take 50 mg by mouth at bedtime. , Disp: , Rfl:  .  NON FORMULARY, , Disp: , Rfl:  .  Olopatadine HCl 0.2 % SOLN, , Disp: , Rfl: 5 .  polyethylene glycol (MIRALAX / GLYCOLAX) packet, Take 17 g by mouth daily., Disp: , Rfl:  .  pravastatin (PRAVACHOL) 10 MG tablet,  Take 1 tablet (10 mg total) by mouth daily., Disp: 90 tablet, Rfl: 3 .  Probiotic CAPS, Take 1 capsule by mouth 2 (two) times daily with a meal., Disp: , Rfl:  .  sitaGLIPtin (JANUVIA) 100 MG tablet, Take 1 tablet (100 mg total) by mouth daily., Disp: 90 tablet, Rfl: 3 .  traZODone (DESYREL) 150 MG tablet, Take 200 mg by mouth at bedtime. , Disp: , Rfl:  .  zolpidem (AMBIEN) 5 MG tablet, Take one half tablet at night as needed for sleep, Disp: 30 tablet, Rfl: 1  Allergies  Allergen Reactions  . Ciprofloxacin Diarrhea  .  Other Other (See Comments)    Artificial sweetners  . Sulfa Antibiotics Hives   Review of Systems  All other systems reviewed and are negative.  Objective:   Vitals:   06/26/17 1235  BP: 125/70  Pulse: 69  Resp: 16   General AA&O x3. Normal mood and affect.  Vascular Dorsalis pedis and posterior tibial pulses  present 2+ bilaterally  Capillary refill normal to all digits. Pedal hair growth normal. Forefoot edema noted  Neurologic Epicritic sensation present bilaterally.  Dermatologic No open lesions. Interspaces clear of maceration.  Normal skin temperature and turgor. Painful ingrowing nail at lateral nail borders of the hallux nail left. Incisions over the left first metatarsal phalangeal joint medially and laterally. dorsal midfoot  Orthopedic: MMT 5/5 in dorsiflexion, plantarflexion, inversion, and eversion. Normal lower extremity joint ROM without pain or crepitus.    Assessment & Plan:  Patient was evaluated and treated and all questions answered.  Ingrown Nail, left -Patient elects to proceed with ingrown toenail removal today -Ingrown nail excised. See procedure note. -Educated on post-procedure care including soaking. Written instructions provided. -Patient to follow up in 2 weeks for nail check.  Procedure: Excision of Ingrown Toenail Location: Left 1st toe lateral nail borders. Anesthesia: Lidocaine 1% plain; 1.2m and Marcaine 0.5% plain;  1.553m digital block. Skin Prep: Alcohol. Dressing: Silvadene; telfa; dry, sterile, compression dressing. Technique: Following skin prep, the toe was exsanguinated and a tourniquet was secured at the base of the toe. The affected nail border was freed, split with a nail splitter, and excised. Chemical matrixectomy was then performed with phenol and irrigated out with alcohol. The tourniquet was then removed and sterile dressing applied. Disposition: Patient tolerated procedure well. Patient to return in 2 weeks for follow-up.    Status post bunion correction left foot -Discussed swelling can last for up to a year after surgrery.  Educated on edema reduction techniques.  Return in about 2 weeks (around 07/10/2017) for Nail Check.

## 2017-07-07 MED FILL — AIMOVIG 70 MG/ML SOAJ: 70 | 30 days supply | Qty: 1 | Fill #0

## 2017-07-08 ENCOUNTER — Other Ambulatory Visit: Payer: Self-pay | Admitting: Family Medicine

## 2017-07-08 ENCOUNTER — Encounter: Payer: Self-pay | Admitting: Neurology

## 2017-07-08 DIAGNOSIS — E039 Hypothyroidism, unspecified: Secondary | ICD-10-CM

## 2017-07-08 MED FILL — NITROFURANTOIN MCR 50 MG CA: 50 | 90 days supply | Qty: 90 | Fill #0

## 2017-07-10 ENCOUNTER — Ambulatory Visit: Payer: 59 | Admitting: Podiatry

## 2017-07-10 MED FILL — LEVOTHYROXINE 125 MCG TABLE: 125 | 30 days supply | Qty: 30 | Fill #0

## 2017-07-13 ENCOUNTER — Other Ambulatory Visit: Payer: Self-pay | Admitting: Emergency Medicine

## 2017-07-13 MED ORDER — SITAGLIPTIN PHOSPHATE 100 MG PO TABS: 100.0000 mg | ORAL_TABLET | Freq: Every day | ORAL | 3 refills | Status: DC

## 2017-07-14 MED FILL — JANUVIA 100 MG TABLET: 100 | 60 days supply | Qty: 60 | Fill #0

## 2017-07-22 ENCOUNTER — Encounter: Payer: Self-pay | Admitting: Neurology

## 2017-07-22 ENCOUNTER — Ambulatory Visit: Payer: 59 | Admitting: Neurology

## 2017-07-22 ENCOUNTER — Telehealth: Payer: Self-pay | Admitting: Neurology

## 2017-07-22 VITALS — BP 132/67 | HR 74 | Wt 180.0 lb

## 2017-07-22 DIAGNOSIS — G43711 Chronic migraine without aura, intractable, with status migrainosus: Secondary | ICD-10-CM

## 2017-07-22 DIAGNOSIS — G4733 Obstructive sleep apnea (adult) (pediatric): Secondary | ICD-10-CM | POA: Diagnosis not present

## 2017-07-22 MED ORDER — ERENUMAB-AOOE 70 MG/ML ~~LOC~~ SOAJ: 140.0000 mg | SUBCUTANEOUS | 11 refills | Status: DC

## 2017-07-22 MED FILL — AIMOVIG 140 DOSE 70 MG/ML S: 70 | 30 days supply | Qty: 2 | Fill #0

## 2017-07-22 NOTE — Telephone Encounter (Signed)
Per Dr Dohmeier's instructions she would like to try the patient with samples at the 140mg  dose of the Aimovig. I removed 2 samples from the fridge. I went to give the first sample in the right anterior thigh and the medication didn't inject. I proceeded to give the 2nd sample and it did go through into the right anterior thigh. ( Lot # [id number] and exp 07/20, [place] 23536-144-31). I went to get the next sample to equal the total 140mg  dose and the 3rd sample also did not work. Patient will pick her script up for her original 1 dose and just give that to herself at home and still equal the 140mg  dose. I have messaged our contact person to [place] to question the 2 samples I have that didn't work.

## 2017-07-22 NOTE — Progress Notes (Signed)
Provider:  Larey Seat, M D  Referring Provider: Darreld Mclean, MD Primary Care Physician:  Darreld Mclean, MD  Chief Complaint  Patient presents with  . Follow-up    pt alone, rm 10. pt states after the 4th shot there was 10 days without a migraine but then started back up. pt states that she is just on the 1 pen a month and they have decreased  from 20 to 17.      HPI:  Debbie Hayes is a 66 y.o. female , seen here as a referral from Dr. Lorelei Pont , now for migraine headache evaluation.   Interval history from 22 July 2017.  I have the pleasure of meeting with Debbie Hayes today was first receiving an injection with my moving in August 2018, our expectation was a continuous decline in headache intensity and frequency with a peak after 3 months.  She enjoyed a 10-day headache free.  Last month but then again developed headaches to a frequency 13 a month on average.  While she had up to 17 or 18 headaches before this treatment was initiated this is a rather marginal response however in the months of September she had only 9 severe migraines, and I do think that there is a gradual response still possible.  I would like for her to have another injection today and we could double the dose or switch to another preparation was in the same substance group.  My goal is still for the patient be headache free at a 12 monthsmark.   August 2018, CD Debbie Hayes has been successfully reducing her AHI with the dental treatment guided by Dr. Toy Cookey, she is currently at an AHI of 13 this is the  5th adjustment for her straps- 7 today she got it she has wanted every night and she has not developed TMJ pain or clicks. In terms of apnea control I think this works well for her I hope that she will reach an AHI of 5 or below but there is already a significant reduction in comparison to her baseline. She would like to have a new evaluation for migraines today. Headaches occur 17-20 times per  month over the last 12 months. Headaches last multiple hours, some will improve without medication most will not, and on occasion there will be a 24-hour headache resulting. She [facility] Doctor Domingo Cocking, but her last Botox treatments did not work and shut so she stopped going about 4 years ago., She still has Imitrex for up to twice a week.  And she is allowed to take a Tylenol or nonsteroidal for 3 days a week. She is on Toprol, and still has headaches, failed Topiramate in the past,still needed zofran.  She has severe photophobia.  She has not been on Depakote, Zonegran. CBG treatment to be initiated.      Chief complaint according to patient : The patient has experienced for the second time in 4 years left facial dysesthesias this time on March 27, a feeling as if a liquid was dropping down the face from the left temple. This dysesthesia lasted for about 12 hours and returned every other day. She did not have motor weakness with it the patient has a history of steroid-induced diabetes, history of thyroid cancer, surgical hypothyroidism, gastroesophageal reflux disease, chronic migraine, fibromyalgia hyperlipidemia and TMJ syndrome. To treat TMJ, she wears a mouthguard at night she also has a history of bruxism  which is prevented by the dental device. The emergency room medical records did indicate that the patient underwent an MRI of the brain which was entirely normal, that she had an EKG which was normal, and multiple laboratory tests. Medical history : 4 years ago the patient had dysesthesias at the left upper lip, returning for several months. About 19 years ago she had left-sided facial zoster. 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 She expects a new (fourth) grandchild to be born early 2018.  One daughter has Downs syndrome.  She has never used any kind of tobacco product, she rarely drinks wine, perhaps 1  glass every other month. She gave up caffeine 4 years ago when her migraines became chronic. She received Botox injections for the treatment of migraine.  [name] reports that she has had a recurrence of migraines which used to be treated with Botox injections. She is concerned that there may be a correlation to her sleep pattern. Usually she sleeps between midnight and 8 AM 8 hours each night. She was diagnosed with fibromyalgia in the early 1990s, and her sleep pattern changed with the fibromyalgia pain. Sleep was less restoring and very fragmented. Since retirement she takes usually at 20 minutes after lunch nap which allows her to stay alert and awake until midnight. The late hours of the day otherwise she can share with her husband. Her husband works now with [employer].   She reports that once she is in bed at midnight and she will instantly fall asleep. She will stay asleep throughout the night except for one bathroom break. She will use one pillow only, usually sleeps in supine position. The bedroom is cool, quiet and dark she shares a bed with her husband. For background noise the family will be running.no TV . Her husband uses CPAP. He noted her to snore, but he hasn't noted apnea, only some PLMs.   Last visit note CD:  Debbie Hayes, a pediatric nurse, was able to give me a very detailed description of the sensory abnormalities that led to her last emergency room visit. 4 years ago she had dysesthesias of the left face centered at the left upper lip now at a Zygomatic arch. She also took a couple of pictures showing that her left upper lip seemed to be slimmer than her right but there is no obliteration of the nasolabial fold. Based on this I do not think that this is a facial nerve involvement but the middle branch of the trigeminal nerve. 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 I like for her to continue her baby aspirin regimen, as she is diabetic I would not like to treat her with steroids, there is no report of taking acyclovir years after visible skin lesions have disappeared. I think it is likely that she will have intermittently every couple of years or so one of the spells. I would like for her to go to the emergency room only if she has a focal neurologic weakness change in her speech, swallowing or taste.    Review of Systems:   Out of a complete 14 system review, the patient complains of only the following symptoms, and all other reviewed systems are negative.recurrent dyseasthesias of the left face- only in the middle branch. Which was affected by zoster.  Snoring.  Migraine 13 a month. 07-22-2017, last HST from dentist AHI 12.5  Social History   Socioeconomic History  . Marital status: Married    Spouse name: Not on file  . Number of children: Not on file  . Years of education: Not on file  . Highest education level: Not on file  Social Needs  . Financial resource strain: Not on file  . Food insecurity - worry: Not on file  . Food insecurity - inability: Not on file  . Transportation needs - medical: Not on file  . Transportation needs - non-medical: Not on file  Occupational History  . Not on file  Tobacco Use  . Smoking status: Never Smoker  . Smokeless tobacco: Never Used  Substance and Sexual Activity  . Alcohol use: Yes    Comment: RARE  . Drug use: No  . Sexual activity: Not on file  Other Topics Concern  . Not on file  Social History Narrative  . Not on file    Family History  Problem Relation Age of Onset  . Colon polyps Sister 24  . Alzheimer's disease Mother   . Heart disease Father   . Cirrhosis Father   . Heart attack Father   . Macular degeneration Father   . Colon cancer Neg Hx     Past Medical History:  Diagnosis Date  . Allergy   . Anxiety   . Cataract   . Chronic  migraine BOTOX INJECTION EVERY 3 MONTHS  . Chronic pain in right shoulder   . Constipation   . Diabetes mellitus   . Disorder of inner ear CAUSES VERTIGO OCCASIONALLY  . Fibromyalgia   . GERD (gastroesophageal reflux disease)   . Hemorrhoid   . History of bladder infections   . History of thyroid cancer 2009  S/P TOTAL THYROIDECTOMY AND RADIATION  . Hyperlipidemia   . Hypertension CARDIOLOGIST- DR Wynonia Lawman- WILL REQUEST LATE NOTE   DENIES S & S  . Hypothyroidism   . Insomnia   . Left shoulder pain   . Macular degeneration   . Normal nuclear stress test 01-25-2009  . OA (osteoarthritis) JOINTS  . PONV (postoperative nausea and vomiting) SEVERE  . Rotator cuff disorder LEFT SHOULDER RTC IMPINGMENT  . Sleep apnea in adult 06/26/2017  . Spondylolisthesis of lumbar region   . 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 Wears glasses     Past Surgical History:  Procedure Laterality Date  . BILATERAL CARPAL TUNNEL RELEASE  1994  . BILATERAL ELBOW SURG.  1999  . BILATERAL SALPINGOOPHECTOMY  1993   POST-OP URETER REPAIR 12 DAYS AFTER   . FOOT ARTHRODESIS Left 07/10/2016   Procedure: LEFT 2ND TARSAL METATARSAL ARTHRODESIS  GASTROC RECESSION LEFT LAPIDUS MODIFIED MCBRIDE BUIONECTOMY;  Surgeon: Wylene Simmer, MD;  Location: [facility];  Service: Orthopedics;  Laterality: Left;  Marland Kitchen GASTROC RECESSION EXTREMITY Left 07/10/2016   Procedure: GASTROC RECESSION EXTREMITY;  Surgeon: Wylene Simmer, MD;  Location: [facility];  Service: Orthopedics;  Laterality: Left;  . LEFT THUMB JOINT REPLACEMENT  2005   RIGHT DONE IN 2004  . RIGHT KNEE ARTHROSCOPY  X2  BEFORE 2011  . RIGHT KNEE ARTHROSCOPY/ PARTIAL LATERAL MENISECTOMY/ TRICOMPARTMENT CHONDROPLASTY/ DECOMPRESSION CYST  10-26-2009  . RIGHT KNEE CLOSED MANIPULATION  09-11-2010  . RIGHT SHOULDER ARTHROSCOPY  2010  &  2004  . TIBIA CYST REMOVED AND ORIF LEG FX  1958  . TONSILLECTOMY  1968  . TOTAL KNEE ARTHROPLASTY  07-16-2010   RIGHT  . TOTAL THYROIDECTOMY  2009   CANCER  (POST-OP BLEED)  AND RADIATION TX  . trigger fingers Right 2013   3rd and 4th fingers  . VAGINAL HYSTERECTOMY  1990    Current Outpatient Medications  Medication Sig Dispense Refill  . acetaminophen (TYLENOL) 500 MG tablet Take 500 mg by mouth every 6 (six) hours as needed.    Marland Kitchen azelastine (ASTELIN) 0.1 % nasal spray PLACE 2 SPRAYS INTO BOTH NOSTRILS 2 TIMES DAILY AS DIRECTED 90 mL 0  . blood glucose meter kit and supplies KIT Test blood sugar once daily. Dx code: E11.9 1 each 0  . cetirizine (ZYRTEC) 10 MG tablet Take 10 mg by mouth daily as needed for allergies.     Marland Kitchen eletriptan (RELPAX) 20 MG tablet TAKE 1 TABLET BY MOUTH AS NEEDED FOR MIGRAINE OR HEADACHE. MAY REPEAT IN 2 HOURS IF HEADACHE PERSISTS OR RECURS. 9 tablet 4  . Erenumab-aooe (AIMOVIG) 70 MG/ML SOAJ Inject 70 mg into the skin every 30 (thirty) days. 1 pen 11  . levothyroxine (SYNTHROID, LEVOTHROID) 125 MCG tablet TAKE 1 TABLET BY MOUTH ONCE DAILY BEFORE BREAKFAST 30 tablet 5  . lidocaine (LIDODERM) 5 % Place 1 patch onto the skin as needed (for pain). Remove & Discard patch within 12 hours or as directed by MD    . losartan (COZAAR) 50 MG tablet Take 1 tablet (50 mg total) by mouth daily. 90 tablet 1  . metaxalone (SKELAXIN) 800 MG tablet Take 1 tablet (800 mg total) by mouth 3 (three) times daily. 90 tablet 0  . metFORMIN (GLUCOPHAGE) 1000 MG tablet TAKE 1 TABLET BY MOUTH 2 TIMES DAILY WITH A MEAL. 180 tablet 0  . metoprolol succinate (TOPROL-XL) 25 MG 24 hr tablet Take 1 tablet (25 mg total) by mouth every morning. 90 tablet 3  . montelukast (SINGULAIR) 10 MG tablet TAKE 1 TABLET BY MOUTH AT BEDTIME. 90 tablet 3  . Multiple Vitamins-Minerals (ICAPS AREDS 2) CAPS Take by mouth.    Marland Kitchen NASONEX 50 MCG/ACT nasal spray PLACE 2 SPRAYS INTO THE NOSE DAILY. 17 g 9  . nitrofurantoin (MACRODANTIN) 50 MG capsule Take 50 mg by mouth at bedtime.     . NON FORMULARY     .  Olopatadine HCl 0.2 % SOLN   5  . polyethylene glycol (MIRALAX / GLYCOLAX) packet Take 17 g by mouth daily.    . pravastatin (PRAVACHOL) 10 MG tablet Take 1 tablet (10 mg total) by mouth daily. 90 tablet 3  . Probiotic CAPS Take 1 capsule by mouth 2 (two) times daily with a meal.    . sitaGLIPtin (JANUVIA) 100 MG tablet Take 1 tablet (100 mg total) by mouth daily. 90 tablet 3  . traZODone (DESYREL) 150 MG tablet Take 200 mg by mouth at bedtime.     Marland Kitchen zolpidem (AMBIEN) 5 MG tablet Take one half tablet at night as needed for sleep 30 tablet 1   No current facility-administered medications for this visit.     Allergies as of 07/22/2017 - Review Complete 07/22/2017  Allergen Reaction Noted  . Ciprofloxacin Diarrhea 07/10/2016  . Other Other (See Comments) 08/01/2014  . Sulfa antibiotics Hives 09/05/2011    Vitals: BP 132/67   Pulse 74   Wt 180 lb (81.6 kg)   BMI 27.37 kg/m  Last Weight:  Wt Readings from Last 1 Encounters:  07/22/17 180 lb (81.6 kg)   QIO:NGEX mass index is 27.37 kg/m.  Last Height:   Ht Readings from Last 1 Encounters:  06/16/17 _0  (1.727 m)    Physical exam:  General: The patient is awake, alert and appears not in acute distress. The patient is well groomed. Neck circumference is 14.75 inches, Mallampati grade 2 without lateral restriction. Neurologic exam : The patient is awake and alert, oriented to place and time.   Memory subjective described as intact.  Attention span & concentration ability appears normal.  Speech is fluent, without dysarthria, mild  dysphonia and no aphasia.  Mood and affect are appropriate.  Cranial nerves: Pupils are equal and briskly reactive to light. .Hearing to finger rub intact.   Facial sensation the patient describes a numb spot lateral to the left outer angle of the eye and radiating over the zygomatic arch. Never cross the midline. 4 years ago she had numbness on the left upper lip. motor strength is symmetric and  tongue and uvula move midline. Shoulder shrug was symmetrical.  Deep tendon reflexes: in the  upper and lower extremities are symmetric and intact. Babinski maneuver response is downgoing.   1) OSA on dental device. HST by dental provider.   2) Migraine chronic, 13 a month on aimovig - today ,  I will use 140 mg instead of 70, if no success in 30 days, will change to different prescription.  Patient will inform me by phone. CD   I spent more than15 minutes of face to face time with the patient. Greater than 50% of time was spent in counseling and coordination of care. We have discussed the diagnosis and differential and I answered the patient's questions.    2 Samples of Aimovig were given today- she will have another month to keep a diary.  Asencion Partridge Gonzalo Waymire MD  07/22/2017   CC: Darreld Mclean, Md [address], [place] [address]

## 2017-07-22 NOTE — Addendum Note (Signed)
Addended by: Larey Seat on: 07/22/2017 11:45 AM   Modules accepted: Orders

## 2017-08-01 ENCOUNTER — Encounter: Payer: Self-pay | Admitting: Neurology

## 2017-08-03 ENCOUNTER — Other Ambulatory Visit: Payer: Self-pay | Admitting: Neurology

## 2017-08-03 MED ORDER — FREMANEZUMAB-VFRM 225 MG/1.5ML ~~LOC~~ SOSY: 1.0000 | PREFILLED_SYRINGE | SUBCUTANEOUS | 11 refills | Status: DC

## 2017-08-03 MED FILL — ELETRIPTAN HBR 20 MG TABLET: 20 | 30 days supply | Qty: 6 | Fill #2

## 2017-08-03 NOTE — Telephone Encounter (Signed)
Change to ajovy

## 2017-08-05 MED FILL — AJOVY 225 MG/1.5ML SOSY: 225 | 30 days supply | Qty: 2 | Fill #0

## 2017-08-10 ENCOUNTER — Other Ambulatory Visit: Payer: Self-pay | Admitting: Physician Assistant

## 2017-08-10 ENCOUNTER — Other Ambulatory Visit: Payer: Self-pay | Admitting: Family Medicine

## 2017-08-10 DIAGNOSIS — E088 Diabetes mellitus due to underlying condition with unspecified complications: Secondary | ICD-10-CM

## 2017-08-10 DIAGNOSIS — J309 Allergic rhinitis, unspecified: Secondary | ICD-10-CM

## 2017-08-10 DIAGNOSIS — E782 Mixed hyperlipidemia: Secondary | ICD-10-CM

## 2017-08-10 MED FILL — traZODone HCL 100 MG TABS: 100 | 90 days supply | Qty: 225 | Fill #3

## 2017-08-10 MED FILL — LEVOTHYROXINE 125 MCG TABLE: 125 | 90 days supply | Qty: 90 | Fill #1

## 2017-08-10 MED FILL — LOSARTAN POTASSIUM 50 MG TA: 50 | 90 days supply | Qty: 90 | Fill #1

## 2017-08-10 MED FILL — METOPROLOL SUCC ER 25 MG TA: 25 | 90 days supply | Qty: 90 | Fill #2

## 2017-08-11 ENCOUNTER — Telehealth: Payer: Self-pay | Admitting: Neurology

## 2017-08-11 DIAGNOSIS — Z6828 Body mass index (BMI) 28.0-28.9, adult: Secondary | ICD-10-CM | POA: Diagnosis not present

## 2017-08-11 DIAGNOSIS — M545 Low back pain: Secondary | ICD-10-CM | POA: Diagnosis not present

## 2017-08-11 DIAGNOSIS — M4316 Spondylolisthesis, lumbar region: Secondary | ICD-10-CM | POA: Diagnosis not present

## 2017-08-11 DIAGNOSIS — I10 Essential (primary) hypertension: Secondary | ICD-10-CM | POA: Diagnosis not present

## 2017-08-11 MED FILL — AZELASTINE 0.1% (137 MCG) S: 0.1 | 75 days supply | Qty: 90 | Fill #0

## 2017-08-11 MED FILL — METHYLPREDNISOLONE 4 MG TAB: 4 | 6 days supply | Qty: 21 | Fill #0

## 2017-08-11 NOTE — Telephone Encounter (Signed)
Eulas Post from [facility] is asking to be called back concerning the Ajovy for pt.  Please call to [phone] ref [id number]

## 2017-08-12 ENCOUNTER — Telehealth: Payer: Self-pay | Admitting: Neurology

## 2017-08-12 NOTE — Telephone Encounter (Signed)
PA completed through cover my meds for ajovy for [employer].  KEY [id number]. Can take up to 5 days to hear back.

## 2017-08-17 NOTE — Telephone Encounter (Signed)
PA pending

## 2017-08-18 ENCOUNTER — Other Ambulatory Visit: Payer: Self-pay | Admitting: Family Medicine

## 2017-08-18 MED FILL — PRAVASTATIN NA 10 MG TAB: 10 | 90 days supply | Qty: 90 | Fill #0

## 2017-08-18 MED FILL — metFORMIN HCL 1000 MG TABS: 1000 | 30 days supply | Qty: 60 | Fill #0

## 2017-08-18 MED FILL — MONTELUKAST SOD 10 MG TAB: 10 | 90 days supply | Qty: 90 | Fill #2

## 2017-08-19 NOTE — Telephone Encounter (Signed)
Requesting: zolpidem (AMBIEN) 5 MG tablet Contract UDS Last OV: 05/21/17 Last Refill: 10/05/16  Please Advise

## 2017-08-19 NOTE — Telephone Encounter (Signed)
Debbie Hayes has been approved from 08/05/17 to 02/01/18. Ref# [id number]

## 2017-08-25 ENCOUNTER — Encounter: Payer: Self-pay | Admitting: Neurology

## 2017-08-27 MED FILL — ELETRIPTAN HBR 20 MG TABLET: 20 | 30 days supply | Qty: 6 | Fill #3

## 2017-08-31 MED FILL — AJOVY 225 MG/1.5ML SOSY: 225 | 30 days supply | Qty: 2 | Fill #1

## 2017-09-04 MED FILL — ZOLPIDEM TARTRATE 5 MG TABL: 5 | 60 days supply | Qty: 30 | Fill #0

## 2017-09-04 MED FILL — JANUVIA 100 MG TABLET: 100 | 60 days supply | Qty: 60 | Fill #1

## 2017-09-10 DIAGNOSIS — H353131 Nonexudative age-related macular degeneration, bilateral, early dry stage: Secondary | ICD-10-CM | POA: Diagnosis not present

## 2017-09-15 ENCOUNTER — Encounter: Payer: Self-pay | Admitting: Family Medicine

## 2017-09-15 DIAGNOSIS — R8290 Unspecified abnormal findings in urine: Secondary | ICD-10-CM

## 2017-09-17 ENCOUNTER — Other Ambulatory Visit (INDEPENDENT_AMBULATORY_CARE_PROVIDER_SITE_OTHER): Payer: 59

## 2017-09-17 DIAGNOSIS — R8290 Unspecified abnormal findings in urine: Secondary | ICD-10-CM

## 2017-09-18 ENCOUNTER — Encounter: Payer: Self-pay | Admitting: Internal Medicine

## 2017-09-18 LAB — URINE CULTURE
MICRO NUMBER:: 90230625
SPECIMEN QUALITY:: ADEQUATE

## 2017-09-19 ENCOUNTER — Encounter: Payer: Self-pay | Admitting: Family Medicine

## 2017-09-21 ENCOUNTER — Ambulatory Visit: Payer: 59 | Admitting: Family Medicine

## 2017-09-21 MED FILL — ELETRIPTAN HBR 20 MG TABLET: 20 | 30 days supply | Qty: 6 | Fill #4

## 2017-09-21 MED FILL — metFORMIN HCL 1000 MG TABS: 1000 | 30 days supply | Qty: 60 | Fill #1

## 2017-09-23 ENCOUNTER — Other Ambulatory Visit: Payer: Self-pay

## 2017-09-23 MED ORDER — FREESTYLE LITE DEVI: 0 refills | Status: AC

## 2017-09-23 MED ORDER — FREESTYLE LANCETS MISC: 12 refills | Status: AC

## 2017-09-23 MED ORDER — GLUCOSE BLOOD VI STRP: ORAL_STRIP | 12 refills | Status: DC

## 2017-09-23 MED FILL — FREESTYLE LITE TEST STRIP: 50 days supply | Qty: 100 | Fill #0

## 2017-09-23 MED FILL — FREESTYLE LITE METER: 20 days supply | Qty: 1 | Fill #0

## 2017-09-23 MED FILL — FREESTYLE LANCETS: 50 days supply | Qty: 100 | Fill #0

## 2017-09-24 ENCOUNTER — Encounter: Payer: Self-pay | Admitting: Family Medicine

## 2017-09-25 NOTE — Telephone Encounter (Signed)
Message copied to Lydia's chart and forwarded to covering PCP for review.

## 2017-09-28 ENCOUNTER — Encounter: Payer: Self-pay | Admitting: Family Medicine

## 2017-09-28 ENCOUNTER — Other Ambulatory Visit: Payer: Self-pay | Admitting: Family Medicine

## 2017-09-28 DIAGNOSIS — L03012 Cellulitis of left finger: Secondary | ICD-10-CM

## 2017-09-28 MED ORDER — CEPHALEXIN 500 MG PO CAPS: 500.0000 mg | ORAL_CAPSULE | Freq: Three times a day (TID) | ORAL | 0 refills | Status: DC

## 2017-09-28 MED FILL — METAXALONE 800 MG TABLET: 800 | 30 days supply | Qty: 90 | Fill #0

## 2017-09-28 MED FILL — NITROFURANTOIN MCR 50 MG CA: 50 | 60 days supply | Qty: 60 | Fill #1

## 2017-09-28 MED FILL — AJOVY 225 MG/1.5ML SOSY: 225 | 30 days supply | Qty: 2 | Fill #2

## 2017-09-28 NOTE — Telephone Encounter (Signed)
Pt accompanied her daughter into clinic today. She had poked her finger on a sewing needle last week and had mild cellulitis of the left thumb.  Called in keflex for her, she will let me know if not improved Tetanus is utd

## 2017-09-29 ENCOUNTER — Encounter: Payer: Self-pay | Admitting: Family Medicine

## 2017-09-29 MED FILL — CEPHALEXIN 500 MG CAPSULE: 500 | 7 days supply | Qty: 21 | Fill #0

## 2017-10-05 ENCOUNTER — Encounter: Payer: Self-pay | Admitting: Neurology

## 2017-10-05 MED FILL — ELETRIPTAN HBR 20 MG TABLET: 20 | 30 days supply | Qty: 6 | Fill #5

## 2017-10-13 ENCOUNTER — Encounter: Payer: Self-pay | Admitting: Family Medicine

## 2017-10-15 MED FILL — metFORMIN HCL 1000 MG TABS: 1000 | 30 days supply | Qty: 60 | Fill #2

## 2017-10-19 DIAGNOSIS — M25512 Pain in left shoulder: Secondary | ICD-10-CM | POA: Insufficient documentation

## 2017-10-19 DIAGNOSIS — M19012 Primary osteoarthritis, left shoulder: Secondary | ICD-10-CM | POA: Diagnosis not present

## 2017-10-21 ENCOUNTER — Ambulatory Visit: Payer: 59 | Admitting: Neurology

## 2017-10-21 ENCOUNTER — Telehealth: Payer: Self-pay | Admitting: Neurology

## 2017-10-21 ENCOUNTER — Other Ambulatory Visit: Payer: Self-pay | Admitting: Neurology

## 2017-10-21 ENCOUNTER — Encounter: Payer: Self-pay | Admitting: Neurology

## 2017-10-21 ENCOUNTER — Other Ambulatory Visit: Payer: Self-pay | Admitting: Family Medicine

## 2017-10-21 VITALS — BP 118/76 | HR 72 | Ht 68.0 in | Wt 182.0 lb

## 2017-10-21 DIAGNOSIS — G43001 Migraine without aura, not intractable, with status migrainosus: Secondary | ICD-10-CM

## 2017-10-21 DIAGNOSIS — G43701 Chronic migraine without aura, not intractable, with status migrainosus: Secondary | ICD-10-CM

## 2017-10-21 MED ORDER — GALCANEZUMAB-GNLM 120 MG/ML ~~LOC~~ SOAJ: 2.0000 "pen " | Freq: Once | SUBCUTANEOUS | 0 refills | Status: AC

## 2017-10-21 MED ORDER — GALCANEZUMAB-GNLM 120 MG/ML ~~LOC~~ SOAJ: 1.0000 "pen " | SUBCUTANEOUS | 11 refills | Status: DC

## 2017-10-21 NOTE — Progress Notes (Signed)
Provider:  Larey Hayes, M D  Referring Provider: Darreld Mclean, MD Primary Care Physician:  Debbie Mclean, MD  Chief Complaint  Patient presents with  . Follow-up    pt with husband, rm 47. pt states that with the Ajovy she still has had 10 migraines since given last.      HPI:  Debbie Hayes is a 67 y.o. female , seen here as a referral from Dr. Lorelei Hayes , now for migraine headache evaluation.   10-21-2016, I have the pleasure of seeing Debbie Hayes today in the presence of her husband Dr. Bertram Hayes, she has not had success with a Mobic nor a Jyothi.  I move it was given on 26 December here in our office, a [name] was started January 10 and yet had overlooked the following 30 days not reduced her headaches.  She describes that she uses twice a week a triptan . She is a diabetic.  She is reluctant to use steroids, but wold agree to a depakote infusion.  She did not tolerate Effexor nor Topiramate well. We will use two Emgaiity shots - and she may well need iv Depakote when in acute pain. Today I will give her 2 doses of EMGALITY. I reviewed her headache journal. .     Interval history from 22 July 2017.  I have the pleasure of meeting with Debbie Hayes today was first receiving an injection with my moving in August 2018, our expectation was a continuous decline in headache intensity and frequency with a peak after 3 months.  She enjoyed a 10-day headache free.  Last month but then again developed headaches to a frequency 13 a month on average.  While she had up to 17 or 18 headaches before this treatment was initiated this is a rather marginal response however in the months of September she had only 9 severe migraines, and I do think that there is a gradual response still possible.  I would like for her to have another injection today and we could double the dose or switch to another preparation was in the same substance group.  My goal is still for the  patient be headache free at a 12 monthsmark.   August 2018, CD  [name] has been successfully reducing her AHI with the dental treatment guided by Dr. Toy Hayes, she is currently at an AHI of 13 this is the  5th adjustment for her straps- 7 today she got it she has wanted every night and she has not developed TMJ pain or clicks. In terms of apnea control I think this works well for her I hope that she will reach an AHI of 5 or below but there is already a significant reduction in comparison to her baseline. She would like to have a new evaluation for migraines today. Headaches occur 17-20 times per month over the last 12 months. Headaches last multiple hours, some will improve without medication most will not, and on occasion there will be a 24-hour headache resulting. [facility] Doctor Debbie Hayes, but her last Botox treatments did not work and shut so she stopped going about 4 years ago., She still has Imitrex for up to twice a week.  And she is allowed to take a Tylenol or nonsteroidal for 3 days a week. She is on Toprol, and still has headaches, failed Topiramate in the past,still needed zofran.  She has severe photophobia.  She  has not been on Depakote, Zonegran. CBG treatment to be initiated.      Chief complaint according to patient : The patient has experienced for the second time in 4 years left facial dysesthesias this time on March 27, a feeling as if a liquid was dropping down the face from the left temple. This dysesthesia lasted for about 12 hours and returned every other day. She did not have motor weakness with it the patient has a history of steroid-induced diabetes, history of thyroid cancer, surgical hypothyroidism, gastroesophageal reflux disease, chronic migraine, fibromyalgia hyperlipidemia and TMJ syndrome. To treat TMJ, she wears a mouthguard at night she also has a history of bruxism which is prevented by the dental device. The emergency room  medical records did indicate that the patient underwent an MRI of the brain which was entirely normal, that she had an EKG which was normal, and multiple laboratory tests. Medical history : 4 years ago the patient had dysesthesias at the left upper lip, returning for several months. About 19 years ago she had left-sided facial zoster. 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 She expects a new (fourth) grandchild to be born early 2018.  One daughter has Downs syndrome.  She has never used any kind of tobacco product, she rarely drinks wine, perhaps 1 glass every other month. She gave up caffeine 4 years ago when her migraines became chronic. She received Botox injections for the treatment of migraine.  [name] reports that she has had a recurrence of migraines which used to be treated with Botox injections. She is concerned that there may be a correlation to her sleep pattern. Usually she sleeps between midnight and 8 AM 8 hours each night. She was diagnosed with fibromyalgia in the early 1990s, and her sleep pattern changed with the fibromyalgia pain. Sleep was less restoring and very fragmented. Since retirement she takes usually at 20 minutes after lunch nap which allows her to stay alert and awake until midnight. The late hours of the day otherwise she can share with her husband. Her husband works now with [employer].   She reports that once she is in bed at midnight and she will instantly fall asleep. She will stay asleep throughout the night except for one bathroom break. She will use one pillow only, usually sleeps in supine position. The bedroom is cool, quiet and dark she shares a bed with her husband. For background noise the family will be running.no TV . Her husband uses CPAP. He noted her to snore, but he hasn't noted apnea, only some PLMs.   Last visit note CD:  Debbie Hayes, was able to give me a very detailed  description of the sensory abnormalities that led to her last emergency room visit. 4 years ago she had dysesthesias of the left face centered at the left upper lip now at a Zygomatic arch. She also took a couple of pictures showing that her left upper lip seemed to be slimmer than her right but there is no obliteration of the nasolabial fold. Based on this I do not think that this is a facial nerve involvement but the middle branch of the trigeminal nerve. 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 I like for her to continue her baby aspirin regimen, as she  is diabetic I would not like to treat her with steroids, there is no report of taking acyclovir years after visible skin lesions have disappeared. I think it is likely that she will have intermittently every couple of years or so one of the spells. I would like for her to go to the emergency room only if she has a focal neurologic weakness change in her speech, swallowing or taste.    Review of Systems:   Out of a complete 14 system review, the patient complains of only the following symptoms, and all other reviewed systems are negative.recurrent dyseasthesias of the left face- only in the middle branch. Which was affected by zoster. Snoring.  Migraine 13 a month. 07-22-2017, last HST from dentist AHI 12.5  Social History   Socioeconomic History  . Marital status: Married    Spouse name: Not on file  . Number of children: Not on file  . Years of education: Not on file  . Highest education level: Not on file  Occupational History  . Not on file  Social Needs  . Financial resource strain: Not on file  . Food insecurity:    Worry: Not on file    Inability: Not on file  . Transportation needs:    Medical: Not on file    Non-medical: Not on file  Tobacco Use  . Smoking status: Never Smoker  .  Smokeless tobacco: Never Used  Substance and Sexual Activity  . Alcohol use: Yes    Comment: RARE  . Drug use: No  . Sexual activity: Not on file  Lifestyle  . Physical activity:    Days per week: Not on file    Minutes per session: Not on file  . Stress: Not on file  Relationships  . Social connections:    Talks on phone: Not on file    Gets together: Not on file    Attends religious service: Not on file    Active member of club or organization: Not on file    Attends meetings of clubs or organizations: Not on file    Relationship status: Not on file  . Intimate partner violence:    Fear of current or ex partner: Not on file    Emotionally abused: Not on file    Physically abused: Not on file    Forced sexual activity: Not on file  Other Topics Concern  . Not on file  Social History Narrative  . Not on file    Family History  Problem Relation Age of Onset  . Colon polyps Sister 92  . Alzheimer's disease Mother   . Heart disease Father   . Cirrhosis Father   . Heart attack Father   . Macular degeneration Father   . Colon cancer Neg Hx     Past Medical History:  Diagnosis Date  . Allergy   . Anxiety   . Cataract   . Chronic migraine BOTOX INJECTION EVERY 3 MONTHS  . Chronic pain in right shoulder   . Constipation   . Diabetes mellitus   . Disorder of inner ear CAUSES VERTIGO OCCASIONALLY  . Fibromyalgia   . GERD (gastroesophageal reflux disease)   . Hemorrhoid   . History of bladder infections   . History of thyroid cancer 2009  S/P TOTAL THYROIDECTOMY AND RADIATION  . Hyperlipidemia   . Hypertension CARDIOLOGIST- DR Wynonia Lawman- WILL REQUEST LATE NOTE   DENIES S & S  . Hypothyroidism   . Insomnia   . Left  shoulder pain   . Macular degeneration   . Normal nuclear stress test 01-25-2009  . OA (osteoarthritis) JOINTS  . PONV (postoperative nausea and vomiting) SEVERE  . Rotator cuff disorder LEFT SHOULDER RTC IMPINGMENT  . Sleep apnea in adult 06/26/2017  .  Spondylolisthesis of lumbar region   . 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 Wears glasses     Past Surgical History:  Procedure Laterality Date  . BILATERAL CARPAL TUNNEL RELEASE  1994  . BILATERAL ELBOW SURG.  1999  . BILATERAL SALPINGOOPHECTOMY  1993   POST-OP URETER REPAIR 12 DAYS AFTER   . FOOT ARTHRODESIS Left 07/10/2016   Procedure: LEFT 2ND TARSAL METATARSAL ARTHRODESIS  GASTROC RECESSION LEFT LAPIDUS MODIFIED MCBRIDE BUIONECTOMY;  Surgeon: Wylene Simmer, MD;  Location: [place] [facility];  Service: Orthopedics;  Laterality: Left;  Marland Kitchen GASTROC RECESSION EXTREMITY Left 07/10/2016   Procedure: GASTROC RECESSION EXTREMITY;  Surgeon: Wylene Simmer, MD;  Location: [place];  Service: Orthopedics;  Laterality: Left;  . LEFT THUMB JOINT REPLACEMENT  2005   RIGHT DONE IN 2004  . RIGHT KNEE ARTHROSCOPY  X2  BEFORE 2011  . RIGHT KNEE ARTHROSCOPY/ PARTIAL LATERAL MENISECTOMY/ TRICOMPARTMENT CHONDROPLASTY/ DECOMPRESSION CYST  10-26-2009  . RIGHT KNEE CLOSED MANIPULATION  09-11-2010  . RIGHT SHOULDER ARTHROSCOPY  2010  &  2004  . TIBIA CYST REMOVED AND ORIF LEG FX  1958  . TONSILLECTOMY  1968  . TOTAL KNEE ARTHROPLASTY  07-16-2010   RIGHT  . TOTAL THYROIDECTOMY  2009   CANCER  (POST-OP BLEED)  AND RADIATION TX  . trigger fingers Right 2013   3rd and 4th fingers  . VAGINAL HYSTERECTOMY  1990    Current Outpatient Medications  Medication Sig Dispense Refill  . acetaminophen (TYLENOL) 500 MG tablet Take 500 mg by mouth every 6 (six) hours as needed.    Marland Kitchen azelastine (ASTELIN) 0.1 % nasal spray PLACE 2 SPRAYS INTO BOTH NOSTRILS 2 TIMES DAILY AS DIRECTED 90 mL 0  . blood glucose meter kit and supplies KIT Test blood sugar once daily. Dx code: E11.9 1 each 0  . Blood Glucose Monitoring Suppl (FREESTYLE LITE) DEVI Check blood sugar no more than twice daily 1 each 0  . cetirizine (ZYRTEC) 10 MG tablet Take 10 mg by mouth daily as needed for allergies.     Marland Kitchen eletriptan  (RELPAX) 20 MG tablet TAKE 1 TABLET BY MOUTH AS NEEDED FOR MIGRAINE OR HEADACHE. MAY REPEAT IN 2 HOURS IF HEADACHE PERSISTS OR RECURS. 9 tablet 4  . Fremanezumab-vfrm (AJOVY) 225 MG/1.5ML SOSY Inject 1 Syringe into the skin every 30 (thirty) days. 1 Syringe 11  . glucose blood (FREESTYLE LITE) test strip Check blood sugars no more than twice daily 100 each 12  . Lancets (FREESTYLE) lancets Check blood sugars no more than twice daily 100 each 12  . lidocaine (LIDODERM) 5 % Place 1 patch onto the skin as needed (for pain). Remove & Discard patch within 12 hours or as directed by MD    . losartan (COZAAR) 50 MG tablet Take 1 tablet (50 mg total) by mouth daily. 90 tablet 1  . metaxalone (SKELAXIN) 800 MG tablet Take 1 tablet (800 mg total) by mouth 3 (three) times daily. 90 tablet 0  . metFORMIN (GLUCOPHAGE) 1000 MG tablet TAKE 1 TABLET BY MOUTH TWICE DAILY WITH A MEAL 180 tablet 0  . metoprolol succinate (TOPROL-XL) 25 MG 24 hr tablet Take 1 tablet (25 mg total) by mouth  every morning. 90 tablet 3  . montelukast (SINGULAIR) 10 MG tablet TAKE 1 TABLET BY MOUTH AT BEDTIME. 90 tablet 3  . Multiple Vitamins-Minerals (ICAPS AREDS 2) CAPS Take by mouth.    Marland Kitchen NASONEX 50 MCG/ACT nasal spray PLACE 2 SPRAYS INTO THE NOSE DAILY. 17 g 9  . nitrofurantoin (MACRODANTIN) 50 MG capsule Take 50 mg by mouth at bedtime.     . NON FORMULARY     . polyethylene glycol (MIRALAX / GLYCOLAX) packet Take 17 g by mouth daily.    . Probiotic CAPS Take 1 capsule by mouth 2 (two) times daily with a meal.    . sitaGLIPtin (JANUVIA) 100 MG tablet Take 1 tablet (100 mg total) by mouth daily. 90 tablet 3  . traZODone (DESYREL) 150 MG tablet Take 200 mg by mouth at bedtime.     Marland Kitchen zolpidem (AMBIEN) 5 MG tablet TAKE 1/2 TABLET BY MOUTH AT BEDTIME AS NEEDED FOR SLEEP 30 tablet 1   No current facility-administered medications for this visit.     Allergies as of 10/21/2017 - Review Complete 10/21/2017  Allergen Reaction Noted  .  Ciprofloxacin Diarrhea 07/10/2016  . Other Other (See Comments) 08/01/2014  . Sulfa antibiotics Hives 09/05/2011    Vitals: BP 118/76   Pulse 72   Ht _0  (1.727 m)   Wt 182 lb (82.6 kg)   BMI 27.67 kg/m  Last Weight:  Wt Readings from Last 1 Encounters:  10/21/17 182 lb (82.6 kg)   NOI:BBCW mass index is 27.67 kg/m.     Last Height:   Ht Readings from Last 1 Encounters:  10/21/17 _1  (1.727 m)    Physical exam:  General: The patient is awake, alert and appears not in acute distress. The patient is well groomed. Neck circumference is 14.75 inches, Mallampati grade 2 without lateral restriction. Neurologic exam : The patient is awake and alert, oriented to place and time.   Memory subjective described as intact.  Mood and affect are appropriate.  Cranial nerves:Pupils are equal and briskly reactive to light.  Facial sensation the patient describes a numb spot lateral to the left outer angle of the eye and radiating over the zygomatic arch. Never cross the midline. 4 years ago she had numbness on the left upper lip. motor strength is symmetric and tongue and uvula move midline. Shoulder shrug was symmetrical.  Deep tendon reflexes: in the  upper and lower extremities are symmetric and intact. Babinski maneuver response is downgoing.   1) OSA on dental device. HST by dental provider. She loves her device. Dr Debbie Hayes   2) Migraine chronic, 13- 15  a month after ajovy and aimovig. Use. We will use two Emgaiity shots - and she may well need iv Depakote when in acute pain. Today I will give her 2 doses of EMGALITY. I reviewed her headache journal. .  I   I spent more than15 minutes of face to face time with the patient. Greater than 50% of time was spent in counseling and coordination of care. We have discussed the diagnosis and differential and I answered the patient's questions.    2 Sample prefilled syringes of Emgality  were given today-  autoinjector device -she will have  another month to keep a diary.  She will take off for [place] next week.   Debbie Partridge Azelia Reiger MD  10/21/2017   CC: Debbie Mclean, Md [address] [place], [place] [id number]

## 2017-10-21 NOTE — Telephone Encounter (Signed)
During apt today with Dr Brett Fairy the patient had her visit and states the Ajovy has not helped. Dr Brett Fairy would like to start [facility] for the patient and give a dose in the office today. I administered 2 injections of the Emgality in the office on the patients right side of stomach. LOT [id number] C EXP 01/2019. Order for the medication was sent to the patients pharmacy and her next dose will be due November 21 2017.

## 2017-10-22 ENCOUNTER — Telehealth: Payer: Self-pay | Admitting: Neurology

## 2017-10-22 MED FILL — ELETRIPTAN HBR 20 MG TABLET: 20 | 30 days supply | Qty: 6 | Fill #0

## 2017-10-22 NOTE — Telephone Encounter (Signed)
PA COMPLETED THROUGH COVER MY MEDS ON [employer].  ERQ:[id number] CAN TAKE UP TO 5 DAYS BEFORE HEARING BACK

## 2017-10-26 ENCOUNTER — Encounter: Payer: Self-pay | Admitting: Neurology

## 2017-10-26 NOTE — Telephone Encounter (Signed)
PA approved for 1 month 10/23/2017-11/22/2017. From [employer].

## 2017-11-09 ENCOUNTER — Encounter: Payer: Self-pay | Admitting: Family Medicine

## 2017-11-09 ENCOUNTER — Ambulatory Visit: Payer: 59 | Admitting: Family Medicine

## 2017-11-09 VITALS — BP 122/70 | HR 85 | Resp 16 | Ht 68.0 in | Wt 183.0 lb

## 2017-11-09 DIAGNOSIS — G894 Chronic pain syndrome: Secondary | ICD-10-CM | POA: Diagnosis not present

## 2017-11-09 DIAGNOSIS — M25552 Pain in left hip: Secondary | ICD-10-CM | POA: Diagnosis not present

## 2017-11-09 DIAGNOSIS — E119 Type 2 diabetes mellitus without complications: Secondary | ICD-10-CM | POA: Diagnosis not present

## 2017-11-09 DIAGNOSIS — E039 Hypothyroidism, unspecified: Secondary | ICD-10-CM

## 2017-11-09 MED ORDER — DAPAGLIFLOZIN PROPANEDIOL 5 MG PO TABS: 5.0000 mg | ORAL_TABLET | Freq: Every day | ORAL | 9 refills | Status: DC

## 2017-11-09 MED ORDER — HYDROCODONE-ACETAMINOPHEN 5-325 MG PO TABS: ORAL_TABLET | ORAL | 0 refills | Status: DC

## 2017-11-09 MED FILL — FARXIGA 5 MG TABLET: 5 | 30 days supply | Qty: 30 | Fill #0

## 2017-11-09 NOTE — Progress Notes (Addendum)
[place] [facility] [address] [place], [place] [address] [phone]  Date:  11/09/2017   Name:  Debbie Hayes   DOB:  1951/04/25   MRN:  [id number]  PCP:  Darreld Mclean, MD    Chief Complaint: Hypertension (4 month follow up) and Diabetes   History of Present Illness:  Debbie Hayes is a 67 y.o. very pleasant female patient who presents with the following:  Periodic follow-up visit today History of DM, OSA, Migraine HA, hypothyroidism Metformin 100 BID, januvia for her DM- under good control.  However, she notes that she used an SGLT2 inhibitor that they had on hand while she was out of the country not long ago, and was losing weight while taking this med.  Wonders if she could change her [place] for something in this class Lab Results  Component Value Date   HGBA1C 6.6 (H) 05/21/2017    Her neurologist is Dr. Brett Fairy, from recent visit ) OSA on dental device. HST by dental provider. She loves her device. Dr Toy Cookey  2) Migraine chronic, 13- 15  a month after ajovy and aimovig. Use. We will use two Emgaiity shots - and she may well need iv Depakote when in acute pain. Today I will give her 2 doses of EMGALITY. I reviewed her headache journal.   She just got back from [place] to visit her daughter [name] and grands We hope that her new HA medication will help her, but so far she continues to have some headaches  She has a colon scheduled for this fall as she is due  Left hip pain- she has noted some left hip pain reminiscent of sciatica for some time, we got a plain film of her hip last October.  She is interested in doing some PT for this joint  She stopped NSAIDs last year - she cannot use them due to her headaches, NSAIDs then to make them worse.  She suffers from various joint pains- neck, shoulders, back- which are ok when she is doing her normal activities at home but will flare up when she is more  active or traveling.   She does use an occasional narcotic pill for neck pain, shoulder, and back pain- she has been relying on pain meds she has gotten in the past after various operations, etc.  However she is running low on these and would like to do a pain contract with me and get mediation as needed especially for her pains.    NCCSR: she last got hydrocodone from [place] last July, 12 pills, then 20 pills last January She does take 5 mg of ambien on a semi- regular basis  Lab Results  Component Value Date   TSH 1.79 04/27/2017   Could use an A1c and TSH today if she likes  Foot exam today Colon cancer screening:   Patient Active Problem List   Diagnosis Date Noted  . OSA (obstructive sleep apnea) 06/26/2017  . Snoring 03/25/2016  . Palpitations 03/25/2016  . Sleep related headaches 03/25/2016  . Trigeminal anesthesia 11/26/2015  . Spondylolisthesis of lumbar region 08/09/2014  . Back pain 02/17/2014  . Macular degeneration 02/17/2014  . Other and unspecified hyperlipidemia 10/14/2013  . Onychomycosis 12/01/2012  . Diabetes mellitus ([place]) 04/12/2012  . Arthritis 04/12/2012  . Hypothyroid 04/12/2012    Past Medical History:  Diagnosis Date  . Allergy   . Anxiety   . Cataract   .  Chronic migraine BOTOX INJECTION EVERY 3 MONTHS  . Chronic pain in right shoulder   . Constipation   . Diabetes mellitus   . Disorder of inner ear CAUSES VERTIGO OCCASIONALLY  . Fibromyalgia   . GERD (gastroesophageal reflux disease)   . Hemorrhoid   . History of bladder infections   . History of thyroid cancer 2009  S/P TOTAL THYROIDECTOMY AND RADIATION  . Hyperlipidemia   . Hypertension CARDIOLOGIST- DR Wynonia Lawman- WILL REQUEST LATE NOTE   DENIES S & S  . Hypothyroidism   . Insomnia   . Left shoulder pain   . Macular degeneration   . Normal nuclear stress test 01-25-2009  . OA (osteoarthritis) JOINTS  . PONV (postoperative nausea and vomiting) SEVERE  . Rotator cuff disorder LEFT  SHOULDER RTC IMPINGMENT  . Sleep apnea in adult 06/26/2017  . Spondylolisthesis of lumbar region   . 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 Wears glasses     Past Surgical History:  Procedure Laterality Date  . BILATERAL CARPAL TUNNEL RELEASE  1994  . BILATERAL ELBOW SURG.  1999  . BILATERAL SALPINGOOPHECTOMY  1993   POST-OP URETER REPAIR 12 DAYS AFTER   . FOOT ARTHRODESIS Left 07/10/2016   Procedure: LEFT 2ND TARSAL METATARSAL ARTHRODESIS  GASTROC RECESSION LEFT LAPIDUS MODIFIED MCBRIDE BUIONECTOMY;  Surgeon: Wylene Simmer, MD;  Location: [facility];  Service: Orthopedics;  Laterality: Left;  Marland Kitchen GASTROC RECESSION EXTREMITY Left 07/10/2016   Procedure: GASTROC RECESSION EXTREMITY;  Surgeon: Wylene Simmer, MD;  Location: [facility];  Service: Orthopedics;  Laterality: Left;  . LEFT THUMB JOINT REPLACEMENT  2005   RIGHT DONE IN 2004  . RIGHT KNEE ARTHROSCOPY  X2  BEFORE 2011  . RIGHT KNEE ARTHROSCOPY/ PARTIAL LATERAL MENISECTOMY/ TRICOMPARTMENT CHONDROPLASTY/ DECOMPRESSION CYST  10-26-2009  . RIGHT KNEE CLOSED MANIPULATION  09-11-2010  . RIGHT SHOULDER ARTHROSCOPY  2010  &  2004  . TIBIA CYST REMOVED AND ORIF LEG FX  1958  . TONSILLECTOMY  1968  . TOTAL KNEE ARTHROPLASTY  07-16-2010   RIGHT  . TOTAL THYROIDECTOMY  2009   CANCER  (POST-OP BLEED)  AND RADIATION TX  . trigger fingers Right 2013   3rd and 4th fingers  . VAGINAL HYSTERECTOMY  1990    Social History   Tobacco Use  . Smoking status: Never Smoker  . Smokeless tobacco: Never Used  Substance Use Topics  . Alcohol use: Yes    Comment: RARE  . Drug use: No    Family History  Problem Relation Age of Onset  . Colon polyps Sister 42  . Alzheimer's disease Mother   . Heart disease Father   . Cirrhosis Father   . Heart attack Father   . Macular degeneration Father   . Colon cancer Neg Hx     Allergies  Allergen Reactions  . Ciprofloxacin Diarrhea  . Statins     Pt has tried multiple  and cannot tolerate  . Sulfa Antibiotics Hives    Medication list has been reviewed and updated.  Current Outpatient Medications on File Prior to Visit  Medication Sig Dispense Refill  . acetaminophen (TYLENOL) 500 MG tablet Take 500 mg by mouth every 6 (six) hours as needed.    Marland Kitchen azelastine (ASTELIN) 0.1 % nasal spray PLACE 2 SPRAYS INTO BOTH NOSTRILS 2 TIMES DAILY AS DIRECTED 90 mL 0  . blood glucose meter kit and supplies KIT Test blood sugar once daily. Dx code: E11.9 1 each 0  .  Blood Glucose Monitoring Suppl (FREESTYLE LITE) DEVI Check blood sugar no more than twice daily 1 each 0  . cetirizine (ZYRTEC) 10 MG tablet Take 10 mg by mouth daily as needed for allergies.     Marland Kitchen eletriptan (RELPAX) 20 MG tablet TAKE 1 TABLET BY MOUTH AS NEEDED FOR MIGRAINE OR HEADACHE. MAY REPEAT IN 2 HOURS IF HEADACHE PERSISTS OR RECURS. MAX 80 MG/24 HRS. 9 tablet 4  . Galcanezumab-gnlm (EMGALITY) 120 MG/ML SOAJ Inject 1 pen into the skin every 30 (thirty) days. 1 pen 11  . glucose blood (FREESTYLE LITE) test strip Check blood sugars no more than twice daily 100 each 12  . KRILL OIL PO Take by mouth.    . Lancets (FREESTYLE) lancets Check blood sugars no more than twice daily 100 each 12  . losartan (COZAAR) 50 MG tablet Take 1 tablet (50 mg total) by mouth daily. 90 tablet 1  . metaxalone (SKELAXIN) 800 MG tablet Take 1 tablet (800 mg total) by mouth 3 (three) times daily. 90 tablet 0  . metFORMIN (GLUCOPHAGE) 1000 MG tablet TAKE 1 TABLET BY MOUTH TWICE DAILY WITH A MEAL 180 tablet 0  . metoprolol succinate (TOPROL-XL) 25 MG 24 hr tablet Take 1 tablet (25 mg total) by mouth every morning. 90 tablet 3  . montelukast (SINGULAIR) 10 MG tablet TAKE 1 TABLET BY MOUTH AT BEDTIME. 90 tablet 3  . Multiple Vitamins-Minerals (ICAPS AREDS 2) CAPS Take by mouth.    Marland Kitchen NASONEX 50 MCG/ACT nasal spray PLACE 2 SPRAYS INTO THE NOSE DAILY. 17 g 9  . nitrofurantoin (MACRODANTIN) 50 MG capsule Take 50 mg by mouth at bedtime.      . NON FORMULARY     . polyethylene glycol (MIRALAX / GLYCOLAX) packet Take 17 g by mouth daily.    . sitaGLIPtin (JANUVIA) 100 MG tablet Take 1 tablet (100 mg total) by mouth daily. 90 tablet 3  . traZODone (DESYREL) 150 MG tablet Take 200 mg by mouth at bedtime.     Marland Kitchen zolpidem (AMBIEN) 5 MG tablet TAKE 1/2 TABLET BY MOUTH AT BEDTIME AS NEEDED FOR SLEEP 30 tablet 1   No current facility-administered medications on file prior to visit.     Review of Systems:  As per HPI- otherwise negative.   Physical Examination: Vitals:   11/09/17 1558  BP: 122/70  Pulse: 85  Resp: 16  SpO2: 98%   Vitals:   11/09/17 1558  Weight: 183 lb (83 kg)  Height: '5\' 8"'$  (1.727 m)   Body mass index is 27.83 kg/m. Ideal Body Weight: Weight in (lb) to have BMI = 25: 164.1  GEN: WDWN, NAD, Non-toxic, A & O x 3, looks well, overweight HEENT: Atraumatic, Normocephalic. Neck supple. No masses, No LAD.  Bilateral TM wnl, oropharynx normal.  PEERL,EOMI.   Ears and Nose: No external deformity. CV: RRR, No M/G/R. No JVD. No thrill. No extra heart sounds. PULM: CTA B, no wheezes, crackles, rhonchi. No retractions. No resp. distress. No accessory muscle use. EXTR: No c/c/e NEURO Normal gait.  PSYCH: Normally interactive. Conversant. Not depressed or anxious appearing.  Calm demeanor.  Normal foot exam today  Assessment and Plan: Acquired hypothyroidism - Plan: TSH, Thyroglobulin Level  Diabetes mellitus without complication ([place]) - Plan: Hemoglobin A1c, dapagliflozin propanediol (FARXIGA) 5 MG TABS tablet  Left hip pain - Plan: Ambulatory referral to Physical Therapy  Chronic pain syndrome - Plan: HYDROcodone-acetaminophen (NORCO/VICODIN) 5-325 MG tablet, Pain Mgmt, Profile 8 w/Conf, U  Check thyroid  labs today and send results to Dr. Chalmers Cater Will check A1c today.  On metformin and januvia.  She is interested in trying farxiga which is fine.  Gave her this rx today, will [place] PT referral She is  interested in using narcotic pain medications when needed for joint pains.  Anticipates that she may need 2 rx a year. Had her do UDS and contract today so we can treat her rx given as well  Meds ordered this encounter  Medications  . HYDROcodone-acetaminophen (NORCO/VICODIN) 5-325 MG tablet    Sig: Take 1 or 1.5 every 12 hours as needed for pain    Dispense:  40 tablet    Refill:  0  . dapagliflozin propanediol (FARXIGA) 5 MG TABS tablet    Sig: Take 5 mg by mouth daily.    Dispense:  30 tablet    Refill:  9    Signed Lamar Blinks, MD  Send thyroid labs to Dr. Chalmers Cater when they come in  Received labs so far 4/16 Results for orders placed or performed in visit on 11/09/17  TSH  Result Value Ref Range   TSH 1.45 0.35 - 4.50 uIU/mL  Hemoglobin A1c  Result Value Ref Range   Hgb A1c MFr Bld 6.7 (H) 4.6 - 6.5 %   Message to pt   Received her rest of her labs 4/17, message to pt: Results for orders placed or performed in visit on 11/09/17  TSH  Result Value Ref Range   TSH 1.45 0.35 - 4.50 uIU/mL  Hemoglobin A1c  Result Value Ref Range   Hgb A1c MFr Bld 6.7 (H) 4.6 - 6.5 %  Thyroglobulin Level  Result Value Ref Range   Thyroglobulin <0.1 (L) ng/mL   Comment    Pain Mgmt, Profile 8 w/Conf, U  Result Value Ref Range   Creatinine 52.8 > or = 20. mg/dL   pH 7.23 4.5 - 9.0   Oxidant NEGATIVE <200 mcg/mL   Amphetamines NEGATIVE <500 ng/mL   medMATCH Amphetamines CONSISTENT    Benzodiazepines NEGATIVE <100 ng/mL   medMATCH Benzodiazepines CONSISTENT    Marijuana Metabolite NEGATIVE <20 ng/mL   medMATCH Marijuana Metab CONSISTENT    Cocaine Metabolite NEGATIVE <150 ng/mL   medMATCH Cocaine Metab CONSISTENT    Opiates NEGATIVE <100 ng/mL   medMATCH Opiates CONSISTENT    Oxycodone NEGATIVE <100 ng/mL   medMATCH Oxycodone CONSISTENT    Buprenorphine, Urine NEGATIVE <5 ng/mL   medMATCH Buprenorphine CONSISTENT    MDMA NEGATIVE <500 ng/mL   [facility] MDMA CONSISTENT     Alcohol Metabolites NEGATIVE <500 ng/mL   medMATCH Alcohol Metab CONSISTENT    6 Acetylmorphine NEGATIVE <10 ng/mL   medMATCH 6 Acetylmorphine CONSISTENT

## 2017-11-09 NOTE — Patient Instructions (Addendum)
Good to see you today!  I will be in touch with your labs asap  We will trade [place] for [place] for your diabetes control  Take care!

## 2017-11-10 ENCOUNTER — Encounter: Payer: Self-pay | Admitting: Family Medicine

## 2017-11-10 LAB — TSH: TSH: 1.45 u[IU]/mL (ref 0.35–4.50)

## 2017-11-10 LAB — HEMOGLOBIN A1C: Hgb A1c MFr Bld: 6.7 % — ABNORMAL HIGH (ref 4.6–6.5)

## 2017-11-11 ENCOUNTER — Encounter: Payer: Self-pay | Admitting: Family Medicine

## 2017-11-11 LAB — PAIN MGMT, PROFILE 8 W/CONF, U
6 ACETYLMORPHINE: NEGATIVE ng/mL (ref <10)
Alcohol Metabolites: NEGATIVE ng/mL (ref <500)
Amphetamines: NEGATIVE ng/mL (ref <500)
BUPRENORPHINE, URINE: NEGATIVE ng/mL (ref <5)
Benzodiazepines: NEGATIVE ng/mL (ref <100)
CREATININE: 52.8 mg/dL
Cocaine Metabolite: NEGATIVE ng/mL (ref <150)
MDMA: NEGATIVE ng/mL (ref <500)
Marijuana Metabolite: NEGATIVE ng/mL (ref <20)
OPIATES: NEGATIVE ng/mL (ref <100)
Oxidant: NEGATIVE ug/mL (ref <200)
Oxycodone: NEGATIVE ng/mL (ref <100)
PH: 7.23 (ref 4.5–9.0)

## 2017-11-11 LAB — THYROGLOBULIN LEVEL

## 2017-11-11 MED FILL — METOPROLOL SUCCINATE ER 25: 25 | 90 days supply | Qty: 90 | Fill #3

## 2017-11-11 MED FILL — ELETRIPTAN HBR 20 MG TABLET: 20 | 30 days supply | Qty: 6 | Fill #1

## 2017-11-13 ENCOUNTER — Telehealth: Payer: Self-pay | Admitting: Neurology

## 2017-11-13 NOTE — Telephone Encounter (Signed)
Pt called she's had a HA everyday this week starting usually in the morning. Pt thought she could come in for depakote infusion today, I adivsed there was not anyone in the infusion suite today. Pt said she would call back next week if the HA's continued.  FYI

## 2017-11-16 ENCOUNTER — Encounter: Payer: Self-pay | Admitting: Family Medicine

## 2017-11-16 ENCOUNTER — Telehealth: Payer: Self-pay | Admitting: Neurology

## 2017-11-16 DIAGNOSIS — G43001 Migraine without aura, not intractable, with status migrainosus: Secondary | ICD-10-CM | POA: Diagnosis not present

## 2017-11-16 NOTE — Telephone Encounter (Signed)
I will ask Dr Jannifer Franklin as he is work in today.

## 2017-11-16 NOTE — Telephone Encounter (Signed)
Okay for Depacon infusion, 500 mg today.

## 2017-11-16 NOTE — Telephone Encounter (Addendum)
error 

## 2017-11-16 NOTE — Telephone Encounter (Signed)
Pt called back today, state she has intermittent migraine for the past 8 days. He has been taking triptan as directed. At last OV it was noted she may need to come in for depakote infusion for acute pain, she is requesting an appt today for this. Please call to advise.

## 2017-11-16 NOTE — Telephone Encounter (Signed)
Dr Jannifer Franklin looked over her chart and gave the orders for Depacon infusion. Order placed given to infusion suite. Called the patient and made her aware that they would be able to get her in at 14 and the patient verbalized understanding of this.

## 2017-11-17 ENCOUNTER — Other Ambulatory Visit: Payer: Self-pay | Admitting: Family Medicine

## 2017-11-17 DIAGNOSIS — E088 Diabetes mellitus due to underlying condition with unspecified complications: Secondary | ICD-10-CM

## 2017-11-17 DIAGNOSIS — C73 Malignant neoplasm of thyroid gland: Secondary | ICD-10-CM | POA: Diagnosis not present

## 2017-11-17 DIAGNOSIS — E890 Postprocedural hypothyroidism: Secondary | ICD-10-CM | POA: Diagnosis not present

## 2017-11-17 MED FILL — HYDROCODON-APAP 5-325: 5-325 | 14 days supply | Qty: 40 | Fill #0

## 2017-11-17 MED FILL — EMGALITY 120 MG/ML SOAJ: 120 | 30 days supply | Qty: 1 | Fill #0

## 2017-11-18 MED FILL — LOSARTAN POTASSIUM 50 MG TA: 50 | 90 days supply | Qty: 90 | Fill #0

## 2017-11-18 MED FILL — metFORMIN HCL 1000 MG TABS: 1000 | 90 days supply | Qty: 180 | Fill #0

## 2017-11-20 ENCOUNTER — Other Ambulatory Visit: Payer: Self-pay

## 2017-11-20 ENCOUNTER — Ambulatory Visit (AMBULATORY_SURGERY_CENTER): Payer: Self-pay | Admitting: *Deleted

## 2017-11-20 ENCOUNTER — Encounter: Payer: Self-pay | Admitting: Internal Medicine

## 2017-11-20 VITALS — Ht 68.0 in | Wt 179.0 lb

## 2017-11-20 DIAGNOSIS — Z86010 Personal history of colonic polyps: Secondary | ICD-10-CM

## 2017-11-20 MED ORDER — NA SULFATE-K SULFATE-MG SULF 17.5-3.13-1.6 GM/177ML PO SOLN: 1.0000 | Freq: Once | ORAL | 0 refills | Status: AC

## 2017-11-20 MED FILL — SUPREP BOWEL PREP KIT: 17.5-3.13-1 | 2 days supply | Qty: 354 | Fill #0

## 2017-11-20 NOTE — Progress Notes (Signed)
No egg or soy allergy known to patient  No issues with past sedation with any surgeries  or procedures, no intubation problems  No diet pills per patient No home 02 use per patient  No blood thinners per patient  Pt denies issues with constipation  No A fib or A flutter  EMMI video sent to pt's e mail pt declined   

## 2017-11-24 ENCOUNTER — Encounter: Payer: Self-pay | Admitting: Neurology

## 2017-11-25 ENCOUNTER — Ambulatory Visit: Payer: 59 | Admitting: Physical Therapy

## 2017-11-25 ENCOUNTER — Other Ambulatory Visit: Payer: Self-pay | Admitting: Family Medicine

## 2017-11-25 MED FILL — MONTELUKAST SOD 10 MG TAB: 10 | 90 days supply | Qty: 90 | Fill #3

## 2017-11-25 MED FILL — NITROFURANTOIN MCR 50 MG CA: 50 | 30 days supply | Qty: 30 | Fill #0

## 2017-11-26 ENCOUNTER — Encounter: Payer: Self-pay | Admitting: Family Medicine

## 2017-11-26 ENCOUNTER — Encounter: Payer: 59 | Admitting: Family Medicine

## 2017-11-26 ENCOUNTER — Telehealth: Payer: Self-pay | Admitting: Neurology

## 2017-11-26 ENCOUNTER — Other Ambulatory Visit: Payer: Self-pay | Admitting: Neurology

## 2017-11-26 MED ORDER — METHYLPREDNISOLONE 4 MG PO TBPK: ORAL_TABLET | ORAL | 0 refills | Status: DC

## 2017-11-26 MED FILL — METHYLPREDNISOLONE 4 MG TAB: 4 | 6 days supply | Qty: 21 | Fill #0

## 2017-11-26 NOTE — Telephone Encounter (Signed)
Called the pt, her husband was in office and states that pt still struggling with migraines regularly. She had a infusion just last week and stills continues to have headaches. I have informed her that Dr Brett Fairy has recommended she come in for another infusion with steroids and also start her on a steroid dose pak. Pt verbalized understanding. She is out of town and she would like to come in next tues at 11 am pt will call and cancel on Monday if the migraine is not there and knows to call if it starts back up and we need to work her in. I informed Otila Kluver in infusion of this

## 2017-11-26 NOTE — Telephone Encounter (Signed)
Received refill request for traZODone (DESYREL) 100 MG tablet. Last office visit 11/09/17 no date listed for last refill.

## 2017-11-27 MED ORDER — TRAZODONE HCL 100 MG PO TABS: 200.0000 mg | ORAL_TABLET | Freq: Every day | ORAL | 3 refills | Status: DC

## 2017-11-27 MED FILL — traZODone HCL 100 MG TABS: 100 | 90 days supply | Qty: 180 | Fill #0

## 2017-12-04 ENCOUNTER — Other Ambulatory Visit: Payer: Self-pay

## 2017-12-04 ENCOUNTER — Ambulatory Visit (AMBULATORY_SURGERY_CENTER): Payer: 59 | Admitting: Internal Medicine

## 2017-12-04 ENCOUNTER — Encounter: Payer: Self-pay | Admitting: Internal Medicine

## 2017-12-04 VITALS — BP 118/64 | HR 72 | Temp 97.7°F | Resp 14 | Ht 68.0 in | Wt 179.0 lb

## 2017-12-04 DIAGNOSIS — Z8673 Personal history of transient ischemic attack (TIA), and cerebral infarction without residual deficits: Secondary | ICD-10-CM | POA: Diagnosis not present

## 2017-12-04 DIAGNOSIS — Z86010 Personal history of colonic polyps: Secondary | ICD-10-CM | POA: Diagnosis present

## 2017-12-04 DIAGNOSIS — E119 Type 2 diabetes mellitus without complications: Secondary | ICD-10-CM | POA: Diagnosis not present

## 2017-12-04 DIAGNOSIS — I10 Essential (primary) hypertension: Secondary | ICD-10-CM | POA: Diagnosis not present

## 2017-12-04 DIAGNOSIS — G4733 Obstructive sleep apnea (adult) (pediatric): Secondary | ICD-10-CM | POA: Diagnosis not present

## 2017-12-04 DIAGNOSIS — M797 Fibromyalgia: Secondary | ICD-10-CM | POA: Diagnosis not present

## 2017-12-04 DIAGNOSIS — D122 Benign neoplasm of ascending colon: Secondary | ICD-10-CM

## 2017-12-04 MED ORDER — SODIUM CHLORIDE 0.9 % IV SOLN: 500.0000 mL | Freq: Once | INTRAVENOUS | Status: DC

## 2017-12-04 NOTE — Patient Instructions (Signed)
Thank you for allowing [place] to care for you today!  Await pathology results.  Handouts given for Polyps, Diverticulosis, and Hemorrhoids.     YOU HAD AN ENDOSCOPIC PROCEDURE TODAY AT [facility]:   Refer to the procedure report that was given to you for any specific questions about what was found during the examination.  If the procedure report does not answer your questions, please call your gastroenterologist to clarify.  If you requested that your care partner not be given the details of your procedure findings, then the procedure report has been included in a sealed envelope for you to review at your convenience later.  YOU SHOULD EXPECT: Some feelings of bloating in the abdomen. Passage of more gas than usual.  Walking can help get rid of the air that was put into your GI tract during the procedure and reduce the bloating. If you had a lower endoscopy (such as a colonoscopy or flexible sigmoidoscopy) you may notice spotting of blood in your stool or on the toilet paper. If you underwent a bowel prep for your procedure, you may not have a normal bowel movement for a few days.  Please Note:  You might notice some irritation and congestion in your nose or some drainage.  This is from the oxygen used during your procedure.  There is no need for concern and it should clear up in a day or so.  SYMPTOMS TO REPORT IMMEDIATELY:   Following lower endoscopy (colonoscopy or flexible sigmoidoscopy):  Excessive amounts of blood in the stool  Significant tenderness or worsening of abdominal pains  Swelling of the abdomen that is new, acute  Fever of 100F or higher    For urgent or emergent issues, a gastroenterologist can be reached at any hour by calling [phone].   DIET:  We do recommend a small meal at first, but then you may proceed to your regular diet.  Drink plenty of fluids but you should avoid alcoholic beverages for 24 hours.  ACTIVITY:  You should plan to take it  easy for the rest of today and you should NOT DRIVE or use heavy machinery until tomorrow (because of the sedation medicines used during the test).    FOLLOW UP: Our staff will call the number listed on your records the next business day following your procedure to check on you and address any questions or concerns that you may have regarding the information given to you following your procedure. If we do not reach you, we will leave a message.  However, if you are feeling well and you are not experiencing any problems, there is no need to return our call.  We will assume that you have returned to your regular daily activities without incident.  If any biopsies were taken you will be contacted by phone or by letter within the next 1-3 weeks.  Please call us at [phone] if you have not heard about the biopsies in 3 weeks.    SIGNATURES/CONFIDENTIALITY: You and/or your care partner have signed paperwork which will be entered into your electronic medical record.  These signatures attest to the fact that that the information above on your After Visit Summary has been reviewed and is understood.  Full responsibility of the confidentiality of this discharge information lies with you and/or your care-partner.

## 2017-12-04 NOTE — Progress Notes (Signed)
Report to PACU, RN, vss, BBS= Clear.  

## 2017-12-04 NOTE — Op Note (Signed)
[name] Procedure Date: 12/04/2017 8:07 AM MRN: [id number] Endoscopist: Jerene Bears , MD Age: 67 Referring MD:  Date of Birth: 1951-05-28 Gender: Female Account #: [id number] Procedure:                Colonoscopy Indications:              Surveillance: Personal history of adenomatous                            polyps on last colonoscopy > 3 years ago, Last                            colonoscopy: October 2015 Medicines:                Monitored Anesthesia Care Procedure:                Pre-Anesthesia Assessment:                           - Prior to the procedure, a History and Physical                            was performed, and patient medications and                            allergies were reviewed. The patient's tolerance of                            previous anesthesia was also reviewed. The risks                            and benefits of the procedure and the sedation                            options and risks were discussed with the patient.                            All questions were answered, and informed consent                            was obtained. Prior Anticoagulants: The patient has                            taken no previous anticoagulant or antiplatelet                            agents. ASA Grade Assessment: III - A patient with                            severe systemic disease. After reviewing the risks                            and benefits, the patient was deemed in  satisfactory condition to undergo the procedure.                           After obtaining informed consent, the colonoscope                            was passed under direct vision. Throughout the                            procedure, the patient's blood pressure, pulse, and                            oxygen saturations were monitored continuously. The                            Colonoscope was introduced through  the anus and                            advanced to the the cecum, identified by                            appendiceal orifice and ileocecal valve. The                            colonoscopy was performed without difficulty. The                            patient tolerated the procedure well. The quality                            of the bowel preparation was good. The ileocecal                            valve, appendiceal orifice, and rectum were                            photographed. Scope In: 8:13:17 AM Scope Out: 8:29:11 AM Scope Withdrawal Time: 0 hours 10 minutes 44 seconds  Total Procedure Duration: 0 hours 15 minutes 54 seconds  Findings:                 The digital rectal exam was normal.                           A 4 mm polyp was found in the ascending colon. The                            polyp was sessile. The polyp was removed with a                            cold snare. Resection and retrieval were complete.                           Multiple small and large-mouthed diverticula were  found in the sigmoid colon and descending colon.                           Internal hemorrhoids were found during                            retroflexion. The hemorrhoids were small. Complications:            No immediate complications. Estimated Blood Loss:     Estimated blood loss was minimal. Impression:               - One 4 mm polyp in the ascending colon, removed                            with a cold snare. Resected and retrieved.                           - Moderate diverticulosis in the sigmoid colon and                            in the descending colon.                           - Internal hemorrhoids. Recommendation:           - Patient has a contact number available for                            emergencies. The signs and symptoms of potential                            delayed complications were discussed with the                             patient. Return to normal activities tomorrow.                            Written discharge instructions were provided to the                            patient.                           - Resume previous diet.                           - Continue present medications.                           - Await pathology results.                           - Repeat colonoscopy in 5 years for surveillance. Jerene Bears, MD 12/04/2017 8:33:02 AM This report has been signed electronically.

## 2017-12-04 NOTE — Progress Notes (Signed)
Called to room to assist during endoscopic procedure.  Patient ID and intended procedure confirmed with present staff. Received instructions for my participation in the procedure from the performing physician.  

## 2017-12-04 NOTE — Progress Notes (Signed)
Pt's states no medical or surgical changes since previsit or office visit. 

## 2017-12-07 ENCOUNTER — Telehealth: Payer: Self-pay

## 2017-12-07 ENCOUNTER — Telehealth: Payer: Self-pay | Admitting: *Deleted

## 2017-12-07 NOTE — Telephone Encounter (Signed)
No answer for post procedure call back. Will attempt to call back later this afternoon. SM 

## 2017-12-07 NOTE — Telephone Encounter (Signed)
  Follow up Call-  Call back number 12/04/2017  Post procedure Call Back phone  # [phone]  Permission to leave phone message Yes  Some recent data might be hidden     Patient questions:  Do you have a fever, pain , or abdominal swelling? No. Pain Score  0 *  Have you tolerated food without any problems? Yes.    Have you been able to return to your normal activities? Yes.    Do you have any questions about your discharge instructions: Diet   No. Medications  No. Follow up visit  No.  Do you have questions or concerns about your Care? No.  Actions: * If pain score is 4 or above: No action needed, pain <4.

## 2017-12-08 ENCOUNTER — Other Ambulatory Visit: Payer: Self-pay | Admitting: Neurology

## 2017-12-08 ENCOUNTER — Encounter: Payer: Self-pay | Admitting: Neurology

## 2017-12-08 DIAGNOSIS — G43701 Chronic migraine without aura, not intractable, with status migrainosus: Secondary | ICD-10-CM

## 2017-12-08 DIAGNOSIS — G43019 Migraine without aura, intractable, without status migrainosus: Secondary | ICD-10-CM | POA: Diagnosis not present

## 2017-12-08 DIAGNOSIS — G43711 Chronic migraine without aura, intractable, with status migrainosus: Secondary | ICD-10-CM

## 2017-12-08 MED FILL — ZOLPIDEM TARTRATE 5 MG TAB: 5 | 60 days supply | Qty: 30 | Fill #1

## 2017-12-08 MED FILL — FARXIGA 5 MG TABLET: 5 | 30 days supply | Qty: 30 | Fill #1

## 2017-12-08 MED FILL — ELETRIPTAN HBR 20 MG TABLET: 20 | 30 days supply | Qty: 6 | Fill #2

## 2017-12-10 ENCOUNTER — Encounter: Payer: Self-pay | Admitting: Internal Medicine

## 2017-12-10 NOTE — Telephone Encounter (Signed)
Dear Nurse Linna Darner,   I am worried about the at foot dragging and would like to see you in a RV-  I let casey handle the new appoitent and we will order Brain MRI, may be NCV and EMG , depending on reflexes and muscle tone.   I share you frustration with the emgality, ajovy and aimovig responses.  The blood tests I suggested would be altered by steroids , so they can be taken after you completed he dose pack. You may decide to shorten the steroid course as its not helping much.

## 2017-12-13 ENCOUNTER — Telehealth: Payer: 59 | Admitting: Physician Assistant

## 2017-12-13 DIAGNOSIS — R300 Dysuria: Secondary | ICD-10-CM

## 2017-12-13 MED ORDER — CEFDINIR 300 MG PO CAPS: 300.0000 mg | ORAL_CAPSULE | Freq: Two times a day (BID) | ORAL | 0 refills | Status: DC

## 2017-12-13 MED ORDER — CEPHALEXIN 500 MG PO CAPS: 500.0000 mg | ORAL_CAPSULE | Freq: Two times a day (BID) | ORAL | 0 refills | Status: DC

## 2017-12-13 NOTE — Progress Notes (Signed)

## 2017-12-13 NOTE — Addendum Note (Signed)
Addended by: Brunetta Jeans on: 12/13/2017 04:31 PM   Modules accepted: Orders

## 2017-12-15 ENCOUNTER — Ambulatory Visit: Payer: 59 | Admitting: Neurology

## 2017-12-15 ENCOUNTER — Encounter: Payer: Self-pay | Admitting: Neurology

## 2017-12-15 VITALS — BP 113/69 | HR 77 | Ht 68.0 in | Wt 180.0 lb

## 2017-12-15 DIAGNOSIS — M2550 Pain in unspecified joint: Secondary | ICD-10-CM | POA: Diagnosis not present

## 2017-12-15 DIAGNOSIS — G43711 Chronic migraine without aura, intractable, with status migrainosus: Secondary | ICD-10-CM

## 2017-12-15 DIAGNOSIS — E118 Type 2 diabetes mellitus with unspecified complications: Secondary | ICD-10-CM | POA: Insufficient documentation

## 2017-12-15 DIAGNOSIS — G43109 Migraine with aura, not intractable, without status migrainosus: Secondary | ICD-10-CM | POA: Insufficient documentation

## 2017-12-15 DIAGNOSIS — G43709 Chronic migraine without aura, not intractable, without status migrainosus: Secondary | ICD-10-CM | POA: Insufficient documentation

## 2017-12-15 DIAGNOSIS — G43001 Migraine without aura, not intractable, with status migrainosus: Secondary | ICD-10-CM

## 2017-12-15 DIAGNOSIS — E088 Diabetes mellitus due to underlying condition with unspecified complications: Secondary | ICD-10-CM | POA: Diagnosis not present

## 2017-12-15 DIAGNOSIS — E139 Other specified diabetes mellitus without complications: Secondary | ICD-10-CM | POA: Insufficient documentation

## 2017-12-15 DIAGNOSIS — G43019 Migraine without aura, intractable, without status migrainosus: Secondary | ICD-10-CM | POA: Insufficient documentation

## 2017-12-15 MED ORDER — RIZATRIPTAN BENZOATE 10 MG PO TABS: 10.0000 mg | ORAL_TABLET | ORAL | 5 refills | Status: DC | PRN

## 2017-12-15 MED FILL — RIZATRIPTAN BENZOATE 10 MG: 10 | 30 days supply | Qty: 10 | Fill #0

## 2017-12-15 NOTE — Addendum Note (Signed)
Addended by: Larey Seat on: 12/15/2017 04:32 PM   Modules accepted: Orders

## 2017-12-15 NOTE — Patient Instructions (Signed)
Migraine Headache A migraine headache is an intense, throbbing pain on one side or both sides of the head. Migraines may also cause other symptoms, such as nausea, vomiting, and sensitivity to light and noise. What are the causes? Doing or taking certain things may also trigger migraines, such as:  Alcohol.  Smoking.  Medicines, such as: ? Medicine used to treat chest pain (nitroglycerine). ? Birth control pills. ? Estrogen pills. ? Certain blood pressure medicines.  Aged cheeses, chocolate, or caffeine.  Foods or drinks that contain nitrates, glutamate, aspartame, or tyramine.  Physical activity.  Other things that may trigger a migraine include:  Menstruation.  Pregnancy.  Hunger.  Stress, lack of sleep, too much sleep, or fatigue.  Weather changes.  What increases the risk? The following factors may make you more likely to experience migraine headaches:  Age. Risk increases with age.  Family history of migraine headaches.  Being Caucasian.  Depression and anxiety.  Obesity.  Being a woman.  Having a hole in the heart (patent foramen ovale) or other heart problems.  What are the signs or symptoms? The main symptom of this condition is pulsating or throbbing pain. Pain may:  Happen in any area of the head, such as on one side or both sides.  Interfere with daily activities.  Get worse with physical activity.  Get worse with exposure to bright lights or loud noises.  Other symptoms may include:  Nausea.  Vomiting.  Dizziness.  General sensitivity to bright lights, loud noises, or smells.  Before you get a migraine, you may get warning signs that a migraine is developing (aura). An aura may include:  Seeing flashing lights or having blind spots.  Seeing bright spots, halos, or zigzag lines.  Having tunnel vision or blurred vision.  Having numbness or a tingling feeling.  Having trouble talking.  Having muscle weakness.  How is this  diagnosed? A migraine headache can be diagnosed based on:  Your symptoms.  A physical exam.  Tests, such as CT scan or MRI of the head. These imaging tests can help rule out other causes of headaches.  Taking fluid from the spine (lumbar puncture) and analyzing it (cerebrospinal fluid analysis, or CSF analysis).  How is this treated? A migraine headache is usually treated with medicines that:  Relieve pain.  Relieve nausea.  Prevent migraines from coming back.  Treatment may also include:  Acupuncture.  Lifestyle changes like avoiding foods that trigger migraines.  Follow these instructions at home: Medicines  Take over-the-counter and prescription medicines only as told by your health care provider.  Do not drive or use heavy machinery while taking prescription pain medicine.  To prevent or treat constipation while you are taking prescription pain medicine, your health care provider may recommend that you: ? Drink enough fluid to keep your urine clear or pale yellow. ? Take over-the-counter or prescription medicines. ? Eat foods that are high in fiber, such as fresh fruits and vegetables, whole grains, and beans. ? Limit foods that are high in fat and processed sugars, such as fried and sweet foods. Lifestyle  Avoid alcohol use.  Do not use any products that contain nicotine or tobacco, such as cigarettes and e-cigarettes. If you need help quitting, ask your health care provider.  Get at least 8 hours of sleep every night.  Limit your stress. General instructions   Keep a journal to find out what may trigger your migraine headaches. For example, write down: ? What you eat and   drink. ? How much sleep you get. ? Any change to your diet or medicines.  If you have a migraine: ? Avoid things that make your symptoms worse, such as bright lights. ? It may help to lie down in a dark, quiet room. ? Do not drive or use heavy machinery. ? Ask your health care provider  what activities are safe for you while you are experiencing symptoms.  Keep all follow-up visits as told by your health care provider. This is important. Contact a health care provider if:  You develop symptoms that are different or more severe than your usual migraine symptoms. Get help right away if:  Your migraine becomes severe.  You have a fever.  You have a stiff neck.  You have vision loss.  Your muscles feel weak or like you cannot control them.  You start to lose your balance often.  You develop trouble walking.  You faint. This information is not intended to replace advice given to you by your health care provider. Make sure you discuss any questions you have with your health care provider. Document Released: 07/14/2005 Document Revised: 02/01/2016 Document Reviewed: 12/31/2015 Elsevier Interactive Patient Education  2017 Elsevier Inc.   

## 2017-12-15 NOTE — Progress Notes (Addendum)
Provider:  Larey Seat, M D  Referring Provider: Darreld Mclean, MD Primary Care Physician:  Darreld Mclean, MD  Chief Complaint  Patient presents with  . Follow-up    pt alone, rm 10. pt states she is here today to see if there is another cause for the headaches since she is not getting any relief.      HPI:  Debbie Hayes is a 67 y.o. female , seen here as a referral from Dr. Lorelei Pont , now for migraine headache evaluation.   Interval history,  12-15-2017 -the pleasure of seeing Debbie Hayes today, and established patient with chronic migraine, but only temporarily got relief from Depakote infusion and from steroid infusions.  At this time I do not think I have anything else to offer, she has tried [employer], Ajovy  and initially initially was treated with Aimovig.At this time only Botox seems to be an option and I like for her to see one of my colleagues. She has no lateralization with migraine, no hemiplegia.  I will ask for a Botox pre-evaluation with Dr. Jaynee Eagles or Dr. Rexene Alberts.  Debbie Hayes was 2 years ago evaluated in the local ED after she had some facial dysesthesias, an MRI of the brain was obtained and returned normal.  I assume that it was related to her previous shingles infection which had exactly the same distribution.   She did report that during one of her  journeys to [place]  6 years ago she woke up one morning and was confused, had taken Cipro po of diarrhea. It was felt that dehydration and cipro may have caused this fatigue, slurred speech and disturbed comprehension. She has had a steroid taper, concluded on Friday and we will now investigate inflammatory markers.    10-21-2016, I have the pleasure of seeing [name] today in the presence of her husband Dr. Ruben Reason , she has not had success with aimovig  nor ajovy . Aimovig was given on 26 December here in our office, a [name] was started January 10 and yet had over the following  30 days not reduced her headaches.   She describes that she uses twice a week a triptan . She is a diabetic.  She is reluctant to use steroids, but wold agree to a depakote infusion.  She did not tolerate Effexor nor Topiramate well. We will use two Emgaiity shots - and she may well need iv Depakote when in acute pain. Today I will give her 2 doses of EMGALITY. I reviewed her headache journal. .     Interval history from 22 July 2017.  I have the pleasure of meeting with Debbie Hayes today was first receiving an injection with my moving in August 2018, our expectation was a continuous decline in headache intensity and frequency with a peak after 3 months.  She enjoyed a 10-day headache free.  Last month but then again developed headaches to a frequency 13 a month on average.  While she had up to 17 or 18 headaches before this treatment was initiated this is a rather marginal response however in the months of September she had only 9 severe migraines, and I do think that there is a gradual response still possible.  I would like for her to have another injection today and we could double the dose or switch to another preparation was in the same substance group.  My goal is still for the patient be headache  free at a 12 monthsmark.   August 2018, CD  Debbie Hayes has been successfully reducing her AHI with the dental treatment guided by Dr. Toy Cookey, she is currently at an AHI of 13 this is the  5th adjustment for her straps- 7 today she got it she has wanted every night and she has not developed TMJ pain or clicks. In terms of apnea control I think this works well for her I hope that she will reach an AHI of 5 or below but there is already a significant reduction in comparison to her baseline. She would like to have a new evaluation for migraines today. Headaches occur 17-20 times per month over the last 12 months. Headaches last multiple hours, some will improve without medication most will not, and on  occasion there will be a 24-hour headache resulting. [facility] Doctor Domingo Cocking, but her last Botox treatments did not work and shut so she stopped going about 4 years ago., She still has Imitrex for up to twice a week.  And she is allowed to take a Tylenol or nonsteroidal for 3 days a week. She is on Toprol, and still has headaches, failed Topiramate in the past,still needed zofran.  She has severe photophobia.  She has not been on Depakote, Zonegran. CBG treatment to be initiated.      Chief complaint according to patient : The patient has experienced for the second time in 4 years left facial dysesthesias this time on March 27, a feeling as if a liquid was dropping down the face from the left temple. This dysesthesia lasted for about 12 hours and returned every other day. She did not have motor weakness with it the patient has a history of steroid-induced diabetes, history of thyroid cancer, surgical hypothyroidism, gastroesophageal reflux disease, chronic migraine, fibromyalgia hyperlipidemia and TMJ syndrome. To treat TMJ, she wears a mouthguard at night she also has a history of bruxism which is prevented by the dental device. The emergency room medical records did indicate that the patient underwent an MRI of the brain which was entirely normal, that she had an EKG which was normal, and multiple laboratory tests. Medical history : 4 years ago the patient had dysesthesias at the left upper lip, returning for several months. About 19 years ago she had left-sided facial zoster. 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 She expects a new (fourth) grandchild to be born early 2018.  One daughter has Downs syndrome.  She has never used any kind of tobacco product, she rarely drinks wine, perhaps 1 glass every other month. She gave up caffeine 4 years ago when her migraines became chronic. She received Botox  injections for the treatment of migraine.  Mrs. Hoppe reports that she has had a recurrence of migraines which used to be treated with Botox injections. She is concerned that there may be a correlation to her sleep pattern. Usually she sleeps between midnight and 8 AM 8 hours each night. She was diagnosed with fibromyalgia in the early 1990s, and her sleep pattern changed with the fibromyalgia pain. Sleep was less restoring and very fragmented. Since retirement she takes usually at 20 minutes after lunch nap which allows her to stay alert and awake until midnight. The late hours of the day otherwise she can share with her husband. Her husband works now with [employer].   She reports that once she is in bed at midnight  and she will instantly fall asleep. She will stay asleep throughout the night except for one bathroom break. She will use one pillow only, usually sleeps in supine position. The bedroom is cool, quiet and dark she shares a bed with her husband. For background noise the family will be running.no TV . Her husband uses CPAP. He noted her to snore, but he hasn't noted apnea, only some PLMs.   Last visit note CD:  Debbie Hayes, a pediatric nurse, was able to give me a very detailed description of the sensory abnormalities that led to her last emergency room visit. 4 years ago she had dysesthesias of the left face centered at the left upper lip now at a Zygomatic arch. She also took a couple of pictures showing that her left upper lip seemed to be slimmer than her right but there is no obliteration of the nasolabial fold. Based on this I do not think that this is a facial nerve involvement but the middle branch of the trigeminal nerve. 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 I like for her to continue her baby aspirin regimen, as she is  diabetic I would not like to treat her with steroids, there is no report of taking acyclovir years after visible skin lesions have disappeared. I think it is likely that she will have intermittently every couple of years or so one of the spells. I would like for her to go to the emergency room only if she has a focal neurologic weakness change in her speech, swallowing or taste.    Review of Systems:   Out of a complete 14 system review, the patient complains of only the following symptoms, and all other reviewed systems are negative.recurrent dyseasthesias of the left face- only in the middle branch. Which was affected by zoster. Snoring.  Migraine 13 a month. 07-22-2017, last HST from dentist AHI 12.5  Social History   Socioeconomic History  . Marital status: Married    Spouse name: Not on file  . Number of children: Not on file  . Years of education: Not on file  . Highest education level: Not on file  Occupational History  . Not on file  Social Needs  . Financial resource strain: Not on file  . Food insecurity:    Worry: Not on file    Inability: Not on file  . Transportation needs:    Medical: Not on file    Non-medical: Not on file  Tobacco Use  . Smoking status: Never Smoker  . Smokeless tobacco: Never Used  Substance and Sexual Activity  . Alcohol use: Yes    Comment: RARE  . Drug use: No  . Sexual activity: Not on file  Lifestyle  . Physical activity:    Days per week: Not on file    Minutes per session: Not on file  . Stress: Not on file  Relationships  . Social connections:    Talks on phone: Not on file    Gets together: Not on file    Attends religious service: Not on file    Active member of club or organization: Not on file    Attends meetings of clubs or organizations: Not on file    Relationship status: Not on file  . Intimate partner violence:    Fear of current or ex partner: Not  on file    Emotionally abused: Not on file    Physically abused: Not  on file    Forced sexual activity: Not on file  Other Topics Concern  . Not on file  Social History Narrative  . Not on file    Family History  Problem Relation Age of Onset  . Colon polyps Sister 31  . Alzheimer's disease Mother   . Heart disease Father   . Cirrhosis Father   . Heart attack Father   . Macular degeneration Father   . Colon cancer Neg Hx     Past Medical History:  Diagnosis Date  . Allergy   . Cancer [facility])    thyroid cancer  . Cataract   . Chronic migraine BOTOX INJECTION EVERY 3 MONTHS  . Chronic pain in right shoulder   . Constipation   . Diabetes mellitus   . Disorder of inner ear CAUSES VERTIGO OCCASIONALLY  . Fibromyalgia   . GERD (gastroesophageal reflux disease)   . Hemorrhoid   . History of bladder infections   . History of thyroid cancer 2009  S/P TOTAL THYROIDECTOMY AND RADIATION  . Hyperlipidemia   . Hypertension CARDIOLOGIST- DR Wynonia Lawman- WILL REQUEST LATE NOTE   DENIES S & S  . Hypothyroidism   . Insomnia   . Left shoulder pain   . Macular degeneration   . Normal nuclear stress test 01-25-2009  . OA (osteoarthritis) JOINTS  . Osteopenia   . PONV (postoperative nausea and vomiting) SEVERE  . Rotator cuff disorder LEFT SHOULDER RTC IMPINGMENT  . Sleep apnea    mouth guard, no cpap   . Sleep apnea in adult 06/26/2017  . Spondylolisthesis of lumbar region   . 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 Wears glasses     Past Surgical History:  Procedure Laterality Date  . BACK SURGERY     Dr Arnoldo Morale- spondylosis  . BILATERAL CARPAL TUNNEL RELEASE  1994  . BILATERAL ELBOW SURG.  1999  . BILATERAL SALPINGOOPHECTOMY  1993   POST-OP URETER REPAIR 12 DAYS AFTER   . COLONOSCOPY    . FOOT ARTHRODESIS Left 07/10/2016   Procedure: LEFT 2ND TARSAL METATARSAL ARTHRODESIS  GASTROC RECESSION LEFT LAPIDUS MODIFIED MCBRIDE BUIONECTOMY;  Surgeon: Wylene Simmer, MD;  Location: [facility];  Service: Orthopedics;  Laterality: Left;  Marland Kitchen  GASTROC RECESSION EXTREMITY Left 07/10/2016   Procedure: GASTROC RECESSION EXTREMITY;  Surgeon: Wylene Simmer, MD;  Location: [facility];  Service: Orthopedics;  Laterality: Left;  . LEFT THUMB JOINT REPLACEMENT  2005   RIGHT DONE IN 2004  . OOPHORECTOMY    . POLYPECTOMY    . RIGHT KNEE ARTHROSCOPY  X2  BEFORE 2011  . RIGHT KNEE ARTHROSCOPY/ PARTIAL LATERAL MENISECTOMY/ TRICOMPARTMENT CHONDROPLASTY/ DECOMPRESSION CYST  10-26-2009  . RIGHT KNEE CLOSED MANIPULATION  09-11-2010  . RIGHT SHOULDER ARTHROSCOPY  2010  &  2004  . TIBIA CYST REMOVED AND ORIF LEG FX  1958  . TONSILLECTOMY  1968  . TOTAL KNEE ARTHROPLASTY  07-16-2010   RIGHT  . TOTAL THYROIDECTOMY  2009   CANCER  (POST-OP BLEED)  AND RADIATION [place]  . trigger fingers Right 2013   3rd and 4th fingers  . UPPER GASTROINTESTINAL ENDOSCOPY    . VAGINAL HYSTERECTOMY  1990    Current Outpatient Medications  Medication Sig Dispense Refill  . acetaminophen (TYLENOL) 500 MG tablet Take 500 mg by mouth every 6 (six) hours as needed.    Marland Kitchen azelastine (  ASTELIN) 0.1 % nasal spray PLACE 2 SPRAYS INTO BOTH NOSTRILS 2 TIMES DAILY AS DIRECTED 90 mL 0  . blood glucose meter kit and supplies KIT Test blood sugar once daily. Dx code: E11.9 1 each 0  . Blood Glucose Monitoring Suppl (FREESTYLE LITE) DEVI Check blood sugar no more than twice daily 1 each 0  . cefdinir (OMNICEF) 300 MG capsule Take 1 capsule (300 mg total) by mouth 2 (two) times daily. 14 capsule 0  . cephALEXin (KEFLEX) 500 MG capsule   0  . cetirizine (ZYRTEC) 10 MG tablet Take 10 mg by mouth daily as needed for allergies.     . dapagliflozin propanediol (FARXIGA) 5 MG TABS tablet Take 5 mg by mouth daily. 30 tablet 9  . eletriptan (RELPAX) 20 MG tablet TAKE 1 TABLET BY MOUTH AS NEEDED FOR MIGRAINE OR HEADACHE. MAY REPEAT IN 2 HOURS IF HEADACHE PERSISTS OR RECURS. MAX 80 MG/24 HRS. 9 tablet 4  . Galcanezumab-gnlm (EMGALITY) 120 MG/ML SOAJ Inject 1 pen into the skin every  30 (thirty) days. 1 pen 11  . glucose blood (FREESTYLE LITE) test strip Check blood sugars no more than twice daily 100 each 12  . HYDROcodone-acetaminophen (NORCO/VICODIN) 5-325 MG tablet Take 1 or 1.5 every 12 hours as needed for pain 40 tablet 0  . KRILL OIL PO Take by mouth.    . Lancets (FREESTYLE) lancets Check blood sugars no more than twice daily 100 each 12  . losartan (COZAAR) 50 MG tablet TAKE 1 TABLET BY MOUTH DAILY. 90 tablet 3  . metaxalone (SKELAXIN) 800 MG tablet Take 1 tablet (800 mg total) by mouth 3 (three) times daily. 90 tablet 0  . metFORMIN (GLUCOPHAGE) 1000 MG tablet TAKE 1 TABLET BY MOUTH TWICE DAILY WITH A MEAL 180 tablet 0  . methylPREDNISolone (MEDROL DOSEPAK) 4 MG TBPK tablet Please follow dosepak instructions. Please take after receiving IV infusion in office 21 tablet 0  . metoprolol succinate (TOPROL-XL) 25 MG 24 hr tablet Take 1 tablet (25 mg total) by mouth every morning. 90 tablet 3  . montelukast (SINGULAIR) 10 MG tablet TAKE 1 TABLET BY MOUTH AT BEDTIME. 90 tablet 3  . Multiple Vitamins-Minerals (ICAPS AREDS 2) CAPS Take by mouth.    Marland Kitchen NASONEX 50 MCG/ACT nasal spray PLACE 2 SPRAYS INTO THE NOSE DAILY. 17 g 9  . nitrofurantoin (MACRODANTIN) 50 MG capsule Take 50 mg by mouth at bedtime.     . polyethylene glycol (MIRALAX / GLYCOLAX) packet Take 17 g by mouth daily.    . traZODone (DESYREL) 100 MG tablet Take 2 tablets (200 mg total) by mouth at bedtime. 180 tablet 3  . zolpidem (AMBIEN) 5 MG tablet TAKE 1/2 TABLET BY MOUTH AT BEDTIME AS NEEDED FOR SLEEP 30 tablet 1   Current Facility-Administered Medications  Medication Dose Route Frequency Provider Last Rate Last Dose  . 0.9 %  sodium chloride infusion  500 mL Intravenous Once Pyrtle, Lajuan Lines, MD        Allergies as of 12/15/2017 - Review Complete 12/15/2017  Allergen Reaction Noted  . Ciprofloxacin Diarrhea 07/10/2016  . Statins  11/09/2017  . Sulfa antibiotics Hives 09/05/2011    Vitals: BP 113/69    Pulse 77   Ht _0  (1.727 m)   Wt 180 lb (81.6 kg)   BMI 27.37 kg/m  Last Weight:  Wt Readings from Last 1 Encounters:  12/15/17 180 lb (81.6 kg)   EHU:DJSH mass index is 27.37 kg/m.  Last Height:   Ht Readings from Last 1 Encounters:  12/15/17 _0  (1.727 m)    Physical exam:  General: The patient is awake, alert and appears not in acute distress. The patient is well groomed. Neck circumference is 14.75 inches, Mallampati grade 2 without lateral restriction. Neurologic exam : The patient is awake and alert, oriented to place and time.   Memory subjective described as intact.  Mood and affect are appropriate.  Cranial nerves:Pupils are equal and briskly reactive to light.  Facial sensation the patient describes a numb spot lateral to the left outer angle of the eye and radiating over the zygomatic arch. Never cross the midline. 4 years ago she had numbness on the left upper lip. motor strength is symmetric and tongue and uvula move midline. Shoulder shrug was symmetrical.  Deep tendon reflexes: in the  upper and lower extremities are symmetric and intact. Babinski maneuver response is downgoing.   1) OSA on dental device. HST by dental provider. She loves her device. Dr Toy Cookey   2) Migraine chronic, 13- 15  a month after ajovy and aimovig. No lasting response to Depakote or steroids. I will ask for a Botox pre-evaluation with Dr. Jaynee Eagles, who accepted the patient .  Can last up to 24 hours, no aura , photophobia, pulsating pain, nearly always left, no hemiplegia.  3) inflammatory panel. Not MS, not ALS, not stroke.    I spent more than15 minutes of face to face time with the patient. Greater than 50% of time was spent in counseling and coordination of care. We have discussed the diagnosis and differential and I answered the patient's questions.     Asencion Partridge Ann-Marie Kluge MD  12/15/2017   CC: Darreld Mclean, Md [place],   [id number]

## 2017-12-17 ENCOUNTER — Encounter: Payer: Self-pay | Admitting: Family Medicine

## 2017-12-17 DIAGNOSIS — Z1231 Encounter for screening mammogram for malignant neoplasm of breast: Secondary | ICD-10-CM | POA: Diagnosis not present

## 2017-12-17 DIAGNOSIS — M8589 Other specified disorders of bone density and structure, multiple sites: Secondary | ICD-10-CM | POA: Diagnosis not present

## 2017-12-18 ENCOUNTER — Telehealth: Payer: 59 | Admitting: Nurse Practitioner

## 2017-12-18 DIAGNOSIS — N3000 Acute cystitis without hematuria: Secondary | ICD-10-CM

## 2017-12-18 LAB — PROTEIN ELECTROPHORESIS, SERUM
A/G RATIO SPE: 1.3 (ref 0.7–1.7)
Albumin ELP: 3.6 g/dL (ref 2.9–4.4)
Alpha 1: 0.2 g/dL (ref 0.0–0.4)
Alpha 2: 0.8 g/dL (ref 0.4–1.0)
Beta: 1.1 g/dL (ref 0.7–1.3)
GAMMA GLOBULIN: 0.6 g/dL (ref 0.4–1.8)
GLOBULIN, TOTAL: 2.8 g/dL (ref 2.2–3.9)
TOTAL PROTEIN: 6.4 g/dL (ref 6.0–8.5)

## 2017-12-18 LAB — SEDIMENTATION RATE: SED RATE: 6 mm/h (ref 0–40)

## 2017-12-18 LAB — ANA W/REFLEX IF POSITIVE: ANA: NEGATIVE

## 2017-12-18 LAB — RHEUMATOID FACTOR: RHEUMATOID FACTOR: 35.5 [IU]/mL — AB (ref 0.0–13.9)

## 2017-12-18 LAB — C-REACTIVE PROTEIN: CRP: 0.9 mg/L (ref 0.0–4.9)

## 2017-12-18 MED ORDER — DOXYCYCLINE HYCLATE 100 MG PO TABS: 100.0000 mg | ORAL_TABLET | Freq: Two times a day (BID) | ORAL | 0 refills | Status: DC

## 2017-12-18 MED FILL — EMGALITY 120 MG/ML SOAJ: 120 | 30 days supply | Qty: 1 | Fill #1

## 2017-12-18 NOTE — Progress Notes (Signed)
We are sorry that you are not feeling well.  Here is how we plan to help!  Based on what you shared with me it looks like you most likely have a simple urinary tract infection.  A UTI (Urinary Tract Infection) is a bacterial infection of the bladder.  Most cases of urinary tract infections are simple to treat but a key part of your care is to encourage you to drink plenty of fluids and watch your symptoms carefully.  I have prescribed doxycycline 100mg  1 po bid for 7 days..  Your symptoms should gradually improve. Call us if the burning in your urine worsens, you develop worsening fever, back pain or pelvic pain or if your symptoms do not resolve after completing the antibiotic.  Urinary tract infections can be prevented by drinking plenty of water to keep your body hydrated.  Also be sure when you wipe, wipe from front to back and don't hold it in!  If possible, empty your bladder every 4 hours.  Your e-visit answers were reviewed by a board certified advanced clinical practitioner to complete your personal care plan.  Depending on the condition, your plan could have included both over the counter or prescription medications.  If there is a problem please reply  once you have received a response from your provider.  Your safety is important to [place].  If you have drug allergies check your prescription carefully.    You can use MyChart to ask questions about today's visit, request a non-urgent call back, or ask for a work or school excuse for 24 hours related to this e-Visit. If it has been greater than 24 hours you will need to follow up with your provider, or enter a new e-Visit to address those concerns.   You will get an e-mail in the next two days asking about your experience.  I hope that your e-visit has been valuable and will speed your recovery. Thank you for using e-visits.

## 2017-12-23 ENCOUNTER — Encounter: Payer: Self-pay | Admitting: Family Medicine

## 2017-12-24 MED ORDER — FLUCONAZOLE 150 MG PO TABS: 150.0000 mg | ORAL_TABLET | Freq: Once | ORAL | 0 refills | Status: AC

## 2017-12-24 MED ORDER — FLUCONAZOLE 150 MG PO TABS: 150.0000 mg | ORAL_TABLET | Freq: Once | ORAL | 0 refills | Status: DC

## 2017-12-28 ENCOUNTER — Telehealth: Payer: Self-pay | Admitting: Neurology

## 2017-12-28 ENCOUNTER — Telehealth: Payer: Self-pay | Admitting: *Deleted

## 2017-12-28 NOTE — Telephone Encounter (Signed)
Received Bone Density results from [facility]; forwarded to provider/SLS 06/03

## 2017-12-28 NOTE — Telephone Encounter (Signed)
Pt called to set up appt to see Dr Jaynee Eagles for botox. Please see referral. Thank you

## 2017-12-29 NOTE — Telephone Encounter (Signed)
Oh no she is fine to just come for botox injections and no consultation as she sees Dr. Brett Fairy who documented her migraines, Dr. Brett Fairy said she started the request process for botox approveal. So Andee Poles should reach out and schedule her if approves.  Andee Poles - is this patient on your botox radar?

## 2017-12-29 NOTE — Telephone Encounter (Signed)
I did not receive a submission for this patient but I will happy to get her scheduled. I will need an enrollment form.  I called to go ahead and put the patient on your schedule but she did not answer so I left a VM asking her to call me back.

## 2017-12-29 NOTE — Progress Notes (Signed)
[facility] [address] [place], [place] [address] [phone]  Date:  12/30/2017   Name:  Debbie Hayes   DOB:  Apr 22, 1951   MRN:  [id number]  PCP:  Darreld Mclean, MD    Chief Complaint: Thumb injury (one week ago, left thumb,jammed it) and Palpitations (2 weeks ago, when drinking sugar heart races, increased bp med, no better)   History of Present Illness:  Debbie Hayes is a 67 y.o. very pleasant female patient who presents with the following:  Last seen here in April: Check thyroid labs today and send results to Dr. Chalmers Cater Will check A1c today.  On metformin and januvia.  She is interested in trying farxiga which is fine.  Gave her this rx today, will dc [place] PT referral She is interested in using narcotic pain medications when needed for joint pains.  Anticipates that she may need 2 rx a year. Had her do UDS and contract today so we can treat her  Lab Results  Component Value Date   HGBA1C 6.7 (H) 11/09/2017   Here today with concern of a left thumb injury- the jammed her thumb while at the beach 10 days ago while moving some furniture. She thought it was ok, but it has continued to hurt.  Yesterday her husband tried to hold her hand and she had a lot of pain at the base of the thumb, so she decided to come in.   Also, a couple of weeks ago she started to get a lot of palpitations. She may get palpitations if she uses caffeine, but otherwise generally does not experience these.  She has not been consuming caffeine so she got concerned.   Palpitations seem to occur in runs and they may last for 30 minutes  Can occur any time, not related to activity or exertion No CP or SOB   She has done at [facility] in the past per Dr. Wynonia Lawman and did well- maybe 5 years ago  She tried increasing her toprol to 50 but this did not resolve her sx  She reports that she has taken 1 vicodin since I gave it to her in  April  Lab Results  Component Value Date   TSH 1.45 11/09/2017     Patient Active Problem List   Diagnosis Date Noted  . Chronic migraine with aura 12/15/2017  . Diabetes mellitus due to underlying condition with complication ([place]) 97/08/6376  . 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 Arthralgia 12/15/2017  . OSA (obstructive sleep apnea) 06/26/2017  . Snoring 03/25/2016  . Palpitations 03/25/2016  . Sleep related headaches 03/25/2016  . Trigeminal anesthesia 11/26/2015  . Spondylolisthesis of lumbar region 08/09/2014  . Back pain 02/17/2014  . Macular degeneration 02/17/2014  . Other and unspecified hyperlipidemia 10/14/2013  . Onychomycosis 12/01/2012  . Diabetes mellitus ([place]) 04/12/2012  . Arthritis 04/12/2012  . Hypothyroid 04/12/2012    Past Medical History:  Diagnosis Date  . Allergy   . Cancer [facility])    thyroid cancer  . Cataract   . Chronic migraine BOTOX INJECTION EVERY 3 MONTHS  . Chronic pain in right shoulder   . Constipation   . Diabetes mellitus   . Disorder of inner ear CAUSES VERTIGO OCCASIONALLY  . Fibromyalgia   . GERD (gastroesophageal reflux disease)   . Hemorrhoid   . History of bladder infections   . History of  thyroid cancer 2009  S/P TOTAL THYROIDECTOMY AND RADIATION  . Hyperlipidemia   . Hypertension CARDIOLOGIST- DR Wynonia Lawman- WILL REQUEST LATE NOTE   DENIES S & S  . Hypothyroidism   . Insomnia   . Left shoulder pain   . Macular degeneration   . Normal nuclear stress test 01-25-2009  . OA (osteoarthritis) JOINTS  . Osteopenia   . PONV (postoperative nausea and vomiting) SEVERE  . Rotator cuff disorder LEFT SHOULDER RTC IMPINGMENT  . Sleep apnea    mouth guard, no cpap   . Sleep apnea in adult 06/26/2017  . Spondylolisthesis of lumbar region   . 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 Wears glasses     Past Surgical History:  Procedure Laterality Date  . BACK SURGERY     Dr Arnoldo Morale-  spondylosis  . BILATERAL CARPAL TUNNEL RELEASE  1994  . BILATERAL ELBOW SURG.  1999  . BILATERAL SALPINGOOPHECTOMY  1993   POST-OP URETER REPAIR 12 DAYS AFTER   . COLONOSCOPY    . FOOT ARTHRODESIS Left 07/10/2016   Procedure: LEFT 2ND TARSAL METATARSAL ARTHRODESIS  GASTROC RECESSION LEFT LAPIDUS MODIFIED MCBRIDE BUIONECTOMY;  Surgeon: Wylene Simmer, MD;  Location: [facility];  Service: Orthopedics;  Laterality: Left;  Marland Kitchen GASTROC RECESSION EXTREMITY Left 07/10/2016   Procedure: GASTROC RECESSION EXTREMITY;  Surgeon: Wylene Simmer, MD;  Location: [facility];  Service: Orthopedics;  Laterality: Left;  . LEFT THUMB JOINT REPLACEMENT  2005   RIGHT DONE IN 2004  . OOPHORECTOMY    . POLYPECTOMY    . RIGHT KNEE ARTHROSCOPY  X2  BEFORE 2011  . RIGHT KNEE ARTHROSCOPY/ PARTIAL LATERAL MENISECTOMY/ TRICOMPARTMENT CHONDROPLASTY/ DECOMPRESSION CYST  10-26-2009  . RIGHT KNEE CLOSED MANIPULATION  09-11-2010  . RIGHT SHOULDER ARTHROSCOPY  2010  &  2004  . TIBIA CYST REMOVED AND ORIF LEG FX  1958  . TONSILLECTOMY  1968  . TOTAL KNEE ARTHROPLASTY  07-16-2010   RIGHT  . TOTAL THYROIDECTOMY  2009   CANCER  (POST-OP BLEED)  AND RADIATION [place]  . trigger fingers Right 2013   3rd and 4th fingers  . UPPER GASTROINTESTINAL ENDOSCOPY    . VAGINAL HYSTERECTOMY  1990    Social History   Tobacco Use  . Smoking status: Never Smoker  . Smokeless tobacco: Never Used  Substance Use Topics  . Alcohol use: Yes    Comment: RARE  . Drug use: No    Family History  Problem Relation Age of Onset  . Colon polyps Sister 63  . Alzheimer's disease Mother   . Heart disease Father   . Cirrhosis Father   . Heart attack Father   . Macular degeneration Father   . Colon cancer Neg Hx     Allergies  Allergen Reactions  . Ciprofloxacin Diarrhea    Neurological effect   . Statins     Pt has tried multiple and cannot tolerate  . Sulfa Antibiotics Hives    Medication list has been  reviewed and updated.  Current Outpatient Medications on File Prior to Visit  Medication Sig Dispense Refill  . acetaminophen (TYLENOL) 500 MG tablet Take 500 mg by mouth every 6 (six) hours as needed.    Marland Kitchen azelastine (ASTELIN) 0.1 % nasal spray PLACE 2 SPRAYS INTO BOTH NOSTRILS 2 TIMES DAILY AS DIRECTED 90 mL 0  . blood glucose meter kit and supplies KIT Test blood sugar once daily. Dx code: E11.9 1 each 0  . Blood Glucose  Monitoring Suppl (FREESTYLE LITE) DEVI Check blood sugar no more than twice daily 1 each 0  . cetirizine (ZYRTEC) 10 MG tablet Take 10 mg by mouth daily as needed for allergies.     . dapagliflozin propanediol (FARXIGA) 5 MG TABS tablet Take 5 mg by mouth daily. 30 tablet 9  . Galcanezumab-gnlm (EMGALITY) 120 MG/ML SOAJ Inject 1 pen into the skin every 30 (thirty) days. 1 pen 11  . glucose blood (FREESTYLE LITE) test strip Check blood sugars no more than twice daily 100 each 12  . HYDROcodone-acetaminophen (NORCO/VICODIN) 5-325 MG tablet Take 1 or 1.5 every 12 hours as needed for pain 40 tablet 0  . KRILL OIL PO Take by mouth.    . Lancets (FREESTYLE) lancets Check blood sugars no more than twice daily 100 each 12  . losartan (COZAAR) 50 MG tablet TAKE 1 TABLET BY MOUTH DAILY. 90 tablet 3  . metaxalone (SKELAXIN) 800 MG tablet Take 1 tablet (800 mg total) by mouth 3 (three) times daily. 90 tablet 0  . metFORMIN (GLUCOPHAGE) 1000 MG tablet TAKE 1 TABLET BY MOUTH TWICE DAILY WITH A MEAL 180 tablet 0  . metoprolol succinate (TOPROL-XL) 25 MG 24 hr tablet Take 1 tablet (25 mg total) by mouth every morning. (Patient taking differently: Take 50 mg by mouth every morning. ) 90 tablet 3  . montelukast (SINGULAIR) 10 MG tablet TAKE 1 TABLET BY MOUTH AT BEDTIME. 90 tablet 3  . Multiple Vitamins-Minerals (ICAPS AREDS 2) CAPS Take by mouth.    Marland Kitchen NASONEX 50 MCG/ACT nasal spray PLACE 2 SPRAYS INTO THE NOSE DAILY. 17 g 9  . nitrofurantoin (MACRODANTIN) 50 MG capsule Take 50 mg by mouth at  bedtime.     . polyethylene glycol (MIRALAX / GLYCOLAX) packet Take 17 g by mouth daily.    . rizatriptan (MAXALT) 10 MG tablet Take 1 tablet (10 mg total) by mouth as needed. May repeat in 2 hours if needed 10 tablet 5  . traZODone (DESYREL) 100 MG tablet Take 2 tablets (200 mg total) by mouth at bedtime. (Patient taking differently: Take 250 mg by mouth at bedtime. ) 180 tablet 3  . zolpidem (AMBIEN) 5 MG tablet TAKE 1/2 TABLET BY MOUTH AT BEDTIME AS NEEDED FOR SLEEP 30 tablet 1   No current facility-administered medications on file prior to visit.     Review of Systems:  As per HPI- otherwise negative. No fever or chills No CP or SOB Wt Readings from Last 3 Encounters:  12/30/17 181 lb (82.1 kg)  12/15/17 180 lb (81.6 kg)  12/04/17 179 lb (81.2 kg)   BP Readings from Last 3 Encounters:  12/30/17 112/68  12/15/17 113/69  12/04/17 118/64      Physical Examination: Vitals:   12/30/17 1510  BP: 112/68  Pulse: 85  Resp: 16  SpO2: 98%   Vitals:   12/30/17 1510  Weight: 181 lb (82.1 kg)  Height: '5\' 8"'$  (1.727 m)   Body mass index is 27.52 kg/m. Ideal Body Weight: Weight in (lb) to have BMI = 25: 164.1  GEN: WDWN, NAD, Non-toxic, A & O x 3, looks well HEENT: Atraumatic, Normocephalic. Neck supple. No masses, No LAD. Ears and Nose: No external deformity. CV: RRR, No M/G/R. No JVD. No thrill. No extra heart sounds. No evidence of abnl rhythm at time of exam PULM: CTA B, no wheezes, crackles, rhonchi. No retractions. No resp. distress. No accessory muscle use. EXTR: No c/c/e NEURO Normal gait.  PSYCH:  Normally interactive. Conversant. Not depressed or anxious appearing.  Calm demeanor.  Left hand: she is tender at the MCP joint, but not at the IP joint and not at the scaphoid bone.  Normal ROM of the thumb and strength is ok   EKG: right BBB, no change from previous- compared with EKG from 2018   Assessment and Plan: Palpitations - Plan: EKG 12-Lead, Holter monitor -  48 hour  Chronic pain of left thumb - Plan: DG Hand Complete Left  Sprain of metacarpophalangeal (MCP) joint of left thumb, initial encounter here today with a sprain of left thumb.  X-rays negative for any fracture as below Placed in a thumb spica splint for comfort.  She will let me know if not resolved in the next couple of weeks  Palpitations- normal EKG at this time.  Will obtain a holter monitor in hopes of identifying the cause of her palpitations Recent TSH ok Lab Results  Component Value Date   TSH 1.45 11/09/2017   Asked her to seek care if any change or worsening of her sx    Signed Lamar Blinks, MD  Dg Hand Complete Left  Result Date: 12/30/2017 CLINICAL DATA:  Chronic left thumb pain. EXAM: LEFT HAND - COMPLETE 3+ VIEW COMPARISON:  Radiographs of June 29, 2010. FINDINGS: There is no evidence of fracture or dislocation. The trapezium is not present as noted on prior exam, most consistent with chronic resorption. Mild degenerative changes seen involving the first interphalangeal joint. Soft tissues are unremarkable. 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 No acute abnormality seen in the left hand. Electronically Signed   By: Marijo Conception, M.D.   On: 12/30/2017 16:20

## 2017-12-29 NOTE — Telephone Encounter (Signed)
If she has not been seen in our office before I need a consult first. You can squeeze her in next week if we can.

## 2017-12-29 NOTE — Telephone Encounter (Signed)
Botox enrollment form initiated. Ready for Dr. Cathren Laine signature.

## 2017-12-30 ENCOUNTER — Encounter: Payer: Self-pay | Admitting: Family Medicine

## 2017-12-30 ENCOUNTER — Ambulatory Visit (HOSPITAL_BASED_OUTPATIENT_CLINIC_OR_DEPARTMENT_OTHER)
Admission: RE | Admit: 2017-12-30 | Discharge: 2017-12-30 | Disposition: A | Discharge status: Home / Self Care | Payer: 59 | Source: Ambulatory Visit | Attending: Family Medicine | Admitting: Family Medicine

## 2017-12-30 ENCOUNTER — Ambulatory Visit: Payer: 59 | Admitting: Family Medicine

## 2017-12-30 ENCOUNTER — Telehealth: Payer: Self-pay | Admitting: Neurology

## 2017-12-30 VITALS — BP 112/68 | HR 85 | Resp 16 | Ht 68.0 in | Wt 181.0 lb

## 2017-12-30 DIAGNOSIS — G8929 Other chronic pain: Secondary | ICD-10-CM | POA: Insufficient documentation

## 2017-12-30 DIAGNOSIS — R002 Palpitations: Secondary | ICD-10-CM | POA: Diagnosis not present

## 2017-12-30 DIAGNOSIS — S63642A Sprain of metacarpophalangeal joint of left thumb, initial encounter: Secondary | ICD-10-CM | POA: Diagnosis not present

## 2017-12-30 DIAGNOSIS — M79645 Pain in left finger(s): Secondary | ICD-10-CM

## 2017-12-30 DIAGNOSIS — M19042 Primary osteoarthritis, left hand: Secondary | ICD-10-CM | POA: Insufficient documentation

## 2017-12-30 NOTE — Patient Instructions (Addendum)
We will set you up for a holter monitor next week- in the meantime if any changes please let me know Wear the splint as needed for comfort, I'll be in touch with your report asap

## 2017-12-30 NOTE — Telephone Encounter (Signed)
Patient requested July 24th at 4:00, is this ok or do I need to call and push her out? Pease advise.

## 2017-12-31 NOTE — Telephone Encounter (Signed)
I called and scheduled the patient for June 26th at 3:00.

## 2018-01-05 MED FILL — LEVOTHYROXINE 125 MCG TABLE: 125 | 60 days supply | Qty: 60 | Fill #2

## 2018-01-05 MED FILL — NITROFURANTOIN MCR 50 MG CA: 50 | 30 days supply | Qty: 30 | Fill #1

## 2018-01-05 NOTE — Telephone Encounter (Signed)
[id number] auth: NPR  [id number] auth: NPR ref # Harlin Rain on 01/05/18 @ 3:46 pm EE

## 2018-01-06 ENCOUNTER — Ambulatory Visit (INDEPENDENT_AMBULATORY_CARE_PROVIDER_SITE_OTHER): Payer: 59 | Admitting: Family Medicine

## 2018-01-06 ENCOUNTER — Encounter: Payer: Self-pay | Admitting: Family Medicine

## 2018-01-06 VITALS — BP 122/68 | HR 80 | Resp 16 | Ht 68.0 in | Wt 181.0 lb

## 2018-01-06 DIAGNOSIS — I10 Essential (primary) hypertension: Secondary | ICD-10-CM

## 2018-01-06 DIAGNOSIS — E119 Type 2 diabetes mellitus without complications: Secondary | ICD-10-CM

## 2018-01-06 DIAGNOSIS — G4700 Insomnia, unspecified: Secondary | ICD-10-CM | POA: Diagnosis not present

## 2018-01-06 DIAGNOSIS — G43711 Chronic migraine without aura, intractable, with status migrainosus: Secondary | ICD-10-CM | POA: Diagnosis not present

## 2018-01-06 DIAGNOSIS — Z0000 Encounter for general adult medical examination without abnormal findings: Secondary | ICD-10-CM | POA: Diagnosis not present

## 2018-01-06 MED ORDER — TRAZODONE HCL 100 MG PO TABS: 250.0000 mg | ORAL_TABLET | Freq: Every day | ORAL | 3 refills | Status: DC

## 2018-01-06 MED ORDER — SUMATRIPTAN SUCCINATE 100 MG PO TABS: 100.0000 mg | ORAL_TABLET | Freq: Once | ORAL | 5 refills | Status: DC

## 2018-01-06 MED ORDER — SITAGLIPTIN PHOSPHATE 100 MG PO TABS
100.0000 mg | ORAL_TABLET | Freq: Every day | ORAL | 3 refills | Status: DC
Start: 2018-01-06 — End: 2019-01-11

## 2018-01-06 MED ORDER — METOPROLOL SUCCINATE ER 50 MG PO TB24: 50.0000 mg | ORAL_TABLET | Freq: Every morning | ORAL | 3 refills | Status: DC

## 2018-01-06 MED FILL — JANUVIA 100 MG TABLET: 100 | 90 days supply | Qty: 90 | Fill #0

## 2018-01-06 MED FILL — SUMATRIPTAN SUCC 100 MG TAB: 100 | 30 days supply | Qty: 9 | Fill #0

## 2018-01-06 MED FILL — METOPROLOL SUCCINATE ER 50: 50 | 90 days supply | Qty: 90 | Fill #0

## 2018-01-06 NOTE — Patient Instructions (Signed)
Good to see you!  Let's plan to do full fasting labs at next visit- 4-6 months   Health Maintenance for Postmenopausal Women Menopause is a normal process in which your reproductive ability comes to an end. This process happens gradually over a span of months to years, usually between the ages of 24 and 49. Menopause is complete when you have missed 12 consecutive menstrual periods. It is important to talk with your health care provider about some of the most common conditions that affect postmenopausal women, such as heart disease, cancer, and bone loss (osteoporosis). Adopting a healthy lifestyle and getting preventive care can help to promote your health and wellness. Those actions can also lower your chances of developing some of these common conditions. What should I know about menopause? During menopause, you may experience a number of symptoms, such as:  Moderate-to-severe hot flashes.  Night sweats.  Decrease in sex drive.  Mood swings.  Headaches.  Tiredness.  Irritability.  Memory problems.  Insomnia.  Choosing to treat or not to treat menopausal changes is an individual decision that you make with your health care provider. What should I know about hormone replacement therapy and supplements? Hormone therapy products are effective for treating symptoms that are associated with menopause, such as hot flashes and night sweats. Hormone replacement carries certain risks, especially as you become older. If you are thinking about using estrogen or estrogen with progestin treatments, discuss the benefits and risks with your health care provider. What should I know about heart disease and stroke? Heart disease, heart attack, and stroke become more likely as you age. This may be due, in part, to the hormonal changes that your body experiences during menopause. These can affect how your body processes dietary fats, triglycerides, and cholesterol. Heart attack and stroke are both  medical emergencies. There are many things that you can do to help prevent heart disease and stroke:  Have your blood pressure checked at least every 1-2 years. High blood pressure causes heart disease and increases the risk of stroke.  If you are 18-74 years old, ask your health care provider if you should take aspirin to prevent a heart attack or a stroke.  Do not use any tobacco products, including cigarettes, chewing tobacco, or electronic cigarettes. If you need help quitting, ask your health care provider.  It is important to eat a healthy diet and maintain a healthy weight. ? Be sure to include plenty of vegetables, fruits, low-fat dairy products, and lean protein. ? Avoid eating foods that are high in solid fats, added sugars, or salt (sodium).  Get regular exercise. This is one of the most important things that you can do for your health. ? Try to exercise for at least 150 minutes each week. The type of exercise that you do should increase your heart rate and make you sweat. This is known as moderate-intensity exercise. ? Try to do strengthening exercises at least twice each week. Do these in addition to the moderate-intensity exercise.  Know your numbers.Ask your health care provider to check your cholesterol and your blood glucose. Continue to have your blood tested as directed by your health care provider.  What should I know about cancer screening? There are several types of cancer. Take the following steps to reduce your risk and to catch any cancer development as early as possible. Breast Cancer  Practice breast self-awareness. ? This means understanding how your breasts normally appear and feel. ? It also means doing regular breast  self-exams. Let your health care provider know about any changes, no matter how small.  If you are 59 or older, have a clinician do a breast exam (clinical breast exam or CBE) every year. Depending on your age, family history, and medical  history, it may be recommended that you also have a yearly breast X-ray (mammogram).  If you have a family history of breast cancer, talk with your health care provider about genetic screening.  If you are at high risk for breast cancer, talk with your health care provider about having an MRI and a mammogram every year.  Breast cancer (BRCA) gene test is recommended for women who have family members with BRCA-related cancers. Results of the assessment will determine the need for genetic counseling and BRCA1 and for BRCA2 testing. BRCA-related cancers include these types: ? Breast. This occurs in males or females. ? Ovarian. ? Tubal. This may also be called fallopian tube cancer. ? Cancer of the abdominal or pelvic lining (peritoneal cancer). ? Prostate. ? Pancreatic.  Cervical, Uterine, and Ovarian Cancer Your health care provider may recommend that you be screened regularly for cancer of the pelvic organs. These include your ovaries, uterus, and vagina. This screening involves a pelvic exam, which includes checking for microscopic changes to the surface of your cervix (Pap test).  For women ages 21-65, health care providers may recommend a pelvic exam and a Pap test every three years. For women ages 81-65, they may recommend the Pap test and pelvic exam, combined with testing for human papilloma virus (HPV), every five years. Some types of HPV increase your risk of cervical cancer. Testing for HPV may also be done on women of any age who have unclear Pap test results.  Other health care providers may not recommend any screening for nonpregnant women who are considered low risk for pelvic cancer and have no symptoms. Ask your health care provider if a screening pelvic exam is right for you.  If you have had past treatment for cervical cancer or a condition that could lead to cancer, you need Pap tests and screening for cancer for at least 20 years after your treatment. If Pap tests have been  discontinued for you, your risk factors (such as having a new sexual partner) need to be reassessed to determine if you should start having screenings again. Some women have medical problems that increase the chance of getting cervical cancer. In these cases, your health care provider may recommend that you have screening and Pap tests more often.  If you have a family history of uterine cancer or ovarian cancer, talk with your health care provider about genetic screening.  If you have vaginal bleeding after reaching menopause, tell your health care provider.  There are currently no reliable tests available to screen for ovarian cancer.  Lung Cancer Lung cancer screening is recommended for adults 12-86 years old who are at high risk for lung cancer because of a history of smoking. A yearly low-dose CT scan of the lungs is recommended if you:  Currently smoke.  Have a history of at least 30 pack-years of smoking and you currently smoke or have quit within the past 15 years. A pack-year is smoking an average of one pack of cigarettes per day for one year.  Yearly screening should:  Continue until it has been 15 years since you quit.  Stop if you develop a health problem that would prevent you from having lung cancer treatment.  Colorectal Cancer  This  type of cancer can be detected and can often be prevented.  Routine colorectal cancer screening usually begins at age 57 and continues through age 66.  If you have risk factors for colon cancer, your health care provider may recommend that you be screened at an earlier age.  If you have a family history of colorectal cancer, talk with your health care provider about genetic screening.  Your health care provider may also recommend using home test kits to check for hidden blood in your stool.  A small camera at the end of a tube can be used to examine your colon directly (sigmoidoscopy or colonoscopy). This is done to check for the earliest  forms of colorectal cancer.  Direct examination of the colon should be repeated every 5-10 years until age 67. However, if early forms of precancerous polyps or small growths are found or if you have a family history or genetic risk for colorectal cancer, you may need to be screened more often.  Skin Cancer  Check your skin from head to toe regularly.  Monitor any moles. Be sure to tell your health care provider: ? About any new moles or changes in moles, especially if there is a change in a mole's shape or color. ? If you have a mole that is larger than the size of a pencil eraser.  If any of your family members has a history of skin cancer, especially at a young age, talk with your health care provider about genetic screening.  Always use sunscreen. Apply sunscreen liberally and repeatedly throughout the day.  Whenever you are outside, protect yourself by wearing long sleeves, pants, a wide-brimmed hat, and sunglasses.  What should I know about osteoporosis? Osteoporosis is a condition in which bone destruction happens more quickly than new bone creation. After menopause, you may be at an increased risk for osteoporosis. To help prevent osteoporosis or the bone fractures that can happen because of osteoporosis, the following is recommended:  If you are 51-59 years old, get at least 1,000 mg of calcium and at least 600 mg of vitamin D per day.  If you are older than age 38 but younger than age 11, get at least 1,200 mg of calcium and at least 600 mg of vitamin D per day.  If you are older than age 22, get at least 1,200 mg of calcium and at least 800 mg of vitamin D per day.  Smoking and excessive alcohol intake increase the risk of osteoporosis. Eat foods that are rich in calcium and vitamin D, and do weight-bearing exercises several times each week as directed by your health care provider. What should I know about how menopause affects my mental health? Depression may occur at any  age, but it is more common as you become older. Common symptoms of depression include:  Low or sad mood.  Changes in sleep patterns.  Changes in appetite or eating patterns.  Feeling an overall lack of motivation or enjoyment of activities that you previously enjoyed.  Frequent crying spells.  Talk with your health care provider if you think that you are experiencing depression. What should I know about immunizations? It is important that you get and maintain your immunizations. These include:  Tetanus, diphtheria, and pertussis (Tdap) booster vaccine.  Influenza every year before the flu season begins.  Pneumonia vaccine.  Shingles vaccine.  Your health care provider may also recommend other immunizations. This information is not intended to replace advice given to you by your  health care provider. Make sure you discuss any questions you have with your health care provider. Document Released: 09/05/2005 Document Revised: 02/01/2016 Document Reviewed: 04/17/2015 Elsevier Interactive Patient Education  2018 [employer].

## 2018-01-06 NOTE — Progress Notes (Signed)
[place] [facility] [address], [place], [place] [address] [phone]  Date:  01/06/2018   Name:  Debbie Hayes   DOB:  June 30, 1951   MRN:  [id number]  PCP:  Darreld Mclean, MD    Chief Complaint: Annual Exam (medication management)   History of Present Illness:  Debbie Hayes is a 67 y.o. very pleasant female patient who presents with the following:  Seen here just last week for concern of palpitations and thumb pain Here today for a CPE as required for the cone focus plan  From our visit in April: History of DM, OSA, Migraine HA, hypothyroidism Metformin 100 BID, januvia for her DM- under good control.  However, she notes that she used an SGLT2 inhibitor that they had on hand while she was out of the country not long ago, and was losing weight while taking this med.  Wonders if she could change her [place] for something in this class [employer]       Lab Results  Component Value Date   HGBA1C 6.6 (H) 05/21/2017     Her neurologist is Dr. Brett Fairy, from recent visit ) OSA on dental device. HST by dental provider.She loves her device. Dr Toy Cookey 2) Migraine chronic, 13- 15a month after ajovy and aimovig. Use. We will use two Emgaiity shots - and she may well need iv Depakote when in acute pain. Today I will give her 2 doses of EMGALITY. I reviewed her headache journal.   She just got back from [place] to visit her daughter [name] and grands We hope that her new HA medication will help her, but so far she continues to have some headaches  She has a colon scheduled for this fall as she is due Left hip pain- she has noted some left hip pain reminiscent of sciatica for some time, we got a plain film of her hip last October.  She is interested in doing some PT for this joint  She stopped NSAIDs last year - she cannot use them due to her headaches, NSAIDs then to make them worse.  She suffers from various  joint pains- neck, shoulders, back- which are ok when she is doing her normal activities at home but will flare up when she is more active or traveling.   She does use an occasional narcotic pill for neck pain, shoulder, and back pain- she has been relying on pain meds she has gotten in the past after various operations, etc.  However she is running low on these and would like to do a pain contract with me and get mediation as needed especially for her pains.    NCCSR: she last got hydrocodone from [place] last July, 12 pills, then 20 pills last January She does take 5 mg of ambien on a semi- regular basis   Lab Results  Component Value Date   HGBA1C 6.7 (H) 11/09/2017   We did add farxiga in April of this year for her DM  Mammo:5/19 Colon: 5/19 immum UTD except for shingrix  Dexa:  Just done last month  She did not lose any weight with farxiga and did get a UTI- would like to go back to Januiva 100 mg after all.  I will make this change for her today She continues to try and maintain her weight and A1c.  She would love to lose weight but has been trying for 10 years - now she just  wants to avoid gaining   Would like imitrex for her headaches   She is on 250 mg of trazodone at bedtime for sleep.  She would like to decrease but does not sleep well without it Patient Active Problem List   Diagnosis Date Noted  . Chronic migraine with aura 12/15/2017  . Diabetes mellitus due to underlying condition with complication ([place]) 66/44/0347  . 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 Arthralgia 12/15/2017  . OSA (obstructive sleep apnea) 06/26/2017  . Snoring 03/25/2016  . Palpitations 03/25/2016  . Sleep related headaches 03/25/2016  . Trigeminal anesthesia 11/26/2015  . Spondylolisthesis of lumbar region 08/09/2014  . Back pain 02/17/2014  . Macular degeneration 02/17/2014  . Other and unspecified hyperlipidemia 10/14/2013  . Onychomycosis  12/01/2012  . Arthritis 04/12/2012  . Hypothyroid 04/12/2012    Past Medical History:  Diagnosis Date  . Allergy   . Cancer [facility])    thyroid cancer  . Cataract   . Chronic migraine BOTOX INJECTION EVERY 3 MONTHS  . Chronic pain in right shoulder   . Constipation   . Diabetes mellitus   . Disorder of inner ear CAUSES VERTIGO OCCASIONALLY  . Fibromyalgia   . GERD (gastroesophageal reflux disease)   . Hemorrhoid   . History of bladder infections   . History of thyroid cancer 2009  S/P TOTAL THYROIDECTOMY AND RADIATION  . Hyperlipidemia   . Hypertension CARDIOLOGIST- DR Wynonia Lawman- WILL REQUEST LATE NOTE   DENIES S & S  . Hypothyroidism   . Insomnia   . Left shoulder pain   . Macular degeneration   . Normal nuclear stress test 01-25-2009  . OA (osteoarthritis) JOINTS  . Osteopenia   . PONV (postoperative nausea and vomiting) SEVERE  . Rotator cuff disorder LEFT SHOULDER RTC IMPINGMENT  . Sleep apnea    mouth guard, no cpap   . Sleep apnea in adult 06/26/2017  . Spondylolisthesis of lumbar region   . 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 Wears glasses     Past Surgical History:  Procedure Laterality Date  . BACK SURGERY     Dr Arnoldo Morale- spondylosis  . BILATERAL CARPAL TUNNEL RELEASE  1994  . BILATERAL ELBOW SURG.  1999  . BILATERAL SALPINGOOPHECTOMY  1993   POST-OP URETER REPAIR 12 DAYS AFTER   . COLONOSCOPY    . FOOT ARTHRODESIS Left 07/10/2016   Procedure: LEFT 2ND TARSAL METATARSAL ARTHRODESIS  GASTROC RECESSION LEFT LAPIDUS MODIFIED MCBRIDE BUIONECTOMY;  Surgeon: Wylene Simmer, MD;  Location: [facility];  Service: Orthopedics;  Laterality: Left;  Marland Kitchen GASTROC RECESSION EXTREMITY Left 07/10/2016   Procedure: GASTROC RECESSION EXTREMITY;  Surgeon: Wylene Simmer, MD;  Location: [facility];  Service: Orthopedics;  Laterality: Left;  . LEFT THUMB JOINT REPLACEMENT  2005   RIGHT DONE IN 2004  . OOPHORECTOMY    . POLYPECTOMY    . RIGHT KNEE  ARTHROSCOPY  X2  BEFORE 2011  . RIGHT KNEE ARTHROSCOPY/ PARTIAL LATERAL MENISECTOMY/ TRICOMPARTMENT CHONDROPLASTY/ DECOMPRESSION CYST  10-26-2009  . RIGHT KNEE CLOSED MANIPULATION  09-11-2010  . RIGHT SHOULDER ARTHROSCOPY  2010  &  2004  . TIBIA CYST REMOVED AND ORIF LEG FX  1958  . TONSILLECTOMY  1968  . TOTAL KNEE ARTHROPLASTY  07-16-2010   RIGHT  . TOTAL THYROIDECTOMY  2009   CANCER  (POST-OP BLEED)  AND RADIATION [place]  . trigger fingers Right 2013   3rd and 4th fingers  .  UPPER GASTROINTESTINAL ENDOSCOPY    . VAGINAL HYSTERECTOMY  1990    Social History   Tobacco Use  . Smoking status: Never Smoker  . Smokeless tobacco: Never Used  Substance Use Topics  . Alcohol use: Yes    Comment: RARE  . Drug use: No    Family History  Problem Relation Age of Onset  . Colon polyps Sister 51  . Alzheimer's disease Mother   . Heart disease Father   . Cirrhosis Father   . Heart attack Father   . Macular degeneration Father   . Colon cancer Neg Hx     Allergies  Allergen Reactions  . Ciprofloxacin Diarrhea    Neurological effect   . Statins     Pt has tried multiple and cannot tolerate  . Sulfa Antibiotics Hives    Medication list has been reviewed and updated.  Current Outpatient Medications on File Prior to Visit  Medication Sig Dispense Refill  . acetaminophen (TYLENOL) 500 MG tablet Take 500 mg by mouth every 6 (six) hours as needed.    Marland Kitchen azelastine (ASTELIN) 0.1 % nasal spray PLACE 2 SPRAYS INTO BOTH NOSTRILS 2 TIMES DAILY AS DIRECTED 90 mL 0  . blood glucose meter kit and supplies KIT Test blood sugar once daily. Dx code: E11.9 1 each 0  . Blood Glucose Monitoring Suppl (FREESTYLE LITE) DEVI Check blood sugar no more than twice daily 1 each 0  . cetirizine (ZYRTEC) 10 MG tablet Take 10 mg by mouth daily as needed for allergies.     . dapagliflozin propanediol (FARXIGA) 5 MG TABS tablet Take 5 mg by mouth daily. 30 tablet 9  . Galcanezumab-gnlm (EMGALITY) 120 MG/ML SOAJ  Inject 1 pen into the skin every 30 (thirty) days. 1 pen 11  . glucose blood (FREESTYLE LITE) test strip Check blood sugars no more than twice daily 100 each 12  . HYDROcodone-acetaminophen (NORCO/VICODIN) 5-325 MG tablet Take 1 or 1.5 every 12 hours as needed for pain 40 tablet 0  . KRILL OIL PO Take by mouth.    . Lancets (FREESTYLE) lancets Check blood sugars no more than twice daily 100 each 12  . levothyroxine (SYNTHROID, LEVOTHROID) 137 MCG tablet Take 137 mcg by mouth daily before breakfast.    . losartan (COZAAR) 50 MG tablet TAKE 1 TABLET BY MOUTH DAILY. 90 tablet 3  . metaxalone (SKELAXIN) 800 MG tablet Take 1 tablet (800 mg total) by mouth 3 (three) times daily. 90 tablet 0  . metFORMIN (GLUCOPHAGE) 1000 MG tablet TAKE 1 TABLET BY MOUTH TWICE DAILY WITH A MEAL 180 tablet 0  . metoprolol succinate (TOPROL-XL) 25 MG 24 hr tablet Take 1 tablet (25 mg total) by mouth every morning. (Patient taking differently: Take 50 mg by mouth every morning. ) 90 tablet 3  . montelukast (SINGULAIR) 10 MG tablet TAKE 1 TABLET BY MOUTH AT BEDTIME. 90 tablet 3  . Multiple Vitamins-Minerals (ICAPS AREDS 2) CAPS Take by mouth.    Marland Kitchen NASONEX 50 MCG/ACT nasal spray PLACE 2 SPRAYS INTO THE NOSE DAILY. 17 g 9  . nitrofurantoin (MACRODANTIN) 50 MG capsule Take 50 mg by mouth at bedtime.     . polyethylene glycol (MIRALAX / GLYCOLAX) packet Take 17 g by mouth daily.    . traZODone (DESYREL) 100 MG tablet Take 2 tablets (200 mg total) by mouth at bedtime. (Patient taking differently: Take 250 mg by mouth at bedtime. ) 180 tablet 3  . zolpidem (AMBIEN) 5 MG tablet TAKE  1/2 TABLET BY MOUTH AT BEDTIME AS NEEDED FOR SLEEP 30 tablet 1   No current facility-administered medications on file prior to visit.     Review of Systems:  As per HPI- otherwise negative. No CP orSOB palpations that we discussed at last visit are much better on 50 mg of toprol xl    Physical Examination: Vitals:   01/06/18 1334  BP: 122/68   Pulse: 80  Resp: 16  SpO2: 97%   Vitals:   01/06/18 1334  Weight: 181 lb (82.1 kg)  Height: _0  (1.727 m)   Body mass index is 27.52 kg/m. Ideal Body Weight: Weight in (lb) to have BMI = 25: 164.1  GEN: WDWN, NAD, Non-toxic, A & O x 3, overweight, looks well  HEENT: Atraumatic, Normocephalic. Neck supple. No masses, No LAD.  Bilateral TM wnl, oropharynx normal.  PEERL,EOMI.   Ears and Nose: No external deformity. CV: RRR, No M/G/R. No JVD. No thrill. No extra heart sounds. PULM: CTA B, no wheezes, crackles, rhonchi. No retractions. No resp. distress. No accessory muscle use. ABD: S, NT, ND, EXTR: No c/c/e NEURO Normal gait.  PSYCH: Normally interactive. Conversant. Not depressed or anxious appearing.  Calm demeanor.    Assessment and Plan: Physical exam  Essential hypertension - Plan: metoprolol succinate (TOPROL-XL) 50 MG 24 hr tablet  Diabetes mellitus without complication (HCC) - Plan: sitaGLIPtin (JANUVIA) 100 MG tablet  Insomnia, unspecified type - Plan: traZODone (DESYREL) 100 MG tablet  Intractable chronic migraine without aura and with status migrainosus - Plan: SUMAtriptan (IMITREX) 100 MG tablet  Physical exam today Palpitations are controled with toprol 50 mg; she wishes to cancel her holter which is fine Change back to [place] as farxiga caused UTI Refilled meds as above Asked her to come back in about 4-6 months to repeat full fasting labs Discussed shingrix   Signed Lamar Blinks, MD

## 2018-01-20 ENCOUNTER — Encounter: Payer: Self-pay | Admitting: Neurology

## 2018-01-20 ENCOUNTER — Ambulatory Visit: Payer: 59 | Admitting: Neurology

## 2018-01-20 VITALS — BP 114/62 | HR 78

## 2018-01-20 DIAGNOSIS — G43711 Chronic migraine without aura, intractable, with status migrainosus: Secondary | ICD-10-CM | POA: Diagnosis not present

## 2018-01-20 NOTE — Progress Notes (Signed)
Botox- 100 units x 2 vials Lot: [id number] Expiration: 01/22 NDC: 7824-2353-61  Bacteriostatic 0.9% Sodium Chloride- 21mL total Lot: [id number] Expiration: 11/26/2018 NDC: 0086-7619-50  Dx: D32.671 B/B

## 2018-01-20 NOTE — Progress Notes (Signed)
This is her first botox injection.  Consent Form Botulism Toxin Injection For Chronic Migraine    Reviewed orally with patient, additionally signature is on file:  Botulism toxin has been approved by the Federal drug administration for treatment of chronic migraine. Botulism toxin does not cure chronic migraine and it may not be effective in some patients.  The administration of botulism toxin is accomplished by injecting a small amount of toxin into the muscles of the neck and head. Dosage must be titrated for each individual. Any benefits resulting from botulism toxin tend to wear off after 3 months with a repeat injection required if benefit is to be maintained. Injections are usually done every 3-4 months with maximum effect peak achieved by about 2 or 3 weeks. Botulism toxin is expensive and you should be sure of what costs you will incur resulting from the injection.  The side effects of botulism toxin use for chronic migraine may include:   -Transient, and usually mild, facial weakness with facial injections  -Transient, and usually mild, head or neck weakness with head/neck injections  -Reduction or loss of forehead facial animation due to forehead muscle weakness  -Eyelid drooping  -Dry eye  -Pain at the site of injection or bruising at the site of injection  -Double vision  -Potential unknown long term risks  Contraindications: You should not have Botox if you are pregnant, nursing, allergic to albumin, have an infection, skin condition, or muscle weakness at the site of the injection, or have myasthenia gravis, Lambert-Eaton syndrome, or ALS.  It is also possible that as with any injection, there may be an allergic reaction or no effect from the medication. Reduced effectiveness after repeated injections is sometimes seen and rarely infection at the injection site may occur. All care will be taken to prevent these side effects. If therapy is given over a long time, atrophy  and wasting in the muscle injected may occur. Occasionally the patient's become refractory to treatment because they develop antibodies to the toxin. In this event, therapy needs to be modified.  I have read the above information and consent to the administration of botulism toxin.    BOTOX PROCEDURE NOTE FOR MIGRAINE HEADACHE    Contraindications and precautions discussed with patient(above). Aseptic procedure was observed and patient tolerated procedure. Procedure performed by Dr. Georgia Dom  The condition has existed for more than 6 months, and pt does not have a diagnosis of ALS, Myasthenia Gravis or Lambert-Eaton Syndrome.  Risks and benefits of injections discussed and pt agrees to proceed with the procedure.  Written consent obtained  These injections are medically necessary. Pt  receives good benefits from these injections. These injections do not cause sedations or hallucinations which the oral therapies may cause.  Indication/Diagnosis: chronic migraine BOTOX(J0585) injection was performed according to protocol by [employer]. 200 units of BOTOX was dissolved into 4 cc NS.   NDC: [address]-94   Description of procedure:  The patient was placed in a sitting position. The standard protocol was used for Botox as follows, with 5 units of Botox injected at each site:   -Procerus muscle, midline injection  -Corrugator muscle, bilateral injection  -Frontalis muscle, bilateral injection, with 2 sites each side, medial injection was performed in the upper one third of the frontalis muscle, in the region vertical from the medial inferior edge of the superior orbital rim. The lateral injection was again in the upper one third of the forehead vertically above the lateral limbus  of the cornea, 1.5 cm lateral to the medial injection site.  -Temporalis muscle injection, 4 sites, bilaterally. The first injection was 3 cm above the tragus of the ear, second injection site was 1.5 cm to 3 cm up  from the first injection site in line with the tragus of the ear. The third injection site was 1.5-3 cm forward between the first 2 injection sites. The fourth injection site was 1.5 cm posterior to the second injection site.  -Occipitalis muscle injection, 3 sites, bilaterally. The first injection was done one half way between the occipital protuberance and the tip of the mastoid process behind the ear. The second injection site was done lateral and superior to the first, 1 fingerbreadth from the first injection. The third injection site was 1 fingerbreadth superiorly and medially from the first injection site.  -Cervical paraspinal muscle injection, 2 sites, bilateral knee first injection site was 1 cm from the midline of the cervical spine, 3 cm inferior to the lower border of the occipital protuberance. The second injection site was 1.5 cm superiorly and laterally to the first injection site.  -Trapezius muscle injection was performed at 3 sites, bilaterally. The first injection site was in the upper trapezius muscle halfway between the inflection point of the neck, and the acromion. The second injection site was one half way between the acromion and the first injection site. The third injection was done between the first injection site and the inflection point of the neck.   Will return for repeat injection in 3 months.   A 200 unit sof Botox was used, 155 units were injected, the rest of the Botox was wasted. The patient tolerated the procedure well, there were no complications of the above procedure.

## 2018-01-22 DIAGNOSIS — H35453 Secondary pigmentary degeneration, bilateral: Secondary | ICD-10-CM | POA: Diagnosis not present

## 2018-01-22 DIAGNOSIS — H43813 Vitreous degeneration, bilateral: Secondary | ICD-10-CM | POA: Diagnosis not present

## 2018-01-22 DIAGNOSIS — Z7984 Long term (current) use of oral hypoglycemic drugs: Secondary | ICD-10-CM | POA: Diagnosis not present

## 2018-01-22 DIAGNOSIS — E119 Type 2 diabetes mellitus without complications: Secondary | ICD-10-CM | POA: Diagnosis not present

## 2018-01-22 DIAGNOSIS — H2513 Age-related nuclear cataract, bilateral: Secondary | ICD-10-CM | POA: Diagnosis not present

## 2018-01-22 DIAGNOSIS — H16223 Keratoconjunctivitis sicca, not specified as Sjogren's, bilateral: Secondary | ICD-10-CM | POA: Diagnosis not present

## 2018-01-22 DIAGNOSIS — H04123 Dry eye syndrome of bilateral lacrimal glands: Secondary | ICD-10-CM | POA: Diagnosis not present

## 2018-01-22 LAB — HM DIABETES EYE EXAM

## 2018-01-25 MED FILL — traZODone HCL 100 MG TABS: 100 | 90 days supply | Qty: 225 | Fill #0

## 2018-01-29 ENCOUNTER — Encounter: Payer: Self-pay | Admitting: Family Medicine

## 2018-01-29 DIAGNOSIS — J3089 Other allergic rhinitis: Secondary | ICD-10-CM

## 2018-01-30 ENCOUNTER — Encounter: Payer: Self-pay | Admitting: Neurology

## 2018-02-01 DIAGNOSIS — Z978 Presence of other specified devices: Secondary | ICD-10-CM | POA: Diagnosis not present

## 2018-02-01 DIAGNOSIS — M79672 Pain in left foot: Secondary | ICD-10-CM | POA: Diagnosis not present

## 2018-02-01 MED ORDER — MONTELUKAST SODIUM 10 MG PO TABS: ORAL_TABLET | ORAL | 1 refills | Status: DC

## 2018-02-01 MED FILL — SUMATRIPTAN SUCC 100 MG TAB: 100 | 30 days supply | Qty: 9 | Fill #1

## 2018-02-01 MED FILL — NITROFURANTOIN MCR 50 MG CA: 50 | 30 days supply | Qty: 30 | Fill #2

## 2018-02-02 ENCOUNTER — Encounter: Payer: Self-pay | Admitting: Family Medicine

## 2018-02-02 DIAGNOSIS — M79646 Pain in unspecified finger(s): Secondary | ICD-10-CM

## 2018-02-04 ENCOUNTER — Other Ambulatory Visit: Payer: Self-pay | Admitting: Orthopedic Surgery

## 2018-02-04 DIAGNOSIS — Z978 Presence of other specified devices: Secondary | ICD-10-CM

## 2018-02-15 ENCOUNTER — Ambulatory Visit
Admission: RE | Admit: 2018-02-15 | Discharge: 2018-02-15 | Disposition: A | Discharge status: Home / Self Care | Payer: 59 | Source: Ambulatory Visit | Attending: Orthopedic Surgery | Admitting: Orthopedic Surgery

## 2018-02-15 DIAGNOSIS — Z978 Presence of other specified devices: Secondary | ICD-10-CM

## 2018-02-15 DIAGNOSIS — M19072 Primary osteoarthritis, left ankle and foot: Secondary | ICD-10-CM | POA: Diagnosis not present

## 2018-02-17 ENCOUNTER — Other Ambulatory Visit: Payer: Self-pay | Admitting: Family Medicine

## 2018-02-17 DIAGNOSIS — Z978 Presence of other specified devices: Secondary | ICD-10-CM | POA: Insufficient documentation

## 2018-02-17 DIAGNOSIS — E039 Hypothyroidism, unspecified: Secondary | ICD-10-CM

## 2018-02-17 DIAGNOSIS — E088 Diabetes mellitus due to underlying condition with unspecified complications: Secondary | ICD-10-CM

## 2018-02-17 MED FILL — LOSARTAN POTASSIUM 50 MG TA: 50 | 90 days supply | Qty: 90 | Fill #1

## 2018-02-17 MED FILL — EMGALITY 120 MG/ML SOAJ: 120 | 30 days supply | Qty: 1 | Fill #2

## 2018-02-18 MED ORDER — LEVOTHYROXINE SODIUM 137 MCG PO TABS: 137.0000 ug | ORAL_TABLET | Freq: Every day | ORAL | 1 refills | Status: DC

## 2018-02-18 MED FILL — ZOLPIDEM TARTRATE 5 MG TAB: 5 | 60 days supply | Qty: 30 | Fill #0

## 2018-02-18 MED FILL — metFORMIN HCL 1000 MG TABS: 1000 | 90 days supply | Qty: 180 | Fill #0

## 2018-02-18 NOTE — Telephone Encounter (Signed)
Refill request for Ambien.

## 2018-02-19 ENCOUNTER — Encounter: Payer: Self-pay | Admitting: Family Medicine

## 2018-02-22 MED ORDER — LEVOTHYROXINE SODIUM 125 MCG PO TABS: 137.0000 ug | ORAL_TABLET | Freq: Every day | ORAL | 3 refills | Status: DC

## 2018-02-22 MED FILL — LEVOTHYROXINE 125 MCG TABLE: 125 | 90 days supply | Qty: 90 | Fill #0

## 2018-02-24 ENCOUNTER — Other Ambulatory Visit: Payer: Self-pay | Admitting: Family Medicine

## 2018-02-24 DIAGNOSIS — J3089 Other allergic rhinitis: Secondary | ICD-10-CM

## 2018-02-25 MED FILL — MONTELUKAST SOD 10 MG TAB: 10 | 90 days supply | Qty: 90 | Fill #0

## 2018-03-01 ENCOUNTER — Other Ambulatory Visit: Payer: Self-pay | Admitting: Internal Medicine

## 2018-03-01 ENCOUNTER — Ambulatory Visit: Payer: 59 | Admitting: Neurology

## 2018-03-01 MED FILL — SUMAtriptan SUCCINATE 100 M: 100 | 30 days supply | Qty: 9 | Fill #2

## 2018-03-08 ENCOUNTER — Other Ambulatory Visit (HOSPITAL_COMMUNITY): Payer: Self-pay | Admitting: Orthopedic Surgery

## 2018-03-09 ENCOUNTER — Encounter: Payer: Self-pay | Admitting: Family Medicine

## 2018-03-15 ENCOUNTER — Ambulatory Visit: Payer: 59 | Admitting: Family Medicine

## 2018-03-15 MED FILL — EMGALITY 120 MG/ML SOAJ: 120 | 30 days supply | Qty: 1 | Fill #3

## 2018-03-15 MED FILL — NITROFURANTOIN MCR 50 MG CA: 50 | 30 days supply | Qty: 30 | Fill #3

## 2018-03-17 ENCOUNTER — Ambulatory Visit: Payer: 59 | Admitting: Neurology

## 2018-03-24 MED FILL — RIZATRIPTAN BENZOATE 10 MG: 10 | 30 days supply | Qty: 10 | Fill #1

## 2018-04-06 MED FILL — JANUVIA 100 MG TABLET: 100 | 60 days supply | Qty: 60 | Fill #1

## 2018-04-06 MED FILL — FREESTYLE LITE TEST STRIP: 50 days supply | Qty: 100 | Fill #1

## 2018-04-07 ENCOUNTER — Telehealth: Payer: Self-pay | Admitting: *Deleted

## 2018-04-07 NOTE — Telephone Encounter (Signed)
Completed Physician section on Disability Parking Placard Application, patient had included SAS envelope to [facility] and a Personal Check to send with application; forwarded to provider for signature and to inquire as what to do with check, as I was not comfortable even having in our possession. Dr. Lorelei Pont signed application, took picture of check and is going to call patient and let her know that we could not send the check but she can p/u the application; completed application form is at our front desk and ready for p/u during regular business hours/SLS 09/11

## 2018-04-12 ENCOUNTER — Telehealth: Payer: Self-pay

## 2018-04-12 MED FILL — METOPROLOL SUCCINATE ER 50: 50 | 90 days supply | Qty: 90 | Fill #1

## 2018-04-12 MED FILL — NITROFURANTOIN MCR 50 MG CA: 50 | 30 days supply | Qty: 30 | Fill #4

## 2018-04-12 MED FILL — EMGALITY 120 MG/ML SOAJ: 120 | 30 days supply | Qty: 1 | Fill #4

## 2018-04-12 NOTE — Telephone Encounter (Signed)
Rx requests for ondansetron and flector 1.3% patch received via fax from [place]. Neither medication on pt's active med list. Routed to Dr. Lorelei Pont to review.

## 2018-04-13 ENCOUNTER — Other Ambulatory Visit: Payer: Self-pay

## 2018-04-13 MED ORDER — DICLOFENAC EPOLAMINE 1.3 % TD PTCH: MEDICATED_PATCH | TRANSDERMAL | 99 refills | Status: DC

## 2018-04-13 MED ORDER — ONDANSETRON HCL 4 MG PO TABS: ORAL_TABLET | ORAL | 1 refills | Status: DC

## 2018-04-13 MED FILL — ONDANSETRON HCL 4 MG TABLET: 4 | 10 days supply | Qty: 30 | Fill #0

## 2018-04-13 MED FILL — DICLOFENAC EPOLAMINE 1.3 %: 1.3 | 90 days supply | Qty: 180 | Fill #0

## 2018-04-13 NOTE — Telephone Encounter (Signed)
Medications not on patient's current med list. Please advise.

## 2018-04-15 ENCOUNTER — Ambulatory Visit: Payer: 59 | Admitting: Neurology

## 2018-04-17 NOTE — Progress Notes (Addendum)
[facility] [address] [place], [place] [address] [phone]  Date:  04/19/2018   Name:  Debbie Hayes   DOB:  1951/01/22   MRN:  [id number]  PCP:  Darreld Mclean, MD    Chief Complaint: Leg Pain (left leg pain); Lab Work (lab work for diabetes); and Back Pain (had surgery lumbar spine, lower-mid back pain, no improvement, couple of weeks)   History of Present Illness:  Debbie Hayes is a 67 y.o. very pleasant female patient who presents with the following:  Following up today- last seen by myself in June History of DM She is not fasting today- we will check her lipids today however as she would like to see if the krill oil she is taking is making any difference Flu shot due  Lab Results  Component Value Date   HGBA1C 6.7 (H) 11/09/2017   Admits that she is not eating as well as she may have in the past- however her weight is stable.  She is not sure if her A1c will be different  Her daughter is having a knee replacement coming up   She has periodic fibromyalgia flares and seems to be having a problem with her mid back for the last 2 weeks.  She thinks it started when she leaned over to look at some sheet music at church.  The pain runs in a band over her lower thoracic back, on both sides.  It does not run down her legs, no numbness, weakness or change in bladder function No SOB or chest pain   Her husband- Ruben Reason MD- thought that this was muscular pain She is using emgality and botox- this has cut down on her headaches but quite a bit!  However she is not supposed to use OTC pain meds with this treatment regiment I called her in some flector patches to use prn   Wt Readings from Last 3 Encounters:  04/19/18 178 lb (80.7 kg)  01/06/18 181 lb (82.1 kg)  12/30/17 181 lb (82.1 kg)   She has allergies manifest as runny and congested nose  She is using singulair and also nasonex, 2nd  generation oral antihistamine.  However shes still has sx.  Wonders if anything else might be helpful.  Discussed immunotherapy, atrovent nasal.  For now she prefer to monitor her sx and see how she does   Patient Active Problem List   Diagnosis Date Noted  . Chronic migraine with aura 12/15/2017  . Diabetes mellitus due to underlying condition with complication ([place]) 41/28/7867  . 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 Arthralgia 12/15/2017  . OSA (obstructive sleep apnea) 06/26/2017  . Snoring 03/25/2016  . Palpitations 03/25/2016  . Sleep related headaches 03/25/2016  . Trigeminal anesthesia 11/26/2015  . Spondylolisthesis of lumbar region 08/09/2014  . Back pain 02/17/2014  . Macular degeneration 02/17/2014  . Other and unspecified hyperlipidemia 10/14/2013  . Onychomycosis 12/01/2012  . Arthritis 04/12/2012  . Hypothyroid 04/12/2012    Past Medical History:  Diagnosis Date  . Allergy   . Cancer [facility])    thyroid cancer  . Cataract   . Chronic migraine BOTOX INJECTION EVERY 3 MONTHS  . Chronic pain in right shoulder   . Constipation   . Diabetes mellitus   . Disorder of inner ear CAUSES VERTIGO OCCASIONALLY  . Fibromyalgia   . GERD (gastroesophageal reflux disease)   .  Hemorrhoid   . History of bladder infections   . History of thyroid cancer 2009  S/P TOTAL THYROIDECTOMY AND RADIATION  . Hyperlipidemia   . Hypertension CARDIOLOGIST- DR Wynonia Lawman- WILL REQUEST LATE NOTE   DENIES S & S  . Hypothyroidism   . Insomnia   . Left shoulder pain   . Macular degeneration   . Normal nuclear stress test 01-25-2009  . OA (osteoarthritis) JOINTS  . Osteopenia   . PONV (postoperative nausea and vomiting) SEVERE  . Rotator cuff disorder LEFT SHOULDER RTC IMPINGMENT  . Sleep apnea    mouth guard, no cpap   . Sleep apnea in adult 06/26/2017  . Spondylolisthesis of lumbar region   . 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 Wears  glasses     Past Surgical History:  Procedure Laterality Date  . BACK SURGERY     Dr Arnoldo Morale- spondylosis  . BILATERAL CARPAL TUNNEL RELEASE  1994  . BILATERAL ELBOW SURG.  1999  . BILATERAL SALPINGOOPHECTOMY  1993   POST-OP URETER REPAIR 12 DAYS AFTER   . COLONOSCOPY    . FOOT ARTHRODESIS Left 07/10/2016   Procedure: LEFT 2ND TARSAL METATARSAL ARTHRODESIS  GASTROC RECESSION LEFT LAPIDUS MODIFIED MCBRIDE BUIONECTOMY;  Surgeon: Wylene Simmer, MD;  Location: [facility];  Service: Orthopedics;  Laterality: Left;  Marland Kitchen GASTROC RECESSION EXTREMITY Left 07/10/2016   Procedure: GASTROC RECESSION EXTREMITY;  Surgeon: Wylene Simmer, MD;  Location: [facility];  Service: Orthopedics;  Laterality: Left;  . LEFT THUMB JOINT REPLACEMENT  2005   RIGHT DONE IN 2004  . OOPHORECTOMY    . POLYPECTOMY    . RIGHT KNEE ARTHROSCOPY  X2  BEFORE 2011  . RIGHT KNEE ARTHROSCOPY/ PARTIAL LATERAL MENISECTOMY/ TRICOMPARTMENT CHONDROPLASTY/ DECOMPRESSION CYST  10-26-2009  . RIGHT KNEE CLOSED MANIPULATION  09-11-2010  . RIGHT SHOULDER ARTHROSCOPY  2010  &  2004  . TIBIA CYST REMOVED AND ORIF LEG FX  1958  . TONSILLECTOMY  1968  . TOTAL KNEE ARTHROPLASTY  07-16-2010   RIGHT  . TOTAL THYROIDECTOMY  2009   CANCER  (POST-OP BLEED)  AND RADIATION [place]  . trigger fingers Right 2013   3rd and 4th fingers  . UPPER GASTROINTESTINAL ENDOSCOPY    . VAGINAL HYSTERECTOMY  1990    Social History   Tobacco Use  . Smoking status: Never Smoker  . Smokeless tobacco: Never Used  Substance Use Topics  . Alcohol use: Yes    Comment: RARE  . Drug use: No    Family History  Problem Relation Age of Onset  . Colon polyps Sister 71  . Alzheimer's disease Mother   . Heart disease Father   . Cirrhosis Father   . Heart attack Father   . Macular degeneration Father   . Colon cancer Neg Hx     Allergies  Allergen Reactions  . Ciprofloxacin Diarrhea    Neurological effect   . Statins     Pt has  tried multiple and cannot tolerate  . Sulfa Antibiotics Hives    Medication list has been reviewed and updated.  Current Outpatient Medications on File Prior to Visit  Medication Sig Dispense Refill  . acetaminophen (TYLENOL) 500 MG tablet Take 500 mg by mouth every 6 (six) hours as needed.    Marland Kitchen azelastine (ASTELIN) 0.1 % nasal spray PLACE 2 SPRAYS INTO BOTH NOSTRILS 2 TIMES DAILY AS DIRECTED 90 mL 0  . blood glucose meter kit and supplies KIT Test blood  sugar once daily. Dx code: E11.9 1 each 0  . Blood Glucose Monitoring Suppl (FREESTYLE LITE) DEVI Check blood sugar no more than twice daily 1 each 0  . cetirizine (ZYRTEC) 10 MG tablet Take 10 mg by mouth daily as needed for allergies.     Marland Kitchen diclofenac (FLECTOR) 1.3 % PTCH PLACE 1 PATCH ONTO THE SKIN 2 TIMES DAILY. 180 patch PRN  . Galcanezumab-gnlm (EMGALITY) 120 MG/ML SOAJ Inject 1 pen into the skin every 30 (thirty) days. 1 pen 11  . glucose blood (FREESTYLE LITE) test strip Check blood sugars no more than twice daily 100 each 12  . HYDROcodone-acetaminophen (NORCO/VICODIN) 5-325 MG tablet Take 1 or 1.5 every 12 hours as needed for pain 40 tablet 0  . KRILL OIL PO Take by mouth.    . Lancets (FREESTYLE) lancets Check blood sugars no more than twice daily 100 each 12  . levothyroxine (SYNTHROID, LEVOTHROID) 125 MCG tablet Take 1 tablet (125 mcg total) by mouth daily before breakfast. 90 tablet 3  . losartan (COZAAR) 50 MG tablet TAKE 1 TABLET BY MOUTH DAILY. 90 tablet 3  . metaxalone (SKELAXIN) 800 MG tablet TAKE 1 TABLET BY MOUTH 3 TIMES DAILY. 90 tablet 3  . metFORMIN (GLUCOPHAGE) 1000 MG tablet Take 1 tablet (1,000 mg total) by mouth 2 (two) times daily with a meal. 180 tablet 1  . metoprolol succinate (TOPROL-XL) 50 MG 24 hr tablet Take 1 tablet (50 mg total) by mouth every morning. 90 tablet 3  . minoxidil (ROGAINE) 2 % external solution Apply topically 2 (two) times daily.    . montelukast (SINGULAIR) 10 MG tablet TAKE 1 TABLET BY  MOUTH AT BEDTIME. 90 tablet 1  . montelukast (SINGULAIR) 10 MG tablet TAKE 1 TABLET BY MOUTH AT BEDTIME. 90 tablet 3  . Multiple Vitamins-Minerals (ICAPS AREDS 2) CAPS Take by mouth.    Marland Kitchen NASONEX 50 MCG/ACT nasal spray PLACE 2 SPRAYS INTO THE NOSE DAILY. 17 g 9  . nitrofurantoin (MACRODANTIN) 50 MG capsule Take 50 mg by mouth at bedtime.     . ondansetron (ZOFRAN) 4 MG tablet TAKE 1 TABLET BY MOUTH EVERY 8 HOURS AS NEEDED FOR NAUSEA AND VOMITING 30 tablet 1  . polyethylene glycol (MIRALAX / GLYCOLAX) packet Take 17 g by mouth daily.    . sitaGLIPtin (JANUVIA) 100 MG tablet Take 1 tablet (100 mg total) by mouth daily. 90 tablet 3  . traZODone (DESYREL) 100 MG tablet Take 2.5 tablets (250 mg total) by mouth at bedtime. 225 tablet 3  . zolpidem (AMBIEN) 5 MG tablet TAKE 1/2 TABLET BY MOUTH AT BEDTIME AS NEEDED FOR SLEEP 30 tablet 1  . SUMAtriptan (IMITREX) 100 MG tablet Take 1 tablet (100 mg total) by mouth once for 1 dose. May repeat in 2 hours if headache persists or recurs. 10 tablet 5   No current facility-administered medications on file prior to visit.     Review of Systems:  As per HPI- otherwise negative. No fever or chills  Physical Examination: Vitals:   04/19/18 1537  BP: 122/82  Pulse: 74  Resp: 16  Temp: 98.1 F (36.7 C)  SpO2: 97%   Vitals:   04/19/18 1537  Weight: 178 lb (80.7 kg)  Height: _0  (1.727 m)   Body mass index is 27.06 kg/m. Ideal Body Weight: Weight in (lb) to have BMI = 25: 164.1  GEN: WDWN, NAD, Non-toxic, A & O x 3, overweight, looks well  HEENT: Atraumatic, Normocephalic. Neck supple.  No masses, No LAD.  Bilateral TM wnl, oropharynx normal.  PEERL,EOMI.   Ears and Nose: No external deformity. CV: RRR, No M/G/R. No JVD. No thrill. No extra heart sounds. PULM: CTA B, no wheezes, crackles, rhonchi. No retractions. No resp. distress. No accessory muscle use. ABD: S, NT, ND, +BS. No rebound. No HSM. EXTR: No c/c/e NEURO Normal gait.  PSYCH:  Normally interactive. Conversant. Not depressed or anxious appearing.  Calm demeanor.  She indicated a band like area across her lower thoracic back as her area of concern.  The muscles in this distribution are tender to palpation Normal BLE strength, sensation and negative SLR    Assessment and Plan: Essential hypertension - Plan: CBC  Diabetes mellitus without complication (HCC) - Plan: Hemoglobin A1c, CBC, Comprehensive metabolic panel, Lipid panel  Mid back pain - Plan: predniSONE (DELTASONE) 20 MG tablet, CANCELED: DG Thoracic Spine 2 View  Check on DM today with A1c Also follow-up on lipids Lower thoracic pain.  Our options for treatment are somewhat limited by her migraine regimen.  Discussed trying prednisone and she would like to try this.  rx for pred as follows, she will monitor her sugars while on this medication.  Also obtain plain thoracic spine films today   Signed Lamar Blinks, MD Need to compare with past lipids to see if krill oil is helping or not    Dg Thoracic Spine W/swimmers  Result Date: 04/19/2018 CLINICAL DATA:  67 year-old female c/o lower thoracic spine pain x 2 weeks. Pt states she was leaning to look at something and felt pain. NKI EXAM: THORACIC SPINE - 3 VIEWS COMPARISON:  05/11/2017 FINDINGS: There are mild degenerative changes in the thoracic spine. Subtle scoliosis of the UPPER thoracic spine, convex towards the LEFT. No acute fracture or subluxation. No lytic or blastic lesions. IMPRESSION: No evidence for acute  abnormality. Electronically Signed   By: Nolon Nations M.D.   On: 04/19/2018 16:25   Message to pt re: films   Received her labs 9/24  Results for orders placed or performed in visit on 04/19/18  Hemoglobin A1c  Result Value Ref Range   Hgb A1c MFr Bld 6.9 (H) 4.6 - 6.5 %  CBC  Result Value Ref Range   WBC 5.3 4.0 - 10.5 K/uL   RBC 4.69 3.87 - 5.11 Mil/uL   Platelets 219.0 150.0 - 400.0 K/uL   Hemoglobin 12.4 12.0 - 15.0 g/dL    HCT 37.8 36.0 - 46.0 %   MCV 80.5 78.0 - 100.0 fl   MCHC 32.9 30.0 - 36.0 g/dL   RDW 15.3 11.5 - 15.5 %  Comprehensive metabolic panel  Result Value Ref Range   Sodium 138 135 - 145 mEq/L   Potassium 3.9 3.5 - 5.1 mEq/L   Chloride 101 96 - 112 mEq/L   CO2 30 19 - 32 mEq/L   Glucose, Bld 111 (H) 70 - 99 mg/dL   BUN 13 6 - 23 mg/dL   Creatinine, Ser 0.51 0.40 - 1.20 mg/dL   Total Bilirubin 0.3 0.2 - 1.2 mg/dL   Alkaline Phosphatase 54 39 - 117 U/L   AST 12 0 - 37 U/L   ALT 12 0 - 35 U/L   Total Protein 6.7 6.0 - 8.3 g/dL   Albumin 4.2 3.5 - 5.2 g/dL   Calcium 9.4 8.4 - 10.5 mg/dL   GFR 127.17 >60.00 mL/min  Lipid panel  Result Value Ref Range   Cholesterol 263 (H) 0 - 200  mg/dL   Triglycerides 134.0 0.0 - 149.0 mg/dL   HDL 48.70 >39.00 mg/dL   VLDL 26.8 0.0 - 40.0 mg/dL   LDL Cholesterol 187 (H) 0 - 99 mg/dL   Total CHOL/HDL Ratio 5    NonHDL 214.06

## 2018-04-19 ENCOUNTER — Ambulatory Visit: Payer: 59 | Admitting: Family Medicine

## 2018-04-19 ENCOUNTER — Other Ambulatory Visit: Payer: Self-pay | Admitting: Family Medicine

## 2018-04-19 ENCOUNTER — Encounter: Payer: Self-pay | Admitting: Family Medicine

## 2018-04-19 ENCOUNTER — Ambulatory Visit (HOSPITAL_BASED_OUTPATIENT_CLINIC_OR_DEPARTMENT_OTHER)
Admission: RE | Admit: 2018-04-19 | Discharge: 2018-04-19 | Disposition: A | Discharge status: Home / Self Care | Payer: 59 | Source: Ambulatory Visit | Attending: Family Medicine | Admitting: Family Medicine

## 2018-04-19 VITALS — BP 122/82 | HR 74 | Temp 98.1°F | Resp 16 | Ht 68.0 in | Wt 178.0 lb

## 2018-04-19 DIAGNOSIS — E119 Type 2 diabetes mellitus without complications: Secondary | ICD-10-CM | POA: Diagnosis not present

## 2018-04-19 DIAGNOSIS — M546 Pain in thoracic spine: Secondary | ICD-10-CM | POA: Diagnosis not present

## 2018-04-19 DIAGNOSIS — I10 Essential (primary) hypertension: Secondary | ICD-10-CM | POA: Diagnosis not present

## 2018-04-19 DIAGNOSIS — M549 Dorsalgia, unspecified: Secondary | ICD-10-CM | POA: Diagnosis not present

## 2018-04-19 MED ORDER — PREDNISONE 20 MG PO TABS: ORAL_TABLET | ORAL | 0 refills | Status: DC

## 2018-04-19 MED FILL — predniSONE 20 MG TABS: 20 | 10 days supply | Qty: 20 | Fill #0

## 2018-04-19 NOTE — Patient Instructions (Addendum)
Please come and see [place] for your flu shot after you finish the steroids- I hope that this may help with your back.  No NSAIDs while you are on the prednisone  I will be in touch with your labs asap Please keep an eye on your blood sugars while you are on the steroid  I will be in touch with your x-rays as well

## 2018-04-20 ENCOUNTER — Encounter: Payer: Self-pay | Admitting: Family Medicine

## 2018-04-20 DIAGNOSIS — M549 Dorsalgia, unspecified: Secondary | ICD-10-CM

## 2018-04-20 LAB — COMPREHENSIVE METABOLIC PANEL
ALT: 12 U/L (ref 0–35)
AST: 12 U/L (ref 0–37)
Albumin: 4.2 g/dL (ref 3.5–5.2)
Alkaline Phosphatase: 54 U/L (ref 39–117)
BILIRUBIN TOTAL: 0.3 mg/dL (ref 0.2–1.2)
BUN: 13 mg/dL (ref 6–23)
CALCIUM: 9.4 mg/dL (ref 8.4–10.5)
CO2: 30 meq/L (ref 19–32)
CREATININE: 0.51 mg/dL (ref 0.40–1.20)
Chloride: 101 mEq/L (ref 96–112)
GFR: 127.17 mL/min (ref >60.00)
Glucose, Bld: 111 mg/dL — ABNORMAL HIGH (ref 70–99)
Potassium: 3.9 mEq/L (ref 3.5–5.1)
SODIUM: 138 meq/L (ref 135–145)
Total Protein: 6.7 g/dL (ref 6.0–8.3)

## 2018-04-20 LAB — CBC
HCT: 37.8 % (ref 36.0–46.0)
Hemoglobin: 12.4 g/dL (ref 12.0–15.0)
MCHC: 32.9 g/dL (ref 30.0–36.0)
MCV: 80.5 fl (ref 78.0–100.0)
Platelets: 219 10*3/uL (ref 150.0–400.0)
RBC: 4.69 Mil/uL (ref 3.87–5.11)
RDW: 15.3 % (ref 11.5–15.5)
WBC: 5.3 10*3/uL (ref 4.0–10.5)

## 2018-04-20 LAB — HEMOGLOBIN A1C: HEMOGLOBIN A1C: 6.9 % — AB (ref 4.6–6.5)

## 2018-04-20 LAB — LIPID PANEL
Cholesterol: 263 mg/dL — ABNORMAL HIGH (ref 0–200)
HDL: 48.7 mg/dL (ref >39.00)
LDL CALC: 187 mg/dL — AB (ref 0–99)
NonHDL: 214.06
TRIGLYCERIDES: 134 mg/dL (ref 0.0–149.0)
Total CHOL/HDL Ratio: 5
VLDL: 26.8 mg/dL (ref 0.0–40.0)

## 2018-04-21 ENCOUNTER — Telehealth: Payer: Self-pay | Admitting: Neurology

## 2018-04-21 ENCOUNTER — Encounter (INDEPENDENT_AMBULATORY_CARE_PROVIDER_SITE_OTHER): Payer: 59 | Admitting: Ophthalmology

## 2018-04-21 DIAGNOSIS — Z23 Encounter for immunization: Secondary | ICD-10-CM | POA: Diagnosis not present

## 2018-04-21 NOTE — Telephone Encounter (Signed)
I called the patient's insurance and spoke with Chanata who stated that the Botox was covered and [place].   [id number]  [phone]  The patient will be B/B.

## 2018-04-22 MED FILL — METOPROLOL SUCCINATE ER 50: 50 | 60 days supply | Qty: 60 | Fill #2

## 2018-04-27 ENCOUNTER — Ambulatory Visit: Payer: 59 | Admitting: Neurology

## 2018-04-27 DIAGNOSIS — G43711 Chronic migraine without aura, intractable, with status migrainosus: Secondary | ICD-10-CM

## 2018-04-27 MED FILL — METAXALONE 800 MG TABLET: 800 | 30 days supply | Qty: 90 | Fill #0

## 2018-04-27 MED FILL — RIZATRIPTAN BENZOATE 10 MG: 10 | 30 days supply | Qty: 10 | Fill #2

## 2018-04-27 NOTE — Progress Notes (Signed)
Botox- 100 units x 2 vials Lot: [id number] Expiration: 09/2020 NDC: 7290-2111-55  Bacteriostatic 0.9% Sodium Chloride- 91mL total Lot: [id number] Expiration: 04/28/2019 NDC: 3361-2244-97  Dx: N30.051 B/B

## 2018-04-27 NOTE — Progress Notes (Addendum)
This is her second botox injection. She had tremendous improvement, this is her 18th day without a migraine. >70% improvement. No eyes, No masseters. +Temples. She had big spock eye brows so spread it out +2 units at the spock brow.   Consent Form Botulism Toxin Injection For Chronic Migraine    Reviewed orally with patient, additionally signature is on file:  Botulism toxin has been approved by the Federal drug administration for treatment of chronic migraine. Botulism toxin does not cure chronic migraine and it may not be effective in some patients.  The administration of botulism toxin is accomplished by injecting a small amount of toxin into the muscles of the neck and head. Dosage must be titrated for each individual. Any benefits resulting from botulism toxin tend to wear off after 3 months with a repeat injection required if benefit is to be maintained. Injections are usually done every 3-4 months with maximum effect peak achieved by about 2 or 3 weeks. Botulism toxin is expensive and you should be sure of what costs you will incur resulting from the injection.  The side effects of botulism toxin use for chronic migraine may include:   -Transient, and usually mild, facial weakness with facial injections  -Transient, and usually mild, head or neck weakness with head/neck injections  -Reduction or loss of forehead facial animation due to forehead muscle weakness  -Eyelid drooping  -Dry eye  -Pain at the site of injection or bruising at the site of injection  -Double vision  -Potential unknown long term risks  Contraindications: You should not have Botox if you are pregnant, nursing, allergic to albumin, have an infection, skin condition, or muscle weakness at the site of the injection, or have myasthenia gravis, Lambert-Eaton syndrome, or ALS.  It is also possible that as with any injection, there may be an allergic reaction or no effect from the medication. Reduced  effectiveness after repeated injections is sometimes seen and rarely infection at the injection site may occur. All care will be taken to prevent these side effects. If therapy is given over a long time, atrophy and wasting in the muscle injected may occur. Occasionally the patient's become refractory to treatment because they develop antibodies to the toxin. In this event, therapy needs to be modified.  I have read the above information and consent to the administration of botulism toxin.    BOTOX PROCEDURE NOTE FOR MIGRAINE HEADACHE    Contraindications and precautions discussed with patient(above). Aseptic procedure was observed and patient tolerated procedure. Procedure performed by Dr. Georgia Dom  The condition has existed for more than 6 months, and pt does not have a diagnosis of ALS, Myasthenia Gravis or Lambert-Eaton Syndrome.  Risks and benefits of injections discussed and pt agrees to proceed with the procedure.  Written consent obtained  These injections are medically necessary. Pt  receives good benefits from these injections. These injections do not cause sedations or hallucinations which the oral therapies may cause.  Indication/Diagnosis: chronic migraine BOTOX(J0585) injection was performed according to protocol by [employer]. 200 units of BOTOX was dissolved into 4 cc NS.   NDC: [address]-07   Description of procedure:  The patient was placed in a sitting position. The standard protocol was used for Botox as follows, with 5 units of Botox injected at each site:   -Procerus muscle, midline injection  -Corrugator muscle, bilateral injection  -Frontalis muscle, bilateral injection, with 2 sites each side, medial injection was performed in the upper one third  of the frontalis muscle, in the region vertical from the medial inferior edge of the superior orbital rim. The lateral injection was again in the upper one third of the forehead vertically above the lateral limbus of  the cornea, 1.5 cm lateral to the medial injection site.  -Temporalis muscle injection, 4 sites, bilaterally. The first injection was 3 cm above the tragus of the ear, second injection site was 1.5 cm to 3 cm up from the first injection site in line with the tragus of the ear. The third injection site was 1.5-3 cm forward between the first 2 injection sites. The fourth injection site was 1.5 cm posterior to the second injection site.  -Occipitalis muscle injection, 3 sites, bilaterally. The first injection was done one half way between the occipital protuberance and the tip of the mastoid process behind the ear. The second injection site was done lateral and superior to the first, 1 fingerbreadth from the first injection. The third injection site was 1 fingerbreadth superiorly and medially from the first injection site.  -Cervical paraspinal muscle injection, 2 sites, bilateral knee first injection site was 1 cm from the midline of the cervical spine, 3 cm inferior to the lower border of the occipital protuberance. The second injection site was 1.5 cm superiorly and laterally to the first injection site.  -Trapezius muscle injection was performed at 3 sites, bilaterally. The first injection site was in the upper trapezius muscle halfway between the inflection point of the neck, and the acromion. The second injection site was one half way between the acromion and the first injection site. The third injection was done between the first injection site and the inflection point of the neck.   Will return for repeat injection in 3 months.   A 200 unit sof Botox was used, 155 units were injected, the rest of the Botox was wasted. The patient tolerated the procedure well, there were no complications of the above procedure.

## 2018-05-10 ENCOUNTER — Encounter: Payer: Self-pay | Admitting: Physical Therapy

## 2018-05-10 ENCOUNTER — Ambulatory Visit: Payer: 59 | Attending: Family Medicine | Admitting: Physical Therapy

## 2018-05-10 DIAGNOSIS — M546 Pain in thoracic spine: Secondary | ICD-10-CM | POA: Diagnosis not present

## 2018-05-10 DIAGNOSIS — M62830 Muscle spasm of back: Secondary | ICD-10-CM | POA: Insufficient documentation

## 2018-05-10 MED FILL — traZODone HCL 100 MG TABS: 100 | 90 days supply | Qty: 225 | Fill #1

## 2018-05-10 NOTE — Therapy (Signed)
[place] [address] [place], [place], [address] Phone: [phone]   Fax:  [phone]  Physical Therapy Evaluation  Patient Details  Name: Debbie Hayes MRN: [id number] Date of Birth: 09-08-1950 Referring Provider (PT): Lamar Blinks   Encounter Date: 05/10/2018  PT End of Session - 05/10/18 1456    Visit Number  1    Date for PT Re-Evaluation  07/10/18    PT Start Time  1430    PT Stop Time  1525    PT Time Calculation (min)  55 min    Activity Tolerance  Patient limited by pain    Behavior During Therapy  [facility] for tasks assessed/performed       Past Medical History:  Diagnosis Date  . Allergy   . Cancer [facility])    thyroid cancer  . Cataract   . Chronic migraine BOTOX INJECTION EVERY 3 MONTHS  . Chronic pain in right shoulder   . Constipation   . Diabetes mellitus   . Disorder of inner ear CAUSES VERTIGO OCCASIONALLY  . Fibromyalgia   . GERD (gastroesophageal reflux disease)   . Hemorrhoid   . History of bladder infections   . History of thyroid cancer 2009  S/P TOTAL THYROIDECTOMY AND RADIATION  . Hyperlipidemia   . Hypertension CARDIOLOGIST- DR Wynonia Lawman- WILL REQUEST LATE NOTE   DENIES S & S  . Hypothyroidism   . Insomnia   . Left shoulder pain   . Macular degeneration   . Normal nuclear stress test 01-25-2009  . OA (osteoarthritis) JOINTS  . Osteopenia   . PONV (postoperative nausea and vomiting) SEVERE  . Rotator cuff disorder LEFT SHOULDER RTC IMPINGMENT  . Sleep apnea    mouth guard, no cpap   . Sleep apnea in adult 06/26/2017  . Spondylolisthesis of lumbar region   . 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 Wears glasses     Past Surgical History:  Procedure Laterality Date  . BACK SURGERY     Dr Arnoldo Morale- spondylosis  . BILATERAL CARPAL TUNNEL RELEASE  1994  . BILATERAL ELBOW SURG.  1999  . BILATERAL SALPINGOOPHECTOMY  1993   POST-OP URETER REPAIR 12 DAYS AFTER   .  COLONOSCOPY    . FOOT ARTHRODESIS Left 07/10/2016   Procedure: LEFT 2ND TARSAL METATARSAL ARTHRODESIS  GASTROC RECESSION LEFT LAPIDUS MODIFIED MCBRIDE BUIONECTOMY;  Surgeon: Wylene Simmer, MD;  Location: [place];  Service: Orthopedics;  Laterality: Left;  Marland Kitchen GASTROC RECESSION EXTREMITY Left 07/10/2016   Procedure: GASTROC RECESSION EXTREMITY;  Surgeon: Wylene Simmer, MD;  Location: [place];  Service: Orthopedics;  Laterality: Left;  . LEFT THUMB JOINT REPLACEMENT  2005   RIGHT DONE IN 2004  . OOPHORECTOMY    . POLYPECTOMY    . RIGHT KNEE ARTHROSCOPY  X2  BEFORE 2011  . RIGHT KNEE ARTHROSCOPY/ PARTIAL LATERAL MENISECTOMY/ TRICOMPARTMENT CHONDROPLASTY/ DECOMPRESSION CYST  10-26-2009  . RIGHT KNEE CLOSED MANIPULATION  09-11-2010  . RIGHT SHOULDER ARTHROSCOPY  2010  &  2004  . TIBIA CYST REMOVED AND ORIF LEG FX  1958  . TONSILLECTOMY  1968  . TOTAL KNEE ARTHROPLASTY  07-16-2010   RIGHT  . TOTAL THYROIDECTOMY  2009   CANCER  (POST-OP BLEED)  AND RADIATION TX  . trigger fingers Right 2013   3rd and 4th fingers  . UPPER GASTROINTESTINAL ENDOSCOPY    . VAGINAL HYSTERECTOMY  1990    There were no vitals  filed for this visit.   Subjective Assessment - 05/10/18 1434    Subjective  Patient has a diagnosis of fibromyalgia, she reports that she usually has a flare that lasts 5 days, this time it has lasted >5 weeks.  Pain is mostly in the T/L area.  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  She has a past lumbar surgery years ago.    Limitations  Walking;House hold activities;Standing;Lifting    Patient Stated Goals  have less pain    Currently in Pain?  Yes    Pain Score  3     Pain Location  Back    Pain Orientation  Mid    Pain Descriptors / Indicators  Aching;Sharp    Pain Type  Acute pain    Pain Radiating Towards  denies    Pain Onset  More than a month ago    Pain Frequency  Constant     Aggravating Factors   getting up from sitting pain up to 9/10    Pain Relieving Factors  prednisone seemed to help the pain at best a 3-4/10    Effect of Pain on Daily Activities  difficulty with activity         [facility] PT Assessment - 05/10/18 0001      Assessment   Medical Diagnosis  mid back pain    Referring Provider (PT)  Janett Billow Copland    Onset Date/Surgical Date  04/10/18    Prior Therapy  no      Precautions   Precaution Comments  migraines      Balance Screen   Has the patient fallen in the past 6 months  No    Has the patient had a decrease in activity level because of a fear of falling?   No    Is the patient reluctant to leave their home because of a fear of falling?   No      Home Film/video editor residence    Additional Comments  has stairs at home, does travel a lot, does housework      Prior Function   Level of [employer]  Retired    Leisure  no exercise      Posture/Postural Control   Posture Comments  fwd head      AROM   Overall AROM Comments  Lumbar ROM decreased 25% wtih pain      Strength   Overall Strength Comments  4-/5 with some increased pain      Flexibility   Soft Tissue Assessment /Muscle Length  yes    Hamstrings  tightness    Piriformis  tightness      Palpation   Palpation comment  very tender in the T9-L1 area, very tight paraspinals, pain with breathing and worse pain with resisted deep breathing, possible costovertebral issue                Objective measurements completed on examination: See above findings.      [facility] Adult PT Treatment/Exercise - 05/10/18 0001      Modalities   Modalities  Electrical Stimulation;Moist Heat      Moist Heat Therapy   Number Minutes Moist Heat  15 Minutes    Moist Heat Location  Lumbar Spine      Electrical Stimulation   Electrical Stimulation Location  T/L area  Electrical Stimulation Action  IFC    Electrical  Stimulation Parameters  sitting    Electrical Stimulation Goals  Pain             PT Education - 05/10/18 1456    Education Details  instructed in gentle deep breathing and gentle resisted deep breathing    Person(s) Educated  Patient    Methods  Explanation;Demonstration    Comprehension  Verbalized understanding       PT Short Term Goals - 05/10/18 1501      PT SHORT TERM GOAL #1   Title  independent with initial HEP    Time  1    Period  Weeks    Status  New        PT Long Term Goals - 05/10/18 1501      PT LONG TERM GOAL #1   Title  independent with proper posture and body mechanics    Time  8    Period  Weeks    Status  New      PT LONG TERM GOAL #2   Title  increase lumbar ROM 25%    Time  8    Period  Weeks    Status  New      PT LONG TERM GOAL #3   Title  report able to get up and down from sitting without pain    Time  8    Period  Weeks    Status  New      PT LONG TERM GOAL #4   Title  decrease pain 25%    Time  8    Period  Weeks    Status  New             Plan - 05/10/18 1457    Clinical Impression Statement  Patient reports pain in the thoracic area for about 5-6 weeks.  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  She reports some pain with coughing and sneezing.  Denies any radicular sx's.  She is very tender with spasms in the parapsinals, she has pain with deep breathing and more pain with some resisted deep breathing.    History and Personal Factors relevant to plan of care:  Migraines, fibromyalgia and past lumbar surgery    Clinical Presentation  Evolving    Clinical Decision Making  Low    Rehab Potential  Good    PT Frequency  2x / week    PT Duration  8 weeks    PT Treatment/Interventions  ADLs/Self Care Home Management;Electrical Stimulation;Cryotherapy;Moist Heat;Therapeutic exercise;Functional mobility training;Gait training;Ultrasound;Balance  training;Patient/family education;Manual techniques;Dry needling    PT Next Visit Plan  slowly start some gentle pelvic and lumbar stabilization    Consulted and Agree with Plan of Care  Patient       Patient will benefit from skilled therapeutic intervention in order to improve the following deficits and impairments:  Decreased coordination, Decreased range of motion, Difficulty walking, Increased muscle spasms, Decreased activity tolerance, Pain, Improper body mechanics, Impaired flexibility, Decreased strength, Postural dysfunction  Visit Diagnosis: Pain in thoracic spine - Plan: PT plan of care cert/re-cert  Muscle spasm of back - Plan: PT plan of care cert/re-cert     Problem List Patient Active Problem List   Diagnosis Date Noted  . Chronic migraine with aura 12/15/2017  . Diabetes mellitus due to underlying condition with  complication ([place]) 16/04/9603  . 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 Arthralgia 12/15/2017  . OSA (obstructive sleep apnea) 06/26/2017  . Snoring 03/25/2016  . Palpitations 03/25/2016  . Sleep related headaches 03/25/2016  . Trigeminal anesthesia 11/26/2015  . Spondylolisthesis of lumbar region 08/09/2014  . Back pain 02/17/2014  . Macular degeneration 02/17/2014  . Other and unspecified hyperlipidemia 10/14/2013  . Onychomycosis 12/01/2012  . Arthritis 04/12/2012  . Hypothyroid 04/12/2012    Sumner Boast., PT 05/10/2018, 3:04 PM  [place] [address] [place], [place], [address] Phone: [phone]   Fax:  [phone]  Name: Debbie Hayes MRN: [id number] Date of Birth: [birth year]

## 2018-05-12 ENCOUNTER — Telehealth: Payer: Self-pay | Admitting: Neurology

## 2018-05-12 ENCOUNTER — Other Ambulatory Visit: Payer: Self-pay

## 2018-05-12 ENCOUNTER — Encounter (HOSPITAL_BASED_OUTPATIENT_CLINIC_OR_DEPARTMENT_OTHER): Payer: Self-pay | Admitting: *Deleted

## 2018-05-12 NOTE — Telephone Encounter (Signed)
PA submitted through cover my meds/medimpact for [employer]. Can take up to 5 days before hearing a response. KEY: [id number]

## 2018-05-12 NOTE — Telephone Encounter (Signed)
PA approved through medimpact from 05/12/18-05/12/2019 Ref # [id number]

## 2018-05-13 MED FILL — EMGALITY 120 MG/ML SOAJ: 120 | 30 days supply | Qty: 1 | Fill #5

## 2018-05-17 ENCOUNTER — Encounter: Payer: Self-pay | Admitting: Physical Therapy

## 2018-05-17 ENCOUNTER — Ambulatory Visit: Payer: 59 | Admitting: Physical Therapy

## 2018-05-17 DIAGNOSIS — M546 Pain in thoracic spine: Secondary | ICD-10-CM

## 2018-05-17 DIAGNOSIS — M62830 Muscle spasm of back: Secondary | ICD-10-CM

## 2018-05-17 NOTE — Therapy (Signed)
[place] [address] [place], [place], [address] Phone: [phone]   Fax:  [phone]  Physical Therapy Treatment  Patient Details  Name: Debbie Hayes MRN: [id number] Date of Birth: 19-Sep-1950 Referring Provider (PT): Lamar Blinks   Encounter Date: 05/17/2018  PT End of Session - 05/17/18 1223    Visit Number  2    PT Start Time  1146    PT Stop Time  1234    PT Time Calculation (min)  48 min    Activity Tolerance  Patient limited by pain    Behavior During Therapy  [facility] for tasks assessed/performed       Past Medical History:  Diagnosis Date  . Allergy   . Cancer [facility])    thyroid cancer  . Cataract   . Chronic migraine BOTOX INJECTION EVERY 3 MONTHS  . Chronic pain in right shoulder   . Constipation   . Diabetes mellitus   . Disorder of inner ear CAUSES VERTIGO OCCASIONALLY  . Fibromyalgia   . GERD (gastroesophageal reflux disease)   . Hemorrhoid   . History of bladder infections   . History of thyroid cancer 2009  S/P TOTAL THYROIDECTOMY AND RADIATION  . Hyperlipidemia   . Hypertension CARDIOLOGIST- DR Wynonia Lawman- WILL REQUEST LATE NOTE   DENIES S & S  . Hypothyroidism   . Insomnia   . Left shoulder pain   . Macular degeneration   . Normal nuclear stress test 01-25-2009  . OA (osteoarthritis) JOINTS  . Osteopenia   . Painful orthopaedic hardware ([place])    left foot  . PONV (postoperative nausea and vomiting) SEVERE  . Rotator cuff disorder LEFT SHOULDER RTC IMPINGMENT  . Sleep apnea    mouth guard, no cpap   . Sleep apnea in adult 06/26/2017  . Spondylolisthesis of lumbar region   . 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 Wears glasses     Past Surgical History:  Procedure Laterality Date  . BACK SURGERY     Dr Arnoldo Morale- spondylosis  . BILATERAL CARPAL TUNNEL RELEASE  1994  . BILATERAL ELBOW SURG.  1999  . BILATERAL SALPINGOOPHECTOMY  1993   POST-OP URETER REPAIR 12 DAYS  AFTER   . COLONOSCOPY    . FOOT ARTHRODESIS Left 07/10/2016   Procedure: LEFT 2ND TARSAL METATARSAL ARTHRODESIS  GASTROC RECESSION LEFT LAPIDUS MODIFIED MCBRIDE BUIONECTOMY;  Surgeon: Wylene Simmer, MD;  Location: [place];  Service: Orthopedics;  Laterality: Left;  Marland Kitchen GASTROC RECESSION EXTREMITY Left 07/10/2016   Procedure: GASTROC RECESSION EXTREMITY;  Surgeon: Wylene Simmer, MD;  Location: [place];  Service: Orthopedics;  Laterality: Left;  . LEFT THUMB JOINT REPLACEMENT  2005   RIGHT DONE IN 2004  . OOPHORECTOMY    . POLYPECTOMY    . RIGHT KNEE ARTHROSCOPY  X2  BEFORE 2011  . RIGHT KNEE ARTHROSCOPY/ PARTIAL LATERAL MENISECTOMY/ TRICOMPARTMENT CHONDROPLASTY/ DECOMPRESSION CYST  10-26-2009  . RIGHT KNEE CLOSED MANIPULATION  09-11-2010  . RIGHT SHOULDER ARTHROSCOPY  2010  &  2004  . TIBIA CYST REMOVED AND ORIF LEG FX  1958  . TONSILLECTOMY  1968  . TOTAL KNEE ARTHROPLASTY  07-16-2010   RIGHT  . TOTAL THYROIDECTOMY  2009   CANCER  (POST-OP BLEED)  AND RADIATION [place]  . trigger fingers Right 2013   3rd and 4th fingers  . UPPER GASTROINTESTINAL ENDOSCOPY    . VAGINAL HYSTERECTOMY  1990    There were  no vitals filed for this visit.  Subjective Assessment - 05/17/18 1146    Subjective  Pt reports no change since evaluation.    Currently in Pain?  Yes    Pain Score  4     Pain Location  Back                       [facility] Adult PT Treatment/Exercise - 05/17/18 0001      Exercises   Exercises  Lumbar      Lumbar Exercises: Stretches   Passive Hamstring Stretch  Left;Right;4 reps;10 seconds    Single Knee to Chest Stretch  Right;Left;4 reps;20 seconds    Piriformis Stretch  Left;Right;3 reps;10 seconds      Lumbar Exercises: Aerobic   UBE (Upper Arm Bike)  L1 2 min each wayfg      Lumbar Exercises: Seated   Other Seated Lumbar Exercises  Rows and Extensions yellow x10      Modalities   Modalities  Electrical Stimulation;Moist Heat       Moist Heat Therapy   Number Minutes Moist Heat  15 Minutes    Moist Heat Location  Lumbar Spine      Electrical Stimulation   Electrical Stimulation Location  T/L area    Electrical Stimulation Action  IFC    Electrical Stimulation Parameters  sitting    Electrical Stimulation Goals  Pain               PT Short Term Goals - 05/10/18 1501      PT SHORT TERM GOAL #1   Title  independent with initial HEP    Time  1    Period  Weeks    Status  New        PT Long Term Goals - 05/10/18 1501      PT LONG TERM GOAL #1   Title  independent with proper posture and body mechanics    Time  8    Period  Weeks    Status  New      PT LONG TERM GOAL #2   Title  increase lumbar ROM 25%    Time  8    Period  Weeks    Status  New      PT LONG TERM GOAL #3   Title  report able to get up and down from sitting without pain    Time  8    Period  Weeks    Status  New      PT LONG TERM GOAL #4   Title  decrease pain 25%    Time  8    Period  Weeks    Status  New            Plan - 05/17/18 1225    Clinical Impression Statement  Pt reports that she is in constant pain most of the time and tries to avoid anything that makes it worst. Pt was will to do a little activities but not much because she reports repetitive motions causes her fibro to flare up. Postural cues needed with seated rows and extensions. Pt only tolerated gentle stretched not allowing any feel of restriction.     PT Frequency  2x / week    PT Duration  8 weeks    PT Treatment/Interventions  ADLs/Self Care Home Management;Electrical Stimulation;Cryotherapy;Moist Heat;Therapeutic exercise;Functional mobility training;Gait training;Ultrasound;Balance training;Patient/family education;Manual techniques;Dry needling    PT Next Visit Plan  slowly start some  gentle pelvic and lumbar stabilization       Patient will benefit from skilled therapeutic intervention in order to improve the following deficits and  impairments:  Decreased coordination, Decreased range of motion, Difficulty walking, Increased muscle spasms, Decreased activity tolerance, Pain, Improper body mechanics, Impaired flexibility, Decreased strength, Postural dysfunction  Visit Diagnosis: Pain in thoracic spine  Muscle spasm of back     Problem List Patient Active Problem List   Diagnosis Date Noted  . Chronic migraine with aura 12/15/2017  . Diabetes mellitus due to underlying condition with complication ([place]) 96/10/5407  . 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 Arthralgia 12/15/2017  . OSA (obstructive sleep apnea) 06/26/2017  . Snoring 03/25/2016  . Palpitations 03/25/2016  . Sleep related headaches 03/25/2016  . Trigeminal anesthesia 11/26/2015  . Spondylolisthesis of lumbar region 08/09/2014  . Back pain 02/17/2014  . Macular degeneration 02/17/2014  . Other and unspecified hyperlipidemia 10/14/2013  . Onychomycosis 12/01/2012  . Arthritis 04/12/2012  . Hypothyroid 04/12/2012    Scot Jun, PTA 05/17/2018, 12:31 PM  [place] [address] [place], [place], [address] Phone: [phone]   Fax:  [phone]  Name: Debbie Hayes MRN: [id number] Date of Birth: [birth year]

## 2018-05-18 MED FILL — LOSARTAN POTASSIUM 50 MG TA: 50 | 30 days supply | Qty: 30 | Fill #2

## 2018-05-19 ENCOUNTER — Ambulatory Visit: Payer: 59 | Admitting: Physical Therapy

## 2018-05-19 ENCOUNTER — Encounter: Payer: Self-pay | Admitting: Physical Therapy

## 2018-05-19 DIAGNOSIS — M62830 Muscle spasm of back: Secondary | ICD-10-CM | POA: Diagnosis not present

## 2018-05-19 DIAGNOSIS — M546 Pain in thoracic spine: Secondary | ICD-10-CM | POA: Diagnosis not present

## 2018-05-19 NOTE — Therapy (Signed)
[place] [address] [address] [place], [place], [address] Phone: [phone]   Fax:  [phone]  Physical Therapy Treatment  Patient Details  Name: Debbie Hayes MRN: [id number] Date of Birth: July 25, 1951 Referring Provider (PT): Lamar Blinks   Encounter Date: 05/19/2018  PT End of Session - 05/19/18 1500    Visit Number  3    Date for PT Re-Evaluation  07/10/18    PT Start Time  1430    PT Stop Time  1509    PT Time Calculation (min)  39 min    Activity Tolerance  Patient limited by pain    Behavior During Therapy  [facility] for tasks assessed/performed       Past Medical History:  Diagnosis Date  . Allergy   . Cancer [facility])    thyroid cancer  . Cataract   . Chronic migraine BOTOX INJECTION EVERY 3 MONTHS  . Chronic pain in right shoulder   . Constipation   . Diabetes mellitus   . Disorder of inner ear CAUSES VERTIGO OCCASIONALLY  . Fibromyalgia   . GERD (gastroesophageal reflux disease)   . Hemorrhoid   . History of bladder infections   . History of thyroid cancer 2009  S/P TOTAL THYROIDECTOMY AND RADIATION  . Hyperlipidemia   . Hypertension CARDIOLOGIST- DR Wynonia Lawman- WILL REQUEST LATE NOTE   DENIES S & S  . Hypothyroidism   . Insomnia   . Left shoulder pain   . Macular degeneration   . Normal nuclear stress test 01-25-2009  . OA (osteoarthritis) JOINTS  . Osteopenia   . Painful orthopaedic hardware ([place])    left foot  . PONV (postoperative nausea and vomiting) SEVERE  . Rotator cuff disorder LEFT SHOULDER RTC IMPINGMENT  . Sleep apnea    mouth guard, no cpap   . Sleep apnea in adult 06/26/2017  . Spondylolisthesis of lumbar region   . 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 Wears glasses     Past Surgical History:  Procedure Laterality Date  . BACK SURGERY     Dr Arnoldo Morale- spondylosis  . BILATERAL CARPAL TUNNEL RELEASE  1994  . BILATERAL ELBOW SURG.  1999  . BILATERAL SALPINGOOPHECTOMY   1993   POST-OP URETER REPAIR 12 DAYS AFTER   . COLONOSCOPY    . FOOT ARTHRODESIS Left 07/10/2016   Procedure: LEFT 2ND TARSAL METATARSAL ARTHRODESIS  GASTROC RECESSION LEFT LAPIDUS MODIFIED MCBRIDE BUIONECTOMY;  Surgeon: Wylene Simmer, MD;  Location: [place];  Service: Orthopedics;  Laterality: Left;  Marland Kitchen GASTROC RECESSION EXTREMITY Left 07/10/2016   Procedure: GASTROC RECESSION EXTREMITY;  Surgeon: Wylene Simmer, MD;  Location: [place];  Service: Orthopedics;  Laterality: Left;  . LEFT THUMB JOINT REPLACEMENT  2005   RIGHT DONE IN 2004  . OOPHORECTOMY    . POLYPECTOMY    . RIGHT KNEE ARTHROSCOPY  X2  BEFORE 2011  . RIGHT KNEE ARTHROSCOPY/ PARTIAL LATERAL MENISECTOMY/ TRICOMPARTMENT CHONDROPLASTY/ DECOMPRESSION CYST  10-26-2009  . RIGHT KNEE CLOSED MANIPULATION  09-11-2010  . RIGHT SHOULDER ARTHROSCOPY  2010  &  2004  . TIBIA CYST REMOVED AND ORIF LEG FX  1958  . TONSILLECTOMY  1968  . TOTAL KNEE ARTHROPLASTY  07-16-2010   RIGHT  . TOTAL THYROIDECTOMY  2009   CANCER  (POST-OP BLEED)  AND RADIATION [place]  . trigger fingers Right 2013   3rd and 4th fingers  . UPPER GASTROINTESTINAL ENDOSCOPY    .  VAGINAL HYSTERECTOMY  1990    There were no vitals filed for this visit.  Subjective Assessment - 05/19/18 1434    Subjective  Pt reports that's he has dome sharp pain after last session. Pt stated that repetitive motions caused her to flair up     Currently in Pain?  Yes    Pain Score  4    5-6/10 when moving around   Pain Location  Back    Pain Orientation  Right                       [facility] Adult PT Treatment/Exercise - 05/19/18 0001      Exercises   Exercises  Lumbar      Lumbar Exercises: Stretches   Passive Hamstring Stretch  Left;Right;4 reps;10 seconds    Single Knee to Chest Stretch  Right;Left;4 reps;20 seconds    Lower Trunk Rotation  3 reps    Piriformis Stretch  Left;Right;10 seconds;4 reps      Lumbar Exercises: Seated    Other Seated Lumbar Exercises  resisted breathing 2x5    Other Seated Lumbar Exercises  deepbreathing      Modalities   Modalities  Electrical Stimulation;Moist Heat      Moist Heat Therapy   Number Minutes Moist Heat  15 Minutes    Moist Heat Location  Lumbar Spine      Electrical Stimulation   Electrical Stimulation Location  T/L area    Electrical Stimulation Action  IFC    Electrical Stimulation Parameters  sitting    Electrical Stimulation Goals  Pain               PT Short Term Goals - 05/10/18 1501      PT SHORT TERM GOAL #1   Title  independent with initial HEP    Time  1    Period  Weeks    Status  New        PT Long Term Goals - 05/10/18 1501      PT LONG TERM GOAL #1   Title  independent with proper posture and body mechanics    Time  8    Period  Weeks    Status  New      PT LONG TERM GOAL #2   Title  increase lumbar ROM 25%    Time  8    Period  Weeks    Status  New      PT LONG TERM GOAL #3   Title  report able to get up and down from sitting without pain    Time  8    Period  Weeks    Status  New      PT LONG TERM GOAL #4   Title  decrease pain 25%    Time  8    Period  Weeks    Status  New            Plan - 05/19/18 1501    Clinical Impression Statement  No exercises completed, pt reports that's any kind of repetitive movements causes her fibro to flair up. No pain reports with LE stretching. Some pain with lower body trunk rotations on the R side. Pt reports that this may be her last day but was not sure.    Rehab Potential  Good    PT Frequency  2x / week    PT Duration  8 weeks    PT Treatment/Interventions  ADLs/Self  Care Home Management;Electrical Stimulation;Cryotherapy;Moist Heat;Therapeutic exercise;Functional mobility training;Gait training;Ultrasound;Balance training;Patient/family education;Manual techniques;Dry needling    PT Next Visit Plan  slowly start some gentle pelvic and lumbar stabilization        Patient will benefit from skilled therapeutic intervention in order to improve the following deficits and impairments:  Decreased coordination, Decreased range of motion, Difficulty walking, Increased muscle spasms, Decreased activity tolerance, Pain, Improper body mechanics, Impaired flexibility, Decreased strength, Postural dysfunction  Visit Diagnosis: Pain in thoracic spine  Muscle spasm of back     Problem List Patient Active Problem List   Diagnosis Date Noted  . Chronic migraine with aura 12/15/2017  . Diabetes mellitus due to underlying condition with complication ([place]) 01/18/7627  . 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 Arthralgia 12/15/2017  . OSA (obstructive sleep apnea) 06/26/2017  . Snoring 03/25/2016  . Palpitations 03/25/2016  . Sleep related headaches 03/25/2016  . Trigeminal anesthesia 11/26/2015  . Spondylolisthesis of lumbar region 08/09/2014  . Back pain 02/17/2014  . Macular degeneration 02/17/2014  . Other and unspecified hyperlipidemia 10/14/2013  . Onychomycosis 12/01/2012  . Arthritis 04/12/2012  . Hypothyroid 04/12/2012    Scot Jun, PTA 05/19/2018, 3:04 PM  [place] [address] [place], [place], [address] Phone: [phone]   Fax:  [phone]  Name: PENELOPE FITTRO MRN: [id number] Date of Birth: 03-31-1951

## 2018-05-20 ENCOUNTER — Encounter (HOSPITAL_BASED_OUTPATIENT_CLINIC_OR_DEPARTMENT_OTHER): Payer: Self-pay | Admitting: Certified Registered"

## 2018-05-20 ENCOUNTER — Other Ambulatory Visit: Payer: Self-pay

## 2018-05-20 ENCOUNTER — Ambulatory Visit (HOSPITAL_BASED_OUTPATIENT_CLINIC_OR_DEPARTMENT_OTHER): Payer: 59 | Admitting: Certified Registered"

## 2018-05-20 ENCOUNTER — Encounter (HOSPITAL_BASED_OUTPATIENT_CLINIC_OR_DEPARTMENT_OTHER)
Admission: RE | Disposition: A | Discharge status: Home / Self Care | Payer: Self-pay | Source: Ambulatory Visit | Attending: Orthopedic Surgery

## 2018-05-20 ENCOUNTER — Ambulatory Visit (HOSPITAL_BASED_OUTPATIENT_CLINIC_OR_DEPARTMENT_OTHER)
Admission: RE | Admit: 2018-05-20 | Discharge: 2018-05-20 | Disposition: A | Discharge status: Home / Self Care | Payer: 59 | Source: Ambulatory Visit | Attending: Orthopedic Surgery | Admitting: Orthopedic Surgery

## 2018-05-20 DIAGNOSIS — Y831 Surgical operation with implant of artificial internal device as the cause of abnormal reaction of the patient, or of later complication, without mention of misadventure at the time of the procedure: Secondary | ICD-10-CM | POA: Insufficient documentation

## 2018-05-20 DIAGNOSIS — M25511 Pain in right shoulder: Secondary | ICD-10-CM | POA: Insufficient documentation

## 2018-05-20 DIAGNOSIS — K219 Gastro-esophageal reflux disease without esophagitis: Secondary | ICD-10-CM | POA: Insufficient documentation

## 2018-05-20 DIAGNOSIS — Z882 Allergy status to sulfonamides status: Secondary | ICD-10-CM | POA: Insufficient documentation

## 2018-05-20 DIAGNOSIS — G8929 Other chronic pain: Secondary | ICD-10-CM | POA: Insufficient documentation

## 2018-05-20 DIAGNOSIS — M79672 Pain in left foot: Secondary | ICD-10-CM | POA: Insufficient documentation

## 2018-05-20 DIAGNOSIS — E119 Type 2 diabetes mellitus without complications: Secondary | ICD-10-CM | POA: Diagnosis not present

## 2018-05-20 DIAGNOSIS — E785 Hyperlipidemia, unspecified: Secondary | ICD-10-CM | POA: Diagnosis not present

## 2018-05-20 DIAGNOSIS — M1990 Unspecified osteoarthritis, unspecified site: Secondary | ICD-10-CM | POA: Insufficient documentation

## 2018-05-20 DIAGNOSIS — E039 Hypothyroidism, unspecified: Secondary | ICD-10-CM | POA: Diagnosis not present

## 2018-05-20 DIAGNOSIS — G4730 Sleep apnea, unspecified: Secondary | ICD-10-CM | POA: Diagnosis not present

## 2018-05-20 DIAGNOSIS — F419 Anxiety disorder, unspecified: Secondary | ICD-10-CM | POA: Diagnosis not present

## 2018-05-20 DIAGNOSIS — G4700 Insomnia, unspecified: Secondary | ICD-10-CM | POA: Diagnosis not present

## 2018-05-20 DIAGNOSIS — M797 Fibromyalgia: Secondary | ICD-10-CM | POA: Diagnosis not present

## 2018-05-20 DIAGNOSIS — T8484XA Pain due to internal orthopedic prosthetic devices, implants and grafts, initial encounter: Secondary | ICD-10-CM | POA: Insufficient documentation

## 2018-05-20 DIAGNOSIS — I10 Essential (primary) hypertension: Secondary | ICD-10-CM | POA: Insufficient documentation

## 2018-05-20 DIAGNOSIS — M8580 Other specified disorders of bone density and structure, unspecified site: Secondary | ICD-10-CM | POA: Diagnosis not present

## 2018-05-20 DIAGNOSIS — T85848D Pain due to other internal prosthetic devices, implants and grafts, subsequent encounter: Secondary | ICD-10-CM

## 2018-05-20 DIAGNOSIS — Z7984 Long term (current) use of oral hypoglycemic drugs: Secondary | ICD-10-CM | POA: Diagnosis not present

## 2018-05-20 HISTORY — DX: Pain due to internal orthopedic prosthetic devices, implants and grafts, initial encounter: T84.84XA

## 2018-05-20 HISTORY — DX: Methicillin resistant Staphylococcus aureus infection, unspecified site: A49.02

## 2018-05-20 HISTORY — PX: HARDWARE REMOVAL: SHX979

## 2018-05-20 LAB — POCT I-STAT, CHEM 8
BUN: 15 mg/dL (ref 8–23)
CHLORIDE: 103 mmol/L (ref 98–111)
Calcium, Ion: 1.13 mmol/L — ABNORMAL LOW (ref 1.15–1.40)
Creatinine, Ser: 0.6 mg/dL (ref 0.44–1.00)
Glucose, Bld: 135 mg/dL — ABNORMAL HIGH (ref 70–99)
HEMATOCRIT: 39 % (ref 36.0–46.0)
HEMOGLOBIN: 13.3 g/dL (ref 12.0–15.0)
POTASSIUM: 3.9 mmol/L (ref 3.5–5.1)
Sodium: 139 mmol/L (ref 135–145)
TCO2: 25 mmol/L (ref 22–32)

## 2018-05-20 LAB — GLUCOSE, CAPILLARY: Glucose-Capillary: 132 mg/dL — ABNORMAL HIGH (ref 70–99)

## 2018-05-20 SURGERY — REMOVAL, HARDWARE
Anesthesia: General | Site: Foot | Laterality: Left

## 2018-05-20 MED ORDER — CEFAZOLIN SODIUM-DEXTROSE 2-4 GM/100ML-% IV SOLN
INTRAVENOUS | Status: AC
  Filled 2018-05-20: qty 100

## 2018-05-20 MED ORDER — ONDANSETRON HCL 4 MG/2ML IJ SOLN: 4.0000 mg | Freq: Once | INTRAMUSCULAR | Status: DC | PRN

## 2018-05-20 MED ORDER — MIDAZOLAM HCL 2 MG/2ML IJ SOLN
INTRAMUSCULAR | Status: AC
  Filled 2018-05-20: qty 2

## 2018-05-20 MED ORDER — OXYCODONE HCL 5 MG PO TABS
ORAL_TABLET | ORAL | Status: AC
  Filled 2018-05-20: qty 1

## 2018-05-20 MED ORDER — SODIUM CHLORIDE 0.9 % IV SOLN: INTRAVENOUS | Status: DC

## 2018-05-20 MED ORDER — MIDAZOLAM HCL 2 MG/2ML IJ SOLN
1.0000 mg | INTRAMUSCULAR | Status: DC | PRN
  Administered 2018-05-20: 2 mg via INTRAVENOUS

## 2018-05-20 MED ORDER — FENTANYL CITRATE (PF) 100 MCG/2ML IJ SOLN
INTRAMUSCULAR | Status: AC
  Filled 2018-05-20: qty 2

## 2018-05-20 MED ORDER — FENTANYL CITRATE (PF) 100 MCG/2ML IJ SOLN
25.0000 ug | INTRAMUSCULAR | Status: DC | PRN
  Administered 2018-05-20: 25 ug via INTRAVENOUS

## 2018-05-20 MED ORDER — FENTANYL CITRATE (PF) 100 MCG/2ML IJ SOLN
50.0000 ug | INTRAMUSCULAR | Status: DC | PRN
  Administered 2018-05-20 (×2): 50 ug via INTRAVENOUS

## 2018-05-20 MED ORDER — OXYCODONE HCL 5 MG PO TABS: 5.0000 mg | ORAL_TABLET | ORAL | 0 refills | Status: AC | PRN

## 2018-05-20 MED ORDER — PROPOFOL 10 MG/ML IV BOLUS
INTRAVENOUS | Status: DC | PRN
  Administered 2018-05-20: 150 mg via INTRAVENOUS

## 2018-05-20 MED ORDER — ONDANSETRON HCL 4 MG/2ML IJ SOLN
INTRAMUSCULAR | Status: AC
  Filled 2018-05-20: qty 2

## 2018-05-20 MED ORDER — LIDOCAINE 2% (20 MG/ML) 5 ML SYRINGE
INTRAMUSCULAR | Status: AC
  Filled 2018-05-20: qty 5

## 2018-05-20 MED ORDER — ACETAMINOPHEN 325 MG PO TABS: 325.0000 mg | ORAL_TABLET | ORAL | Status: DC | PRN

## 2018-05-20 MED ORDER — LACTATED RINGERS IV SOLN
INTRAVENOUS | Status: DC
  Administered 2018-05-20: 10:00:00 via INTRAVENOUS

## 2018-05-20 MED ORDER — OXYCODONE HCL 5 MG PO TABS
5.0000 mg | ORAL_TABLET | Freq: Once | ORAL | Status: AC | PRN
  Administered 2018-05-20: 5 mg via ORAL

## 2018-05-20 MED ORDER — BUPIVACAINE-EPINEPHRINE 0.25% -1:200000 IJ SOLN
INTRAMUSCULAR | Status: DC | PRN
  Administered 2018-05-20: 10 mL

## 2018-05-20 MED ORDER — EPHEDRINE SULFATE 50 MG/ML IJ SOLN
INTRAMUSCULAR | Status: DC | PRN
  Administered 2018-05-20: 10 mg via INTRAVENOUS

## 2018-05-20 MED ORDER — ACETAMINOPHEN 160 MG/5ML PO SOLN: 325.0000 mg | ORAL | Status: DC | PRN

## 2018-05-20 MED ORDER — ONDANSETRON HCL 4 MG/2ML IJ SOLN
INTRAMUSCULAR | Status: DC | PRN
  Administered 2018-05-20: 4 mg via INTRAVENOUS

## 2018-05-20 MED ORDER — CHLORHEXIDINE GLUCONATE 4 % EX LIQD: 60.0000 mL | Freq: Once | CUTANEOUS | Status: DC

## 2018-05-20 MED ORDER — MEPERIDINE HCL 25 MG/ML IJ SOLN: 6.2500 mg | INTRAMUSCULAR | Status: DC | PRN

## 2018-05-20 MED ORDER — LIDOCAINE HCL (CARDIAC) PF 100 MG/5ML IV SOSY
PREFILLED_SYRINGE | INTRAVENOUS | Status: DC | PRN
  Administered 2018-05-20: 80 mg via INTRAVENOUS

## 2018-05-20 MED ORDER — CEFAZOLIN SODIUM-DEXTROSE 2-4 GM/100ML-% IV SOLN
2.0000 g | INTRAVENOUS | Status: AC
  Administered 2018-05-20: 2 g via INTRAVENOUS

## 2018-05-20 MED ORDER — OXYCODONE HCL 5 MG/5ML PO SOLN: 5.0000 mg | Freq: Once | ORAL | Status: AC | PRN

## 2018-05-20 MED ORDER — DOCUSATE SODIUM 100 MG PO CAPS: 100.0000 mg | ORAL_CAPSULE | Freq: Two times a day (BID) | ORAL | 0 refills | Status: DC

## 2018-05-20 MED ORDER — SENNA 8.6 MG PO TABS: 2.0000 | ORAL_TABLET | Freq: Two times a day (BID) | ORAL | 0 refills | Status: DC

## 2018-05-20 MED ORDER — SCOPOLAMINE 1 MG/3DAYS TD PT72: 1.0000 | MEDICATED_PATCH | Freq: Once | TRANSDERMAL | Status: DC | PRN

## 2018-05-20 MED ORDER — PROPOFOL 10 MG/ML IV BOLUS
INTRAVENOUS | Status: AC
  Filled 2018-05-20: qty 20

## 2018-05-20 MED FILL — oxyCODONE HCL 5 MG TABS: 5 | 5 days supply | Qty: 30 | Fill #0

## 2018-05-20 SURGICAL SUPPLY — 65 items
BANDAGE ACE 4X5 VEL STRL LF (GAUZE/BANDAGES/DRESSINGS) ×3 IMPLANT
BANDAGE ESMARK 6X9 LF (GAUZE/BANDAGES/DRESSINGS) IMPLANT
BENZOIN TINCTURE PRP APPL 2/3 (GAUZE/BANDAGES/DRESSINGS) IMPLANT
BLADE SURG 15 STRL LF DISP TIS (BLADE) ×2 IMPLANT
BLADE SURG 15 STRL SS (BLADE) ×4
BNDG COHESIVE 4X5 TAN STRL (GAUZE/BANDAGES/DRESSINGS) IMPLANT
BNDG COHESIVE 6X5 TAN STRL LF (GAUZE/BANDAGES/DRESSINGS) IMPLANT
BNDG ESMARK 4X9 LF (GAUZE/BANDAGES/DRESSINGS) ×3 IMPLANT
BNDG ESMARK 6X9 LF (GAUZE/BANDAGES/DRESSINGS)
CHLORAPREP W/TINT 26ML (MISCELLANEOUS) ×3 IMPLANT
CLOSURE WOUND 1/2 X4 (GAUZE/BANDAGES/DRESSINGS)
COVER BACK TABLE 60X90IN (DRAPES) ×3 IMPLANT
COVER WAND RF STERILE (DRAPES) IMPLANT
CUFF TOURNIQUET SINGLE 34IN LL (TOURNIQUET CUFF) IMPLANT
DECANTER SPIKE VIAL GLASS SM (MISCELLANEOUS) IMPLANT
DRAPE EXTREMITY T 121X128X90 (DRAPE) ×3 IMPLANT
DRAPE OEC MINIVIEW 54X84 (DRAPES) ×3 IMPLANT
DRAPE SURG 17X23 STRL (DRAPES) IMPLANT
DRAPE U-SHAPE 47X51 STRL (DRAPES) ×6 IMPLANT
DRSG MEPITEL 4X7.2 (GAUZE/BANDAGES/DRESSINGS) ×3 IMPLANT
DRSG PAD ABDOMINAL 8X10 ST (GAUZE/BANDAGES/DRESSINGS) ×3 IMPLANT
ELECT REM PT RETURN 9FT ADLT (ELECTROSURGICAL) ×3
ELECTRODE REM PT RTRN 9FT ADLT (ELECTROSURGICAL) ×1 IMPLANT
GAUZE SPONGE 4X4 12PLY STRL (GAUZE/BANDAGES/DRESSINGS) ×3 IMPLANT
GLOVE BIO SURGEON STRL SZ 6.5 (GLOVE) ×4 IMPLANT
GLOVE BIO SURGEON STRL SZ8 (GLOVE) ×3 IMPLANT
GLOVE BIO SURGEONS STRL SZ 6.5 (GLOVE) ×2
GLOVE BIOGEL PI IND STRL 7.0 (GLOVE) ×2 IMPLANT
GLOVE BIOGEL PI IND STRL 8 (GLOVE) ×3 IMPLANT
GLOVE BIOGEL PI INDICATOR 7.0 (GLOVE) ×4
GLOVE BIOGEL PI INDICATOR 8 (GLOVE) ×6
GLOVE ECLIPSE 8.0 STRL XLNG CF (GLOVE) ×3 IMPLANT
GOWN STRL REUS W/ TWL LRG LVL3 (GOWN DISPOSABLE) ×1 IMPLANT
GOWN STRL REUS W/ TWL XL LVL3 (GOWN DISPOSABLE) ×2 IMPLANT
GOWN STRL REUS W/TWL LRG LVL3 (GOWN DISPOSABLE) ×2
GOWN STRL REUS W/TWL XL LVL3 (GOWN DISPOSABLE) ×4
NEEDLE HYPO 22GX1.5 SAFETY (NEEDLE) ×3 IMPLANT
PACK BASIN DAY SURGERY FS (CUSTOM PROCEDURE TRAY) ×3 IMPLANT
PAD CAST 4YDX4 CTTN HI CHSV (CAST SUPPLIES) ×1 IMPLANT
PADDING CAST COTTON 4X4 STRL (CAST SUPPLIES) ×2
PADDING CAST COTTON 6X4 STRL (CAST SUPPLIES) IMPLANT
PENCIL BUTTON HOLSTER BLD 10FT (ELECTRODE) ×3 IMPLANT
SANITIZER HAND PURELL 535ML FO (MISCELLANEOUS) ×3 IMPLANT
SHEET MEDIUM DRAPE 40X70 STRL (DRAPES) ×3 IMPLANT
SLEEVE SCD COMPRESS KNEE MED (MISCELLANEOUS) ×3 IMPLANT
SPLINT FAST PLASTER 5X30 (CAST SUPPLIES)
SPLINT PLASTER CAST FAST 5X30 (CAST SUPPLIES) IMPLANT
SPONGE LAP 18X18 RF (DISPOSABLE) ×3 IMPLANT
STOCKINETTE 6  STRL (DRAPES) ×2
STOCKINETTE 6 STRL (DRAPES) ×1 IMPLANT
STRIP CLOSURE SKIN 1/2X4 (GAUZE/BANDAGES/DRESSINGS) IMPLANT
SUCTION FRAZIER HANDLE 10FR (MISCELLANEOUS) ×2
SUCTION TUBE FRAZIER 10FR DISP (MISCELLANEOUS) ×1 IMPLANT
SUT ETHILON 3 0 PS 1 (SUTURE) IMPLANT
SUT MNCRL AB 3-0 PS2 18 (SUTURE) ×3 IMPLANT
SUT VIC AB 0 SH 27 (SUTURE) IMPLANT
SUT VIC AB 2-0 SH 18 (SUTURE) IMPLANT
SUT VIC AB 2-0 SH 27 (SUTURE)
SUT VIC AB 2-0 SH 27XBRD (SUTURE) IMPLANT
SYR BULB 3OZ (MISCELLANEOUS) ×3 IMPLANT
SYR CONTROL 10ML LL (SYRINGE) ×3 IMPLANT
TOWEL GREEN STERILE FF (TOWEL DISPOSABLE) ×3 IMPLANT
TUBE CONNECTING 20'X1/4 (TUBING) ×1
TUBE CONNECTING 20X1/4 (TUBING) ×2 IMPLANT
UNDERPAD 30X30 (UNDERPADS AND DIAPERS) ×3 IMPLANT

## 2018-05-20 NOTE — Discharge Instructions (Addendum)
Wylene Simmer, MD [place]  Please read the following information regarding your care after surgery.  Medications  You only need a prescription for the narcotic pain medicine (ex. oxycodone, Percocet, Norco).  All of the other medicines listed below are available over the counter. X Naproxen that you have at home for the first 3 days after surgery. X acetominophen (Tylenol) 650 mg every 4-6 hours as you need for minor to moderate pain X oxycodone as prescribed for severe pain  Narcotic pain medicine (ex. oxycodone, Percocet, Vicodin) will cause constipation.  To prevent this problem, take the following medicines while you are taking any pain medicine. X docusate sodium (Colace) 100 mg twice a day X senna (Senokot) 2 tablets twice a day    Weight Bearing X Bear weight only on your operated foot in the post-op shoe.   Cast / Splint / Dressing X Remove your dressing 3 days after surgery and cover the incisions with dry dressings.    After your dressing, cast or splint is removed; you may shower, but do not soak or scrub the wound.  Allow the water to run over it, and then gently pat it dry.  Swelling It is normal for you to have swelling where you had surgery.  To reduce swelling and pain, keep your toes above your nose for at least 3 days after surgery.  It may be necessary to keep your foot or leg elevated for several weeks.  If it hurts, it should be elevated.  Follow Up Call my office at [phone] when you are discharged from the hospital or surgery center to schedule an appointment to be seen two weeks after surgery.  Call my office at [phone] if you develop a fever >101.5 F, nausea, vomiting, bleeding from the surgical site or severe pain.     Post Anesthesia Home Care Instructions  Activity: Get plenty of rest for the remainder of the day. A responsible individual must stay with you for 24 hours following the procedure.  For the next 24 hours, DO  NOT: -Drive a car -Paediatric nurse -Drink alcoholic beverages -Take any medication unless instructed by your physician -Make any legal decisions or sign important papers.  Meals: Start with liquid foods such as gelatin or soup. Progress to regular foods as tolerated. Avoid greasy, spicy, heavy foods. If nausea and/or vomiting occur, drink only clear liquids until the nausea and/or vomiting subsides. Call your physician if vomiting continues.  Special Instructions/Symptoms: Your throat may feel dry or sore from the anesthesia or the breathing tube placed in your throat during surgery. If this causes discomfort, gargle with warm salt water. The discomfort should disappear within 24 hours.  If you had a scopolamine patch placed behind your ear for the management of post- operative nausea and/or vomiting:  1. The medication in the patch is effective for 72 hours, after which it should be removed.  Wrap patch in a tissue and discard in the trash. Wash hands thoroughly with soap and water. 2. You may remove the patch earlier than 72 hours if you experience unpleasant side effects which may include dry mouth, dizziness or visual disturbances. 3. Avoid touching the patch. Wash your hands with soap and water after contact with the patch.

## 2018-05-20 NOTE — Op Note (Signed)
05/20/2018  11:50 AM  PATIENT:  Debbie Hayes  67 y.o. female  PRE-OPERATIVE DIAGNOSIS:  Left foot painful hardware at the medial cuneiform, first and second metatarsals  POST-OPERATIVE DIAGNOSIS: Same  Procedure(s): 1.  Removal of deep implants from the medial cuneiform 2.  Removal of deep implants from the first metatarsal (separate incision) 3.  Removal of deep implants from the second metatarsal (separate incision) 4.  AP and lateral radiographs of the left foot.  SURGEON:  Wylene Simmer, MD  ASSISTANT: Mechele Claude, PA-C  ANESTHESIA:   General  EBL:  minimal   TOURNIQUET: Approximately 20 minutes with an ankle Esmarch  COMPLICATIONS:  None apparent  DISPOSITION:  Extubated, awake and stable to recovery.  INDICATION FOR PROCEDURE: The patient is a 67 year old female who has a history of first and second TMT joint arthrodesis.  She has a solid fusion across the 2 joints and complains of pain at the hardware.  She has failed nonoperative treatment to date including activity modification, oral anti-inflammatories and shoewear modification.  She presents now for removal of deep implants from the first and second TMT joints as well as the intermetatarsal Lisfranc joint.  The risks and benefits of the alternative treatment options have been discussed in detail.  The patient wishes to proceed with surgery and specifically understands risks of bleeding, infection, nerve damage, blood clots, need for additional surgery, amputation and death.  PROCEDURE IN DETAIL:  After pre operative consent was obtained, and the correct operative site was identified, the patient was brought to the operating room and placed supine on the OR table.  Anesthesia was administered.  Pre-operative antibiotics were administered.  A surgical timeout was taken.  The left lower extremity was prepped and draped in standard sterile fashion.  The foot was exsanguinated and a 4 inch Esmarch tourniquet wrapped around  the ankle.  The medial cuneiform incision was identified.  It was opened again sharply and dissection was carried down through the subcutaneous tissues.  The tibialis anterior tendon was retracted exposing the medial cuneiform.  A curette was used to clear bone from around the screw head.  The screw was then removed in its entirety.  The wound was irrigated and closed with nylon..  Attention was turned to the first TMT joint.  A small incision was made distally over the base of the first metatarsal.  Dissection was carried down through the subcutaneous tissues.  Periosteum was incised and the screw head was identified.  A curette was used to remove surrounding bone.  The screw was removed in its entirety.  Attention was then turned to the proximal aspect of the first TMT joint.  A K wire was inserted percutaneously into the head of the cannulated screw.  A stab incision was made and the screw was cleared of all soft tissue.  The screw was then removed without difficulty.  Attention was turned to the second TMT joint.  A stab incision was made distally and dissection was carried down to the dorsal cortex of the metatarsal.  A curette was used to remove bone from around the screw.  The screw was then removed without difficulty.  A K wire was then inserted percutaneously into the proximal screw at the middle cuneiform.  A stab incision was made and the screw was cleared of all soft tissue.  The screw was then removed in its entirety.  Final AP and lateral radiographs showed complete removal of all hardware from the midfoot.  The wounds were  irrigated copiously and closed with nylon.  Quarter percent Marcaine with epinephrine was infiltrated into the subcutaneous tissues around the incisions.  Sterile dressings were applied followed by a compression wrap.  The tourniquet was released after application of the dressings.  The patient was awakened from anesthesia and transported to the recovery room in stable  condition.   FOLLOW UP PLAN: Weightbearing as tolerated in her cam boot.  Follow-up in 2 weeks for suture removal and regular shoewear.   RADIOGRAPHS: AP and lateral radiographs of the left foot are obtained intraoperatively.  These show interval removal of all hardware from the first and second tarsometatarsal joints as well as the intercuneiform Lisfranc joint.  There appears to be solid fusion across all 3 joints.    Mechele Claude PA-C was present and scrubbed for the duration of the operative case. His assistance was essential in positioning the patient, prepping and draping, gaining and maintaining exposure, performing the operation, closing and dressing the wounds and applying the splint.

## 2018-05-20 NOTE — Anesthesia Preprocedure Evaluation (Addendum)
Anesthesia Evaluation  Patient identified by MRN, date of birth, ID band Patient awake    Reviewed: Allergy & Precautions, NPO status , Patient's Chart, lab work & pertinent test results  History of Anesthesia Complications (+) PONV  Airway Mallampati: II  TM Distance: >3 FB Neck ROM: Full    Dental no notable dental hx. (+) Dental Advisory Given   Pulmonary neg pulmonary ROS,    Pulmonary exam normal        Cardiovascular hypertension, Pt. on medications Normal cardiovascular exam     Neuro/Psych  Headaches, Anxiety  Neuromuscular disease    GI/Hepatic Neg liver ROS, GERD  Medicated,  Endo/Other  diabetes, Type 2, Oral Hypoglycemic AgentsHypothyroidism   Renal/GU negative Renal ROS     Musculoskeletal  (+) Arthritis , Fibromyalgia -  Abdominal   Peds  Hematology   Anesthesia Other Findings   Reproductive/Obstetrics                             Anesthesia Physical  Anesthesia Plan  ASA: III  Anesthesia Plan: General   Post-op Pain Management:    Induction: Intravenous  PONV Risk Score and Plan: 3 and Ondansetron, Dexamethasone, Treatment may vary due to age or medical condition and Midazolam  Airway Management Planned: LMA  Additional Equipment:   Intra-op Plan:   Post-operative Plan: Extubation in OR  Informed Consent: I have reviewed the patients History and Physical, chart, labs and discussed the procedure including the risks, benefits and alternatives for the proposed anesthesia with the patient or authorized representative who has indicated his/her understanding and acceptance.   Dental advisory given  Plan Discussed with: CRNA, Anesthesiologist and Surgeon  Anesthesia Plan Comments:        Anesthesia Quick Evaluation

## 2018-05-20 NOTE — Anesthesia Postprocedure Evaluation (Signed)
Anesthesia Post Note  Patient: OBIE SILOS  Procedure(s) Performed: Left foot removal of deep implants (Left Foot)     Patient location during evaluation: PACU Anesthesia Type: General Level of consciousness: awake and alert Pain management: pain level controlled Vital Signs Assessment: post-procedure vital signs reviewed and stable Respiratory status: spontaneous breathing, nonlabored ventilation, respiratory function stable and patient connected to nasal cannula oxygen Cardiovascular status: blood pressure returned to baseline and stable Postop Assessment: no apparent nausea or vomiting Anesthetic complications: no    Last Vitals:  Vitals:   05/20/18 1215 05/20/18 1230  BP: (!) 141/82 (!) 146/95  Pulse: 74 72  Resp: 17 15  Temp:    SpO2: 100% 94%    Last Pain:  Vitals:   05/20/18 1230  TempSrc:   PainSc: 4                  Ermelinda Eckert

## 2018-05-20 NOTE — Transfer of Care (Signed)
Immediate Anesthesia Transfer of Care Note  Patient: Debbie Hayes  Procedure(s) Performed: Left foot removal of deep implants (Left Foot)  Patient Location: PACU  Anesthesia Type:General  Level of Consciousness: awake, alert  and oriented  Airway & Oxygen Therapy: Patient Spontanous Breathing and Patient connected to face mask oxygen  Post-op Assessment: Report given to RN and Post -op Vital signs reviewed and stable  Post vital signs: Reviewed and stable  Last Vitals:  Vitals Value Taken Time  BP    Temp    Pulse    Resp    SpO2      Last Pain:  Vitals:   05/20/18 1009  TempSrc: Oral  PainSc: 0-No pain         Complications: No apparent anesthesia complications

## 2018-05-20 NOTE — H&P (Signed)
Debbie Hayes is an 67 y.o. female.   Chief Complaint: left foot pain HPI: The patient is a 67 year old female with past medical history significant for chronic pain and fibromyalgia.  She underwent first and second TMT joint arthrodesis for midfoot arthritis and has healed.  She complains of pain at the hardware.  She has failed nonoperative treatment to date and presents today for removal of the deep implants.  Past Medical History:  Diagnosis Date  . Allergy   . Cancer [facility])    thyroid cancer  . Cataract   . Chronic migraine BOTOX INJECTION EVERY 3 MONTHS  . Chronic pain in right shoulder   . Constipation   . Diabetes mellitus   . Disorder of inner ear CAUSES VERTIGO OCCASIONALLY  . Fibromyalgia   . GERD (gastroesophageal reflux disease)   . Hemorrhoid   . History of bladder infections   . History of thyroid cancer 2009  S/P TOTAL THYROIDECTOMY AND RADIATION  . Hyperlipidemia   . Hypertension CARDIOLOGIST- DR Wynonia Lawman- WILL REQUEST LATE NOTE   DENIES S & S  . Hypothyroidism   . Insomnia   . Left shoulder pain   . Macular degeneration   . MRSA infection    nasal treated more than 15 years ago  . Normal nuclear stress test 01-25-2009  . OA (osteoarthritis) JOINTS  . Osteopenia   . Painful orthopaedic hardware ([place])    left foot  . PONV (postoperative nausea and vomiting) SEVERE  . Rotator cuff disorder LEFT SHOULDER RTC IMPINGMENT  . Sleep apnea    mouth guard, no cpap   . Sleep apnea in adult 06/26/2017  . Spondylolisthesis of lumbar region   . 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 Wears glasses     Past Surgical History:  Procedure Laterality Date  . BACK SURGERY     Dr Arnoldo Morale- spondylosis  . BILATERAL CARPAL TUNNEL RELEASE  1994  . BILATERAL ELBOW SURG.  1999  . BILATERAL SALPINGOOPHECTOMY  1993   POST-OP URETER REPAIR 12 DAYS AFTER   . COLONOSCOPY    . FOOT ARTHRODESIS Left 07/10/2016   Procedure: LEFT 2ND TARSAL METATARSAL ARTHRODESIS  GASTROC  RECESSION LEFT LAPIDUS MODIFIED MCBRIDE BUIONECTOMY;  Surgeon: Wylene Simmer, MD;  Location: [place];  Service: Orthopedics;  Laterality: Left;  Marland Kitchen GASTROC RECESSION EXTREMITY Left 07/10/2016   Procedure: GASTROC RECESSION EXTREMITY;  Surgeon: Wylene Simmer, MD;  Location: [place];  Service: Orthopedics;  Laterality: Left;  . LEFT THUMB JOINT REPLACEMENT  2005   RIGHT DONE IN 2004  . OOPHORECTOMY    . POLYPECTOMY    . RIGHT KNEE ARTHROSCOPY  X2  BEFORE 2011  . RIGHT KNEE ARTHROSCOPY/ PARTIAL LATERAL MENISECTOMY/ TRICOMPARTMENT CHONDROPLASTY/ DECOMPRESSION CYST  10-26-2009  . RIGHT KNEE CLOSED MANIPULATION  09-11-2010  . RIGHT SHOULDER ARTHROSCOPY  2010  &  2004  . TIBIA CYST REMOVED AND ORIF LEG FX  1958  . TONSILLECTOMY  1968  . TOTAL KNEE ARTHROPLASTY  07-16-2010   RIGHT  . TOTAL THYROIDECTOMY  2009   CANCER  (POST-OP BLEED)  AND RADIATION [place]  . trigger fingers Right 2013   3rd and 4th fingers  . UPPER GASTROINTESTINAL ENDOSCOPY    . VAGINAL HYSTERECTOMY  1990    Family History  Problem Relation Age of Onset  . Colon polyps Sister 59  . Alzheimer's disease Mother   . Heart disease Father   . Cirrhosis Father   . Heart attack  Father   . Macular degeneration Father   . Colon cancer Neg Hx    Social History:  reports that she has never smoked. She has never used smokeless tobacco. She reports that she drinks alcohol. She reports that she does not use drugs.  Allergies:  Allergies  Allergen Reactions  . Ciprofloxacin Diarrhea    Neurological effect   . Statins     Pt has tried multiple and cannot tolerate  . Sulfa Antibiotics Hives    Medications Prior to Admission  Medication Sig Dispense Refill  . acetaminophen (TYLENOL) 500 MG tablet Take 500 mg by mouth every 6 (six) hours as needed.    . cetirizine (ZYRTEC) 10 MG tablet Take 10 mg by mouth daily as needed for allergies.     Marland Kitchen diclofenac (FLECTOR) 1.3 % PTCH PLACE 1 PATCH ONTO THE SKIN  2 TIMES DAILY. 180 patch PRN  . esomeprazole (NEXIUM) 40 MG capsule Take 40 mg by mouth daily at 12 noon.    . Galcanezumab-gnlm (EMGALITY) 120 MG/ML SOAJ Inject 1 pen into the skin every 30 (thirty) days. 1 pen 11  . HYDROcodone-acetaminophen (NORCO/VICODIN) 5-325 MG tablet Take 1 or 1.5 every 12 hours as needed for pain 40 tablet 0  . levothyroxine (SYNTHROID, LEVOTHROID) 125 MCG tablet Take 1 tablet (125 mcg total) by mouth daily before breakfast. 90 tablet 3  . losartan (COZAAR) 50 MG tablet TAKE 1 TABLET BY MOUTH DAILY. 90 tablet 3  . metaxalone (SKELAXIN) 800 MG tablet TAKE 1 TABLET BY MOUTH 3 TIMES DAILY. 90 tablet 3  . metFORMIN (GLUCOPHAGE) 1000 MG tablet Take 1 tablet (1,000 mg total) by mouth 2 (two) times daily with a meal. 180 tablet 1  . metoprolol succinate (TOPROL-XL) 50 MG 24 hr tablet Take 1 tablet (50 mg total) by mouth every morning. 90 tablet 3  . minoxidil (ROGAINE) 2 % external solution Apply topically 2 (two) times daily.    . montelukast (SINGULAIR) 10 MG tablet TAKE 1 TABLET BY MOUTH AT BEDTIME. 90 tablet 1  . Multiple Vitamins-Minerals (ICAPS AREDS 2) CAPS Take by mouth.    . naproxen sodium (ALEVE) 220 MG tablet Take 440 mg by mouth.    Marland Kitchen NASONEX 50 MCG/ACT nasal spray PLACE 2 SPRAYS INTO THE NOSE DAILY. 17 g 9  . nitrofurantoin (MACRODANTIN) 50 MG capsule Take 50 mg by mouth at bedtime.     . OnabotulinumtoxinA (BOTOX IJ) Inject as directed.    . polyethylene glycol (MIRALAX / GLYCOLAX) packet Take 17 g by mouth daily.    . sitaGLIPtin (JANUVIA) 100 MG tablet Take 1 tablet (100 mg total) by mouth daily. 90 tablet 3  . traZODone (DESYREL) 100 MG tablet Take 2.5 tablets (250 mg total) by mouth at bedtime. 225 tablet 3  . zolpidem (AMBIEN) 5 MG tablet TAKE 1/2 TABLET BY MOUTH AT BEDTIME AS NEEDED FOR SLEEP 30 tablet 1  . azelastine (ASTELIN) 0.1 % nasal spray PLACE 2 SPRAYS INTO BOTH NOSTRILS 2 TIMES DAILY AS DIRECTED 90 mL 0  . blood glucose meter kit and supplies KIT  Test blood sugar once daily. Dx code: E11.9 1 each 0  . Blood Glucose Monitoring Suppl (FREESTYLE LITE) DEVI Check blood sugar no more than twice daily 1 each 0  . glucose blood (FREESTYLE LITE) test strip Check blood sugars no more than twice daily 100 each 12  . Lancets (FREESTYLE) lancets Check blood sugars no more than twice daily 100 each 12  . ondansetron (ZOFRAN)  4 MG tablet TAKE 1 TABLET BY MOUTH EVERY 8 HOURS AS NEEDED FOR NAUSEA AND VOMITING 30 tablet 1  . SUMAtriptan (IMITREX) 100 MG tablet Take 1 tablet (100 mg total) by mouth once for 1 dose. May repeat in 2 hours if headache persists or recurs. 10 tablet 5    Results for orders placed or performed during the hospital encounter of 08-Jun-2018 (from the past 48 hour(s))  I-STAT, chem 8     Status: Abnormal   Collection Time: 2018/06/08 10:09 AM  Result Value Ref Range   Sodium 139 135 - 145 mmol/L   Potassium 3.9 3.5 - 5.1 mmol/L   Chloride 103 98 - 111 mmol/L   BUN 15 8 - 23 mg/dL   Creatinine, Ser 0.60 0.44 - 1.00 mg/dL   Glucose, Bld 135 (H) 70 - 99 mg/dL   Calcium, Ion 1.13 (L) 1.15 - 1.40 mmol/L   TCO2 25 22 - 32 mmol/L   Hemoglobin 13.3 12.0 - 15.0 g/dL   HCT 39.0 36.0 - 46.0 %   No results found.  ROS no recent fever, chills, nausea, vomiting or changes in her appetite  Blood pressure 122/66, pulse 70, temperature 98.5 F (36.9 C), temperature source Oral, resp. rate 12, height '5\' 8"'$  (1.727 m), weight 80.1 kg, SpO2 96 %. Physical Exam  Well-nourished well-developed woman in no apparent distress.  Alert and oriented x4.  Mood and affect are normal.  Extraocular motions are intact.  Respirations are unlabored.  Gait is normal.  Left foot has healed surgical incisions.  No swelling or signs of infection.  Pulses are palpable.  No lymphadenopathy.  Sensibility to light touch is intact dorsally and plantarly at the forefoot.  5 out of 5 strength in plantar flexion and dorsi flexion of the ankle and  toes.  Assessment/Plan Left foot painful hardware at the medial cuneiform and first and second metatarsals.  To the operating room today for removal of the deep implants.  The risks and benefits of the alternative treatment options have been discussed in detail.  The patient wishes to proceed with surgery and specifically understands risks of bleeding, infection, nerve damage, blood clots, need for additional surgery, amputation and death.   Wylene Simmer, MD Jun 08, 2018, 10:47 AM

## 2018-05-21 ENCOUNTER — Encounter (HOSPITAL_BASED_OUTPATIENT_CLINIC_OR_DEPARTMENT_OTHER): Payer: Self-pay | Admitting: Orthopedic Surgery

## 2018-05-24 ENCOUNTER — Encounter (INDEPENDENT_AMBULATORY_CARE_PROVIDER_SITE_OTHER): Payer: 59 | Admitting: Ophthalmology

## 2018-05-24 DIAGNOSIS — H2513 Age-related nuclear cataract, bilateral: Secondary | ICD-10-CM | POA: Diagnosis not present

## 2018-05-24 DIAGNOSIS — H35033 Hypertensive retinopathy, bilateral: Secondary | ICD-10-CM | POA: Diagnosis not present

## 2018-05-24 DIAGNOSIS — I10 Essential (primary) hypertension: Secondary | ICD-10-CM

## 2018-05-24 DIAGNOSIS — H43813 Vitreous degeneration, bilateral: Secondary | ICD-10-CM

## 2018-05-24 DIAGNOSIS — H3553 Other dystrophies primarily involving the sensory retina: Secondary | ICD-10-CM

## 2018-05-24 MED FILL — LEVOTHYROXINE 125 MCG TABLE: 125 | 90 days supply | Qty: 90 | Fill #1

## 2018-05-24 MED FILL — metFORMIN HCL 1000 MG TABS: 1000 | 90 days supply | Qty: 180 | Fill #1

## 2018-05-25 ENCOUNTER — Ambulatory Visit: Payer: 59 | Admitting: Physical Therapy

## 2018-05-25 MED FILL — NITROFURANTOIN MCR 50 MG CA: 50 | 30 days supply | Qty: 30 | Fill #0

## 2018-05-27 ENCOUNTER — Ambulatory Visit: Payer: 59 | Admitting: Physical Therapy

## 2018-05-28 DIAGNOSIS — H04123 Dry eye syndrome of bilateral lacrimal glands: Secondary | ICD-10-CM | POA: Diagnosis not present

## 2018-05-31 DIAGNOSIS — N3020 Other chronic cystitis without hematuria: Secondary | ICD-10-CM | POA: Diagnosis not present

## 2018-06-01 NOTE — Progress Notes (Addendum)
[place] [facility] [address] [place], [place] [address] [phone]  Date:  06/07/2018   Name:  Debbie Hayes   DOB:  [birth year]   MRN:  [id number]  PCP:  Darreld Mclean, MD    Chief Complaint: Fatigue (extreme tiredness, few months, no energy)   History of Present Illness:  Debbie Hayes is a 67 y.o. very pleasant female patient who presents with the following:  Here today with c/o feeling very tired She has noted this for 6 months or so Also, she has felt less hungry and is getting less exercise.  She does do about 5k steps a day  She has been very busy- a lot of family things- but even though things are back to normal now   She is taking a nap most days; 20- 30 minutes, this is not new to her  She is treated for sleep apnea and is treated with an oral device (not CPAP), she feels like this is working well for her She may sleep 8 hours She does feel rested most mornings.  However some morning she might go back to bed within a couple of hours She is both tired nad sleepy She does not feel depressed  She is tapering down on her trazodone- she is planing to decrease by 25 mg per week She is using ambien once or twice a week  She did start on Emgality and botox for her chronic headaches She did just see her urologist who has her on macrodantin 50 daily- however she was just in and was negative   Lab Results  Component Value Date   TSH 1.45 11/09/2017   Lab Results  Component Value Date   HGBA1C 6.9 (H) 04/19/2018   She will tend to cough some- this is not new to her however No nightsweats, no fevers No rashes No weight loss Negative chest film a year ago   Wt Readings from Last 3 Encounters:  06/07/18 179 lb (81.2 kg)  05/20/18 176 lb 9.4 oz (80.1 kg)  04/19/18 178 lb (80.7 kg)   No CP or SOB No change in her exercise tolerance      Patient Active Problem List   Diagnosis Date Noted   . Chronic migraine with aura 12/15/2017  . Diabetes mellitus due to underlying condition with complication ([place]) 29/52/8413  . 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 Arthralgia 12/15/2017  . OSA (obstructive sleep apnea) 06/26/2017  . Snoring 03/25/2016  . Palpitations 03/25/2016  . Sleep related headaches 03/25/2016  . Trigeminal anesthesia 11/26/2015  . Spondylolisthesis of lumbar region 08/09/2014  . Back pain 02/17/2014  . Macular degeneration 02/17/2014  . Other and unspecified hyperlipidemia 10/14/2013  . Onychomycosis 12/01/2012  . Arthritis 04/12/2012  . Hypothyroid 04/12/2012    Past Medical History:  Diagnosis Date  . Allergy   . Cancer [facility])    thyroid cancer  . Cataract   . Chronic migraine BOTOX INJECTION EVERY 3 MONTHS  . Chronic pain in right shoulder   . Constipation   . Diabetes mellitus   . Disorder of inner ear CAUSES VERTIGO OCCASIONALLY  . Fibromyalgia   . GERD (gastroesophageal reflux disease)   . Hemorrhoid   . History of bladder infections   . History of thyroid cancer 2009  S/P TOTAL THYROIDECTOMY AND RADIATION  . Hyperlipidemia   . Hypertension CARDIOLOGIST- DR Wynonia Lawman- WILL REQUEST LATE  NOTE   DENIES S & S  . Hypothyroidism   . Insomnia   . Left shoulder pain   . Macular degeneration   . MRSA infection    nasal treated more than 15 years ago  . Normal nuclear stress test 01-25-2009  . OA (osteoarthritis) JOINTS  . Osteopenia   . Painful orthopaedic hardware ([place])    left foot  . PONV (postoperative nausea and vomiting) SEVERE  . Rotator cuff disorder LEFT SHOULDER RTC IMPINGMENT  . Sleep apnea    mouth guard, no cpap   . Sleep apnea in adult 06/26/2017  . Spondylolisthesis of lumbar region   . 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 Wears glasses     Past Surgical History:  Procedure Laterality Date  . BACK SURGERY     Dr Arnoldo Morale- spondylosis  . BILATERAL CARPAL TUNNEL RELEASE  1994   . BILATERAL ELBOW SURG.  1999  . BILATERAL SALPINGOOPHECTOMY  1993   POST-OP URETER REPAIR 12 DAYS AFTER   . COLONOSCOPY    . FOOT ARTHRODESIS Left 07/10/2016   Procedure: LEFT 2ND TARSAL METATARSAL ARTHRODESIS  GASTROC RECESSION LEFT LAPIDUS MODIFIED MCBRIDE BUIONECTOMY;  Surgeon: Wylene Simmer, MD;  Location: [place];  Service: Orthopedics;  Laterality: Left;  Marland Kitchen GASTROC RECESSION EXTREMITY Left 07/10/2016   Procedure: GASTROC RECESSION EXTREMITY;  Surgeon: Wylene Simmer, MD;  Location: [place];  Service: Orthopedics;  Laterality: Left;  . HARDWARE REMOVAL Left 05/20/2018   Procedure: Left foot removal of deep implants;  Surgeon: Wylene Simmer, MD;  Location: [place];  Service: Orthopedics;  Laterality: Left;  94mn  . LEFT THUMB JOINT REPLACEMENT  2005   RIGHT DONE IN 2004  . OOPHORECTOMY    . POLYPECTOMY    . RIGHT KNEE ARTHROSCOPY  X2  BEFORE 2011  . RIGHT KNEE ARTHROSCOPY/ PARTIAL LATERAL MENISECTOMY/ TRICOMPARTMENT CHONDROPLASTY/ DECOMPRESSION CYST  10-26-2009  . RIGHT KNEE CLOSED MANIPULATION  09-11-2010  . RIGHT SHOULDER ARTHROSCOPY  2010  &  2004  . TIBIA CYST REMOVED AND ORIF LEG FX  1958  . TONSILLECTOMY  1968  . TOTAL KNEE ARTHROPLASTY  07-16-2010   RIGHT  . TOTAL THYROIDECTOMY  2009   CANCER  (POST-OP BLEED)  AND RADIATION TX  . trigger fingers Right 2013   3rd and 4th fingers  . UPPER GASTROINTESTINAL ENDOSCOPY    . VAGINAL HYSTERECTOMY  1990    Social History   Tobacco Use  . Smoking status: Never Smoker  . Smokeless tobacco: Never Used  Substance Use Topics  . Alcohol use: Yes    Comment: RARE  . Drug use: No    Family History  Problem Relation Age of Onset  . Colon polyps Sister 594 . Alzheimer's disease Mother   . Heart disease Father   . Cirrhosis Father   . Heart attack Father   . Macular degeneration Father   . Colon cancer Neg Hx     Allergies  Allergen Reactions  . Ciprofloxacin Diarrhea     Neurological effect   . Sulfa Antibiotics Hives    Medication list has been reviewed and updated.  Current Outpatient Medications on File Prior to Visit  Medication Sig Dispense Refill  . acetaminophen (TYLENOL) 500 MG tablet Take 500 mg by mouth every 6 (six) hours as needed.    .Marland Kitchenazelastine (ASTELIN) 0.1 % nasal spray PLACE 2 SPRAYS INTO BOTH NOSTRILS 2 TIMES DAILY AS DIRECTED 90 mL 0  .  blood glucose meter kit and supplies KIT Test blood sugar once daily. Dx code: E11.9 1 each 0  . Blood Glucose Monitoring Suppl (FREESTYLE LITE) DEVI Check blood sugar no more than twice daily 1 each 0  . cetirizine (ZYRTEC) 10 MG tablet Take 10 mg by mouth daily as needed for allergies.     Marland Kitchen diclofenac (FLECTOR) 1.3 % PTCH PLACE 1 PATCH ONTO THE SKIN 2 TIMES DAILY. 180 patch PRN  . esomeprazole (NEXIUM) 40 MG capsule Take 40 mg by mouth daily at 12 noon.    . Galcanezumab-gnlm (EMGALITY) 120 MG/ML SOAJ Inject 1 pen into the skin every 30 (thirty) days. 1 pen 11  . glucose blood (FREESTYLE LITE) test strip Check blood sugars no more than twice daily 100 each 12  . Lancets (FREESTYLE) lancets Check blood sugars no more than twice daily 100 each 12  . levothyroxine (SYNTHROID, LEVOTHROID) 125 MCG tablet Take 1 tablet (125 mcg total) by mouth daily before breakfast. 90 tablet 3  . losartan (COZAAR) 50 MG tablet TAKE 1 TABLET BY MOUTH DAILY. 90 tablet 3  . metaxalone (SKELAXIN) 800 MG tablet TAKE 1 TABLET BY MOUTH 3 TIMES DAILY. 90 tablet 3  . metFORMIN (GLUCOPHAGE) 1000 MG tablet Take 1 tablet (1,000 mg total) by mouth 2 (two) times daily with a meal. 180 tablet 1  . metoprolol succinate (TOPROL-XL) 50 MG 24 hr tablet Take 1 tablet (50 mg total) by mouth every morning. 90 tablet 3  . minoxidil (ROGAINE) 2 % external solution Apply topically 2 (two) times daily.    . montelukast (SINGULAIR) 10 MG tablet TAKE 1 TABLET BY MOUTH AT BEDTIME. 90 tablet 1  . Multiple Vitamins-Minerals (ICAPS AREDS 2) CAPS Take by  mouth.    . naproxen sodium (ALEVE) 220 MG tablet Take 440 mg by mouth.    Marland Kitchen NASONEX 50 MCG/ACT nasal spray PLACE 2 SPRAYS INTO THE NOSE DAILY. 17 g 9  . nitrofurantoin (MACRODANTIN) 50 MG capsule Take 50 mg by mouth at bedtime.     . OnabotulinumtoxinA (BOTOX IJ) Inject as directed.    . ondansetron (ZOFRAN) 4 MG tablet TAKE 1 TABLET BY MOUTH EVERY 8 HOURS AS NEEDED FOR NAUSEA AND VOMITING 30 tablet 1  . sitaGLIPtin (JANUVIA) 100 MG tablet Take 1 tablet (100 mg total) by mouth daily. 90 tablet 3  . traZODone (DESYREL) 100 MG tablet Take 2.5 tablets (250 mg total) by mouth at bedtime. 225 tablet 3  . zolpidem (AMBIEN) 5 MG tablet TAKE 1/2 TABLET BY MOUTH AT BEDTIME AS NEEDED FOR SLEEP 30 tablet 1  . SUMAtriptan (IMITREX) 100 MG tablet Take 1 tablet (100 mg total) by mouth once for 1 dose. May repeat in 2 hours if headache persists or recurs. 10 tablet 5   No current facility-administered medications on file prior to visit.     Review of Systems:  As per HPI- otherwise negative.  No low blood sugars She is generally 110- 120s  Physical Examination: Vitals:   06/07/18 1514  BP: 132/80  Pulse: 74  Resp: 16  Temp: 97.7 F (36.5 C)  SpO2: 95%   Vitals:   06/07/18 1514  Weight: 179 lb (81.2 kg)  Height: '5\' 8"'$  (1.727 m)   Body mass index is 27.22 kg/m. Ideal Body Weight: Weight in (lb) to have BMI = 25: 164.1  GEN: WDWN, NAD, Non-toxic, A & O x 3 HEENT: Atraumatic, Normocephalic. Neck supple. No masses, No LAD. Ears and Nose: No external deformity.  CV: RRR, No M/G/R. No JVD. No thrill. No extra heart sounds. PULM: CTA B, no wheezes, crackles, rhonchi. No retractions. No resp. distress. No accessory muscle use. ABD: S, NT, ND, +BS. No rebound. No HSM. EXTR: No c/c/e NEURO Normal gait.  PSYCH: Normally interactive. Conversant. Not depressed or anxious appearing.  Calm demeanor.    Assessment and Plan: Chronic fatigue - Plan: Vitamin D (25 hydroxy), CBC, Comprehensive  metabolic panel, TSH, Sedimentation rate  Essential hypertension - Plan: metoprolol succinate (TOPROL-XL) 25 MG 24 hr tablet  Here today with concern of fatigue Labs pending as above She had tried increasing her toprol to 50 for palpitations but this did not help so she went back down to '25mg'$ . Did not notice any change in her fatigue between 25 and 50 mg however   Signed Lamar Blinks, MD  addd 11/12- received her labs, message to pt  Results for orders placed or performed in visit on 06/07/18  Vitamin D (25 hydroxy)  Result Value Ref Range   VITD 34.40 30.00 - 100.00 ng/mL  CBC  Result Value Ref Range   WBC 6.3 4.0 - 10.5 K/uL   RBC 4.67 3.87 - 5.11 Mil/uL   Platelets 243.0 150.0 - 400.0 K/uL   Hemoglobin 12.7 12.0 - 15.0 g/dL   HCT 38.8 36.0 - 46.0 %   MCV 83.1 78.0 - 100.0 fl   MCHC 32.8 30.0 - 36.0 g/dL   RDW 15.3 11.5 - 15.5 %  Comprehensive metabolic panel  Result Value Ref Range   Sodium 139 135 - 145 mEq/L   Potassium 3.9 3.5 - 5.1 mEq/L   Chloride 100 96 - 112 mEq/L   CO2 28 19 - 32 mEq/L   Glucose, Bld 139 (H) 70 - 99 mg/dL   BUN 16 6 - 23 mg/dL   Creatinine, Ser 0.63 0.40 - 1.20 mg/dL   Total Bilirubin 0.3 0.2 - 1.2 mg/dL   Alkaline Phosphatase 60 39 - 117 U/L   AST 14 0 - 37 U/L   ALT 11 0 - 35 U/L   Total Protein 6.6 6.0 - 8.3 g/dL   Albumin 4.3 3.5 - 5.2 g/dL   Calcium 9.3 8.4 - 10.5 mg/dL   GFR 99.61 >60.00 mL/min  TSH  Result Value Ref Range   TSH 0.74 0.35 - 4.50 uIU/mL  Sedimentation rate  Result Value Ref Range   Sed Rate 4 0 - 30 mm/hr

## 2018-06-07 ENCOUNTER — Encounter: Payer: Self-pay | Admitting: Family Medicine

## 2018-06-07 ENCOUNTER — Ambulatory Visit: Payer: 59 | Admitting: Family Medicine

## 2018-06-07 VITALS — BP 132/80 | HR 74 | Temp 97.7°F | Resp 16 | Ht 68.0 in | Wt 179.0 lb

## 2018-06-07 DIAGNOSIS — I10 Essential (primary) hypertension: Secondary | ICD-10-CM | POA: Diagnosis not present

## 2018-06-07 DIAGNOSIS — R5382 Chronic fatigue, unspecified: Secondary | ICD-10-CM

## 2018-06-07 MED ORDER — METOPROLOL SUCCINATE ER 25 MG PO TB24: 25.0000 mg | ORAL_TABLET | Freq: Every morning | ORAL | 3 refills | Status: DC

## 2018-06-07 NOTE — Patient Instructions (Addendum)
It was good to see you today- I will be in touch with your labs asap  Let me know if things are changing or getting worse  Doing some water exercise might be helpful for you as well

## 2018-06-08 ENCOUNTER — Encounter: Payer: Self-pay | Admitting: Family Medicine

## 2018-06-08 LAB — CBC
HCT: 38.8 % (ref 36.0–46.0)
HEMOGLOBIN: 12.7 g/dL (ref 12.0–15.0)
MCHC: 32.8 g/dL (ref 30.0–36.0)
MCV: 83.1 fl (ref 78.0–100.0)
PLATELETS: 243 10*3/uL (ref 150.0–400.0)
RBC: 4.67 Mil/uL (ref 3.87–5.11)
RDW: 15.3 % (ref 11.5–15.5)
WBC: 6.3 10*3/uL (ref 4.0–10.5)

## 2018-06-08 LAB — SEDIMENTATION RATE: SED RATE: 4 mm/h (ref 0–30)

## 2018-06-08 LAB — TSH: TSH: 0.74 u[IU]/mL (ref 0.35–4.50)

## 2018-06-08 LAB — COMPREHENSIVE METABOLIC PANEL
ALBUMIN: 4.3 g/dL (ref 3.5–5.2)
ALT: 11 U/L (ref 0–35)
AST: 14 U/L (ref 0–37)
Alkaline Phosphatase: 60 U/L (ref 39–117)
BILIRUBIN TOTAL: 0.3 mg/dL (ref 0.2–1.2)
BUN: 16 mg/dL (ref 6–23)
CALCIUM: 9.3 mg/dL (ref 8.4–10.5)
CHLORIDE: 100 meq/L (ref 96–112)
CO2: 28 mEq/L (ref 19–32)
CREATININE: 0.63 mg/dL (ref 0.40–1.20)
GFR: 99.61 mL/min (ref >60.00)
Glucose, Bld: 139 mg/dL — ABNORMAL HIGH (ref 70–99)
Potassium: 3.9 mEq/L (ref 3.5–5.1)
Sodium: 139 mEq/L (ref 135–145)
Total Protein: 6.6 g/dL (ref 6.0–8.3)

## 2018-06-08 LAB — VITAMIN D 25 HYDROXY (VIT D DEFICIENCY, FRACTURES): VITD: 34.4 ng/mL (ref 30.00–100.00)

## 2018-06-16 MED FILL — MONTELUKAST SOD 10 MG TAB: 10 | 90 days supply | Qty: 90 | Fill #1

## 2018-06-16 MED FILL — LOSARTAN POTASSIUM 50 MG TA: 50 | 30 days supply | Qty: 30 | Fill #3

## 2018-06-21 MED FILL — METOPROLOL SUCCINATE ER 25: 25 | 90 days supply | Qty: 90 | Fill #0

## 2018-06-28 MED FILL — RIZATRIPTAN BENZOATE 10 MG: 10 | 30 days supply | Qty: 10 | Fill #3

## 2018-07-05 ENCOUNTER — Encounter: Payer: Self-pay | Admitting: Family Medicine

## 2018-07-05 ENCOUNTER — Ambulatory Visit: Payer: 59 | Admitting: Family Medicine

## 2018-07-05 VITALS — BP 120/78 | HR 64 | Temp 97.8°F | Ht 68.0 in | Wt 179.0 lb

## 2018-07-05 DIAGNOSIS — N3000 Acute cystitis without hematuria: Secondary | ICD-10-CM

## 2018-07-05 DIAGNOSIS — I10 Essential (primary) hypertension: Secondary | ICD-10-CM

## 2018-07-05 LAB — POC URINALSYSI DIPSTICK (AUTOMATED)
BILIRUBIN UA: NEGATIVE
Blood, UA: NEGATIVE
Clarity, UA: NEGATIVE
GLUCOSE UA: NEGATIVE
KETONES UA: NEGATIVE
NITRITE UA: POSITIVE
PH UA: 6 (ref 5.0–8.0)
Protein, UA: NEGATIVE
Spec Grav, UA: 1.01 (ref 1.010–1.025)
Urobilinogen, UA: 1 E.U./dL

## 2018-07-05 MED ORDER — METOPROLOL SUCCINATE ER 50 MG PO TB24: 50.0000 mg | ORAL_TABLET | Freq: Every day | ORAL | 3 refills | Status: DC

## 2018-07-05 MED ORDER — CEFDINIR 300 MG PO CAPS: 300.0000 mg | ORAL_CAPSULE | Freq: Two times a day (BID) | ORAL | 0 refills | Status: AC

## 2018-07-05 MED FILL — METOPROLOL SUCCINATE ER 50: 50 | 90 days supply | Qty: 90 | Fill #0

## 2018-07-05 MED FILL — CEFDINIR 300 MG CAPSULE: 300 | 7 days supply | Qty: 14 | Fill #0

## 2018-07-05 NOTE — Patient Instructions (Signed)
Stay hydrated.   Warning signs/symptoms: Uncontrollable nausea/vomiting, fevers, worsening symptoms despite treatment, confusion.  Give us around 2 business days to get culture back to you.  Let us know if you need anything. 

## 2018-07-05 NOTE — Progress Notes (Signed)
Pre visit review using our clinic review tool, if applicable. No additional management support is needed unless otherwise documented below in the visit note. 

## 2018-07-05 NOTE — Addendum Note (Signed)
Addended by: Sharon Seller B on: 07/05/2018 02:49 PM   Modules accepted: Orders

## 2018-07-05 NOTE — Progress Notes (Signed)
Chief Complaint  Patient presents with  . Dysuria  . Urinary Frequency    Debbie Hayes is a 67 y.o. female here for possible UTI.  Duration: 3 days. Symptoms: urinary frequency and burning Denies: hematuria, fever, nausea and vomiting, vaginal discharge Hx of recurrent UTI? Yes  ROS:  Constitutional: denies fever GU: As noted in HPI  Past Medical History:  Diagnosis Date  . Allergy   . Cancer [facility])    thyroid cancer  . Cataract   . Chronic migraine BOTOX INJECTION EVERY 3 MONTHS  . Chronic pain in right shoulder   . Constipation   . Diabetes mellitus   . Disorder of inner ear CAUSES VERTIGO OCCASIONALLY  . Fibromyalgia   . GERD (gastroesophageal reflux disease)   . Hemorrhoid   . History of bladder infections   . History of thyroid cancer 2009  S/P TOTAL THYROIDECTOMY AND RADIATION  . Hyperlipidemia   . Hypertension CARDIOLOGIST- DR Wynonia Lawman- WILL REQUEST LATE NOTE   DENIES S & S  . Hypothyroidism   . Insomnia   . Left shoulder pain   . Macular degeneration   . MRSA infection    nasal treated more than 15 years ago  . Normal nuclear stress test 01-25-2009  . OA (osteoarthritis) JOINTS  . Osteopenia   . Painful orthopaedic hardware ([place])    left foot  . PONV (postoperative nausea and vomiting) SEVERE  . Rotator cuff disorder LEFT SHOULDER RTC IMPINGMENT  . Sleep apnea    mouth guard, no cpap   . Sleep apnea in adult 06/26/2017  . Spondylolisthesis of lumbar region   . 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 Wears glasses     BP 120/78 (BP Location: Left Arm, Patient Position: Sitting, Cuff Size: Normal)   Pulse 64   Temp 97.8 F (36.6 C) (Oral)   Ht 5\' 8"  (1.727 m)   Wt 179 lb (81.2 kg)   SpO2 95%   BMI 27.22 kg/m  General: Awake, alert, appears stated age Heart: RRR Lungs: CTAB, normal respiratory effort, no accessory muscle usage Abd: BS+, soft, NT, ND, no masses or organomegaly MSK: No CVA tenderness, neg Lloyd's sign Psych: Age  appropriate judgment and insight  Acute cystitis without hematuria - Plan: cefdinir (OMNICEF) 300 MG capsule  Essential hypertension  UA suggestive of infxn. [employer] as this is what her previous culture was susceptible from. Await culture. Stay hydrated. Seek immediate care if pt starts to develop fevers, new/worsening symptoms, uncontrollable N/V. F/u prn. The patient voiced understanding and agreement to the plan.  [place], DO 07/05/18 2:38 PM

## 2018-07-07 LAB — URINE CULTURE
MICRO NUMBER:: 91471028
SPECIMEN QUALITY: ADEQUATE

## 2018-07-12 MED FILL — LOSARTAN POTASSIUM 50 MG TA: 50 | 30 days supply | Qty: 30 | Fill #4

## 2018-07-12 MED FILL — NITROFURANTOIN MCR 50 MG CA: 50 | 30 days supply | Qty: 30 | Fill #0

## 2018-07-15 ENCOUNTER — Telehealth: Payer: Self-pay | Admitting: *Deleted

## 2018-07-15 ENCOUNTER — Encounter: Payer: Self-pay | Admitting: Neurology

## 2018-07-15 NOTE — Telephone Encounter (Signed)
Pt called accepted appt with NP on 1/9

## 2018-07-15 NOTE — Telephone Encounter (Signed)
Per note, pt has been called twice and sent letter to r/s botox appt as Dr. Jaynee Eagles is out of the office the day she had her appt scheduled 1/6. Per Jinny Blossom NP, she can do pt's Botox on Thurs 1/9 @ 10:30. When pt calls back, please check with Jinny Blossom to see if appt still available and pt can be r/s to this new date.

## 2018-07-15 NOTE — Telephone Encounter (Signed)
noted 

## 2018-07-19 DIAGNOSIS — H3553 Other dystrophies primarily involving the sensory retina: Secondary | ICD-10-CM | POA: Diagnosis not present

## 2018-07-19 DIAGNOSIS — H2513 Age-related nuclear cataract, bilateral: Secondary | ICD-10-CM | POA: Diagnosis not present

## 2018-07-19 DIAGNOSIS — H25012 Cortical age-related cataract, left eye: Secondary | ICD-10-CM | POA: Diagnosis not present

## 2018-07-19 DIAGNOSIS — H2512 Age-related nuclear cataract, left eye: Secondary | ICD-10-CM | POA: Diagnosis not present

## 2018-07-19 DIAGNOSIS — H25013 Cortical age-related cataract, bilateral: Secondary | ICD-10-CM | POA: Diagnosis not present

## 2018-07-19 DIAGNOSIS — H35363 Drusen (degenerative) of macula, bilateral: Secondary | ICD-10-CM | POA: Diagnosis not present

## 2018-07-19 MED FILL — KETOROLAC 0.5% OPHTH SOLN: 0.5 | 25 days supply | Qty: 5 | Fill #0

## 2018-07-19 MED FILL — PREDNISOLONE AC 1% EYE DROP: 1 | 25 days supply | Qty: 5 | Fill #0

## 2018-07-19 MED FILL — OFLOXACIN 0.3% EYE DROPS: 0.3 | 25 days supply | Qty: 5 | Fill #0

## 2018-07-26 MED FILL — RIZATRIPTAN BENZOATE 10 MG: 10 | 30 days supply | Qty: 10 | Fill #4

## 2018-07-26 MED FILL — JANUVIA 100 MG TABLET: 100 | 60 days supply | Qty: 60 | Fill #2

## 2018-08-01 NOTE — Progress Notes (Deleted)
[place] at [facility] [address] [place], [place] [address] [phone] [id number]  Date:  08/04/2018   Name:  Debbie Hayes   DOB:  1950/12/02   MRN:  [id number]  PCP:  Darreld Mclean, MD    Chief Complaint: No chief complaint on file.   History of Present Illness:  Debbie Hayes is a 68 y.o. very pleasant female patient who presents with the following:  Patient with history of diabetes, hypothyroidism, sleep apnea, chronic migraine, hyperlipidemia, chronic pain/fibromyalgia.  She had removal of painful hardware in the left foot per Dr. Doran Durand in October 2019  I saw her about 6 weeks ago with concern of chronic fatigue.  She was also in the office in early December with acute cystitis  Here today with concern of Lab Results  Component Value Date   HGBA1C 6.9 (H) 04/19/2018    Patient Active Problem List   Diagnosis Date Noted  . Chronic migraine with aura 12/15/2017  . Diabetes mellitus due to underlying condition with complication ([place]) 09/38/1829  . 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 Arthralgia 12/15/2017  . OSA (obstructive sleep apnea) 06/26/2017  . Snoring 03/25/2016  . Palpitations 03/25/2016  . Sleep related headaches 03/25/2016  . Trigeminal anesthesia 11/26/2015  . Spondylolisthesis of lumbar region 08/09/2014  . Back pain 02/17/2014  . Macular degeneration 02/17/2014  . Other and unspecified hyperlipidemia 10/14/2013  . Onychomycosis 12/01/2012  . Arthritis 04/12/2012  . Hypothyroid 04/12/2012    Past Medical History:  Diagnosis Date  . Allergy   . Cancer [facility])    thyroid cancer  . Cataract   . Chronic migraine BOTOX INJECTION EVERY 3 MONTHS  . Chronic pain in right shoulder   . Constipation   . Diabetes mellitus   . Disorder of inner ear CAUSES VERTIGO OCCASIONALLY  . Fibromyalgia   . GERD (gastroesophageal reflux disease)   . Hemorrhoid    . History of bladder infections   . History of thyroid cancer 2009  S/P TOTAL THYROIDECTOMY AND RADIATION  . Hyperlipidemia   . Hypertension CARDIOLOGIST- DR Wynonia Lawman- WILL REQUEST LATE NOTE   DENIES S & S  . Hypothyroidism   . Insomnia   . Left shoulder pain   . Macular degeneration   . MRSA infection    nasal treated more than 15 years ago  . Normal nuclear stress test 01-25-2009  . OA (osteoarthritis) JOINTS  . Osteopenia   . Painful orthopaedic hardware ([place])    left foot  . PONV (postoperative nausea and vomiting) SEVERE  . Rotator cuff disorder LEFT SHOULDER RTC IMPINGMENT  . Sleep apnea    mouth guard, no cpap   . Sleep apnea in adult 06/26/2017  . Spondylolisthesis of lumbar region   . 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 Wears glasses     Past Surgical History:  Procedure Laterality Date  . BACK SURGERY     Dr Arnoldo Morale- spondylosis  . BILATERAL CARPAL TUNNEL RELEASE  1994  . BILATERAL ELBOW SURG.  1999  . BILATERAL SALPINGOOPHECTOMY  1993   POST-OP URETER REPAIR 12 DAYS AFTER   . COLONOSCOPY    . FOOT ARTHRODESIS Left 07/10/2016   Procedure: LEFT 2ND TARSAL METATARSAL ARTHRODESIS  GASTROC RECESSION LEFT LAPIDUS MODIFIED MCBRIDE BUIONECTOMY;  Surgeon: Wylene Simmer, MD;  Location: [place];  Service: Orthopedics;  Laterality: Left;  Marland Kitchen GASTROC RECESSION EXTREMITY  Left 07/10/2016   Procedure: GASTROC RECESSION EXTREMITY;  Surgeon: Wylene Simmer, MD;  Location: [place];  Service: Orthopedics;  Laterality: Left;  . HARDWARE REMOVAL Left 05/20/2018   Procedure: Left foot removal of deep implants;  Surgeon: Wylene Simmer, MD;  Location: [place];  Service: Orthopedics;  Laterality: Left;  42mn  . LEFT THUMB JOINT REPLACEMENT  2005   RIGHT DONE IN 2004  . OOPHORECTOMY    . POLYPECTOMY    . RIGHT KNEE ARTHROSCOPY  X2  BEFORE 2011  . RIGHT KNEE ARTHROSCOPY/ PARTIAL LATERAL MENISECTOMY/ TRICOMPARTMENT CHONDROPLASTY/  DECOMPRESSION CYST  10-26-2009  . RIGHT KNEE CLOSED MANIPULATION  09-11-2010  . RIGHT SHOULDER ARTHROSCOPY  2010  &  2004  . TIBIA CYST REMOVED AND ORIF LEG FX  1958  . TONSILLECTOMY  1968  . TOTAL KNEE ARTHROPLASTY  07-16-2010   RIGHT  . TOTAL THYROIDECTOMY  2009   CANCER  (POST-OP BLEED)  AND RADIATION TX  . trigger fingers Right 2013   3rd and 4th fingers  . UPPER GASTROINTESTINAL ENDOSCOPY    . VAGINAL HYSTERECTOMY  1990    Social History   Tobacco Use  . Smoking status: Never Smoker  . Smokeless tobacco: Never Used  Substance Use Topics  . Alcohol use: Yes    Comment: RARE  . Drug use: No    Family History  Problem Relation Age of Onset  . Colon polyps Sister 528 . Alzheimer's disease Mother   . Heart disease Father   . Cirrhosis Father   . Heart attack Father   . Macular degeneration Father   . Colon cancer Neg Hx     Allergies  Allergen Reactions  . Ciprofloxacin Diarrhea    Neurological effect   . Sulfa Antibiotics Hives    Medication list has been reviewed and updated.  Current Outpatient Medications on File Prior to Visit  Medication Sig Dispense Refill  . acetaminophen (TYLENOL) 500 MG tablet Take 500 mg by mouth every 6 (six) hours as needed.    .Marland Kitchenazelastine (ASTELIN) 0.1 % nasal spray PLACE 2 SPRAYS INTO BOTH NOSTRILS 2 TIMES DAILY AS DIRECTED 90 mL 0  . blood glucose meter kit and supplies KIT Test blood sugar once daily. Dx code: E11.9 1 each 0  . Blood Glucose Monitoring Suppl (FREESTYLE LITE) DEVI Check blood sugar no more than twice daily 1 each 0  . cetirizine (ZYRTEC) 10 MG tablet Take 10 mg by mouth daily as needed for allergies.     .Marland Kitchendiclofenac (FLECTOR) 1.3 % PTCH PLACE 1 PATCH ONTO THE SKIN 2 TIMES DAILY. 180 patch PRN  . esomeprazole (NEXIUM) 40 MG capsule Take 40 mg by mouth daily at 12 noon.    . Galcanezumab-gnlm (EMGALITY) 120 MG/ML SOAJ Inject 1 pen into the skin every 30 (thirty) days. 1 pen 11  . glucose blood (FREESTYLE LITE)  test strip Check blood sugars no more than twice daily 100 each 12  . Lancets (FREESTYLE) lancets Check blood sugars no more than twice daily 100 each 12  . levothyroxine (SYNTHROID, LEVOTHROID) 125 MCG tablet Take 1 tablet (125 mcg total) by mouth daily before breakfast. 90 tablet 3  . losartan (COZAAR) 50 MG tablet TAKE 1 TABLET BY MOUTH DAILY. 90 tablet 3  . metaxalone (SKELAXIN) 800 MG tablet TAKE 1 TABLET BY MOUTH 3 TIMES DAILY. 90 tablet 3  . metFORMIN (GLUCOPHAGE) 1000 MG tablet Take 1 tablet (1,000 mg total) by mouth 2 (  two) times daily with a meal. 180 tablet 1  . metoprolol succinate (TOPROL-XL) 50 MG 24 hr tablet Take 1 tablet (50 mg total) by mouth daily. Take with or immediately following a meal. 90 tablet 3  . minoxidil (ROGAINE) 2 % external solution Apply topically 2 (two) times daily.    . montelukast (SINGULAIR) 10 MG tablet TAKE 1 TABLET BY MOUTH AT BEDTIME. 90 tablet 1  . Multiple Vitamins-Minerals (ICAPS AREDS 2) CAPS Take by mouth.    . naproxen sodium (ALEVE) 220 MG tablet Take 440 mg by mouth.    Marland Kitchen NASONEX 50 MCG/ACT nasal spray PLACE 2 SPRAYS INTO THE NOSE DAILY. 17 g 9  . nitrofurantoin (MACRODANTIN) 50 MG capsule Take 50 mg by mouth at bedtime.     . OnabotulinumtoxinA (BOTOX IJ) Inject as directed.    . ondansetron (ZOFRAN) 4 MG tablet TAKE 1 TABLET BY MOUTH EVERY 8 HOURS AS NEEDED FOR NAUSEA AND VOMITING 30 tablet 1  . sitaGLIPtin (JANUVIA) 100 MG tablet Take 1 tablet (100 mg total) by mouth daily. 90 tablet 3  . SUMAtriptan (IMITREX) 100 MG tablet Take 1 tablet (100 mg total) by mouth once for 1 dose. May repeat in 2 hours if headache persists or recurs. 10 tablet 5  . traZODone (DESYREL) 100 MG tablet Take 2.5 tablets (250 mg total) by mouth at bedtime. 225 tablet 3  . zolpidem (AMBIEN) 5 MG tablet TAKE 1/2 TABLET BY MOUTH AT BEDTIME AS NEEDED FOR SLEEP 30 tablet 1   No current facility-administered medications on file prior to visit.     Review of Systems:  As  per HPI- otherwise negative.   Physical Examination: There were no vitals filed for this visit. There were no vitals filed for this visit. There is no height or weight on file to calculate BMI. Ideal Body Weight:    GEN: WDWN, NAD, Non-toxic, A & O x 3 HEENT: Atraumatic, Normocephalic. Neck supple. No masses, No LAD. Ears and Nose: No external deformity. CV: RRR, No M/G/R. No JVD. No thrill. No extra heart sounds. PULM: CTA B, no wheezes, crackles, rhonchi. No retractions. No resp. distress. No accessory muscle use. ABD: S, NT, ND, +BS. No rebound. No HSM. EXTR: No c/c/e NEURO Normal gait.  PSYCH: Normally interactive. Conversant. Not depressed or anxious appearing.  Calm demeanor.    Assessment and Plan: ***  Signed Lamar Blinks, MD

## 2018-08-02 ENCOUNTER — Telehealth: Payer: Self-pay | Admitting: Adult Health

## 2018-08-02 ENCOUNTER — Ambulatory Visit: Payer: 59 | Admitting: Neurology

## 2018-08-02 NOTE — Telephone Encounter (Signed)
I called the patient's insurance to check pre cert requirements and active coverage. I spoke with [place]. Patient is active and [employer]. BWG#[id number].

## 2018-08-04 ENCOUNTER — Ambulatory Visit: Payer: 59 | Admitting: Family Medicine

## 2018-08-05 ENCOUNTER — Telehealth: Payer: Self-pay | Admitting: Adult Health

## 2018-08-05 ENCOUNTER — Ambulatory Visit (INDEPENDENT_AMBULATORY_CARE_PROVIDER_SITE_OTHER): Payer: 59 | Admitting: Adult Health

## 2018-08-05 ENCOUNTER — Encounter: Payer: Self-pay | Admitting: Adult Health

## 2018-08-05 VITALS — BP 130/63 | HR 84

## 2018-08-05 DIAGNOSIS — G43711 Chronic migraine without aura, intractable, with status migrainosus: Secondary | ICD-10-CM | POA: Diagnosis not present

## 2018-08-05 MED FILL — EMGALITY 120 MG/ML SOAJ: 120 | 30 days supply | Qty: 1 | Fill #6

## 2018-08-05 MED FILL — LOSARTAN POTASSIUM 50 MG TA: 50 | 30 days supply | Qty: 30 | Fill #5

## 2018-08-05 NOTE — Telephone Encounter (Signed)
12 wk Botox inj °

## 2018-08-05 NOTE — Progress Notes (Signed)
Agree with procedure    Sarina Ill, MD [facility]

## 2018-08-05 NOTE — Progress Notes (Signed)
       BOTOX PROCEDURE NOTE FOR MIGRAINE HEADACHE    Contraindications and precautions discussed with patient(above). Aseptic procedure was observed and patient tolerated procedure. Procedure performed by Ward Givens, NP  The condition has existed for more than 6 months, and pt does not have a diagnosis of ALS, Myasthenia Gravis or Lambert-Eaton Syndrome.  Risks and benefits of injections discussed and pt agrees to proceed with the procedure.  Written consent obtained.    These injections are medically necessary.Patient states that she had no headaches from Sept till nov. In the last two months she has had 22 headaches.These injections do not cause sedations or hallucinations which the oral therapies may cause.  Indication/Diagnosis: chronic migraine BOTOX(J0585) injection was performed according to protocol by [employer]. 200 units of BOTOX was dissolved into 4 cc NS.   NDC: [address]-73  Type of toxin: Botox  Lot # [id number] EXP: 11/2018   Description of procedure:  The patient was placed in a sitting position. The standard protocol was used for Botox as follows, with 5 units of Botox injected at each site:   -Procerus muscle- refused injection   -Corrugator muscle refused injection   -Frontalis muscle, bilateral injection, with 2 sites each side, medial injection was performed in the upper one third of the frontalis muscle, in the region vertical from the medial inferior edge of the superior orbital rim. The lateral injection was again in the upper one third of the forehead vertically above the lateral limbus of the cornea, 1.5 cm lateral to the medial injection site.  -Temporalis muscle injection, 4 sites, bilaterally. The first injection was 3 cm above the tragus of the ear, second injection site was 1.5 cm to 3 cm up from the first injection site in line with the tragus of the ear. The third injection site was 1.5-3 cm forward between the first 2 injection sites. The fourth  injection site was 1.5 cm posterior to the second injection site.  -Occipitalis muscle injection, 3 sites, bilaterally. The first injection was done one half way between the occipital protuberance and the tip of the mastoid process behind the ear. The second injection site was done lateral and superior to the first, 1 fingerbreadth from the first injection. The third injection site was 1 fingerbreadth superiorly and medially from the first injection site.  -Cervical paraspinal muscle injection, 2 sites, bilateral knee first injection site was 1 cm from the midline of the cervical spine, 3 cm inferior to the lower border of the occipital protuberance. The second injection site was 1.5 cm superiorly and laterally to the first injection site.  -Trapezius muscle injection was performed at 3 sites, bilaterally. The first injection site was in the upper trapezius muscle halfway between the inflection point of the neck, and the acromion. The second injection site was one half way between the acromion and the first injection site. The third injection was done between the first injection site and the inflection point of the neck.   Will return for repeat injection in 3 months.   A 200 unit sof Botox was used, 140 units were injected, the rest of the Botox was wasted. The patient tolerated the procedure well, there were no complications of the above procedure.  Ward Givens, MSN, NP-C 08/05/2018, 10:23 AM [facility] [address] [place], [place] [address] [phone]

## 2018-08-09 ENCOUNTER — Encounter: Payer: Self-pay | Admitting: Family Medicine

## 2018-08-09 MED ORDER — BENZONATATE 200 MG PO CAPS: 200.0000 mg | ORAL_CAPSULE | Freq: Two times a day (BID) | ORAL | 0 refills | Status: DC | PRN

## 2018-08-09 MED FILL — BENZONATATE 200 MG CAPS: 200 | 45 days supply | Qty: 90 | Fill #0

## 2018-08-10 MED FILL — NITROFURANTOIN MCR 50 MG CA: 50 | 30 days supply | Qty: 30 | Fill #1

## 2018-08-16 ENCOUNTER — Other Ambulatory Visit: Payer: Self-pay | Admitting: Family Medicine

## 2018-08-16 DIAGNOSIS — E088 Diabetes mellitus due to underlying condition with unspecified complications: Secondary | ICD-10-CM

## 2018-08-16 MED FILL — traZODone HCL 100 MG TABS: 100 | 90 days supply | Qty: 225 | Fill #2

## 2018-08-16 MED FILL — RIZATRIPTAN BENZOATE 10 MG: 10 | 30 days supply | Qty: 10 | Fill #5

## 2018-08-16 MED FILL — ZOLPIDEM TARTRATE 5 MG TAB: 5 | 60 days supply | Qty: 30 | Fill #1

## 2018-08-17 MED FILL — LEVOTHYROXINE 125 MCG TABLE: 125 | 90 days supply | Qty: 90 | Fill #2

## 2018-08-17 MED FILL — metFORMIN HCL 1000 MG TABS: 1000 | 90 days supply | Qty: 180 | Fill #0

## 2018-08-17 MED FILL — LOSARTAN POTASSIUM 50 MG TA: 50 | 30 days supply | Qty: 30 | Fill #6

## 2018-08-17 NOTE — Telephone Encounter (Signed)
I called and scheduled the patient. DW  °

## 2018-09-02 ENCOUNTER — Encounter: Payer: Self-pay | Admitting: Family Medicine

## 2018-09-02 DIAGNOSIS — G894 Chronic pain syndrome: Secondary | ICD-10-CM

## 2018-09-02 DIAGNOSIS — M549 Dorsalgia, unspecified: Secondary | ICD-10-CM

## 2018-09-02 MED ORDER — OXYCODONE-ACETAMINOPHEN 7.5-325 MG PO TABS: 1.0000 | ORAL_TABLET | Freq: Three times a day (TID) | ORAL | 0 refills | Status: DC | PRN

## 2018-09-02 NOTE — Telephone Encounter (Signed)
Debbie Hayes tends to use about 30 narcotic pills per year for joint pain/back pain She has a contract with me She did get oxycodone following surgery in October  Review of her [place]  08/16/2018  1   02/18/2018  Zolpidem Tartrate 5 MG Tablet  30.00 60 Je Cop   [id number]   Wes (0126)   1  0.12 LME  Comm Ins   [place]  05/20/2018  1   05/20/2018  Oxycodone Hcl 5 MG Tablet  30.00 5 Ju Oll   [id number]   Mos (0906)   0  45.00 MME  Comm Ins   [place]  02/18/2018  1   02/18/2018  Zolpidem Tartrate 5 MG Tablet  30.00 60 Je Cop   [id number]   Wes (0126)   0  0.12 LME  Comm Ins   [place]  12/08/2017  1   08/19/2017  Zolpidem Tartrate 5 MG Tablet  30.00 60 Je Cop   [id number]   Wes (0126)   1  0.12 LME  Comm Ins   [place]  11/17/2017  1   11/09/2017  Hydrocodone-Acetamin 5-325 MG  40.00 14 Je Cop   [id number]   Wes (0126)   0  14.29 MME  Comm Ins   [place]  09/04/2017  1   08/19/2017  Zolpidem Tartrate 5 MG Tablet  30.00 60 Je Cop   [id number]   Wes (0126)   0  0.12 LME  Comm Ins   [place]  02/16/2017  1   02/16/2017  Hydrocodone-Acetamin 5-325 MG  12.00 3 Ed Sag   [id number]   Med (5269)   0  20.00 MME  Comm Ins   [place]   Her UDS is up-to-date

## 2018-09-02 NOTE — Addendum Note (Signed)
Addended by: Lamar Blinks C on: 09/02/2018 09:48 PM   Modules accepted: Orders

## 2018-09-03 MED FILL — OXYCODONE/APAP 7.5/325MG: 7.5-325 | 10 days supply | Qty: 30 | Fill #0

## 2018-09-06 ENCOUNTER — Encounter: Payer: Self-pay | Admitting: Neurology

## 2018-09-07 DIAGNOSIS — I10 Essential (primary) hypertension: Secondary | ICD-10-CM | POA: Diagnosis not present

## 2018-09-07 DIAGNOSIS — M4316 Spondylolisthesis, lumbar region: Secondary | ICD-10-CM | POA: Diagnosis not present

## 2018-09-07 DIAGNOSIS — M545 Low back pain: Secondary | ICD-10-CM | POA: Diagnosis not present

## 2018-09-09 ENCOUNTER — Other Ambulatory Visit: Payer: Self-pay | Admitting: Neurology

## 2018-09-09 DIAGNOSIS — G43001 Migraine without aura, not intractable, with status migrainosus: Secondary | ICD-10-CM

## 2018-09-09 MED FILL — MONTELUKAST SOD 10 MG TAB: 10 | 90 days supply | Qty: 90 | Fill #2

## 2018-09-09 MED FILL — NITROFURANTOIN MCR 50 MG CA: 50 | 30 days supply | Qty: 30 | Fill #2

## 2018-09-13 ENCOUNTER — Other Ambulatory Visit: Payer: Self-pay | Admitting: Neurology

## 2018-09-13 DIAGNOSIS — G43001 Migraine without aura, not intractable, with status migrainosus: Secondary | ICD-10-CM

## 2018-09-13 MED FILL — EMGALITY 120 MG/ML SOAJ: 120 | 30 days supply | Qty: 1 | Fill #7

## 2018-09-14 DIAGNOSIS — H2512 Age-related nuclear cataract, left eye: Secondary | ICD-10-CM | POA: Diagnosis not present

## 2018-09-14 DIAGNOSIS — H25812 Combined forms of age-related cataract, left eye: Secondary | ICD-10-CM | POA: Diagnosis not present

## 2018-09-15 DIAGNOSIS — M4316 Spondylolisthesis, lumbar region: Secondary | ICD-10-CM | POA: Diagnosis not present

## 2018-09-21 ENCOUNTER — Other Ambulatory Visit: Payer: Self-pay | Admitting: Neurology

## 2018-09-21 DIAGNOSIS — M4316 Spondylolisthesis, lumbar region: Secondary | ICD-10-CM | POA: Diagnosis not present

## 2018-09-21 DIAGNOSIS — G43001 Migraine without aura, not intractable, with status migrainosus: Secondary | ICD-10-CM

## 2018-09-21 DIAGNOSIS — M48061 Spinal stenosis, lumbar region without neurogenic claudication: Secondary | ICD-10-CM | POA: Diagnosis not present

## 2018-09-21 DIAGNOSIS — M5116 Intervertebral disc disorders with radiculopathy, lumbar region: Secondary | ICD-10-CM | POA: Diagnosis not present

## 2018-09-21 MED FILL — JANUVIA 100 MG TABLET: 100 | 60 days supply | Qty: 60 | Fill #3

## 2018-09-22 MED FILL — RIZATRIPTAN BENZOATE 10 MG: 10 | 30 days supply | Qty: 10 | Fill #0

## 2018-10-04 DIAGNOSIS — Z96651 Presence of right artificial knee joint: Secondary | ICD-10-CM | POA: Diagnosis not present

## 2018-10-04 DIAGNOSIS — Z471 Aftercare following joint replacement surgery: Secondary | ICD-10-CM | POA: Diagnosis not present

## 2018-10-04 MED FILL — NITROFURANTOIN MCR 50 MG CA: 50 | 60 days supply | Qty: 60 | Fill #3

## 2018-10-05 NOTE — Progress Notes (Addendum)
[facility] [place] [address], [address] [place], [place] [address] [phone] Fax [phone]  Date:  10/06/2018   Name:  Debbie Hayes   DOB:  07/20/1951   MRN:  [id number]  PCP:  Copland, Jessica C, MD    Chief Complaint: Diabetes (4 month follow up)   History of Present Illness:  Debbie Hayes is a 68 y.o. very pleasant female patient who presents with the following:  Here today for periodic follow-up visit.  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 David Wakefield, has 4 adult children.  The second to youngest has Down syndrome, she lives independently but does need some support They have 6 total grandkids now   Can check A1c today, foot exam also due She is actually up to date on her mammo though I do not have record of her most recent screening  She is not able to tolerate statins - would like to maybe try an rx omega 3 like Lovaza.  Is a reasonable idea Lab Results  Component Value Date   HGBA1C 6.9 (H) 04/19/2018   Lab Results  Component Value Date   TSH 0.74 06/07/2018   We went over her current medications: Tylenol as needed Flector patch Emgality monthly-cut down her migraines Levothyroxine 125 Losartan 50 Metformin 1000 twice daily Januvia 100 Metoprolol 50 daily- she cut down to 25 mg recently due to SE, this seems to have helped  Singulair 10 Oxycodone as needed for joint pain Trazodone at bedtime- she is on 250 at bedtime and does well with this  Ambien as needed- uses on rare occasion Maxalt as needed macrobid 50 daily- this is to prevent UTI, wonders if I can refill this instead of her urologist   Wt Readings from Last 3 Encounters:  10/06/18 172 lb (78 kg)  07/05/18 179 lb (81.2 kg)  06/07/18 179 lb (81.2 kg)   She has lost about 7 lbs on purpose -is pleased.   Would like to check her TSH due to weight change She sees Dr. Balan for her thyroid  She also notes nasal congestion which has waxed and waned for about 6 weeks, she has had a couple of cold type illnesses.  She continues to have copious nasal discharge and discomfort.  Wonders if we might try a course of amoxicillin which seems reasonable  She is also concerned about some roughness of her skin at the upper forehead and on her scalp.  She does not have a dermatologist, would be interested in complete skin check Patient Active Problem List   Diagnosis Date Noted  . Chronic migraine with aura 12/15/2017  . Diabetes mellitus due to underlying condition with complication (HCC) 12/15/2017  . 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 Arthralgia 12/15/2017  . OSA (obstructive sleep apnea) 06/26/2017  . Snoring 03/25/2016  . Palpitations 03/25/2016  . Sleep related headaches 03/25/2016  . Trigeminal anesthesia 11/26/2015  . Spondylolisthesis of lumbar region 08/09/2014  . Back pain 02/17/2014  . Macular degeneration 02/17/2014  . Other and unspecified hyperlipidemia 10/14/2013  . Onychomycosis 12/01/2012  . Arthritis 04/12/2012  . Hypothyroid 04/12/2012    Past Medical History:  Diagnosis Date  . Allergy   . Cancer (HCC)    thyroid cancer  . Cataract   . Chronic migraine BOTOX INJECTION EVERY 3 MONTHS  .   Chronic pain in right shoulder   . Constipation   . Diabetes mellitus   . Disorder of inner ear CAUSES VERTIGO OCCASIONALLY  . Fibromyalgia   . GERD (gastroesophageal reflux disease)   . Hemorrhoid   . History of bladder infections   . History of thyroid cancer 2009  S/P TOTAL THYROIDECTOMY AND RADIATION  . Hyperlipidemia   . Hypertension CARDIOLOGIST- DR Wynonia Lawman- WILL REQUEST LATE NOTE   DENIES S & S  . Hypothyroidism   . Insomnia   . Left shoulder pain   . Macular degeneration   . MRSA infection    nasal treated more than 15 years  ago  . Normal nuclear stress test 01-25-2009  . OA (osteoarthritis) JOINTS  . Osteopenia   . Painful orthopaedic hardware ([place])    left foot  . PONV (postoperative nausea and vomiting) SEVERE  . Rotator cuff disorder LEFT SHOULDER RTC IMPINGMENT  . Sleep apnea    mouth guard, no cpap   . Sleep apnea in adult 06/26/2017  . Spondylolisthesis of lumbar region   . 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 Wears glasses     Past Surgical History:  Procedure Laterality Date  . BACK SURGERY     Dr Arnoldo Morale- spondylosis  . BILATERAL CARPAL TUNNEL RELEASE  1994  . BILATERAL ELBOW SURG.  1999  . BILATERAL SALPINGOOPHECTOMY  1993   POST-OP URETER REPAIR 12 DAYS AFTER   . COLONOSCOPY    . FOOT ARTHRODESIS Left 07/10/2016   Procedure: LEFT 2ND TARSAL METATARSAL ARTHRODESIS  GASTROC RECESSION LEFT LAPIDUS MODIFIED MCBRIDE BUIONECTOMY;  Surgeon: Wylene Simmer, MD;  Location: [facility];  Service: Orthopedics;  Laterality: Left;  Marland Kitchen GASTROC RECESSION EXTREMITY Left 07/10/2016   Procedure: GASTROC RECESSION EXTREMITY;  Surgeon: Wylene Simmer, MD;  Location: [facility];  Service: Orthopedics;  Laterality: Left;  . HARDWARE REMOVAL Left 05/20/2018   Procedure: Left foot removal of deep implants;  Surgeon: Wylene Simmer, MD;  Location: [facility];  Service: Orthopedics;  Laterality: Left;  34mn  . LEFT THUMB JOINT REPLACEMENT  2005   RIGHT DONE IN 2004  . OOPHORECTOMY    . POLYPECTOMY    . RIGHT KNEE ARTHROSCOPY  X2  BEFORE 2011  . RIGHT KNEE ARTHROSCOPY/ PARTIAL LATERAL MENISECTOMY/ TRICOMPARTMENT CHONDROPLASTY/ DECOMPRESSION CYST  10-26-2009  . RIGHT KNEE CLOSED MANIPULATION  09-11-2010  . RIGHT SHOULDER ARTHROSCOPY  2010  &  2004  . TIBIA CYST REMOVED AND ORIF LEG FX  1958  . TONSILLECTOMY  1968  . TOTAL KNEE ARTHROPLASTY  07-16-2010   RIGHT  . TOTAL THYROIDECTOMY  2009   CANCER  (POST-OP BLEED)  AND RADIATION [place]  . trigger fingers Right 2013   3rd  and 4th fingers  . UPPER GASTROINTESTINAL ENDOSCOPY    . VAGINAL HYSTERECTOMY  1990    Social History   Tobacco Use  . Smoking status: Never Smoker  . Smokeless tobacco: Never Used  Substance Use Topics  . Alcohol use: Yes    Comment: RARE  . Drug use: No    Family History  Problem Relation Age of Onset  . Colon polyps Sister 564 . Alzheimer's disease Mother   . Heart disease Father   . Cirrhosis Father   . Heart attack Father   . Macular degeneration Father   . Colon cancer Neg Hx     Allergies  Allergen Reactions  . Ciprofloxacin Diarrhea    Neurological effect   .  Sulfa Antibiotics Hives    Medication list has been reviewed and updated.  Current Outpatient Medications on File Prior to Visit  Medication Sig Dispense Refill  . acetaminophen (TYLENOL) 500 MG tablet Take 500 mg by mouth every 6 (six) hours as needed.    . benzonatate (TESSALON) 200 MG capsule Take 1 capsule (200 mg total) by mouth 2 (two) times daily as needed for cough. 90 capsule 0  . blood glucose meter kit and supplies KIT Test blood sugar once daily. Dx code: E11.9 1 each 0  . Blood Glucose Monitoring Suppl (FREESTYLE LITE) DEVI Check blood sugar no more than twice daily 1 each 0  . cetirizine (ZYRTEC) 10 MG tablet Take 10 mg by mouth daily as needed for allergies.     Marland Kitchen diclofenac (FLECTOR) 1.3 % PTCH PLACE 1 PATCH ONTO THE SKIN 2 TIMES DAILY. 180 patch PRN  . esomeprazole (NEXIUM) 40 MG capsule Take 40 mg by mouth daily at 12 noon.    . Galcanezumab-gnlm (EMGALITY) 120 MG/ML SOAJ Inject 1 pen into the skin every 30 (thirty) days. 1 pen 11  . glucose blood (FREESTYLE LITE) test strip Check blood sugars no more than twice daily 100 each 12  . Lancets (FREESTYLE) lancets Check blood sugars no more than twice daily 100 each 12  . levothyroxine (SYNTHROID, LEVOTHROID) 125 MCG tablet Take 1 tablet (125 mcg total) by mouth daily before breakfast. 90 tablet 3  . losartan (COZAAR) 50 MG tablet TAKE 1  TABLET BY MOUTH DAILY. 90 tablet 3  . metaxalone (SKELAXIN) 800 MG tablet TAKE 1 TABLET BY MOUTH 3 TIMES DAILY. 90 tablet 3  . metFORMIN (GLUCOPHAGE) 1000 MG tablet TAKE 1 TABLET BY MOUTH 2 TIMES DAILY WITH A MEAL. 180 tablet 1  . minoxidil (ROGAINE) 2 % external solution Apply topically 2 (two) times daily.    . montelukast (SINGULAIR) 10 MG tablet TAKE 1 TABLET BY MOUTH AT BEDTIME. 90 tablet 1  . Multiple Vitamins-Minerals (ICAPS AREDS 2) CAPS Take by mouth.    . naproxen sodium (ALEVE) 220 MG tablet Take 440 mg by mouth.    Marland Kitchen NASONEX 50 MCG/ACT nasal spray PLACE 2 SPRAYS INTO THE NOSE DAILY. 17 g 9  . OnabotulinumtoxinA (BOTOX IJ) Inject as directed.    . ondansetron (ZOFRAN) 4 MG tablet TAKE 1 TABLET BY MOUTH EVERY 8 HOURS AS NEEDED FOR NAUSEA AND VOMITING 30 tablet 1  . oxyCODONE-acetaminophen (PERCOCET) 7.5-325 MG tablet Take 1 tablet by mouth every 8 (eight) hours as needed for severe pain. 30 tablet 0  . rizatriptan (MAXALT) 10 MG tablet TAKE 1 TABLET BY MOUTH AS NEEDED. MAY REPEAT IN 2 HOURS IF NEEDED 10 tablet 5  . sitaGLIPtin (JANUVIA) 100 MG tablet Take 1 tablet (100 mg total) by mouth daily. 90 tablet 3  . traZODone (DESYREL) 100 MG tablet Take 2.5 tablets (250 mg total) by mouth at bedtime. 225 tablet 3  . zolpidem (AMBIEN) 5 MG tablet TAKE 1/2 TABLET BY MOUTH AT BEDTIME AS NEEDED FOR SLEEP 30 tablet 1  . SUMAtriptan (IMITREX) 100 MG tablet Take 1 tablet (100 mg total) by mouth once for 1 dose. May repeat in 2 hours if headache persists or recurs. 10 tablet 5   No current facility-administered medications on file prior to visit.     Review of Systems:  As per HPI- otherwise negative.   Physical Examination: Vitals:   10/06/18 1113  BP: 110/68  Pulse: 90  Resp: 16  SpO2: 98%  Vitals:   10/06/18 1113  Weight: 172 lb (78 kg)  Height: 5' 8" (1.727 m)   Body mass index is 26.15 kg/m. Ideal Body Weight: Weight in (lb) to have BMI = 25: 164.1  GEN: WDWN, NAD,  Non-toxic, A & O x 3, mild overweight, looks well HEENT: Atraumatic, Normocephalic. Neck supple. No masses, No LAD.  .ne Ears and Nose: No external deformity. CV: RRR, No M/G/R. No JVD. No thrill. No extra heart sounds. PULM: CTA B, no wheezes, crackles, rhonchi. No retractions. No resp. distress. No accessory muscle use. ABD: S, NT, ND, +BS. No rebound. No HSM. EXTR: No c/c/e NEURO Normal gait.  PSYCH: Normally interactive. Conversant. Not depressed or anxious appearing.  Calm demeanor.  Foot exam today  She has what appears to be Seb Ks on her face, and possibly actinic keratoses on her scalp   Assessment and Plan: Diabetes mellitus without complication (HCC) - Plan: Hemoglobin A1c  Chronic pain syndrome  Essential hypertension - Plan: metoprolol succinate (TOPROL-XL) 25 MG 24 hr tablet  Postoperative hypothyroidism - Plan: TSH  Acute non-recurrent frontal sinusitis - Plan: amoxicillin (AMOXIL) 500 MG capsule  Chronic UTI - Plan: nitrofurantoin (MACRODANTIN) 50 MG capsule  Dyslipidemia - Plan: omega-3 acid ethyl esters (LOVAZA) 1 g capsule  Concern about skin cancer without diagnosis - Plan: Ambulatory referral to Dermatology  Referral to dermatology for complete skin check A1c pending Blood pressure under good control, will decrease metoprolol to 25 Refilled chronic Macrodantin We will add Lovaza to her current regimen for dyslipidemia. Try course of amoxicillin for nasal congestion,  Signed Jessica Copland, MD  addnd- received her labs, message to pt  Results for orders placed or performed in visit on 10/06/18  Hemoglobin A1c  Result Value Ref Range   Hgb A1c MFr Bld 6.5 4.6 - 6.5 %  TSH  Result Value Ref Range   TSH 0.46 0.35 - 4.50 uIU/mL     

## 2018-10-06 ENCOUNTER — Other Ambulatory Visit: Payer: Self-pay

## 2018-10-06 ENCOUNTER — Ambulatory Visit: Payer: 59 | Admitting: Family Medicine

## 2018-10-06 ENCOUNTER — Encounter: Payer: Self-pay | Admitting: Family Medicine

## 2018-10-06 VITALS — BP 110/68 | HR 90 | Resp 16 | Ht 68.0 in | Wt 172.0 lb

## 2018-10-06 DIAGNOSIS — I10 Essential (primary) hypertension: Secondary | ICD-10-CM | POA: Diagnosis not present

## 2018-10-06 DIAGNOSIS — E890 Postprocedural hypothyroidism: Secondary | ICD-10-CM | POA: Diagnosis not present

## 2018-10-06 DIAGNOSIS — J0110 Acute frontal sinusitis, unspecified: Secondary | ICD-10-CM | POA: Diagnosis not present

## 2018-10-06 DIAGNOSIS — N390 Urinary tract infection, site not specified: Secondary | ICD-10-CM

## 2018-10-06 DIAGNOSIS — Z711 Person with feared health complaint in whom no diagnosis is made: Secondary | ICD-10-CM | POA: Diagnosis not present

## 2018-10-06 DIAGNOSIS — E119 Type 2 diabetes mellitus without complications: Secondary | ICD-10-CM | POA: Diagnosis not present

## 2018-10-06 DIAGNOSIS — E785 Hyperlipidemia, unspecified: Secondary | ICD-10-CM

## 2018-10-06 DIAGNOSIS — H2511 Age-related nuclear cataract, right eye: Secondary | ICD-10-CM | POA: Diagnosis not present

## 2018-10-06 DIAGNOSIS — G894 Chronic pain syndrome: Secondary | ICD-10-CM

## 2018-10-06 DIAGNOSIS — H25011 Cortical age-related cataract, right eye: Secondary | ICD-10-CM | POA: Diagnosis not present

## 2018-10-06 LAB — TSH: TSH: 0.46 u[IU]/mL (ref 0.35–4.50)

## 2018-10-06 LAB — HEMOGLOBIN A1C: Hgb A1c MFr Bld: 6.5 % (ref 4.6–6.5)

## 2018-10-06 MED ORDER — OMEGA-3-ACID ETHYL ESTERS 1 G PO CAPS: 2.0000 g | ORAL_CAPSULE | Freq: Two times a day (BID) | ORAL | 3 refills | Status: DC

## 2018-10-06 MED ORDER — NITROFURANTOIN MACROCRYSTAL 50 MG PO CAPS: 50.0000 mg | ORAL_CAPSULE | Freq: Every day | ORAL | 3 refills | Status: DC

## 2018-10-06 MED ORDER — METOPROLOL SUCCINATE ER 25 MG PO TB24: 25.0000 mg | ORAL_TABLET | Freq: Every day | ORAL | 3 refills | Status: DC

## 2018-10-06 MED ORDER — AMOXICILLIN 500 MG PO CAPS: 1000.0000 mg | ORAL_CAPSULE | Freq: Two times a day (BID) | ORAL | 0 refills | Status: DC

## 2018-10-06 MED FILL — OMEGA-3 ETHYL ESTERS 1 GM C: 1 | 90 days supply | Qty: 360 | Fill #0

## 2018-10-06 MED FILL — AMOXICILLIN 500 MG CAPSULE: 500 | 10 days supply | Qty: 40 | Fill #0

## 2018-10-06 MED FILL — METOPROLOL SUCCINATE ER 25: 25 | 90 days supply | Qty: 90 | Fill #0

## 2018-10-06 MED FILL — KETOROLAC 0.5% OPHTH SOLUTI: 0.5 | 25 days supply | Qty: 5 | Fill #0

## 2018-10-06 MED FILL — PREDNISOLONE AC 1% EYE DROP: 1 | 25 days supply | Qty: 5 | Fill #0

## 2018-10-06 MED FILL — OFLOXACIN 0.3% EYE DROPS: 0.3 | 25 days supply | Qty: 10 | Fill #0

## 2018-10-06 NOTE — Patient Instructions (Signed)
Good to see you today-  I will be in touch with your results  We will try lovaza for your lipids- build up to 2 pills BID gradually We can repeat a lipid panel in about 6 months  Also we can try a course of amoxicillin for a possible sinus infection

## 2018-10-08 DIAGNOSIS — M4316 Spondylolisthesis, lumbar region: Secondary | ICD-10-CM | POA: Diagnosis not present

## 2018-10-08 DIAGNOSIS — M5136 Other intervertebral disc degeneration, lumbar region: Secondary | ICD-10-CM | POA: Diagnosis not present

## 2018-10-08 MED FILL — EMGALITY 120 MG/ML SOAJ: 120 | 30 days supply | Qty: 1 | Fill #8

## 2018-10-08 MED FILL — LOSARTAN POTASSIUM 50 MG TA: 50 | 30 days supply | Qty: 30 | Fill #7

## 2018-10-25 ENCOUNTER — Other Ambulatory Visit: Payer: Self-pay | Admitting: Family Medicine

## 2018-10-26 MED FILL — FREESTYLE LITE TEST STRIP: 50 days supply | Qty: 100 | Fill #0

## 2018-11-02 ENCOUNTER — Other Ambulatory Visit: Payer: Self-pay | Admitting: Neurology

## 2018-11-02 ENCOUNTER — Other Ambulatory Visit: Payer: Self-pay | Admitting: Family Medicine

## 2018-11-02 MED FILL — LEVOTHYROXINE 125 MCG TABLE: 125 | 90 days supply | Qty: 90 | Fill #3

## 2018-11-02 MED FILL — RIZATRIPTAN BENZOATE 10 MG: 10 | 30 days supply | Qty: 10 | Fill #1

## 2018-11-03 ENCOUNTER — Telehealth: Payer: Self-pay | Admitting: Neurology

## 2018-11-03 MED FILL — EMGALITY 120 MG/ML SOAJ: 120 | 30 days supply | Qty: 1 | Fill #0

## 2018-11-03 MED FILL — LOSARTAN POTASSIUM 50 MG TA: 50 | 30 days supply | Qty: 30 | Fill #0

## 2018-11-03 NOTE — Telephone Encounter (Signed)
That is fine  thanks

## 2018-11-03 NOTE — Telephone Encounter (Signed)
I called to speak with the patient regarding her Botox injections and the COVID-19 protocol. I explained that our office feels the safest measure right now is holding off on the Botox apt's unless the patient feels it is urgent. She feels that it is important to keep her apt and come in. She would like to keep her apt.

## 2018-11-03 NOTE — Telephone Encounter (Signed)
Noted, thank you. DW  °

## 2018-11-05 ENCOUNTER — Ambulatory Visit: Payer: Medicare Other | Admitting: Neurology

## 2018-11-10 ENCOUNTER — Telehealth: Payer: Self-pay | Admitting: *Deleted

## 2018-11-10 ENCOUNTER — Ambulatory Visit (INDEPENDENT_AMBULATORY_CARE_PROVIDER_SITE_OTHER): Payer: 59 | Admitting: Neurology

## 2018-11-10 ENCOUNTER — Other Ambulatory Visit: Payer: Self-pay

## 2018-11-10 ENCOUNTER — Telehealth: Payer: Self-pay | Admitting: Neurology

## 2018-11-10 VITALS — Temp 98.6°F

## 2018-11-10 DIAGNOSIS — M542 Cervicalgia: Secondary | ICD-10-CM | POA: Diagnosis not present

## 2018-11-10 DIAGNOSIS — G43711 Chronic migraine without aura, intractable, with status migrainosus: Secondary | ICD-10-CM | POA: Diagnosis not present

## 2018-11-10 DIAGNOSIS — R51 Headache: Secondary | ICD-10-CM

## 2018-11-10 DIAGNOSIS — R519 Headache, unspecified: Secondary | ICD-10-CM

## 2018-11-10 DIAGNOSIS — R0683 Snoring: Secondary | ICD-10-CM | POA: Diagnosis not present

## 2018-11-10 DIAGNOSIS — G8929 Other chronic pain: Secondary | ICD-10-CM | POA: Diagnosis not present

## 2018-11-10 MED ORDER — UBROGEPANT 50 MG PO TABS: 50.0000 mg | ORAL_TABLET | ORAL | 6 refills | Status: DC | PRN

## 2018-11-10 MED ORDER — TOPIRAMATE ER 25 MG PO SPRINKLE CAP24: EXTENDED_RELEASE_CAPSULE | ORAL | 0 refills | Status: DC

## 2018-11-10 MED ORDER — TOPIRAMATE ER 100 MG PO SPRINKLE CAP24: 100.0000 mg | EXTENDED_RELEASE_CAPSULE | Freq: Every day | ORAL | 11 refills | Status: DC

## 2018-11-10 NOTE — Progress Notes (Signed)
Botox- 100 units x 2 vials Lot: [id number] Expiration: 03/2021 NDC: 5825-1898-42  Bacteriostatic 0.9% Sodium Chloride- 12mL total Lot: [id number] Expiration: 04/28/2019 NDC: 1188-6773-73  Dx: G68.159 B/B

## 2018-11-10 NOTE — Telephone Encounter (Signed)
Order form completed and signed for new migraine infusion, Vyepti 300 mg IV every 3 months. Insurance information given to infusion RN with order, MRI/MRA and office notes.

## 2018-11-10 NOTE — Patient Instructions (Addendum)
Referral to headache center Start Vyepti - starting approval process Start Qudexy Topiramate XR as preventative Ubrelvy for acute management  Eptinezumab: Patient drug information Access Lex[employer] Online here. Copyright [id number] [employer] All rights reserved. (For additional information see "Eptinezumab: Drug information") Brand Names: [place]  Vyepti  What is this drug used for?   It is used to prevent migraine headaches.  What do I need to tell my doctor BEFORE I take this drug?   If you are allergic to this drug; any part of this drug; or any other drugs, foods, or substances. Tell your doctor about the allergy and what signs you had.   This drug may interact with other drugs or health problems.   Tell your doctor and pharmacist about all of your drugs (prescription or OTC, natural products, vitamins) and health problems. You must check to make sure that it is safe for you to take this drug with all of your drugs and health problems. Do not start, stop, or change the dose of any drug without checking with your doctor.  What are some things I need to know or do while I take this drug?   Tell all of your health care providers that you take this drug. This includes your doctors, nurses, pharmacists, and dentists.   Tell your doctor if you are pregnant, plan on getting pregnant, or are breast-feeding. You will need to talk about the benefits and risks to you and the baby.  What are some side effects that I need to call my doctor about right away?   WARNING/CAUTION: Even though it may be rare, some people may have very bad and sometimes deadly side effects when taking a drug. Tell your doctor or get medical help right away if you have any of the following signs or symptoms that may be related to a very bad side effect:   Signs of an allergic reaction, like rash; hives; itching; red, swollen, blistered, or peeling skin with or without fever; wheezing; tightness in the chest or throat;  trouble breathing, swallowing, or talking; unusual hoarseness; or swelling of the mouth, face, lips, tongue, or throat.   Flushing.  What are some other side effects of this drug?   All drugs may cause side effects. However, many people have no side effects or only have minor side effects. Call your doctor or get medical help if any of these side effects or any other side effects bother you or do not go away:   Nose or throat irritation.   These are not all of the side effects that may occur. If you have questions about side effects, call your doctor. Call your doctor for medical advice about side effects.   You may report side effects to your national health agency.  How is this drug best taken?   Use this drug as ordered by your doctor. Read all information given to you. Follow all instructions closely.   It is given as an infusion into a vein over a period of time.  What do I do if I miss a dose?   Call your doctor to find out what to do.  How do I store and/or throw out this drug?   If you need to store this drug at home, talk with your doctor, nurse, or pharmacist about how to store it.  General drug facts   If your symptoms or health problems do not get better or if they become worse, call your doctor.  Do not share your drugs with others and do not take anyone else's drugs.   Keep all drugs in a safe place. Keep all drugs out of the reach of children and pets.   Throw away unused or expired drugs. Do not flush down a toilet or pour down a drain unless you are told to do so. Check with your pharmacist if you have questions about the best way to throw out drugs. There may be drug take-back programs in your area.   Some drugs may have another patient information leaflet. If you have any questions about this drug, please talk with your doctor, nurse, pharmacist, or other health care provider.   If you think there has been an overdose, call your poison control center or get medical care  right away. Be ready to tell or show what was taken, how much, and when it happened.  Use of UpToDate is subject to the Subscription and License Agreement. Topic 760 006 [id number] Version 2.0  Ubrogepant: Patient drug information Access Lexicomp Online here. Copyright [phone] [employer]. All rights reserved. (For additional information see "Ubrogepant: Drug information") Brand Names: [place]  Roselyn Meier  What is this drug used for?   It is used to treat migraine headaches.  What do I need to tell my doctor BEFORE I take this drug?   If you are allergic to this drug; any part of this drug; or any other drugs, foods, or substances. Tell your doctor about the allergy and what signs you had.   If you have kidney disease.   If you take any drugs (prescription or OTC, natural products, vitamins) that must not be taken with this drug, like certain drugs that are used for HIV, infections, or seizures. There are many drugs that must not be taken with this drug.   This is not a list of all drugs or health problems that interact with this drug.   Tell your doctor and pharmacist about all of your drugs (prescription or OTC, natural products, vitamins) and health problems. You must check to make sure that it is safe for you to take this drug with all of your drugs and health problems. Do not start, stop, or change the dose of any drug without checking with your doctor.  What are some things I need to know or do while I take this drug?   Tell all of your health care providers that you take this drug. This includes your doctors, nurses, pharmacists, and dentists.   If you drink grapefruit juice or eat grapefruit often, talk with your doctor.   Tell your doctor if you are pregnant, plan on getting pregnant, or are breast-feeding. You will need to talk about the benefits and risks to you and the baby.  What are some side effects that I need to call my doctor about right away?   WARNING/CAUTION: Even though it may be  rare, some people may have very bad and sometimes deadly side effects when taking a drug. Tell your doctor or get medical help right away if you have any of the following signs or symptoms that may be related to a very bad side effect:   Signs of an allergic reaction, like rash; hives; itching; red, swollen, blistered, or peeling skin with or without fever; wheezing; tightness in the chest or throat; trouble breathing, swallowing, or talking; unusual hoarseness; or swelling of the mouth, face, lips, tongue, or throat.  What are some other side effects of this drug?   All  drugs may cause side effects. However, many people have no side effects or only have minor side effects. Call your doctor or get medical help if any of these side effects or any other side effects bother you or do not go away:   Upset stomach.   Feeling sleepy.   These are not all of the side effects that may occur. If you have questions about side effects, call your doctor. Call your doctor for medical advice about side effects.   You may report side effects to your national health agency.  How is this drug best taken?   Use this drug as ordered by your doctor. Read all information given to you. Follow all instructions closely.   Take with or without food.   Take as early as you can after the attack has started.   If needed, another dose may be taken as your doctor has told you. Be sure you know how many hours to wait before taking another dose.  What do I do if I miss a dose?   This drug is taken on an as needed basis. Do not take more often than told by the doctor.  How do I store and/or throw out this drug?   Store at room temperature in a dry place. Do not store in a bathroom.   Keep all drugs in a safe place. Keep all drugs out of the reach of children and pets.   Throw away unused or expired drugs. Do not flush down a toilet or pour down a drain unless you are told to do so. Check with your pharmacist if you have  questions about the best way to throw out drugs. There may be drug take-back programs in your area.  General drug facts   If your symptoms or health problems do not get better or if they become worse, call your doctor.   Do not share your drugs with others and do not take anyone else's drugs.   Some drugs may have another patient information leaflet. If you have any questions about this drug, please talk with your doctor, nurse, pharmacist, or other health care provider.   If you think there has been an overdose, call your poison control center or get medical care right away. Be ready to tell or show what was taken, how much, and when it happened.  Use of UpToDate is subject to the Subscription and License Agreement. Topic [phone] Version 3.0   Topiramate extended-release capsules What is this medicine? TOPIRAMATE (toe PYRE a mate) is used to treat seizures in adults or children with epilepsy. It is also used for the prevention of migraine headaches. This medicine may be used for other purposes; ask your health care provider or pharmacist if you have questions. COMMON BRAND NAME(S): Trokendi XR What should I tell my health care provider before I take this medicine? They need to know if you have any of these conditions: -cirrhosis of the liver or liver disease -diarrhea -glaucoma -kidney stones or kidney disease -lung disease like asthma, obstructive pulmonary disease, emphysema -metabolic acidosis -on a ketogenic diet -scheduled for surgery or a procedure -suicidal thoughts, plans, or attempt; a previous suicide attempt by you or a family member -an unusual or allergic reaction to topiramate, other medicines, foods, dyes, or preservatives -pregnant or trying to get pregnant -breast-feeding How should I use this medicine? Take this medicine by mouth with a glass of water. Follow the directions on the prescription label. Trokendi XR capsules must  be swallowed whole. Do not sprinkle on  food, break, crush, dissolve, or chew. Qudexy XR capsules may be swallowed whole or opened and sprinkled on a small amount of soft food. This mixture must be swallowed immediately. Do not chew or store mixture for later use. You may take this medicine with meals. Take your medicine at regular intervals. Do not take it more often than directed. Talk to your pediatrician regarding the use of this medicine in children. Special care may be needed. While Trokendi XR may be prescribed for children as young as 6 years and Qudexy XR may be prescribed for children as young as 2 years for selected conditions, precautions do apply. Overdosage: If you think you have taken too much of this medicine contact a poison control center or emergency room at once. NOTE: This medicine is only for you. Do not share this medicine with others. What if I miss a dose? If you miss a dose, take it as soon as you can. If it is almost time for your next dose, take only that dose. Do not take double or extra doses. What may interact with this medicine? Do not take this medicine with any of the following medications: -probenecid This medicine may also interact with the following medications: -acetazolamide -alcohol -amitriptyline -birth control pills -digoxin -hydrochlorothiazide -lithium -medicines for pain, sleep, or muscle relaxation -metformin -methazolamide -other seizure or epilepsy medicines -pioglitazone -risperidone This list may not describe all possible interactions. Give your health care provider a list of all the medicines, herbs, non-prescription drugs, or dietary supplements you use. Also tell them if you smoke, drink alcohol, or use illegal drugs. Some items may interact with your medicine. What should I watch for while using this medicine? Visit your doctor or health care professional for regular checks on your progress. Do not stop taking this medicine suddenly. This increases the risk of seizures if you  are using this medicine to control epilepsy. Wear a medical identification bracelet or chain to say you have epilepsy or seizures, and carry a card that lists all your medicines. This medicine can decrease sweating and increase your body temperature. Watch for signs of deceased sweating or fever, especially in children. Avoid extreme heat, hot baths, and saunas. Be careful about exercising, especially in hot weather. Contact your health care provider right away if you notice a fever or decrease in sweating. You should drink plenty of fluids while taking this medicine. If you have had kidney stones in the past, this will help to reduce your chances of forming kidney stones. If you have stomach pain, with nausea or vomiting and yellowing of your eyes or skin, call your doctor immediately. You may get drowsy, dizzy, or have blurred vision. Do not drive, use machinery, or do anything that needs mental alertness until you know how this medicine affects you. To reduce dizziness, do not sit or stand up quickly, especially if you are an older patient. Alcohol can increase drowsiness and dizziness. Avoid alcoholic drinks. Do not drink alcohol for 6 hours before or 6 hours after taking Trokendi XR. If you notice blurred vision, eye pain, or other eye problems, seek medical attention at once for an eye exam. The use of this medicine may increase the chance of suicidal thoughts or actions. Pay special attention to how you are responding while on this medicine. Any worsening of mood, or thoughts of suicide or dying should be reported to your health care professional right away. This medicine may increase the  chance of developing metabolic acidosis. If left untreated, this can cause kidney stones, bone disease, or slowed growth in children. Symptoms include breathing fast, fatigue, loss of appetite, irregular heartbeat, or loss of consciousness. Call your doctor immediately if you experience any of these side effects.  Also, tell your doctor about any surgery you plan on having while taking this medicine since this may increase your risk for metabolic acidosis. Birth control pills may not work properly while you are taking this medicine. Talk to your doctor about using an extra method of birth control. Women who become pregnant while using this medicine may enroll in the [place] Pregnancy Registry by calling [phone]. This registry collects information about the safety of antiepileptic drug use during pregnancy. What side effects may I notice from receiving this medicine? Side effects that you should report to your doctor or health care professional as soon as possible: -allergic reactions like skin rash, itching or hives, swelling of the face, lips, or tongue -decreased sweating and/or rise in body temperature -depression -difficulty breathing, fast or irregular breathing patterns -difficulty speaking -difficulty walking or controlling muscle movements -hearing impairment -redness, blistering, peeling or loosening of the skin, including inside the mouth -tingling, pain or numbness in the hands or feet -unusually weak or tired -worsening of mood, thoughts or actions of suicide or dying Side effects that usually do not require medical attention (report to your doctor or health care professional if they continue or are bothersome): -altered taste -back pain, joint or muscle aches and pains -diarrhea, or constipation -headache -loss of appetite -nausea -stomach upset, indigestion -tremors This list may not describe all possible side effects. Call your doctor for medical advice about side effects. You may report side effects to FDA at 1-800-FDA-1088. Where should I keep my medicine? Keep out of the reach of children. Store at room temperature between 15 and 30 degrees C (59 and 86 degrees F) in a tightly closed container. Protect from moisture. Throw away any unused medicine  after the expiration date. NOTE: This sheet is a summary. It may not cover all possible information. If you have questions about this medicine, talk to your doctor, pharmacist, or health care provider.  2019 Elsevier/Gold Standard (2015-11-02 12:33:11)

## 2018-11-10 NOTE — Progress Notes (Signed)
Patient is not doing well. She is having 15-18 migraine days a month. We will stop botox. She has failed all three CGRPs. She has chronic pain and takes oxycodone sparingly. She has chronic neck pain and take skelaxone. She has sleep-related headaches and snoring and has followed with Dr. Westley Hummer, our sleep specialist and had sleep study.  failed Emgality/Ajovy/Aimovig She would like a referral to [facility] headache center Start Vyepti for preventative - have started approval proess Start Topiramate XR - she does not remember what happened when she tried Topiramate it was years ago will try again [place] for acute management Start topiramate ER  NO medication overuse Has had sleep evaluation/study MRI brain normal Continue to follow with Dr. Brett Fairy for sleep Continue Skelaxone for neck pain  Migraine preventative meds tried and failed: Botox 5 injections (also tried this in the past), Emgality/Ajovy/Aimovig, Did not tolerate Topiramate IR in the past will try ER, metoprolol, atenolol, depakote, effexor, and multiple other first, second and third line preventatives and acute medications over the many years of migraines, has been to the [place] in the past and other physicians/neurologists. She has not been on Depakote or Zonegran, unsure of the TCAs.  Meds ordered this encounter  Medications  . topiramate ER (QUDEXY XR) 25 MG CS24 sprinkle cap    Sig: Start with 25mg  for one week. Increase by one pill weekly until at 100mg  at bedtime.    Dispense:  180 each    Refill:  0  . topiramate ER (QUDEXY XR) 100 MG CS24 sprinkle capsule    Sig: Take 1 capsule (100 mg total) by mouth at bedtime.    Dispense:  30 each    Refill:  11    Patient has copay card; she can have medication regardless of insurance approval or copay amount.  Marland Kitchen Ubrogepant (UBRELVY) 50 MG TABS    Sig: Take 50 mg by mouth every 2 (two) hours as needed. Max 200mg  a day    Dispense:  10 tablet    Refill:  6   Patient has copay card; she can have medication for $5 regardless of insurance approval or copay amount.   Orders Placed This Encounter  Procedures  . Ambulatory referral to Neurology   A total of 25 minutes was spent face-to-face with this patient. Over half this time was spent on counseling patient on the  1. Chronic migraine without aura, with intractable migraine, so stated, with status migrainosus   2. Chronic neck pain   3. Sleep related headaches   4. Snoring    diagnosis and different diagnostic and therapeutic options, counseling and coordination of care, risks ans benefits of management, compliance, or risk factor reduction and education.  This does not inclde time spent on botox procedure.  Consent Form Botulism Toxin Injection For Chronic Migraine    Reviewed orally with patient, additionally signature is on file:  Botulism toxin has been approved by the Federal drug administration for treatment of chronic migraine. Botulism toxin does not cure chronic migraine and it may not be effective in some patients.  The administration of botulism toxin is accomplished by injecting a small amount of toxin into the muscles of the neck and head. Dosage must be titrated for each individual. Any benefits resulting from botulism toxin tend to wear off after 3 months with a repeat injection required if benefit is to be maintained. Injections are usually done every 3-4 months with maximum effect peak achieved by about 2 or  3 weeks. Botulism toxin is expensive and you should be sure of what costs you will incur resulting from the injection.  The side effects of botulism toxin use for chronic migraine may include:   -Transient, and usually mild, facial weakness with facial injections  -Transient, and usually mild, head or neck weakness with head/neck injections  -Reduction or loss of forehead facial animation due to forehead muscle weakness  -Eyelid drooping  -Dry eye  -Pain at the site of  injection or bruising at the site of injection  -Double vision  -Potential unknown long term risks  Contraindications: You should not have Botox if you are pregnant, nursing, allergic to albumin, have an infection, skin condition, or muscle weakness at the site of the injection, or have myasthenia gravis, Lambert-Eaton syndrome, or ALS.  It is also possible that as with any injection, there may be an allergic reaction or no effect from the medication. Reduced effectiveness after repeated injections is sometimes seen and rarely infection at the injection site may occur. All care will be taken to prevent these side effects. If therapy is given over a long time, atrophy and wasting in the muscle injected may occur. Occasionally the patient's become refractory to treatment because they develop antibodies to the toxin. In this event, therapy needs to be modified.  I have read the above information and consent to the administration of botulism toxin.    BOTOX PROCEDURE NOTE FOR MIGRAINE HEADACHE    Contraindications and precautions discussed with patient(above). Aseptic procedure was observed and patient tolerated procedure. Procedure performed by Dr. Georgia Dom  The condition has existed for more than 6 months, and pt does not have a diagnosis of ALS, Myasthenia Gravis or Lambert-Eaton Syndrome.  Risks and benefits of injections discussed and pt agrees to proceed with the procedure.  Written consent obtained  These injections are medically necessary. Pt  receives good benefits from these injections. These injections do not cause sedations or hallucinations which the oral therapies may cause.  Indication/Diagnosis: chronic migraine BOTOX(J0585) injection was performed according to protocol by [employer]. 200 units of BOTOX was dissolved into 4 cc NS.   NDC: [address]-54   Description of procedure:  The patient was placed in a sitting position. The standard protocol was used for Botox as  follows, with 5 units of Botox injected at each site:   -Procerus muscle, midline injection  -Corrugator muscle, bilateral injection  -Frontalis muscle, bilateral injection, with 2 sites each side, medial injection was performed in the upper one third of the frontalis muscle, in the region vertical from the medial inferior edge of the superior orbital rim. The lateral injection was again in the upper one third of the forehead vertically above the lateral limbus of the cornea, 1.5 cm lateral to the medial injection site.  -Temporalis muscle injection, 4 sites, bilaterally. The first injection was 3 cm above the tragus of the ear, second injection site was 1.5 cm to 3 cm up from the first injection site in line with the tragus of the ear. The third injection site was 1.5-3 cm forward between the first 2 injection sites. The fourth injection site was 1.5 cm posterior to the second injection site.  -Occipitalis muscle injection, 3 sites, bilaterally. The first injection was done one half way between the occipital protuberance and the tip of the mastoid process behind the ear. The second injection site was done lateral and superior to the first, 1 fingerbreadth from the first injection. The third injection site  was 1 fingerbreadth superiorly and medially from the first injection site.  -Cervical paraspinal muscle injection, 2 sites, bilateral knee first injection site was 1 cm from the midline of the cervical spine, 3 cm inferior to the lower border of the occipital protuberance. The second injection site was 1.5 cm superiorly and laterally to the first injection site.  -Trapezius muscle injection was performed at 3 sites, bilaterally. The first injection site was in the upper trapezius muscle halfway between the inflection point of the neck, and the acromion. The second injection site was one half way between the acromion and the first injection site. The third injection was done between the first  injection site and the inflection point of the neck.   Will return for repeat injection in 3 months.   A 200 unit sof Botox was used, 155 units were injected, the rest of the Botox was wasted. The patient tolerated the procedure well, there were no complications of the above procedure.

## 2018-11-10 NOTE — Telephone Encounter (Signed)
Botox is not helping we will discontinue

## 2018-11-10 NOTE — Telephone Encounter (Signed)
Noted, thank you. DW  °

## 2018-11-11 ENCOUNTER — Telehealth: Payer: Self-pay

## 2018-11-11 NOTE — Telephone Encounter (Signed)
Pending approval for Ubrelvy mg tablets Key: [id number] ICD 10 codes: X41.287 and G43.109   I will update once a decision has been made.

## 2018-11-11 NOTE — Telephone Encounter (Signed)
Spoke with [employer] RN, Joslyn Devon. The company is working on the ordering/infusion process and will be in touch. Order is on hold currently. Dr. Jaynee Eagles aware.

## 2018-11-12 ENCOUNTER — Encounter: Payer: Self-pay | Admitting: Family Medicine

## 2018-11-15 ENCOUNTER — Encounter: Payer: Self-pay | Admitting: *Deleted

## 2018-11-15 MED FILL — UBRELVY 50 MG TABS: 50 | 30 days supply | Qty: 10 | Fill #0

## 2018-11-15 NOTE — Telephone Encounter (Signed)
We received the [facility] approval letter. I faxed it to [facility]. Received a receipt of confirmation.  Pt emailed with update.

## 2018-11-15 NOTE — Telephone Encounter (Signed)
The request has been approved. The authorization is effective for a maximum of 6 fills from 11/13/2018 to 05/14/2019, as long as the member is enrolled in their current health plan. The request was approved as submitted. This request is approved for 16 tablets per 30 days. A written notification letter will follow with additional details.    I will send the approval letter to the pharmacy once I receive one.

## 2018-11-16 MED ORDER — DAPAGLIFLOZIN PROPANEDIOL 5 MG PO TABS: 5.0000 mg | ORAL_TABLET | Freq: Every day | ORAL | 6 refills | Status: DC

## 2018-11-16 MED FILL — FARXIGA 5 MG TABLET: 5 | 30 days supply | Qty: 30 | Fill #0

## 2018-11-16 NOTE — Addendum Note (Signed)
Addended by: Lamar Blinks C on: 11/16/2018 10:01 AM   Modules accepted: Orders

## 2018-11-19 ENCOUNTER — Other Ambulatory Visit: Payer: Self-pay | Admitting: Family Medicine

## 2018-11-19 MED FILL — traZODone HCL 100 MG TABS: 100 | 90 days supply | Qty: 225 | Fill #3

## 2018-11-19 MED FILL — LOSARTAN POTASSIUM 50 MG TA: 50 | 30 days supply | Qty: 30 | Fill #1

## 2018-11-19 MED FILL — ZOLPIDEM TARTRATE 5 MG TAB: 5 | 30 days supply | Qty: 30 | Fill #0

## 2018-11-19 MED FILL — JANUVIA 100 MG TABLET: 100 | 60 days supply | Qty: 60 | Fill #4

## 2018-11-19 MED FILL — NITROFURANTOIN MACROCRYSTAL: 50 | 60 days supply | Qty: 60 | Fill #4

## 2018-11-22 MED ORDER — CICLOPIROX 8 % EX SOLN: Freq: Every day | CUTANEOUS | 0 refills | Status: DC

## 2018-11-22 MED FILL — CICLOPIROX 8% SOLUTION: 8 | 25 days supply | Qty: 7 | Fill #0

## 2018-11-22 NOTE — Addendum Note (Signed)
Addended by: Lamar Blinks C on: 11/22/2018 06:07 AM   Modules accepted: Orders

## 2018-11-24 ENCOUNTER — Ambulatory Visit: Payer: Medicare Other | Admitting: Adult Health

## 2018-11-29 ENCOUNTER — Encounter: Payer: Self-pay | Admitting: Family Medicine

## 2018-11-29 DIAGNOSIS — E119 Type 2 diabetes mellitus without complications: Secondary | ICD-10-CM

## 2018-11-29 MED ORDER — DAPAGLIFLOZIN PROPANEDIOL 10 MG PO TABS: 10.0000 mg | ORAL_TABLET | Freq: Every day | ORAL | 3 refills | Status: DC

## 2018-11-29 MED FILL — FARXIGA 10 MG TABLET: 10 | 90 days supply | Qty: 90 | Fill #0

## 2018-12-06 MED FILL — RIZATRIPTAN BENZOATE 10 MG: 10 | 30 days supply | Qty: 10 | Fill #2

## 2018-12-06 MED FILL — METAXALONE 800 MG TABLET: 800 | 30 days supply | Qty: 90 | Fill #1

## 2018-12-09 MED FILL — UBRELVY 50 MG TABS: 50 | 30 days supply | Qty: 10 | Fill #1

## 2018-12-10 MED FILL — FARXIGA 5 MG TABLET: 5 | 30 days supply | Qty: 30 | Fill #1

## 2018-12-11 ENCOUNTER — Other Ambulatory Visit: Payer: Self-pay | Admitting: Family Medicine

## 2018-12-13 MED FILL — LOSARTAN POTASSIUM 50 MG TA: 50 | 30 days supply | Qty: 30 | Fill #2

## 2018-12-13 MED FILL — CICLOPIROX 8% SOLUTION: 8 | 25 days supply | Qty: 7 | Fill #0

## 2018-12-14 DIAGNOSIS — H25011 Cortical age-related cataract, right eye: Secondary | ICD-10-CM | POA: Diagnosis not present

## 2018-12-14 DIAGNOSIS — H2511 Age-related nuclear cataract, right eye: Secondary | ICD-10-CM | POA: Diagnosis not present

## 2018-12-21 ENCOUNTER — Other Ambulatory Visit: Payer: Self-pay | Admitting: Neurology

## 2018-12-21 MED ORDER — TOPIRAMATE ER 100 MG PO SPRINKLE CAP24: 100.0000 mg | EXTENDED_RELEASE_CAPSULE | Freq: Every day | ORAL | 11 refills | Status: DC

## 2018-12-25 ENCOUNTER — Encounter: Payer: Self-pay | Admitting: Family Medicine

## 2018-12-25 DIAGNOSIS — L600 Ingrowing nail: Secondary | ICD-10-CM

## 2018-12-27 NOTE — Telephone Encounter (Signed)
I have pended referral to podiatry for you. It looks like she saw Dr. Hardie Pulley.

## 2018-12-28 ENCOUNTER — Encounter: Payer: Self-pay | Admitting: Family Medicine

## 2018-12-28 NOTE — Telephone Encounter (Signed)
Pended test strips, however allopurinol is not on patients current med list. Please advise if appropriate to refill.

## 2018-12-30 MED FILL — KETOROLAC 0.5% OPHTH SOLN: 0.5 | 25 days supply | Qty: 5 | Fill #1

## 2019-01-03 ENCOUNTER — Encounter: Payer: Self-pay | Admitting: Podiatry

## 2019-01-03 ENCOUNTER — Telehealth: Payer: Self-pay | Admitting: Neurology

## 2019-01-03 ENCOUNTER — Ambulatory Visit: Payer: 59 | Admitting: Podiatry

## 2019-01-03 ENCOUNTER — Other Ambulatory Visit: Payer: Self-pay | Admitting: Neurology

## 2019-01-03 ENCOUNTER — Other Ambulatory Visit: Payer: Self-pay

## 2019-01-03 DIAGNOSIS — L600 Ingrowing nail: Secondary | ICD-10-CM | POA: Diagnosis not present

## 2019-01-03 MED ORDER — TOPIRAMATE 100 MG PO TABS: 100.0000 mg | ORAL_TABLET | Freq: Every day | ORAL | 4 refills | Status: DC

## 2019-01-03 MED ORDER — NEOMYCIN-POLYMYXIN-HC 3.5-10000-1 OT SOLN: OTIC | 0 refills | Status: DC

## 2019-01-03 MED FILL — CICLOPIROX 8% SOLUTION: 8 | 25 days supply | Qty: 7 | Fill #1

## 2019-01-03 MED FILL — NEO/POLYMYXIN/HC EAR SOLN: 3.5-10000-1 | 50 days supply | Qty: 10 | Fill #0

## 2019-01-03 MED FILL — TOPIRAMATE 100 MG TABLET: 100 | 30 days supply | Qty: 30 | Fill #0

## 2019-01-03 MED FILL — OMEGA-3 ETHYL ESTERS 1 GM C: 1 | 90 days supply | Qty: 360 | Fill #1

## 2019-01-03 NOTE — Patient Instructions (Signed)

## 2019-01-03 NOTE — Telephone Encounter (Signed)
[facility] pharmacy has been calling for a PA for a couple weeks for topiramate ER (QUDEXY XR) 100 MG CS24 sprinkle capsule [name] please call [phone] .

## 2019-01-04 NOTE — Telephone Encounter (Signed)
Qudexy XR 100 mg PA completed on CMM. KEY: [id number]. Awaiting [employer] determination. Patient can use the savings card for $0 copay for 30 capsules regardless of insurance determination.

## 2019-01-05 NOTE — Progress Notes (Signed)
Subjective:   Patient ID: Debbie Hayes, female   DOB: 68 y.o.   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  States that they have been very tender making it hard for her to wear shoe gear comfortably and states that she would like to get these fixed.  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  Explained procedure risk and patient wants surgery and at this time I went ahead and I infiltrated each hallux 60 mg like Marcaine mixture and after sterile prep remove the borders with sterile instrumentation.  I applied phenol to the base 3 applications 30 seconds followed by alcohol lavage sterile dressing and I gave instructions for soaks.  Patient will leave dressings on 24 hours but take them off earlier if any throbbing were to occur and will utilize drops which were given and is encouraged to call with questions

## 2019-01-06 NOTE — Telephone Encounter (Signed)
Yes thanks 

## 2019-01-06 NOTE — Telephone Encounter (Signed)
Received an approval from [facility] for the Topiramate generic for Qudexy XR 100 mg. Approved from 01/03/2019 through 01/02/2020.

## 2019-01-08 MED FILL — UBRELVY 50 MG TABS: 50 | 30 days supply | Qty: 10 | Fill #2

## 2019-01-10 ENCOUNTER — Other Ambulatory Visit: Payer: Self-pay | Admitting: *Deleted

## 2019-01-10 ENCOUNTER — Encounter: Payer: Self-pay | Admitting: Family Medicine

## 2019-01-10 ENCOUNTER — Other Ambulatory Visit: Payer: Self-pay | Admitting: Family Medicine

## 2019-01-10 DIAGNOSIS — Z803 Family history of malignant neoplasm of breast: Secondary | ICD-10-CM | POA: Diagnosis not present

## 2019-01-10 DIAGNOSIS — Z1231 Encounter for screening mammogram for malignant neoplasm of breast: Secondary | ICD-10-CM | POA: Diagnosis not present

## 2019-01-10 DIAGNOSIS — E119 Type 2 diabetes mellitus without complications: Secondary | ICD-10-CM

## 2019-01-10 MED ORDER — TOPIRAMATE ER 100 MG PO SPRINKLE CAP24: 100.0000 mg | EXTENDED_RELEASE_CAPSULE | Freq: Every day | ORAL | 11 refills | Status: DC

## 2019-01-10 MED FILL — METOPROLOL SUCCINATE ER 25: 25 | 90 days supply | Qty: 90 | Fill #1

## 2019-01-10 MED FILL — TOPIRAMATE ER 100 MG CAP: 100 | 30 days supply | Qty: 30 | Fill #0

## 2019-01-10 MED FILL — FARXIGA 5 MG TABLET: 5 | 30 days supply | Qty: 30 | Fill #2

## 2019-01-10 NOTE — Telephone Encounter (Addendum)
Spoke with pt. She is aware of the approval of Topiramate ER 100 mg and that she can switch back to this from the IR. Pt verbalized appreciation for the call. Pt aware the IR will be canceled.

## 2019-01-10 NOTE — Telephone Encounter (Signed)
Topiramate ER 100 mg prescription generic for Qudexy sent to [facility] outpatient pharmacy.

## 2019-01-11 MED FILL — JANUVIA 100 MG TABLET: 100 | 90 days supply | Qty: 90 | Fill #0

## 2019-01-18 MED FILL — NITROFURANTOIN MACROCRYSTAL: 50 | 60 days supply | Qty: 60 | Fill #5

## 2019-01-21 MED FILL — LOSARTAN POTASSIUM 50 MG TA: 50 | 30 days supply | Qty: 30 | Fill #3

## 2019-01-28 ENCOUNTER — Other Ambulatory Visit: Payer: Self-pay | Admitting: Family Medicine

## 2019-01-31 MED FILL — LEVOTHYROXINE 125 MCG TABLE: 125 | 90 days supply | Qty: 90 | Fill #0

## 2019-02-02 ENCOUNTER — Encounter: Payer: Self-pay | Admitting: Family Medicine

## 2019-02-04 MED FILL — TOPIRAMATE ER 100 MG CAP: 100 | 30 days supply | Qty: 30 | Fill #1

## 2019-02-07 MED FILL — FARXIGA 5 MG TABLET: 5 | 30 days supply | Qty: 30 | Fill #3

## 2019-02-08 DIAGNOSIS — H353132 Nonexudative age-related macular degeneration, bilateral, intermediate dry stage: Secondary | ICD-10-CM | POA: Diagnosis not present

## 2019-02-08 DIAGNOSIS — H43813 Vitreous degeneration, bilateral: Secondary | ICD-10-CM | POA: Diagnosis not present

## 2019-02-11 ENCOUNTER — Other Ambulatory Visit: Payer: Self-pay | Admitting: Family Medicine

## 2019-02-11 DIAGNOSIS — G4700 Insomnia, unspecified: Secondary | ICD-10-CM

## 2019-02-14 MED FILL — traZODone HCL 100 MG TABS: 100 | 90 days supply | Qty: 225 | Fill #0

## 2019-02-16 MED FILL — FARXIGA 5 MG TABLET: 5 | 30 days supply | Qty: 30 | Fill #3

## 2019-02-21 MED FILL — LOSARTAN POTASSIUM 50 MG TA: 50 | 30 days supply | Qty: 30 | Fill #4

## 2019-02-22 ENCOUNTER — Encounter: Payer: Self-pay | Admitting: Neurology

## 2019-02-22 ENCOUNTER — Encounter: Payer: Self-pay | Admitting: Family Medicine

## 2019-02-22 ENCOUNTER — Other Ambulatory Visit: Payer: Self-pay

## 2019-02-22 ENCOUNTER — Ambulatory Visit: Payer: 59 | Admitting: Neurology

## 2019-02-22 VITALS — BP 101/61 | HR 63 | Temp 97.7°F | Ht 68.0 in | Wt 177.8 lb

## 2019-02-22 DIAGNOSIS — G894 Chronic pain syndrome: Secondary | ICD-10-CM

## 2019-02-22 DIAGNOSIS — G43711 Chronic migraine without aura, intractable, with status migrainosus: Secondary | ICD-10-CM

## 2019-02-22 MED ORDER — TOPIRAMATE ER 100 MG PO SPRINKLE CAP24: 100.0000 mg | EXTENDED_RELEASE_CAPSULE | Freq: Every day | ORAL | 4 refills | Status: DC

## 2019-02-22 NOTE — Progress Notes (Signed)
[facility]    Provider:  Dr Jaynee Eagles Requesting Provider: Lorelei Pont, Gay Filler, MD Primary Care Provider:  Darreld Mclean, MD  CC:  Migraine  HPI:  Debbie Hayes is a 68 y.o. female here as requested by Copland, Gay Filler, MD for migraines. SHE IS DOING EXCEPTIONALLY WELL on Topiramate XR. She has not used her triptans. Extremely well. She is doing well with sleep apnea, using a dental device, she is not snoring.   Last visit April 2020:   Patient is not doing well. She is having 15-18 migraine days a month. We will stop botox. She has failed all three CGRPs. She has chronic pain and takes oxycodone sparingly. She has chronic neck pain and take skelaxone. She has sleep-related headaches and snoring and has followed with Dr. Westley Hummer, our sleep specialist and had sleep study.  failed Emgality/Ajovy/Aimovig She would like a referral to [facility] Start Vyepti for preventative - have started approval proess Start Topiramate XR - she does not remember what happened when she tried Topiramate it was years ago will try again [place] for acute management Start topiramate ER  NO medication overuse Has had sleep evaluation/study MRI brain normal Continue to follow with Dr. Brett Fairy for sleep Continue Skelaxone for neck pain  Migraine preventative meds tried and failed: Botox 5 injections (also tried this in the past), Emgality/Ajovy/Aimovig, Did not tolerate Topiramate IR in the past will try ER, metoprolol, atenolol, depakote, effexor, and multiple other first, second and third line preventatives and acute medications over the many years of migraines, has been to the [facility] in the past and other physicians/neurologists. She has not been on Depakote or Zonegran, unsure of the TCAs.  No orders of the defined types were placed in this encounter.  No orders of the defined types were placed in this encounter.  A total of 25 minutes was spent  face-to-face with this patient. Over half this time was spent on counseling patient on the  No diagnosis found. diagnosis and different diagnostic and therapeutic options, counseling and coordination of care, risks ans benefits of management, compliance, or risk factor reduction and education.  This does not inclde time spent on botox procedure.   Review of Systems: Patient complains of symptoms per HPI as well as the following symptoms: migraine. Pertinent negatives and positives per HPI. All others negative.   Social History   Socioeconomic History  . Marital status: Married    Spouse name: Not on file  . Number of children: Not on file  . Years of education: Not on file  . Highest education level: Not on file  Occupational History  . Not on file  Social Needs  . Financial resource strain: Not on file  . Food insecurity    Worry: Not on file    Inability: Not on file  . Transportation needs    Medical: Not on file    Non-medical: Not on file  Tobacco Use  . Smoking status: Never Smoker  . Smokeless tobacco: Never Used  Substance and Sexual Activity  . Alcohol use: Yes    Comment: RARE  . Drug use: No  . Sexual activity: Not on file  Lifestyle  . Physical activity    Days per week: Not on file    Minutes per session: Not on file  . Stress: Not on file  Relationships  . Social Herbalist on phone: Not on file    Gets together:  Not on file    Attends religious service: Not on file    Active member of club or organization: Not on file    Attends meetings of clubs or organizations: Not on file    Relationship status: Not on file  . Intimate partner violence    Fear of current or ex partner: Not on file    Emotionally abused: Not on file    Physically abused: Not on file    Forced sexual activity: Not on file  Other Topics Concern  . Not on file  Social History Narrative  . Not on file    Family History  Problem Relation Age of Onset  . Colon polyps  Sister 1  . Alzheimer's disease Mother   . Heart disease Father   . Cirrhosis Father   . Heart attack Father   . Macular degeneration Father   . Colon cancer Neg Hx     Past Medical History:  Diagnosis Date  . Allergy   . Cancer [facility])    thyroid cancer  . Cataract   . Chronic migraine BOTOX INJECTION EVERY 3 MONTHS  . Chronic pain in right shoulder   . Constipation   . Diabetes mellitus   . Disorder of inner ear CAUSES VERTIGO OCCASIONALLY  . Fibromyalgia   . GERD (gastroesophageal reflux disease)   . Hemorrhoid   . History of bladder infections   . History of thyroid cancer 2009  S/P TOTAL THYROIDECTOMY AND RADIATION  . Hyperlipidemia   . Hypertension CARDIOLOGIST- DR Wynonia Lawman- WILL REQUEST LATE NOTE   DENIES S & S  . Hypothyroidism   . Insomnia   . Left shoulder pain   . Macular degeneration   . MRSA infection    nasal treated more than 15 years ago  . Normal nuclear stress test 01-25-2009  . OA (osteoarthritis) JOINTS  . Osteopenia   . Painful orthopaedic hardware ([place])    left foot  . PONV (postoperative nausea and vomiting) SEVERE  . Rotator cuff disorder LEFT SHOULDER RTC IMPINGMENT  . Sleep apnea    mouth guard, no cpap   . Sleep apnea in adult 06/26/2017  . Spondylolisthesis of lumbar region   . 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 Wears glasses     Patient Active Problem List   Diagnosis Date Noted  . Chronic neck pain 11/10/2018  . Chronic migraine with aura 12/15/2017  . Diabetes mellitus due to underlying condition with complication ([place]) 01/60/1093  . 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 Arthralgia 12/15/2017  . OSA (obstructive sleep apnea) 06/26/2017  . Snoring 03/25/2016  . Palpitations 03/25/2016  . Sleep related headaches 03/25/2016  . Trigeminal anesthesia 11/26/2015  . Spondylolisthesis of lumbar region 08/09/2014  . Back pain 02/17/2014  . Macular degeneration 02/17/2014  . Other and  unspecified hyperlipidemia 10/14/2013  . Onychomycosis 12/01/2012  . Arthritis 04/12/2012  . Hypothyroid 04/12/2012    Past Surgical History:  Procedure Laterality Date  . BACK SURGERY     Dr Arnoldo Morale- spondylosis  . BILATERAL CARPAL TUNNEL RELEASE  1994  . BILATERAL ELBOW SURG.  1999  . BILATERAL SALPINGOOPHECTOMY  1993   POST-OP URETER REPAIR 12 DAYS AFTER   . COLONOSCOPY    . FOOT ARTHRODESIS Left 07/10/2016   Procedure: LEFT 2ND TARSAL METATARSAL ARTHRODESIS  GASTROC RECESSION LEFT LAPIDUS MODIFIED MCBRIDE BUIONECTOMY;  Surgeon: Wylene Simmer, MD;  Location: [facility];  Service: Orthopedics;  Laterality:  Left;  . GASTROC RECESSION EXTREMITY Left 07/10/2016   Procedure: GASTROC RECESSION EXTREMITY;  Surgeon: Wylene Simmer, MD;  Location: [facility];  Service: Orthopedics;  Laterality: Left;  . HARDWARE REMOVAL Left 05/20/2018   Procedure: Left foot removal of deep implants;  Surgeon: Wylene Simmer, MD;  Location: [facility];  Service: Orthopedics;  Laterality: Left;  53mn  . LEFT THUMB JOINT REPLACEMENT  2005   RIGHT DONE IN 2004  . OOPHORECTOMY    . POLYPECTOMY    . RIGHT KNEE ARTHROSCOPY  X2  BEFORE 2011  . RIGHT KNEE ARTHROSCOPY/ PARTIAL LATERAL MENISECTOMY/ TRICOMPARTMENT CHONDROPLASTY/ DECOMPRESSION CYST  10-26-2009  . RIGHT KNEE CLOSED MANIPULATION  09-11-2010  . RIGHT SHOULDER ARTHROSCOPY  2010  &  2004  . TIBIA CYST REMOVED AND ORIF LEG FX  1958  . TONSILLECTOMY  1968  . TOTAL KNEE ARTHROPLASTY  07-16-2010   RIGHT  . TOTAL THYROIDECTOMY  2009   CANCER  (POST-OP BLEED)  AND RADIATION [place]  . trigger fingers Right 2013   3rd and 4th fingers  . UPPER GASTROINTESTINAL ENDOSCOPY    . VAGINAL HYSTERECTOMY  1990    Current Outpatient Medications  Medication Sig Dispense Refill  . acetaminophen (TYLENOL) 500 MG tablet Take 500 mg by mouth every 6 (six) hours as needed.    . benzonatate (TESSALON) 200 MG capsule Take 1 capsule (200  mg total) by mouth 2 (two) times daily as needed for cough. 90 capsule 0  . blood glucose meter kit and supplies KIT Test blood sugar once daily. Dx code: E11.9 1 each 0  . Blood Glucose Monitoring Suppl (FREESTYLE LITE) DEVI Check blood sugar no more than twice daily 1 each 0  . cetirizine (ZYRTEC) 10 MG tablet Take 10 mg by mouth daily as needed for allergies.     . dapagliflozin propanediol (FARXIGA) 10 MG TABS tablet Take 10 mg by mouth daily. 90 tablet 3  . diclofenac (FLECTOR) 1.3 % PTCH PLACE 1 PATCH ONTO THE SKIN 2 TIMES DAILY. 180 patch PRN  . glucose blood (FREESTYLE LITE) test strip CHECK BLOOD SUGARS NO MORE THAN TWICE DAILY 100 each 12  . JANUVIA 100 MG tablet TAKE 1 TABLET BY MOUTH DAILY. 90 tablet 3  . Lancets (FREESTYLE) lancets Check blood sugars no more than twice daily 100 each 12  . levothyroxine (SYNTHROID) 125 MCG tablet TAKE 1 TABLET (125 MCG TOTAL) BY MOUTH DAILY BEFORE BREAKFAST. 90 tablet 1  . losartan (COZAAR) 50 MG tablet TAKE 1 TABLET BY MOUTH DAILY. 90 tablet 1  . metaxalone (SKELAXIN) 800 MG tablet TAKE 1 TABLET BY MOUTH 3 TIMES DAILY. 90 tablet 3  . metoprolol succinate (TOPROL-XL) 25 MG 24 hr tablet Take 1 tablet (25 mg total) by mouth daily. Take with or immediately following a meal. 90 tablet 3  . montelukast (SINGULAIR) 10 MG tablet TAKE 1 TABLET BY MOUTH AT BEDTIME. 90 tablet 1  . Multiple Vitamins-Minerals (ICAPS AREDS 2) CAPS Take by mouth.    . naproxen sodium (ALEVE) 220 MG tablet Take 440 mg by mouth.    .Marland KitchenNASONEX 50 MCG/ACT nasal spray PLACE 2 SPRAYS INTO THE NOSE DAILY. 17 g 9  . nitrofurantoin (MACRODANTIN) 50 MG capsule Take 1 capsule (50 mg total) by mouth at bedtime. 90 capsule 3  . omega-3 acid ethyl esters (LOVAZA) 1 g capsule Take 2 capsules (2 g total) by mouth 2 (two) times daily. 360 capsule 3  . ondansetron (ZOFRAN) 4  MG tablet TAKE 1 TABLET BY MOUTH EVERY 8 HOURS AS NEEDED FOR NAUSEA AND VOMITING 30 tablet 1  . oxyCODONE-acetaminophen  (PERCOCET) 7.5-325 MG tablet Take 1 tablet by mouth every 8 (eight) hours as needed for severe pain. 30 tablet 0  . topiramate ER (QUDEXY XR) 100 MG CS24 sprinkle capsule Take 1 capsule (100 mg total) by mouth at bedtime. 90 capsule 4  . traZODone (DESYREL) 100 MG tablet TAKE 2.5 TABLETS BY MOUTH AT BEDTIME. 225 tablet 3  . Ubrogepant (UBRELVY) 50 MG TABS Take 50 mg by mouth every 2 (two) hours as needed. Max 265m a day 10 tablet 6  . zolpidem (AMBIEN) 5 MG tablet TAKE 1/2 TABLET BY MOUTH AT BEDTIME AS NEEDED FOR SLEEP 30 tablet 1  . amoxicillin (AMOXIL) 500 MG capsule Take 2 capsules (1,000 mg total) by mouth 2 (two) times daily. 40 capsule 0  . ciclopirox (PENLAC) 8 % solution APPLY TO NAIL ONCE A DAY AND SURROUNDING SKIN. AFTER 7 DAYS REMOVE WITH ALCOHOL AND CONTINUE USE FOR UP TO 48 WEEKS 6.6 mL 1  . esomeprazole (NEXIUM) 40 MG capsule Take 40 mg by mouth daily at 12 noon.    . minoxidil (ROGAINE) 2 % external solution Apply topically 2 (two) times daily.    .Marland Kitchenneomycin-polymyxin-hydrocortisone (CORTISPORIN) OTIC solution Apply 1-2 drops to toe after soaking twice a day 10 mL 0  . rizatriptan (MAXALT) 10 MG tablet TAKE 1 TABLET BY MOUTH AS NEEDED. MAY REPEAT IN 2 HOURS IF NEEDED 10 tablet 5  . SUMAtriptan (IMITREX) 100 MG tablet Take 1 tablet (100 mg total) by mouth once for 1 dose. May repeat in 2 hours if headache persists or recurs. 10 tablet 5   No current facility-administered medications for this visit.     Allergies as of 02/22/2019 - Review Complete 02/22/2019  Allergen Reaction Noted  . Ciprofloxacin Diarrhea 07/10/2016  . Sulfa antibiotics Hives 09/05/2011    Vitals: BP 101/61   Pulse 63   Temp 97.7 F (36.5 C) (Oral)   Ht _0  (1.727 m)   Wt 177 lb 12.8 oz (80.6 kg)   BMI 27.03 kg/m  Last Weight:  Wt Readings from Last 1 Encounters:  02/22/19 177 lb 12.8 oz (80.6 kg)   Last Height:   Ht Readings from Last 1 Encounters:  02/22/19 _1  (1.727 m)     Physical  exam: Exam: Gen: NAD, conversant, well nourised, obese, well groomed                     CV: RRR, no MRG. No Carotid Bruits. No peripheral edema, warm, nontender Eyes: Conjunctivae clear without exudates or hemorrhage  Neuro: Detailed Neurologic Exam  Speech:    Speech is normal; fluent and spontaneous with normal comprehension.  Cognition:    The patient is oriented to person, place, and time;     recent and remote memory intact;     language fluent;     normal attention, concentration,     fund of knowledge Cranial Nerves:    The pupils are equal, round, and reactive to light. The fundi are normal and spontaneous venous pulsations are present. Visual fields are full to finger confrontation. Extraocular movements are intact. Trigeminal sensation is intact and the muscles of mastication are normal. The face is symmetric. The palate elevates in the midline. Hearing intact. Voice is normal. Shoulder shrug is normal. The tongue has normal motion without fasciculations.   Coordination:  Normal finger to nose and heel to shin. Normal rapid alternating movements.   Gait:    Heel-toe and tandem gait are normal.   Motor Observation:    No asymmetry, no atrophy, and no involuntary movements noted. Tone:    Normal muscle tone.    Posture:    Posture is normal. normal erect    Strength:    Strength is V/V in the upper and lower limbs.      Sensation: intact to LT     Reflex Exam:  DTR's:    Deep tendon reflexes in the upper and lower extremities are normal bilaterally.   Toes:    The toes are downgoing bilaterally.   Clonus:    Clonus is absent.    Assessment/Plan:  Patient with chronic migraines doing exceptional well on Qudexy  Meds ordered this encounter  Medications  . topiramate ER (QUDEXY XR) 100 MG CS24 sprinkle capsule    Sig: Take 1 capsule (100 mg total) by mouth at bedtime.    Dispense:  90 capsule    Refill:  4    Cc: Copland, Jessica C, MD,    A total  of 25 minutes was spent face-to-face with this patient. Over half this time was spent on counseling patient on the  1. Chronic migraine without aura, with intractable migraine, so stated, with status migrainosus    diagnosis and different diagnostic and therapeutic options, counseling and coordination of care, risks ans benefits of management, compliance, or risk factor reduction and education.    Sarina Ill, MD  [facility] [address] [place], [place] [address]  Phone [phone] Fax [phone]

## 2019-02-23 MED ORDER — OXYCODONE HCL 7.5 MG PO TABS: ORAL_TABLET | ORAL | 0 refills | Status: DC

## 2019-02-23 NOTE — Addendum Note (Signed)
Addended by: Darreld Mclean on: 02/23/2019 06:47 PM   Modules accepted: Orders

## 2019-02-24 MED FILL — OXAYDO 7.5 MG TABLET: 7.5 | 10 days supply | Qty: 30 | Fill #0

## 2019-03-03 MED FILL — OXAYDO 7.5 MG TABLET: 7.5 | 10 days supply | Qty: 30 | Fill #0

## 2019-03-05 MED FILL — TOPIRAMATE ER 100 MG CAP: 100 | 30 days supply | Qty: 30 | Fill #2

## 2019-03-12 MED FILL — FARXIGA 5 MG TABLET: 5 | 30 days supply | Qty: 30 | Fill #4

## 2019-03-15 DIAGNOSIS — H353130 Nonexudative age-related macular degeneration, bilateral, stage unspecified: Secondary | ICD-10-CM | POA: Diagnosis not present

## 2019-03-15 DIAGNOSIS — Z961 Presence of intraocular lens: Secondary | ICD-10-CM | POA: Diagnosis not present

## 2019-03-19 MED FILL — NITROFURANTOIN MACROCRYSTAL: 50 | 60 days supply | Qty: 60 | Fill #6

## 2019-03-23 MED FILL — LOSARTAN POTASSIUM 50 MG TA: 50 | 30 days supply | Qty: 30 | Fill #5

## 2019-03-25 DIAGNOSIS — M4316 Spondylolisthesis, lumbar region: Secondary | ICD-10-CM | POA: Diagnosis not present

## 2019-03-25 DIAGNOSIS — M5136 Other intervertebral disc degeneration, lumbar region: Secondary | ICD-10-CM | POA: Diagnosis not present

## 2019-03-25 DIAGNOSIS — Z6828 Body mass index (BMI) 28.0-28.9, adult: Secondary | ICD-10-CM | POA: Diagnosis not present

## 2019-03-25 MED FILL — FARXIGA 10 MG TABLET: 10 | 90 days supply | Qty: 90 | Fill #1

## 2019-03-25 MED FILL — METOPROLOL SUCCINATE ER 50: 50 | 90 days supply | Qty: 90 | Fill #1

## 2019-03-31 ENCOUNTER — Other Ambulatory Visit: Payer: Self-pay | Admitting: Neurosurgery

## 2019-04-05 MED FILL — TOPIRAMATE ER 100 MG CAP: 100 | 30 days supply | Qty: 30 | Fill #3

## 2019-04-05 MED FILL — JANUVIA 100 MG TABLET: 100 | 90 days supply | Qty: 90 | Fill #1

## 2019-04-07 ENCOUNTER — Ambulatory Visit: Payer: 59 | Admitting: Family Medicine

## 2019-04-09 ENCOUNTER — Telehealth: Payer: 59 | Admitting: Family

## 2019-04-09 DIAGNOSIS — R300 Dysuria: Secondary | ICD-10-CM

## 2019-04-09 MED ORDER — CEPHALEXIN 500 MG PO CAPS: 500.0000 mg | ORAL_CAPSULE | Freq: Two times a day (BID) | ORAL | 0 refills | Status: DC

## 2019-04-09 NOTE — Progress Notes (Signed)
We are sorry that you are not feeling well.  Here is how we plan to help!  Based on what you shared with me it looks like you most likely have a simple urinary tract infection.  A UTI (Urinary Tract Infection) is a bacterial infection of the bladder.  Most cases of urinary tract infections are simple to treat but a key part of your care is to encourage you to drink plenty of fluids and watch your symptoms carefully.  I have prescribed Keflex 500 mg twice a day for 7 days.  Your symptoms should gradually improve. Call us if the burning in your urine worsens, you develop worsening fever, back pain or pelvic pain or if your symptoms do not resolve after completing the antibiotic.  Urinary tract infections can be prevented by drinking plenty of water to keep your body hydrated.  Also be sure when you wipe, wipe from front to back and don't hold it in!  If possible, empty your bladder every 4 hours.  Your e-visit answers were reviewed by a board certified advanced clinical practitioner to complete your personal care plan.  Depending on the condition, your plan could have included both over the counter or prescription medications.  If there is a problem please reply  once you have received a response from your provider.  Your safety is important to us.  If you have drug allergies check your prescription carefully.    You can use MyChart to ask questions about today's visit, request a non-urgent call back, or ask for a work or school excuse for 24 hours related to this e-Visit. If it has been greater than 24 hours you will need to follow up with your provider, or enter a new e-Visit to address those concerns.   You will get an e-mail in the next two days asking about your experience.  I hope that your e-visit has been valuable and will speed your recovery. Thank you for using e-visits.   Greater than 5 minutes, yet less than 10 minutes of time have been spent researching, coordinating, and  implementing care for this patient today.  Thank you for the details you included in the comment boxes. Those details are very helpful in determining the best course of treatment for you and help us to provide the best care.   

## 2019-04-09 NOTE — Progress Notes (Signed)
[place] at [facility] [address], [place], [place] [address] [phone]  Date:  04/11/2019   Name:  Debbie Hayes   DOB:  1951/05/26   MRN:  [id number]  PCP:  Darreld Mclean, MD    Chief Complaint: Diabetes   History of Present Illness:  Debbie Hayes is a 68 y.o. very pleasant female patient who presents with the following:  Periodic follow-up visit today for this nice lady, retired Marine scientist, with history of DM, OSA, chronic migraine, hypothyroidism, hyperlipemia, degen spine disease  Married to my former partner Dr Ruben Reason. Has 4 children including one adult daughter with special needs.  They are expecting their seventh grandchild later this year Dr Jaynee Eagles manages her chronic migraine- last visit with her in July of this year at which point she was doing quite well  She is on Qudexy 100 g at bedtime which is helping a lot to reduce her migraine days -it does make her feel slightly "dizzy" sometimes, but it is worth its for the great relief she has gotten from chronic migraine She wonders if I be willing to refill this medication, as otherwise she does not really need to continue seeing neurology.  This is fine  It looks like Dr Arnoldo Morale plans to do a lumbar fusion L3/L4 for her this fall - she is feeling positive about this operation as she would like to be more active, and right now her back really restricts her activities   Lab Results  Component Value Date   HGBA1C 6.5 10/06/2018   Due for A1c today Eye exam: recently per patient mammo just done Colon UTD dexa a year ago  Flu shot done already this fall Status post hysterectomy   Patient Active Problem List   Diagnosis Date Noted  . Chronic neck pain 11/10/2018  . Chronic migraine with aura 12/15/2017  . Diabetes mellitus due to underlying condition with complication ([place]) [id number]  . Intractable chronic migraine without aura and with status  migrainosus 12/15/2017  . Arthralgia 12/15/2017  . OSA (obstructive sleep apnea) 06/26/2017  . Snoring 03/25/2016  . Palpitations 03/25/2016  . Trigeminal anesthesia 11/26/2015  . Spondylolisthesis of lumbar region 08/09/2014  . Back pain 02/17/2014  . Macular degeneration 02/17/2014  . Other and unspecified hyperlipidemia 10/14/2013  . Onychomycosis 12/01/2012  . Arthritis 04/12/2012  . Hypothyroid 04/12/2012    Past Medical History:  Diagnosis Date  . Allergy   . Cancer [facility])    thyroid cancer  . Cataract   . Chronic migraine BOTOX INJECTION EVERY 3 MONTHS  . Chronic pain in right shoulder   . Constipation   . Diabetes mellitus   . Disorder of inner ear CAUSES VERTIGO OCCASIONALLY  . Fibromyalgia   . GERD (gastroesophageal reflux disease)   . Hemorrhoid   . History of bladder infections   . History of thyroid cancer 2009  S/P TOTAL THYROIDECTOMY AND RADIATION  . Hyperlipidemia   . Hypertension CARDIOLOGIST- DR Wynonia Lawman- WILL REQUEST LATE NOTE   DENIES S & S  . Hypothyroidism   . Insomnia   . Left shoulder pain   . Macular degeneration   . MRSA infection    nasal treated more than 15 years ago  . Normal nuclear stress test 01-25-2009  . OA (osteoarthritis) JOINTS  . Osteopenia   . Painful orthopaedic hardware ([place])    left foot  . PONV (postoperative nausea and vomiting) SEVERE  .  Rotator cuff disorder LEFT SHOULDER RTC IMPINGMENT  . Sleep apnea    mouth guard, no cpap   . Sleep apnea in adult 06/26/2017  . Spondylolisthesis of lumbar region   . 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 Wears glasses     Past Surgical History:  Procedure Laterality Date  . BACK SURGERY     Dr Arnoldo Morale- spondylosis  . BILATERAL CARPAL TUNNEL RELEASE  1994  . BILATERAL ELBOW SURG.  1999  . BILATERAL SALPINGOOPHECTOMY  1993   POST-OP URETER REPAIR 12 DAYS AFTER   . COLONOSCOPY    . FOOT ARTHRODESIS Left 07/10/2016   Procedure: LEFT 2ND TARSAL METATARSAL ARTHRODESIS  GASTROC  RECESSION LEFT LAPIDUS MODIFIED MCBRIDE BUIONECTOMY;  Surgeon: Wylene Simmer, MD;  Location: [place];  Service: Orthopedics;  Laterality: Left;  Marland Kitchen GASTROC RECESSION EXTREMITY Left 07/10/2016   Procedure: GASTROC RECESSION EXTREMITY;  Surgeon: Wylene Simmer, MD;  Location: [facility];  Service: Orthopedics;  Laterality: Left;  . HARDWARE REMOVAL Left 05/20/2018   Procedure: Left foot removal of deep implants;  Surgeon: Wylene Simmer, MD;  Location: [facility];  Service: Orthopedics;  Laterality: Left;  25mn  . LEFT THUMB JOINT REPLACEMENT  2005   RIGHT DONE IN 2004  . OOPHORECTOMY    . POLYPECTOMY    . RIGHT KNEE ARTHROSCOPY  X2  BEFORE 2011  . RIGHT KNEE ARTHROSCOPY/ PARTIAL LATERAL MENISECTOMY/ TRICOMPARTMENT CHONDROPLASTY/ DECOMPRESSION CYST  10-26-2009  . RIGHT KNEE CLOSED MANIPULATION  09-11-2010  . RIGHT SHOULDER ARTHROSCOPY  2010  &  2004  . TIBIA CYST REMOVED AND ORIF LEG FX  1958  . TONSILLECTOMY  1968  . TOTAL KNEE ARTHROPLASTY  07-16-2010   RIGHT  . TOTAL THYROIDECTOMY  2009   CANCER  (POST-OP BLEED)  AND RADIATION [place]  . trigger fingers Right 2013   3rd and 4th fingers  . UPPER GASTROINTESTINAL ENDOSCOPY    . VAGINAL HYSTERECTOMY  1990    Social History   Tobacco Use  . Smoking status: Never Smoker  . Smokeless tobacco: Never Used  Substance Use Topics  . Alcohol use: Yes    Comment: RARE  . Drug use: No    Family History  Problem Relation Age of Onset  . Colon polyps Sister 56 . Alzheimer's disease Mother   . Heart disease Father   . Cirrhosis Father   . Heart attack Father   . Macular degeneration Father   . Colon cancer Neg Hx     Allergies  Allergen Reactions  . Ciprofloxacin Diarrhea    Neurological effect   . Sulfa Antibiotics Hives    Medication list has been reviewed and updated.  Current Outpatient Medications on File Prior to Visit  Medication Sig Dispense Refill  . acetaminophen (TYLENOL) 500  MG tablet Take 500 mg by mouth every 6 (six) hours as needed.    .Marland Kitchenamoxicillin (AMOXIL) 500 MG capsule Take 2 capsules (1,000 mg total) by mouth 2 (two) times daily. 40 capsule 0  . benzonatate (TESSALON) 200 MG capsule Take 1 capsule (200 mg total) by mouth 2 (two) times daily as needed for cough. 90 capsule 0  . blood glucose meter kit and supplies KIT Test blood sugar once daily. Dx code: E11.9 1 each 0  . Blood Glucose Monitoring Suppl (FREESTYLE LITE) DEVI Check blood sugar no more than twice daily 1 each 0  . cephALEXin (KEFLEX) 500 MG capsule Take 1 capsule (500 mg  total) by mouth 2 (two) times daily. 14 capsule 0  . cetirizine (ZYRTEC) 10 MG tablet Take 10 mg by mouth daily as needed for allergies.     . ciclopirox (PENLAC) 8 % solution APPLY TO NAIL ONCE A DAY AND SURROUNDING SKIN. AFTER 7 DAYS REMOVE WITH ALCOHOL AND CONTINUE USE FOR UP TO 48 WEEKS 6.6 mL 1  . dapagliflozin propanediol (FARXIGA) 10 MG TABS tablet Take 10 mg by mouth daily. 90 tablet 3  . diclofenac (FLECTOR) 1.3 % PTCH PLACE 1 PATCH ONTO THE SKIN 2 TIMES DAILY. 180 patch PRN  . esomeprazole (NEXIUM) 40 MG capsule Take 40 mg by mouth daily at 12 noon.    Marland Kitchen glucose blood (FREESTYLE LITE) test strip CHECK BLOOD SUGARS NO MORE THAN TWICE DAILY 100 each 12  . JANUVIA 100 MG tablet TAKE 1 TABLET BY MOUTH DAILY. 90 tablet 3  . Lancets (FREESTYLE) lancets Check blood sugars no more than twice daily 100 each 12  . levothyroxine (SYNTHROID) 125 MCG tablet TAKE 1 TABLET (125 MCG TOTAL) BY MOUTH DAILY BEFORE BREAKFAST. 90 tablet 1  . losartan (COZAAR) 50 MG tablet TAKE 1 TABLET BY MOUTH DAILY. 90 tablet 1  . metaxalone (SKELAXIN) 800 MG tablet TAKE 1 TABLET BY MOUTH 3 TIMES DAILY. 90 tablet 3  . metoprolol succinate (TOPROL-XL) 25 MG 24 hr tablet Take 1 tablet (25 mg total) by mouth daily. Take with or immediately following a meal. 90 tablet 3  . minoxidil (ROGAINE) 2 % external solution Apply topically 2 (two) times daily.    .  montelukast (SINGULAIR) 10 MG tablet TAKE 1 TABLET BY MOUTH AT BEDTIME. 90 tablet 1  . Multiple Vitamins-Minerals (ICAPS AREDS 2) CAPS Take by mouth.    . naproxen sodium (ALEVE) 220 MG tablet Take 440 mg by mouth.    Marland Kitchen NASONEX 50 MCG/ACT nasal spray PLACE 2 SPRAYS INTO THE NOSE DAILY. 17 g 9  . neomycin-polymyxin-hydrocortisone (CORTISPORIN) OTIC solution Apply 1-2 drops to toe after soaking twice a day 10 mL 0  . nitrofurantoin (MACRODANTIN) 50 MG capsule Take 1 capsule (50 mg total) by mouth at bedtime. 90 capsule 3  . omega-3 acid ethyl esters (LOVAZA) 1 g capsule Take 2 capsules (2 g total) by mouth 2 (two) times daily. 360 capsule 3  . ondansetron (ZOFRAN) 4 MG tablet TAKE 1 TABLET BY MOUTH EVERY 8 HOURS AS NEEDED FOR NAUSEA AND VOMITING 30 tablet 1  . oxyCODONE HCl 7.5 MG TABA Take 1 every 8 hours as needed for severe pain 30 tablet 0  . oxyCODONE-acetaminophen (PERCOCET) 7.5-325 MG tablet Take 1 tablet by mouth every 8 (eight) hours as needed for severe pain. 30 tablet 0  . rizatriptan (MAXALT) 10 MG tablet TAKE 1 TABLET BY MOUTH AS NEEDED. MAY REPEAT IN 2 HOURS IF NEEDED 10 tablet 5  . topiramate ER (QUDEXY XR) 100 MG CS24 sprinkle capsule Take 1 capsule (100 mg total) by mouth at bedtime. 90 capsule 4  . traZODone (DESYREL) 100 MG tablet TAKE 2.5 TABLETS BY MOUTH AT BEDTIME. 225 tablet 3  . Ubrogepant (UBRELVY) 50 MG TABS Take 50 mg by mouth every 2 (two) hours as needed. Max '200mg'$  a day 10 tablet 6  . zolpidem (AMBIEN) 5 MG tablet TAKE 1/2 TABLET BY MOUTH AT BEDTIME AS NEEDED FOR SLEEP 30 tablet 1  . SUMAtriptan (IMITREX) 100 MG tablet Take 1 tablet (100 mg total) by mouth once for 1 dose. May repeat in 2 hours if headache  persists or recurs. 10 tablet 5   No current facility-administered medications on file prior to visit.     Review of Systems:  As per HPI- otherwise negative. No fever or chills, no chest pain or shortness of breath She has worked hard to keep her weight stable  despite less ability to exercise  Wt Readings from Last 3 Encounters:  04/11/19 181 lb (82.1 kg)  02/22/19 177 lb 12.8 oz (80.6 kg)  10/06/18 172 lb (78 kg)     Physical Examination: Vitals:   04/11/19 1544  BP: 102/60  Pulse: 68  Resp: 16  Temp: (!) 97.1 F (36.2 C)  SpO2: 98%   Vitals:   04/11/19 1544  Weight: 181 lb (82.1 kg)  Height: '5\' 8"'$  (1.727 m)   Body mass index is 27.52 kg/m. Ideal Body Weight: Weight in (lb) to have BMI = 25: 164.1  GEN: WDWN, NAD, Non-toxic, A & O x 3, mild overweight, looks well HEENT: Atraumatic, Normocephalic. Neck supple. No masses, No LAD. Ears and Nose: No external deformity. CV: RRR, No M/G/R. No JVD. No thrill. No extra heart sounds. PULM: CTA B, no wheezes, crackles, rhonchi. No retractions. No resp. distress. No accessory muscle use. ABD: S, NT, ND, +BS. No rebound. No HSM. EXTR: No c/c/e NEURO Normal gait.  PSYCH: Normally interactive. Conversant. Not depressed or anxious appearing.  Calm demeanor.    Assessment and Plan: Diabetes mellitus without complication ([place]) - Plan: Comprehensive metabolic panel, Hemoglobin A1c, Microalbumin / creatinine urine ratio  Chronic pain syndrome  Dyslipidemia - Plan: Lipid panel  Essential hypertension - Plan: CBC, Comprehensive metabolic panel  Postoperative hypothyroidism - Plan: TSH  Here today for follow-up visit Labs pain as above to monitor diabetes, cholesterol, thyroid Blood pressure under good control-she is taking losartan With labs mention the blood pressure is low, could consider reducing her dose  BP Readings from Last 3 Encounters:  04/11/19 102/60  02/22/19 101/61  10/06/18 110/68     Signed Lamar Blinks, MD

## 2019-04-11 ENCOUNTER — Encounter: Payer: Self-pay | Admitting: Family Medicine

## 2019-04-11 ENCOUNTER — Other Ambulatory Visit: Payer: Self-pay

## 2019-04-11 ENCOUNTER — Ambulatory Visit: Payer: 59 | Admitting: Family Medicine

## 2019-04-11 VITALS — BP 102/60 | HR 68 | Temp 97.1°F | Resp 16 | Ht 68.0 in | Wt 181.0 lb

## 2019-04-11 DIAGNOSIS — E119 Type 2 diabetes mellitus without complications: Secondary | ICD-10-CM | POA: Diagnosis not present

## 2019-04-11 DIAGNOSIS — E890 Postprocedural hypothyroidism: Secondary | ICD-10-CM | POA: Diagnosis not present

## 2019-04-11 DIAGNOSIS — E785 Hyperlipidemia, unspecified: Secondary | ICD-10-CM

## 2019-04-11 DIAGNOSIS — I10 Essential (primary) hypertension: Secondary | ICD-10-CM

## 2019-04-11 DIAGNOSIS — G894 Chronic pain syndrome: Secondary | ICD-10-CM | POA: Diagnosis not present

## 2019-04-11 NOTE — Patient Instructions (Addendum)
It was great to see you again today, I will be in touch with your labs ASAP Please make sure the lab checks your urine as well Tell Debbie Hayes BIRTHDAY from me!    Let's plan to visit in about 6 months Consider having shingrix vaccine at your drug store at your convenience

## 2019-04-15 ENCOUNTER — Other Ambulatory Visit: Payer: Self-pay | Admitting: Neurosurgery

## 2019-04-16 IMAGING — DX DG CHEST 2V
2 series · 2 of 2 positions shown · non-contrast
Comparison: 01/03/2015

CLINICAL DATA: Chronic cough for several weeks

EXAM:
CHEST  2 VIEW

[chest pa]
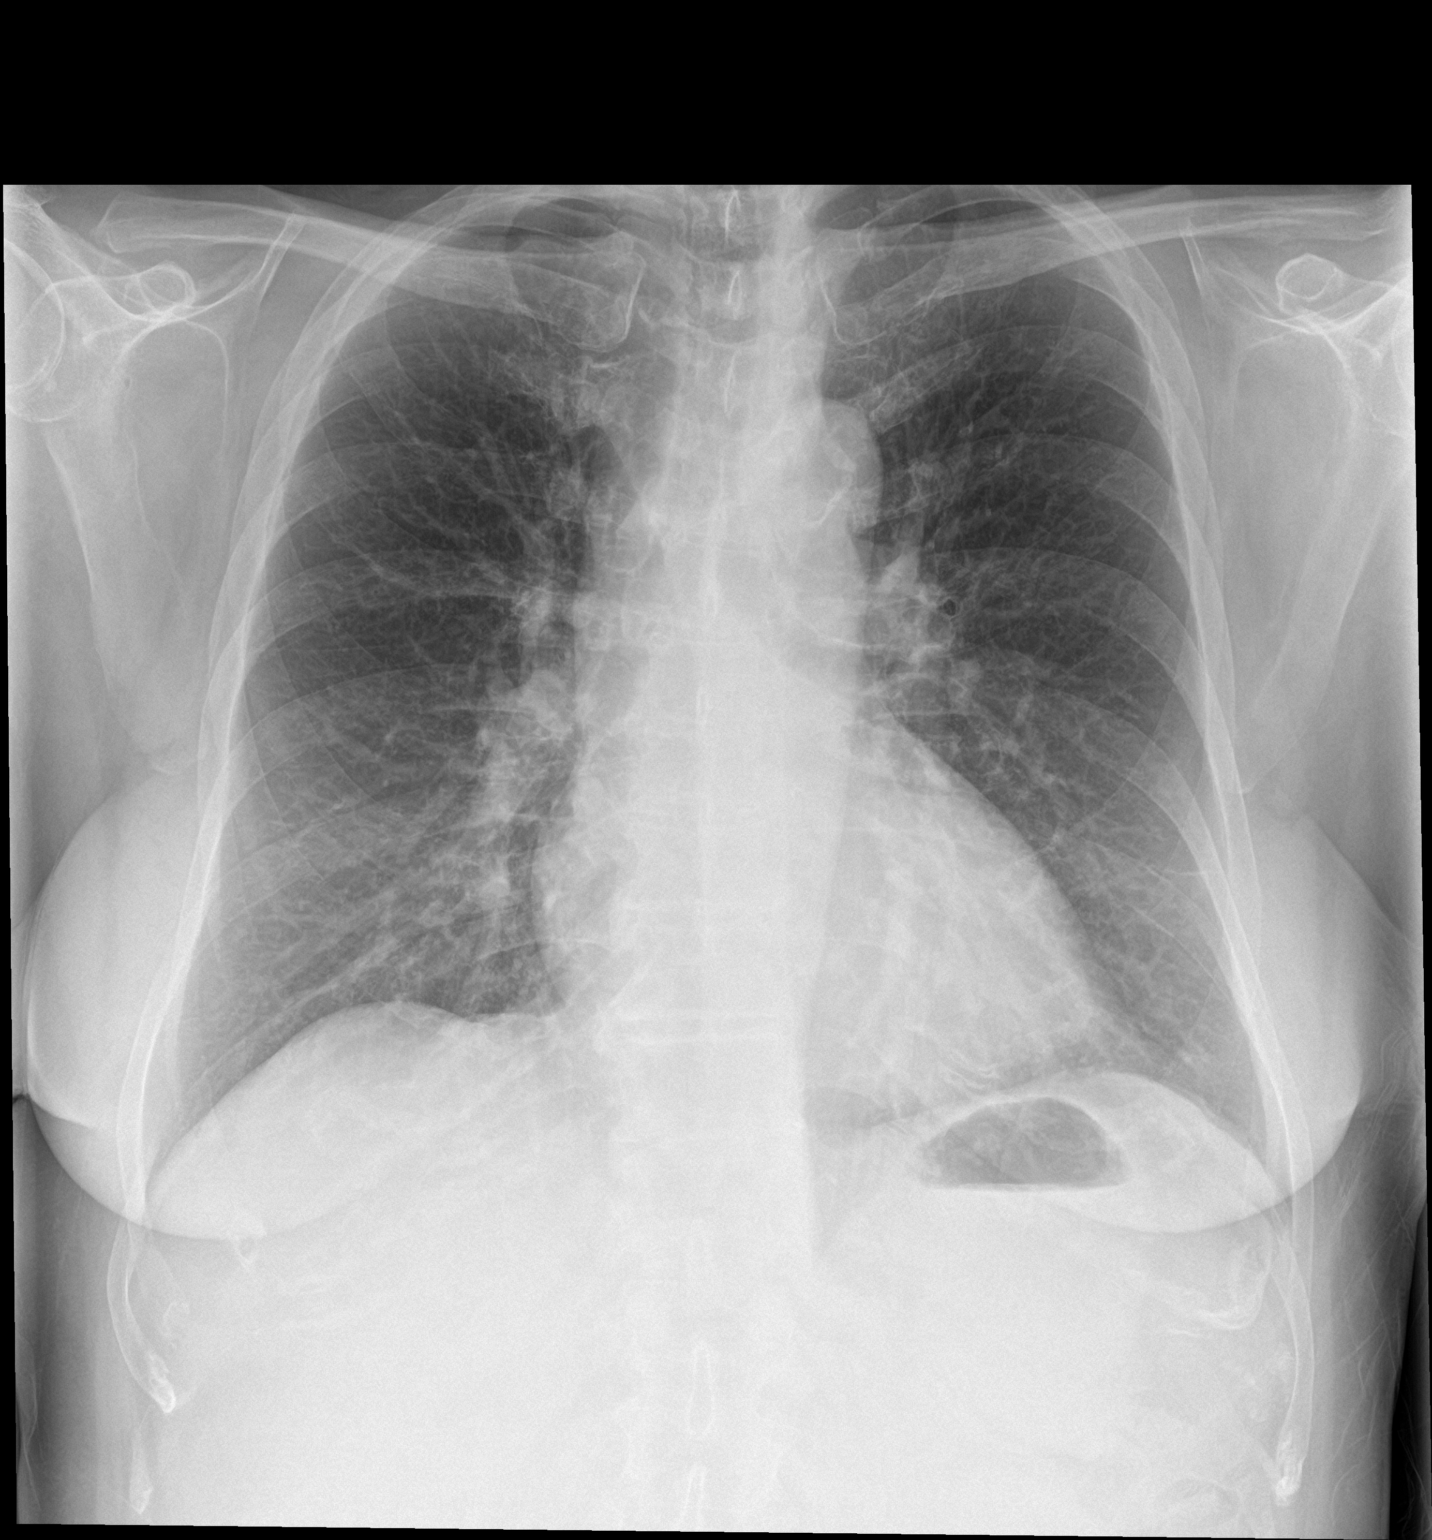

[chest lat]
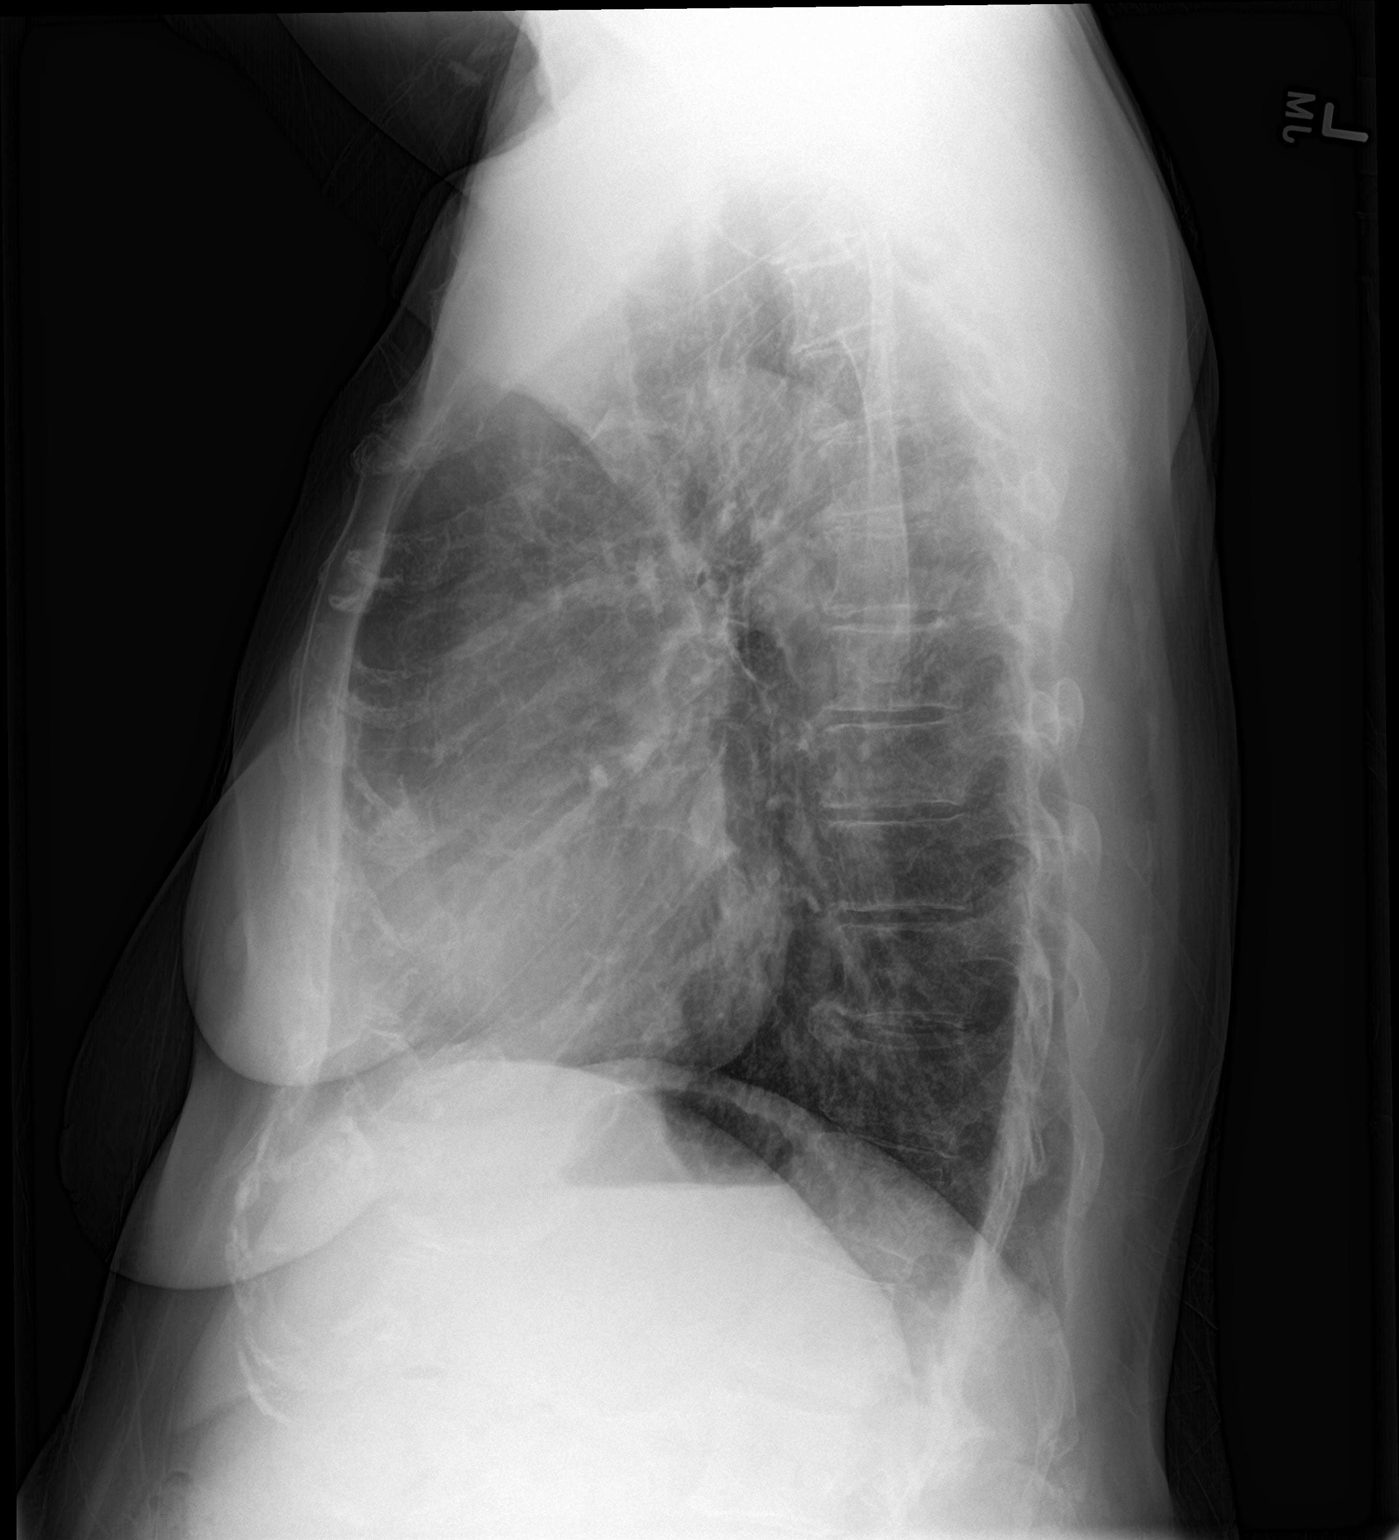

[2 of 2 positions shown; findings below may reference images not displayed]

FINDINGS: Cardiac shadow is stable. Aortic calcifications are again seen. The
lungs are well-aerated without focal infiltrate or sizable effusion.
Degenerative changes of the thoracic spine are noted.
IMPRESSION: No active cardiopulmonary disease.

## 2019-04-18 ENCOUNTER — Other Ambulatory Visit (INDEPENDENT_AMBULATORY_CARE_PROVIDER_SITE_OTHER): Payer: 59

## 2019-04-18 ENCOUNTER — Other Ambulatory Visit: Payer: Self-pay

## 2019-04-18 DIAGNOSIS — E890 Postprocedural hypothyroidism: Secondary | ICD-10-CM | POA: Diagnosis not present

## 2019-04-18 DIAGNOSIS — I10 Essential (primary) hypertension: Secondary | ICD-10-CM

## 2019-04-18 DIAGNOSIS — E119 Type 2 diabetes mellitus without complications: Secondary | ICD-10-CM | POA: Diagnosis not present

## 2019-04-18 DIAGNOSIS — E785 Hyperlipidemia, unspecified: Secondary | ICD-10-CM

## 2019-04-18 NOTE — Addendum Note (Signed)
Addended by: Kelle Darting A on: 04/18/2019 03:05 PM   Modules accepted: Orders

## 2019-04-19 ENCOUNTER — Encounter: Payer: Self-pay | Admitting: Family Medicine

## 2019-04-19 DIAGNOSIS — E785 Hyperlipidemia, unspecified: Secondary | ICD-10-CM

## 2019-04-19 LAB — COMPREHENSIVE METABOLIC PANEL
ALT: 14 U/L (ref 0–35)
AST: 15 U/L (ref 0–37)
Albumin: 4.2 g/dL (ref 3.5–5.2)
Alkaline Phosphatase: 77 U/L (ref 39–117)
BUN: 18 mg/dL (ref 6–23)
CO2: 28 mEq/L (ref 19–32)
Calcium: 9.3 mg/dL (ref 8.4–10.5)
Chloride: 105 mEq/L (ref 96–112)
Creatinine, Ser: 0.74 mg/dL (ref 0.40–1.20)
GFR: 77.64 mL/min (ref >60.00)
Glucose, Bld: 144 mg/dL — ABNORMAL HIGH (ref 70–99)
Potassium: 4 mEq/L (ref 3.5–5.1)
Sodium: 139 mEq/L (ref 135–145)
Total Bilirubin: 0.3 mg/dL (ref 0.2–1.2)
Total Protein: 6.7 g/dL (ref 6.0–8.3)

## 2019-04-19 LAB — HEMOGLOBIN A1C: Hgb A1c MFr Bld: 7.4 % — ABNORMAL HIGH (ref 4.6–6.5)

## 2019-04-19 LAB — MICROALBUMIN / CREATININE URINE RATIO
Creatinine,U: 37.1 mg/dL
Microalb Creat Ratio: 1.9 mg/g (ref 0.0–30.0)
Microalb, Ur: <0.7 mg/dL (ref 0.0–1.9)

## 2019-04-19 LAB — CBC
HCT: 39.5 % (ref 36.0–46.0)
Hemoglobin: 12.7 g/dL (ref 12.0–15.0)
MCHC: 32.1 g/dL (ref 30.0–36.0)
MCV: 81.1 fl (ref 78.0–100.0)
Platelets: 223 10*3/uL (ref 150.0–400.0)
RBC: 4.87 Mil/uL (ref 3.87–5.11)
RDW: 15.9 % — ABNORMAL HIGH (ref 11.5–15.5)
WBC: 6.5 10*3/uL (ref 4.0–10.5)

## 2019-04-19 LAB — LIPID PANEL
Cholesterol: 258 mg/dL — ABNORMAL HIGH (ref 0–200)
HDL: 49 mg/dL (ref >39.00)
LDL Cholesterol: 177 mg/dL — ABNORMAL HIGH (ref 0–99)
NonHDL: 208.95
Total CHOL/HDL Ratio: 5
Triglycerides: 158 mg/dL — ABNORMAL HIGH (ref 0.0–149.0)
VLDL: 31.6 mg/dL (ref 0.0–40.0)

## 2019-04-19 LAB — TSH: TSH: 2.7 u[IU]/mL (ref 0.35–4.50)

## 2019-04-19 NOTE — Progress Notes (Signed)
Received her labs, message to pt Last A1c was 6.5  Results for orders placed or performed in visit on 04/18/19  TSH  Result Value Ref Range   TSH 2.70 0.35 - 4.50 uIU/mL  Microalbumin / creatinine urine ratio  Result Value Ref Range   Microalb, Ur <0.7 0.0 - 1.9 mg/dL   Creatinine,U 37.1 mg/dL   Microalb Creat Ratio 1.9 0.0 - 30.0 mg/g  Lipid panel  Result Value Ref Range   Cholesterol 258 (H) 0 - 200 mg/dL   Triglycerides 158.0 (H) 0.0 - 149.0 mg/dL   HDL 49.00 >39.00 mg/dL   VLDL 31.6 0.0 - 40.0 mg/dL   LDL Cholesterol 177 (H) 0 - 99 mg/dL   Total CHOL/HDL Ratio 5    NonHDL 208.95   Hemoglobin A1c  Result Value Ref Range   Hgb A1c MFr Bld 7.4 (H) 4.6 - 6.5 %  Comprehensive metabolic panel  Result Value Ref Range   Sodium 139 135 - 145 mEq/L   Potassium 4.0 3.5 - 5.1 mEq/L   Chloride 105 96 - 112 mEq/L   CO2 28 19 - 32 mEq/L   Glucose, Bld 144 (H) 70 - 99 mg/dL   BUN 18 6 - 23 mg/dL   Creatinine, Ser 0.74 0.40 - 1.20 mg/dL   Total Bilirubin 0.3 0.2 - 1.2 mg/dL   Alkaline Phosphatase 77 39 - 117 U/L   AST 15 0 - 37 U/L   ALT 14 0 - 35 U/L   Total Protein 6.7 6.0 - 8.3 g/dL   Albumin 4.2 3.5 - 5.2 g/dL   Calcium 9.3 8.4 - 10.5 mg/dL   GFR 77.64 >60.00 mL/min  CBC  Result Value Ref Range   WBC 6.5 4.0 - 10.5 K/uL   RBC 4.87 3.87 - 5.11 Mil/uL   Platelets 223.0 150.0 - 400.0 K/uL   Hemoglobin 12.7 12.0 - 15.0 g/dL   HCT 39.5 36.0 - 46.0 %   MCV 81.1 78.0 - 100.0 fl   MCHC 32.1 30.0 - 36.0 g/dL   RDW 15.9 (H) 11.5 - 15.5 %

## 2019-04-20 DIAGNOSIS — H3550 Unspecified hereditary retinal dystrophy: Secondary | ICD-10-CM | POA: Diagnosis not present

## 2019-04-20 DIAGNOSIS — H35033 Hypertensive retinopathy, bilateral: Secondary | ICD-10-CM | POA: Diagnosis not present

## 2019-04-20 DIAGNOSIS — H26492 Other secondary cataract, left eye: Secondary | ICD-10-CM | POA: Diagnosis not present

## 2019-04-20 DIAGNOSIS — E119 Type 2 diabetes mellitus without complications: Secondary | ICD-10-CM | POA: Diagnosis not present

## 2019-04-20 DIAGNOSIS — H26493 Other secondary cataract, bilateral: Secondary | ICD-10-CM | POA: Diagnosis not present

## 2019-04-20 MED ORDER — FLUCONAZOLE 150 MG PO TABS: 150.0000 mg | ORAL_TABLET | Freq: Once | ORAL | 0 refills | Status: DC

## 2019-04-20 MED ORDER — FLUCONAZOLE 150 MG PO TABS: 150.0000 mg | ORAL_TABLET | Freq: Once | ORAL | 0 refills | Status: AC

## 2019-04-22 ENCOUNTER — Other Ambulatory Visit: Payer: Self-pay | Admitting: *Deleted

## 2019-04-22 MED ORDER — LOSARTAN POTASSIUM 50 MG PO TABS: 50.0000 mg | ORAL_TABLET | Freq: Every day | ORAL | 1 refills | Status: DC

## 2019-04-22 MED ORDER — REPATHA 140 MG/ML ~~LOC~~ SOSY: 140.0000 mg | PREFILLED_SYRINGE | SUBCUTANEOUS | 11 refills | Status: DC

## 2019-04-22 MED FILL — LOSARTAN POTASSIUM 50 MG TA: 50 | 90 days supply | Qty: 90 | Fill #0

## 2019-04-22 NOTE — Addendum Note (Signed)
Addended by: Lamar Blinks C on: 04/22/2019 07:13 AM   Modules accepted: Orders

## 2019-04-24 ENCOUNTER — Other Ambulatory Visit: Payer: Self-pay

## 2019-04-24 ENCOUNTER — Emergency Department
Admission: EM | Admit: 2019-04-24 | Discharge: 2019-04-24 | Disposition: A | Discharge status: Home / Self Care | Payer: 59 | Source: Home / Self Care | Attending: Family Medicine | Admitting: Family Medicine

## 2019-04-24 ENCOUNTER — Telehealth: Payer: 59 | Admitting: Family

## 2019-04-24 DIAGNOSIS — R399 Unspecified symptoms and signs involving the genitourinary system: Secondary | ICD-10-CM

## 2019-04-24 DIAGNOSIS — R300 Dysuria: Secondary | ICD-10-CM

## 2019-04-24 DIAGNOSIS — N3090 Cystitis, unspecified without hematuria: Secondary | ICD-10-CM

## 2019-04-24 LAB — POCT URINALYSIS DIP (MANUAL ENTRY)
Bilirubin, UA: NEGATIVE
Glucose, UA: 500 mg/dL — AB
Ketones, POC UA: NEGATIVE mg/dL
Nitrite, UA: POSITIVE — AB
Protein Ur, POC: NEGATIVE mg/dL
Spec Grav, UA: 1.01 (ref 1.010–1.025)
Urobilinogen, UA: 0.2 E.U./dL
pH, UA: 6 (ref 5.0–8.0)

## 2019-04-24 MED ORDER — CIPROFLOXACIN HCL 250 MG PO TABS: ORAL_TABLET | ORAL | 0 refills | Status: DC

## 2019-04-24 NOTE — ED Provider Notes (Signed)
Debbie [facility] CARE    CSN: [id number] Arrival date & time: 04/24/19  1319      History   Chief Complaint Chief Complaint  Patient presents with  . Urinary Tract Infection    HPI Debbie Hayes is a 68 y.o. female.   Patient has a history of recurrent UTI's and presently takes prophylactic Macrodantin '50mg'$  QHS.  Last week she developed dysuria and was prescribed cephalexin through an e-visit.  She improved but her symptoms recurred today.  She notes that she has had an intolerance (but not allergy) to Cipro in the past.  She denies fevers, chills, and sweats and feels well otherwise.  The history is provided by the patient.  Dysuria Pain quality:  Burning Pain severity:  Mild Onset quality:  Sudden Duration:  1 day Timing:  Constant Progression:  Unchanged Chronicity:  Recurrent Recent urinary tract infections: yes   Relieved by:  None tried Worsened by:  Nothing Ineffective treatments:  Antibiotics Urinary symptoms: frequent urination and hesitancy   Urinary symptoms: no discolored urine, no foul-smelling urine, no hematuria and no bladder incontinence   Associated symptoms: no abdominal pain, no fever, no flank pain, no nausea and no vomiting   Risk factors: recurrent urinary tract infections     Past Medical History:  Diagnosis Date  . Allergy   . Cancer [facility])    thyroid cancer  . Cataract   . Chronic migraine BOTOX INJECTION EVERY 3 MONTHS  . Chronic pain in right shoulder   . Constipation   . Diabetes mellitus   . Disorder of inner ear CAUSES VERTIGO OCCASIONALLY  . Fibromyalgia   . GERD (gastroesophageal reflux disease)   . Hemorrhoid   . History of bladder infections   . History of thyroid cancer 2009  S/P TOTAL THYROIDECTOMY AND RADIATION  . Hyperlipidemia   . Hypertension CARDIOLOGIST- DR Wynonia Lawman- WILL REQUEST LATE NOTE   DENIES S & S  . Hypothyroidism   . Insomnia   . Left shoulder pain   . Macular degeneration   . MRSA infection    nasal treated more than 15 years ago  . Normal nuclear stress test 01-25-2009  . OA (osteoarthritis) JOINTS  . Osteopenia   . Painful orthopaedic hardware ([place])    left foot  . PONV (postoperative nausea and vomiting) SEVERE  . Rotator cuff disorder LEFT SHOULDER RTC IMPINGMENT  . Sleep apnea    mouth guard, no cpap   . Sleep apnea in adult 06/26/2017  . Spondylolisthesis of lumbar region   . 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 Wears glasses     Patient Active Problem List   Diagnosis Date Noted  . Chronic neck pain 11/10/2018  . Chronic migraine with aura 12/15/2017  . Diabetes mellitus due to underlying condition with complication (Debbie Hayes) 66/29/4765  . 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 Arthralgia 12/15/2017  . OSA (obstructive sleep apnea) 06/26/2017  . Snoring 03/25/2016  . Palpitations 03/25/2016  . Trigeminal anesthesia 11/26/2015  . Spondylolisthesis of lumbar region 08/09/2014  . Back pain 02/17/2014  . Macular degeneration 02/17/2014  . Other and unspecified hyperlipidemia 10/14/2013  . Onychomycosis 12/01/2012  . Arthritis 04/12/2012  . Hypothyroid 04/12/2012    Past Surgical History:  Procedure Laterality Date  . BACK SURGERY     Dr Arnoldo Morale- spondylosis  . BILATERAL CARPAL TUNNEL RELEASE  1994  . BILATERAL ELBOW SURG.  1999  . [place]  POST-OP URETER REPAIR 12 DAYS AFTER   . COLONOSCOPY    . FOOT ARTHRODESIS Left 07/10/2016   Procedure: LEFT 2ND TARSAL METATARSAL ARTHRODESIS  GASTROC RECESSION LEFT LAPIDUS MODIFIED MCBRIDE BUIONECTOMY;  Surgeon: Wylene Simmer, MD;  Location: [place];  Service: Orthopedics;  Laterality: Left;  Marland Kitchen GASTROC RECESSION EXTREMITY Left 07/10/2016   Procedure: GASTROC RECESSION EXTREMITY;  Surgeon: Wylene Simmer, MD;  Location: [place];  Service: Orthopedics;  Laterality: Left;  . HARDWARE REMOVAL Left 05/20/2018    Procedure: Left foot removal of deep implants;  Surgeon: Wylene Simmer, MD;  Location: [place];  Service: Orthopedics;  Laterality: Left;  9mn  . LEFT THUMB JOINT REPLACEMENT  2005   RIGHT DONE IN 2004  . OOPHORECTOMY    . POLYPECTOMY    . RIGHT KNEE ARTHROSCOPY  X2  BEFORE 2011  . RIGHT KNEE ARTHROSCOPY/ PARTIAL LATERAL MENISECTOMY/ TRICOMPARTMENT CHONDROPLASTY/ DECOMPRESSION CYST  10-26-2009  . RIGHT KNEE CLOSED MANIPULATION  09-11-2010  . RIGHT SHOULDER ARTHROSCOPY  2010  &  2004  . TIBIA CYST REMOVED AND ORIF LEG FX  1958  . TONSILLECTOMY  1968  . TOTAL KNEE ARTHROPLASTY  07-16-2010   RIGHT  . TOTAL THYROIDECTOMY  2009   CANCER  (POST-OP BLEED)  AND RADIATION [place]  . trigger fingers Right 2013   3rd and 4th fingers  . UPPER GASTROINTESTINAL ENDOSCOPY    . VAGINAL HYSTERECTOMY  1990    OB History   No obstetric history on file.      Home Medications    Prior to Admission medications   Medication Sig Start Date End Date Taking? Authorizing Provider  acetaminophen (TYLENOL) 500 MG tablet Take 500 mg by mouth every 6 (six) hours as needed.    [provider]  benzonatate (TESSALON) 200 MG capsule Take 1 capsule (200 mg total) by mouth 2 (two) times daily as needed for cough. 08/09/18   Copland, JGay Filler MD  blood glucose meter kit and supplies KIT Test blood sugar once daily. Dx code: E11.9 03/08/15   DDarlyne Russian MD  Blood Glucose Monitoring Suppl (FREESTYLE LITE) DEVI Check blood sugar no more than twice daily 09/23/17   Copland, JGay Filler MD  cephALEXin (KEFLEX) 500 MG capsule Take 1 capsule (500 mg total) by mouth 2 (two) times daily. 04/09/19   WKennyth Arnold FNP  cetirizine (ZYRTEC) 10 MG tablet Take 10 mg by mouth daily as needed for allergies.     [provider]  ciclopirox (PENLAC) 8 % solution APPLY TO NAIL ONCE A DAY AND SURROUNDING SKIN. AFTER 7 DAYS REMOVE WITH ALCOHOL AND CONTINUE USE FOR UP TO 48 WEEKS 12/13/18   Copland,  JGay Filler MD  ciprofloxacin (CIPRO) 250 MG tablet Take one tab PO, once daily 04/24/19   BKandra Nicolas MD  dapagliflozin propanediol (FARXIGA) 10 MG TABS tablet Take 10 mg by mouth daily. 11/29/18   Copland, JGay Filler MD  diclofenac (FLECTOR) 1.3 % PTCH PLACE 1 PATCH ONTO THE SKIN 2 TIMES DAILY. 04/13/18   Copland, JGay Filler MD  esomeprazole (NEXIUM) 40 MG capsule Take 40 mg by mouth daily at 12 noon.    [provider]  Evolocumab (REPATHA) 140 MG/ML SOSY Inject 140 mg into the skin every 14 (fourteen) days. 04/22/19   Copland, JGay Filler MD  glucose blood (FREESTYLE LITE) test strip CHECK BLOOD SUGARS NO MORE THAN TWICE DAILY 10/26/18   Copland, JGay Filler MD  [facility]  100 MG tablet TAKE 1 TABLET BY MOUTH DAILY. 01/11/19   Copland, Gay Filler, MD  Lancets (FREESTYLE) lancets Check blood sugars no more than twice daily 09/23/17   Copland, Gay Filler, MD  levothyroxine (SYNTHROID) 125 MCG tablet TAKE 1 TABLET (125 MCG TOTAL) BY MOUTH DAILY BEFORE BREAKFAST. 01/31/19   Copland, Gay Filler, MD  losartan (COZAAR) 50 MG tablet Take 1 tablet (50 mg total) by mouth daily. 04/22/19   Copland, Gay Filler, MD  metaxalone (SKELAXIN) 800 MG tablet TAKE 1 TABLET BY MOUTH 3 TIMES DAILY. 03/02/18   Copland, Gay Filler, MD  metoprolol succinate (TOPROL-XL) 25 MG 24 hr tablet Take 1 tablet (25 mg total) by mouth daily. Take with or immediately following a meal. 10/06/18   Copland, Gay Filler, MD  minoxidil (ROGAINE) 2 % external solution Apply topically 2 (two) times daily.    [provider]  montelukast (SINGULAIR) 10 MG tablet TAKE 1 TABLET BY MOUTH AT BEDTIME. 02/01/18   Copland, Gay Filler, MD  Multiple Vitamins-Minerals (ICAPS AREDS 2) CAPS Take by mouth.    [provider]  naproxen sodium (ALEVE) 220 MG tablet Take 440 mg by mouth.    [provider]  NASONEX 50 MCG/ACT nasal spray PLACE 2 SPRAYS INTO THE NOSE DAILY. 09/20/14   [name], MD  neomycin-polymyxin-hydrocortisone  (CORTISPORIN) OTIC solution Apply 1-2 drops to toe after soaking twice a day 01/03/19   Wallene Huh, DPM  nitrofurantoin (MACRODANTIN) 50 MG capsule Take 1 capsule (50 mg total) by mouth at bedtime. 10/06/18   Copland, Gay Filler, MD  omega-3 acid ethyl esters (LOVAZA) 1 g capsule Take 2 capsules (2 g total) by mouth 2 (two) times daily. 10/06/18   Copland, Gay Filler, MD  ondansetron (ZOFRAN) 4 MG tablet TAKE 1 TABLET BY MOUTH EVERY 8 HOURS AS NEEDED FOR NAUSEA AND VOMITING 04/13/18   Copland, Gay Filler, MD  oxyCODONE HCl 7.5 MG TABA Take 1 every 8 hours as needed for severe pain 02/23/19   Copland, Gay Filler, MD  oxyCODONE-acetaminophen (PERCOCET) 7.5-325 MG tablet Take 1 tablet by mouth every 8 (eight) hours as needed for severe pain. 09/02/18   Copland, Gay Filler, MD  rizatriptan (MAXALT) 10 MG tablet TAKE 1 TABLET BY MOUTH AS NEEDED. MAY REPEAT IN 2 HOURS IF NEEDED 09/21/18   Dohmeier, Asencion Partridge, MD  SUMAtriptan (IMITREX) 100 MG tablet Take 1 tablet (100 mg total) by mouth once for 1 dose. May repeat in 2 hours if headache persists or recurs. 01/06/18 05/12/18  Copland, Gay Filler, MD  topiramate ER (QUDEXY XR) 100 MG CS24 sprinkle capsule Take 1 capsule (100 mg total) by mouth at bedtime. 02/22/19   Melvenia Beam, MD  traZODone (DESYREL) 100 MG tablet TAKE 2.5 TABLETS BY MOUTH AT BEDTIME. 02/14/19   Copland, Gay Filler, MD  Ubrogepant (UBRELVY) 50 MG TABS Take 50 mg by mouth every 2 (two) hours as needed. Max '200mg'$  a day 11/10/18   Melvenia Beam, MD  zolpidem (AMBIEN) 5 MG tablet TAKE 1/2 TABLET BY MOUTH AT BEDTIME AS NEEDED FOR SLEEP 11/19/18   Copland, Gay Filler, MD    Family History Family History  Problem Relation Age of Onset  . Colon polyps Sister 55  . Alzheimer's disease Mother   . Heart disease Father   . Cirrhosis Father   . Heart attack Father   . Macular degeneration Father   . Colon cancer Neg Hx     Social History Social History  Tobacco Use  . Smoking status: Never Smoker  .  Smokeless tobacco: Never Used  Substance Use Topics  . Alcohol use: Yes    Comment: RARE  . Drug use: No     Allergies   Ciprofloxacin, Statins, and Sulfa antibiotics   Review of Systems Review of Systems  Constitutional: Negative for fever.  Gastrointestinal: Negative for abdominal pain, nausea and vomiting.  Genitourinary: Positive for dysuria and frequency. Negative for flank pain and pelvic pain.  All other systems reviewed and are negative.    Physical Exam Triage Vital Signs ED Triage Vitals  Enc Vitals Group     BP 04/24/19 1354 120/79     Pulse Rate 04/24/19 1354 66     Resp 04/24/19 1354 16     Temp 04/24/19 1354 97.6 F (36.4 C)     Temp Source 04/24/19 1354 Oral     SpO2 04/24/19 1354 98 %     Weight 04/24/19 1355 182 lb (82.6 kg)     Height 04/24/19 1355 '5\' 8"'$  (1.727 m)     Head Circumference --      Peak Flow --      Pain Score 04/24/19 1355 0     Pain Loc --      Pain Edu? --      Excl. in [place]? --    No data found.  Updated Vital Signs BP 120/79 (BP Location: Right Arm)   Pulse 66   Temp 97.6 F (36.4 C) (Oral)   Resp 16   Ht '5\' 8"'$  (1.727 m)   Wt 82.6 kg   SpO2 98%   BMI 27.67 kg/m   Visual Acuity Right Eye Distance:   Left Eye Distance:   Bilateral Distance:    Right Eye Near:   Left Eye Near:    Bilateral Near:     Physical Exam Nursing notes and Vital Signs reviewed. Appearance:  Patient appears stated age, and in no acute distress.    Eyes:  Pupils are equal, round, and reactive to light and accomodation.  Extraocular movement is intact.  Conjunctivae are not inflamed   Pharynx:  Normal; moist mucous membranes  Neck:  Supple.  No adenopathy Lungs:  Clear to auscultation.  Breath sounds are equal.  Moving air well. Heart:  Regular rate and rhythm without murmurs, rubs, or gallops.  Abdomen:  Nontender without masses or hepatosplenomegaly.  Bowel sounds are present.  No CVA or flank tenderness.  Extremities:  No edema.  Skin:   No rash present.     UC Treatments / Results  Labs (all labs ordered are listed, but only abnormal results are displayed) Labs Reviewed  POCT URINALYSIS DIP (MANUAL ENTRY) - Abnormal; Notable for the following components:      Result Value   Glucose, UA =500 (*)    Blood, UA moderate (*)    Nitrite, UA Positive (*)    Leukocytes, UA Small (1+) (*)    All other components within normal limits  URINE CULTURE    EKG   Radiology No results found.  Procedures Procedures (including critical care time)  Medications Ordered in UC Medications - No data to display  Initial Impression / Assessment and Plan / UC Course  I have reviewed the triage vital signs and the nursing notes.  Pertinent labs & imaging results that were available during my care of the patient were reviewed by me and considered in my medical decision making (see chart for details).  Begin a 3 to 5 day trial of low dose Cipro '250mg'$  once daily.  Urine culture pending.  May stop after 3 days if symptoms resolved. Followup with Family Doctor if not improved in 5 days if not improved.   Final Clinical Impressions(s) / UC Diagnoses   Final diagnoses:  Dysuria  Cystitis     Discharge Instructions     Continue increased fluid intake.  If symptoms become significantly worse during the night or over the weekend, proceed to the local emergency room.    ED Prescriptions    Medication Sig Dispense Auth. Provider   ciprofloxacin (CIPRO) 250 MG tablet Take one tab PO, once daily 5 tablet Kandra Nicolas, MD        Kandra Nicolas, MD 04/26/19 [phone]

## 2019-04-24 NOTE — ED Triage Notes (Signed)
Pt c/o dysuria. Did a virtual visit last week and was rx'd Cephalexin. Finished rx last week. Sxs have returned today.

## 2019-04-24 NOTE — Discharge Instructions (Addendum)
Continue increased fluid intake.  If symptoms become significantly worse during the night or over the weekend, proceed to the local emergency room.

## 2019-04-24 NOTE — Progress Notes (Signed)
Based on what you shared with me, I feel your condition warrants further evaluation and I recommend that you be seen for a face to face office visit.  After reviewing your chart, it looks like you were treated for a UTI on 04/09/19 and since your symptoms have returned or not resolved, you need to be seen face to face for urine culture.   NOTE: If you entered your credit card information for this eVisit, you will not be charged. You may see a "hold" on your card for the $35 but that hold will drop off and you will not have a charge processed.  If you are having a true medical emergency please call 911.     For an urgent face to face visit, [facility] has four urgent care centers for your convenience:   . [facility]    [phone]                  Get Driving Directions  [phone] [place], [place] [address] . 10 am to 8 pm Monday-Friday . 12 pm to 8 pm Saturday-Sunday   . [facility] at [place]                  Get Driving Directions  [phone] [place], [place], [place] [address] . 8 am to 8 pm Monday-Friday . 9 am to 6 pm Saturday . 11 am to 6 pm Sunday   . [facility] at [place]                  Get Driving Directions   [address] [place], [place] [address] . 8 am to 8 pm Monday-Friday . 8 am to 4 pm Saturday-Sunday    . [facility] at [place]                    Get Driving Directions  [id number]  [address], [place],  [address]  . Monday-Friday, 12 PM to 6 PM    Your e-visit answers were reviewed by a board certified advanced clinical practitioner to complete your personal care plan.  Thank you for using e-Visits.

## 2019-04-25 ENCOUNTER — Telehealth: Payer: Self-pay

## 2019-04-25 MED FILL — UBRELVY 50 MG TABS: 50 | 30 days supply | Qty: 10 | Fill #3

## 2019-04-25 NOTE — Telephone Encounter (Signed)
PA initiated via Covermymeds; KEY: [id number]. Awaiting determination.

## 2019-04-26 IMAGING — DX DG HIP (WITH OR WITHOUT PELVIS) 2-3V*L*
3 series · 3 of 3 positions shown · non-contrast
Comparison: None.

CLINICAL DATA: Left hip pain for 2 months, no known injury, initial
encounter

EXAM:
DG HIP (WITH OR WITHOUT PELVIS) 2-3V LEFT

[pelvis ap]
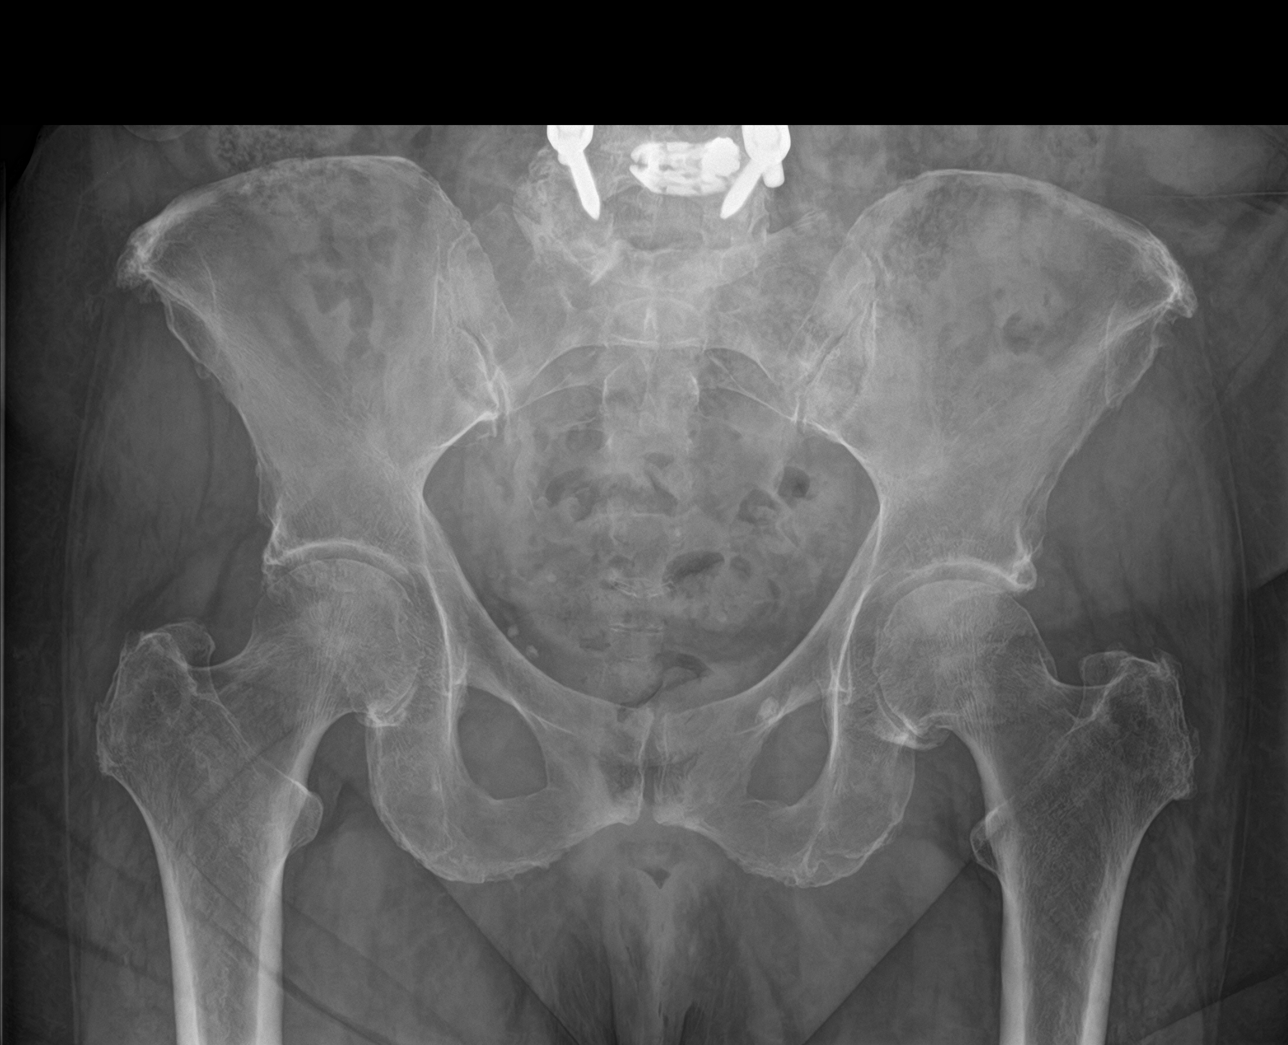

[hip ap]
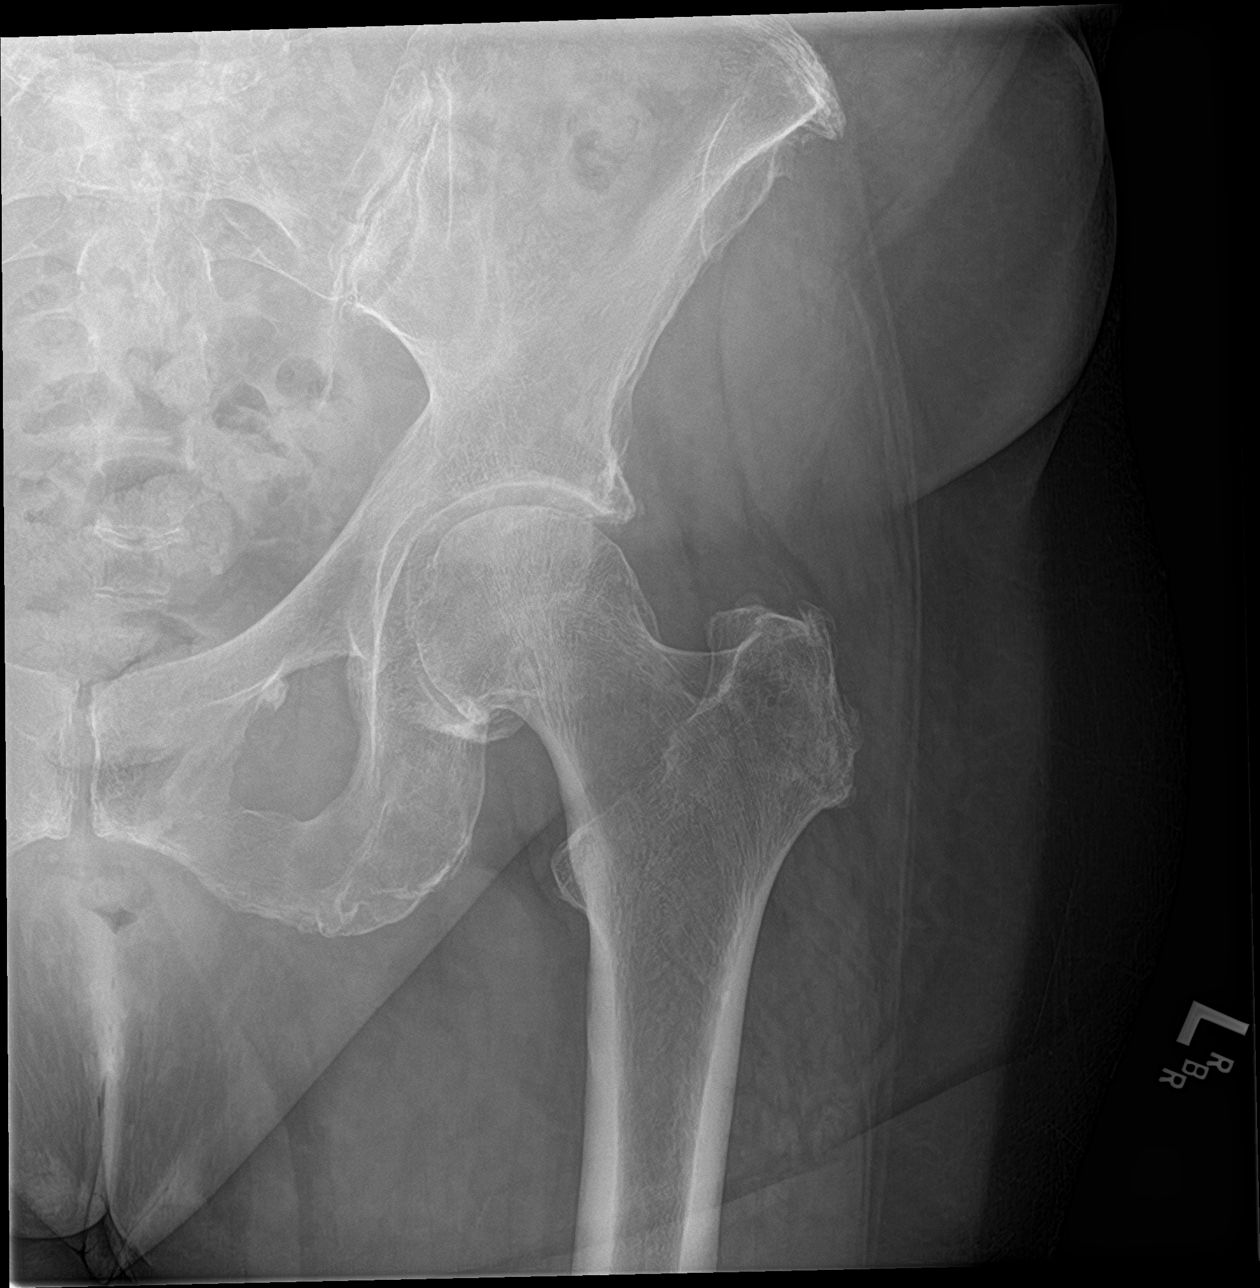

[hip lat]
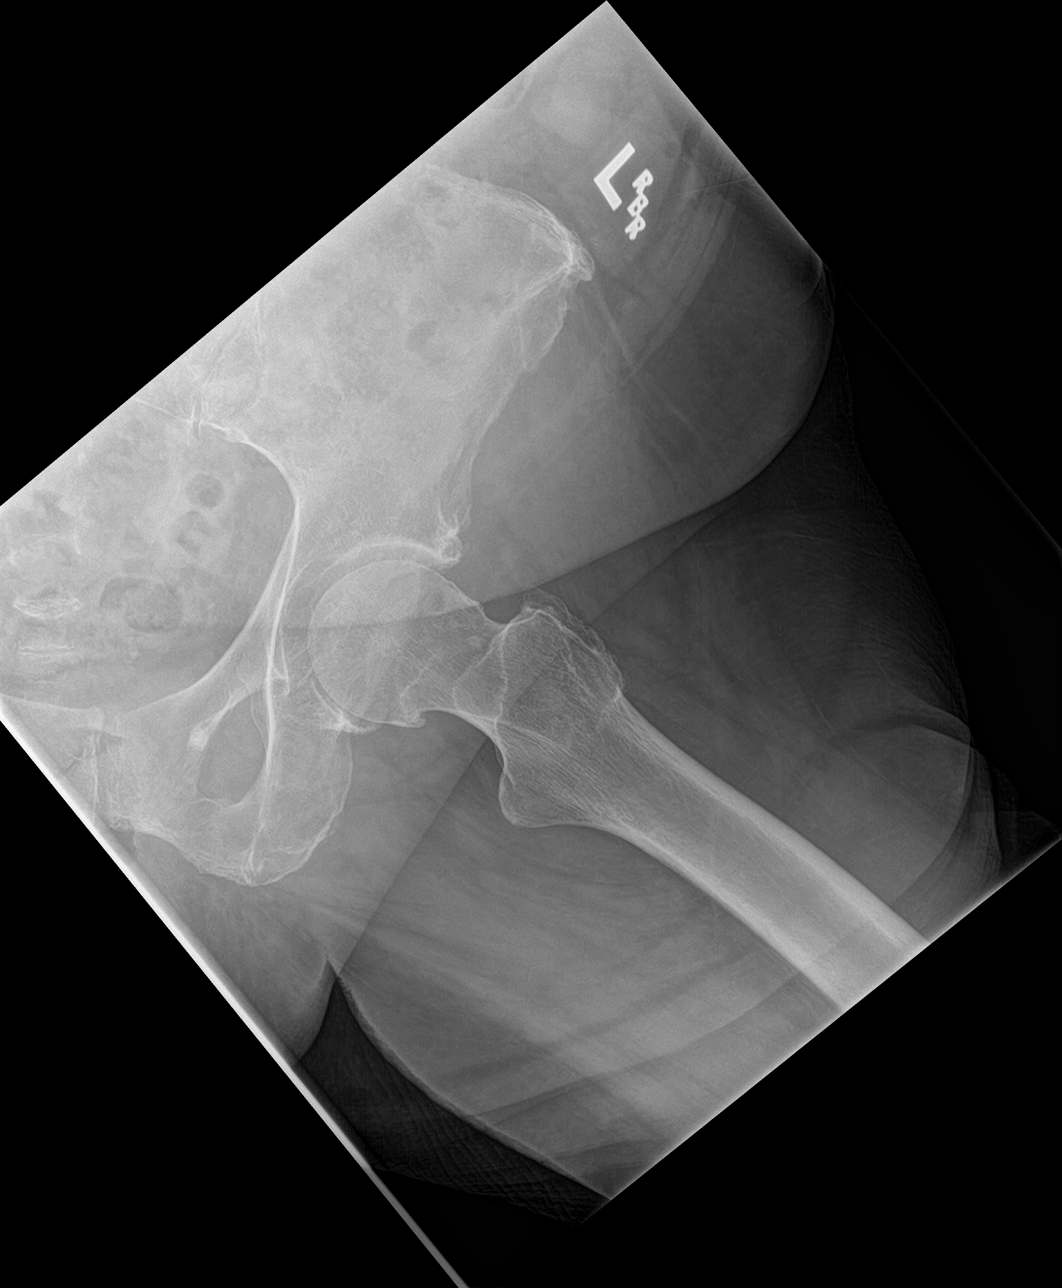

[3 of 3 positions shown; findings below may reference images not displayed]

FINDINGS: The pelvic ring is intact. Degenerative changes of the hip joints
are noted bilaterally. Postsurgical changes are noted in the lower
lumbar spine. No acute soft tissue abnormality is seen.
IMPRESSION: Degenerative change without acute abnormality.

## 2019-04-27 LAB — URINE CULTURE
MICRO NUMBER:: 927926
SPECIMEN QUALITY:: ADEQUATE

## 2019-04-27 MED FILL — LEVOTHYROXINE 125 MCG TABLE: 125 | 90 days supply | Qty: 90 | Fill #1

## 2019-04-27 NOTE — Progress Notes (Signed)
[place], [place] - [address] [place] [address] Phone: [phone] Fax: [phone]  [id number] - [place], [place] [address] [place] [address] Phone: [phone] Fax: [place] [id number] - [place], [address] AT [facility] OF [place] [address] Phone: [phone] Fax: [place], [place]. [place]. [place] [address] Phone: [phone] Fax: [place], [place] - [address] [facility]. [address] [place] [address] Phone: [phone] Fax: [phone]      Your procedure is scheduled on Monday 05/02/2019.  Report to [facility] Main Entrance "A" at 09:45 A.M., and check in at the Admitting office.  Call this number if you have problems the morning of surgery:  [phone]  Call [phone] if you have any questions prior to your surgery date Monday-Friday 8am-4pm    Remember:  Do not eat or drink after midnight the night before your surgery    Take these medicines the morning of surgery with A SIP OF WATER: Acetaminophen (Tylenol) - if needed Cetirizine (Zyrtec) Esomeprazole (Nexium) - if needed Levothyroxine (Synthroid) Metoprolol succinate (Toprol-XL) Nasonex nasal spray Ondansetron (Zofran) - if needed Oxycodone HCl - if needed Topiramate ER (Qudexy XR) - if needed Ubrogepant Roselyn Meier) - if needed  7 days prior to surgery STOP taking any Diclofenac (Flector) patch, Aspirin (unless otherwise instructed by your surgeon), Aleve, Naproxen, Ibuprofen, Motrin, Advil, Goody's, BC's, all herbal medications, fish oil, and all vitamins.   WHAT DO I DO ABOUT MY DIABETES MEDICATION?   The day before surgery, DO NOT take your Wilder Glade (dapagliflozin  propanediol)   The day of surgery, DO NOT take any oral diabetes medications, including your Farxiga (dapagliflozin propanediol) and Januvia (Sitagliptin)    How to Manage Your Diabetes Before and After Surgery  Why is it important to control my blood sugar before and after surgery? . Improving blood sugar levels before and after surgery helps healing and can limit problems. . A way of improving blood sugar control is eating a healthy diet by: o  Eating less sugar and carbohydrates o  Increasing activity/exercise o  Talking with your doctor about reaching your blood sugar goals . High blood sugars (greater than 180 mg/dL) can raise your risk of infections and slow your recovery, so you will need to focus on controlling your diabetes during the weeks before surgery. . Make sure that the doctor who takes care of your diabetes knows about your planned surgery including the date and location.  How do I manage my blood sugar before surgery? . Check your blood sugar at least 4 times a day, starting 2 days before surgery, to make sure that the level is not too high or low. o Check your blood sugar the morning of your surgery when you wake up and every 2 hours until you get to the Short Stay unit. . If your blood sugar is less than 70 mg/dL, you will need to treat for low blood sugar: o Do not take insulin. o Treat a low blood sugar (less than 70 mg/dL) with  cup of clear juice (cranberry or apple), 4 glucose tablets, OR glucose gel. o Recheck blood sugar in 15 minutes after treatment (to make sure it is greater than 70 mg/dL).  If your blood sugar is not greater than 70 mg/dL on recheck, call [phone] for further instructions. . Report your blood sugar to the short stay nurse when you get to Short Stay.  . If you are admitted to the hospital after surgery: o Your blood sugar will be checked by the staff and you will probably be given insulin after surgery (instead of oral diabetes  medicines) to make sure you have good blood sugar levels. o The goal for blood sugar control after surgery is 80-180 mg/dL.     The Morning of Surgery  Do not wear jewelry, make-up or nail polish.  Do not wear lotions, powders, perfumes, or deodorant  Do not shave 48 hours prior to surgery.    Do not bring valuables to the hospital.  [facility] is not responsible for any belongings or valuables.  If you are a smoker, DO NOT Smoke 24 hours prior to surgery  If you wear a CPAP at night please bring your mask, tubing, and machine the morning of surgery   Remember that you must have someone to transport you home after your surgery, and remain with you for 24 hours if you are discharged the same day.   Contacts, eyeglasses, hearing aids, dentures or bridgework may not be worn into surgery.    Leave your suitcase in the car.  After surgery it may be brought to your room.  For patients admitted to the hospital, discharge time will be determined by your treatment team.  Patients discharged the day of surgery will not be allowed to drive home.    Special instructions:   [facility]- Preparing For Surgery  Before surgery, you can play an important role. Because skin is not sterile, your skin needs to be as free of germs as possible. You can reduce the number of germs on your skin by washing with CHG (chlorahexidine gluconate) Soap before surgery.  CHG is an antiseptic cleaner which kills germs and bonds with the skin to continue killing germs even after washing.    Oral Hygiene is also important to reduce your risk of infection.  Remember - BRUSH YOUR TEETH THE MORNING OF SURGERY WITH YOUR REGULAR TOOTHPASTE  Please do not use if you have an allergy to CHG or antibacterial soaps. If your skin becomes reddened/irritated stop using the CHG.  Do not shave (including legs and underarms) for at least 48 hours prior to first CHG shower. It is OK to shave your face.  Please follow these  instructions carefully.   1. Shower the NIGHT BEFORE SURGERY and the MORNING OF SURGERY with CHG Soap.   2. If you chose to wash your hair, wash your hair first as usual with your normal shampoo.  3. After you shampoo, rinse your hair and body thoroughly to remove the shampoo.  4. Use CHG as you would any other liquid soap. You can apply CHG directly to the skin and wash gently with a scrungie or a clean washcloth.   5. Apply the CHG Soap to your body ONLY FROM THE NECK DOWN.  Do not use on open wounds or open sores. Avoid contact with your eyes, ears, mouth and genitals (private parts). Wash Face and genitals (private parts)  with your normal soap.   6. Wash thoroughly, paying special attention to the area where your surgery will be performed.  7. Thoroughly rinse your body with warm water from the neck down.  8. DO NOT shower/wash with your normal soap after  using and rinsing off the CHG Soap.  9. Pat yourself dry with a CLEAN TOWEL.  10. Wear CLEAN PAJAMAS to bed the night before surgery, wear comfortable clothes the morning of surgery  11. Place CLEAN SHEETS on your bed the night of your first shower and DO NOT SLEEP WITH PETS.    Day of Surgery:  Please shower the morning of surgery with the CHG soap Do not apply any deodorants/lotions.  Please wear clean clothes to the hospital/surgery center.   Remember to brush your teeth WITH YOUR REGULAR TOOTHPASTE.   Please read over the following fact sheets that you were given.

## 2019-04-28 ENCOUNTER — Encounter (HOSPITAL_COMMUNITY): Payer: Self-pay

## 2019-04-28 ENCOUNTER — Other Ambulatory Visit (HOSPITAL_COMMUNITY)
Admission: RE | Admit: 2019-04-28 | Discharge: 2019-04-28 | Disposition: A | Discharge status: Home / Self Care | Payer: 59 | Source: Ambulatory Visit | Attending: Neurosurgery | Admitting: Neurosurgery

## 2019-04-28 ENCOUNTER — Other Ambulatory Visit: Payer: Self-pay | Admitting: Neurosurgery

## 2019-04-28 ENCOUNTER — Other Ambulatory Visit: Payer: Self-pay

## 2019-04-28 ENCOUNTER — Encounter (HOSPITAL_COMMUNITY)
Admission: RE | Admit: 2019-04-28 | Discharge: 2019-04-28 | Disposition: A | Discharge status: Home / Self Care | Payer: 59 | Source: Ambulatory Visit | Attending: Neurosurgery | Admitting: Neurosurgery

## 2019-04-28 ENCOUNTER — Other Ambulatory Visit (HOSPITAL_COMMUNITY): Payer: 59

## 2019-04-28 DIAGNOSIS — Z20828 Contact with and (suspected) exposure to other viral communicable diseases: Secondary | ICD-10-CM | POA: Diagnosis not present

## 2019-04-28 DIAGNOSIS — Z01818 Encounter for other preprocedural examination: Secondary | ICD-10-CM | POA: Diagnosis not present

## 2019-04-28 LAB — URINALYSIS, ROUTINE W REFLEX MICROSCOPIC
Bacteria, UA: NONE SEEN
Bilirubin Urine: NEGATIVE
Glucose, UA: >=500 mg/dL — AB
Hgb urine dipstick: NEGATIVE
Ketones, ur: NEGATIVE mg/dL
Leukocytes,Ua: NEGATIVE
Nitrite: NEGATIVE
Protein, ur: NEGATIVE mg/dL
Specific Gravity, Urine: 1.021 (ref 1.005–1.030)
pH: 8 (ref 5.0–8.0)

## 2019-04-28 LAB — GLUCOSE, CAPILLARY: Glucose-Capillary: 125 mg/dL — ABNORMAL HIGH (ref 70–99)

## 2019-04-28 LAB — SURGICAL PCR SCREEN
MRSA, PCR: NEGATIVE
Staphylococcus aureus: NEGATIVE

## 2019-04-28 LAB — TYPE AND SCREEN
ABO/RH(D): O POS
Antibody Screen: NEGATIVE

## 2019-04-28 NOTE — Telephone Encounter (Signed)
PA denied. Denial information given to Dr. Copland.  

## 2019-04-28 NOTE — Progress Notes (Addendum)
PCP: Dr. Janett Billow Copland Cardiologist: denies  EKG: today--forwarded chart to anesthesia for review CXR: n/a ECHO: denies Stress Test: July 2010 Cardiac Cath: denies  Fasting Blood Sugar- 130's-140's Checks Blood Sugar weekly Last A1C 7.4  CBC, CMET, A1C completed on 04/18/2019--within date for scheduled surgery.  Called surgeon's office to make aware pt had a recent UTI, finished Keflex 7 day course, did not work, went to [facility], received low-dose Cipro (thought previously to have been allergic, but tolerated most recent dosage well) took 3 days worth and feels much better. Verbal order received to repeat UA and Urine culture today--pt asked I call her to make aware of any abnormal results because Urgent Care MD asked she continue Cipro if so.  I will update pt with any abnormal results.  Informed pt that I would also notify surgeon's office of results.    Patient denies shortness of breath, fever, cough, and chest pain at PAT appointment.  Patient verbalized understanding of instructions provided today at the PAT appointment.  Patient asked to review instructions at home and day of surgery.    UPDATE: called patient @ 12:41 to make aware of UA results.  No longer having UTI symptoms, UA much cleaner than previous sample after completing Cipro.  Urine cx still pending.

## 2019-04-29 ENCOUNTER — Encounter: Payer: Self-pay | Admitting: Family Medicine

## 2019-04-29 LAB — NOVEL CORONAVIRUS, NAA (HOSP ORDER, SEND-OUT TO REF LAB; TAT 18-24 HRS): SARS-CoV-2, NAA: NOT DETECTED

## 2019-04-29 LAB — URINE CULTURE: Culture: NO GROWTH

## 2019-04-29 MED FILL — TOPIRAMATE ER 100 MG CAP: 100 | 30 days supply | Qty: 30 | Fill #4

## 2019-04-29 NOTE — Progress Notes (Signed)
Anesthesia Chart Review:  Case: [id number] Date/Time: 05/02/19 1139   Procedure: POSTERIOR LUMBAR INTERBODY FUSION LUMBAR 3- LUMBAR 4; EXPLORE FUSION (N/A ) - POSTERIOR LUMBAR INTERBODY FUSION LUMBAR 3- LUMBAR 4; EXPLORE FUSION   Anesthesia type: General   Pre-op diagnosis: LUMBAR ADJACENT SEGMENT DISEASE WITH SPONDYLOLISTHESIS   Location: [facility] OR ROOM 33 / [place] OR   Surgeon: Newman Pies, MD      DISCUSSION: Patient is a 68 year old female scheduled for the above procedure.  History includes never smoker, post-operative N/V, HLD, migraines (s/p Botox), fibromyalgia, GERD, thyroid cancer (s/p thyroidectomy, post-surgical hypothyroidism), OSA (wears mouth guard, no CPAP), TMJ, DM2, HTN, OSA, macular degeneration, L4-5 fusion 08/09/14.  Notes indicate that she is the wife of Dr. Ruben Reason.  Had some labs through primary care 04/18/19. Follow-up 04/28/19 urine culture negative Lexine Baton at Dr. Arnoldo Morale' office notified). EKG showed non-specific ST/T wave abnormality with known incomplete RBBB. She denied chest pain, SOB at PAT RN visit. 04/28/19 COVID-19 test negative. Based on currently available information, I would anticipate that she can proceed as planned if no acute changes.   VS: BP 112/66   Pulse 74   Temp 36.5 C   Resp 18   Ht _0  (1.727 m)   Wt 82.1 kg   SpO2 97%   BMI 27.51 kg/m    PROVIDERS: Copland, Gay Filler, MD is PCP. Last visit 04/11/19. She is aware of surgery plans. - Sarina Ill, MD is neurologist. Last visit 02/22/19. - She is not routinely followed by a cardiologist, but was seen by Tollie Eth, MD in 2010 for palpitations, incomplete RBBB. Due to risk factors, she had a nuclear stress test that was normal.   LABS: 04/18/19 and 04/28/19 labs reviewed. CBC showed H/H 12.7/39.5, PLT 223. CMET WNL except glucose 144. A1c 7.4%. (all labs ordered are listed, but only abnormal results are displayed)  Labs Reviewed  GLUCOSE, CAPILLARY - Abnormal; Notable for the  following components:      Result Value   Glucose-Capillary 125 (*)    All other components within normal limits  URINALYSIS, ROUTINE W REFLEX MICROSCOPIC - Abnormal; Notable for the following components:   APPearance CLOUDY (*)    Glucose, UA >=500 (*)    All other components within normal limits  URINE CULTURE  SURGICAL PCR SCREEN  TYPE AND SCREEN   Treated for E. Coli UTI 04/24/19. No growth on follow-up urine culture 04/28/19.  Normal spirometry 08/10/15.    EKG: 04/28/19: Normal sinus rhythm Incomplete right bundle branch block Nonspecific ST and T wave abnormality Abnormal ECG decreased rate compared to 2017 Confirmed by [place] [phone]) on 04/28/2019 4:16:02 PM   CV: Nuclear stress test 01/26/19 (scanned under Media tab, Outside Record, 10/31/09):  Impression: Normal stress nuclear study. EF 66%.   Past Medical History:  Diagnosis Date  . Allergy   . Cancer [facility])    thyroid cancer  . Cataract   . Chronic migraine BOTOX INJECTION EVERY 3 MONTHS  . Chronic pain in right shoulder   . Constipation   . Diabetes mellitus   . Disorder of inner ear CAUSES VERTIGO OCCASIONALLY  . Fibromyalgia   . GERD (gastroesophageal reflux disease)   . Hemorrhoid   . History of bladder infections   . History of thyroid cancer 2009  S/P TOTAL THYROIDECTOMY AND RADIATION  . Hyperlipidemia   . Hypertension CARDIOLOGIST- DR Wynonia Lawman- WILL REQUEST LATE NOTE   DENIES S & S  . Hypothyroidism   .  Insomnia   . Left shoulder pain   . Macular degeneration   . MRSA infection    nasal treated more than 15 years ago  . Normal nuclear stress test 01-25-2009  . OA (osteoarthritis) JOINTS  . Osteopenia   . Painful orthopaedic hardware ([place])    left foot  . PONV (postoperative nausea and vomiting) SEVERE  . Rotator cuff disorder LEFT SHOULDER RTC IMPINGMENT  . Sleep apnea    mouth guard, no cpap   . Sleep apnea in adult 06/26/2017  . Spondylolisthesis of lumbar region   . TMJ syndrome  WEARS APPLIANCE AT NIGHT  . Wears glasses     Past Surgical History:  Procedure Laterality Date  . BACK SURGERY     Dr Arnoldo Morale- spondylosis  . BILATERAL CARPAL TUNNEL RELEASE  1994  . BILATERAL ELBOW SURG.  1999  . BILATERAL SALPINGOOPHECTOMY  1993   POST-OP URETER REPAIR 12 DAYS AFTER   . CATARACT EXTRACTION, BILATERAL    . COLONOSCOPY    . EYE SURGERY    . FOOT ARTHRODESIS Left 07/10/2016   Procedure: LEFT 2ND TARSAL METATARSAL ARTHRODESIS  GASTROC RECESSION LEFT LAPIDUS MODIFIED MCBRIDE BUIONECTOMY;  Surgeon: Wylene Simmer, MD;  Location: [place];  Service: Orthopedics;  Laterality: Left;  Marland Kitchen GASTROC RECESSION EXTREMITY Left 07/10/2016   Procedure: GASTROC RECESSION EXTREMITY;  Surgeon: Wylene Simmer, MD;  Location: [place];  Service: Orthopedics;  Laterality: Left;  . HARDWARE REMOVAL Left 05/20/2018   Procedure: Left foot removal of deep implants;  Surgeon: Wylene Simmer, MD;  Location: [place];  Service: Orthopedics;  Laterality: Left;  35mn  . LEFT THUMB JOINT REPLACEMENT  2005   RIGHT DONE IN 2004  . OOPHORECTOMY    . POLYPECTOMY    . RIGHT KNEE ARTHROSCOPY  X2  BEFORE 2011  . RIGHT KNEE ARTHROSCOPY/ PARTIAL LATERAL MENISECTOMY/ TRICOMPARTMENT CHONDROPLASTY/ DECOMPRESSION CYST  10-26-2009  . RIGHT KNEE CLOSED MANIPULATION  09-11-2010  . RIGHT SHOULDER ARTHROSCOPY  2010  &  2004  . TIBIA CYST REMOVED AND ORIF LEG FX  1958  . TONSILLECTOMY  1968  . TOTAL KNEE ARTHROPLASTY  07-16-2010   RIGHT  . TOTAL THYROIDECTOMY  2009   CANCER  (POST-OP BLEED)  AND RADIATION TX  . trigger fingers Right 2013   3rd and 4th fingers  . UPPER GASTROINTESTINAL ENDOSCOPY    . VAGINAL HYSTERECTOMY  1990    MEDICATIONS: . loratadine (CLARITIN) 10 MG tablet  . acetaminophen (TYLENOL) 500 MG tablet  . blood glucose meter kit and supplies KIT  . Blood Glucose Monitoring Suppl (FREESTYLE LITE) DEVI  . cephALEXin (KEFLEX) 500 MG capsule  .  cetirizine (ZYRTEC) 10 MG tablet  . dapagliflozin propanediol (FARXIGA) 10 MG TABS tablet  . diclofenac (FLECTOR) 1.3 % PTCH  . esomeprazole (NEXIUM) 40 MG capsule  . glucose blood (FREESTYLE LITE) test strip  . JANUVIA 100 MG tablet  . Lancets (FREESTYLE) lancets  . levothyroxine (SYNTHROID) 125 MCG tablet  . losartan (COZAAR) 50 MG tablet  . metoprolol succinate (TOPROL-XL) 50 MG 24 hr tablet  . Multiple Vitamins-Minerals (ICAPS AREDS 2) CAPS  . naproxen sodium (ALEVE) 220 MG tablet  . NASONEX 50 MCG/ACT nasal spray  . nitrofurantoin (MACRODANTIN) 50 MG capsule  . ondansetron (ZOFRAN) 4 MG tablet  . oxyCODONE HCl 7.5 MG TABA  . topiramate ER (QUDEXY XR) 100 MG CS24 sprinkle capsule  . traZODone (DESYREL) 100 MG tablet  . Ubrogepant (UBRELVY) 50  MG TABS  . zolpidem (AMBIEN) 5 MG tablet   No current facility-administered medications for this encounter.     Myra Gianotti, PA-C Surgical Short Stay/Anesthesiology [facility] Phone [phone] [facility] Phone [phone] 04/29/2019 11:06 AM

## 2019-04-29 NOTE — Telephone Encounter (Signed)
Reviewed denial info and sent message to pt with options

## 2019-04-29 NOTE — Anesthesia Preprocedure Evaluation (Addendum)
Anesthesia Evaluation  Patient identified by MRN, date of birth, ID band Patient awake    Reviewed: Allergy & Precautions, NPO status , Patient's Chart, lab work & pertinent test results  History of Anesthesia Complications (+) PONV  Airway Mallampati: II  TM Distance: >3 FB Neck ROM: Full    Dental   Pulmonary sleep apnea ,    Pulmonary exam normal        Cardiovascular hypertension, Pt. on medications Normal cardiovascular exam     Neuro/Psych    GI/Hepatic GERD  Medicated and Controlled,  Endo/Other  diabetes, Type 2, Oral Hypoglycemic Agents  Renal/GU      Musculoskeletal   Abdominal   Peds  Hematology   Anesthesia Other Findings   Reproductive/Obstetrics                            Anesthesia Physical Anesthesia Plan  ASA: III  Anesthesia Plan: General   Post-op Pain Management:    Induction:   PONV Risk Score and Plan: 3 and Ondansetron, Dexamethasone and Midazolam  Airway Management Planned: Oral ETT  Additional Equipment:   Intra-op Plan:   Post-operative Plan: Extubation in OR  Informed Consent: I have reviewed the patients History and Physical, chart, labs and discussed the procedure including the risks, benefits and alternatives for the proposed anesthesia with the patient or authorized representative who has indicated his/her understanding and acceptance.       Plan Discussed with: CRNA and Surgeon  Anesthesia Plan Comments: (PAT note written 04/29/2019 by Myra Gianotti, PA-C. )       Anesthesia Quick Evaluation

## 2019-05-02 ENCOUNTER — Encounter (HOSPITAL_COMMUNITY)
Admission: AD | Disposition: A | Discharge status: Home / Self Care | Payer: Self-pay | Source: Home / Self Care | Attending: Neurosurgery

## 2019-05-02 ENCOUNTER — Inpatient Hospital Stay (HOSPITAL_COMMUNITY): Payer: 59 | Admitting: Vascular Surgery

## 2019-05-02 ENCOUNTER — Inpatient Hospital Stay (HOSPITAL_COMMUNITY)
Admission: AD | Admit: 2019-05-02 | Discharge: 2019-05-03 | DRG: 455 | Disposition: A | Discharge status: Home / Self Care | Payer: 59 | Attending: Neurosurgery | Admitting: Neurosurgery

## 2019-05-02 ENCOUNTER — Inpatient Hospital Stay (HOSPITAL_COMMUNITY): Payer: 59

## 2019-05-02 ENCOUNTER — Inpatient Hospital Stay (HOSPITAL_COMMUNITY): Payer: 59 | Admitting: Certified Registered"

## 2019-05-02 ENCOUNTER — Other Ambulatory Visit: Payer: Self-pay

## 2019-05-02 DIAGNOSIS — I10 Essential (primary) hypertension: Secondary | ICD-10-CM | POA: Diagnosis present

## 2019-05-02 DIAGNOSIS — M4316 Spondylolisthesis, lumbar region: Secondary | ICD-10-CM | POA: Diagnosis present

## 2019-05-02 DIAGNOSIS — M5116 Intervertebral disc disorders with radiculopathy, lumbar region: Secondary | ICD-10-CM | POA: Diagnosis not present

## 2019-05-02 DIAGNOSIS — Z96651 Presence of right artificial knee joint: Secondary | ICD-10-CM | POA: Diagnosis present

## 2019-05-02 DIAGNOSIS — M4726 Other spondylosis with radiculopathy, lumbar region: Secondary | ICD-10-CM | POA: Diagnosis present

## 2019-05-02 DIAGNOSIS — M48062 Spinal stenosis, lumbar region with neurogenic claudication: Principal | ICD-10-CM | POA: Diagnosis present

## 2019-05-02 DIAGNOSIS — K219 Gastro-esophageal reflux disease without esophagitis: Secondary | ICD-10-CM | POA: Diagnosis present

## 2019-05-02 DIAGNOSIS — Z820 Family history of epilepsy and other diseases of the nervous system: Secondary | ICD-10-CM | POA: Diagnosis not present

## 2019-05-02 DIAGNOSIS — Z981 Arthrodesis status: Secondary | ICD-10-CM | POA: Diagnosis not present

## 2019-05-02 DIAGNOSIS — E119 Type 2 diabetes mellitus without complications: Secondary | ICD-10-CM | POA: Diagnosis present

## 2019-05-02 DIAGNOSIS — Z7984 Long term (current) use of oral hypoglycemic drugs: Secondary | ICD-10-CM | POA: Diagnosis not present

## 2019-05-02 DIAGNOSIS — G4733 Obstructive sleep apnea (adult) (pediatric): Secondary | ICD-10-CM | POA: Diagnosis present

## 2019-05-02 DIAGNOSIS — Z8249 Family history of ischemic heart disease and other diseases of the circulatory system: Secondary | ICD-10-CM

## 2019-05-02 DIAGNOSIS — E039 Hypothyroidism, unspecified: Secondary | ICD-10-CM | POA: Diagnosis present

## 2019-05-02 DIAGNOSIS — Z419 Encounter for procedure for purposes other than remedying health state, unspecified: Secondary | ICD-10-CM

## 2019-05-02 DIAGNOSIS — M4326 Fusion of spine, lumbar region: Secondary | ICD-10-CM | POA: Diagnosis not present

## 2019-05-02 LAB — GLUCOSE, CAPILLARY
Glucose-Capillary: 146 mg/dL — ABNORMAL HIGH (ref 70–99)
Glucose-Capillary: 165 mg/dL — ABNORMAL HIGH (ref 70–99)
Glucose-Capillary: 218 mg/dL — ABNORMAL HIGH (ref 70–99)

## 2019-05-02 SURGERY — POSTERIOR LUMBAR FUSION 1 LEVEL
Anesthesia: General | Site: Spine Lumbar

## 2019-05-02 MED ORDER — BUPIVACAINE LIPOSOME 1.3 % IJ SUSP
20.0000 mL | INTRAMUSCULAR | Status: AC
  Administered 2019-05-02: 20 mL
  Filled 2019-05-02: qty 20

## 2019-05-02 MED ORDER — LORATADINE 10 MG PO TABS
10.0000 mg | ORAL_TABLET | Freq: Every day | ORAL | Status: DC
  Administered 2019-05-02: 10 mg via ORAL
  Filled 2019-05-02: qty 1

## 2019-05-02 MED ORDER — SCOPOLAMINE 1 MG/3DAYS TD PT72
MEDICATED_PATCH | TRANSDERMAL | Status: DC | PRN
  Administered 2019-05-02: 1 via TRANSDERMAL

## 2019-05-02 MED ORDER — SODIUM CHLORIDE 0.9% FLUSH: 3.0000 mL | INTRAVENOUS | Status: DC | PRN

## 2019-05-02 MED ORDER — PHENOL 1.4 % MT LIQD: 1.0000 | OROMUCOSAL | Status: DC | PRN

## 2019-05-02 MED ORDER — LOSARTAN POTASSIUM 50 MG PO TABS: 50.0000 mg | ORAL_TABLET | Freq: Every day | ORAL | Status: DC

## 2019-05-02 MED ORDER — SODIUM CHLORIDE 0.9 % IV SOLN
INTRAVENOUS | Status: DC | PRN
  Administered 2019-05-02: 13:00:00 500 mL

## 2019-05-02 MED ORDER — 0.9 % SODIUM CHLORIDE (POUR BTL) OPTIME
TOPICAL | Status: DC | PRN
  Administered 2019-05-02: 1000 mL

## 2019-05-02 MED ORDER — DEXAMETHASONE SODIUM PHOSPHATE 10 MG/ML IJ SOLN
INTRAMUSCULAR | Status: DC | PRN
  Administered 2019-05-02: 10 mg via INTRAVENOUS

## 2019-05-02 MED ORDER — MORPHINE SULFATE (PF) 4 MG/ML IV SOLN
4.0000 mg | INTRAVENOUS | Status: DC | PRN
  Administered 2019-05-02 – 2019-05-03 (×2): 4 mg via INTRAVENOUS
  Filled 2019-05-02 (×2): qty 1

## 2019-05-02 MED ORDER — LIDOCAINE 2% (20 MG/ML) 5 ML SYRINGE
INTRAMUSCULAR | Status: DC | PRN
  Administered 2019-05-02: 80 mg via INTRAVENOUS

## 2019-05-02 MED ORDER — ONDANSETRON HCL 4 MG/2ML IJ SOLN
INTRAMUSCULAR | Status: AC
  Filled 2019-05-02: qty 2

## 2019-05-02 MED ORDER — ACETAMINOPHEN 500 MG PO TABS
1000.0000 mg | ORAL_TABLET | Freq: Four times a day (QID) | ORAL | Status: DC
  Administered 2019-05-02 – 2019-05-03 (×3): 1000 mg via ORAL
  Filled 2019-05-02 (×3): qty 2

## 2019-05-02 MED ORDER — DIPHENHYDRAMINE HCL 50 MG/ML IJ SOLN
12.5000 mg | Freq: Once | INTRAMUSCULAR | Status: AC
  Administered 2019-05-02: 11:00:00 12.5 mg via INTRAVENOUS

## 2019-05-02 MED ORDER — ROCURONIUM BROMIDE 10 MG/ML (PF) SYRINGE
PREFILLED_SYRINGE | INTRAVENOUS | Status: AC
  Filled 2019-05-02: qty 30

## 2019-05-02 MED ORDER — OXYCODONE HCL 5 MG PO TABS
5.0000 mg | ORAL_TABLET | ORAL | Status: DC | PRN
  Administered 2019-05-02: 5 mg via ORAL

## 2019-05-02 MED ORDER — DIPHENHYDRAMINE HCL 25 MG PO CAPS
25.0000 mg | ORAL_CAPSULE | Freq: Four times a day (QID) | ORAL | Status: DC | PRN
  Administered 2019-05-02: 25 mg via ORAL
  Filled 2019-05-02: qty 1

## 2019-05-02 MED ORDER — CHLORHEXIDINE GLUCONATE CLOTH 2 % EX PADS: 6.0000 | MEDICATED_PAD | Freq: Once | CUTANEOUS | Status: DC

## 2019-05-02 MED ORDER — LIDOCAINE 2% (20 MG/ML) 5 ML SYRINGE
INTRAMUSCULAR | Status: AC
  Filled 2019-05-02: qty 10

## 2019-05-02 MED ORDER — VANCOMYCIN HCL IN DEXTROSE 1-5 GM/200ML-% IV SOLN
1000.0000 mg | Freq: Once | INTRAVENOUS | Status: AC
  Administered 2019-05-02: 1000 mg via INTRAVENOUS
  Filled 2019-05-02: qty 200

## 2019-05-02 MED ORDER — DIPHENHYDRAMINE HCL 50 MG/ML IJ SOLN
INTRAMUSCULAR | Status: AC
  Filled 2019-05-02: qty 1

## 2019-05-02 MED ORDER — OXYCODONE HCL 5 MG PO TABS
10.0000 mg | ORAL_TABLET | ORAL | Status: DC | PRN
  Administered 2019-05-02 – 2019-05-03 (×2): 10 mg via ORAL
  Filled 2019-05-02 (×2): qty 2

## 2019-05-02 MED ORDER — ONDANSETRON HCL 4 MG/2ML IJ SOLN
INTRAMUSCULAR | Status: DC | PRN
  Administered 2019-05-02: 4 mg via INTRAVENOUS

## 2019-05-02 MED ORDER — FENTANYL CITRATE (PF) 250 MCG/5ML IJ SOLN
INTRAMUSCULAR | Status: AC
  Filled 2019-05-02: qty 5

## 2019-05-02 MED ORDER — PROPOFOL 10 MG/ML IV BOLUS
INTRAVENOUS | Status: DC | PRN
  Administered 2019-05-02: 150 mg via INTRAVENOUS

## 2019-05-02 MED ORDER — MIDAZOLAM HCL 5 MG/5ML IJ SOLN
INTRAMUSCULAR | Status: DC | PRN
  Administered 2019-05-02: 2 mg via INTRAVENOUS

## 2019-05-02 MED ORDER — LACTATED RINGERS IV SOLN
INTRAVENOUS | Status: DC
  Administered 2019-05-02: 10:00:00 via INTRAVENOUS

## 2019-05-02 MED ORDER — THROMBIN 5000 UNITS EX SOLR
CUTANEOUS | Status: AC
  Filled 2019-05-02: qty 5000

## 2019-05-02 MED ORDER — SCOPOLAMINE 1 MG/3DAYS TD PT72
MEDICATED_PATCH | TRANSDERMAL | Status: AC
  Filled 2019-05-02: qty 1

## 2019-05-02 MED ORDER — MEPERIDINE HCL 25 MG/ML IJ SOLN: 6.2500 mg | INTRAMUSCULAR | Status: DC | PRN

## 2019-05-02 MED ORDER — ONDANSETRON HCL 4 MG PO TABS: 8.0000 mg | ORAL_TABLET | Freq: Three times a day (TID) | ORAL | Status: DC | PRN

## 2019-05-02 MED ORDER — BUPIVACAINE-EPINEPHRINE (PF) 0.5% -1:200000 IJ SOLN
INTRAMUSCULAR | Status: DC | PRN
  Administered 2019-05-02: 10 mL

## 2019-05-02 MED ORDER — DEXAMETHASONE SODIUM PHOSPHATE 10 MG/ML IJ SOLN
INTRAMUSCULAR | Status: AC
  Filled 2019-05-02: qty 1

## 2019-05-02 MED ORDER — ROCURONIUM BROMIDE 10 MG/ML (PF) SYRINGE
PREFILLED_SYRINGE | INTRAVENOUS | Status: DC | PRN
  Administered 2019-05-02: 30 mg via INTRAVENOUS
  Administered 2019-05-02: 10 mg via INTRAVENOUS
  Administered 2019-05-02: 20 mg via INTRAVENOUS
  Administered 2019-05-02: 80 mg via INTRAVENOUS

## 2019-05-02 MED ORDER — MENTHOL 3 MG MT LOZG: 1.0000 | LOZENGE | OROMUCOSAL | Status: DC | PRN

## 2019-05-02 MED ORDER — METOPROLOL SUCCINATE ER 50 MG PO TB24: 50.0000 mg | ORAL_TABLET | Freq: Every day | ORAL | Status: DC

## 2019-05-02 MED ORDER — FENTANYL CITRATE (PF) 100 MCG/2ML IJ SOLN
INTRAMUSCULAR | Status: DC | PRN
  Administered 2019-05-02: 50 ug via INTRAVENOUS
  Administered 2019-05-02: 25 ug via INTRAVENOUS
  Administered 2019-05-02: 50 ug via INTRAVENOUS
  Administered 2019-05-02: 150 ug via INTRAVENOUS
  Administered 2019-05-02: 50 ug via INTRAVENOUS

## 2019-05-02 MED ORDER — ACETAMINOPHEN 325 MG PO TABS: 650.0000 mg | ORAL_TABLET | ORAL | Status: DC | PRN

## 2019-05-02 MED ORDER — BACITRACIN ZINC 500 UNIT/GM EX OINT
TOPICAL_OINTMENT | CUTANEOUS | Status: DC | PRN
  Administered 2019-05-02: 1 via TOPICAL

## 2019-05-02 MED ORDER — SODIUM CHLORIDE 0.9% FLUSH
3.0000 mL | Freq: Two times a day (BID) | INTRAVENOUS | Status: DC
  Administered 2019-05-02: 3 mL via INTRAVENOUS

## 2019-05-02 MED ORDER — DOCUSATE SODIUM 100 MG PO CAPS
100.0000 mg | ORAL_CAPSULE | Freq: Two times a day (BID) | ORAL | Status: DC
  Administered 2019-05-02: 100 mg via ORAL
  Filled 2019-05-02: qty 1

## 2019-05-02 MED ORDER — CYCLOBENZAPRINE HCL 10 MG PO TABS
10.0000 mg | ORAL_TABLET | Freq: Three times a day (TID) | ORAL | Status: DC | PRN
  Administered 2019-05-02 (×2): 10 mg via ORAL
  Filled 2019-05-02: qty 1

## 2019-05-02 MED ORDER — BACITRACIN ZINC 500 UNIT/GM EX OINT
TOPICAL_OINTMENT | CUTANEOUS | Status: AC
  Filled 2019-05-02: qty 28.35

## 2019-05-02 MED ORDER — GLYCOPYRROLATE PF 0.2 MG/ML IJ SOSY
PREFILLED_SYRINGE | INTRAMUSCULAR | Status: AC
  Filled 2019-05-02: qty 1

## 2019-05-02 MED ORDER — TOPIRAMATE ER 100 MG PO SPRINKLE CAP24
100.0000 mg | EXTENDED_RELEASE_CAPSULE | Freq: Every day | ORAL | Status: DC
  Administered 2019-05-02: 100 mg via ORAL
  Filled 2019-05-02 (×2): qty 1

## 2019-05-02 MED ORDER — THROMBIN 5000 UNITS EX SOLR
OROMUCOSAL | Status: DC | PRN
  Administered 2019-05-02: 13:00:00 5 mL via TOPICAL

## 2019-05-02 MED ORDER — ACETAMINOPHEN 650 MG RE SUPP: 650.0000 mg | RECTAL | Status: DC | PRN

## 2019-05-02 MED ORDER — INSULIN ASPART 100 UNIT/ML ~~LOC~~ SOLN: 0.0000 [IU] | Freq: Three times a day (TID) | SUBCUTANEOUS | Status: DC

## 2019-05-02 MED ORDER — SODIUM CHLORIDE 0.9 % IV SOLN
INTRAVENOUS | Status: DC | PRN
  Administered 2019-05-02: 12:00:00 30 ug/min via INTRAVENOUS

## 2019-05-02 MED ORDER — SODIUM CHLORIDE 0.9 % IV SOLN: 250.0000 mL | INTRAVENOUS | Status: DC

## 2019-05-02 MED ORDER — EPHEDRINE SULFATE-NACL 50-0.9 MG/10ML-% IV SOSY
PREFILLED_SYRINGE | INTRAVENOUS | Status: DC | PRN
  Administered 2019-05-02 (×2): 10 mg via INTRAVENOUS

## 2019-05-02 MED ORDER — DIPHENHYDRAMINE HCL 50 MG/ML IJ SOLN
INTRAMUSCULAR | Status: AC
  Administered 2019-05-02: 12.5 mg via INTRAVENOUS
  Filled 2019-05-02: qty 1

## 2019-05-02 MED ORDER — EPHEDRINE 5 MG/ML INJ
INTRAVENOUS | Status: AC
  Filled 2019-05-02: qty 10

## 2019-05-02 MED ORDER — ONDANSETRON HCL 4 MG PO TABS: 4.0000 mg | ORAL_TABLET | Freq: Four times a day (QID) | ORAL | Status: DC | PRN

## 2019-05-02 MED ORDER — HYDROMORPHONE HCL 1 MG/ML IJ SOLN
0.2500 mg | INTRAMUSCULAR | Status: DC | PRN
  Administered 2019-05-02 (×2): 0.25 mg via INTRAVENOUS

## 2019-05-02 MED ORDER — VANCOMYCIN HCL IN DEXTROSE 1-5 GM/200ML-% IV SOLN
1000.0000 mg | INTRAVENOUS | Status: AC
  Administered 2019-05-02: 1000 mg via INTRAVENOUS
  Filled 2019-05-02: qty 200

## 2019-05-02 MED ORDER — UBROGEPANT 50 MG PO TABS: 50.0000 mg | ORAL_TABLET | ORAL | Status: DC | PRN

## 2019-05-02 MED ORDER — OXYCODONE HCL 5 MG PO TABS
ORAL_TABLET | ORAL | Status: AC
  Filled 2019-05-02: qty 1

## 2019-05-02 MED ORDER — BISACODYL 10 MG RE SUPP: 10.0000 mg | Freq: Every day | RECTAL | Status: DC | PRN

## 2019-05-02 MED ORDER — BUPIVACAINE-EPINEPHRINE (PF) 0.5% -1:200000 IJ SOLN
INTRAMUSCULAR | Status: AC
  Filled 2019-05-02: qty 30

## 2019-05-02 MED ORDER — MIDAZOLAM HCL 2 MG/2ML IJ SOLN
INTRAMUSCULAR | Status: AC
  Filled 2019-05-02: qty 2

## 2019-05-02 MED ORDER — METHOCARBAMOL 500 MG PO TABS
ORAL_TABLET | ORAL | Status: AC
  Filled 2019-05-02: qty 1

## 2019-05-02 MED ORDER — PANTOPRAZOLE SODIUM 40 MG PO TBEC: 80.0000 mg | DELAYED_RELEASE_TABLET | Freq: Every day | ORAL | Status: DC

## 2019-05-02 MED ORDER — LEVOTHYROXINE SODIUM 137 MCG PO TABS
137.0000 ug | ORAL_TABLET | Freq: Every day | ORAL | Status: DC
  Administered 2019-05-03: 137 ug via ORAL
  Filled 2019-05-02 (×2): qty 1

## 2019-05-02 MED ORDER — HYDROMORPHONE HCL 1 MG/ML IJ SOLN
INTRAMUSCULAR | Status: AC
  Administered 2019-05-02: 0.25 mg via INTRAVENOUS
  Filled 2019-05-02: qty 1

## 2019-05-02 MED ORDER — SUGAMMADEX SODIUM 200 MG/2ML IV SOLN
INTRAVENOUS | Status: DC | PRN
  Administered 2019-05-02: 180 mg via INTRAVENOUS

## 2019-05-02 MED ORDER — LINAGLIPTIN 5 MG PO TABS
5.0000 mg | ORAL_TABLET | Freq: Every day | ORAL | Status: DC
  Administered 2019-05-02: 5 mg via ORAL
  Filled 2019-05-02 (×3): qty 1

## 2019-05-02 MED ORDER — FLUTICASONE PROPIONATE 50 MCG/ACT NA SUSP
1.0000 | Freq: Every day | NASAL | Status: DC
  Filled 2019-05-02: qty 16

## 2019-05-02 MED ORDER — TRAZODONE HCL 100 MG PO TABS
100.0000 mg | ORAL_TABLET | Freq: Every day | ORAL | Status: DC
  Filled 2019-05-02: qty 1

## 2019-05-02 MED ORDER — ONDANSETRON HCL 4 MG/2ML IJ SOLN: 4.0000 mg | Freq: Once | INTRAMUSCULAR | Status: DC | PRN

## 2019-05-02 MED ORDER — ONDANSETRON HCL 4 MG/2ML IJ SOLN: 4.0000 mg | Freq: Four times a day (QID) | INTRAMUSCULAR | Status: DC | PRN

## 2019-05-02 MED ORDER — DIPHENHYDRAMINE HCL 50 MG/ML IJ SOLN
INTRAMUSCULAR | Status: DC | PRN
  Administered 2019-05-02: 12.5 mg via INTRAVENOUS

## 2019-05-02 MED ORDER — ZOLPIDEM TARTRATE 5 MG PO TABS: 5.0000 mg | ORAL_TABLET | Freq: Every evening | ORAL | Status: DC | PRN

## 2019-05-02 MED ORDER — CANAGLIFLOZIN 100 MG PO TABS
100.0000 mg | ORAL_TABLET | Freq: Every day | ORAL | Status: DC
  Administered 2019-05-03: 100 mg via ORAL
  Filled 2019-05-02 (×2): qty 1

## 2019-05-02 SURGICAL SUPPLY — 73 items
BAG DECANTER FOR FLEXI CONT (MISCELLANEOUS) ×2 IMPLANT
BENZOIN TINCTURE PRP APPL 2/3 (GAUZE/BANDAGES/DRESSINGS) ×2 IMPLANT
BLADE CLIPPER SURG (BLADE) IMPLANT
BUR MATCHSTICK NEURO 3.0 LAGG (BURR) ×2 IMPLANT
BUR PRECISION FLUTE 6.0 (BURR) ×2 IMPLANT
CAGE ALTERA 10X31X9-13 15D (Cage) ×1 IMPLANT
CANISTER SUCT 3000ML PPV (MISCELLANEOUS) ×2 IMPLANT
CAP REVERE LOCKING (Cap) ×6 IMPLANT
CARTRIDGE OIL MAESTRO DRILL (MISCELLANEOUS) ×1 IMPLANT
CONN CROSSLINK REV 6.35 48-60 (Connector) ×2 IMPLANT
CONNECTOR CRSLNK REV6.35 48-60 (Connector) IMPLANT
CONT SPEC 4OZ CLIKSEAL STRL BL (MISCELLANEOUS) ×2 IMPLANT
COVER BACK TABLE 60X90IN (DRAPES) ×2 IMPLANT
COVER WAND RF STERILE (DRAPES) ×1 IMPLANT
DECANTER SPIKE VIAL GLASS SM (MISCELLANEOUS) ×2 IMPLANT
DIFFUSER DRILL AIR PNEUMATIC (MISCELLANEOUS) ×2 IMPLANT
DRAPE C-ARM 42X72 X-RAY (DRAPES) ×6 IMPLANT
DRAPE HALF SHEET 40X57 (DRAPES) ×3 IMPLANT
DRAPE LAPAROTOMY 100X72X124 (DRAPES) ×2 IMPLANT
DRAPE SURG 17X23 STRL (DRAPES) ×8 IMPLANT
DRSG OPSITE POSTOP 4X8 (GAUZE/BANDAGES/DRESSINGS) ×1 IMPLANT
ELECT BLADE 4.0 EZ CLEAN MEGAD (MISCELLANEOUS) ×2
ELECT REM PT RETURN 9FT ADLT (ELECTROSURGICAL) ×2
ELECTRODE BLDE 4.0 EZ CLN MEGD (MISCELLANEOUS) ×1 IMPLANT
ELECTRODE REM PT RTRN 9FT ADLT (ELECTROSURGICAL) ×1 IMPLANT
EVACUATOR 1/8 PVC DRAIN (DRAIN) IMPLANT
GAUZE 4X4 16PLY RFD (DISPOSABLE) ×1 IMPLANT
GAUZE SPONGE 4X4 12PLY STRL (GAUZE/BANDAGES/DRESSINGS) ×1 IMPLANT
GLOVE BIO SURGEON STRL SZ 6.5 (GLOVE) ×5 IMPLANT
GLOVE BIO SURGEON STRL SZ8 (GLOVE) ×4 IMPLANT
GLOVE BIO SURGEON STRL SZ8.5 (GLOVE) ×4 IMPLANT
GLOVE BIOGEL PI IND STRL 6.5 (GLOVE) IMPLANT
GLOVE BIOGEL PI IND STRL 7.5 (GLOVE) IMPLANT
GLOVE BIOGEL PI INDICATOR 6.5 (GLOVE) ×1
GLOVE BIOGEL PI INDICATOR 7.5 (GLOVE) ×1
GLOVE EXAM NITRILE XL STR (GLOVE) IMPLANT
GLOVE SURG SS PI 7.0 STRL IVOR (GLOVE) ×2 IMPLANT
GOWN STRL REUS W/ TWL LRG LVL3 (GOWN DISPOSABLE) IMPLANT
GOWN STRL REUS W/ TWL XL LVL3 (GOWN DISPOSABLE) ×2 IMPLANT
GOWN STRL REUS W/TWL 2XL LVL3 (GOWN DISPOSABLE) IMPLANT
GOWN STRL REUS W/TWL LRG LVL3 (GOWN DISPOSABLE) ×1
GOWN STRL REUS W/TWL XL LVL3 (GOWN DISPOSABLE) ×3
HEMOSTAT POWDER KIT SURGIFOAM (HEMOSTASIS) ×2 IMPLANT
KIT BASIN OR (CUSTOM PROCEDURE TRAY) ×2 IMPLANT
KIT TURNOVER KIT B (KITS) ×2 IMPLANT
MILL MEDIUM DISP (BLADE) ×1 IMPLANT
NDL HYPO 21X1.5 SAFETY (NEEDLE) IMPLANT
NEEDLE HYPO 21X1.5 SAFETY (NEEDLE) ×2 IMPLANT
NEEDLE HYPO 22GX1.5 SAFETY (NEEDLE) ×2 IMPLANT
NS IRRIG 1000ML POUR BTL (IV SOLUTION) ×2 IMPLANT
OIL CARTRIDGE MAESTRO DRILL (MISCELLANEOUS) ×2
PACK LAMINECTOMY NEURO (CUSTOM PROCEDURE TRAY) ×2 IMPLANT
PAD ARMBOARD 7.5X6 YLW CONV (MISCELLANEOUS) ×6 IMPLANT
PATTIES SURGICAL .5 X1 (DISPOSABLE) IMPLANT
PUTTY DBM 10CC CALC GRAN (Putty) ×1 IMPLANT
RASP 3.0MM (RASP) ×1 IMPLANT
ROD REVERE 7.5MM (Rod) ×1 IMPLANT
ROD REVERE CURVED 65MM (Rod) ×1 IMPLANT
SCREW 7.5X50MM (Screw) ×2 IMPLANT
SCREW REVERE 6.5X50MM (Screw) ×1 IMPLANT
SPONGE LAP 4X18 RFD (DISPOSABLE) IMPLANT
SPONGE NEURO XRAY DETECT 1X3 (DISPOSABLE) ×1 IMPLANT
SPONGE SURGIFOAM ABS GEL 100 (HEMOSTASIS) IMPLANT
STRIP CLOSURE SKIN 1/2X4 (GAUZE/BANDAGES/DRESSINGS) ×2 IMPLANT
SUT VIC AB 1 CT1 18XBRD ANBCTR (SUTURE) ×2 IMPLANT
SUT VIC AB 1 CT1 8-18 (SUTURE) ×2
SUT VIC AB 2-0 CP2 18 (SUTURE) ×4 IMPLANT
SYR 20ML LL LF (SYRINGE) ×1 IMPLANT
TOWEL GREEN STERILE (TOWEL DISPOSABLE) ×2 IMPLANT
TOWEL GREEN STERILE FF (TOWEL DISPOSABLE) ×2 IMPLANT
TRAY FOLEY MTR SLVR 16FR STAT (SET/KITS/TRAYS/PACK) ×2 IMPLANT
TUBE CONNECTING 12X1/4 (SUCTIONS) ×1 IMPLANT
WATER STERILE IRR 1000ML POUR (IV SOLUTION) ×2 IMPLANT

## 2019-05-02 NOTE — Transfer of Care (Signed)
Immediate Anesthesia Transfer of Care Note  Patient: Debbie Hayes  Procedure(s) Performed: POSTERIOR LUMBAR INTERBODY FUSION LUMBAR THREE LUMBAR FOUR; REMOVAL OF HARDWARE FUSION FOUR-FIVE (N/A Spine Lumbar)  Patient Location: PACU  Anesthesia Type:General  Level of Consciousness: awake, alert , oriented and patient cooperative  Airway & Oxygen Therapy: Patient Spontanous Breathing and Patient connected to face mask oxygen  Post-op Assessment: Report given to RN and Post -op Vital signs reviewed and stable  Post vital signs: Reviewed and stable  Last Vitals:  Vitals Value Taken Time  BP 133/69 05/02/19 1613  Temp    Pulse 75 05/02/19 1618  Resp 14 05/02/19 1618  SpO2 97 % 05/02/19 1618  Vitals shown include unvalidated device data.  Last Pain:  Vitals:   05/02/19 1003  TempSrc: Oral  PainSc: 4       Patients Stated Pain Goal: 3 ([id number])  Complications: No apparent anesthesia complications

## 2019-05-02 NOTE — H&P (Signed)
Subjective: The patient is a 68 year old white female on whom I previously formed an L4-5 decompression, instrumentation and fusion.  She did well for years but has developed recurrent back and leg pain.  She has failed medical management.  She was worked up with lumbar x-rays and lumbar MRI which demonstrated L3-4 disc degeneration, facet arthropathy and spondylolisthesis.  I discussed the various treatment options with her.  She has decided to proceed with surgery.  Past Medical History:  Diagnosis Date  . Allergy   . Cancer [facility])    thyroid cancer  . Cataract   . Chronic migraine BOTOX INJECTION EVERY 3 MONTHS  . Chronic pain in right shoulder   . Constipation   . Diabetes mellitus   . Disorder of inner ear CAUSES VERTIGO OCCASIONALLY  . Fibromyalgia   . GERD (gastroesophageal reflux disease)   . Hemorrhoid   . History of bladder infections   . History of thyroid cancer 2009  S/P TOTAL THYROIDECTOMY AND RADIATION  . Hyperlipidemia   . Hypertension CARDIOLOGIST- DR Wynonia Lawman- WILL REQUEST LATE NOTE   DENIES S & S  . Hypothyroidism   . Insomnia   . Left shoulder pain   . Macular degeneration   . MRSA infection    nasal treated more than 15 years ago  . Normal nuclear stress test 01-25-2009  . OA (osteoarthritis) JOINTS  . Osteopenia   . Painful orthopaedic hardware ([place])    left foot  . PONV (postoperative nausea and vomiting) SEVERE  . Rotator cuff disorder LEFT SHOULDER RTC IMPINGMENT  . Sleep apnea    mouth guard, no cpap   . Sleep apnea in adult 06/26/2017  . Spondylolisthesis of lumbar region   . 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 Wears glasses     Past Surgical History:  Procedure Laterality Date  . BACK SURGERY     Dr Arnoldo Morale- spondylosis  . BILATERAL CARPAL TUNNEL RELEASE  1994  . BILATERAL ELBOW SURG.  1999  . BILATERAL SALPINGOOPHECTOMY  1993   POST-OP URETER REPAIR 12 DAYS AFTER   . CATARACT EXTRACTION, BILATERAL    . COLONOSCOPY    . EYE SURGERY     . FOOT ARTHRODESIS Left 07/10/2016   Procedure: LEFT 2ND TARSAL METATARSAL ARTHRODESIS  GASTROC RECESSION LEFT LAPIDUS MODIFIED MCBRIDE BUIONECTOMY;  Surgeon: Wylene Simmer, MD;  Location: [place];  Service: Orthopedics;  Laterality: Left;  Marland Kitchen GASTROC RECESSION EXTREMITY Left 07/10/2016   Procedure: GASTROC RECESSION EXTREMITY;  Surgeon: Wylene Simmer, MD;  Location: [place];  Service: Orthopedics;  Laterality: Left;  . HARDWARE REMOVAL Left 05/20/2018   Procedure: Left foot removal of deep implants;  Surgeon: Wylene Simmer, MD;  Location: [place];  Service: Orthopedics;  Laterality: Left;  51mn  . LEFT THUMB JOINT REPLACEMENT  2005   RIGHT DONE IN 2004  . OOPHORECTOMY    . POLYPECTOMY    . RIGHT KNEE ARTHROSCOPY  X2  BEFORE 2011  . RIGHT KNEE ARTHROSCOPY/ PARTIAL LATERAL MENISECTOMY/ TRICOMPARTMENT CHONDROPLASTY/ DECOMPRESSION CYST  10-26-2009  . RIGHT KNEE CLOSED MANIPULATION  09-11-2010  . RIGHT SHOULDER ARTHROSCOPY  2010  &  2004  . TIBIA CYST REMOVED AND ORIF LEG FX  1958  . TONSILLECTOMY  1968  . TOTAL KNEE ARTHROPLASTY  07-16-2010   RIGHT  . TOTAL THYROIDECTOMY  2009   CANCER  (POST-OP BLEED)  AND RADIATION TX  . trigger fingers Right 2013   3rd and 4th fingers  .  UPPER GASTROINTESTINAL ENDOSCOPY    . VAGINAL HYSTERECTOMY  1990    Allergies  Allergen Reactions  . Statins Other (See Comments)    Pt has tried multiple and cannot tolerate  . Sulfa Antibiotics Hives    Social History   Tobacco Use  . Smoking status: Never Smoker  . Smokeless tobacco: Never Used  Substance Use Topics  . Alcohol use: Yes    Comment: RARE    Family History  Problem Relation Age of Onset  . Colon polyps Sister 8  . Alzheimer's disease Mother   . Heart disease Father   . Cirrhosis Father   . Heart attack Father   . Macular degeneration Father   . Colon cancer Neg Hx    Prior to Admission medications   Medication Sig Start Date End  Date Taking? Authorizing Provider  acetaminophen (TYLENOL) 500 MG tablet Take 1,000 mg by mouth every 6 (six) hours as needed for moderate pain or headache.    Yes [provider]  dapagliflozin propanediol (FARXIGA) 10 MG TABS tablet Take 10 mg by mouth daily. 11/29/18  Yes Copland, Gay Filler, MD  esomeprazole (NEXIUM) 40 MG capsule Take 40 mg by mouth daily as needed (acid reflux).    Yes [provider]  JANUVIA 100 MG tablet TAKE 1 TABLET BY MOUTH DAILY. Patient taking differently: Take 100 mg by mouth daily.  01/11/19  Yes Copland, Gay Filler, MD  levothyroxine (SYNTHROID) 125 MCG tablet TAKE 1 TABLET (125 MCG TOTAL) BY MOUTH DAILY BEFORE BREAKFAST. 01/31/19  Yes Copland, Gay Filler, MD  loratadine (CLARITIN) 10 MG tablet Take 10 mg by mouth at bedtime.   Yes [provider]  losartan (COZAAR) 50 MG tablet Take 1 tablet (50 mg total) by mouth daily. 04/22/19  Yes Copland, Gay Filler, MD  metoprolol succinate (TOPROL-XL) 50 MG 24 hr tablet Take 50 mg by mouth daily. Take with or immediately following a meal.   Yes [provider]  Multiple Vitamins-Minerals (ICAPS AREDS 2) CAPS Take 1 capsule by mouth 2 (two) times daily.    Yes [provider]  naproxen sodium (ALEVE) 220 MG tablet Take 440 mg by mouth 2 (two) times daily as needed (pain).    Yes [provider]  NASONEX 50 MCG/ACT nasal spray PLACE 2 SPRAYS INTO THE NOSE DAILY. Patient taking differently: Place 2 sprays into the nose daily.  09/20/14  Yes [name], MD  nitrofurantoin (MACRODANTIN) 50 MG capsule Take 1 capsule (50 mg total) by mouth at bedtime. 10/06/18  Yes Copland, Gay Filler, MD  oxyCODONE HCl 7.5 MG TABA Take 1 every 8 hours as needed for severe pain Patient taking differently: Take 7.5 mg by mouth every 8 (eight) hours as needed (severe pain). Take 1 every 8 hours as needed for severe pain 02/23/19  Yes Copland, Gay Filler, MD  Pseudoephedrine-DM-GG (ROBITUSSIN CF PO) Take 2  capsules by mouth daily.   Yes [provider]  topiramate ER (QUDEXY XR) 100 MG CS24 sprinkle capsule Take 1 capsule (100 mg total) by mouth at bedtime. 02/22/19  Yes Melvenia Beam, MD  traZODone (DESYREL) 100 MG tablet TAKE 2.5 TABLETS BY MOUTH AT BEDTIME. Patient taking differently: Take 250 mg by mouth at bedtime.  02/14/19  Yes Copland, Gay Filler, MD  Ubrogepant (UBRELVY) 50 MG TABS Take 50 mg by mouth every 2 (two) hours as needed. Max '200mg'$  a day Patient taking differently: Take 50 mg by mouth every 2 (two) hours  as needed (migraines). Max '200mg'$  a day 11/10/18  Yes Melvenia Beam, MD  zolpidem (AMBIEN) 5 MG tablet TAKE 1/2 TABLET BY MOUTH AT BEDTIME AS NEEDED FOR SLEEP Patient taking differently: Take 2.5 mg by mouth at bedtime as needed for sleep. TAKE 1/2 TABLET BY MOUTH AT BEDTIME AS NEEDED FOR SLEEP 11/19/18  Yes Copland, Gay Filler, MD  blood glucose meter kit and supplies KIT Test blood sugar once daily. Dx code: E11.9 03/08/15   [name], MD  Blood Glucose Monitoring Suppl (FREESTYLE LITE) DEVI Check blood sugar no more than twice daily 09/23/17   Copland, Gay Filler, MD  diclofenac (FLECTOR) 1.3 % PTCH PLACE 1 PATCH ONTO THE SKIN 2 TIMES DAILY. 04/13/18   Copland, Gay Filler, MD  glucose blood (FREESTYLE LITE) test strip CHECK BLOOD SUGARS NO MORE THAN TWICE DAILY 10/26/18   Copland, Gay Filler, MD  Lancets (FREESTYLE) lancets Check blood sugars no more than twice daily 09/23/17   Copland, Gay Filler, MD  ondansetron (ZOFRAN) 4 MG tablet TAKE 1 TABLET BY MOUTH EVERY 8 HOURS AS NEEDED FOR NAUSEA AND VOMITING 04/13/18   Copland, Gay Filler, MD     Review of Systems  Positive ROS: As above  All other systems have been reviewed and were otherwise negative with the exception of those mentioned in the HPI and as above.  Objective: Vital signs in last 24 hours: Temp:  [98.2 F (36.8 C)] 98.2 F (36.8 C) (10/05 1003) Pulse Rate:  [68] 68 (10/05 1003) Resp:  [18] 18 (10/05  1003) BP: (120)/(79) 120/79 (10/05 1003) SpO2:  [97 %] 97 % (10/05 1003) Weight:  [82.1 kg] 82.1 kg (10/05 1003) Estimated body mass index is 27.51 kg/m as calculated from the following:   Height as of this encounter: '5\' 8"'$  (1.727 m).   Weight as of this encounter: 82.1 kg.   General Appearance: Alert Head: Normocephalic, without obvious abnormality, atraumatic Eyes: PERRL, conjunctiva/corneas clear, EOM's intact,    Ears: Normal  Throat: Normal  Neck: Supple, Back: Her lumbar incision is well-healed. Lungs: Clear to auscultation bilaterally, respirations unlabored Heart: Regular rate and rhythm, no murmur, rub or gallop Abdomen: Soft, non-tender Extremities: Extremities normal, atraumatic, no cyanosis or edema Skin: unremarkable  NEUROLOGIC:   Mental status: alert and oriented,Motor Exam - grossly normal Sensory Exam - grossly normal Reflexes:  Coordination - grossly normal Gait - grossly normal Balance - grossly normal Cranial Nerves: I: smell Not tested  II: visual acuity  OS: Normal  OD: Normal   II: visual fields Full to confrontation  II: pupils Equal, round, reactive to light  III,VII: ptosis None  III,IV,VI: extraocular muscles  Full ROM  V: mastication Normal  V: facial light touch sensation  Normal  V,VII: corneal reflex  Present  VII: facial muscle function - upper  Normal  VII: facial muscle function - lower Normal  VIII: hearing Not tested  IX: soft palate elevation  Normal  IX,X: gag reflex Present  XI: trapezius strength  5/5  XI: sternocleidomastoid strength 5/5  XI: neck flexion strength  5/5  XII: tongue strength  Normal    Data Review Lab Results  Component Value Date   WBC 6.5 04/18/2019   HGB 12.7 04/18/2019   HCT 39.5 04/18/2019   MCV 81.1 04/18/2019   PLT 223.0 04/18/2019   Lab Results  Component Value Date   NA 139 04/18/2019   K 4.0 04/18/2019   CL 105 04/18/2019   CO2 28 04/18/2019  BUN 18 04/18/2019   CREATININE 0.74  04/18/2019   GLUCOSE 144 (H) 04/18/2019   Lab Results  Component Value Date   INR 0.96 10/23/2015    Assessment/Plan: L3-4 adjacent segment disease with spondylolisthesis, degenerative disc disease, facet arthropathy, spondylolisthesis, lumbago, lumbar radiculopathy, neurogenic claudication: I have discussed the situation with the patient and her husband.  I reviewed her imaging studies with them and pointed out the abnormalities.  We have discussed the various treatment options including surgery.  I have described the surgical treatment option of an exploration of her lumbar fusion with an L3-4 decompression, instrumentation and fusion.  I have shown her surgical models.  I have given her a surgical pamphlet.  We have discussed the risks, benefits, alternatives, expected postoperative course, and likelihood of achieving our goals with surgery.  I have answered all the patient's questions.  She has decided to proceed with surgery.   Ophelia Charter 05/02/2019 11:05 AM

## 2019-05-02 NOTE — Progress Notes (Signed)
Subjective: The patient is alert and pleasant.  She is in no apparent distress.  She looks well.  Objective: Vital signs in last 24 hours: Temp:  [97.2 F (36.2 C)-98.2 F (36.8 C)] 97.2 F (36.2 C) (10/05 1645) Pulse Rate:  [66-75] 68 (10/05 1645) Resp:  [10-18] 16 (10/05 1645) BP: (114-133)/(65-79) 114/70 (10/05 1645) SpO2:  [95 %-100 %] 95 % (10/05 1645) Weight:  [82.1 kg] 82.1 kg (10/05 1003) Estimated body mass index is 27.51 kg/m as calculated from the following:   Height as of this encounter: 5\' 8"  (1.727 m).   Weight as of this encounter: 82.1 kg.   Intake/Output from previous day: No intake/output data recorded. Intake/Output this shift: Total I/O In: 2200 [I.V.:2200] Out: 1250 [Urine:1000; Blood:250]  Physical exam the patient is alert and pleasant.  She is moving her lower extremities well.  Lab Results: No results for input(s): WBC, HGB, HCT, PLT in the last 72 hours. BMET No results for input(s): NA, K, CL, CO2, GLUCOSE, BUN, CREATININE, CALCIUM in the last 72 hours.  Studies/Results: Dg Lumbar Spine 2-3 Views  Result Date: 05/02/2019 CLINICAL DATA:  PLIF L3-L5 EXAM: LUMBAR SPINE - 2-3 VIEW; DG C-ARM 1-60 MIN COMPARISON:  04/12/2012 FINDINGS: Prior radiographs demonstrate 5 lumbar vertebra. Diffuse osseous demineralization. BILATERAL pedicle screws are present at L3, L4 and L5. Disc prostheses at the L3-L4 and L4-L5 disc spaces. IMPRESSION: Intraoperative images during L3-L5 posterior fusion. Electronically Signed   By: Lavonia Dana M.D.   On: 05/02/2019 15:05   Dg C-arm 1-60 Min  Result Date: 05/02/2019 CLINICAL DATA:  PLIF L3-L5 EXAM: LUMBAR SPINE - 2-3 VIEW; DG C-ARM 1-60 MIN COMPARISON:  04/12/2012 FINDINGS: Prior radiographs demonstrate 5 lumbar vertebra. Diffuse osseous demineralization. BILATERAL pedicle screws are present at L3, L4 and L5. Disc prostheses at the L3-L4 and L4-L5 disc spaces. IMPRESSION: Intraoperative images during L3-L5 posterior fusion.  Electronically Signed   By: Lavonia Dana M.D.   On: 05/02/2019 15:05    Assessment/Plan: The patient is doing well.  I have spoken with her husband.  LOS: 0 days     Ophelia Charter 05/02/2019, 4:57 PM     Patient ID: [name], female   DOB: 11-11-1950, 68 y.o.   MRN: [id number]

## 2019-05-02 NOTE — Progress Notes (Signed)
Patient c/o itching scalp, vanc stopped. MD Ossey made aware. Verbal orders received, will continue to monitor.

## 2019-05-02 NOTE — Anesthesia Postprocedure Evaluation (Signed)
Anesthesia Post Note  Patient: Debbie Hayes  Procedure(s) Performed: POSTERIOR LUMBAR INTERBODY FUSION LUMBAR THREE LUMBAR FOUR; REMOVAL OF HARDWARE FUSION FOUR-FIVE (N/A Spine Lumbar)     Patient location during evaluation: PACU Anesthesia Type: General Level of consciousness: awake and alert Pain management: pain level controlled Vital Signs Assessment: post-procedure vital signs reviewed and stable Respiratory status: spontaneous breathing, nonlabored ventilation, respiratory function stable and patient connected to nasal cannula oxygen Cardiovascular status: blood pressure returned to baseline and stable Postop Assessment: no apparent nausea or vomiting Anesthetic complications: no    Last Vitals:  Vitals:   05/02/19 1716 05/02/19 1938  BP: 130/71 (!) 84/44  Pulse: (!) 58 61  Resp: 18 18  Temp: 36.8 C 36.5 C  SpO2: 97% 95%    Last Pain:  Vitals:   05/02/19 1938  TempSrc: Oral  PainSc:                  [name]

## 2019-05-02 NOTE — Progress Notes (Signed)
Pharmacy Antibiotic Note  Debbie Hayes is a 67 y.o. female admitted on 05/02/2019 with surgical prophylaxis.  Pharmacy has been consulted for Vancomycin dosing.  No drain in place.  Patient received Vancomycin 1000mg  at 10:34AM.   Plan: Vancomycin 1000mg  IV x1 at 2230 PM.  No further vancomycin needed as no drain in place.  Pharmacy will sign off - please re-consult if needed.   Height: 5\' 8"  (172.7 cm) Weight: 180 lb 14.4 oz (82.1 kg) IBW/kg (Calculated) : 63.9  Temp (24hrs), Avg:97.7 F (36.5 C), Min:97.2 F (36.2 C), Max:98.3 F (36.8 C)  No results for input(s): WBC, CREATININE, LATICACIDVEN, VANCOTROUGH, VANCOPEAK, VANCORANDOM, GENTTROUGH, GENTPEAK, GENTRANDOM, TOBRATROUGH, TOBRAPEAK, TOBRARND, AMIKACINPEAK, AMIKACINTROU, AMIKACIN in the last 168 hours.  Estimated Creatinine Clearance: 74.6 mL/min (by C-G formula based on SCr of 0.74 mg/dL).    Allergies  Allergen Reactions  . Statins Other (See Comments)    Pt has tried multiple and cannot tolerate  . Sulfa Antibiotics Hives    Antimicrobials this admission: Vanc x1 pre-op  Dose adjustments this admission:   Microbiology results: none  Thank you for allowing pharmacy to be a part of this patient's care.  Brain Hilts 05/02/2019 5:25 PM

## 2019-05-02 NOTE — Anesthesia Procedure Notes (Addendum)
Procedure Name: Intubation Performed by: Valda Favia, CRNA Pre-anesthesia Checklist: Patient identified, Emergency Drugs available, Suction available, Patient being monitored and Timeout performed Patient Re-evaluated:Patient Re-evaluated prior to induction Oxygen Delivery Method: Circle system utilized Preoxygenation: Pre-oxygenation with 100% oxygen Induction Type: IV induction Ventilation: Mask ventilation without difficulty Laryngoscope Size: Mac and 3 Grade View: Grade I Tube type: Oral Tube size: 7.0 mm Number of attempts: 1 Airway Equipment and Method: Stylet Placement Confirmation: ETT inserted through vocal cords under direct vision,  positive ETCO2 and breath sounds checked- equal and bilateral Secured at: 21 cm Tube secured with: Tape Dental Injury: Teeth and Oropharynx as per pre-operative assessment

## 2019-05-02 NOTE — Op Note (Signed)
Brief history: The patient is a 68 year old white female on whom I previously performed an L4-5 decompression, instrumentation and fusion.  She initially did well but has developed recurrent back pain.  She has failed medical management.  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  I discussed the various treatment options with her.  She has decided to proceed with surgery after weighing the risks, benefits and alternatives.  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 Etc. requiring a wide decompression of the nerve roots.);  L3-4 transforaminal lumbar interbody fusion with local morselized autograft bone and Zimmer DBM; insertion of interbody prosthesis at L3-4 (globus peek expandable interbody prosthesis); posterior segmental instrumentation from L3 to L5 with globus titanium pedicle screws and rods; posterior lateral arthrodesis at L3-4 and L4-5 bilaterally with local morselized autograft bone and Zimmer DBM; exploration of lumbar fusion/removal of lumbar hardware.  Surgeon: Dr. Earle Gell  Asst.: Arnetha Massy nurse practitioner  Anesthesia: Gen. endotracheal  Estimated blood loss: 300 cc  Drains: None  Complications: None  Description of procedure: The patient was brought to the operating room by the anesthesia team. General endotracheal anesthesia was induced. The  patient was turned to the prone position on the Wilson frame. The patient's lumbosacral region was then prepared with Betadine scrub and Betadine solution. Sterile drapes were applied.  I then injected the area to be incised with Marcaine with epinephrine solution. I then used the scalpel to make a linear midline incision over the L3-4 and L4-5 interspace, incising through the old surgical scar. I then used electrocautery to perform a bilateral subperiosteal dissection exposing the spinous process and lamina of L3, L4 and L5, and exposing the old hardware at L4-5. We then inserted the Verstrac retractor to provide exposure.  We explored the fusion by removing the old caps and rods at L4-5.  We could not remove the cap from the right L4 pedicle screw.  We therefore had to cut the rod at L4-5 on the right.  We then backed up the right L4 pedicle screw using the antitorque wrench.  We then replaced the screw with another 7.5 x 50 mm screw using the same hole at L4 on the right.  I began the decompression by using the high speed drill to perform laminotomies at L3-4 bilaterally. We then used the Kerrison punches to widen the laminotomy and removed the ligamentum flavum at L3-4 bilaterally.  We noted that the L4 lamina was quite loose indicative of a spondylolysis.  We therefore decided to perform a repeat L4-5 laminectomy.  We used the Kerrison punches to remove the medial facets at L3-4 bilaterally and to complete the redo laminectomy at L4-5. We performed wide foraminotomies about the bilateral L4 and L5 nerve roots completing the decompression.  We now turned our attention to the posterior lumbar interbody fusion. I used a scalpel to incise the intervertebral disc at L3-4 bilaterally. I then performed a partial intervertebral discectomy at L3-4 bilaterally using the pituitary forceps. We prepared the vertebral endplates  at L3-4 bilaterally for the fusion by removing the soft tissues with the curettes. We then  used the trial spacers to pick the appropriate sized interbody prosthesis. We prefilled his prosthesis with a combination of local morselized autograft bone that we obtained during the decompression as well as Zimmer DBM. We inserted the prefilled prosthesis into the interspace at L3-4, we then turned and expanded the prosthesis. There was a good snug fit of the prosthesis in the interspace. We then filled and the remainder of the intervertebral disc space with local morselized autograft bone and Zimmer DBM. This completed the transforaminal lumbar interbody arthrodesis at L3-4.  We now turned attention to the instrumentation. Under fluoroscopic guidance we cannulated the bilateral L3 pedicles with the bone probe. We then removed the bone probe. We then tapped the pedicle with a 6.5 millimeter tap. We then removed the tap. We probed inside the tapped pedicle with a ball probe to rule out cortical breaches. We then inserted a 7.5 x 50 millimeter pedicle screw into the L3 pedicles bilaterally under fluoroscopic guidance. We then palpated along the medial aspect of the pedicles to rule out cortical breaches. There were none. The nerve roots were not injured. We then connected the unilateral pedicle screws from L3-L5 with a lordotic rod. We compressed the construct and secured the rod in place with the caps.  We placed a cross connector between the rods.  We then tightened the caps appropriately. This completed the instrumentation from L3-L5 bilaterally.  We now turned our attention to the posterior lateral arthrodesis at L3-4 and L4-5 (because we had difficulty getting the cap off of the right L4 pedicle screw and had to remove and replace the screw at L4 on the right, I decided to redo the posterior lateral arthrodesis at L4-5). We used the high-speed drill to decorticate the remainder of the facets, pars, transverse process at L3-4 and L4-5. We then applied a combination of local morselized autograft bone and  Zimmer DBM over these decorticated posterior lateral structures. This completed the posterior lateral arthrodesis at L3-4 and L4-5.  We then obtained hemostasis using bipolar electrocautery. We irrigated the wound out with bacitracin solution. We inspected the thecal sac and nerve roots and noted they were well decompressed. We then removed the retractor.  We injected Exparel . We reapproximated patient's thoracolumbar fascia with interrupted #1 Vicryl suture. We reapproximated patient's subcutaneous tissue with interrupted 2-0 Vicryl suture. The reapproximated patient's skin with Steri-Strips and benzoin. The wound was then coated with bacitracin ointment. A sterile dressing was applied. The drapes were removed. The patient was subsequently returned to the supine position where they were extubated by the anesthesia team. He was then transported to the post anesthesia care unit in stable condition. All sponge instrument and needle counts were reportedly correct at the end of this case.

## 2019-05-03 ENCOUNTER — Encounter: Payer: Self-pay | Admitting: *Deleted

## 2019-05-03 LAB — BASIC METABOLIC PANEL
Anion gap: 9 (ref 5–15)
BUN: 11 mg/dL (ref 8–23)
CO2: 24 mmol/L (ref 22–32)
Calcium: 8.3 mg/dL — ABNORMAL LOW (ref 8.9–10.3)
Chloride: 106 mmol/L (ref 98–111)
Creatinine, Ser: 0.64 mg/dL (ref 0.44–1.00)
GFR calc Af Amer: >60 mL/min (ref >60)
GFR calc non Af Amer: >60 mL/min (ref >60)
Glucose, Bld: 160 mg/dL — ABNORMAL HIGH (ref 70–99)
Potassium: 3.6 mmol/L (ref 3.5–5.1)
Sodium: 139 mmol/L (ref 135–145)

## 2019-05-03 LAB — CBC
HCT: 35 % — ABNORMAL LOW (ref 36.0–46.0)
Hemoglobin: 10.9 g/dL — ABNORMAL LOW (ref 12.0–15.0)
MCH: 26 pg (ref 26.0–34.0)
MCHC: 31.1 g/dL (ref 30.0–36.0)
MCV: 83.3 fL (ref 80.0–100.0)
Platelets: 188 10*3/uL (ref 150–400)
RBC: 4.2 MIL/uL (ref 3.87–5.11)
RDW: 14.9 % (ref 11.5–15.5)
WBC: 10.1 10*3/uL (ref 4.0–10.5)
nRBC: 0 % (ref 0.0–0.2)

## 2019-05-03 LAB — GLUCOSE, CAPILLARY: Glucose-Capillary: 158 mg/dL — ABNORMAL HIGH (ref 70–99)

## 2019-05-03 MED ORDER — DOCUSATE SODIUM 100 MG PO CAPS: 100.0000 mg | ORAL_CAPSULE | Freq: Two times a day (BID) | ORAL | 0 refills | Status: DC

## 2019-05-03 MED ORDER — CYCLOBENZAPRINE HCL 10 MG PO TABS: 10.0000 mg | ORAL_TABLET | Freq: Three times a day (TID) | ORAL | 1 refills | Status: DC | PRN

## 2019-05-03 MED ORDER — OXYCODONE HCL 10 MG PO TABS: 10.0000 mg | ORAL_TABLET | ORAL | 0 refills | Status: DC | PRN

## 2019-05-03 MED FILL — CYCLOBENZAPRINE HCL 10 MG T: 10 | 17 days supply | Qty: 50 | Fill #0

## 2019-05-03 MED FILL — DOK 100 MG SOFTGEL: 100 | 60 days supply | Qty: 60 | Fill #0

## 2019-05-03 MED FILL — oxyCODONE HCL 10 MG TABS: 10 | 5 days supply | Qty: 30 | Fill #0

## 2019-05-03 NOTE — Discharge Summary (Signed)
Physician Discharge Summary  Patient ID: Debbie Hayes MRN: [id number] DOB/AGE: 1950/10/01 68 y.o.  Admit date: 05/02/2019 Discharge date: 05/03/2019  Admission Diagnoses: Lumbar adjacent disease with spondylolisthesis, lumbar degenerative disc disease, lumbar facet arthropathy, lumbago  Discharge Diagnoses: The same Active Problems:   Spondylolisthesis of lumbar region   Discharged Condition: good  Hospital Course: I performed an exploration of the patient's lumbar fusion with an L3-4 and L4-5 decompression, instrumentation and fusion on the patient on 05/02/2019.  The surgery went well.  The patient's postoperative course was unremarkable.  On postoperative day #1 she requested discharge to home.  She was given written and oral discharge instructions.  All her questions were answered.  Consults: Physical therapy, Occupational Therapy, care management Significant Diagnostic Studies: None Treatments: Exploration of lumbar fusion, removal of lumbar hardware, L3-4 and L4-5 decompression, instrumentation and fusion. Discharge Exam: Blood pressure 101/63, pulse 67, temperature 98.3 F (36.8 C), temperature source Oral, resp. rate 18, height '5\' 8"'$  (1.727 m), weight 82.1 kg, SpO2 97 %. The patient is alert and pleasant.  She looks well.  Her strength is normal.  Her dressing is clean and dry.  Disposition: Home  Discharge Instructions    Call MD for:  difficulty breathing, headache or visual disturbances   Complete by: As directed    Call MD for:  extreme fatigue   Complete by: As directed    Call MD for:  hives   Complete by: As directed    Call MD for:  persistant dizziness or light-headedness   Complete by: As directed    Call MD for:  persistant nausea and vomiting   Complete by: As directed    Call MD for:  redness, tenderness, or signs of infection (pain, swelling, redness, odor or green/yellow discharge around incision site)   Complete by: As directed    Call MD for:   severe uncontrolled pain   Complete by: As directed    Call MD for:  temperature >100.4   Complete by: As directed    Diet - low sodium heart healthy   Complete by: As directed    Discharge instructions   Complete by: As directed    Call [phone] for a followup appointment. Take a stool softener while you are using pain medications.   Driving Restrictions   Complete by: As directed    Do not drive for 2 weeks.   Increase activity slowly   Complete by: As directed    Lifting restrictions   Complete by: As directed    Do not lift more than 5 pounds. No excessive bending or twisting.   May shower / Bathe   Complete by: As directed    Remove the dressing for 3 days after surgery.  You may shower, but leave the incision alone.   Remove dressing in 48 hours   Complete by: As directed    Your stitches are under the scan and will dissolve by themselves. The Steri-Strips will fall off after you take a few showers. Do not rub back or pick at the wound, Leave the wound alone.     Allergies as of 05/03/2019      Reactions   Statins Other (See Comments)   Pt has tried multiple and cannot tolerate   Sulfa Antibiotics Hives      Medication List    STOP taking these medications   acetaminophen 500 MG tablet Commonly known as: TYLENOL   diclofenac 1.3 % Ptch Commonly known as: Teacher, early years/pre  naproxen sodium 220 MG tablet Commonly known as: ALEVE   nitrofurantoin 50 MG capsule Commonly known as: MACRODANTIN   oxyCODONE HCl 7.5 MG Taba Replaced by: Oxycodone HCl 10 MG Tabs   zolpidem 5 MG tablet Commonly known as: AMBIEN     TAKE these medications   blood glucose meter kit and supplies Kit Test blood sugar once daily. Dx code: E11.9   cyclobenzaprine 10 MG tablet Commonly known as: FLEXERIL Take 1 tablet (10 mg total) by mouth 3 (three) times daily as needed for muscle spasms.   dapagliflozin propanediol 10 MG Tabs tablet Commonly known as: FARXIGA Take 10 mg by mouth  daily.   docusate sodium 100 MG capsule Commonly known as: COLACE Take 1 capsule (100 mg total) by mouth 2 (two) times daily.   esomeprazole 40 MG capsule Commonly known as: NEXIUM Take 40 mg by mouth daily as needed (acid reflux).   freestyle lancets Check blood sugars no more than twice daily   FreeStyle Lite Devi Check blood sugar no more than twice daily   glucose blood test strip Commonly known as: FREESTYLE LITE CHECK BLOOD SUGARS NO MORE THAN TWICE DAILY   ICaps Areds 2 Caps Take 1 capsule by mouth 2 (two) times daily.   Januvia 100 MG tablet Generic drug: sitaGLIPtin TAKE 1 TABLET BY MOUTH DAILY. What changed: how much to take   levothyroxine 125 MCG tablet Commonly known as: SYNTHROID TAKE 1 TABLET (125 MCG TOTAL) BY MOUTH DAILY BEFORE BREAKFAST.   loratadine 10 MG tablet Commonly known as: CLARITIN Take 10 mg by mouth at bedtime.   losartan 50 MG tablet Commonly known as: COZAAR Take 1 tablet (50 mg total) by mouth daily.   metoprolol succinate 50 MG 24 hr tablet Commonly known as: TOPROL-XL Take 50 mg by mouth daily. Take with or immediately following a meal.   Nasonex 50 MCG/ACT nasal spray Generic drug: mometasone PLACE 2 SPRAYS INTO THE NOSE DAILY. What changed: See the new instructions.   ondansetron 4 MG tablet Commonly known as: ZOFRAN TAKE 1 TABLET BY MOUTH EVERY 8 HOURS AS NEEDED FOR NAUSEA AND VOMITING   Oxycodone HCl 10 MG Tabs Take 1 tablet (10 mg total) by mouth every 4 (four) hours as needed for severe pain ((score 7 to 10)). Replaces: oxyCODONE HCl 7.5 MG Taba   ROBITUSSIN CF PO Take 2 capsules by mouth daily.   topiramate ER 100 MG Cs24 sprinkle capsule Commonly known as: Qudexy XR Take 1 capsule (100 mg total) by mouth at bedtime.   traZODone 100 MG tablet Commonly known as: DESYREL TAKE 2.5 TABLETS BY MOUTH AT BEDTIME. What changed: See the new instructions.   Ubrogepant 50 MG Tabs Commonly known as: Ubrelvy Take 50 mg  by mouth every 2 (two) hours as needed. Max '200mg'$  a day What changed: reasons to take this        Signed: Ophelia Charter 05/03/2019, 7:52 AM

## 2019-05-03 NOTE — Progress Notes (Signed)
Pt and husband given D/C instructions with verbal understanding. Rx's were sent to pharmacy by MD. Pt's incision is clean and dry with no sign of infection. Pt's IV was removed prior to D/C. Pt D/C'd home via wheelchair per MD order. Pt is stable @ D/C and has no other needs at this time. Nashon Erbes, RN  

## 2019-05-03 NOTE — Discharge Instructions (Signed)
Call MD for: Difficulty breathing, headache or visual disturbances, extreme fatigue  °Call MD for: hives ° Call MD for: persistant dizziness or light-headedness ° Call MD for: persistant nausea and vomiting  °Call MD for: redness, tenderness, or signs of infection (pain, swelling, redness, odor or green/yellow discharge around incision site)  °Call MD for: severe uncontrolled pain  °Call MD for: temperature >100.4 Diet - low sodium heart healthy Discharge instructions  ° Call [phone] for a followup appointment. °  °Increase activity slowly Remove dressing in 48 hours  ° ° ° °Spinal Fusion, Adult, Care After °This sheet gives you information about how to care for yourself after your procedure. Your doctor may also give you more specific instructions. If you have problems or questions, contact your doctor. °Follow these instructions at home: °Medicines °· Take over-the-counter and prescription medicines only as told by your doctor. These include any medicines for pain or blood-thinning medicines (anticoagulants). °· If you were prescribed an antibiotic medicine, take it as told by your doctor. Do not stop taking the antibiotic even if you start to feel better. °· Do not drive for 24 hours if you were given a medicine to help you relax (sedative) during your procedure. °· Do not drive or use heavy machinery while taking prescription pain medicine. °If you have a brace: °· Wear the brace as told by your doctor. Take it off only as told by your doctor. °· Keep the brace clean. °Managing pain, stiffness, and swelling °· If directed, put ice on the surgery area: °? If you have a removable brace, take it off as told by your doctor. °? Put ice in a plastic bag. °? Place a towel between your skin and the bag. °? Leave the ice on for 20 minutes, 2-3 times a day. °Surgery cut care ° °· Follow instructions from your doctor about how to take care of your cut from surgery (incision). Make sure you: °? Wash your hands with  soap and water before you change your bandage (dressing). If you cannot use soap and water, use hand sanitizer. °? Change your bandage as told by your doctor. °? Leave stitches (sutures), skin glue, or skin tape (adhesive) strips in place. They may need to stay in place for 2 weeks or longer. If tape strips get loose and curl up, you may trim the loose edges. Do not remove tape strips completely unless your doctor says it is okay. °· Keep your cut from surgery clean and dry. °? Do not take baths, swim, or use a hot tub until your doctor says it is okay. °? Ask your doctor if you can take showers. You may only be allowed to take sponge baths. °· Every day, check your cut from surgery and the area around it for: °? More redness, swelling, or pain. °? Fluid or blood. °? Warmth. °? Pus or a bad smell. °· If you have a drain tube, follow instructions from your doctor about caring for it. Do not take out the drain tube or any bandages unless your doctor says it is okay. °Physical activity °· Rest and protect your back as much as possible. °· Follow instructions from your doctor about how to move. Use good posture to help your spine heal. °· Do not lift anything that is heavier than 8 lb (3.6 kg), or the limit that you are told, until your doctor says that it is safe. °· Do not twist or bend at the waist until your doctor says it   is okay.  It is best if you: ? Do not make pushing and pulling motions. ? Do not sit or lie down in the same position for a long time. ? Do not raise your hands or arms above your head.  Return to your normal activities as told by your doctor. Ask your doctor what activities are safe for you. Rest and protect your back as much as you can.  Do not start to exercise until your doctor says it is okay. Ask your doctor what kinds of exercise you can do to make your back stronger. General instructions  To prevent blood clots and lessen swelling in your legs: ? Wear compression stockings as  told. ? Walk one or more times every few hours as told by your doctor.  Do not use any products that contain nicotine or tobacco, such as cigarettes and e-cigarettes. These can delay bone healing. If you need help quitting, ask your doctor.  To prevent or treat constipation while you are taking prescription pain medicine, your doctor may suggest that you: ? Drink enough fluid to keep your pee (urine) pale yellow. ? Take over-the-counter or prescription medicines. ? Eat foods that are high in fiber. These include fresh fruits and vegetables, whole grains, and beans. ? Limit foods that are high in fat and processed sugars, such as fried and sweet foods.  Keep all follow-up visits as told by your doctor. This is important. Contact a doctor if:  Your pain gets worse.  Your medicine does not help your pain.  Your legs or feet get painful or swollen.  Your cut from surgery is more red, swollen, or painful.  Your cut from surgery feels warm to the touch.  You have: ? Fluid or blood coming from your cut from surgery. ? Pus or a bad smell coming from your cut from surgery. ? A fever. ? Weakness or loss of feeling (numbness) in your legs that is new or getting worse. ? Trouble controlling when you pee (urinate) or poop (have a bowel movement).  You feel sick to your stomach (nauseous).  You throw up (vomit). Get help right away if:  Your pain is very bad.  You have chest pain.  You have trouble breathing.  You start to have a cough. These symptoms may be an emergency. Do not wait to see if the symptoms will go away. Get medical help right away. Call your local emergency services (911 in the [place] Do not drive yourself to the hospital. Summary  After the procedure, it is common to have pain in your back and pain by your surgery cut(s).  Icing and pain medicines may help to control the pain. Follow directions from your doctor.  Rest and protect your back as much as possible. Do  not twist or bend at the waist.  Get up and walk one or more times every few hours as told by your doctor. This information is not intended to replace advice given to you by your health care provider. Make sure you discuss any questions you have with your health care provider. Document Released: 11/07/2010 Document Revised: 11/04/2018 Document Reviewed: 10/28/2016 Elsevier Patient Education  [place].

## 2019-05-03 NOTE — Evaluation (Signed)
Occupational Therapy Evaluation Patient Details Name: Debbie Hayes MRN: [id number] DOB: 04/23/1951 Today's Date: 05/03/2019    History of Present Illness Pt is a 68 y/o female s/p L3-L4 PLIF, removal of hardware L4-5. PMH: thyroid CA, cataract, migranes, DM, fibromyalgia, HTN, macular degeneration, previous back sx.    Clinical Impression   PTA patient independent.  Admitted for above and limited by problem list below, including back pain.  Patient educated on back precautions, brace order (but patient reports she cleared with MD for not wearing brace and declines to don), ADL compensatory techniques, recommendations, energy conservation and safety.  Patient demonstrates ability to complete LB ADLs and transfers/in room mobility with supervision.  Will have support of spouse at dc.  No further questions or concerns.  OT signing off.     Follow Up Recommendations  No OT follow up;Supervision - Intermittent    Equipment Recommendations  None recommended by OT    Recommendations for Other Services PT consult     Precautions / Restrictions Precautions Precautions: Back Precaution Booklet Issued: Yes (comment) Precaution Comments: reviewed back precautions with pt  Required Braces or Orthoses: Spinal Brace Spinal Brace: Other (comment)(order per chart by patient declines wearing brace ) Restrictions Weight Bearing Restrictions: No      Mobility Bed Mobility               General bed mobility comments: OOB in recliner upon entry  Transfers Overall transfer level: Needs assistance   Transfers: Sit to/from Stand Sit to Stand: Supervision         General transfer comment: no assist required, supervision for safety    Balance Overall balance assessment: Mild deficits observed, not formally tested                                         ADL either performed or assessed with clinical judgement   ADL Overall ADL's : Needs assistance/impaired      Grooming: Modified independent;Standing   Upper Body Bathing: Set up;Sitting   Lower Body Bathing: Set up;Sit to/from stand;Supervison/ safety;Cueing for compensatory techniques;Cueing for back precautions   Upper Body Dressing : Supervision/safety;Set up;Sitting   Lower Body Dressing: Supervision/safety;Set up;Sit to/from stand;Cueing for compensatory techniques;Cueing for back precautions Lower Body Dressing Details (indicate cue type and reason): pt able to complete figure 4 technique to manage LB self care, donning socks, shoes and pants; educated on compensatory techniques Toilet Transfer: Supervision/safety;Ambulation Toilet Transfer Details (indicate cue type and reason): simulated in room Toileting- Clothing Manipulation and Hygiene: Supervision/safety;Sit to/from stand   Tub/ Shower Transfer: Walk-in shower;Supervision/safety;Ambulation;Shower Scientist, research (medical) Details (indicate cue type and reason): reviewed side stepping technique over threshold with supervision  Functional mobility during ADLs: Supervision/safety General ADL Comments: patient educated on back precautions ,ADL compensatory techniques, safety, energy conservation      Vision   Vision Assessment?: No apparent visual deficits     Perception     Praxis      Pertinent Vitals/Pain Pain Assessment: Faces Faces Pain Scale: Hurts little more Pain Location: back, incisional Pain Descriptors / Indicators: Discomfort;Operative site guarding Pain Intervention(s): Monitored during session;Limited activity within patient's tolerance;Repositioned     Hand Dominance     Extremity/Trunk Assessment Upper Extremity Assessment Upper Extremity Assessment: Overall WFL for tasks assessed   Lower Extremity Assessment Lower Extremity Assessment: Defer to PT evaluation   Cervical / Trunk Assessment Cervical /  Trunk Assessment: Other exceptions Cervical / Trunk Exceptions: s/p PLIF   Communication  Communication Communication: No difficulties   Cognition Arousal/Alertness: Awake/alert Behavior During Therapy: WFL for tasks assessed/performed Overall Cognitive Status: Within Functional Limits for tasks assessed                                     General Comments       Exercises     Shoulder Instructions      Home Living Family/patient expects to be discharged to:: Private residence Living Arrangements: Spouse/significant other Available Help at Discharge: Family Type of Home: House Home Access: Stairs to enter     Home Layout: One level     Bathroom Shower/Tub: Occupational psychologist: Standard     Home Equipment: Shower seat;Bedside commode          Prior Functioning/Environment Level of Independence: Independent                 OT Problem List: Decreased activity tolerance;Decreased knowledge of use of DME or AE;Decreased knowledge of precautions;Pain      OT Treatment/Interventions:      OT Goals(Current goals can be found in the care plan section) Acute Rehab OT Goals Patient Stated Goal: home today OT Goal Formulation: With patient  OT Frequency:     Barriers to D/C:            Co-evaluation              AM-PAC OT "6 Clicks" Daily Activity     Outcome Measure Help from another person eating meals?: None Help from another person taking care of personal grooming?: None Help from another person toileting, which includes using toliet, bedpan, or urinal?: None Help from another person bathing (including washing, rinsing, drying)?: A Little Help from another person to put on and taking off regular upper body clothing?: None Help from another person to put on and taking off regular lower body clothing?: A Little 6 Click Score: 22   End of Session Nurse Communication: Mobility status  Activity Tolerance: Patient tolerated treatment well Patient left: in chair;with call bell/phone within reach  OT Visit  Diagnosis: Unsteadiness on feet (R26.81);Pain Pain - part of body: (back)                Time: [id number] OT Time Calculation (min): 14 min Charges:  OT General Charges $OT Visit: 1 Visit OT Evaluation $OT Eval Low Complexity: 1 Low  Delight Stare, OT Acute Rehabilitation Services Pager [phone] Office [phone]   Delight Stare 05/03/2019, 9:45 AM

## 2019-05-03 NOTE — Evaluation (Signed)
Physical Therapy Evaluation and Discharge Patient Details Name: Debbie Hayes MRN: [id number] DOB: January 23, 1951 Today's Date: 05/03/2019   History of Present Illness  Pt is a 68 y/o female s/p L3-L4 PLIF, removal of hardware L4-5. PMH: thyroid CA, cataract, migranes, DM, fibromyalgia, HTN, macular degeneration, previous back sx.   Clinical Impression  Patient evaluated by Physical Therapy with no further acute PT needs identified. All education has been completed and the patient has no further questions. Pt was able to demonstrate transfers and ambulation with gross supervision for safety. Pt was educated on precautions, appropriate activity progression, positioning recommendations, and car transfer. Anticipate she will progress well upon d/c home. See below for any follow-up Physical Therapy or equipment needs. PT is signing off. Thank you for this referral.     Follow Up Recommendations No PT follow up;Supervision for mobility/OOB    Equipment Recommendations  None recommended by PT    Recommendations for Other Services       Precautions / Restrictions Precautions Precautions: Back Precaution Booklet Issued: Yes (comment) Precaution Comments: reviewed back precautions with pt  Required Braces or Orthoses: Spinal Brace Spinal Brace: Other (comment)(order per chart but patient declines wearing brace ) Restrictions Weight Bearing Restrictions: No      Mobility  Bed Mobility Overal bed mobility: Needs Assistance Bed Mobility: Rolling;Sidelying to Sit Rolling: Modified independent (Device/Increase time) Sidelying to sit: Supervision       General bed mobility comments: Increased time and effort but able to complete without assistance.  Transfers Overall transfer level: Needs assistance Equipment used: None Transfers: Sit to/from Stand Sit to Stand: Supervision         General transfer comment: no assist required, supervision for  safety  Ambulation/Gait Ambulation/Gait assistance: Supervision Gait Distance (Feet): 250 Feet Assistive device: (railing in hall) Gait Pattern/deviations: Step-through pattern;Decreased stride length;Trunk flexed Gait velocity: Decreased Gait velocity interpretation: <1.8 ft/sec, indicate of risk for recurrent falls General Gait Details: Intermittent use of railing in hall for support. Declines wanting to try a SPC instead. No overt LOB noted.  Stairs Stairs: (declined stair training)          Wheelchair Mobility    Modified Rankin (Stroke Patients Only)       Balance Overall balance assessment: Mild deficits observed, not formally tested                                           Pertinent Vitals/Pain Pain Assessment: Faces Faces Pain Scale: Hurts a little bit Pain Location: back, incisional Pain Descriptors / Indicators: Operative site guarding Pain Intervention(s): Limited activity within patient's tolerance;Monitored during session;Repositioned    Home Living Family/patient expects to be discharged to:: Private residence Living Arrangements: Spouse/significant other Available Help at Discharge: Family;Available 24 hours/day(first week) Type of Home: House Home Access: Stairs to enter Entrance Stairs-Rails: None Entrance Stairs-Number of Steps: 2 Home Layout: One level Home Equipment: Shower seat;Bedside commode;Walker - 2 wheels;Cane - single point      Prior Function Level of Independence: Independent               Hand Dominance   Dominant Hand: Right    Extremity/Trunk Assessment   Upper Extremity Assessment Upper Extremity Assessment: Defer to OT evaluation    Lower Extremity Assessment Lower Extremity Assessment: Generalized weakness(consistent with pre-op diagnosis)    Cervical / Trunk Assessment Cervical / Trunk Assessment: Other  exceptions Cervical / Trunk Exceptions: s/p PLIF  Communication   Communication: No  difficulties  Cognition Arousal/Alertness: Awake/alert Behavior During Therapy: WFL for tasks assessed/performed Overall Cognitive Status: Within Functional Limits for tasks assessed                                        General Comments      Exercises     Assessment/Plan    PT Assessment Patent does not need any further PT services  PT Problem List         PT Treatment Interventions      PT Goals (Current goals can be found in the Care Plan section)  Acute Rehab PT Goals Patient Stated Goal: home today PT Goal Formulation: All assessment and education complete, DC therapy    Frequency     Barriers to discharge        Co-evaluation               AM-PAC PT "6 Clicks" Mobility  Outcome Measure Help needed turning from your back to your side while in a flat bed without using bedrails?: None Help needed moving from lying on your back to sitting on the side of a flat bed without using bedrails?: None Help needed moving to and from a bed to a chair (including a wheelchair)?: None Help needed standing up from a chair using your arms (e.g., wheelchair or bedside chair)?: None Help needed to walk in hospital room?: None Help needed climbing 3-5 steps with a railing? : None 6 Click Score: 24    End of Session Equipment Utilized During Treatment: Gait belt Activity Tolerance: Patient tolerated treatment well Patient left: in chair;with call bell/phone within reach Nurse Communication: Mobility status PT Visit Diagnosis: Unsteadiness on feet (R26.81);Pain Pain - part of body: (back)    Time: [id number] PT Time Calculation (min) (ACUTE ONLY): 19 min   Charges:   PT Evaluation $PT Eval Low Complexity: 1 Low          Rolinda Roan, PT, DPT Acute Rehabilitation Services Pager: [phone] Office: [phone]   Thelma Comp 05/03/2019, 9:57 AM

## 2019-05-04 LAB — SURGICAL PATHOLOGY

## 2019-05-04 MED FILL — Heparin Sodium (Porcine) Inj 1000 Unit/ML: INTRAMUSCULAR | Qty: 30 | Status: AC

## 2019-05-04 MED FILL — Sodium Chloride IV Soln 0.9%: INTRAVENOUS | Qty: 1000 | Status: AC

## 2019-05-05 ENCOUNTER — Other Ambulatory Visit: Payer: Self-pay | Admitting: *Deleted

## 2019-05-05 NOTE — Patient Outreach (Signed)
[facility]) Care Management  05/05/2019  LYNLEA HAUER 1951/02/02 [id number]  Transition of care telephone call  Referral received: 04/28/19 Initial outreach: 05/05/19 Insurance: [place]  Initial unsuccessful telephone call to patient's preferred number in order to complete transition of care assessment; no answer, left HIPAA compliant voicemail message requesting return call.   Objective: Per the electronic medical record, Aulora Konja was hospitalized at [facility] on 05/02/19 for L4-5 redo laminectomy; bilateral L3-4 laminotomy/foraminotomies/medial facetectomy to decompress the bilateral L3 and L4 nerve roots(the work required to do this was in addition to the work required to do the posterior lumbar interbody fusion because of the patient's spinal stenosis, facet arthropathy. Etc. requiring a wide decompression of the nerve roots.);  L3-4 transforaminal lumbar interbody fusion with local morselized autograft bone and Zimmer DBM; insertion of interbody prosthesis at L3-4 (globus peek expandable interbody prosthesis); posterior segmental instrumentation from L3 to L5 with globus titanium pedicle screws and rods; posterior lateral arthrodesis at L3-4 and L4-5 bilaterally with local morselized autograft bone and Zimmer DBM; exploration of lumbar fusion/removal of lumbar hardware. Comorbidities include: chronic migraine, HTN, hyperlipidemia, OSA- wears mouth guard, GERD, Type 2 DM with most recent Hgb A1C= 7.4% on 04/18/19, Hypothyroidism, TMJ Syndrome, Fibromyalgia, insomnia, macular degeneration, Hx of MRSA infection, chronic neck pain She was discharged to home on 05/03/19 without the need for home health services or durable medical equipment as she already had a walker and cane at home per chart review.   Plan: This RNCM will route unsuccessful outreach letter with [place] Management pamphlet and 24 hour Nurse Advice Line  Magnet to [place] [place] Management clinical pool to be mailed to patient's home address. This RNCM will attempt another outreach within 4 business days.  Barrington Ellison RN,CCM,CDE [place] Management Coordinator Office Phone [phone] Office Fax [phone]

## 2019-05-09 MED FILL — traZODone HCL 100 MG TABS: 100 | 90 days supply | Qty: 225 | Fill #1

## 2019-05-10 ENCOUNTER — Encounter: Payer: Self-pay | Admitting: *Deleted

## 2019-05-10 ENCOUNTER — Other Ambulatory Visit: Payer: Self-pay | Admitting: *Deleted

## 2019-05-10 NOTE — Patient Outreach (Signed)
[facility]) Care Management  05/10/2019  Debbie Hayes October 09, 1950 [id number]   Transition of care call/case closure   Referral received: 04/28/19 Initial outreach: 05/05/19 Insurance: [place] Choice Plan   Subjective: Second attempt to reach patient at her contact number successful. 2 HIPAA identifiers verified. Explained purpose of call and completed transition of care assessment.  Debbie Hayes states she is doing well, denies post-operative problems, says surgical incisions are unremarkable, says she developed 2 blisters form the dressing adhesive and her husband applied tegaderm dressing to them as they were oozing clear liquid. She states surgical pain well managed with prescribed medications, tolerating diet, denies bowel or bladder problems.   Her husband, Dr Ruben Reason, is assisting with her recovery.  She states she is an active member of [place]'s  chronic disease management program, [employer].   She says she does have the hospital indemnity insurance and her husband will file the claim with [employer].  She says she uses a [facility] outpatient pharmacy- [place].   She denies educational needs related to staying safe during the COVID 19 pandemic.    Objective:  Per the electronic medical record, Debbie Hayes was hospitalized at [facility] on 05/02/19 for L4-5 redo laminectomy; bilateral L3-4 laminotomy/foraminotomies/medial facetectomy to decompress the bilateralL3 and L4 nerve roots(the work required to do this was in addition to the work required to do the posterior lumbar interbody fusion because of the patient's spinal stenosis, facet arthropathy. Etc. requiring a wide decompression of the nerve roots.);L3-4transforaminal lumbar interbody fusion with local morselized autograft bone and Zimmer DBM; insertion of interbody prosthesis atL3-4 (globus peek expandable interbody prosthesis); posteriorsegmental  instrumentation fromL3toL5 with globus titanium pedicle screws and rods; posterior lateral arthrodesis atL3-4 and L4-5 bilaterally with local morselized autograft bone and Zimmer DBM; exploration of lumbar fusion/removal of lumbar hardware. Comorbidities include: chronic migraine, HTN, hyperlipidemia, OSA- wears mouth guard, GERD, Type 2 DM with most recent Hgb A1C= 7.4% on 04/18/19, Hypothyroidism, TMJ Syndrome, Fibromyalgia, insomnia, macular degeneration, Hx of MRSA infection, chronic neck pain She was discharged to home on 05/03/19 without the need for home health services or durable medical equipment as she already had a walker and cane at home per chart review.   Assessment:  Patient voices good understanding of all discharge instructions.  See transition of care flowsheet for assessment details.   Plan:  Reviewed hospital discharge diagnosis of posterior lumbar interbody fusion L3-L4  and discharge treatment plan using hospital discharge instructions, assessing medication adherence, reviewing problems requiring provider notification, and discussing the importance of follow up with her surgeon as directed. Reviewed [place] announcements that all [place] members will receive the Healthy Lifestyle Premium rate in 2021.  Using [place] website, verified that patient is an active participate in [place]'s [employer] chronic disease management program.   No ongoing care management needs identified so will close case to [place] Management services. An unsuccessful  outreach letter with [place] Management pamphlet and 24 Hour Nurse Line Magnet was routed to [place] Management clinical pool on 05/05/19 to be mailed to patient's home address.  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  Barrington Ellison RN,CCM,CDE [place] Management Coordinator Office Phone [phone] Office Fax [phone]

## 2019-05-12 MED FILL — OXYCODON-ACETAMINOPHEN 7.5-: 7.5-325 | 8 days supply | Qty: 50 | Fill #0

## 2019-05-13 ENCOUNTER — Telehealth: Payer: Self-pay

## 2019-05-13 NOTE — Telephone Encounter (Signed)
PA was denied back on 04/28/2019- [place] is aware.

## 2019-05-13 NOTE — Telephone Encounter (Signed)
Copied from [place] [id number]. Topic: General - Other >> May 13, 2019  2:56 PM Rayann Heman wrote: Reason for CRM: Alejandia calling from cover my meds sent [place] a fax and would like to know if we received it.  Ref Key [id number]

## 2019-05-13 NOTE — Telephone Encounter (Signed)
Looks like a key for a PA kaylyn did you get anything?

## 2019-05-18 MED FILL — OXYCODON-ACETAMINOPHEN 7.5-: 7.5-325 | 8 days supply | Qty: 50 | Fill #0

## 2019-05-19 ENCOUNTER — Encounter: Payer: Self-pay | Admitting: Family Medicine

## 2019-05-24 DIAGNOSIS — M5136 Other intervertebral disc degeneration, lumbar region: Secondary | ICD-10-CM | POA: Diagnosis not present

## 2019-05-24 DIAGNOSIS — M48062 Spinal stenosis, lumbar region with neurogenic claudication: Secondary | ICD-10-CM | POA: Diagnosis not present

## 2019-05-24 DIAGNOSIS — Z6828 Body mass index (BMI) 28.0-28.9, adult: Secondary | ICD-10-CM | POA: Diagnosis not present

## 2019-05-24 MED FILL — NITROFURANTOIN MACROCRYSTAL: 50 | 30 days supply | Qty: 30 | Fill #7

## 2019-05-24 MED FILL — CEPHALEXIN 500 MG CAPSULE: 500 | 5 days supply | Qty: 20 | Fill #0

## 2019-05-30 ENCOUNTER — Encounter (INDEPENDENT_AMBULATORY_CARE_PROVIDER_SITE_OTHER): Payer: 59 | Admitting: Ophthalmology

## 2019-05-30 MED FILL — TOPIRAMATE ER 100 MG CAP: 100 | 30 days supply | Qty: 30 | Fill #5

## 2019-06-08 MED FILL — UBRELVY 50 MG TABS: 50 | 30 days supply | Qty: 10 | Fill #4

## 2019-06-20 MED FILL — METOPROLOL SUCCINATE ER 50: 50 | 90 days supply | Qty: 90 | Fill #2

## 2019-06-21 ENCOUNTER — Encounter: Payer: Self-pay | Admitting: Family Medicine

## 2019-06-21 DIAGNOSIS — N390 Urinary tract infection, site not specified: Secondary | ICD-10-CM

## 2019-06-22 NOTE — Telephone Encounter (Signed)
Not on patients current medication list. Please advise if refill is appropriate. Thank you!

## 2019-06-23 MED ORDER — NITROFURANTOIN MACROCRYSTAL 50 MG PO CAPS: 50.0000 mg | ORAL_CAPSULE | Freq: Every day | ORAL | 3 refills | Status: DC

## 2019-06-23 MED ORDER — FLUCONAZOLE 150 MG PO TABS: 150.0000 mg | ORAL_TABLET | Freq: Once | ORAL | 1 refills | Status: AC

## 2019-06-23 NOTE — Addendum Note (Signed)
Addended by: Lamar Blinks C on: 06/23/2019 05:05 PM   Modules accepted: Orders

## 2019-06-24 MED FILL — FLUCONAZOLE 150 MG TABS: 150 | 28 days supply | Qty: 4 | Fill #0

## 2019-06-24 MED FILL — NITROFURANTOIN MACROCRYSTAL: 50 | 90 days supply | Qty: 90 | Fill #0

## 2019-06-28 MED FILL — FARXIGA 10 MG TABLET: 10 | 90 days supply | Qty: 90 | Fill #2

## 2019-06-29 MED FILL — TOPIRAMATE ER 100 MG CAP: 100 | 30 days supply | Qty: 30 | Fill #6

## 2019-07-04 MED FILL — JANUVIA 100 MG TABLET: 100 | 90 days supply | Qty: 90 | Fill #2

## 2019-07-15 MED FILL — LOSARTAN POTASSIUM 50 MG TA: 50 | 90 days supply | Qty: 90 | Fill #1

## 2019-07-16 MED FILL — FLUCONAZOLE 150 MG TABS: 150 | 28 days supply | Qty: 4 | Fill #1

## 2019-07-19 DIAGNOSIS — M4316 Spondylolisthesis, lumbar region: Secondary | ICD-10-CM | POA: Diagnosis not present

## 2019-07-19 DIAGNOSIS — I10 Essential (primary) hypertension: Secondary | ICD-10-CM | POA: Diagnosis not present

## 2019-07-19 DIAGNOSIS — M48062 Spinal stenosis, lumbar region with neurogenic claudication: Secondary | ICD-10-CM | POA: Diagnosis not present

## 2019-07-24 ENCOUNTER — Encounter: Payer: Self-pay | Admitting: Family Medicine

## 2019-07-24 DIAGNOSIS — M6530 Trigger finger, unspecified finger: Secondary | ICD-10-CM

## 2019-07-25 ENCOUNTER — Other Ambulatory Visit: Payer: Self-pay | Admitting: Family Medicine

## 2019-07-25 MED FILL — LEVOTHYROXINE 125 MCG TABLE: 125 | 90 days supply | Qty: 90 | Fill #0

## 2019-07-27 ENCOUNTER — Other Ambulatory Visit: Payer: Self-pay

## 2019-07-27 ENCOUNTER — Ambulatory Visit (INDEPENDENT_AMBULATORY_CARE_PROVIDER_SITE_OTHER): Payer: 59 | Admitting: Sports Medicine

## 2019-07-27 DIAGNOSIS — M65332 Trigger finger, left middle finger: Secondary | ICD-10-CM | POA: Diagnosis not present

## 2019-07-27 NOTE — Progress Notes (Signed)
Subjective:    CC: Left hand pain  HPI:  Debbie Hayes is a very pleasant 68 year old female, she has a known left third trigger finger, now with worsening of symptoms, triggering, pain, localized without radiation.  She does have an appointment with Dr. Amedeo Plenty coming up in February.  She tells me she cannot wait, her pain is severe and she desires aggressive interventional treatment today.  I reviewed the past medical history, family history, social history, surgical history, and allergies today and no changes were needed.  Please see the problem list section below in epic for further details.  Past Medical History: Past Medical History:  Diagnosis Date  . Allergy   . Cancer [facility])    thyroid cancer  . Cataract   . Chronic migraine BOTOX INJECTION EVERY 3 MONTHS  . Chronic pain in right shoulder   . Constipation   . Diabetes mellitus   . Disorder of inner ear CAUSES VERTIGO OCCASIONALLY  . Fibromyalgia   . GERD (gastroesophageal reflux disease)   . Hemorrhoid   . History of bladder infections   . History of thyroid cancer 2009  S/P TOTAL THYROIDECTOMY AND RADIATION  . Hyperlipidemia   . Hypertension CARDIOLOGIST- DR Wynonia Lawman- WILL REQUEST LATE NOTE   DENIES S & S  . Hypothyroidism   . Insomnia   . Left shoulder pain   . Macular degeneration   . MRSA infection    nasal treated more than 15 years ago  . Normal nuclear stress test 01-25-2009  . OA (osteoarthritis) JOINTS  . Osteopenia   . Painful orthopaedic hardware ([place])    left foot  . PONV (postoperative nausea and vomiting) SEVERE  . Rotator cuff disorder LEFT SHOULDER RTC IMPINGMENT  . Sleep apnea    mouth guard, no cpap   . Sleep apnea in adult 06/26/2017  . Spondylolisthesis of lumbar region   . 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 Wears glasses    Past Surgical History: Past Surgical History:  Procedure Laterality Date  . BACK SURGERY     Dr Arnoldo Morale- spondylosis  . BILATERAL CARPAL TUNNEL RELEASE  1994  .  BILATERAL ELBOW SURG.  1999  . BILATERAL SALPINGOOPHECTOMY  1993   POST-OP URETER REPAIR 12 DAYS AFTER   . CATARACT EXTRACTION, BILATERAL    . COLONOSCOPY    . EYE SURGERY    . FOOT ARTHRODESIS Left 07/10/2016   Procedure: LEFT 2ND TARSAL METATARSAL ARTHRODESIS  GASTROC RECESSION LEFT LAPIDUS MODIFIED MCBRIDE BUIONECTOMY;  Surgeon: Wylene Simmer, MD;  Location: [place];  Service: Orthopedics;  Laterality: Left;  Marland Kitchen GASTROC RECESSION EXTREMITY Left 07/10/2016   Procedure: GASTROC RECESSION EXTREMITY;  Surgeon: Wylene Simmer, MD;  Location: [place];  Service: Orthopedics;  Laterality: Left;  . HARDWARE REMOVAL Left 05/20/2018   Procedure: Left foot removal of deep implants;  Surgeon: Wylene Simmer, MD;  Location: [place];  Service: Orthopedics;  Laterality: Left;  61min  . LEFT THUMB JOINT REPLACEMENT  2005   RIGHT DONE IN 2004  . OOPHORECTOMY    . POLYPECTOMY    . RIGHT KNEE ARTHROSCOPY  X2  BEFORE 2011  . RIGHT KNEE ARTHROSCOPY/ PARTIAL LATERAL MENISECTOMY/ TRICOMPARTMENT CHONDROPLASTY/ DECOMPRESSION CYST  10-26-2009  . RIGHT KNEE CLOSED MANIPULATION  09-11-2010  . RIGHT SHOULDER ARTHROSCOPY  2010  &  2004  . TIBIA CYST REMOVED AND ORIF LEG FX  1958  . TONSILLECTOMY  1968  . TOTAL KNEE ARTHROPLASTY  07-16-2010  RIGHT  . TOTAL THYROIDECTOMY  2009   CANCER  (POST-OP BLEED)  AND RADIATION [place]  . trigger fingers Right 2013   3rd and 4th fingers  . UPPER GASTROINTESTINAL ENDOSCOPY    . VAGINAL HYSTERECTOMY  1990   Social History: Social History   Socioeconomic History  . Marital status: Married    Spouse name: Not on file  . Number of children: Not on file  . Years of education: Not on file  . Highest education level: Not on file  Occupational History    Comment: retired Therapist, sports  Tobacco Use  . Smoking status: Never Smoker  . Smokeless tobacco: Never Used  Substance and Sexual Activity  . Alcohol use: Yes    Comment: RARE  . Drug  use: No  . Sexual activity: Not on file  Other Topics Concern  . Not on file  Social History Narrative   Lives with husband Dr Ruben Reason.   They have 2 adult children that live out of state.   Social Determinants of Health   Financial Resource Strain:   . Difficulty of Paying Living Expenses: Not on file  Food Insecurity:   . Worried About Charity fundraiser in the Last Year: Not on file  . Ran Out of Food in the Last Year: Not on file  Transportation Needs:   . Lack of Transportation (Medical): Not on file  . Lack of Transportation (Non-Medical): Not on file  Physical Activity:   . Days of Exercise per Week: Not on file  . Minutes of Exercise per Session: Not on file  Stress:   . Feeling of Stress : Not on file  Social Connections:   . Frequency of Communication with Friends and Family: Not on file  . Frequency of Social Gatherings with Friends and Family: Not on file  . Attends Religious Services: Not on file  . Active Member of Clubs or Organizations: Not on file  . Attends Archivist Meetings: Not on file  . Marital Status: Not on file   Family History: Family History  Problem Relation Age of Onset  . Colon polyps Sister 71  . Alzheimer's disease Mother   . Heart disease Father   . Cirrhosis Father   . Heart attack Father   . Macular degeneration Father   . Colon cancer Neg Hx    Allergies: Allergies  Allergen Reactions  . Statins Other (See Comments)    Pt has tried multiple and cannot tolerate  . Sulfa Antibiotics Hives   Medications: See med rec.  Review of Systems: No headache, visual changes, nausea, vomiting, diarrhea, constipation, dizziness, abdominal pain, skin rash, fevers, chills, night sweats, swollen lymph nodes, weight loss, chest pain, body aches, joint swelling, muscle aches, shortness of breath, mood changes, visual or auditory hallucinations.  Objective:    General: Well Developed, well nourished, and in no acute distress.    Neuro: Alert and oriented x3, extra-ocular muscles intact, sensation grossly intact.  HEENT: Normocephalic, atraumatic, pupils equal round reactive to light, neck supple, no masses, no lymphadenopathy, thyroid nonpalpable.  Skin: Warm and dry, no rashes noted.  Cardiac: Regular rate and rhythm, no murmurs rubs or gallops.  Respiratory: Clear to auscultation bilaterally. Not using accessory muscles, speaking in full sentences.  Abdominal: Soft, nontender, nondistended, positive bowel sounds, no masses, no organomegaly.  Left hand: Palpable flexor tendon nodule, third digit.  Visible, palpable triggering, exquisite tenderness to palpation.  No swelling.  Procedure: Real-time Ultrasound Guided injection  of the left third flexor tendon sheath Device: Samsung HS60  Verbal informed consent obtained.  Time-out conducted.  Noted no overlying erythema, induration, or other signs of local infection.  Skin prepped in a sterile fashion.  Local anesthesia: Topical Ethyl chloride.  With sterile technique and under real time ultrasound guidance:  25-gauge needle advanced between the flexor digitorum profundus and superficialis, I then injected 1/2 cc Kenalog 40, 1/2 cc lidocaine.  Completed without difficulty  Pain immediately resolved suggesting accurate placement of the medication.  Advised to call if fevers/chills, erythema, induration, drainage, or persistent bleeding.  Images permanently stored and available for review in the ultrasound unit.  Impression: Technically successful ultrasound guided injection.  Impression and Recommendations:    The patient was counselled, risk factors were discussed, anticipatory guidance given.  Trigger finger, left middle finger Injection as above.  Return in a month.   ___________________________________________ Gwen Her. Dianah Field, M.D., ABFM., CAQSM. Primary Care and Sports Medicine [facility]  Adjunct Professor of [employer]

## 2019-07-27 NOTE — Assessment & Plan Note (Signed)
Injection as above.  Return in a month.

## 2019-07-30 MED FILL — TOPIRAMATE ER 100 MG CAP: 100 | 30 days supply | Qty: 30 | Fill #7

## 2019-08-02 ENCOUNTER — Other Ambulatory Visit: Payer: Self-pay | Admitting: Family Medicine

## 2019-08-02 ENCOUNTER — Encounter: Payer: Self-pay | Admitting: Family Medicine

## 2019-08-02 MED FILL — UBRELVY 50 MG TABS: 50 | 30 days supply | Qty: 10 | Fill #5

## 2019-08-02 MED FILL — ZOLPIDEM TARTRATE 5 MG TAB: 5 | 60 days supply | Qty: 30 | Fill #0

## 2019-08-02 MED FILL — LEVOTHYROXINE 125 MCG TABLE: 125 | 90 days supply | Qty: 90 | Fill #0

## 2019-08-02 NOTE — Telephone Encounter (Signed)
Requesting:Zolpidem Contract:none [id number] Last [id number] Next Visit:none scheduled Last Refill:05/02/2019  Please Advise

## 2019-08-08 MED FILL — traZODone HCL 100 MG TABS: 100 | 90 days supply | Qty: 225 | Fill #2

## 2019-08-25 ENCOUNTER — Ambulatory Visit: Payer: 59

## 2019-08-26 ENCOUNTER — Ambulatory Visit: Payer: 59 | Admitting: Sports Medicine

## 2019-08-30 MED FILL — TOPIRAMATE ER 100 MG CAP: 100 | 30 days supply | Qty: 30 | Fill #8

## 2019-09-03 ENCOUNTER — Ambulatory Visit: Payer: 59 | Attending: Internal Medicine

## 2019-09-03 DIAGNOSIS — Z23 Encounter for immunization: Secondary | ICD-10-CM | POA: Insufficient documentation

## 2019-09-03 NOTE — Progress Notes (Signed)
   Covid-19 Vaccination Clinic  Name:  DEJANAY BURROWS    MRN: [id number] DOB: 1951/01/03  09/03/2019  Ms. Laliberte was observed post Covid-19 immunization for 15 minutes without incidence. She was provided with Vaccine Information Sheet and instruction to access the V-Safe system.   Ms. Molinaro was instructed to call 911 with any severe reactions post vaccine: Marland Kitchen Difficulty breathing  . Swelling of your face and throat  . A fast heartbeat  . A bad rash all over your body  . Dizziness and weakness    Immunizations Administered    Name Date Dose VIS Date Route   Pfizer COVID-19 Vaccine 09/03/2019  1:24 PM 0.3 mL 07/08/2019 Intramuscular   Manufacturer: [place]   Lot: [id number]   [place]: [id number]

## 2019-09-05 ENCOUNTER — Ambulatory Visit: Payer: 59

## 2019-09-07 DIAGNOSIS — M79641 Pain in right hand: Secondary | ICD-10-CM | POA: Diagnosis not present

## 2019-09-07 DIAGNOSIS — M65332 Trigger finger, left middle finger: Secondary | ICD-10-CM | POA: Diagnosis not present

## 2019-09-07 DIAGNOSIS — M79642 Pain in left hand: Secondary | ICD-10-CM | POA: Diagnosis not present

## 2019-09-10 MED FILL — NITROFURANTOIN MACROCRYSTAL: 50 | 90 days supply | Qty: 90 | Fill #0

## 2019-09-19 MED FILL — UBRELVY 50 MG TABS: 50 | 30 days supply | Qty: 10 | Fill #6

## 2019-09-21 ENCOUNTER — Other Ambulatory Visit: Payer: Self-pay | Admitting: Family Medicine

## 2019-09-21 ENCOUNTER — Encounter: Payer: Self-pay | Admitting: Family Medicine

## 2019-09-21 DIAGNOSIS — I10 Essential (primary) hypertension: Secondary | ICD-10-CM

## 2019-09-21 MED FILL — METOPROLOL SUCCINATE ER 50: 50 | 90 days supply | Qty: 90 | Fill #0

## 2019-09-23 MED FILL — TOPIRAMATE ER 100 MG CAP: 100 | 30 days supply | Qty: 30 | Fill #0

## 2019-09-25 MED FILL — FARXIGA 10 MG TABLET: 10 | 90 days supply | Qty: 90 | Fill #3

## 2019-09-28 ENCOUNTER — Ambulatory Visit: Payer: 59 | Attending: Internal Medicine

## 2019-09-28 DIAGNOSIS — Z23 Encounter for immunization: Secondary | ICD-10-CM | POA: Insufficient documentation

## 2019-09-28 NOTE — Progress Notes (Signed)
   Covid-19 Vaccination Clinic  Name:  Debbie Hayes    MRN: [id number] DOB: [birth year]  09/28/2019  Debbie Hayes was observed post Covid-19 immunization for 15 minutes without incident. She was provided with Vaccine Information Sheet and instruction to access the V-Safe system.   Debbie Hayes was instructed to call 911 with any severe reactions post vaccine: Marland Kitchen Difficulty breathing  . Swelling of face and throat  . A fast heartbeat  . A bad rash all over body  . Dizziness and weakness   Immunizations Administered    Name Date Dose VIS Date Route   Pfizer COVID-19 Vaccine 09/28/2019 10:29 AM 0.3 mL 07/08/2019 Intramuscular   Manufacturer: [place]   Lot: [id number]   [place]: [id number]

## 2019-09-29 MED FILL — ZOLPIDEM TARTRATE 5 MG TAB: 5 | 60 days supply | Qty: 30 | Fill #1

## 2019-10-02 MED FILL — JANUVIA 100 MG TABLET: 100 | 90 days supply | Qty: 90 | Fill #3

## 2019-10-03 MED FILL — JANUVIA 100 MG TABLET: 100 | 60 days supply | Qty: 60 | Fill #3

## 2019-10-10 ENCOUNTER — Encounter: Payer: Self-pay | Admitting: Family Medicine

## 2019-10-13 ENCOUNTER — Other Ambulatory Visit: Payer: Self-pay | Admitting: Family Medicine

## 2019-10-13 MED FILL — LOSARTAN POTASSIUM 50 MG TA: 50 | 90 days supply | Qty: 90 | Fill #0

## 2019-10-15 ENCOUNTER — Other Ambulatory Visit: Payer: Self-pay | Admitting: Family Medicine

## 2019-10-18 ENCOUNTER — Other Ambulatory Visit: Payer: Self-pay

## 2019-10-18 MED FILL — TOPIRAMATE ER 100 MG CAP: 100 | 30 days supply | Qty: 30 | Fill #1

## 2019-10-18 NOTE — Progress Notes (Addendum)
[facility] [address] [place], [place] [address] [phone]  Date:  10/19/2019   Name:  Debbie Hayes   DOB:  [birth year]   MRN:  [id number]  PCP:  Darreld Mclean, MD    Chief Complaint: Back Pain (thoracic vert. pain, started in december, )   History of Present Illness:  Debbie Hayes is a 69 y.o. 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  12 adult children-1 daughter has Down syndrome, lives independently She has 6 grandchildren, very sadly her seventh grandchild was stillborn around the holidays  Last seen by myself in September She sees Dr. Jaynee Eagles for migraine headache She had a posterior lumbar laminectomy with fusion and removal of hardware in October per Dr. Arnoldo Morale She is now having some more thoracic back pain since November It is significant pain but not enough for her to use narcotics   Eye exam- she is UTD Foot exam due today A1c due- update today Can update DEXA scan- this will be done in May  Her most recent A1c was elevated, we thought perhaps related to decreased activity due to back pain She has received both doses of Covid vaccine Shingrix  She has noted hoarseness over the last 6 months- she has an ENT who she will see [place] in [place] Will seem to come and go No pain or difficulty swallowing She did have thyroid cancer 11 years ago- treated with radioactive iodine only   Lab Results  Component Value Date   HGBA1C 7.4 (H) 04/18/2019   Farxiga 10 Januvia 100 Nexium Synthroid Losartan Toprol-XL Qudexy XR Trazodone Ambien as needed Ubrelvy as needed migraine   09/29/2019  1   08/02/2019  Zolpidem Tartrate 5 MG Tablet  30.00  60 Je Cop   [id number]   Wes (0126)   1  0.12  LME  Comm Ins   [place]  08/02/2019  1   08/02/2019  Zolpidem Tartrate 5 MG Tablet  30.00  60 Je Cop   [id number]   Wes (0126)   0  0.12 LME  Comm Ins   [place]  05/18/2019  1   05/18/2019  Oxycodone-Acetaminophn 7.5-325  50.00  8 Me Ber   [id number]   Wes (0126)   0  70.31 MME  Comm Ins   [place]  05/12/2019  1   05/12/2019  Oxycodone-Acetaminophn 7.5-325  50.00  8 Je Jen   [id number]   Wes (0126)   0  70.31 MME  Comm Ins   [place]  05/03/2019  1   05/03/2019  Oxycodone Hcl 10 MG Tablet  30.00  5 Je Jen   [id number]   Wes (0126)   0  90.00 MME  Comm Ins   [place]  03/03/2019  1   02/23/2019  Oxaydo 7.5 MG Tablet  30.00  10 Je Cop   [id number]   Wes (0126)   0  33.75 MME  Comm Ins   [place]  02/24/2019  1   02/23/2019  Oxaydo 7.5 MG Tablet  30.00  10 Je Cop   [id number]   Wes (0126)   0  33.75 MME  Comm Ins   [place]  11/19/2018  1   11/19/2018  Zolpidem Tartrate 5 MG Tablet  30.00  30 Je Cop   [id number]   Wes (0126)   0  0.25 LME  Comm Ins   [place]  09/03/2018  1   09/02/2018  Oxycodone-Acetaminophn 7.5-325  30.00  10 Je Cop   [id number]   Wes (0126)   0  33.75 MME  Comm Ins   [place]  08/16/2018  1   02/18/2018  Zolpidem Tartrate 5 MG Tablet  30.00  60 Je Cop   [id number]   Wes (0126)   1  0.12 LME        Patient Active Problem List   Diagnosis Date Noted  . Trigger finger, left middle finger 07/27/2019  . Chronic neck pain 11/10/2018  . Orthopedic hardware present 02/17/2018  . Diabetes mellitus due to underlying condition with complication ([place]) 84/13/2440  . 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 Arthralgia 12/15/2017  . Joint pain 12/15/2017  . Migraine, transformed 12/15/2017  . 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 Pain of left shoulder joint on movement 10/19/2017  . OSA (obstructive sleep apnea) 06/26/2017  . Snoring 03/25/2016  . Palpitations 03/25/2016  . Headache 03/25/2016  . Sensory disorder of trigeminal nerve 11/26/2015  . Spondylolisthesis of lumbar region 08/09/2014  . Lumbar adjacent  segment disease with spondylolisthesis 08/09/2014  . Back pain 02/17/2014  . Macular degeneration 02/17/2014  . Backache 02/17/2014  . Other and unspecified hyperlipidemia 10/14/2013  . Hyperlipidemia 10/14/2013  . Onychomycosis 12/01/2012  . Arthritis 04/12/2012  . Hypothyroid 04/12/2012  . Hypothyroidism 04/12/2012    Past Medical History:  Diagnosis Date  . Allergy   . Cancer [facility])    thyroid cancer  . Cataract   . Chronic migraine BOTOX INJECTION EVERY 3 MONTHS  . Chronic pain in right shoulder   . Constipation   . Diabetes mellitus   . Disorder of inner ear CAUSES VERTIGO OCCASIONALLY  . Fibromyalgia   . GERD (gastroesophageal reflux disease)   . Hemorrhoid   . History of bladder infections   . History of thyroid cancer 2009  S/P TOTAL THYROIDECTOMY AND RADIATION  . Hyperlipidemia   . Hypertension CARDIOLOGIST- DR Wynonia Lawman- WILL REQUEST LATE NOTE   DENIES S & S  . Hypothyroidism   . Insomnia   . Left shoulder pain   . Macular degeneration   . MRSA infection    nasal treated more than 15 years ago  . Normal nuclear stress test 01-25-2009  . OA (osteoarthritis) JOINTS  . Osteopenia   . Painful orthopaedic hardware ([place])    left foot  . PONV (postoperative nausea and vomiting) SEVERE  . Rotator cuff disorder LEFT SHOULDER RTC IMPINGMENT  . Sleep apnea    mouth guard, no cpap   . Sleep apnea in adult 06/26/2017  . Spondylolisthesis of lumbar region   . 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 Wears glasses     Past Surgical History:  Procedure Laterality Date  . BACK SURGERY     Dr Arnoldo Morale- spondylosis  . BILATERAL CARPAL TUNNEL RELEASE  1994  . BILATERAL ELBOW SURG.  1999  . BILATERAL SALPINGOOPHECTOMY  1993   POST-OP URETER REPAIR 12 DAYS AFTER   . CATARACT EXTRACTION, BILATERAL    . COLONOSCOPY    . EYE SURGERY    . FOOT ARTHRODESIS Left 07/10/2016   Procedure: LEFT 2ND TARSAL METATARSAL ARTHRODESIS  GASTROC RECESSION LEFT LAPIDUS MODIFIED MCBRIDE  BUIONECTOMY;  Surgeon: Wylene Simmer, MD;  Location: [facility];  Service: Orthopedics;  Laterality: Left;  Marland Kitchen GASTROC RECESSION EXTREMITY Left 07/10/2016   Procedure: GASTROC RECESSION EXTREMITY;  Surgeon: Wylene Simmer, MD;  Location: [place];  Service: Orthopedics;  Laterality: Left;  . HARDWARE REMOVAL Left 05/20/2018   Procedure: Left foot removal of deep implants;  Surgeon: Wylene Simmer, MD;  Location: [employer];  Service: Orthopedics;  Laterality: Left;  82mn  . LEFT THUMB JOINT REPLACEMENT  2005   RIGHT DONE IN 2004  . OOPHORECTOMY    . POLYPECTOMY    . RIGHT KNEE ARTHROSCOPY  X2  BEFORE 2011  . RIGHT KNEE ARTHROSCOPY/ PARTIAL LATERAL MENISECTOMY/ TRICOMPARTMENT CHONDROPLASTY/ DECOMPRESSION CYST  10-26-2009  . RIGHT KNEE CLOSED MANIPULATION  09-11-2010  . RIGHT SHOULDER ARTHROSCOPY  2010  &  2004  . TIBIA CYST REMOVED AND ORIF LEG FX  1958  . TONSILLECTOMY  1968  . TOTAL KNEE ARTHROPLASTY  07-16-2010   RIGHT  . TOTAL THYROIDECTOMY  2009   CANCER  (POST-OP BLEED)  AND RADIATION TX  . trigger fingers Right 2013   3rd and 4th fingers  . UPPER GASTROINTESTINAL ENDOSCOPY    . VAGINAL HYSTERECTOMY  1990    Social History   Tobacco Use  . Smoking status: Never Smoker  . Smokeless tobacco: Never Used  Substance Use Topics  . Alcohol use: Yes    Comment: RARE  . Drug use: No    Family History  Problem Relation Age of Onset  . Colon polyps Sister 533 . Alzheimer's disease Mother   . Heart disease Father   . Cirrhosis Father   . Heart attack Father   . Macular degeneration Father   . Colon cancer Neg Hx     Allergies  Allergen Reactions  . Statins Other (See Comments)    Pt has tried multiple and cannot tolerate  . Sulfa Antibiotics Hives    Medication list has been reviewed and updated.  Current Outpatient Medications on File Prior to Visit  Medication Sig Dispense Refill  . blood glucose meter kit and supplies KIT  Test blood sugar once daily. Dx code: E11.9 1 each 0  . Blood Glucose Monitoring Suppl (FREESTYLE LITE) DEVI Check blood sugar no more than twice daily 1 each 0  . dapagliflozin propanediol (FARXIGA) 10 MG TABS tablet Take 10 mg by mouth daily. 90 tablet 3  . esomeprazole (NEXIUM) 40 MG capsule Take 40 mg by mouth daily as needed (acid reflux).     .Marland Kitchenglucose blood (FREESTYLE LITE) test strip CHECK BLOOD SUGARS NO MORE THAN TWICE DAILY 100 each 12  . JANUVIA 100 MG tablet TAKE 1 TABLET BY MOUTH DAILY. (Patient taking differently: Take 100 mg by mouth daily. ) 90 tablet 3  . Lancets (FREESTYLE) lancets Check blood sugars no more than twice daily 100 each 12  . levothyroxine (SYNTHROID) 125 MCG tablet TAKE 1 TABLET (125 MCG TOTAL) BY MOUTH DAILY BEFORE BREAKFAST. 90 tablet 1  . losartan (COZAAR) 50 MG tablet TAKE 1 TABLET (50 MG TOTAL) BY MOUTH DAILY. 90 tablet 1  . metoprolol succinate (TOPROL-XL) 50 MG 24 hr tablet TAKE 1 TABLET (50 MG TOTAL) BY MOUTH DAILY. TAKE WITH OR IMMEDIATELY FOLLOWING A MEAL. 90 tablet 3  . Multiple Vitamins-Minerals (ICAPS AREDS 2) CAPS Take 1 capsule by mouth 2 (two) times daily.     .Marland KitchenNASONEX 50 MCG/ACT nasal spray PLACE 2 SPRAYS INTO THE NOSE DAILY. (Patient taking differently: Place 2 sprays into  the nose daily. ) 17 g 9  . nitrofurantoin (MACRODANTIN) 50 MG capsule Take 1 capsule (50 mg total) by mouth at bedtime. 90 capsule 3  . ondansetron (ZOFRAN) 4 MG tablet TAKE 1 TABLET BY MOUTH EVERY 8 HOURS AS NEEDED FOR NAUSEA AND VOMITING 30 tablet 1  . Pseudoephedrine-DM-GG (ROBITUSSIN CF PO) Take 2 capsules by mouth daily.    Marland Kitchen topiramate ER (QUDEXY XR) 100 MG CS24 sprinkle capsule Take 1 capsule (100 mg total) by mouth at bedtime. 90 capsule 4  . traZODone (DESYREL) 100 MG tablet TAKE 2.5 TABLETS BY MOUTH AT BEDTIME. (Patient taking differently: Take 250 mg by mouth at bedtime. ) 225 tablet 3  . Ubrogepant (UBRELVY) 50 MG TABS Take 50 mg by mouth every 2 (two) hours as  needed. Max 279m a day (Patient taking differently: Take 50 mg by mouth every 2 (two) hours as needed (migraines). Max 2069ma day) 10 tablet 6  . zolpidem (AMBIEN) 5 MG tablet TAKE 1/2 TABLET BY MOUTH AT BEDTIME AS NEEDED FOR SLEEP 30 tablet 1   No current facility-administered medications on file prior to visit.    Review of Systems:  As per HPI- otherwise negative.   Physical Examination: Vitals:   10/19/19 1058  BP: 116/76  Pulse: 75  Resp: 16  Temp: (!) 97.2 F (36.2 C)  SpO2: 97%   Vitals:   10/19/19 1058  Weight: 184 lb (83.5 kg)  Height: _0  (1.727 m)   Body mass index is 27.98 kg/m. Ideal Body Weight: Weight in (lb) to have BMI = 25: 164.1  GEN: no acute distress.  Mild overweight, looks well HEENT: Atraumatic, Normocephalic.  Ears and Nose: No external deformity. CV: RRR, No M/G/R. No JVD. No thrill. No extra heart sounds. PULM: CTA B, no wheezes, crackles, rhonchi. No retractions. No resp. distress. No accessory muscle use. ABD: S, NT, ND, +BS. No rebound. No HSM. EXTR: No c/c/e PSYCH: Normally interactive. Conversant.  Scaring from previous Lumbar surgery well healed She notes tenderness over her lower thoracic spine, no redness or wound.  No muscular tenderness noted  Foot exam normal today   Assessment and Plan: Acute blood loss anemia - Plan: CBC  Diabetes mellitus without complication (HCC) - Plan: Hemoglobin A1c  Essential hypertension  Postoperative hypothyroidism  Yeast vaginitis - Plan: fluconazole (DIFLUCAN) 150 MG tablet  Non-seasonal allergic rhinitis, unspecified trigger - Plan: montelukast (SINGULAIR) 10 MG tablet  Chronic midline thoracic back pain - Plan: DG Thoracic Spine 2 View, Pain Mgmt, Profile 8 w/Conf, U  Lumbar adjacent segment disease with spondylolisthesis - Plan: oxyCODONE HCl 7.5 MG TABS  Following up today  Recheck CBC A1c Refilled some chronic meds for her- she does take oxycodone on occasion for back and foot  pain.  UDS today, reviewed controlled substance database for her today Obtain plain films of her back today  She has noted some mild hoarseness for about 6 months- I encouraged her to see ENT to make sure all is well, she has an ENT and will schedule to see him.  She does not need a referral Moderate medical decision making This visit occurred during the SARS-CoV-2 public health emergency.  Safety protocols were in place, including screening questions prior to the visit, additional usage of staff PPE, and extensive cleaning of exam room while observing appropriate contact time as indicated for disinfecting solutions.    Signed JeLamar BlinksMD  Received her x-rays, message to pt  DG Thoracic Spine 2  View  Result Date: 10/19/2019 CLINICAL DATA:  Mid to lower thoracic spine pain for 3-4 months. No known injury. EXAM: THORACIC SPINE 2 VIEWS COMPARISON:  04/19/2018 FINDINGS: There is a chronic mild compound thoracic scoliosis, unchanged. No bone destruction or fracture or appreciable significant degenerative disc disease. No change since the prior exam. Aortic atherosclerosis. IMPRESSION: No acute abnormality of the thoracic spine. Chronic compound thoracic scoliosis. Aortic Atherosclerosis (ICD10-I70.0). Electronically Signed   By: Lorriane Shire M.D.   On: 10/19/2019 12:03   Received her labs as below, 3/25 Message to patient  Results for orders placed or performed in visit on 10/19/19  Hemoglobin A1c  Result Value Ref Range   Hgb A1c MFr Bld 7.6 (H) 4.6 - 6.5 %  CBC  Result Value Ref Range   WBC 5.6 4.0 - 10.5 K/uL   RBC 4.90 3.87 - 5.11 Mil/uL   Platelets 238.0 150.0 - 400.0 K/uL   Hemoglobin 11.6 (L) 12.0 - 15.0 g/dL   HCT 36.9 36.0 - 46.0 %   MCV 75.4 (L) 78.0 - 100.0 fl   MCHC 31.5 30.0 - 36.0 g/dL   RDW 17.8 (H) 11.5 - 15.5 %

## 2019-10-18 NOTE — Patient Instructions (Addendum)
Great to see you again today, as always!  I will be in touch with your labs and x-ray reports Assuming labs are ok we can plan to visit in 6 months or so

## 2019-10-19 ENCOUNTER — Encounter: Payer: Self-pay | Admitting: Family Medicine

## 2019-10-19 ENCOUNTER — Ambulatory Visit (HOSPITAL_BASED_OUTPATIENT_CLINIC_OR_DEPARTMENT_OTHER)
Admission: RE | Admit: 2019-10-19 | Discharge: 2019-10-19 | Disposition: A | Discharge status: Home / Self Care | Payer: 59 | Source: Ambulatory Visit | Attending: Family Medicine | Admitting: Family Medicine

## 2019-10-19 ENCOUNTER — Ambulatory Visit: Payer: 59 | Admitting: Family Medicine

## 2019-10-19 VITALS — BP 116/76 | HR 75 | Temp 97.2°F | Resp 16 | Ht 68.0 in | Wt 184.0 lb

## 2019-10-19 DIAGNOSIS — I10 Essential (primary) hypertension: Secondary | ICD-10-CM

## 2019-10-19 DIAGNOSIS — G8929 Other chronic pain: Secondary | ICD-10-CM | POA: Insufficient documentation

## 2019-10-19 DIAGNOSIS — M546 Pain in thoracic spine: Secondary | ICD-10-CM

## 2019-10-19 DIAGNOSIS — D62 Acute posthemorrhagic anemia: Secondary | ICD-10-CM | POA: Diagnosis not present

## 2019-10-19 DIAGNOSIS — E890 Postprocedural hypothyroidism: Secondary | ICD-10-CM | POA: Diagnosis not present

## 2019-10-19 DIAGNOSIS — B3731 Acute candidiasis of vulva and vagina: Secondary | ICD-10-CM

## 2019-10-19 DIAGNOSIS — E119 Type 2 diabetes mellitus without complications: Secondary | ICD-10-CM | POA: Diagnosis not present

## 2019-10-19 DIAGNOSIS — B373 Candidiasis of vulva and vagina: Secondary | ICD-10-CM

## 2019-10-19 DIAGNOSIS — M5136 Other intervertebral disc degeneration, lumbar region: Secondary | ICD-10-CM | POA: Diagnosis not present

## 2019-10-19 DIAGNOSIS — M51369 Other intervertebral disc degeneration, lumbar region without mention of lumbar back pain or lower extremity pain: Secondary | ICD-10-CM

## 2019-10-19 DIAGNOSIS — J3089 Other allergic rhinitis: Secondary | ICD-10-CM | POA: Diagnosis not present

## 2019-10-19 DIAGNOSIS — M4316 Spondylolisthesis, lumbar region: Secondary | ICD-10-CM

## 2019-10-19 LAB — CBC
HCT: 36.9 % (ref 36.0–46.0)
Hemoglobin: 11.6 g/dL — ABNORMAL LOW (ref 12.0–15.0)
MCHC: 31.5 g/dL (ref 30.0–36.0)
MCV: 75.4 fl — ABNORMAL LOW (ref 78.0–100.0)
Platelets: 238 10*3/uL (ref 150.0–400.0)
RBC: 4.9 Mil/uL (ref 3.87–5.11)
RDW: 17.8 % — ABNORMAL HIGH (ref 11.5–15.5)
WBC: 5.6 10*3/uL (ref 4.0–10.5)

## 2019-10-19 LAB — HEMOGLOBIN A1C: Hgb A1c MFr Bld: 7.6 % — ABNORMAL HIGH (ref 4.6–6.5)

## 2019-10-19 MED ORDER — MONTELUKAST SODIUM 10 MG PO TABS: 10.0000 mg | ORAL_TABLET | Freq: Every day | ORAL | 3 refills | Status: DC

## 2019-10-19 MED ORDER — FLUCONAZOLE 150 MG PO TABS: 150.0000 mg | ORAL_TABLET | Freq: Once | ORAL | 2 refills | Status: AC

## 2019-10-19 MED ORDER — OXYCODONE HCL 7.5 MG PO TABS: 1.0000 | ORAL_TABLET | Freq: Three times a day (TID) | ORAL | 0 refills | Status: DC | PRN

## 2019-10-19 MED FILL — FLUCONAZOLE 150 MG TABS: 150 | 30 days supply | Qty: 5 | Fill #0

## 2019-10-19 MED FILL — OXAYDO 7.5 MG TABLET: 7.5 | 14 days supply | Qty: 42 | Fill #0

## 2019-10-19 MED FILL — MONTELUKAST SOD 10 MG TAB: 10 | 90 days supply | Qty: 90 | Fill #0

## 2019-10-20 ENCOUNTER — Encounter: Payer: Self-pay | Admitting: Family Medicine

## 2019-10-20 LAB — PAIN MGMT, PROFILE 8 W/CONF, U
6 Acetylmorphine: NEGATIVE ng/mL
Alcohol Metabolites: NEGATIVE ng/mL (ref <500)
Amphetamines: NEGATIVE ng/mL
Benzodiazepines: NEGATIVE ng/mL
Buprenorphine, Urine: NEGATIVE ng/mL
Cocaine Metabolite: NEGATIVE ng/mL
Creatinine: 75.3 mg/dL
MDMA: NEGATIVE ng/mL
Marijuana Metabolite: NEGATIVE ng/mL
Opiates: NEGATIVE ng/mL
Oxidant: NEGATIVE ug/mL
Oxycodone: NEGATIVE ng/mL
pH: 7.7 (ref 4.5–9.0)

## 2019-10-25 MED FILL — LEVOTHYROXINE 125 MCG TABLE: 125 | 90 days supply | Qty: 90 | Fill #1

## 2019-10-31 ENCOUNTER — Other Ambulatory Visit: Payer: Self-pay | Admitting: Family Medicine

## 2019-10-31 ENCOUNTER — Encounter: Payer: Self-pay | Admitting: Family Medicine

## 2019-10-31 DIAGNOSIS — M4316 Spondylolisthesis, lumbar region: Secondary | ICD-10-CM

## 2019-11-05 MED FILL — traZODone HCL 100 MG TABS: 100 | 90 days supply | Qty: 225 | Fill #3

## 2019-11-07 ENCOUNTER — Encounter: Payer: Self-pay | Admitting: Family Medicine

## 2019-11-07 DIAGNOSIS — R490 Dysphonia: Secondary | ICD-10-CM

## 2019-11-10 ENCOUNTER — Other Ambulatory Visit: Payer: Self-pay | Admitting: Family Medicine

## 2019-11-10 DIAGNOSIS — M4316 Spondylolisthesis, lumbar region: Secondary | ICD-10-CM

## 2019-11-17 MED FILL — TOPIRAMATE ER 100 MG CAP: 100 | 30 days supply | Qty: 30 | Fill #9

## 2019-11-28 ENCOUNTER — Other Ambulatory Visit: Payer: Self-pay | Admitting: Family Medicine

## 2019-11-28 MED FILL — ZOLPIDEM TARTRATE 5 MG TAB: 5 | 60 days supply | Qty: 30 | Fill #0

## 2019-11-28 NOTE — Telephone Encounter (Signed)
Requesting: Ambien Contract: 11/09/17 UDS: 10/19/19 Last OV: 10/19/19 Next OV: N/A Last Refill: 11/10/18, #10--6 RF Database:   Please advise

## 2019-12-01 MED FILL — JANUVIA 100 MG TABLET: 100 | 30 days supply | Qty: 30 | Fill #4

## 2019-12-02 ENCOUNTER — Other Ambulatory Visit: Payer: Self-pay | Admitting: Family Medicine

## 2019-12-02 DIAGNOSIS — G4700 Insomnia, unspecified: Secondary | ICD-10-CM

## 2019-12-14 MED FILL — METOPROLOL SUCCINATE ER 50: 50 | 90 days supply | Qty: 90 | Fill #1

## 2019-12-14 MED FILL — TOPIRAMATE ER 100 MG CAP: 100 | 30 days supply | Qty: 30 | Fill #10

## 2019-12-20 ENCOUNTER — Other Ambulatory Visit: Payer: Self-pay | Admitting: Family Medicine

## 2019-12-20 ENCOUNTER — Other Ambulatory Visit: Payer: Self-pay | Admitting: Neurology

## 2019-12-20 DIAGNOSIS — G43711 Chronic migraine without aura, intractable, with status migrainosus: Secondary | ICD-10-CM

## 2019-12-20 DIAGNOSIS — E119 Type 2 diabetes mellitus without complications: Secondary | ICD-10-CM

## 2019-12-20 MED FILL — FARXIGA 10 MG TABLET: 10 | 90 days supply | Qty: 90 | Fill #0

## 2019-12-20 MED FILL — NITROFURANTOIN MCR 50 MG CA: 50 | 90 days supply | Qty: 90 | Fill #1

## 2019-12-22 DIAGNOSIS — M4316 Spondylolisthesis, lumbar region: Secondary | ICD-10-CM | POA: Diagnosis not present

## 2019-12-22 MED FILL — UBRELVY 50 MG TABS: 50 | 30 days supply | Qty: 10 | Fill #0

## 2019-12-22 MED FILL — METHYLPREDNISOLONE 4 MG TBP: 4 | 6 days supply | Qty: 21 | Fill #0

## 2019-12-23 DIAGNOSIS — E119 Type 2 diabetes mellitus without complications: Secondary | ICD-10-CM | POA: Diagnosis not present

## 2019-12-31 ENCOUNTER — Encounter: Payer: Self-pay | Admitting: Family Medicine

## 2019-12-31 ENCOUNTER — Other Ambulatory Visit: Payer: Self-pay | Admitting: Family Medicine

## 2019-12-31 DIAGNOSIS — E119 Type 2 diabetes mellitus without complications: Secondary | ICD-10-CM

## 2020-01-01 MED ORDER — ALBUTEROL SULFATE HFA 108 (90 BASE) MCG/ACT IN AERS
2.0000 | INHALATION_SPRAY | Freq: Four times a day (QID) | RESPIRATORY_TRACT | 6 refills | Status: DC | PRN
Start: 2020-01-01 — End: 2020-12-28

## 2020-01-05 ENCOUNTER — Other Ambulatory Visit: Payer: Self-pay | Admitting: Family Medicine

## 2020-01-11 ENCOUNTER — Other Ambulatory Visit: Payer: Self-pay | Admitting: *Deleted

## 2020-01-11 MED ORDER — TOPIRAMATE ER 100 MG PO SPRINKLE CAP24: 100.0000 mg | EXTENDED_RELEASE_CAPSULE | Freq: Every day | ORAL | 0 refills | Status: DC

## 2020-01-11 MED FILL — MONTELUKAST SOD 10 MG TAB: 10 | 90 days supply | Qty: 90 | Fill #1

## 2020-01-11 MED FILL — TOPIRAMATE ER 100 MG CAP: 100 | 30 days supply | Qty: 30 | Fill #0

## 2020-01-16 ENCOUNTER — Ambulatory Visit: Payer: 59 | Admitting: Sports Medicine

## 2020-01-24 ENCOUNTER — Other Ambulatory Visit: Payer: Self-pay | Admitting: Family Medicine

## 2020-01-24 ENCOUNTER — Other Ambulatory Visit: Payer: Self-pay

## 2020-01-24 MED ORDER — LEVOTHYROXINE SODIUM 125 MCG PO TABS: 137.0000 ug | ORAL_TABLET | Freq: Every day | ORAL | 3 refills | Status: DC

## 2020-01-24 MED FILL — LEVOTHYROXINE 125 MCG TABLE: 125 | 30 days supply | Qty: 30 | Fill #0

## 2020-01-25 ENCOUNTER — Ambulatory Visit: Payer: 59 | Attending: Physician Assistant | Admitting: Physical Therapy

## 2020-01-25 ENCOUNTER — Other Ambulatory Visit: Payer: Self-pay

## 2020-01-25 ENCOUNTER — Encounter: Payer: Self-pay | Admitting: Physical Therapy

## 2020-01-25 DIAGNOSIS — M545 Low back pain, unspecified: Secondary | ICD-10-CM

## 2020-01-25 DIAGNOSIS — M546 Pain in thoracic spine: Secondary | ICD-10-CM | POA: Diagnosis not present

## 2020-01-25 DIAGNOSIS — M62830 Muscle spasm of back: Secondary | ICD-10-CM | POA: Insufficient documentation

## 2020-01-25 MED FILL — ZOLPIDEM TARTRATE 5 MG TAB: 5 | 60 days supply | Qty: 30 | Fill #1

## 2020-01-25 NOTE — Therapy (Signed)
[facility] [place], [place], [address] Phone: [phone]   Fax:  [phone]  Physical Therapy Evaluation  Patient Details  Name: Debbie Hayes MRN: [id number] Date of Birth: Jan 21, 1951 Referring Provider (PT): Sandre Kitty Date: 01/25/2020   PT End of Session - 01/25/20 1708    Visit Number 1    Date for PT Re-Evaluation 03/26/20    PT Start Time 1440    PT Stop Time 1528    PT Time Calculation (min) 48 min    Activity Tolerance Patient limited by pain    Behavior During Therapy [facility] for tasks assessed/performed           Past Medical History:  Diagnosis Date  . Allergy   . Cancer [facility])    thyroid cancer  . Cataract   . Chronic migraine BOTOX INJECTION EVERY 3 MONTHS  . Chronic pain in right shoulder   . Constipation   . Diabetes mellitus   . Disorder of inner ear CAUSES VERTIGO OCCASIONALLY  . Fibromyalgia   . GERD (gastroesophageal reflux disease)   . Hemorrhoid   . History of bladder infections   . History of thyroid cancer 2009  S/P TOTAL THYROIDECTOMY AND RADIATION  . Hyperlipidemia   . Hypertension CARDIOLOGIST- DR Wynonia Lawman- WILL REQUEST LATE NOTE   DENIES S & S  . Hypothyroidism   . Insomnia   . Left shoulder pain   . Macular degeneration   . MRSA infection    nasal treated more than 15 years ago  . Normal nuclear stress test 01-25-2009  . OA (osteoarthritis) JOINTS  . Osteopenia   . Painful orthopaedic hardware ([place])    left foot  . PONV (postoperative nausea and vomiting) SEVERE  . Rotator cuff disorder LEFT SHOULDER RTC IMPINGMENT  . Sleep apnea    mouth guard, no cpap   . Sleep apnea in adult 06/26/2017  . Spondylolisthesis of lumbar region   . 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 Wears glasses     Past Surgical History:  Procedure Laterality Date  . BACK SURGERY     Dr Arnoldo Morale- spondylosis  . BILATERAL CARPAL TUNNEL RELEASE  1994  . BILATERAL  ELBOW SURG.  1999  . BILATERAL SALPINGOOPHECTOMY  1993   POST-OP URETER REPAIR 12 DAYS AFTER   . CATARACT EXTRACTION, BILATERAL    . COLONOSCOPY    . EYE SURGERY    . FOOT ARTHRODESIS Left 07/10/2016   Procedure: LEFT 2ND TARSAL METATARSAL ARTHRODESIS  GASTROC RECESSION LEFT LAPIDUS MODIFIED MCBRIDE BUIONECTOMY;  Surgeon: Wylene Simmer, MD;  Location: [place];  Service: Orthopedics;  Laterality: Left;  Marland Kitchen GASTROC RECESSION EXTREMITY Left 07/10/2016   Procedure: GASTROC RECESSION EXTREMITY;  Surgeon: Wylene Simmer, MD;  Location: [place];  Service: Orthopedics;  Laterality: Left;  . HARDWARE REMOVAL Left 05/20/2018   Procedure: Left foot removal of deep implants;  Surgeon: Wylene Simmer, MD;  Location: [place];  Service: Orthopedics;  Laterality: Left;  54min  . LEFT THUMB JOINT REPLACEMENT  2005   RIGHT DONE IN 2004  . OOPHORECTOMY    . POLYPECTOMY    . RIGHT KNEE ARTHROSCOPY  X2  BEFORE 2011  . RIGHT KNEE ARTHROSCOPY/ PARTIAL LATERAL MENISECTOMY/ TRICOMPARTMENT CHONDROPLASTY/ DECOMPRESSION CYST  10-26-2009  . RIGHT KNEE CLOSED MANIPULATION  09-11-2010  . RIGHT SHOULDER ARTHROSCOPY  2010  &  2004  . TIBIA CYST REMOVED  AND ORIF LEG FX  1958  . TONSILLECTOMY  1968  . TOTAL KNEE ARTHROPLASTY  07-16-2010   RIGHT  . TOTAL THYROIDECTOMY  2009   CANCER  (POST-OP BLEED)  AND RADIATION TX  . trigger fingers Right 2013   3rd and 4th fingers  . UPPER GASTROINTESTINAL ENDOSCOPY    . VAGINAL HYSTERECTOMY  1990    There were no vitals filed for this visit.    Subjective Assessment - 01/25/20 1448    Subjective Patient had a second lumbar fusion in October.  Had first surgery 5 years ago.  She reports that she had an emergency trip to [place] in October, and reports that she really did well, but had pain start again about 4-5 months later.  The recent x-rays looked good.  She reports that she has been very busy with travel and company at her house,  she reports that recently she really has had significant thoracic pain.    Limitations Walking;House hold activities;Standing;Lifting    How long can you stand comfortably? 5 minutes max    Patient Stated Goals have less pain    Currently in Pain? Yes    Pain Score 4     Pain Location Back    Pain Orientation Mid;Lower    Pain Descriptors / Indicators Aching;Sore;Tightness;Radiating    Pain Type Chronic pain    Pain Radiating Towards denies    Pain Onset More than a month ago    Pain Frequency Constant    Aggravating Factors  pain up to 8/10, bending, standing, lifting,    Pain Relieving Factors Aleve, pain medication, sitting in a specific chair    Effect of Pain on Daily Activities difficulty with all ADL's              [facility] PT Assessment - 01/25/20 0001      Assessment   Medical Diagnosis Thoracic and lumbar pain    Referring Provider (PT) Arnoldo Morale    Onset Date/Surgical Date 12/25/19    Prior Therapy yes about 2 years ago      Precautions   Precaution Comments migraines      Balance Screen   Has the patient fallen in the past 6 months No    Has the patient had a decrease in activity level because of a fear of falling?  No    Is the patient reluctant to leave their home because of a fear of falling?  No      Home Environment   Additional Comments has stairs at home, does travel a lot, does housework      Prior Function   Level of Independence Independent    Vocation Retired    Leisure no exercise      Mining engineer Comments fwd head, rounded shoulders      AROM   Overall AROM Comments Lumbar ROM decrease 75% with tightness      Strength   Overall Strength Comments 4-/5 right , 3+/5 on the left with some pain in the low back      Flexibility   Soft Tissue Assessment /Muscle Length yes    Hamstrings tightness    Piriformis tightness                      Objective measurements completed on examination: See above findings.                PT Education - 01/25/20 1707    Education  Details Wms flexion and pelvic tilts    Person(s) Educated Patient    Methods Explanation;Demonstration;Handout    Comprehension Verbalized understanding            PT Short Term Goals - 01/25/20 1716      PT SHORT TERM GOAL #1   Title independent with initial HEP    Time 2    Period Weeks    Status New             PT Long Term Goals - 01/25/20 1716      PT LONG TERM GOAL #1   Title independent with proper posture and body mechanics    Time 8    Period Weeks    Status New      PT LONG TERM GOAL #2   Title increase lumbar ROM 25%    Time 8    Period Weeks    Status New      PT LONG TERM GOAL #3   Title report able to get up and down from sitting without pain    Time 8    Period Weeks    Status New      PT LONG TERM GOAL #4   Title decrease pain 25%    Time 8    Period Weeks    Status New                  Plan - 01/25/20 1708    Clinical Impression Statement Patient had a lumbar surgery in October, reports a lower lumbsar fusion one level, had the level above fused 5 years ago, she reports that she was doing well, had an emergency trip to [place] and did well, reports that over the past 4-5 months she has had significant increase of thoracic and lumbar pain, she reports that x-rays were negative.  She has limited ROM of the lumbar spine.  She has significant spasms in the lumbar and thoracic area.  She reports that she no longer can walk for exercise, she has difficulty standing and doing any lifting    Stability/Clinical Decision Making Evolving/Moderate complexity    Clinical Decision Making Moderate    Rehab Potential Good    PT Frequency 2x / week    PT Duration 8 weeks    PT Treatment/Interventions ADLs/Self Care Home Management;Electrical Stimulation;Cryotherapy;Moist Heat;Therapeutic exercise;Functional mobility training;Gait training;Ultrasound;Balance training;Patient/family  education;Manual techniques;Dry needling    PT Next Visit Plan slowly start some gentle pelvic and lumbar stabilization    Consulted and Agree with Plan of Care Patient           Patient will benefit from skilled therapeutic intervention in order to improve the following deficits and impairments:  Decreased range of motion, Difficulty walking, Increased muscle spasms, Decreased activity tolerance, Pain, Improper body mechanics, Impaired flexibility, Decreased strength, Postural dysfunction, Decreased mobility  Visit Diagnosis: Pain in thoracic spine - Plan: PT plan of care cert/re-cert  Muscle spasm of back - Plan: PT plan of care cert/re-cert  Acute bilateral low back pain without sciatica - Plan: PT plan of care cert/re-cert     Problem List Patient Active Problem List   Diagnosis Date Noted  . Trigger finger, left middle finger 07/27/2019  . Chronic neck pain 11/10/2018  . Orthopedic hardware present 02/17/2018  . Diabetes mellitus due to underlying condition with complication ([place]) [id number]  . 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 Arthralgia 12/15/2017  . Joint pain 12/15/2017  .  Migraine, transformed 12/15/2017  . 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 Pain of left shoulder joint on movement 10/19/2017  . OSA (obstructive sleep apnea) 06/26/2017  . Snoring 03/25/2016  . Palpitations 03/25/2016  . Headache 03/25/2016  . Sensory disorder of trigeminal nerve 11/26/2015  . Spondylolisthesis of lumbar region 08/09/2014  . Lumbar adjacent segment disease with spondylolisthesis 08/09/2014  . Back pain 02/17/2014  . Macular degeneration 02/17/2014  . Backache 02/17/2014  . Other and unspecified hyperlipidemia 10/14/2013  . Hyperlipidemia 10/14/2013  . Onychomycosis 12/01/2012  . Arthritis 04/12/2012  . Hypothyroid 04/12/2012  . Hypothyroidism 04/12/2012    Sumner Boast., PT 01/25/2020, 5:19 PM  [facility] [address] [place], [place], [address] Phone: [phone]   Fax:  [phone]  Name: Debbie Hayes MRN: [id number] Date of Birth: [birth year]

## 2020-01-27 MED FILL — ALBUTEROL SULFATE HFA 108 (: 108 (90 BAS | 25 days supply | Qty: 18 | Fill #1

## 2020-01-30 MED FILL — JANUVIA 100 MG TABLET: 100 | 30 days supply | Qty: 30 | Fill #1

## 2020-02-07 MED FILL — TOPIRAMATE ER 100 MG CAP: 100 | 90 days supply | Qty: 90 | Fill #0

## 2020-02-08 ENCOUNTER — Ambulatory Visit: Payer: 59 | Admitting: Physical Therapy

## 2020-02-14 ENCOUNTER — Encounter: Payer: Self-pay | Admitting: Medical

## 2020-02-14 ENCOUNTER — Ambulatory Visit: Payer: 59 | Admitting: Medical

## 2020-02-14 ENCOUNTER — Other Ambulatory Visit: Payer: Self-pay

## 2020-02-14 VITALS — BP 110/68 | HR 74 | Resp 18 | Ht 68.0 in | Wt 187.0 lb

## 2020-02-14 DIAGNOSIS — R350 Frequency of micturition: Secondary | ICD-10-CM

## 2020-02-14 DIAGNOSIS — R300 Dysuria: Secondary | ICD-10-CM | POA: Diagnosis not present

## 2020-02-14 LAB — POC URINALSYSI DIPSTICK (AUTOMATED)
Bilirubin, UA: NEGATIVE
Blood, UA: POSITIVE
Glucose, UA: POSITIVE — AB
Ketones, UA: NEGATIVE
Nitrite, UA: POSITIVE
Protein, UA: POSITIVE — AB
Spec Grav, UA: 1.01 (ref 1.010–1.025)
Urobilinogen, UA: 0.2 E.U./dL
pH, UA: 6 (ref 5.0–8.0)

## 2020-02-14 MED ORDER — CEFDINIR 300 MG PO CAPS: 300.0000 mg | ORAL_CAPSULE | Freq: Two times a day (BID) | ORAL | 0 refills | Status: DC

## 2020-02-14 MED FILL — CEFDINIR 300 MG CAPSULE: 300 | 7 days supply | Qty: 14 | Fill #0

## 2020-02-14 MED FILL — UBRELVY 50 MG TABS: 50 | 30 days supply | Qty: 10 | Fill #0

## 2020-02-14 NOTE — Patient Instructions (Addendum)
You do appear to have a urinary tract infection. I am prescribing cefdnir antibiotic for the probable infection. Hydrate well. I am sending out a urine culture. During the interim if your signs and symptoms worsen rather than improving please notify us. We will notify your when the culture results are back.  Follow up in 7 days or as needed.

## 2020-02-14 NOTE — Progress Notes (Signed)
Subjective:    Patient ID: Debbie Hayes, female    DOB: Jun 24, 1951, 69 y.o.   MRN: [id number]  HPI  Pt in today reporting urinary symptoms for 4 days. Mild symptoms on Friday. Gradual increasing symptoms.  Dysuria- yes Frequent urination-yes Hesitancy-no Suprapubic pressure-yes Fever-no chills-no Nausea-no Vomiting-no CVA pain-no History of UTI-Yes.(on preventative macrobid) Gross hematuria-no  Pt expresses she want to use omniceph for uti. States worked last 4 uti.   Review of Systems  Constitutional: Negative for chills, fatigue and fever.  Respiratory: Negative for cough, chest tightness, shortness of breath and wheezing.   Cardiovascular: Negative for chest pain and palpitations.  Gastrointestinal: Negative for abdominal pain.  Genitourinary:       See hpi.  Musculoskeletal: Negative for back pain.  Neurological: Negative for dizziness and weakness.    Past Medical History:  Diagnosis Date  . Allergy   . Cancer [facility])    thyroid cancer  . Cataract   . Chronic migraine BOTOX INJECTION EVERY 3 MONTHS  . Chronic pain in right shoulder   . Constipation   . Diabetes mellitus   . Disorder of inner ear CAUSES VERTIGO OCCASIONALLY  . Fibromyalgia   . GERD (gastroesophageal reflux disease)   . Hemorrhoid   . History of bladder infections   . History of thyroid cancer 2009  S/P TOTAL THYROIDECTOMY AND RADIATION  . Hyperlipidemia   . Hypertension CARDIOLOGIST- DR Wynonia Lawman- WILL REQUEST LATE NOTE   DENIES S & S  . Hypothyroidism   . Insomnia   . Left shoulder pain   . Macular degeneration   . MRSA infection    nasal treated more than 15 years ago  . Normal nuclear stress test 01-25-2009  . OA (osteoarthritis) JOINTS  . Osteopenia   . Painful orthopaedic hardware ([place])    left foot  . PONV (postoperative nausea and vomiting) SEVERE  . Rotator cuff disorder LEFT SHOULDER RTC IMPINGMENT  . Sleep apnea    mouth guard, no cpap   . Sleep apnea in adult  06/26/2017  . Spondylolisthesis of lumbar region   . 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 Wears glasses      Social History   Socioeconomic History  . Marital status: Married    Spouse name: Not on file  . Number of children: Not on file  . Years of education: Not on file  . Highest education level: Not on file  Occupational History    Comment: retired Therapist, sports  Tobacco Use  . Smoking status: Never Smoker  . Smokeless tobacco: Never Used  Vaping Use  . Vaping Use: Never used  Substance and Sexual Activity  . Alcohol use: Yes    Comment: RARE  . Drug use: No  . Sexual activity: Not on file  Other Topics Concern  . Not on file  Social History Narrative   Lives with husband Dr Ruben Reason.   They have 2 adult children that live out of state.   Social Determinants of Health   Financial Resource Strain:   . Difficulty of Paying Living Expenses:   Food Insecurity:   . Worried About Charity fundraiser in the Last Year:   . Arboriculturist in the Last Year:   Transportation Needs:   . Film/video editor (Medical):   Marland Kitchen Lack of Transportation (Non-Medical):   Physical Activity:   . Days of Exercise per Week:   . Minutes of Exercise per Session:  Stress:   . Feeling of Stress :   Social Connections:   . Frequency of Communication with Friends and Family:   . Frequency of Social Gatherings with Friends and Family:   . Attends Religious Services:   . Active Member of Clubs or Organizations:   . Attends Archivist Meetings:   Marland Kitchen Marital Status:   Intimate Partner Violence:   . Fear of Current or Ex-Partner:   . Emotionally Abused:   Marland Kitchen Physically Abused:   . Sexually Abused:     Past Surgical History:  Procedure Laterality Date  . BACK SURGERY     Dr Arnoldo Morale- spondylosis  . BILATERAL CARPAL TUNNEL RELEASE  1994  . BILATERAL ELBOW SURG.  1999  . BILATERAL SALPINGOOPHECTOMY  1993   POST-OP URETER REPAIR 12 DAYS AFTER   . CATARACT EXTRACTION, BILATERAL     . COLONOSCOPY    . EYE SURGERY    . FOOT ARTHRODESIS Left 07/10/2016   Procedure: LEFT 2ND TARSAL METATARSAL ARTHRODESIS  GASTROC RECESSION LEFT LAPIDUS MODIFIED MCBRIDE BUIONECTOMY;  Surgeon: Wylene Simmer, MD;  Location: [place];  Service: Orthopedics;  Laterality: Left;  Marland Kitchen GASTROC RECESSION EXTREMITY Left 07/10/2016   Procedure: GASTROC RECESSION EXTREMITY;  Surgeon: Wylene Simmer, MD;  Location: [place];  Service: Orthopedics;  Laterality: Left;  . HARDWARE REMOVAL Left 05/20/2018   Procedure: Left foot removal of deep implants;  Surgeon: Wylene Simmer, MD;  Location: [place];  Service: Orthopedics;  Laterality: Left;  75mn  . LEFT THUMB JOINT REPLACEMENT  2005   RIGHT DONE IN 2004  . OOPHORECTOMY    . POLYPECTOMY    . RIGHT KNEE ARTHROSCOPY  X2  BEFORE 2011  . RIGHT KNEE ARTHROSCOPY/ PARTIAL LATERAL MENISECTOMY/ TRICOMPARTMENT CHONDROPLASTY/ DECOMPRESSION CYST  10-26-2009  . RIGHT KNEE CLOSED MANIPULATION  09-11-2010  . RIGHT SHOULDER ARTHROSCOPY  2010  &  2004  . TIBIA CYST REMOVED AND ORIF LEG FX  1958  . TONSILLECTOMY  1968  . TOTAL KNEE ARTHROPLASTY  07-16-2010   RIGHT  . TOTAL THYROIDECTOMY  2009   CANCER  (POST-OP BLEED)  AND RADIATION TX  . trigger fingers Right 2013   3rd and 4th fingers  . UPPER GASTROINTESTINAL ENDOSCOPY    . VAGINAL HYSTERECTOMY  1990    Family History  Problem Relation Age of Onset  . Colon polyps Sister 553 . Alzheimer's disease Mother   . Heart disease Father   . Cirrhosis Father   . Heart attack Father   . Macular degeneration Father   . Colon cancer Neg Hx     Allergies  Allergen Reactions  . Statins Other (See Comments)    Pt has tried multiple and cannot tolerate  . Sulfa Antibiotics Hives    Current Outpatient Medications on File Prior to Visit  Medication Sig Dispense Refill  . albuterol (VENTOLIN HFA) 108 (90 Base) MCG/ACT inhaler Inhale 2 puffs into the lungs every 6 (six)  hours as needed for wheezing or shortness of breath. 18 g 6  . blood glucose meter kit and supplies KIT Test blood sugar once daily. Dx code: E11.9 1 each 0  . Blood Glucose Monitoring Suppl (FREESTYLE LITE) DEVI Check blood sugar no more than twice daily 1 each 0  . esomeprazole (NEXIUM) 40 MG capsule Take 40 mg by mouth daily as needed (acid reflux).     .Marland KitchenFARXIGA 10 MG TABS tablet TAKE 1 TABLET BY MOUTH ONCE A  DAY 90 tablet 3  . glucose blood (FREESTYLE LITE) test strip CHECK BLOOD SUGARS NO MORE THAN TWICE DAILY 100 each 12  . JANUVIA 100 MG tablet TAKE 1 TABLET BY MOUTH DAILY. 30 tablet 3  . Lancets (FREESTYLE) lancets Check blood sugars no more than twice daily 100 each 12  . levothyroxine (SYNTHROID) 125 MCG tablet Take 1 tablet (125 mcg total) by mouth daily before breakfast. 90 tablet 3  . losartan (COZAAR) 50 MG tablet TAKE 1 TABLET (50 MG TOTAL) BY MOUTH DAILY. *PT NEED APPT* 90 tablet 0  . metoprolol succinate (TOPROL-XL) 50 MG 24 hr tablet TAKE 1 TABLET (50 MG TOTAL) BY MOUTH DAILY. TAKE WITH OR IMMEDIATELY FOLLOWING A MEAL. 90 tablet 3  . montelukast (SINGULAIR) 10 MG tablet Take 1 tablet (10 mg total) by mouth at bedtime. 90 tablet 3  . NASONEX 50 MCG/ACT nasal spray PLACE 2 SPRAYS INTO THE NOSE DAILY. (Patient taking differently: Place 2 sprays into the nose daily. ) 17 g 9  . nitrofurantoin (MACRODANTIN) 50 MG capsule Take 1 capsule (50 mg total) by mouth at bedtime. 90 capsule 3  . ondansetron (ZOFRAN) 4 MG tablet TAKE 1 TABLET BY MOUTH EVERY 8 HOURS AS NEEDED FOR NAUSEA AND VOMITING 30 tablet 1  . OXAYDO 7.5 MG TABS TAKE 1 TABLET BY MOUTH EVERY 8 HOURS AS NEEDED 42 tablet 0  . Pseudoephedrine-DM-GG (ROBITUSSIN CF PO) Take 2 capsules by mouth daily.    Marland Kitchen topiramate ER (QUDEXY XR) 100 MG CS24 sprinkle capsule Take 1 capsule (100 mg total) by mouth at bedtime. Appointment needed for further refills. 30 capsule 0  . traZODone (DESYREL) 100 MG tablet TAKE 2.5 TABLETS BY MOUTH AT  BEDTIME. 225 tablet 3  . UBRELVY 50 MG TABS TAKE 1 TABLET (50 MG) BY MOUTH EVERY 2 HOURS AS NEEDED. MAX 200MG A DAY 10 tablet 1  . zolpidem (AMBIEN) 5 MG tablet TAKE 1/2 TABLET BY MOUTH AT BEDTIME AS NEEDED FOR SLEEP 30 tablet 1  . Multiple Vitamins-Minerals (ICAPS AREDS 2) CAPS Take 1 capsule by mouth 2 (two) times daily.  (Patient not taking: Reported on 02/14/2020)     No current facility-administered medications on file prior to visit.    BP 110/68   Pulse 74   Resp 18   Ht _0  (1.727 m)   Wt 187 lb (84.8 kg)   SpO2 98%   BMI 28.43 kg/m       Objective:   Physical Exam  General- No acute distress. Pleasant patient. Neck- Full range of motion, no jvd Lungs- Clear, even and unlabored. Heart- regular rate and rhythm. Neurologic- CNII- XII grossly intact. Abdomen-soft, nt, nd, +bs, no suprapubic tenderness. No rebound or guarding. Back- no cva tenderness.       Assessment & Plan:  You do  appear to have a urinary tract infection. I am prescribing cefdnir antibiotic for the probable infection. Hydrate well. I am sending out a urine culture. During the interim if your signs and symptoms worsen rather than improving please notify us. We will notify your when the culture results are back.  Follow up in 7 days or as needed.  Mackie Pai, PA-C   Time spent with patient today was 20  minutes which consisted of chart review, discussing diagnosis, work up, treatment and documentation.

## 2020-02-15 ENCOUNTER — Telehealth: Payer: Self-pay | Admitting: Neurology

## 2020-02-15 ENCOUNTER — Ambulatory Visit: Payer: 59 | Attending: Physician Assistant | Admitting: Physical Therapy

## 2020-02-15 ENCOUNTER — Encounter: Payer: Self-pay | Admitting: Physical Therapy

## 2020-02-15 DIAGNOSIS — M62830 Muscle spasm of back: Secondary | ICD-10-CM | POA: Insufficient documentation

## 2020-02-15 DIAGNOSIS — M545 Low back pain, unspecified: Secondary | ICD-10-CM

## 2020-02-15 DIAGNOSIS — M546 Pain in thoracic spine: Secondary | ICD-10-CM | POA: Insufficient documentation

## 2020-02-15 NOTE — Therapy (Signed)
[place], [place], [address] Phone: [phone]   Fax:  [phone]  Physical Therapy Treatment  Patient Details  Name: Debbie Hayes MRN: [id number] Date of Birth: 1951-03-25 Referring Provider (PT): Sandre Kitty Date: 02/15/2020   PT End of Session - 02/15/20 1134    Visit Number 2    Date for PT Re-Evaluation 03/26/20    PT Start Time 1100    PT Stop Time 1147    PT Time Calculation (min) 47 min    Activity Tolerance Patient limited by pain    Behavior During Therapy [facility] for tasks assessed/performed           Past Medical History:  Diagnosis Date  . Allergy   . Cancer [facility])    thyroid cancer  . Cataract   . Chronic migraine BOTOX INJECTION EVERY 3 MONTHS  . Chronic pain in right shoulder   . Constipation   . Diabetes mellitus   . Disorder of inner ear CAUSES VERTIGO OCCASIONALLY  . Fibromyalgia   . GERD (gastroesophageal reflux disease)   . Hemorrhoid   . History of bladder infections   . History of thyroid cancer 2009  S/P TOTAL THYROIDECTOMY AND RADIATION  . Hyperlipidemia   . Hypertension CARDIOLOGIST- DR Wynonia Lawman- WILL REQUEST LATE NOTE   DENIES S & S  . Hypothyroidism   . Insomnia   . Left shoulder pain   . Macular degeneration   . MRSA infection    nasal treated more than 15 years ago  . Normal nuclear stress test 01-25-2009  . OA (osteoarthritis) JOINTS  . Osteopenia   . Painful orthopaedic hardware ([place])    left foot  . PONV (postoperative nausea and vomiting) SEVERE  . Rotator cuff disorder LEFT SHOULDER RTC IMPINGMENT  . Sleep apnea    mouth guard, no cpap   . Sleep apnea in adult 06/26/2017  . Spondylolisthesis of lumbar region   . 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 Wears glasses     Past Surgical History:  Procedure Laterality Date  . BACK SURGERY     Dr Arnoldo Morale- spondylosis  . BILATERAL CARPAL TUNNEL RELEASE  1994  . BILATERAL  ELBOW SURG.  1999  . BILATERAL SALPINGOOPHECTOMY  1993   POST-OP URETER REPAIR 12 DAYS AFTER   . CATARACT EXTRACTION, BILATERAL    . COLONOSCOPY    . EYE SURGERY    . FOOT ARTHRODESIS Left 07/10/2016   Procedure: LEFT 2ND TARSAL METATARSAL ARTHRODESIS  GASTROC RECESSION LEFT LAPIDUS MODIFIED MCBRIDE BUIONECTOMY;  Surgeon: Wylene Simmer, MD;  Location: [place];  Service: Orthopedics;  Laterality: Left;  Marland Kitchen GASTROC RECESSION EXTREMITY Left 07/10/2016   Procedure: GASTROC RECESSION EXTREMITY;  Surgeon: Wylene Simmer, MD;  Location: [place];  Service: Orthopedics;  Laterality: Left;  . HARDWARE REMOVAL Left 05/20/2018   Procedure: Left foot removal of deep implants;  Surgeon: Wylene Simmer, MD;  Location: [place];  Service: Orthopedics;  Laterality: Left;  38min  . LEFT THUMB JOINT REPLACEMENT  2005   RIGHT DONE IN 2004  . OOPHORECTOMY    . POLYPECTOMY    . RIGHT KNEE ARTHROSCOPY  X2  BEFORE 2011  . RIGHT KNEE ARTHROSCOPY/ PARTIAL LATERAL MENISECTOMY/ TRICOMPARTMENT CHONDROPLASTY/ DECOMPRESSION CYST  10-26-2009  . RIGHT KNEE CLOSED MANIPULATION  09-11-2010  . RIGHT SHOULDER ARTHROSCOPY  2010  &  2004  . TIBIA CYST REMOVED  AND ORIF LEG FX  1958  . TONSILLECTOMY  1968  . TOTAL KNEE ARTHROPLASTY  07-16-2010   RIGHT  . TOTAL THYROIDECTOMY  2009   CANCER  (POST-OP BLEED)  AND RADIATION [place]  . trigger fingers Right 2013   3rd and 4th fingers  . UPPER GASTROINTESTINAL ENDOSCOPY    . VAGINAL HYSTERECTOMY  1990    There were no vitals filed for this visit.   Subjective Assessment - 02/15/20 1133    Subjective Patient reports that she tried the exercises and they made her pain worse.    Currently in Pain? Yes    Pain Score 5     Pain Location Back    Pain Orientation Mid;Lower    Aggravating Factors  exercises and movements                             [facility] Adult PT Treatment/Exercise - 02/15/20 0001      Modalities    Modalities Electrical Stimulation;Moist Heat      Moist Heat Therapy   Number Minutes Moist Heat 15 Minutes    Moist Heat Location Lumbar Spine      Electrical Stimulation   Electrical Stimulation Location T/L area    Electrical Stimulation Action IFC    Electrical Stimulation Parameters sitting    Electrical Stimulation Goals Pain      Manual Therapy   Manual Therapy Soft tissue mobilization    Soft tissue mobilization to the thoracic and lumbar paraspinals in right sidelying                    PT Short Term Goals - 02/15/20 1136      PT SHORT TERM GOAL #1   Title independent with initial HEP    Status On-going             PT Long Term Goals - 01/25/20 1716      PT LONG TERM GOAL #1   Title independent with proper posture and body mechanics    Time 8    Period Weeks    Status New      PT LONG TERM GOAL #2   Title increase lumbar ROM 25%    Time 8    Period Weeks    Status New      PT LONG TERM GOAL #3   Title report able to get up and down from sitting without pain    Time 8    Period Weeks    Status New      PT LONG TERM GOAL #4   Title decrease pain 25%    Time 8    Period Weeks    Status New                 Plan - 02/15/20 1135    Clinical Impression Statement Patient reports that the pelvic tilts increased pain as did the other exercises, I decided to try to address some of thetightness and spasms with STM, she was tender and tight but seemed to think this was helping, finished with estim and heat to try to dissipate soreness    PT Next Visit Plan see how todays treatment did, try some easy core activation    Consulted and Agree with Plan of Care Patient           Patient will benefit from skilled therapeutic intervention in order to improve the following deficits and impairments:  Decreased range of motion, Difficulty walking, Increased muscle spasms, Decreased activity tolerance, Pain, Improper body mechanics, Impaired  flexibility, Decreased strength, Postural dysfunction, Decreased mobility  Visit Diagnosis: Pain in thoracic spine  Muscle spasm of back  Acute bilateral low back pain without sciatica     Problem List Patient Active Problem List   Diagnosis Date Noted  . Trigger finger, left middle finger 07/27/2019  . Chronic neck pain 11/10/2018  . Orthopedic hardware present 02/17/2018  . Diabetes mellitus due to underlying condition with complication ([place]) 41/99/1444  . 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 Arthralgia 12/15/2017  . Joint pain 12/15/2017  . Migraine, transformed 12/15/2017  . 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 Pain of left shoulder joint on movement 10/19/2017  . OSA (obstructive sleep apnea) 06/26/2017  . Snoring 03/25/2016  . Palpitations 03/25/2016  . Headache 03/25/2016  . Sensory disorder of trigeminal nerve 11/26/2015  . Spondylolisthesis of lumbar region 08/09/2014  . Lumbar adjacent segment disease with spondylolisthesis 08/09/2014  . Back pain 02/17/2014  . Macular degeneration 02/17/2014  . Backache 02/17/2014  . Other and unspecified hyperlipidemia 10/14/2013  . Hyperlipidemia 10/14/2013  . Onychomycosis 12/01/2012  . Arthritis 04/12/2012  . Hypothyroid 04/12/2012  . Hypothyroidism 04/12/2012    Sumner Boast., PT 02/15/2020, 11:37 AM  [place] [address] [place], [place], [address] Phone: [phone]   Fax:  [phone]  Name: Debbie Hayes MRN: [id number] Date of Birth: 27-Jan-1951

## 2020-02-15 NOTE — Telephone Encounter (Signed)
PA completed for the patient through cover my meds/ medimpact. Will wait for response. KEYLeretha Pol  PA approved for up to 16 tablets in 30 day period until 08/16/20

## 2020-02-16 ENCOUNTER — Other Ambulatory Visit: Payer: Self-pay | Admitting: Medical

## 2020-02-16 MED FILL — ALBUTEROL SULFATE HFA 108 (: 108 (90 BAS | 25 days supply | Qty: 18 | Fill #2

## 2020-02-17 LAB — URINE CULTURE
MICRO NUMBER:: 10726626
SPECIMEN QUALITY:: ADEQUATE

## 2020-02-17 MED FILL — CEPHALEXIN 500 MG CAPSULE: 500 | 8 days supply | Qty: 32 | Fill #0

## 2020-02-17 MED FILL — LEVOTHYROXINE 125 MCG TABLE: 125 | 90 days supply | Qty: 90 | Fill #0

## 2020-02-21 ENCOUNTER — Encounter: Payer: Self-pay | Admitting: Medical

## 2020-02-21 ENCOUNTER — Ambulatory Visit: Payer: 59 | Admitting: Physical Therapy

## 2020-02-21 ENCOUNTER — Encounter: Payer: Self-pay | Admitting: Physical Therapy

## 2020-02-21 ENCOUNTER — Other Ambulatory Visit: Payer: Self-pay

## 2020-02-21 DIAGNOSIS — M546 Pain in thoracic spine: Secondary | ICD-10-CM | POA: Diagnosis not present

## 2020-02-21 DIAGNOSIS — M545 Low back pain, unspecified: Secondary | ICD-10-CM

## 2020-02-21 DIAGNOSIS — M62830 Muscle spasm of back: Secondary | ICD-10-CM | POA: Diagnosis not present

## 2020-02-21 MED FILL — traZODone HCL 100 MG TABS: 100 | 90 days supply | Qty: 225 | Fill #0

## 2020-02-21 NOTE — Therapy (Signed)
[place], [place], [address] Phone: [phone]   Fax:  [phone]  Physical Therapy Treatment  Patient Details  Name: Debbie Hayes MRN: [id number] Date of Birth: 1951-03-08 Referring Provider (PT): Sandre Kitty Date: 02/21/2020   PT End of Session - 02/21/20 1637    Visit Number 3    Date for PT Re-Evaluation 03/26/20    PT Start Time 1611    PT Stop Time 1655    PT Time Calculation (min) 44 min    Activity Tolerance Patient limited by pain    Behavior During Therapy [facility] for tasks assessed/performed           Past Medical History:  Diagnosis Date  . Allergy   . Cancer [facility])    thyroid cancer  . Cataract   . Chronic migraine BOTOX INJECTION EVERY 3 MONTHS  . Chronic pain in right shoulder   . Constipation   . Diabetes mellitus   . Disorder of inner ear CAUSES VERTIGO OCCASIONALLY  . Fibromyalgia   . GERD (gastroesophageal reflux disease)   . Hemorrhoid   . History of bladder infections   . History of thyroid cancer 2009  S/P TOTAL THYROIDECTOMY AND RADIATION  . Hyperlipidemia   . Hypertension CARDIOLOGIST- DR Wynonia Lawman- WILL REQUEST LATE NOTE   DENIES S & S  . Hypothyroidism   . Insomnia   . Left shoulder pain   . Macular degeneration   . MRSA infection    nasal treated more than 15 years ago  . Normal nuclear stress test 01-25-2009  . OA (osteoarthritis) JOINTS  . Osteopenia   . Painful orthopaedic hardware ([place])    left foot  . PONV (postoperative nausea and vomiting) SEVERE  . Rotator cuff disorder LEFT SHOULDER RTC IMPINGMENT  . Sleep apnea    mouth guard, no cpap   . Sleep apnea in adult 06/26/2017  . Spondylolisthesis of lumbar region   . 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 Wears glasses     Past Surgical History:  Procedure Laterality Date  . BACK SURGERY     Dr Arnoldo Morale- spondylosis  . BILATERAL CARPAL TUNNEL RELEASE  1994  . BILATERAL  ELBOW SURG.  1999  . BILATERAL SALPINGOOPHECTOMY  1993   POST-OP URETER REPAIR 12 DAYS AFTER   . CATARACT EXTRACTION, BILATERAL    . COLONOSCOPY    . EYE SURGERY    . FOOT ARTHRODESIS Left 07/10/2016   Procedure: LEFT 2ND TARSAL METATARSAL ARTHRODESIS  GASTROC RECESSION LEFT LAPIDUS MODIFIED MCBRIDE BUIONECTOMY;  Surgeon: Wylene Simmer, MD;  Location: [place];  Service: Orthopedics;  Laterality: Left;  Marland Kitchen GASTROC RECESSION EXTREMITY Left 07/10/2016   Procedure: GASTROC RECESSION EXTREMITY;  Surgeon: Wylene Simmer, MD;  Location: [place];  Service: Orthopedics;  Laterality: Left;  . HARDWARE REMOVAL Left 05/20/2018   Procedure: Left foot removal of deep implants;  Surgeon: Wylene Simmer, MD;  Location: [place];  Service: Orthopedics;  Laterality: Left;  39min  . LEFT THUMB JOINT REPLACEMENT  2005   RIGHT DONE IN 2004  . OOPHORECTOMY    . POLYPECTOMY    . RIGHT KNEE ARTHROSCOPY  X2  BEFORE 2011  . RIGHT KNEE ARTHROSCOPY/ PARTIAL LATERAL MENISECTOMY/ TRICOMPARTMENT CHONDROPLASTY/ DECOMPRESSION CYST  10-26-2009  . RIGHT KNEE CLOSED MANIPULATION  09-11-2010  . RIGHT SHOULDER ARTHROSCOPY  2010  &  2004  . TIBIA CYST REMOVED  AND ORIF LEG FX  1958  . TONSILLECTOMY  1968  . TOTAL KNEE ARTHROPLASTY  07-16-2010   RIGHT  . TOTAL THYROIDECTOMY  2009   CANCER  (POST-OP BLEED)  AND RADIATION [place]  . trigger fingers Right 2013   3rd and 4th fingers  . UPPER GASTROINTESTINAL ENDOSCOPY    . VAGINAL HYSTERECTOMY  1990    There were no vitals filed for this visit.   Subjective Assessment - 02/21/20 1636    Subjective Patient reports that the last treatment "really helped", "I felt looser and had less pain"    Currently in Pain? Yes    Pain Score 3     Pain Location Back    Pain Relieving Factors the "massage helped"                             [facility] Adult PT Treatment/Exercise - 02/21/20 0001      Modalities   Modalities  Electrical Stimulation;Moist Heat      Moist Heat Therapy   Number Minutes Moist Heat 15 Minutes    Moist Heat Location Lumbar Spine      Electrical Stimulation   Electrical Stimulation Location T/L area    Electrical Stimulation Action IFC    Electrical Stimulation Parameters sitting    Electrical Stimulation Goals Pain      Manual Therapy   Manual Therapy Soft tissue mobilization    Soft tissue mobilization to the thoracic and lumbar paraspinals in right sidelying                    PT Short Term Goals - 02/21/20 1640      PT SHORT TERM GOAL #1   Title independent with initial HEP    Status On-going             PT Long Term Goals - 02/21/20 1640      PT LONG TERM GOAL #1   Title independent with proper posture and body mechanics    Status On-going      PT LONG TERM GOAL #2   Title increase lumbar ROM 25%    Status On-going      PT LONG TERM GOAL #3   Title report able to get up and down from sitting without pain    Status On-going      PT LONG TERM GOAL #4   Title decrease pain 25%    Status On-going                 Plan - 02/21/20 1638    Clinical Impression Statement Pateint reported that she felt looser and better with the last treatment, she reports that she feels that if she continues to improve she may want to try to do some walking for exercises.  She felt looser and on the left, a little bit of spasms in the right and lumbar areas.    PT Next Visit Plan may continue with current plan and add core activation as she tolerates    Consulted and Agree with Plan of Care Patient           Patient will benefit from skilled therapeutic intervention in order to improve the following deficits and impairments:  Decreased range of motion, Difficulty walking, Increased muscle spasms, Decreased activity tolerance, Pain, Improper body mechanics, Impaired flexibility, Decreased strength, Postural dysfunction, Decreased mobility  Visit  Diagnosis: Pain in thoracic spine  Muscle spasm of  back  Acute bilateral low back pain without sciatica     Problem List Patient Active Problem List   Diagnosis Date Noted  . Trigger finger, left middle finger 07/27/2019  . Chronic neck pain 11/10/2018  . Orthopedic hardware present 02/17/2018  . Diabetes mellitus due to underlying condition with complication ([place]) 92/42/6834  . 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 Arthralgia 12/15/2017  . Joint pain 12/15/2017  . Migraine, transformed 12/15/2017  . 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 Pain of left shoulder joint on movement 10/19/2017  . OSA (obstructive sleep apnea) 06/26/2017  . Snoring 03/25/2016  . Palpitations 03/25/2016  . Headache 03/25/2016  . Sensory disorder of trigeminal nerve 11/26/2015  . Spondylolisthesis of lumbar region 08/09/2014  . Lumbar adjacent segment disease with spondylolisthesis 08/09/2014  . Back pain 02/17/2014  . Macular degeneration 02/17/2014  . Backache 02/17/2014  . Other and unspecified hyperlipidemia 10/14/2013  . Hyperlipidemia 10/14/2013  . Onychomycosis 12/01/2012  . Arthritis 04/12/2012  . Hypothyroid 04/12/2012  . Hypothyroidism 04/12/2012    Sumner Boast., PT 02/21/2020, 4:40 PM  [place] [address] [place], [place], [address] Phone: [phone]   Fax:  [phone]  Name: Debbie Hayes MRN: [id number] Date of Birth: [birth year]

## 2020-02-25 MED FILL — JANUVIA 100 MG TABLET: 100 | 30 days supply | Qty: 30 | Fill #2

## 2020-03-07 ENCOUNTER — Other Ambulatory Visit: Payer: Self-pay

## 2020-03-07 ENCOUNTER — Encounter: Payer: Self-pay | Admitting: Physical Therapy

## 2020-03-07 ENCOUNTER — Ambulatory Visit: Payer: 59 | Attending: Physician Assistant | Admitting: Physical Therapy

## 2020-03-07 DIAGNOSIS — M62830 Muscle spasm of back: Secondary | ICD-10-CM | POA: Insufficient documentation

## 2020-03-07 DIAGNOSIS — M545 Low back pain, unspecified: Secondary | ICD-10-CM

## 2020-03-07 DIAGNOSIS — M546 Pain in thoracic spine: Secondary | ICD-10-CM | POA: Insufficient documentation

## 2020-03-07 NOTE — Therapy (Signed)
[facility] [place] [address] [place], [place], [address] Phone: [phone]   Fax:  [phone]  Physical Therapy Treatment  Patient Details  Name: Debbie Hayes MRN: [id number] Date of Birth: [birth year] Referring Provider (PT): Daiva Eves Date: 03/07/2020   PT End of Session - 03/07/20 1653    Visit Number 4    Date for PT Re-Evaluation 03/26/20    PT Start Time 1527    PT Stop Time 1606    PT Time Calculation (min) 39 min    Activity Tolerance Patient limited by pain    Behavior During Therapy [facility] for tasks assessed/performed           Past Medical History:  Diagnosis Date  . Allergy   . Cancer [facility])    thyroid cancer  . Cataract   . Chronic migraine BOTOX INJECTION EVERY 3 MONTHS  . Chronic pain in right shoulder   . Constipation   . Diabetes mellitus   . Disorder of inner ear CAUSES VERTIGO OCCASIONALLY  . Fibromyalgia   . GERD (gastroesophageal reflux disease)   . Hemorrhoid   . History of bladder infections   . History of thyroid cancer 2009  S/P TOTAL THYROIDECTOMY AND RADIATION  . Hyperlipidemia   . Hypertension CARDIOLOGIST- DR Donnie Aho- WILL REQUEST LATE NOTE   DENIES S & S  . Hypothyroidism   . Insomnia   . Left shoulder pain   . Macular degeneration   . MRSA infection    nasal treated more than 15 years ago  . Normal nuclear stress test 01-25-2009  . OA (osteoarthritis) JOINTS  . Osteopenia   . Painful orthopaedic hardware ([facility])    left foot  . PONV (postoperative nausea and vomiting) SEVERE  . Rotator cuff disorder LEFT SHOULDER RTC IMPINGMENT  . Sleep apnea    mouth guard, no cpap   . Sleep apnea in adult 06/26/2017  . Spondylolisthesis of lumbar region   . 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 Wears glasses     Past Surgical History:  Procedure Laterality Date  . BACK SURGERY     Dr Lovell Sheehan- spondylosis  . BILATERAL CARPAL TUNNEL RELEASE  1994  . BILATERAL  ELBOW SURG.  1999  . BILATERAL SALPINGOOPHECTOMY  1993   POST-OP URETER REPAIR 12 DAYS AFTER   . CATARACT EXTRACTION, BILATERAL    . COLONOSCOPY    . EYE SURGERY    . FOOT ARTHRODESIS Left 07/10/2016   Procedure: LEFT 2ND TARSAL METATARSAL ARTHRODESIS  GASTROC RECESSION LEFT LAPIDUS MODIFIED MCBRIDE BUIONECTOMY;  Surgeon: Toni Arthurs, MD;  Location: [facility];  Service: Orthopedics;  Laterality: Left;  Marland Kitchen GASTROC RECESSION EXTREMITY Left 07/10/2016   Procedure: GASTROC RECESSION EXTREMITY;  Surgeon: Toni Arthurs, MD;  Location: [facility];  Service: Orthopedics;  Laterality: Left;  . HARDWARE REMOVAL Left 05/20/2018   Procedure: Left foot removal of deep implants;  Surgeon: Toni Arthurs, MD;  [facility];  Service: Orthopedics;  Laterality: Left;   . LEFT THUMB JOINT REPLACEMENT  2005   RIGHT DONE IN 2004  . OOPHORECTOMY    . POLYPECTOMY    . RIGHT KNEE ARTHROSCOPY  X2  BEFORE 2011  . RIGHT KNEE ARTHROSCOPY/ PARTIAL LATERAL MENISECTOMY/ TRICOMPARTMENT CHONDROPLASTY/ DECOMPRESSION CYST  10-26-2009  . RIGHT KNEE CLOSED MANIPULATION  09-11-2010  . RIGHT SHOULDER ARTHROSCOPY  2010  &  2004  . TIBIA CYST REMOVED  AND ORIF LEG FX  1958  . TONSILLECTOMY  1968  . TOTAL KNEE ARTHROPLASTY  07-16-2010   RIGHT  . TOTAL THYROIDECTOMY  2009   CANCER  (POST-OP BLEED)  AND RADIATION [place]  . trigger fingers Right 2013   3rd and 4th fingers  . UPPER GASTROINTESTINAL ENDOSCOPY    . VAGINAL HYSTERECTOMY  1990    There were no vitals filed for this visit.                      [place] Adult PT Treatment/Exercise - 03/07/20 0001      Manual Therapy   Manual Therapy Soft tissue mobilization    Soft tissue mobilization to the thoracic and lumbar paraspinals in right sidelying, added some vibration                  PT Education - 03/07/20 1652    Education Details she talked about issues with her SITS bones and having to  drive in a car and how painful, I helped her looks up ischial tuberosity cushions and we talked about her pain and how theis could help, she may order    Person(s) Educated Patient    Methods Explanation;Demonstration;Other (comment)   online   Comprehension Verbalized understanding;Returned demonstration            PT Short Term Goals - 02/21/20 1640      PT SHORT TERM GOAL #1   Title independent with initial HEP    Status On-going             PT Long Term Goals - 03/07/20 1656      PT LONG TERM GOAL #1   Title independent with proper posture and body mechanics    Status Partially Met                 Plan - 03/07/20 1654    Clinical Impression Statement Patient reports that this past time was two weeks b/n sessions, she feels like this caused her to have some increaesd pain prior to returning, she was tight but still less tightness and pain than the first visit.  We talked about her issues with sitting, also talked about in the future having regular massage therapist, but also has to find the right one due to her many medical issues    PT Next Visit Plan may continue with current plan and add core activation as she tolerates    Consulted and Agree with Plan of Care Patient           Patient will benefit from skilled therapeutic intervention in order to improve the following deficits and impairments:  Decreased range of motion, Difficulty walking, Increased muscle spasms, Decreased activity tolerance, Pain, Improper body mechanics, Impaired flexibility, Decreased strength, Postural dysfunction, Decreased mobility  Visit Diagnosis: Pain in thoracic spine  Muscle spasm of back  Acute bilateral low back pain without sciatica     Problem List Patient Active Problem List   Diagnosis Date Noted  . Trigger finger, left middle finger 07/27/2019  . Chronic neck pain 11/10/2018  . Orthopedic hardware present 02/17/2018  . Diabetes mellitus due to underlying  condition with complication ([place]) 25/36/6440  . 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 Arthralgia 12/15/2017  . Joint pain 12/15/2017  . Migraine, transformed 12/15/2017  . 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 Pain of left shoulder joint on movement 10/19/2017  . OSA (obstructive sleep  apnea) 06/26/2017  . Snoring 03/25/2016  . Palpitations 03/25/2016  . Headache 03/25/2016  . Sensory disorder of trigeminal nerve 11/26/2015  . Spondylolisthesis of lumbar region 08/09/2014  . Lumbar adjacent segment disease with spondylolisthesis 08/09/2014  . Back pain 02/17/2014  . Macular degeneration 02/17/2014  . Backache 02/17/2014  . Other and unspecified hyperlipidemia 10/14/2013  . Hyperlipidemia 10/14/2013  . Onychomycosis 12/01/2012  . Arthritis 04/12/2012  . Hypothyroid 04/12/2012  . Hypothyroidism 04/12/2012    Sumner Boast., PT 03/07/2020, 5:18 PM  [place] [address] [place], [place], [address] Phone: [phone]   Fax:  [phone]  Name: Debbie Hayes MRN: [id number] Date of Birth: [birth year]

## 2020-03-12 ENCOUNTER — Other Ambulatory Visit: Payer: Self-pay | Admitting: Neurology

## 2020-03-12 DIAGNOSIS — G43711 Chronic migraine without aura, intractable, with status migrainosus: Secondary | ICD-10-CM

## 2020-03-12 MED FILL — ALBUTEROL SULFATE HFA 108 (: 108 (90 BAS | 25 days supply | Qty: 18 | Fill #3

## 2020-03-13 ENCOUNTER — Other Ambulatory Visit: Payer: Self-pay | Admitting: Family Medicine

## 2020-03-13 DIAGNOSIS — G43711 Chronic migraine without aura, intractable, with status migrainosus: Secondary | ICD-10-CM

## 2020-03-13 MED FILL — METOPROLOL SUCCINATE ER 50: 50 | 90 days supply | Qty: 90 | Fill #2

## 2020-03-13 MED FILL — UBRELVY 50 MG TABS: 50 | 5 days supply | Qty: 10 | Fill #0

## 2020-03-13 MED FILL — NITROFURANTOIN MCR 50 MG CA: 50 | 90 days supply | Qty: 90 | Fill #2

## 2020-03-13 MED FILL — FARXIGA 10 MG TABLET: 10 | 90 days supply | Qty: 90 | Fill #1

## 2020-03-14 ENCOUNTER — Ambulatory Visit: Payer: 59 | Admitting: Physical Therapy

## 2020-03-26 ENCOUNTER — Other Ambulatory Visit: Payer: Self-pay | Admitting: Family Medicine

## 2020-03-26 MED FILL — ZOLPIDEM TARTRATE 5 MG TABS: 5 | 60 days supply | Qty: 30 | Fill #0

## 2020-03-26 MED FILL — JANUVIA 100 MG TABLET: 100 | 30 days supply | Qty: 30 | Fill #3

## 2020-03-26 NOTE — Telephone Encounter (Signed)
Requesting:Zolpidem Contract: none UDS:10/19/2019 Last Visit:02/14/2020 Next Visit:none Last Refill: 11/28/2019  Please Advise

## 2020-03-28 ENCOUNTER — Ambulatory Visit: Payer: 59 | Admitting: Physical Therapy

## 2020-03-30 ENCOUNTER — Encounter: Payer: Self-pay | Admitting: Podiatry

## 2020-03-30 ENCOUNTER — Ambulatory Visit (INDEPENDENT_AMBULATORY_CARE_PROVIDER_SITE_OTHER): Payer: 59 | Admitting: Podiatry

## 2020-03-30 ENCOUNTER — Other Ambulatory Visit: Payer: Self-pay

## 2020-03-30 ENCOUNTER — Ambulatory Visit (INDEPENDENT_AMBULATORY_CARE_PROVIDER_SITE_OTHER): Payer: 59

## 2020-03-30 DIAGNOSIS — L600 Ingrowing nail: Secondary | ICD-10-CM | POA: Diagnosis not present

## 2020-03-30 DIAGNOSIS — M779 Enthesopathy, unspecified: Secondary | ICD-10-CM | POA: Diagnosis not present

## 2020-03-30 DIAGNOSIS — M778 Other enthesopathies, not elsewhere classified: Secondary | ICD-10-CM

## 2020-03-30 MED ORDER — NEOMYCIN-POLYMYXIN-HC 3.5-10000-1 OT SOLN
3.0000 [drp] | Freq: Four times a day (QID) | OTIC | 0 refills | Status: DC
Start: 2020-03-30 — End: 2020-05-02

## 2020-03-30 MED FILL — LOSARTAN POTASSIUM 50 MG TA: 50 | 90 days supply | Qty: 90 | Fill #0

## 2020-03-30 MED FILL — NEO/POLYMYXIN/HC EAR SOLN: 3.5-10000-1 | 16 days supply | Qty: 10 | Fill #0

## 2020-03-30 NOTE — Patient Instructions (Signed)

## 2020-04-03 NOTE — Progress Notes (Signed)
Subjective:   Patient ID: Debbie Hayes, female   DOB: 69 y.o.   MRN: [id number]   HPI Patient presents stating she is developed some inflammation pain around her first joint left foot and on her right she has a painful ingrown toenail that is making it hard for her to wear shoe gear comfortably with and states that it is been ongoing.  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  For the left I did do sterile prep and injected the joint surface 3 mg Dexasone Kenalog 5 mg Xylocaine and for the right I recommended ingrown toenail corrected and I allowed her to read the consent form concerning correction and patient wants procedure understanding risk.  Today I infiltrated the right hallux 60 mg like Marcaine mixture sterile prep done and using sterile instrumentation remove the border exposed matrix applied phenol 3 applications 30 seconds followed by alcohol lavage and sterile dressing.  Patient is encouraged to leave dressing on 24 hours but take it off earlier if any throbbing were to occur and is encouraged to call with questions

## 2020-04-04 DIAGNOSIS — Z1231 Encounter for screening mammogram for malignant neoplasm of breast: Secondary | ICD-10-CM | POA: Diagnosis not present

## 2020-04-04 DIAGNOSIS — M85852 Other specified disorders of bone density and structure, left thigh: Secondary | ICD-10-CM | POA: Diagnosis not present

## 2020-04-04 DIAGNOSIS — M810 Age-related osteoporosis without current pathological fracture: Secondary | ICD-10-CM | POA: Diagnosis not present

## 2020-04-04 LAB — HM MAMMOGRAPHY

## 2020-04-05 ENCOUNTER — Encounter: Payer: Self-pay | Admitting: Family Medicine

## 2020-04-05 ENCOUNTER — Other Ambulatory Visit: Payer: Self-pay | Admitting: Family Medicine

## 2020-04-05 DIAGNOSIS — M810 Age-related osteoporosis without current pathological fracture: Secondary | ICD-10-CM | POA: Insufficient documentation

## 2020-04-09 ENCOUNTER — Encounter: Payer: Self-pay | Admitting: Family Medicine

## 2020-04-10 MED FILL — MONTELUKAST SOD 10 MG TAB: 10 | 90 days supply | Qty: 90 | Fill #2

## 2020-04-16 ENCOUNTER — Ambulatory Visit: Payer: 59 | Admitting: Family Medicine

## 2020-04-16 DIAGNOSIS — M65332 Trigger finger, left middle finger: Secondary | ICD-10-CM | POA: Diagnosis not present

## 2020-04-16 DIAGNOSIS — Z4789 Encounter for other orthopedic aftercare: Secondary | ICD-10-CM | POA: Diagnosis not present

## 2020-04-16 NOTE — Progress Notes (Deleted)
[place] at [facility] [address], [place], [place] [address] [phone]  Date:  04/18/2020   Name:  Debbie Hayes   DOB:  24-Jun-1951   MRN:  [id number]  PCP:  Darreld Mclean, MD    Chief Complaint: No chief complaint on file.   History of Present Illness:  Debbie Hayes is a 69 y.o. 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  44 adult children-1 daughter has Down syndrome, lives independently She has 6 grandchildren, very sadly her seventh grandchild was stillborn around the holidays Last seen by myself in March of this year I recently received her bone density scan, it did show osteoporosis  Eye exam Flu shot Update A1c today Covid series complete Shingrix? Can offer routine blood work today  Lab Results  Component Value Date   HGBA1C 7.6 (H) 10/19/2019   Farxiga Januvia Levothyroxine 125 Losartan Metoprolol Singulair Topamax ER Trazodone Ambien Oxycodone PRN Patient Active Problem List   Diagnosis Date Noted   Osteoporosis 04/05/2020   Trigger finger, left middle finger 07/27/2019   Chronic neck pain 11/10/2018   Orthopedic hardware present 02/17/2018   Diabetes mellitus due to underlying condition with complication ([place]) 29/52/8413   Intractable chronic migraine without aura and with status migrainosus 12/15/2017   Arthralgia 12/15/2017   Joint pain 12/15/2017   Migraine, transformed 12/15/2017   Refractory migraine without aura 12/15/2017   Pain of left shoulder joint on movement 10/19/2017   OSA (obstructive sleep apnea) 06/26/2017   Snoring 03/25/2016   Palpitations 03/25/2016   Headache 03/25/2016   Sensory disorder of  trigeminal nerve 11/26/2015   Spondylolisthesis of lumbar region 08/09/2014   Lumbar adjacent segment disease with spondylolisthesis 08/09/2014   Back pain 02/17/2014   Macular degeneration 02/17/2014   Backache 02/17/2014   Other and unspecified hyperlipidemia 10/14/2013   Hyperlipidemia 10/14/2013   Onychomycosis 12/01/2012   Arthritis 04/12/2012   Hypothyroid 04/12/2012   Hypothyroidism 04/12/2012    Past Medical History:  Diagnosis Date   Allergy    Cancer ([place])    thyroid cancer   Cataract    Chronic migraine BOTOX INJECTION EVERY 3 MONTHS   Chronic pain in right shoulder    Constipation    Diabetes mellitus    Disorder of inner ear CAUSES VERTIGO OCCASIONALLY   Fibromyalgia    GERD (gastroesophageal reflux disease)    Hemorrhoid    History of bladder infections    History of thyroid cancer 2009  S/P TOTAL THYROIDECTOMY AND RADIATION   Hyperlipidemia    Hypertension CARDIOLOGIST- DR Wynonia Lawman- WILL REQUEST LATE NOTE   DENIES S & S   Hypothyroidism    Insomnia    Left shoulder pain    Macular degeneration    MRSA infection    nasal treated more than 15 years ago   Normal nuclear stress test 01-25-2009   OA (osteoarthritis) JOINTS   Osteopenia    Painful orthopaedic hardware ([place])    left foot   PONV (postoperative nausea and vomiting) SEVERE   Rotator cuff disorder LEFT SHOULDER RTC IMPINGMENT   Sleep apnea    mouth guard, no cpap    Sleep apnea in adult 06/26/2017   Spondylolisthesis of lumbar region  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   Wears glasses     Past Surgical History:  Procedure Laterality Date   BACK SURGERY     Dr Arnoldo Morale- spondylosis   BILATERAL CARPAL TUNNEL RELEASE  1994   BILATERAL ELBOW SURG.  [place]   POST-OP URETER REPAIR 12 DAYS AFTER    CATARACT EXTRACTION, BILATERAL     COLONOSCOPY     EYE SURGERY     FOOT ARTHRODESIS Left 07/10/2016    Procedure: LEFT 2ND TARSAL METATARSAL ARTHRODESIS  GASTROC RECESSION LEFT [place]IDUS MODIFIED MCBRIDE BUIONECTOMY;  Surgeon: Wylene Simmer, MD;  Location: [place];  Service: Orthopedics;  Laterality: Left;   GASTROC RECESSION EXTREMITY Left 07/10/2016   Procedure: GASTROC RECESSION EXTREMITY;  Surgeon: Wylene Simmer, MD;  Location: [place];  Service: Orthopedics;  Laterality: Left;   HARDWARE REMOVAL Left 05/20/2018   Procedure: Left foot removal of deep implants;  Surgeon: Wylene Simmer, MD;  Location: [place];  Service: Orthopedics;  Laterality: Left;  97min   LEFT THUMB JOINT REPLACEMENT  2005   RIGHT DONE IN 2004   OOPHORECTOMY     POLYPECTOMY     RIGHT KNEE ARTHROSCOPY  X2  BEFORE 2011   RIGHT KNEE ARTHROSCOPY/ PARTIAL LATERAL MENISECTOMY/ TRICOMPARTMENT CHONDROPLASTY/ DECOMPRESSION CYST  10-26-2009   RIGHT KNEE CLOSED MANIPULATION  09-11-2010   RIGHT SHOULDER ARTHROSCOPY  2010  &  2004   TIBIA CYST REMOVED AND ORIF LEG [place]   TOTAL KNEE ARTHROPLASTY  07-16-2010   RIGHT   TOTAL THYROIDECTOMY  2009   CANCER  (POST-OP BLEED)  AND RADIATION [place]   trigger fingers Right 2013   3rd and 4th fingers   UPPER GASTROINTESTINAL ENDOSCOPY     VAGINAL HYSTERECTOMY  1990    Social History   Tobacco Use   Smoking status: Never Smoker   Smokeless tobacco: Never Used  Vaping Use   Vaping Use: Never used  Substance Use Topics   Alcohol use: Yes    Comment: RARE   Drug use: No    Family History  Problem Relation Age of Onset   Colon polyps Sister 38   Alzheimer's disease Mother    Heart disease Father    Cirrhosis Father    Heart attack Father    Macular degeneration Father    Colon cancer Neg Hx     Allergies  Allergen Reactions   Statins Other (See Comments)    Pt has tried multiple and cannot tolerate   Sulfa Antibiotics Hives    Medication list has been reviewed and  updated.  Current Outpatient Medications on File Prior to Visit  Medication Sig Dispense Refill   albuterol (VENTOLIN HFA) 108 (90 Base) MCG/ACT inhaler Inhale 2 puffs into the lungs every 6 (six) hours as needed for wheezing or shortness of breath. 18 g 6   blood glucose meter kit and supplies KIT Test blood sugar once daily. Dx code: E11.9 1 each 0   Blood Glucose Monitoring Suppl (FREESTYLE LITE) DEVI Check blood sugar no more than twice daily 1 each 0   esomeprazole (NEXIUM) 40 MG capsule Take 40 mg by mouth daily as needed (acid reflux).      FARXIGA 10 MG TABS tablet TAKE 1 TABLET BY MOUTH ONCE A DAY 90 tablet 3   glucose blood (FREESTYLE LITE) test strip CHECK BLOOD SUGARS NO MORE THAN TWICE DAILY 100 each  12   JANUVIA 100 MG tablet TAKE 1 TABLET BY MOUTH DAILY. 30 tablet 3   Lancets (FREESTYLE) lancets Check blood sugars no more than twice daily 100 each 12   levothyroxine (SYNTHROID) 125 MCG tablet Take 1 tablet (125 mcg total) by mouth daily before breakfast. 90 tablet 3   losartan (COZAAR) 50 MG tablet TAKE 1 TABLET (50 MG TOTAL) BY MOUTH DAILY. *PT NEED APPT* 90 tablet 0   metoprolol succinate (TOPROL-XL) 50 MG 24 hr tablet TAKE 1 TABLET (50 MG TOTAL) BY MOUTH DAILY. TAKE WITH OR IMMEDIATELY FOLLOWING A MEAL. 90 tablet 3   montelukast (SINGULAIR) 10 MG tablet Take 1 tablet (10 mg total) by mouth at bedtime. 90 tablet 3   Multiple Vitamins-Minerals (ICAPS AREDS 2) CAPS Take 1 capsule by mouth 2 (two) times daily.  (Patient not taking: Reported on 02/14/2020)     NASONEX 50 MCG/ACT nasal spray PLACE 2 SPRAYS INTO THE NOSE DAILY. (Patient taking differently: Place 2 sprays into the nose daily. ) 17 g 9   neomycin-polymyxin-hydrocortisone (CORTISPORIN) OTIC solution Place 3 drops into the right ear 4 (four) times daily. 10 mL 0   nitrofurantoin (MACRODANTIN) 50 MG capsule Take 1 capsule (50 mg total) by mouth at bedtime. 90 capsule 3   ondansetron (ZOFRAN) 4 MG tablet TAKE  1 TABLET BY MOUTH EVERY 8 HOURS AS NEEDED FOR NAUSEA AND VOMITING 30 tablet 1   OXAYDO 7.5 MG TABS TAKE 1 TABLET BY MOUTH EVERY 8 HOURS AS NEEDED 42 tablet 0   Pseudoephedrine-DM-GG (ROBITUSSIN CF PO) Take 2 capsules by mouth daily.     topiramate ER (QUDEXY XR) 100 MG CS24 sprinkle capsule Take 1 capsule (100 mg total) by mouth at bedtime. Appointment needed for further refills. 30 capsule 0   traZODone (DESYREL) 100 MG tablet TAKE 2.5 TABLETS BY MOUTH AT BEDTIME. 225 tablet 3   UBRELVY 50 MG TABS TAKE 1 TABLET (50 MG) BY MOUTH EVERY 2 HOURS AS NEEDED. MAX 200MG A DAY 10 tablet 5   zolpidem (AMBIEN) 5 MG tablet TAKE 1/2 TABLET BY MOUTH AT BEDTIME AS NEEDED FOR SLEEP 30 tablet 1   No current facility-administered medications on file prior to visit.    Review of Systems:  As per HPI- otherwise negative.   Physical Examination: There were no vitals filed for this visit. There were no vitals filed for this visit. There is no height or weight on file to calculate BMI. Ideal Body Weight:    GEN: no acute distress. HEENT: Atraumatic, Normocephalic.  Ears and Nose: No external deformity. CV: RRR, No M/G/R. No JVD. No thrill. No extra heart sounds. PULM: CTA B, no wheezes, crackles, rhonchi. No retractions. No resp. distress. No accessory muscle use. ABD: S, NT, ND, +BS. No rebound. No HSM. EXTR: No c/c/e PSYCH: Normally interactive. Conversant.    Assessment and Plan: *** This visit occurred during the SARS-CoV-2 public health emergency.  Safety protocols were in place, including screening questions prior to the visit, additional usage of staff PPE, and extensive cleaning of exam room while observing appropriate contact time as indicated for disinfecting solutions.    Signed Lamar Blinks, MD

## 2020-04-18 ENCOUNTER — Ambulatory Visit: Payer: 59 | Admitting: Family Medicine

## 2020-04-20 ENCOUNTER — Other Ambulatory Visit (HOSPITAL_COMMUNITY): Payer: Self-pay

## 2020-04-21 ENCOUNTER — Telehealth: Payer: Self-pay | Admitting: Unknown Physician Specialty

## 2020-04-21 NOTE — Telephone Encounter (Signed)
I connected by phone with Debbie Hayes on 04/21/2020 at 10:55 AM to discuss the potential use of a new treatment for mild to moderate COVID-19 viral infection in non-hospitalized patients.  This patient is a 69 y.o. female that meets the FDA criteria for Emergency Use Authorization of COVID monoclonal antibody casirivimab/imdevimab or bamlanivimab/eteseviamb.  Has a (+) direct SARS-CoV-2 viral test result  Has mild or moderate COVID-19   Is NOT hospitalized due to COVID-19  Is within 10 days of symptom onset  Has at least one of the high risk factor(s) for progression to severe COVID-19 and/or hospitalization as defined in EUA.  Specific high risk criteria : Older age (>/= 71 yo) and BMI > 25   I have spoken and communicated the following to the patient or parent/caregiver regarding COVID monoclonal antibody treatment:  1. FDA has authorized the emergency use for the treatment of mild to moderate COVID-19 in adults and pediatric patients with positive results of direct SARS-CoV-2 viral testing who are 8 years of age and older weighing at least 40 kg, and who are at high risk for progressing to severe COVID-19 and/or hospitalization.  2. The significant known and potential risks and benefits of COVID monoclonal antibody, and the extent to which such potential risks and benefits are unknown.  3. Information on available alternative treatments and the risks and benefits of those alternatives, including clinical trials.  4. Patients treated with COVID monoclonal antibody should continue to self-isolate and use infection control measures (e.g., wear mask, isolate, social distance, avoid sharing personal items, clean and disinfect "high touch" surfaces, and frequent handwashing) according to CDC guidelines.   5. The patient or parent/caregiver has the option to accept or refuse COVID monoclonal antibody treatment.  After reviewing this information with the patient, the patient has agreed  to receive one of the available covid 19 monoclonal antibodies and will be provided an appropriate fact sheet prior to infusion. Debbie Haddock, NP 04/21/2020 10:55 AM  Sx onset 9/22

## 2020-04-23 ENCOUNTER — Encounter: Payer: Self-pay | Admitting: Family Medicine

## 2020-04-23 ENCOUNTER — Other Ambulatory Visit: Payer: Self-pay | Admitting: Nurse Practitioner

## 2020-04-23 ENCOUNTER — Ambulatory Visit (HOSPITAL_COMMUNITY)
Admission: RE | Admit: 2020-04-23 | Discharge: 2020-04-23 | Disposition: A | Discharge status: Home / Self Care | Payer: 59 | Source: Ambulatory Visit | Attending: Pulmonary Disease | Admitting: Pulmonary Disease

## 2020-04-23 DIAGNOSIS — E088 Diabetes mellitus due to underlying condition with unspecified complications: Secondary | ICD-10-CM | POA: Insufficient documentation

## 2020-04-23 DIAGNOSIS — E785 Hyperlipidemia, unspecified: Secondary | ICD-10-CM | POA: Insufficient documentation

## 2020-04-23 DIAGNOSIS — U071 COVID-19: Secondary | ICD-10-CM

## 2020-04-23 DIAGNOSIS — E039 Hypothyroidism, unspecified: Secondary | ICD-10-CM

## 2020-04-23 DIAGNOSIS — G4733 Obstructive sleep apnea (adult) (pediatric): Secondary | ICD-10-CM | POA: Insufficient documentation

## 2020-04-23 DIAGNOSIS — I10 Essential (primary) hypertension: Secondary | ICD-10-CM

## 2020-04-23 MED ORDER — SODIUM CHLORIDE 0.9 % IV SOLN
1200.0000 mg | Freq: Once | INTRAVENOUS | Status: AC
  Administered 2020-04-23: 1200 mg via INTRAVENOUS

## 2020-04-23 MED ORDER — DIPHENHYDRAMINE HCL 50 MG/ML IJ SOLN: 50.0000 mg | Freq: Once | INTRAMUSCULAR | Status: DC | PRN

## 2020-04-23 MED ORDER — FAMOTIDINE IN NACL 20-0.9 MG/50ML-% IV SOLN: 20.0000 mg | Freq: Once | INTRAVENOUS | Status: DC | PRN

## 2020-04-23 MED ORDER — ALBUTEROL SULFATE HFA 108 (90 BASE) MCG/ACT IN AERS: 2.0000 | INHALATION_SPRAY | Freq: Once | RESPIRATORY_TRACT | Status: DC | PRN

## 2020-04-23 MED ORDER — EPINEPHRINE 0.3 MG/0.3ML IJ SOAJ: 0.3000 mg | Freq: Once | INTRAMUSCULAR | Status: DC | PRN

## 2020-04-23 MED ORDER — METHYLPREDNISOLONE SODIUM SUCC 125 MG IJ SOLR: 125.0000 mg | Freq: Once | INTRAMUSCULAR | Status: DC | PRN

## 2020-04-23 MED ORDER — SODIUM CHLORIDE 0.9 % IV SOLN: INTRAVENOUS | Status: DC | PRN

## 2020-04-23 NOTE — Progress Notes (Signed)
  Diagnosis: COVID-19  Physician: Dr. Wright  Procedure: Cov[facility] Infusion [facility] Med: casirivimab\imdevimab infusion - Provided patient with casirivimab\imdevimab fact sheet for patients, parents and caregivers prior to infusion.  Complications: No immediate complications noted.  Discharge: Discharged home   Debbie Hayes 04/23/2020   

## 2020-04-23 NOTE — Discharge Instructions (Signed)

## 2020-04-24 ENCOUNTER — Telehealth: Payer: Self-pay | Admitting: Family Medicine

## 2020-04-24 DIAGNOSIS — R509 Fever, unspecified: Secondary | ICD-10-CM

## 2020-04-24 MED ORDER — CEFDINIR 300 MG PO CAPS: 300.0000 mg | ORAL_CAPSULE | Freq: Two times a day (BID) | ORAL | 0 refills | Status: DC

## 2020-04-24 NOTE — Telephone Encounter (Signed)
Spoke with Debbie Hayes and her husband, Dr Lorette Ang was dx with covid last week.  She has been generally mildly ill, had infusion yesterday. Then last night she had a mild temp to about 101, has been coughing more and Dr Linna Darner auscultated rales in her lung bases.  We discussed getting a chest film, but this is more difficult as she has active covid 19- outpatient imaging will not take her We decided to treat her with antibiotics to cover any bacterial superinfection and follow-up closely.    Meds ordered this encounter  Medications   cefdinir (OMNICEF) 300 MG capsule    Sig: Take 1 capsule (300 mg total) by mouth 2 (two) times daily.    Dispense:  20 capsule    Refill:  0

## 2020-04-25 ENCOUNTER — Other Ambulatory Visit: Payer: Self-pay | Admitting: Family Medicine

## 2020-04-25 DIAGNOSIS — E119 Type 2 diabetes mellitus without complications: Secondary | ICD-10-CM

## 2020-04-25 MED FILL — JANUVIA 100 MG TABLET: 100 | 30 days supply | Qty: 30 | Fill #0

## 2020-04-30 NOTE — Progress Notes (Addendum)
[facility] [place] [address], [address] [place], [place] [address] [phone] Fax [phone]  Date:  05/02/2020   Name:  Debbie Hayes   DOB:  10/22/1950   MRN:  [id number]  PCP:  Copland, Jessica C, MD    Chief Complaint: Leg Pain (right leg, started 2 months ago, no injury, radiating from pelvic reion to ankle)   History of Present Illness:  Debbie Hayes is a 68 y.o. very pleasant female patient who presents with the following:  Patient with history of migraine headache, sleep apnea, diabetes, hypothyroidism, hyperlipidemia She recently had a breakthrough case of COVID-19, thankfully has recovered well She was diagnosed on September 22, contacted me on the 27th due to rhonchi in her chest and a fever.  We called in Omnicef for her  Here today with concern of pain in her leg, and also to catch up on health maintenance She is just finishing up her omnicef at this time-has been 2 days to go Last fever was on 9/27  They are going to [place] for a family trip in November - they will visit her daughter Betsy at thattime For about 2 months she has noted pain from the right buttock down to the ankle. Will occur especially with sitting in the car-the patient is to start after several hours in the car, 9-minute short trip can trigger the pain Sitting in a hard chair also causes the pain; she is okay in a softer chair Symptoms are not present on the left side No numbness or weakness, no bowel or bladder control change No back pain  Eye exam- this was done in May of 2021 Flu vaccine-after discussion, decided to give today  Lab Results  Component Value Date   HGBA1C 7.6 (H) 10/19/2019   Can update A1c, routine lab work today  Patient Active Problem List   Diagnosis Date Noted  . Osteoporosis 04/05/2020  . Trigger finger, left middle finger 07/27/2019  . Chronic neck pain 11/10/2018  . Orthopedic hardware present 02/17/2018  . Diabetes  mellitus due to underlying condition with complication (HCC) 12/15/2017  . 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 Arthralgia 12/15/2017  . Joint pain 12/15/2017  . Migraine, transformed 12/15/2017  . 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 Pain of left shoulder joint on movement 10/19/2017  . OSA (obstructive sleep apnea) 06/26/2017  . Snoring 03/25/2016  . Palpitations 03/25/2016  . Headache 03/25/2016  . Sensory disorder of trigeminal nerve 11/26/2015  . Spondylolisthesis of lumbar region 08/09/2014  . Lumbar adjacent segment disease with spondylolisthesis 08/09/2014  . Back pain 02/17/2014  . Macular degeneration 02/17/2014  . Backache 02/17/2014  . Other and unspecified hyperlipidemia 10/14/2013  . Hyperlipidemia 10/14/2013  . Onychomycosis 12/01/2012  . Arthritis 04/12/2012  . Hypothyroidism 04/12/2012    Past Medical History:  Diagnosis Date  . Allergy   . Cancer (HCC)    thyroid cancer  . Cataract   . Chronic migraine BOTOX INJECTION EVERY 3 MONTHS  . Chronic pain in right shoulder   . Constipation   . Diabetes mellitus   . Disorder of inner ear CAUSES VERTIGO OCCASIONALLY  . Fibromyalgia   . GERD (gastroesophageal reflux disease)   . Hemorrhoid   . History of bladder infections   . History of thyroid cancer 2009  S/P TOTAL THYROIDECTOMY AND RADIATION  . Hyperlipidemia   . Hypertension CARDIOLOGIST- DR TILLEY- WILL REQUEST LATE   NOTE   DENIES S & S  . Hypothyroidism   . Insomnia   . Left shoulder pain   . Macular degeneration   . MRSA infection    nasal treated more than 15 years ago  . Normal nuclear stress test 01-25-2009  . OA (osteoarthritis) JOINTS  . Osteopenia   . Painful orthopaedic hardware (HCC)    left foot  . PONV (postoperative nausea and vomiting) SEVERE  . Rotator cuff disorder LEFT SHOULDER RTC IMPINGMENT  . Sleep apnea    mouth guard, no cpap   . Sleep apnea in adult 06/26/2017  .  Spondylolisthesis of lumbar region   . 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 Wears glasses     Past Surgical History:  Procedure Laterality Date  . BACK SURGERY     Dr Jenkins- spondylosis  . BILATERAL CARPAL TUNNEL RELEASE  1994  . BILATERAL ELBOW SURG.  1999  . BILATERAL SALPINGOOPHECTOMY  1993   POST-OP URETER REPAIR 12 DAYS AFTER   . CATARACT EXTRACTION, BILATERAL    . COLONOSCOPY    . EYE SURGERY    . FOOT ARTHRODESIS Left 07/10/2016   Procedure: LEFT 2ND TARSAL METATARSAL ARTHRODESIS  GASTROC RECESSION LEFT LAPIDUS MODIFIED MCBRIDE BUIONECTOMY;  Surgeon: John Hewitt, MD;  Location: [facility];  Service: Orthopedics;  Laterality: Left;  . GASTROC RECESSION EXTREMITY Left 07/10/2016   Procedure: GASTROC RECESSION EXTREMITY;  Surgeon: John Hewitt, MD;  Location: [facility];  Service: Orthopedics;  Laterality: Left;  . HARDWARE REMOVAL Left 05/20/2018   Procedure: Left foot removal of deep implants;  Surgeon: Hewitt, John, MD;  Location: [facility];  Service: Orthopedics;  Laterality: Left;  60min  . LEFT THUMB JOINT REPLACEMENT  2005   RIGHT DONE IN 2004  . OOPHORECTOMY    . POLYPECTOMY    . RIGHT KNEE ARTHROSCOPY  X2  BEFORE 2011  . RIGHT KNEE ARTHROSCOPY/ PARTIAL LATERAL MENISECTOMY/ TRICOMPARTMENT CHONDROPLASTY/ DECOMPRESSION CYST  10-26-2009  . RIGHT KNEE CLOSED MANIPULATION  09-11-2010  . RIGHT SHOULDER ARTHROSCOPY  2010  &  2004  . TIBIA CYST REMOVED AND ORIF LEG FX  1958  . TONSILLECTOMY  1968  . TOTAL KNEE ARTHROPLASTY  07-16-2010   RIGHT  . TOTAL THYROIDECTOMY  2009   CANCER  (POST-OP BLEED)  AND RADIATION TX  . trigger fingers Right 2013   3rd and 4th fingers  . UPPER GASTROINTESTINAL ENDOSCOPY    . VAGINAL HYSTERECTOMY  1990    Social History   Tobacco Use  . Smoking status: Never Smoker  . Smokeless tobacco: Never Used  Vaping Use  . Vaping Use: Never used  Substance Use Topics  . Alcohol use: Yes     Comment: RARE  . Drug use: No    Family History  Problem Relation Age of Onset  . Colon polyps Sister 50  . Alzheimer's disease Mother   . Heart disease Father   . Cirrhosis Father   . Heart attack Father   . Macular degeneration Father   . Colon cancer Neg Hx     Allergies  Allergen Reactions  . Statins Other (See Comments)    Pt has tried multiple and cannot tolerate  . Sulfa Antibiotics Hives    Medication list has been reviewed and updated.  Current Outpatient Medications on File Prior to Visit  Medication Sig Dispense Refill  . albuterol (VENTOLIN HFA) 108 (90 Base) MCG/ACT inhaler Inhale 2 puffs into the   lungs every 6 (six) hours as needed for wheezing or shortness of breath. 18 g 6  . blood glucose meter kit and supplies KIT Test blood sugar once daily. Dx code: E11.9 1 each 0  . Blood Glucose Monitoring Suppl (FREESTYLE LITE) DEVI Check blood sugar no more than twice daily 1 each 0  . esomeprazole (NEXIUM) 40 MG capsule Take 40 mg by mouth daily as needed (acid reflux).     . FARXIGA 10 MG TABS tablet TAKE 1 TABLET BY MOUTH ONCE A DAY 90 tablet 3  . glucose blood (FREESTYLE LITE) test strip CHECK BLOOD SUGARS NO MORE THAN TWICE DAILY 100 each 12  . JANUVIA 100 MG tablet TAKE 1 TABLET BY MOUTH DAILY. 30 tablet 3  . Lancets (FREESTYLE) lancets Check blood sugars no more than twice daily 100 each 12  . levothyroxine (SYNTHROID) 125 MCG tablet Take 1 tablet (125 mcg total) by mouth daily before breakfast. 90 tablet 3  . losartan (COZAAR) 50 MG tablet TAKE 1 TABLET (50 MG TOTAL) BY MOUTH DAILY. *PT NEED APPT* 90 tablet 0  . metoprolol succinate (TOPROL-XL) 50 MG 24 hr tablet TAKE 1 TABLET (50 MG TOTAL) BY MOUTH DAILY. TAKE WITH OR IMMEDIATELY FOLLOWING A MEAL. 90 tablet 3  . montelukast (SINGULAIR) 10 MG tablet Take 1 tablet (10 mg total) by mouth at bedtime. 90 tablet 3  . NASONEX 50 MCG/ACT nasal spray PLACE 2 SPRAYS INTO THE NOSE DAILY. (Patient taking differently: Place 2  sprays into the nose daily. ) 17 g 9  . nitrofurantoin (MACRODANTIN) 50 MG capsule Take 1 capsule (50 mg total) by mouth at bedtime. 90 capsule 3  . ondansetron (ZOFRAN) 4 MG tablet TAKE 1 TABLET BY MOUTH EVERY 8 HOURS AS NEEDED FOR NAUSEA AND VOMITING 30 tablet 1  . Pseudoephedrine-DM-GG (ROBITUSSIN CF PO) Take 2 capsules by mouth daily.    . topiramate ER (QUDEXY XR) 100 MG CS24 sprinkle capsule Take 1 capsule (100 mg total) by mouth at bedtime. MUST BE SEEN FOR FURTHER REFILLS. CALL [phone]. 30 capsule 0  . traZODone (DESYREL) 100 MG tablet TAKE 2.5 TABLETS BY MOUTH AT BEDTIME. 225 tablet 3  . UBRELVY 50 MG TABS TAKE 1 TABLET (50 MG) BY MOUTH EVERY 2 HOURS AS NEEDED. MAX 200MG A DAY 10 tablet 5  . zolpidem (AMBIEN) 5 MG tablet TAKE 1/2 TABLET BY MOUTH AT BEDTIME AS NEEDED FOR SLEEP 30 tablet 1   No current facility-administered medications on file prior to visit.    Review of Systems:  As per HPI- otherwise negative.   Physical Examination: Vitals:   05/02/20 1104  BP: 118/62  Pulse: 78  Resp: 16  SpO2: 98%   Vitals:   05/02/20 1104  Weight: 186 lb (84.4 kg)  Height: 5' 8" (1.727 m)   Body mass index is 28.28 kg/m. Ideal Body Weight: Weight in (lb) to have BMI = 25: 164.1  GEN: no acute distress.  Overweight, looks well HEENT: Atraumatic, Normocephalic.  Ears and Nose: No external deformity. CV: RRR, No M/G/R. No JVD. No thrill. No extra heart sounds. PULM: CTA B, no wheezes, crackles, rhonchi. No retractions. No resp. distress. No accessory muscle use. EXTR: No c/c/e PSYCH: Normally interactive. Conversant.  Normal bilateral lower extremity strength, negative straight leg raise Patient has tenderness over the right sciatic notch/piriformis muscle   Assessment and Plan: Piriformis syndrome of right side  Hypothyroidism, unspecified type - Plan: TSH  Hyperlipidemia, unspecified hyperlipidemia type - Plan: Lipid panel    Essential hypertension - Plan: CBC,  Comprehensive metabolic panel  Diabetes mellitus due to underlying condition with complication (HCC) - Plan: Hemoglobin A1c  Encounter for vitamin deficiency screening - Plan: VITAMIN D 25 Hydroxy (Vit-D Deficiency, Fractures)  Cough - Plan: benzonatate (TESSALON) 200 MG capsule  Lumbar adjacent segment disease with spondylolisthesis - Plan: DISCONTINUED: oxyCODONE HCl (OXAYDO) 7.5 MG TABS  Needs flu shot - Plan: Flu Vaccine QUAD High Dose(Fluad)  Patient today with right leg pain most consistent with piriformis syndrome.  We discussed this together, I gave her handout with some exercises and stretches to try.  We will also do a short course of prednisone (I asked her to wait 2 to 3 days to start as we get her flu shot today).  If she is not able to get her symptoms under better control, plan refer to sports medicine Routine labs are pending as above Flu shot given today Tessalon Perles use as needed for cough Refilled oxycodone that she uses on occasion for back pain Will plan further follow- up pending labs.  This visit occurred during the SARS-CoV-2 public health emergency.  Safety protocols were in place, including screening questions prior to the visit, additional usage of staff PPE, and extensive cleaning of exam room while observing appropriate contact time as indicated for disinfecting solutions.    Signed Jessica Copland, MD  Received labs 10/7, message to patient  Results for orders placed or performed in visit on 05/02/20  CBC  Result Value Ref Range   WBC 6.7 3.8 - 10.8 Thousand/uL   RBC 5.32 (H) 3.80 - 5.10 Million/uL   Hemoglobin 14.4 11.7 - 15.5 g/dL   HCT 44.1 35 - 45 %   MCV 82.9 80.0 - 100.0 fL   MCH 27.1 27.0 - 33.0 pg   MCHC 32.7 32.0 - 36.0 g/dL   RDW 14.7 11.0 - 15.0 %   Platelets 250 140 - 400 Thousand/uL   MPV 10.3 7.5 - 12.5 fL  Comprehensive metabolic panel  Result Value Ref Range   Glucose, Bld 196 (H) 65 - 99 mg/dL   BUN 20 7 - 25 mg/dL   Creat  0.82 0.60 - 0.93 mg/dL   BUN/Creatinine Ratio NOT APPLICABLE 6 - 22 (calc)   Sodium 139 135 - 146 mmol/L   Potassium 4.2 3.5 - 5.3 mmol/L   Chloride 105 98 - 110 mmol/L   CO2 23 20 - 32 mmol/L   Calcium 9.5 8.6 - 10.4 mg/dL   Total Protein 7.0 6.1 - 8.1 g/dL   Albumin 4.2 3.6 - 5.1 g/dL   Globulin 2.8 1.9 - 3.7 g/dL (calc)   AG Ratio 1.5 1.0 - 2.5 (calc)   Total Bilirubin 0.3 0.2 - 1.2 mg/dL   Alkaline phosphatase (APISO) 76 37 - 153 U/L   AST 14 10 - 35 U/L   ALT 17 6 - 29 U/L  Hemoglobin A1c  Result Value Ref Range   Hgb A1c MFr Bld 8.5 (H) <5.7 % of total Hgb   Mean Plasma Glucose 197 (calc)   eAG (mmol/L) 10.9 (calc)  Lipid panel  Result Value Ref Range   Cholesterol 293 (H) <200 mg/dL   HDL 48 (L) > OR = 50 mg/dL   Triglycerides 193 (H) <150 mg/dL   LDL Cholesterol (Calc) 208 (H) mg/dL (calc)   Total CHOL/HDL Ratio 6.1 (H) <5.0 (calc)   Non-HDL Cholesterol (Calc) 245 (H) <130 mg/dL (calc)  TSH  Result Value Ref Range   TSH 4.35   0.40 - 4.50 mIU/L  VITAMIN D 25 Hydroxy (Vit-D Deficiency, Fractures)  Result Value Ref Range   Vit D, 25-Hydroxy 37 30 - 100 ng/mL     

## 2020-05-01 ENCOUNTER — Other Ambulatory Visit: Payer: Self-pay | Admitting: Neurology

## 2020-05-01 MED FILL — TOPIRAMATE ER 100 MG CAP: 100 | 30 days supply | Qty: 30 | Fill #0

## 2020-05-02 ENCOUNTER — Other Ambulatory Visit: Payer: Self-pay

## 2020-05-02 ENCOUNTER — Ambulatory Visit: Payer: 59 | Admitting: Family Medicine

## 2020-05-02 ENCOUNTER — Other Ambulatory Visit: Payer: Self-pay | Admitting: Family Medicine

## 2020-05-02 ENCOUNTER — Encounter: Payer: Self-pay | Admitting: Family Medicine

## 2020-05-02 VITALS — BP 118/62 | HR 78 | Resp 16 | Ht 68.0 in | Wt 186.0 lb

## 2020-05-02 DIAGNOSIS — I10 Essential (primary) hypertension: Secondary | ICD-10-CM | POA: Diagnosis not present

## 2020-05-02 DIAGNOSIS — R059 Cough, unspecified: Secondary | ICD-10-CM | POA: Diagnosis not present

## 2020-05-02 DIAGNOSIS — M5136 Other intervertebral disc degeneration, lumbar region: Secondary | ICD-10-CM | POA: Diagnosis not present

## 2020-05-02 DIAGNOSIS — E785 Hyperlipidemia, unspecified: Secondary | ICD-10-CM

## 2020-05-02 DIAGNOSIS — E088 Diabetes mellitus due to underlying condition with unspecified complications: Secondary | ICD-10-CM

## 2020-05-02 DIAGNOSIS — Z1321 Encounter for screening for nutritional disorder: Secondary | ICD-10-CM

## 2020-05-02 DIAGNOSIS — G5701 Lesion of sciatic nerve, right lower limb: Secondary | ICD-10-CM | POA: Diagnosis not present

## 2020-05-02 DIAGNOSIS — M4316 Spondylolisthesis, lumbar region: Secondary | ICD-10-CM

## 2020-05-02 DIAGNOSIS — Z23 Encounter for immunization: Secondary | ICD-10-CM | POA: Diagnosis not present

## 2020-05-02 DIAGNOSIS — E039 Hypothyroidism, unspecified: Secondary | ICD-10-CM

## 2020-05-02 MED ORDER — OXYCODONE HCL 5 MG PO TABS
5.0000 mg | ORAL_TABLET | Freq: Four times a day (QID) | ORAL | 0 refills | Status: DC | PRN
Start: 2020-05-02 — End: 2020-08-30

## 2020-05-02 MED ORDER — OXAYDO 7.5 MG PO TABS: 1.0000 | ORAL_TABLET | Freq: Three times a day (TID) | ORAL | 0 refills | Status: DC | PRN

## 2020-05-02 MED ORDER — PREDNISONE 20 MG PO TABS: ORAL_TABLET | ORAL | 0 refills | Status: DC

## 2020-05-02 MED ORDER — OXYCODONE-ACETAMINOPHEN 5-325 MG PO TABS
1.0000 | ORAL_TABLET | Freq: Three times a day (TID) | ORAL | 0 refills | Status: DC | PRN
Start: 2020-05-02 — End: 2020-05-02

## 2020-05-02 MED ORDER — BENZONATATE 200 MG PO CAPS: 200.0000 mg | ORAL_CAPSULE | Freq: Three times a day (TID) | ORAL | 2 refills | Status: DC | PRN

## 2020-05-02 MED FILL — BENZONATATE 200 MG CAPS: 200 | 20 days supply | Qty: 60 | Fill #0

## 2020-05-02 MED FILL — oxyCODONE HCL 5 MG TABS: 5 | 10 days supply | Qty: 60 | Fill #0

## 2020-05-02 MED FILL — predniSONE 20 MG TABS: 20 | 6 days supply | Qty: 9 | Fill #0

## 2020-05-02 NOTE — Patient Instructions (Signed)
It was good to see you today- please give my best to Shanon Brow and the rest of your family!   I think that you do have piriformis syndrome- please try some of the exercises/ stretches I gave you. We can also try a few days of steroids; allow 1-2 days until you start to give the flu shot time to work  If this is not helpful please message me and I can refer you to sports med  I will be in touch with your labs asap!

## 2020-05-03 ENCOUNTER — Encounter: Payer: Self-pay | Admitting: Family Medicine

## 2020-05-03 ENCOUNTER — Other Ambulatory Visit: Payer: Self-pay | Admitting: Family Medicine

## 2020-05-03 DIAGNOSIS — E785 Hyperlipidemia, unspecified: Secondary | ICD-10-CM

## 2020-05-03 LAB — TSH: TSH: 4.35 mIU/L (ref 0.40–4.50)

## 2020-05-03 LAB — COMPREHENSIVE METABOLIC PANEL
AG Ratio: 1.5 (calc) (ref 1.0–2.5)
ALT: 17 U/L (ref 6–29)
AST: 14 U/L (ref 10–35)
Albumin: 4.2 g/dL (ref 3.6–5.1)
Alkaline phosphatase (APISO): 76 U/L (ref 37–153)
BUN: 20 mg/dL (ref 7–25)
CO2: 23 mmol/L (ref 20–32)
Calcium: 9.5 mg/dL (ref 8.6–10.4)
Chloride: 105 mmol/L (ref 98–110)
Creat: 0.82 mg/dL (ref 0.60–0.93)
Globulin: 2.8 g/dL (calc) (ref 1.9–3.7)
Glucose, Bld: 196 mg/dL — ABNORMAL HIGH (ref 65–99)
Potassium: 4.2 mmol/L (ref 3.5–5.3)
Sodium: 139 mmol/L (ref 135–146)
Total Bilirubin: 0.3 mg/dL (ref 0.2–1.2)
Total Protein: 7 g/dL (ref 6.1–8.1)

## 2020-05-03 LAB — LIPID PANEL
Cholesterol: 293 mg/dL — ABNORMAL HIGH (ref <200)
HDL: 48 mg/dL — ABNORMAL LOW (ref >=50)
LDL Cholesterol (Calc): 208 mg/dL (calc) — ABNORMAL HIGH
Non-HDL Cholesterol (Calc): 245 mg/dL (calc) — ABNORMAL HIGH (ref <130)
Total CHOL/HDL Ratio: 6.1 (calc) — ABNORMAL HIGH (ref <5.0)
Triglycerides: 193 mg/dL — ABNORMAL HIGH (ref <150)

## 2020-05-03 LAB — CBC
HCT: 44.1 % (ref 35.0–45.0)
Hemoglobin: 14.4 g/dL (ref 11.7–15.5)
MCH: 27.1 pg (ref 27.0–33.0)
MCHC: 32.7 g/dL (ref 32.0–36.0)
MCV: 82.9 fL (ref 80.0–100.0)
MPV: 10.3 fL (ref 7.5–12.5)
Platelets: 250 10*3/uL (ref 140–400)
RBC: 5.32 10*6/uL — ABNORMAL HIGH (ref 3.80–5.10)
RDW: 14.7 % (ref 11.0–15.0)
WBC: 6.7 10*3/uL (ref 3.8–10.8)

## 2020-05-03 LAB — HEMOGLOBIN A1C
Hgb A1c MFr Bld: 8.5 % of total Hgb — ABNORMAL HIGH (ref <5.7)
Mean Plasma Glucose: 197 (calc)
eAG (mmol/L): 10.9 (calc)

## 2020-05-03 LAB — VITAMIN D 25 HYDROXY (VIT D DEFICIENCY, FRACTURES): Vit D, 25-Hydroxy: 37 ng/mL (ref 30–100)

## 2020-05-04 MED ORDER — PRALUENT 75 MG/ML ~~LOC~~ SOAJ: 75.0000 mg | SUBCUTANEOUS | 3 refills | Status: DC

## 2020-05-07 ENCOUNTER — Encounter: Payer: Self-pay | Admitting: Family Medicine

## 2020-05-08 ENCOUNTER — Other Ambulatory Visit: Payer: Self-pay | Admitting: Family Medicine

## 2020-05-08 NOTE — Telephone Encounter (Signed)
Last written:05/02/20 Last ov:05/02/20 Next RF:FMBW Contract: UDS:10/19/19

## 2020-05-09 NOTE — Telephone Encounter (Signed)
Thyroidectomy was back in 2009.   I cannot find pathology details from that time.  Called and left detailed message for Dr. Chalmers Cater who was her endocrinologist.  Asked staff to inquire about use of GLP-1 agonist for this patient, any contraindication?

## 2020-05-10 ENCOUNTER — Encounter: Payer: Self-pay | Admitting: Family Medicine

## 2020-05-10 DIAGNOSIS — G5701 Lesion of sciatic nerve, right lower limb: Secondary | ICD-10-CM

## 2020-05-10 DIAGNOSIS — E088 Diabetes mellitus due to underlying condition with unspecified complications: Secondary | ICD-10-CM

## 2020-05-11 MED FILL — LEVOTHYROXINE 125 MCG TABLE: 125 | 90 days supply | Qty: 90 | Fill #1

## 2020-05-14 ENCOUNTER — Other Ambulatory Visit: Payer: Self-pay | Admitting: Family Medicine

## 2020-05-14 MED ORDER — BYDUREON BCISE 2 MG/0.85ML ~~LOC~~ AUIJ: 2.0000 mg | AUTO-INJECTOR | SUBCUTANEOUS | 12 refills | Status: DC

## 2020-05-14 MED FILL — BYDUREON BCise 2 MG/0.85ML: 2 | 84 days supply | Qty: 10 | Fill #0

## 2020-05-15 ENCOUNTER — Telehealth: Payer: Self-pay

## 2020-05-15 ENCOUNTER — Encounter: Payer: Self-pay | Admitting: Family Medicine

## 2020-05-15 MED ORDER — CEFDINIR 300 MG PO CAPS
300.0000 mg | ORAL_CAPSULE | Freq: Two times a day (BID) | ORAL | 0 refills | Status: DC
Start: 2020-05-15 — End: 2020-05-27

## 2020-05-15 MED ORDER — CEFDINIR 300 MG PO CAPS: 300.0000 mg | ORAL_CAPSULE | Freq: Two times a day (BID) | ORAL | 0 refills | Status: DC

## 2020-05-15 MED FILL — traZODone HCL 100 MG TABS: 100 | 90 days supply | Qty: 225 | Fill #1

## 2020-05-15 NOTE — Telephone Encounter (Signed)
PA initiated via Covermymeds; KEY: [id number]. Awaiting determination.

## 2020-05-15 NOTE — Addendum Note (Signed)
Addended by: Lamar Blinks C on: 05/15/2020 07:54 PM   Modules accepted: Orders

## 2020-05-17 ENCOUNTER — Ambulatory Visit (INDEPENDENT_AMBULATORY_CARE_PROVIDER_SITE_OTHER): Payer: 59

## 2020-05-17 ENCOUNTER — Ambulatory Visit (INDEPENDENT_AMBULATORY_CARE_PROVIDER_SITE_OTHER): Payer: 59 | Admitting: Family Medicine

## 2020-05-17 ENCOUNTER — Encounter: Payer: Self-pay | Admitting: Family Medicine

## 2020-05-17 ENCOUNTER — Other Ambulatory Visit: Payer: Self-pay

## 2020-05-17 VITALS — BP 110/70 | HR 79 | Ht 68.0 in | Wt 186.0 lb

## 2020-05-17 DIAGNOSIS — M545 Low back pain, unspecified: Secondary | ICD-10-CM

## 2020-05-17 DIAGNOSIS — M5136 Other intervertebral disc degeneration, lumbar region: Secondary | ICD-10-CM | POA: Diagnosis not present

## 2020-05-17 DIAGNOSIS — M4316 Spondylolisthesis, lumbar region: Secondary | ICD-10-CM

## 2020-05-17 MED ORDER — GABAPENTIN 100 MG PO CAPS: ORAL_CAPSULE | ORAL | 1 refills | Status: DC

## 2020-05-17 MED FILL — GABAPENTIN 100 MG CAPSULE: 100 | 45 days supply | Qty: 90 | Fill #0

## 2020-05-17 NOTE — Progress Notes (Signed)
[facility] Phone: [phone] Subjective:   Fontaine No, am serving as a scribe for Dr. Hulan Saas. This visit occurred during the SARS-CoV-2 public health emergency.  Safety protocols were in place, including screening questions prior to the visit, additional usage of staff PPE, and extensive cleaning of exam room while observing appropriate contact time as indicated for disinfecting solutions.  I'm seeing this patient by the request  of:  Copland, Gay Filler, MD  CC: right hip pain   [id number]  LARIN DEPAOLI is a 69 y.o. female coming in with complaint of piriformis syndrome. Pain is sharp that radiates down to her right foot. Patient has history of 2 back surgeries. Last one was October 2020 by Dr. Arnoldo Morale. Patient adjusts her posture to alleviate pain. Patient has a hard time riding in car to [place]. Has to use oxycodone, Tylenol and Aleve to make trip. Did try course of prednisone which did not help her pain.    Lumbar xray 2020 IMPRESSION: Intraoperative images during L3-L5 posterior fusion.no losening noted mild OA and facet at L5/S1, independently visualized by me today  Past Medical History:  Diagnosis Date  . Allergy   . Cancer [facility])    thyroid cancer  . Cataract   . Chronic migraine BOTOX INJECTION EVERY 3 MONTHS  . Chronic pain in right shoulder   . Constipation   . Diabetes mellitus   . Disorder of inner ear CAUSES VERTIGO OCCASIONALLY  . Fibromyalgia   . GERD (gastroesophageal reflux disease)   . Hemorrhoid   . History of bladder infections   . History of thyroid cancer 2009  S/P TOTAL THYROIDECTOMY AND RADIATION  . Hyperlipidemia   . Hypertension CARDIOLOGIST- DR Wynonia Lawman- WILL REQUEST LATE NOTE   DENIES S & S  . Hypothyroidism   . Insomnia   . Left shoulder pain   . Macular degeneration   . MRSA infection    nasal treated more than 15 years ago  . Normal nuclear stress test  01-25-2009  . OA (osteoarthritis) JOINTS  . Osteopenia   . Painful orthopaedic hardware ([place])    left foot  . PONV (postoperative nausea and vomiting) SEVERE  . Rotator cuff disorder LEFT SHOULDER RTC IMPINGMENT  . Sleep apnea    mouth guard, no cpap   . Sleep apnea in adult 06/26/2017  . Spondylolisthesis of lumbar region   . 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 Wears glasses    Past Surgical History:  Procedure Laterality Date  . BACK SURGERY     Dr Arnoldo Morale- spondylosis  . BILATERAL CARPAL TUNNEL RELEASE  1994  . BILATERAL ELBOW SURG.  1999  . BILATERAL SALPINGOOPHECTOMY  1993   POST-OP URETER REPAIR 12 DAYS AFTER   . CATARACT EXTRACTION, BILATERAL    . COLONOSCOPY    . EYE SURGERY    . FOOT ARTHRODESIS Left 07/10/2016   Procedure: LEFT 2ND TARSAL METATARSAL ARTHRODESIS  GASTROC RECESSION LEFT LAPIDUS MODIFIED MCBRIDE BUIONECTOMY;  Surgeon: Wylene Simmer, MD;  Location: [place];  Service: Orthopedics;  Laterality: Left;  Marland Kitchen GASTROC RECESSION EXTREMITY Left 07/10/2016   Procedure: GASTROC RECESSION EXTREMITY;  Surgeon: Wylene Simmer, MD;  Location: [place];  Service: Orthopedics;  Laterality: Left;  . HARDWARE REMOVAL Left 05/20/2018   Procedure: Left foot removal of deep implants;  Surgeon: Wylene Simmer, MD;  Location: [place];  Service: Orthopedics;  Laterality:  Left;   . LEFT THUMB JOINT REPLACEMENT  2005   RIGHT DONE IN 2004  . OOPHORECTOMY    . POLYPECTOMY    . RIGHT KNEE ARTHROSCOPY  X2  BEFORE 2011  . RIGHT KNEE ARTHROSCOPY/ PARTIAL LATERAL MENISECTOMY/ TRICOMPARTMENT CHONDROPLASTY/ DECOMPRESSION CYST  10-26-2009  . RIGHT KNEE CLOSED MANIPULATION  09-11-2010  . RIGHT SHOULDER ARTHROSCOPY  2010  &  2004  . TIBIA CYST REMOVED AND ORIF LEG FX  1958  . TONSILLECTOMY  1968  . TOTAL KNEE ARTHROPLASTY  07-16-2010   RIGHT  . TOTAL THYROIDECTOMY  2009   CANCER  (POST-OP BLEED)  AND RADIATION [place]  . trigger fingers  Right 2013   3rd and 4th fingers  . UPPER GASTROINTESTINAL ENDOSCOPY    . VAGINAL HYSTERECTOMY  1990   Social History   Socioeconomic History  . Marital status: Married    Spouse name: Not on file  . Number of children: Not on file  . Years of education: Not on file  . Highest education level: Not on file  Occupational History    Comment: retired Charity fundraiser  Tobacco Use  . Smoking status: Never Smoker  . Smokeless tobacco: Never Used  Vaping Use  . Vaping Use: Never used  Substance and Sexual Activity  . Alcohol use: Yes    Comment: RARE  . Drug use: No  . Sexual activity: Not on file  Other Topics Concern  . Not on file  Social History Narrative   Lives with husband Dr Janace Hoard.   They have 2 adult children that live out of state.   Social Determinants of Health   Financial Resource Strain:   . Difficulty of Paying Living Expenses: Not on file  Food Insecurity:   . Worried About Programme researcher, broadcasting/film/video in the Last Year: Not on file  . Ran Out of Food in the Last Year: Not on file  Transportation Needs:   . Lack of Transportation (Medical): Not on file  . Lack of Transportation (Non-Medical): Not on file  Physical Activity:   . Days of Exercise per Week: Not on file  . Minutes of Exercise per Session: Not on file  Stress:   . Feeling of Stress : Not on file  Social Connections:   . Frequency of Communication with Friends and Family: Not on file  . Frequency of Social Gatherings with Friends and Family: Not on file  . Attends Religious Services: Not on file  . Active Member of Clubs or Organizations: Not on file  . Attends Banker Meetings: Not on file  . Marital Status: Not on file   Allergies  Allergen Reactions  . Statins Other (See Comments)    Pt has tried multiple and cannot tolerate  . Sulfa Antibiotics Hives   Family History  Problem Relation Age of Onset  . Colon polyps Sister 9  . Alzheimer's disease Mother   . Heart disease Father   .  Cirrhosis Father   . Heart attack Father   . Macular degeneration Father   . Colon cancer Neg Hx     Current Outpatient Medications (Endocrine & Metabolic):  Marland Kitchen  Exenatide ER (BYDUREON BCISE) 2 MG/0.85ML AUIJ, Inject 2 mg into the skin once a week. Marland Kitchen  FARXIGA 10 MG TABS tablet, TAKE 1 TABLET BY MOUTH ONCE A DAY .  JANUVIA 100 MG tablet, TAKE 1 TABLET BY MOUTH DAILY. Marland Kitchen  levothyroxine (SYNTHROID) 125 MCG tablet, Take 1 tablet (125 mcg  total) by mouth daily before breakfast. .  predniSONE (DELTASONE) 20 MG tablet, Take 40 mg daily for 3 days, then 20 mg daily for 3 days  Current Outpatient Medications (Cardiovascular):  Marland Kitchen  Alirocumab (PRALUENT) 75 MG/ML SOAJ, Inject 75 mg into the skin every 14 (fourteen) days. Marland Kitchen  losartan (COZAAR) 50 MG tablet, TAKE 1 TABLET (50 MG TOTAL) BY MOUTH DAILY. *PT NEED APPT* .  metoprolol succinate (TOPROL-XL) 50 MG 24 hr tablet, TAKE 1 TABLET (50 MG TOTAL) BY MOUTH DAILY. TAKE WITH OR IMMEDIATELY FOLLOWING A MEAL.  Current Outpatient Medications (Respiratory):  .  albuterol (VENTOLIN HFA) 108 (90 Base) MCG/ACT inhaler, Inhale 2 puffs into the lungs every 6 (six) hours as needed for wheezing or shortness of breath. .  benzonatate (TESSALON) 200 MG capsule, Take 1 capsule (200 mg total) by mouth 3 (three) times daily as needed for cough. .  montelukast (SINGULAIR) 10 MG tablet, Take 1 tablet (10 mg total) by mouth at bedtime. Marland Kitchen  NASONEX 50 MCG/ACT nasal spray, PLACE 2 SPRAYS INTO THE NOSE DAILY. (Patient taking differently: Place 2 sprays into the nose daily. ) .  Pseudoephedrine-DM-GG (ROBITUSSIN CF PO), Take 2 capsules by mouth daily.  Current Outpatient Medications (Analgesics):  .  oxyCODONE (ROXICODONE) 5 MG immediate release tablet, Take 1-1.5 tablets (5-7.5 mg total) by mouth every 6 (six) hours as needed for severe pain. Marland Kitchen  UBRELVY 50 MG TABS, TAKE 1 TABLET (50 MG) BY MOUTH EVERY 2 HOURS AS NEEDED. MAX $RemoveBef'200MG'mltiyZiKiV$  A DAY   Current Outpatient Medications (Other):   .  blood glucose meter kit and supplies KIT, Test blood sugar once daily. Dx code: E11.9 .  Blood Glucose Monitoring Suppl (FREESTYLE LITE) DEVI, Check blood sugar no more than twice daily .  cefdinir (OMNICEF) 300 MG capsule, Take 1 capsule (300 mg total) by mouth 2 (two) times daily. Use for 5 days as needed for UTI .  esomeprazole (NEXIUM) 40 MG capsule, Take 40 mg by mouth daily as needed (acid reflux).  Marland Kitchen  glucose blood (FREESTYLE LITE) test strip, CHECK BLOOD SUGARS NO MORE THAN TWICE DAILY .  Lancets (FREESTYLE) lancets, Check blood sugars no more than twice daily .  nitrofurantoin (MACRODANTIN) 50 MG capsule, Take 1 capsule (50 mg total) by mouth at bedtime. .  ondansetron (ZOFRAN) 4 MG tablet, TAKE 1 TABLET BY MOUTH EVERY 8 HOURS AS NEEDED FOR NAUSEA AND VOMITING .  topiramate ER (QUDEXY XR) 100 MG CS24 sprinkle capsule, Take 1 capsule (100 mg total) by mouth at bedtime. MUST BE SEEN FOR FURTHER REFILLS. CALL [phone]. .  traZODone (DESYREL) 100 MG tablet, TAKE 2.5 TABLETS BY MOUTH AT BEDTIME. Marland Kitchen  zolpidem (AMBIEN) 5 MG tablet, TAKE 1/2 TABLET BY MOUTH AT BEDTIME AS NEEDED FOR SLEEP .  gabapentin (NEURONTIN) 100 MG capsule, Take 2 pills ($Remove'200mg'sqitawA$  total) at bedtime   Reviewed prior external information including notes and imaging from  primary care provider As well as notes that were available from care everywhere and other healthcare systems.  Past medical history, social, surgical and family history all reviewed in electronic medical record.  No pertanent information unless stated regarding to the chief complaint.   Review of Systems:  No headache, visual changes, nausea, vomiting, diarrhea, constipation, dizziness, abdominal pain, skin rash, fevers, chills, night sweats, weight loss, swollen lymph nodes, joint swelling, chest pain, shortness of breath, mood changes. POSITIVE muscle aches, body aches   Objective  Blood pressure 110/70, pulse 79, height $RemoveBe'5\' 8"'TLJQerOKZ$  (1.727 m),  weight 186  lb (84.4 kg), SpO2 94 %.   General: No apparent distress alert and oriented x3 mood and affect normal, dressed appropriately.  HEENT: Pupils equal, extraocular movements intact  Respiratory: Patient's speak in full sentences and does not appear short of breath  Cardiovascular: No lower extremity edema, non tender, no erythema  Gait mild antalgic  Right hip more tightness then left , +FABER, pain in piriformis, mild positive SLT on right at 30 degrees of flexion. 4+/5 strenghth but symmetric    Impression and Recommendations:     The above documentation has been reviewed and is accurate and complete Lyndal Pulley, DO

## 2020-05-17 NOTE — Patient Instructions (Signed)
Good to see you  Gabapentin 200mg  at night  Xray today before you leave  Vitamin D 2000IU OTC Tennis ball in back right pocket with sitting Exercises 3x a week See me again in 2 weeks before your trip incase we need to do injections for piriformis syndrome

## 2020-05-18 NOTE — Telephone Encounter (Signed)
PA approved. Effective 05/17/2020 to 05/16/2021.

## 2020-05-19 ENCOUNTER — Encounter: Payer: Self-pay | Admitting: Family Medicine

## 2020-05-19 MED FILL — JANUVIA 100 MG TABLET: 100 | 30 days supply | Qty: 30 | Fill #1

## 2020-05-19 NOTE — Assessment & Plan Note (Signed)
Patient xrays do show some mild ASD at L5/S1 concerning for lumbar radiculopathy but also a lot of fetures of piriformis syndrome. Start HEP, discussed PT but patient has a trip coming up and wants to be able to walk. Start gabepentin and warned of side effects but could help with nerve pain. Then return in 4 weeks and if not better will do piriformis injection for diagnostic and potentially therapeutic purposes.

## 2020-05-21 MED FILL — BENZONATATE 200 MG CAPS: 200 | 20 days supply | Qty: 60 | Fill #1

## 2020-05-21 MED FILL — PRALUENT 75 MG/ML SOAJ: 75 | 28 days supply | Qty: 2 | Fill #0

## 2020-05-23 ENCOUNTER — Encounter: Payer: Self-pay | Admitting: Family Medicine

## 2020-05-24 ENCOUNTER — Other Ambulatory Visit: Payer: Self-pay | Admitting: Family Medicine

## 2020-05-24 MED ORDER — GABAPENTIN 300 MG PO CAPS: 300.0000 mg | ORAL_CAPSULE | Freq: Every day | ORAL | 1 refills | Status: DC

## 2020-05-24 MED FILL — GABAPENTIN 300 MG CAPSULE: 300 | 90 days supply | Qty: 90 | Fill #0

## 2020-05-25 ENCOUNTER — Other Ambulatory Visit: Payer: Self-pay | Admitting: Neurology

## 2020-05-27 ENCOUNTER — Encounter: Payer: Self-pay | Admitting: Emergency Medicine

## 2020-05-27 ENCOUNTER — Emergency Department (INDEPENDENT_AMBULATORY_CARE_PROVIDER_SITE_OTHER)
Admission: EM | Admit: 2020-05-27 | Discharge: 2020-05-27 | Disposition: A | Discharge status: Home / Self Care | Payer: 59 | Source: Home / Self Care

## 2020-05-27 ENCOUNTER — Emergency Department: Admit: 2020-05-27 | Discharge status: Absent without leave | Payer: Self-pay

## 2020-05-27 DIAGNOSIS — N3091 Cystitis, unspecified with hematuria: Secondary | ICD-10-CM | POA: Diagnosis not present

## 2020-05-27 LAB — POCT URINALYSIS DIP (MANUAL ENTRY)
Bilirubin, UA: NEGATIVE
Glucose, UA: 500 mg/dL — AB
Ketones, POC UA: NEGATIVE mg/dL
Nitrite, UA: POSITIVE — AB
Spec Grav, UA: <=1.005 — AB (ref 1.010–1.025)
Urobilinogen, UA: 0.2 E.U./dL
pH, UA: 5.5 (ref 5.0–8.0)

## 2020-05-27 LAB — POCT FASTING CBG KUC MANUAL ENTRY: POCT Glucose (KUC): 157 mg/dL — AB (ref 70–99)

## 2020-05-27 MED ORDER — CIPROFLOXACIN HCL 250 MG PO TABS: 250.0000 mg | ORAL_TABLET | Freq: Two times a day (BID) | ORAL | 0 refills | Status: AC

## 2020-05-27 NOTE — ED Triage Notes (Signed)
Patient c/o urgency, frequency, dysuria since this am.  Patient did take a Pyridum to help with the discomfort, afebrile.

## 2020-05-27 NOTE — ED Provider Notes (Signed)
[facility]    CSN: [id number] Arrival date & time: 05/27/20  1520      History   Chief Complaint Chief Complaint  Patient presents with  . Urinary Tract Infection    HPI Debbie Hayes is a 69 y.o. female.   HPI  Debbie Hayes is a 69 y.o. female presenting to UC with c/o urinary urgency, frequency and burning that started early this morning, gradually worsening. Hx of recurrent UTIs. Last UTI was on 05/15/20. She did a televisit and was prescribed cefdinir. She completed the course and felt better but is concerned the symptoms quickly returned.   Hx of recurrent UTIs. She already takes macrobid nightly to help prevent infections and is allergic to sulfa drugs. She reports taking cipro in the past and doing well with that medication.   Past Medical History:  Diagnosis Date  . Allergy   . Cancer [facility])    thyroid cancer  . Cataract   . Chronic migraine BOTOX INJECTION EVERY 3 MONTHS  . Chronic pain in right shoulder   . Constipation   . Diabetes mellitus   . Disorder of inner ear CAUSES VERTIGO OCCASIONALLY  . Fibromyalgia   . GERD (gastroesophageal reflux disease)   . Hemorrhoid   . History of bladder infections   . History of thyroid cancer 2009  S/P TOTAL THYROIDECTOMY AND RADIATION  . Hyperlipidemia   . Hypertension CARDIOLOGIST- DR Donnie Aho- WILL REQUEST LATE NOTE   DENIES S & S  . Hypothyroidism   . Insomnia   . Left shoulder pain   . Macular degeneration   . MRSA infection    nasal treated more than 15 years ago  . Normal nuclear stress test 01-25-2009  . OA (osteoarthritis) JOINTS  . Osteopenia   . Painful orthopaedic hardware (HCC)    left foot  . PONV (postoperative nausea and vomiting) SEVERE  . Rotator cuff disorder LEFT SHOULDER RTC IMPINGMENT  . Sleep apnea    mouth guard, no cpap   . Sleep apnea in adult 06/26/2017  . Spondylolisthesis of lumbar region   . 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 Wears glasses     Patient  Active Problem List   Diagnosis Date Noted  . Osteoporosis 04/05/2020  . Trigger finger, left middle finger 07/27/2019  . Chronic neck pain 11/10/2018  . Orthopedic hardware present 02/17/2018  . Diabetes mellitus due to underlying condition with complication (HCC) 12/15/2017  . 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 Arthralgia 12/15/2017  . Joint pain 12/15/2017  . Migraine, transformed 12/15/2017  . 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 Pain of left shoulder joint on movement 10/19/2017  . OSA (obstructive sleep apnea) 06/26/2017  . Snoring 03/25/2016  . Palpitations 03/25/2016  . Headache 03/25/2016  . Sensory disorder of trigeminal nerve 11/26/2015  . Spondylolisthesis of lumbar region 08/09/2014  . Lumbar adjacent segment disease with spondylolisthesis 08/09/2014  . Back pain 02/17/2014  . Macular degeneration 02/17/2014  . Backache 02/17/2014  . Other and unspecified hyperlipidemia 10/14/2013  . Hyperlipidemia 10/14/2013  . Onychomycosis 12/01/2012  . Arthritis 04/12/2012  . Hypothyroidism 04/12/2012    Past Surgical History:  Procedure Laterality Date  . BACK SURGERY     Dr Lovell Sheehan- spondylosis  . BILATERAL CARPAL TUNNEL RELEASE  1994  . BILATERAL ELBOW SURG.  1999  . BILATERAL SALPINGOOPHECTOMY  1993   POST-OP URETER REPAIR 12 DAYS AFTER   . CATARACT EXTRACTION,  BILATERAL    . COLONOSCOPY    . EYE SURGERY    . FOOT ARTHRODESIS Left 07/10/2016   Procedure: LEFT 2ND TARSAL METATARSAL ARTHRODESIS  GASTROC RECESSION LEFT LAPIDUS MODIFIED MCBRIDE BUIONECTOMY;  Surgeon: Toni Arthurs, MD;  Location: [facility];  Service: Orthopedics;  Laterality: Left;  Marland Kitchen GASTROC RECESSION EXTREMITY Left 07/10/2016   Procedure: GASTROC RECESSION EXTREMITY;  Surgeon: Toni Arthurs, MD;  Location: [facility];  Service: Orthopedics;  Laterality: Left;  . HARDWARE REMOVAL Left 05/20/2018   Procedure: Left foot  removal of deep implants;  Surgeon: Toni Arthurs, MD;  Location: [facility];  Service: Orthopedics;  Laterality: Left;   . LEFT THUMB JOINT REPLACEMENT  2005   RIGHT DONE IN 2004  . OOPHORECTOMY    . POLYPECTOMY    . RIGHT KNEE ARTHROSCOPY  X2  BEFORE 2011  . RIGHT KNEE ARTHROSCOPY/ PARTIAL LATERAL MENISECTOMY/ TRICOMPARTMENT CHONDROPLASTY/ DECOMPRESSION CYST  10-26-2009  . RIGHT KNEE CLOSED MANIPULATION  09-11-2010  . RIGHT SHOULDER ARTHROSCOPY  2010  &  2004  . TIBIA CYST REMOVED AND ORIF LEG FX  1958  . TONSILLECTOMY  1968  . TOTAL KNEE ARTHROPLASTY  07-16-2010   RIGHT  . TOTAL THYROIDECTOMY  2009   CANCER  (POST-OP BLEED)  AND RADIATION [place]  . trigger fingers Right 2013   3rd and 4th fingers  . UPPER GASTROINTESTINAL ENDOSCOPY    . VAGINAL HYSTERECTOMY  1990    OB History   No obstetric history on file.      Home Medications    Prior to Admission medications   Medication Sig Start Date End Date Taking? Authorizing Provider  albuterol (VENTOLIN HFA) 108 (90 Base) MCG/ACT inhaler Inhale 2 puffs into the lungs every 6 (six) hours as needed for wheezing or shortness of breath. 01/01/20  Yes Copland, Gwenlyn Found, MD  Alirocumab (PRALUENT) 75 MG/ML SOAJ Inject 75 mg into the skin every 14 (fourteen) days. 05/04/20  Yes Copland, Gwenlyn Found, MD  blood glucose meter kit and supplies KIT Test blood sugar once daily. Dx code: E11.9 03/08/15  Yes Collene Gobble, MD  Blood Glucose Monitoring Suppl (FREESTYLE LITE) DEVI Check blood sugar no more than twice daily 09/23/17  Yes Copland, Gwenlyn Found, MD  esomeprazole (NEXIUM) 40 MG capsule Take 40 mg by mouth daily as needed (acid reflux).    Yes [provider]  Exenatide ER (BYDUREON BCISE) 2 MG/0.85ML AUIJ Inject 2 mg into the skin once a week. 05/14/20  Yes Copland, Gwenlyn Found, MD  FARXIGA 10 MG TABS tablet TAKE 1 TABLET BY MOUTH ONCE A DAY 12/20/19  Yes Copland, Gwenlyn Found, MD  gabapentin (NEURONTIN) 300 MG capsule Take  1 capsule (300 mg total) by mouth at bedtime. 05/24/20  Yes Antoine Primas M, DO  glucose blood (FREESTYLE LITE) test strip CHECK BLOOD SUGARS NO MORE THAN TWICE DAILY 10/26/18  Yes Copland, Gwenlyn Found, MD  JANUVIA 100 MG tablet TAKE 1 TABLET BY MOUTH DAILY. 04/25/20  Yes Copland, Gwenlyn Found, MD  Lancets (FREESTYLE) lancets Check blood sugars no more than twice daily 09/23/17  Yes Copland, Gwenlyn Found, MD  levothyroxine (SYNTHROID) 125 MCG tablet Take 1 tablet (125 mcg total) by mouth daily before breakfast. 01/24/20  Yes Copland, Gwenlyn Found, MD  losartan (COZAAR) 50 MG tablet TAKE 1 TABLET (50 MG TOTAL) BY MOUTH DAILY. *PT NEED APPT* 01/05/20  Yes Copland, Gwenlyn Found, MD  metoprolol succinate (TOPROL-XL) 50 MG 24 hr  tablet TAKE 1 TABLET (50 MG TOTAL) BY MOUTH DAILY. TAKE WITH OR IMMEDIATELY FOLLOWING A MEAL. 09/21/19  Yes Copland, Gay Filler, MD  montelukast (SINGULAIR) 10 MG tablet Take 1 tablet (10 mg total) by mouth at bedtime. 10/19/19  Yes Copland, Gay Filler, MD  NASONEX 50 MCG/ACT nasal spray PLACE 2 SPRAYS INTO THE NOSE DAILY. Patient taking differently: Place 2 sprays into the nose daily.  09/20/14  Yes [name], MD  ondansetron (ZOFRAN) 4 MG tablet TAKE 1 TABLET BY MOUTH EVERY 8 HOURS AS NEEDED FOR NAUSEA AND VOMITING 04/13/18  Yes Copland, Gay Filler, MD  oxyCODONE (ROXICODONE) 5 MG immediate release tablet Take 1-1.5 tablets (5-7.5 mg total) by mouth every 6 (six) hours as needed for severe pain. 05/02/20  Yes Copland, Gay Filler, MD  topiramate ER (QUDEXY XR) 100 MG CS24 sprinkle capsule Take 1 capsule (100 mg total) by mouth at bedtime. MUST BE SEEN FOR FURTHER REFILLS. CALL [phone]. 05/01/20  Yes Melvenia Beam, MD  traZODone (DESYREL) 100 MG tablet TAKE 2.5 TABLETS BY MOUTH AT BEDTIME. 12/02/19  Yes Copland, Gay Filler, MD  UBRELVY 50 MG TABS TAKE 1 TABLET (50 MG) BY MOUTH EVERY 2 HOURS AS NEEDED. MAX $RemoveBef'200MG'[id number]$  A DAY 03/13/20  Yes Copland, Gay Filler, MD  zolpidem (AMBIEN) 5 MG tablet TAKE 1/2 TABLET  BY MOUTH AT BEDTIME AS NEEDED FOR SLEEP 03/26/20  Yes Copland, Gay Filler, MD  benzonatate (TESSALON) 200 MG capsule Take 1 capsule (200 mg total) by mouth 3 (three) times daily as needed for cough. 05/02/20   Copland, Gay Filler, MD  ciprofloxacin (CIPRO) 250 MG tablet Take 1 tablet (250 mg total) by mouth 2 (two) times daily for 5 days. 05/27/20 06/01/20  Noe Gens, PA-C  nitrofurantoin (MACRODANTIN) 50 MG capsule Take 1 capsule (50 mg total) by mouth at bedtime. 06/23/19   Copland, Gay Filler, MD  predniSONE (DELTASONE) 20 MG tablet Take 40 mg daily for 3 days, then 20 mg daily for 3 days 05/02/20   Copland, Gay Filler, MD  Pseudoephedrine-DM-GG (ROBITUSSIN CF PO) Take 2 capsules by mouth daily.    [provider]    Family History Family History  Problem Relation Age of Onset  . Colon polyps Sister 81  . Alzheimer's disease Mother   . Heart disease Father   . Cirrhosis Father   . Heart attack Father   . Macular degeneration Father   . Colon cancer Neg Hx     Social History Social History   Tobacco Use  . Smoking status: Never Smoker  . Smokeless tobacco: Never Used  Vaping Use  . Vaping Use: Never used  Substance Use Topics  . Alcohol use: Yes    Comment: RARE  . Drug use: No     Allergies   Statins and Sulfa antibiotics   Review of Systems Review of Systems  Constitutional: Negative for chills and fever.  Gastrointestinal: Negative for abdominal pain, diarrhea, nausea and vomiting.  Genitourinary: Positive for dysuria, frequency, hematuria and urgency. Negative for flank pain, vaginal bleeding, vaginal discharge and vaginal pain.  Musculoskeletal: Negative for back pain.     Physical Exam Triage Vital Signs ED Triage Vitals [05/27/20 1532]  Enc Vitals Group     BP 109/68     Pulse Rate 69     Resp      Temp 98.7 F (37.1 C)     Temp Source Oral     SpO2 95 %  Weight      Height      Head Circumference      Peak Flow      Pain Score 2      Pain Loc      Pain Edu?      Excl. in GC?    No data found.  Updated Vital Signs BP 109/68 (BP Location: Left Arm)   Pulse 69   Temp 98.7 F (37.1 C) (Oral)   SpO2 95%   Visual Acuity Right Eye Distance:   Left Eye Distance:   Bilateral Distance:    Right Eye Near:   Left Eye Near:    Bilateral Near:     Physical Exam Vitals and nursing note reviewed.  Constitutional:      Appearance: Normal appearance. She is well-developed.  HENT:     Head: Normocephalic and atraumatic.  Cardiovascular:     Rate and Rhythm: Normal rate and regular rhythm.  Pulmonary:     Effort: Pulmonary effort is normal. No respiratory distress.     Breath sounds: Normal breath sounds.  Abdominal:     General: There is no distension.     Palpations: Abdomen is soft.     Tenderness: There is no abdominal tenderness. There is no right CVA tenderness or left CVA tenderness.  Musculoskeletal:        General: Normal range of motion.     Cervical back: Normal range of motion.  Skin:    General: Skin is warm and dry.  Neurological:     Mental Status: She is alert and oriented to person, place, and time.  Psychiatric:        Behavior: Behavior normal.      UC Treatments / Results  Labs (all labs ordered are listed, but only abnormal results are displayed) Labs Reviewed  POCT URINALYSIS DIP (MANUAL ENTRY) - Abnormal; Notable for the following components:      Result Value   Color, UA other (*)    Clarity, UA turbid (*)    Glucose, UA =500 (*)    Spec Grav, UA <=1.005 (*)    Blood, UA moderate (*)    Protein Ur, POC trace (*)    Nitrite, UA Positive (*)    Leukocytes, UA Trace (*)    All other components within normal limits  POCT FASTING CBG [facility] MANUAL ENTRY - Abnormal; Notable for the following components:   POCT Glucose ([facility]) 157 (*)    All other components within normal limits  URINE CULTURE    EKG   Radiology No results found.  Procedures Procedures (including critical care  time)  Medications Ordered in UC Medications - No data to display  Initial Impression / Assessment and Plan / UC Course  I have reviewed the triage vital signs and the nursing notes.  Pertinent labs & imaging results that were available during my care of the patient were reviewed by me and considered in my medical decision making (see chart for details).     UA c/w UTI Culture sent  Will start pt on Cipro, pt states she has done well on this medication in the past w/o reaction F/u with PCP AVS given  Final Clinical Impressions(s) / UC Diagnoses   Final diagnoses:  Cystitis with hematuria     Discharge Instructions      Please take your antibiotic as prescribed. A urine culture has been sent to check the severity of your urinary infection and to determine if you are  on the most appropriate antibiotic. The results should come back within 2-3 days. You will only be notified if a medication change is indicated.  Please follow up with family medicine or urology if not improving within 1 week, sooner if symptoms worsening.      ED Prescriptions    Medication Sig Dispense Auth. Provider   ciprofloxacin (CIPRO) 250 MG tablet Take 1 tablet (250 mg total) by mouth 2 (two) times daily for 5 days. 10 tablet Noe Gens, PA-C     PDMP not reviewed this encounter.   Noe Gens, PA-C 05/28/20 0830

## 2020-05-27 NOTE — Discharge Instructions (Signed)
  Please take your antibiotic as prescribed. A urine culture has been sent to check the severity of your urinary infection and to determine if you are on the most appropriate antibiotic. The results should come back within 2-3 days. You will only be notified if a medication change is indicated.  Please follow up with family medicine or urology if not improving within 1 week, sooner if symptoms worsening.   

## 2020-05-28 ENCOUNTER — Other Ambulatory Visit: Payer: Self-pay | Admitting: Neurology

## 2020-05-29 MED FILL — TOPIRAMATE ER 100 MG CAP: 100 | 30 days supply | Qty: 30 | Fill #0

## 2020-05-30 ENCOUNTER — Ambulatory Visit: Payer: 59 | Admitting: Family Medicine

## 2020-05-30 LAB — URINE CULTURE
MICRO NUMBER:: 11142056
SPECIMEN QUALITY:: ADEQUATE

## 2020-06-05 MED FILL — BENZONATATE 200 MG CAPS: 200 | 20 days supply | Qty: 60 | Fill #1

## 2020-06-11 MED FILL — NITROFURANTOIN MCR 50 MG CA: 50 | 90 days supply | Qty: 90 | Fill #3

## 2020-06-11 MED FILL — METOPROLOL SUCCINATE ER 50: 50 | 90 days supply | Qty: 90 | Fill #3

## 2020-06-12 MED FILL — PRALUENT 75 MG/ML SOAJ: 75 | 28 days supply | Qty: 2 | Fill #1

## 2020-06-15 MED FILL — JANUVIA 100 MG TABLET: 100 | 30 days supply | Qty: 30 | Fill #2

## 2020-06-15 MED FILL — UBRELVY 50 MG TABS: 50 | 5 days supply | Qty: 10 | Fill #1

## 2020-06-22 MED FILL — LOSARTAN POTASSIUM 50 MG TA: 50 | 90 days supply | Qty: 90 | Fill #1

## 2020-07-03 MED FILL — TOPIRAMATE ER 100 MG CAP: 100 | 30 days supply | Qty: 30 | Fill #0

## 2020-07-03 MED FILL — UBRELVY 50 MG TABS: 50 | 5 days supply | Qty: 10 | Fill #2

## 2020-07-07 ENCOUNTER — Encounter: Payer: Self-pay | Admitting: Family Medicine

## 2020-07-07 ENCOUNTER — Other Ambulatory Visit: Payer: Self-pay | Admitting: Family Medicine

## 2020-07-07 MED ORDER — DOXYCYCLINE HYCLATE 100 MG PO CAPS: 100.0000 mg | ORAL_CAPSULE | Freq: Two times a day (BID) | ORAL | 0 refills | Status: DC

## 2020-07-09 MED FILL — MONTELUKAST SOD 10 MG TAB: 10 | 90 days supply | Qty: 90 | Fill #3

## 2020-07-09 MED FILL — DOXYCYCLINE HYCLATE 100 MG: 100 | 10 days supply | Qty: 20 | Fill #0

## 2020-07-10 MED FILL — PRALUENT 75 MG/ML SOAJ: 75 | 28 days supply | Qty: 2 | Fill #2

## 2020-07-16 MED FILL — JANUVIA 100 MG TABLET: 100 | 30 days supply | Qty: 30 | Fill #3

## 2020-07-17 ENCOUNTER — Other Ambulatory Visit: Payer: Self-pay | Admitting: Family Medicine

## 2020-07-17 MED FILL — METAXALONE 800 MG TABS: 800 | 30 days supply | Qty: 90 | Fill #0

## 2020-07-19 MED FILL — PRALUENT 75 MG/ML SOAJ: 75 | 28 days supply | Qty: 2 | Fill #2

## 2020-07-23 MED FILL — FARXIGA 10 MG TABLET: 10 | 90 days supply | Qty: 90 | Fill #2

## 2020-07-27 ENCOUNTER — Other Ambulatory Visit: Payer: Self-pay | Admitting: Neurology

## 2020-07-30 ENCOUNTER — Other Ambulatory Visit: Payer: Self-pay | Admitting: Neurology

## 2020-08-02 ENCOUNTER — Other Ambulatory Visit: Payer: Self-pay | Admitting: Family Medicine

## 2020-08-02 MED FILL — TOPIRAMATE ER 100 MG CAP: 100 | 90 days supply | Qty: 90 | Fill #0

## 2020-08-09 MED FILL — LEVOTHYROXINE 125 MCG TAB: 125 | 90 days supply | Qty: 90 | Fill #2

## 2020-08-10 DIAGNOSIS — Z20822 Contact with and (suspected) exposure to covid-19: Secondary | ICD-10-CM | POA: Diagnosis not present

## 2020-08-10 MED FILL — PRALUENT 75 MG/ML SOAJ: 75 | 28 days supply | Qty: 2 | Fill #3

## 2020-08-10 MED FILL — METAXALONE 800 MG TABS: 800 | 30 days supply | Qty: 90 | Fill #1

## 2020-08-13 MED FILL — traZODone HCL 100 MG TABS: 100 | 90 days supply | Qty: 225 | Fill #2

## 2020-08-15 ENCOUNTER — Other Ambulatory Visit: Payer: Self-pay | Admitting: Family Medicine

## 2020-08-15 DIAGNOSIS — E119 Type 2 diabetes mellitus without complications: Secondary | ICD-10-CM

## 2020-08-15 MED FILL — JANUVIA 100 MG TABLET: 100 | 30 days supply | Qty: 30 | Fill #0

## 2020-08-16 MED FILL — GABAPENTIN 300 MG CAPSULE: 300 | 90 days supply | Qty: 90 | Fill #1

## 2020-08-29 ENCOUNTER — Ambulatory Visit: Payer: 59 | Admitting: Adult Health

## 2020-08-29 NOTE — Progress Notes (Addendum)
[facility] [address], [place], [place] [address] [phone]  Date:  08/30/2020   Name:  [name]   DOB:  09-Dec-1950   MRN:  [id number]  PCP:  Darreld Mclean, MD    Chief Complaint: Urinary Tract Infection (Pressure, checked with dipstick at home)   History of Present Illness:  Debbie Hayes is a 70 y.o. very pleasant female patient who presents with the following:  Patient with history of migraine headache, sleep apnea, diabetes, hypothyroidism, hyperlipidemia Patient today with concern of possible UTI Last seen by myself in October She was also seen at urgent care on October 31 with hematuria She had a culture confirmed E. coli UTI at that time, culture and sensitivity available on chart  COVID-19 booster Lab Results  Component Value Date   HGBA1C 7.4 (H) 08/30/2020   Can update A1c today if desired  She took a course of omnicef on 1/7 for 10 days- she was on the way to [place] and was worried about a UTI She still continues to feel a little off with her urination- no hematuria, she is otherwise feeling ok   She started on praluent for her lipids-so far tolerating this well.  She has used this for about 3 months now.  We will go ahead and check lipids to judge effect  Patient Active Problem List   Diagnosis Date Noted  . Osteoporosis 04/05/2020  . Trigger finger, left middle finger 07/27/2019  . Chronic neck pain 11/10/2018  . Orthopedic hardware present 02/17/2018  . Diabetes mellitus due to underlying condition with complication ([place]) 50/93/2671  . 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 Arthralgia 12/15/2017  . Joint pain 12/15/2017  . Migraine, transformed 12/15/2017  . 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 Pain of left shoulder joint on movement 10/19/2017  . OSA (obstructive sleep apnea) 06/26/2017  . Snoring 03/25/2016  .  Palpitations 03/25/2016  . Headache 03/25/2016  . Sensory disorder of trigeminal nerve 11/26/2015  . Spondylolisthesis of lumbar region 08/09/2014  . Lumbar adjacent segment disease with spondylolisthesis 08/09/2014  . Back pain 02/17/2014  . Macular degeneration 02/17/2014  . Backache 02/17/2014  . Other and unspecified hyperlipidemia 10/14/2013  . Hyperlipidemia 10/14/2013  . Onychomycosis 12/01/2012  . Arthritis 04/12/2012  . Hypothyroidism 04/12/2012    Past Medical History:  Diagnosis Date  . Allergy   . Cancer [facility])    thyroid cancer  . Cataract   . Chronic migraine BOTOX INJECTION EVERY 3 MONTHS  . Chronic pain in right shoulder   . Constipation   . Diabetes mellitus   . Disorder of inner ear CAUSES VERTIGO OCCASIONALLY  . Fibromyalgia   . GERD (gastroesophageal reflux disease)   . Hemorrhoid   . History of bladder infections   . History of thyroid cancer 2009  S/P TOTAL THYROIDECTOMY AND RADIATION  . Hyperlipidemia   . Hypertension CARDIOLOGIST- DR Wynonia Lawman- WILL REQUEST LATE NOTE   DENIES S & S  . Hypothyroidism   . Insomnia   . Left shoulder pain   . Macular degeneration   . MRSA infection    nasal treated more than 15 years ago  . Normal nuclear stress test 01-25-2009  . OA (osteoarthritis) JOINTS  . Osteopenia   . Painful orthopaedic hardware ([place])    left foot  . PONV (postoperative nausea and vomiting) SEVERE  . Rotator cuff disorder  LEFT SHOULDER RTC IMPINGMENT  . Sleep apnea    mouth guard, no cpap   . Sleep apnea in adult 06/26/2017  . Spondylolisthesis of lumbar region   . 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 Wears glasses     Past Surgical History:  Procedure Laterality Date  . BACK SURGERY     Dr Lovell Sheehan- spondylosis  . BILATERAL CARPAL TUNNEL RELEASE  1994  . BILATERAL ELBOW SURG.  1999  . BILATERAL SALPINGOOPHECTOMY  1993   POST-OP URETER REPAIR 12 DAYS AFTER   . CATARACT EXTRACTION, BILATERAL    . COLONOSCOPY    . EYE SURGERY     . FOOT ARTHRODESIS Left 07/10/2016   Procedure: LEFT 2ND TARSAL METATARSAL ARTHRODESIS  GASTROC RECESSION LEFT LAPIDUS MODIFIED MCBRIDE BUIONECTOMY;  Surgeon: Toni Arthurs, MD;  Location: [facility];  Service: Orthopedics;  Laterality: Left;  Marland Kitchen GASTROC RECESSION EXTREMITY Left 07/10/2016   Procedure: GASTROC RECESSION EXTREMITY;  Surgeon: Toni Arthurs, MD;  Location: [facility];  Service: Orthopedics;  Laterality: Left;  . HARDWARE REMOVAL Left 05/20/2018   Procedure: Left foot removal of deep implants;  Surgeon: Toni Arthurs, MD;  Location: [facility];  Service: Orthopedics;  Laterality: Left;   . LEFT THUMB JOINT REPLACEMENT  2005   RIGHT DONE IN 2004  . OOPHORECTOMY    . POLYPECTOMY    . RIGHT KNEE ARTHROSCOPY  X2  BEFORE 2011  . RIGHT KNEE ARTHROSCOPY/ PARTIAL LATERAL MENISECTOMY/ TRICOMPARTMENT CHONDROPLASTY/ DECOMPRESSION CYST  10-26-2009  . RIGHT KNEE CLOSED MANIPULATION  09-11-2010  . RIGHT SHOULDER ARTHROSCOPY  2010  &  2004  . TIBIA CYST REMOVED AND ORIF LEG FX  1958  . TONSILLECTOMY  1968  . TOTAL KNEE ARTHROPLASTY  07-16-2010   RIGHT  . TOTAL THYROIDECTOMY  2009   CANCER  (POST-OP BLEED)  AND RADIATION [place]  . trigger fingers Right 2013   3rd and 4th fingers  . UPPER GASTROINTESTINAL ENDOSCOPY    . VAGINAL HYSTERECTOMY  1990    Social History   Tobacco Use  . Smoking status: Never Smoker  . Smokeless tobacco: Never Used  Vaping Use  . Vaping Use: Never used  Substance Use Topics  . Alcohol use: Yes    Comment: RARE  . Drug use: No    Family History  Problem Relation Age of Onset  . Colon polyps Sister 70  . Alzheimer's disease Mother   . Heart disease Father   . Cirrhosis Father   . Heart attack Father   . Macular degeneration Father   . Colon cancer Neg Hx     Allergies  Allergen Reactions  . Statins Other (See Comments)    Pt has tried multiple and cannot tolerate  . Sulfa Antibiotics Hives     Medication list has been reviewed and updated.  Current Outpatient Medications on File Prior to Visit  Medication Sig Dispense Refill  . albuterol (VENTOLIN HFA) 108 (90 Base) MCG/ACT inhaler Inhale 2 puffs into the lungs every 6 (six) hours as needed for wheezing or shortness of breath. 18 g 6  . Alirocumab (PRALUENT) 75 MG/ML SOAJ Inject 75 mg into the skin every 14 (fourteen) days. 2 mL 3  . blood glucose meter kit and supplies KIT Test blood sugar once daily. Dx code: E11.9 1 each 0  . Blood Glucose Monitoring Suppl (FREESTYLE LITE) DEVI Check blood sugar no more than twice daily 1 each 0  . esomeprazole (NEXIUM)  40 MG capsule Take 40 mg by mouth daily as needed (acid reflux).     . Exenatide ER (BYDUREON BCISE) 2 MG/0.85ML AUIJ Inject 2 mg into the skin once a week. 8 mL 12  . FARXIGA 10 MG TABS tablet TAKE 1 TABLET BY MOUTH ONCE A DAY 90 tablet 3  . gabapentin (NEURONTIN) 300 MG capsule Take 1 capsule (300 mg total) by mouth at bedtime. 90 capsule 1  . glucose blood (FREESTYLE LITE) test strip CHECK BLOOD SUGARS NO MORE THAN TWICE DAILY 100 each 12  . Lancets (FREESTYLE) lancets Check blood sugars no more than twice daily 100 each 12  . levothyroxine (SYNTHROID) 125 MCG tablet Take 1 tablet (125 mcg total) by mouth daily before breakfast. 90 tablet 3  . losartan (COZAAR) 50 MG tablet TAKE 1 TABLET (50 MG TOTAL) BY MOUTH DAILY. *PT NEED APPT* 90 tablet 0  . metaxalone (SKELAXIN) 800 MG tablet TAKE 1 TABLET BY MOUTH 3 TIMES DAILY. 90 tablet 3  . metoprolol succinate (TOPROL-XL) 50 MG 24 hr tablet TAKE 1 TABLET (50 MG TOTAL) BY MOUTH DAILY. TAKE WITH OR IMMEDIATELY FOLLOWING A MEAL. 90 tablet 3  . montelukast (SINGULAIR) 10 MG tablet Take 1 tablet (10 mg total) by mouth at bedtime. 90 tablet 3  . NASONEX 50 MCG/ACT nasal spray PLACE 2 SPRAYS INTO THE NOSE DAILY. (Patient taking differently: Place 2 sprays into the nose daily.) 17 g 9  . nitrofurantoin (MACRODANTIN) 50 MG capsule Take 1  capsule (50 mg total) by mouth at bedtime. 90 capsule 3  . predniSONE (DELTASONE) 20 MG tablet Take 40 mg daily for 3 days, then 20 mg daily for 3 days 9 tablet 0  . Pseudoephedrine-DM-GG (ROBITUSSIN CF PO) Take 2 capsules by mouth daily.    . sitaGLIPtin (JANUVIA) 100 MG tablet Take 1 tablet (100 mg total) by mouth daily. 90 tablet 1  . topiramate ER (QUDEXY XR) 100 MG CS24 sprinkle capsule Take 1 capsule (100 mg total) by mouth daily. Take at bedtime 90 capsule 3  . traZODone (DESYREL) 100 MG tablet TAKE 2.5 TABLETS BY MOUTH AT BEDTIME. 225 tablet 3  . UBRELVY 50 MG TABS TAKE 1 TABLET (50 MG) BY MOUTH EVERY 2 HOURS AS NEEDED. MAX $RemoveBef'200MG'WyRlEcXcDx$  A DAY 10 tablet 5  . zolpidem (AMBIEN) 5 MG tablet TAKE 1/2 TABLET BY MOUTH AT BEDTIME AS NEEDED FOR SLEEP 30 tablet 1   No current facility-administered medications on file prior to visit.    Review of Systems:  As per HPI- otherwise negative.   Physical Examination: Vitals:   08/30/20 1422  BP: 126/74  Pulse: 76  Resp: 15  SpO2: 98%   Vitals:   08/30/20 1422  Weight: 186 lb (84.4 kg)  Height: $Remove'5\' 8"'oaVAhKp$  (1.727 m)   Body mass index is 28.28 kg/m. Ideal Body Weight: Weight in (lb) to have BMI = 25: 164.1  GEN: no acute distress. Overweight but looks well  HEENT: Atraumatic, Normocephalic.  Ears and Nose: No external deformity. CV: RRR, No M/G/R. No JVD. No thrill. No extra heart sounds. PULM: CTA B, no wheezes, crackles, rhonchi. No retractions. No resp. distress. No accessory muscle use. ABD: S, NT, ND, +BS. No rebound. No HSM. EXTR: No c/c/e PSYCH: Normally interactive. Conversant.  No CVA tenderness  Wt Readings from Last 3 Encounters:  08/30/20 186 lb (84.4 kg)  05/17/20 186 lb (84.4 kg)  05/02/20 186 lb (84.4 kg)    Assessment and Plan: Urinary frequency - Plan:  POCT urinalysis dipstick, Urine Culture  Controlled type 2 diabetes mellitus with complication, without long-term current use of insulin ([place]) - Plan: Hemoglobin  A1c  Elevated lipoprotein(a) - Plan: Lipid panel  Patient today to follow-up on mild urinary discomfort.  She had suspicion of UTI about a month ago, took a course of Omnicef that she had on hand.  Since that time she has noticed an increased awareness of her urinary system, but no major symptoms.  UA today is nonspecific, like this area is due to use of [place].  Will obtain culture and treat depending on results  She would like to recheck A1c today, we will also do a lipid profile as she is now taking a PCSK9 inhibitor This visit occurred during the SARS-CoV-2 public health emergency.  Safety protocols were in place, including screening questions prior to the visit, additional usage of staff PPE, and extensive cleaning of exam room while observing appropriate contact time as indicated for disinfecting solutions.    Signed Lamar Blinks, MD   Received her labs 2/4- message to pt  Results for orders placed or performed in visit on 08/30/20  Urine Culture   Specimen: Urine  Result Value Ref Range   MICRO NUMBER: [id number]    SPECIMEN QUALITY: Adequate    Sample Source NOT GIVEN    STATUS: FINAL    Result: No Growth   Hemoglobin A1c  Result Value Ref Range   Hgb A1c MFr Bld 7.4 (H) 4.6 - 6.5 %  Lipid panel  Result Value Ref Range   Cholesterol 147 0 - 200 mg/dL   Triglycerides 151.0 (H) 0.0 - 149.0 mg/dL   HDL 46.60 >39.00 mg/dL   VLDL 30.2 0.0 - 40.0 mg/dL   LDL Cholesterol 71 0 - 99 mg/dL   Total CHOL/HDL Ratio 3    NonHDL 100.77   POCT urinalysis dipstick  Result Value Ref Range   Color, UA yellow yellow   Clarity, UA clear clear   Glucose, UA >=1,000 (A) negative mg/dL   Bilirubin, UA negative negative   Ketones, POC UA negative negative mg/dL   Spec Grav, UA 1.025 1.010 - 1.025   Blood, UA negative negative   pH, UA 6.0 5.0 - 8.0   Protein Ur, POC trace (A) negative mg/dL   Urobilinogen, UA 0.2 0.2 or 1.0 E.U./dL   Nitrite, UA Negative Negative   Leukocytes, UA  Negative Negative

## 2020-08-30 ENCOUNTER — Encounter: Payer: Self-pay | Admitting: Family Medicine

## 2020-08-30 ENCOUNTER — Other Ambulatory Visit: Payer: Self-pay

## 2020-08-30 ENCOUNTER — Ambulatory Visit: Payer: 59 | Admitting: Family Medicine

## 2020-08-30 VITALS — BP 126/74 | HR 76 | Resp 15 | Ht 68.0 in | Wt 186.0 lb

## 2020-08-30 DIAGNOSIS — E118 Type 2 diabetes mellitus with unspecified complications: Secondary | ICD-10-CM

## 2020-08-30 DIAGNOSIS — E7841 Elevated Lipoprotein(a): Secondary | ICD-10-CM

## 2020-08-30 DIAGNOSIS — R350 Frequency of micturition: Secondary | ICD-10-CM

## 2020-08-30 DIAGNOSIS — E088 Diabetes mellitus due to underlying condition with unspecified complications: Secondary | ICD-10-CM

## 2020-08-30 LAB — POCT URINALYSIS DIP (MANUAL ENTRY)
Bilirubin, UA: NEGATIVE
Blood, UA: NEGATIVE
Glucose, UA: >=1000 mg/dL — AB
Ketones, POC UA: NEGATIVE mg/dL
Leukocytes, UA: NEGATIVE
Nitrite, UA: NEGATIVE
Spec Grav, UA: 1.025 (ref 1.010–1.025)
Urobilinogen, UA: 0.2 E.U./dL
pH, UA: 6 (ref 5.0–8.0)

## 2020-08-30 NOTE — Patient Instructions (Signed)
It was great to see you again today- I will be in touch with your labs and urine culture asap

## 2020-08-31 ENCOUNTER — Encounter: Payer: Self-pay | Admitting: Family Medicine

## 2020-08-31 LAB — URINE CULTURE
MICRO NUMBER:: 11491864
Result:: NO GROWTH
SPECIMEN QUALITY:: ADEQUATE

## 2020-08-31 LAB — HEMOGLOBIN A1C: Hgb A1c MFr Bld: 7.4 % — ABNORMAL HIGH (ref 4.6–6.5)

## 2020-08-31 LAB — LIPID PANEL
Cholesterol: 147 mg/dL (ref 0–200)
HDL: 46.6 mg/dL (ref >39.00)
LDL Cholesterol: 71 mg/dL (ref 0–99)
NonHDL: 100.77
Total CHOL/HDL Ratio: 3
Triglycerides: 151 mg/dL — ABNORMAL HIGH (ref 0.0–149.0)
VLDL: 30.2 mg/dL (ref 0.0–40.0)

## 2020-09-01 ENCOUNTER — Encounter: Payer: Self-pay | Admitting: Family Medicine

## 2020-09-07 ENCOUNTER — Other Ambulatory Visit: Payer: Self-pay | Admitting: Family Medicine

## 2020-09-07 DIAGNOSIS — Z96651 Presence of right artificial knee joint: Secondary | ICD-10-CM | POA: Diagnosis not present

## 2020-09-07 DIAGNOSIS — E785 Hyperlipidemia, unspecified: Secondary | ICD-10-CM

## 2020-09-07 MED FILL — ZOLPIDEM TARTRATE 5 MG TABS: 5 | 60 days supply | Qty: 30 | Fill #1

## 2020-09-07 MED FILL — METOPROLOL SUCCINATE ER 50: 50 | 90 days supply | Qty: 90 | Fill #0

## 2020-09-10 ENCOUNTER — Other Ambulatory Visit: Payer: Self-pay | Admitting: Family Medicine

## 2020-09-10 DIAGNOSIS — N390 Urinary tract infection, site not specified: Secondary | ICD-10-CM

## 2020-09-10 MED FILL — JANUVIA 100 MG TABLET: 100 | 30 days supply | Qty: 30 | Fill #1

## 2020-09-10 MED FILL — NITROFURANTOIN MCR 50 MG CA: 50 | 90 days supply | Qty: 90 | Fill #0

## 2020-09-10 MED FILL — BYDUREON BCise 2 MG/0.85ML: 2 | 84 days supply | Qty: 10 | Fill #1

## 2020-09-10 MED FILL — METAXALONE 800 MG TABS: 800 | 30 days supply | Qty: 90 | Fill #2

## 2020-09-13 MED FILL — PRALUENT 75 MG/ML SOAJ: 75 | 28 days supply | Qty: 2 | Fill #0

## 2020-09-20 ENCOUNTER — Other Ambulatory Visit: Payer: Self-pay | Admitting: Family Medicine

## 2020-09-20 MED FILL — LOSARTAN POTASSIUM 50 MG TA: 50 | 30 days supply | Qty: 30 | Fill #0

## 2020-09-26 DIAGNOSIS — M79672 Pain in left foot: Secondary | ICD-10-CM | POA: Diagnosis not present

## 2020-09-26 DIAGNOSIS — M7742 Metatarsalgia, left foot: Secondary | ICD-10-CM | POA: Diagnosis not present

## 2020-09-26 MED FILL — UBRELVY 50 MG TABS: 50 | 5 days supply | Qty: 10 | Fill #3

## 2020-10-01 DIAGNOSIS — M65332 Trigger finger, left middle finger: Secondary | ICD-10-CM | POA: Diagnosis not present

## 2020-10-01 DIAGNOSIS — M79642 Pain in left hand: Secondary | ICD-10-CM | POA: Diagnosis not present

## 2020-10-04 ENCOUNTER — Telehealth: Payer: Self-pay

## 2020-10-04 MED FILL — UBRELVY 50 MG TABS: 50 | 5 days supply | Qty: 10 | Fill #4

## 2020-10-04 NOTE — Telephone Encounter (Signed)
Initiated PA via cover my meds for Ubrelvy 50 mg tablets.   Medication PA has been approved 10/04/2020-10/03/2021.   Approval letter faxed to pharmacy. Patient made aware.

## 2020-10-08 ENCOUNTER — Other Ambulatory Visit: Payer: Self-pay | Admitting: Family Medicine

## 2020-10-08 ENCOUNTER — Ambulatory Visit: Payer: 59 | Admitting: Adult Health

## 2020-10-08 DIAGNOSIS — J3089 Other allergic rhinitis: Secondary | ICD-10-CM

## 2020-10-15 MED FILL — LOSARTAN POTASSIUM 50 MG TA: 50 | 30 days supply | Qty: 30 | Fill #1

## 2020-10-16 ENCOUNTER — Encounter: Payer: Self-pay | Admitting: Family Medicine

## 2020-10-17 ENCOUNTER — Other Ambulatory Visit: Payer: Self-pay | Admitting: Family Medicine

## 2020-10-17 ENCOUNTER — Other Ambulatory Visit: Payer: Self-pay

## 2020-10-17 MED ORDER — FLUCONAZOLE 150 MG PO TABS: 150.0000 mg | ORAL_TABLET | Freq: Once | ORAL | 0 refills | Status: DC

## 2020-10-17 MED FILL — FLUCONAZOLE 150 MG TABS: 150 | 42 days supply | Qty: 6 | Fill #0

## 2020-10-19 ENCOUNTER — Other Ambulatory Visit (HOSPITAL_BASED_OUTPATIENT_CLINIC_OR_DEPARTMENT_OTHER): Payer: Self-pay

## 2020-10-19 MED FILL — JANUVIA 100 MG TABLET: 100 | 30 days supply | Qty: 30 | Fill #2

## 2020-10-23 ENCOUNTER — Ambulatory Visit (INDEPENDENT_AMBULATORY_CARE_PROVIDER_SITE_OTHER): Payer: 59

## 2020-10-23 ENCOUNTER — Ambulatory Visit (INDEPENDENT_AMBULATORY_CARE_PROVIDER_SITE_OTHER): Payer: 59 | Admitting: Sports Medicine

## 2020-10-23 ENCOUNTER — Other Ambulatory Visit: Payer: Self-pay

## 2020-10-23 DIAGNOSIS — M25531 Pain in right wrist: Secondary | ICD-10-CM | POA: Diagnosis not present

## 2020-10-23 NOTE — Progress Notes (Signed)
    Procedures performed today:    None.  Independent interpretation of notes and tests performed by another provider:   None.  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 This typically occurs after lots of sewing. She does have a reproduction of pain with grinding and compression of the TFCC, no pain referable to the ECU tendon. Adding x-rays, she has a brace at home, home rehab exercises given, return to see me in 3 to 4 weeks, MR arthrography if no better. Holding off on NSAIDs as they do tend to precipitate her migraines.    ___________________________________________ Gwen Her. Dianah Field, M.D., ABFM., CAQSM. Primary Care and [place] Instructor of [place] of [facility] of Medicine

## 2020-10-23 NOTE — Assessment & Plan Note (Addendum)
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 This typically occurs after lots of sewing. She does have a reproduction of pain with grinding and compression of the TFCC, no pain referable to the ECU tendon. Adding x-rays, she has a brace at home, home rehab exercises given, return to see me in 3 to 4 weeks, MR arthrography if no better. Holding off on NSAIDs as they do tend to precipitate her migraines.

## 2020-10-24 ENCOUNTER — Encounter: Payer: 59 | Admitting: Sports Medicine

## 2020-10-24 ENCOUNTER — Other Ambulatory Visit: Payer: Self-pay | Admitting: Family Medicine

## 2020-10-24 MED FILL — FARXIGA 10 MG TABLET: 10 | 90 days supply | Qty: 90 | Fill #3

## 2020-10-25 MED FILL — TOPIRAMATE ER 100 MG CAP: 100 | 90 days supply | Qty: 90 | Fill #1

## 2020-10-30 ENCOUNTER — Other Ambulatory Visit (HOSPITAL_COMMUNITY): Payer: Self-pay

## 2020-11-03 ENCOUNTER — Other Ambulatory Visit (HOSPITAL_COMMUNITY): Payer: Self-pay

## 2020-11-05 ENCOUNTER — Encounter: Payer: Self-pay | Admitting: Family Medicine

## 2020-11-05 DIAGNOSIS — N952 Postmenopausal atrophic vaginitis: Secondary | ICD-10-CM

## 2020-11-06 ENCOUNTER — Other Ambulatory Visit (HOSPITAL_COMMUNITY): Payer: Self-pay

## 2020-11-06 MED ORDER — ESTRADIOL 0.1 MG/GM VA CREA
TOPICAL_CREAM | VAGINAL | 4 refills | Status: DC
  Filled 2020-11-06: qty 42.5, 30d supply, fill #0

## 2020-11-06 MED ORDER — CIPROFLOXACIN HCL 250 MG PO TABS
250.0000 mg | ORAL_TABLET | Freq: Two times a day (BID) | ORAL | 0 refills | Status: DC
Start: 2020-11-06 — End: 2020-12-30
  Filled 2020-11-06: qty 10, 5d supply, fill #0

## 2020-11-07 ENCOUNTER — Other Ambulatory Visit (HOSPITAL_COMMUNITY): Payer: Self-pay

## 2020-11-07 MED FILL — Zolpidem Tartrate Tab 5 MG: ORAL | 60 days supply | Qty: 30 | Fill #0 | Status: AC

## 2020-11-07 MED FILL — Alirocumab Subcutaneous Solution Auto-Injector 75 MG/ML: SUBCUTANEOUS | 28 days supply | Qty: 2 | Fill #0 | Status: AC

## 2020-11-09 ENCOUNTER — Other Ambulatory Visit (HOSPITAL_COMMUNITY): Payer: Self-pay

## 2020-11-12 ENCOUNTER — Ambulatory Visit: Payer: 59 | Admitting: Sports Medicine

## 2020-11-19 ENCOUNTER — Other Ambulatory Visit: Payer: Self-pay

## 2020-11-19 ENCOUNTER — Ambulatory Visit: Payer: 59 | Admitting: Sports Medicine

## 2020-11-19 DIAGNOSIS — M65311 Trigger thumb, right thumb: Secondary | ICD-10-CM | POA: Diagnosis not present

## 2020-11-19 DIAGNOSIS — M25531 Pain in right wrist: Secondary | ICD-10-CM | POA: Diagnosis not present

## 2020-11-19 NOTE — Addendum Note (Signed)
Addended by: Silverio Decamp on: 11/19/2020 11:31 AM   Modules accepted: Orders

## 2020-11-19 NOTE — Assessment & Plan Note (Addendum)
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 X-rays at the last visit showed widespread degenerative changes. The pain has improved to some degree, she feels like she can live with it now, she sleeps with a brace, at this point we will simply watch it. I will get her set up with occupational therapy for a custom molded wrist splint. The next step would be MR arthrography.

## 2020-11-19 NOTE — Assessment & Plan Note (Signed)
Debbie Hayes also has some pain and triggering in her right thumb, I can feel a palpable flexor tendon nodule, visible triggering. We will start with trigger thumb rehab exercises followed by a FPL tendon sheath injection with ultrasound guidance if insufficiently better after 6 weeks. Certainly if this fails we can have Dr. Amedeo Plenty weigh in.

## 2020-11-19 NOTE — Progress Notes (Addendum)
    Procedures performed today:    None.  Independent interpretation of notes and tests performed by another provider:   None.  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 X-rays at the last visit showed widespread degenerative changes. The pain has improved to some degree, she feels like she can live with it now, she sleeps with a brace, at this point we will simply watch it. I will get her set up with occupational therapy for a custom molded wrist splint. The next step would be MR arthrography.  Trigger thumb, right thumb Debbie Hayes also has some pain and triggering in her right thumb, I can feel a palpable flexor tendon nodule, visible triggering. We will start with trigger thumb rehab exercises followed by a FPL tendon sheath injection with ultrasound guidance if insufficiently better after 6 weeks. Certainly if this fails we can have Dr. Amedeo Plenty weigh in.    ___________________________________________ Gwen Her. Dianah Field, M.D., ABFM., CAQSM. Primary Care and [place] Instructor of [employer] of [facility] of Medicine

## 2020-11-22 ENCOUNTER — Ambulatory Visit: Payer: 59 | Admitting: Neurology

## 2020-12-04 ENCOUNTER — Ambulatory Visit (INDEPENDENT_AMBULATORY_CARE_PROVIDER_SITE_OTHER): Payer: 59

## 2020-12-04 ENCOUNTER — Other Ambulatory Visit: Payer: Self-pay

## 2020-12-04 ENCOUNTER — Ambulatory Visit: Payer: 59 | Admitting: Sports Medicine

## 2020-12-04 ENCOUNTER — Encounter: Payer: Self-pay | Admitting: Family Medicine

## 2020-12-04 ENCOUNTER — Other Ambulatory Visit (HOSPITAL_COMMUNITY): Payer: Self-pay

## 2020-12-04 DIAGNOSIS — M65311 Trigger thumb, right thumb: Secondary | ICD-10-CM

## 2020-12-04 MED FILL — Exenatide Extended Release Susp Auto-Injector 2 MG/0.85ML: SUBCUTANEOUS | 84 days supply | Qty: 10.2 | Fill #0 | Status: AC

## 2020-12-04 MED FILL — Alirocumab Subcutaneous Solution Auto-Injector 75 MG/ML: SUBCUTANEOUS | 28 days supply | Qty: 2 | Fill #1 | Status: AC

## 2020-12-04 NOTE — Assessment & Plan Note (Signed)
Debbie Hayes returns, she is a pleasant 70 year old female with a chronic right trigger thumb. She has done some conservative treatment, unfortunately she continues to have pain and triggering, this is failure of conservative treatment and not at ultimate goal for chronic disease process. Today I performed a flexor pollicis longus tendon sheath injection, return to see me in 4 weeks.

## 2020-12-04 NOTE — Progress Notes (Signed)
    Procedures performed today:    Procedure: Real-time Ultrasound Guided injection of the right FPL tendon sheath Device: Samsung HS60  Verbal informed consent obtained.  Time-out conducted.  Noted no overlying erythema, induration, or other signs of local infection.  Skin prepped in a sterile fashion.  Local anesthesia: Topical Ethyl chloride.  With sterile technique and under real time ultrasound guidance:  Noted slightly edematous tendon sheath, 1/2 cc lidocaine, 1/2 cc kenalog 40 injected easily.   Completed without difficulty  Advised to call if fevers/chills, erythema, induration, drainage, or persistent bleeding.  Images permanently stored and available for review in PACS.  Impression: Technically successful ultrasound guided injection.  Independent interpretation of notes and tests performed by another provider:   None.  Brief History, Exam, Impression, and Recommendations:    Trigger thumb, right thumb Debbie Hayes returns, she is a pleasant 70 year old female with a chronic right trigger thumb. She has done some conservative treatment, unfortunately she continues to have pain and triggering, this is failure of conservative treatment and not at ultimate goal for chronic disease process. Today I performed a flexor pollicis longus tendon sheath injection, return to see me in 4 weeks.    ___________________________________________ Gwen Her. Dianah Field, M.D., ABFM., CAQSM. Primary Care and [place] Instructor of [place] of [facility] of Medicine

## 2020-12-05 ENCOUNTER — Other Ambulatory Visit (HOSPITAL_COMMUNITY): Payer: Self-pay

## 2020-12-05 MED ORDER — ZOLPIDEM TARTRATE 5 MG PO TABS
5.0000 mg | ORAL_TABLET | Freq: Every evening | ORAL | 1 refills | Status: DC | PRN
  Filled 2020-12-05 – 2020-12-12 (×2): qty 90, 90d supply, fill #0

## 2020-12-05 NOTE — Addendum Note (Signed)
Addended by: Lamar Blinks C on: 12/05/2020 12:53 PM   Modules accepted: Orders

## 2020-12-10 ENCOUNTER — Ambulatory Visit: Payer: 59 | Admitting: Sports Medicine

## 2020-12-12 ENCOUNTER — Other Ambulatory Visit (HOSPITAL_COMMUNITY): Payer: Self-pay

## 2020-12-12 MED FILL — Levothyroxine Sodium Tab 125 MCG: ORAL | 90 days supply | Qty: 90 | Fill #0 | Status: AC

## 2020-12-18 ENCOUNTER — Other Ambulatory Visit (HOSPITAL_COMMUNITY): Payer: Self-pay

## 2020-12-18 MED FILL — Ubrogepant Tab 50 MG: ORAL | 20 days supply | Qty: 10 | Fill #0 | Status: AC

## 2020-12-18 MED FILL — Metoprolol Succinate Tab ER 24HR 50 MG (Tartrate Equiv): ORAL | 90 days supply | Qty: 90 | Fill #0 | Status: AC

## 2020-12-18 MED FILL — Sitagliptin Phosphate Tab 100 MG (Base Equiv): ORAL | 30 days supply | Qty: 30 | Fill #0 | Status: AC

## 2020-12-18 MED FILL — Losartan Potassium Tab 50 MG: ORAL | 90 days supply | Qty: 90 | Fill #0 | Status: AC

## 2020-12-25 ENCOUNTER — Other Ambulatory Visit (HOSPITAL_COMMUNITY): Payer: Self-pay

## 2020-12-25 ENCOUNTER — Other Ambulatory Visit: Payer: Self-pay | Admitting: Family Medicine

## 2020-12-25 DIAGNOSIS — E119 Type 2 diabetes mellitus without complications: Secondary | ICD-10-CM

## 2020-12-25 MED ORDER — DAPAGLIFLOZIN PROPANEDIOL 10 MG PO TABS
ORAL_TABLET | Freq: Every day | ORAL | 3 refills | Status: DC
  Filled 2020-12-25: qty 90, 90d supply, fill #0

## 2020-12-25 MED FILL — Alirocumab Subcutaneous Solution Auto-Injector 75 MG/ML: SUBCUTANEOUS | 28 days supply | Qty: 2 | Fill #2 | Status: CN

## 2020-12-27 ENCOUNTER — Ambulatory Visit: Payer: 59 | Admitting: Medical

## 2020-12-28 ENCOUNTER — Other Ambulatory Visit (HOSPITAL_COMMUNITY)
Admission: RE | Admit: 2020-12-28 | Discharge: 2020-12-28 | Disposition: A | Discharge status: Home / Self Care | Payer: Medicare Other | Source: Ambulatory Visit | Attending: Medical | Admitting: Medical

## 2020-12-28 ENCOUNTER — Other Ambulatory Visit: Payer: Self-pay

## 2020-12-28 ENCOUNTER — Ambulatory Visit (INDEPENDENT_AMBULATORY_CARE_PROVIDER_SITE_OTHER): Payer: Medicare Other | Admitting: Medical

## 2020-12-28 VITALS — BP 137/70 | HR 72 | Resp 16 | Ht 68.0 in | Wt 180.2 lb

## 2020-12-28 DIAGNOSIS — R350 Frequency of micturition: Secondary | ICD-10-CM

## 2020-12-28 DIAGNOSIS — R059 Cough, unspecified: Secondary | ICD-10-CM | POA: Diagnosis not present

## 2020-12-28 DIAGNOSIS — N898 Other specified noninflammatory disorders of vagina: Secondary | ICD-10-CM

## 2020-12-28 DIAGNOSIS — J3089 Other allergic rhinitis: Secondary | ICD-10-CM

## 2020-12-28 DIAGNOSIS — R8290 Unspecified abnormal findings in urine: Secondary | ICD-10-CM | POA: Insufficient documentation

## 2020-12-28 LAB — POC URINALSYSI DIPSTICK (AUTOMATED)
Bilirubin, UA: NEGATIVE
Blood, UA: NEGATIVE
Glucose, UA: POSITIVE — AB
Ketones, UA: NEGATIVE
Leukocytes, UA: NEGATIVE
Nitrite, UA: NEGATIVE
Protein, UA: NEGATIVE
Spec Grav, UA: 1.02 (ref 1.010–1.025)
Urobilinogen, UA: 0.2 E.U./dL
pH, UA: 5 (ref 5.0–8.0)

## 2020-12-28 MED ORDER — AZELASTINE HCL 0.1 % NA SOLN: 2.0000 | Freq: Two times a day (BID) | NASAL | 12 refills | Status: DC

## 2020-12-28 NOTE — Patient Instructions (Addendum)
Frequent urination with odor to to urine. Hx of uti in the past. Urine does not look suspicious for uti by ua. Will send out urine for culture. Also decided to order urine ancillary studies to include yeast and bv. Pending studies hydrate well and can use pyridium. If studies are negative and symptoms persist then refer to urologist for evaluation of possible IC.  For cough considering combination of allergic rhintis and reflux. Continue current allergy meds and famotadine. Adding astelin to see if this helps. If cough persist can consider cxr. Last one done in 2018 normal.   Follow up in 10-14 day or as needed.

## 2020-12-28 NOTE — Progress Notes (Signed)
Subjective:    Patient ID: Debbie Hayes, female    DOB: May 02, 1951, 70 y.o.   MRN: [id number]  HPI Pt in for some recent vaginal irritation, urgency sensation but no frequency. No back pain. Pt is considering that she may have IC. Pt urine looks clear. Pt checks her urine on occasional. She does check and gets sees leukocytes on occasion.  Pt about one weed  ago thought she had yeast infection. She took 2 tabs of diflucan about 10 days ago.  Pt is diabetic. Pt is on [place], farxiga and bydureon.  Sugar 170  3 days.    Chronic cough for years. Pt has allergies. Use flonase, claritin and Montelukast.  Pt has some gerd. She is on pepcid.  Pt has hx of some exercise induced wheezing.     Review of Systems  Constitutional: Negative for chills, fatigue and fever.  Respiratory: Negative for cough, chest tightness, shortness of breath and wheezing.   Cardiovascular: Negative for chest pain and palpitations.  Gastrointestinal: Negative for abdominal pain.  Genitourinary: Positive for frequency.  Musculoskeletal: Negative for back pain.  Neurological: Negative for dizziness, speech difficulty, weakness and light-headedness.  Hematological: Negative for adenopathy. Does not bruise/bleed easily.  Psychiatric/Behavioral: Negative for behavioral problems and confusion.    Past Medical History:  Diagnosis Date  . Allergy   . Cancer [facility])    thyroid cancer  . Cataract   . Chronic migraine BOTOX INJECTION EVERY 3 MONTHS  . Chronic pain in right shoulder   . Constipation   . Diabetes mellitus   . Disorder of inner ear CAUSES VERTIGO OCCASIONALLY  . Fibromyalgia   . GERD (gastroesophageal reflux disease)   . Hemorrhoid   . History of bladder infections   . History of thyroid cancer 2009  S/P TOTAL THYROIDECTOMY AND RADIATION  . Hyperlipidemia   . Hypertension CARDIOLOGIST- DR Wynonia Lawman- WILL REQUEST LATE NOTE   DENIES S & S  . Hypothyroidism   . Insomnia   . Left shoulder  pain   . Macular degeneration   . MRSA infection    nasal treated more than 15 years ago  . Normal nuclear stress test 01-25-2009  . OA (osteoarthritis) JOINTS  . Osteopenia   . Painful orthopaedic hardware ([place])    left foot  . PONV (postoperative nausea and vomiting) SEVERE  . Rotator cuff disorder LEFT SHOULDER RTC IMPINGMENT  . Sleep apnea    mouth guard, no cpap   . Sleep apnea in adult 06/26/2017  . Spondylolisthesis of lumbar region   . 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 Wears glasses      Social History   Socioeconomic History  . Marital status: Married    Spouse name: Not on file  . Number of children: Not on file  . Years of education: Not on file  . Highest education level: Not on file  Occupational History    Comment: retired Therapist, sports  Tobacco Use  . Smoking status: Never Smoker  . Smokeless tobacco: Never Used  Vaping Use  . Vaping Use: Never used  Substance and Sexual Activity  . Alcohol use: Yes    Comment: RARE  . Drug use: No  . Sexual activity: Not on file  Other Topics Concern  . Not on file  Social History Narrative   Lives with husband Dr Ruben Reason.   They have 2 adult children that live out of state.   Social Determinants of Health   Financial  Resource Strain: Not on file  Food Insecurity: Not on file  Transportation Needs: Not on file  Physical Activity: Not on file  Stress: Not on file  Social Connections: Not on file  Intimate Partner Violence: Not on file    Past Surgical History:  Procedure Laterality Date  . BACK SURGERY     Dr Arnoldo Morale- spondylosis  . BILATERAL CARPAL TUNNEL RELEASE  1994  . BILATERAL ELBOW SURG.  1999  . BILATERAL SALPINGOOPHECTOMY  1993   POST-OP URETER REPAIR 12 DAYS AFTER   . CATARACT EXTRACTION, BILATERAL    . COLONOSCOPY    . EYE SURGERY    . FOOT ARTHRODESIS Left 07/10/2016   Procedure: LEFT 2ND TARSAL METATARSAL ARTHRODESIS  GASTROC RECESSION LEFT LAPIDUS MODIFIED MCBRIDE BUIONECTOMY;  Surgeon:  Wylene Simmer, MD;  Location: [facility];  Service: Orthopedics;  Laterality: Left;  Marland Kitchen GASTROC RECESSION EXTREMITY Left 07/10/2016   Procedure: GASTROC RECESSION EXTREMITY;  Surgeon: Wylene Simmer, MD;  Location: [facility];  Service: Orthopedics;  Laterality: Left;  . HARDWARE REMOVAL Left 05/20/2018   Procedure: Left foot removal of deep implants;  Surgeon: Wylene Simmer, MD;  Location: [facility];  Service: Orthopedics;  Laterality: Left;  23mn  . LEFT THUMB JOINT REPLACEMENT  2005   RIGHT DONE IN 2004  . OOPHORECTOMY    . POLYPECTOMY    . RIGHT KNEE ARTHROSCOPY  X2  BEFORE 2011  . RIGHT KNEE ARTHROSCOPY/ PARTIAL LATERAL MENISECTOMY/ TRICOMPARTMENT CHONDROPLASTY/ DECOMPRESSION CYST  10-26-2009  . RIGHT KNEE CLOSED MANIPULATION  09-11-2010  . RIGHT SHOULDER ARTHROSCOPY  2010  &  2004  . TIBIA CYST REMOVED AND ORIF LEG FX  1958  . TONSILLECTOMY  1968  . TOTAL KNEE ARTHROPLASTY  07-16-2010   RIGHT  . TOTAL THYROIDECTOMY  2009   CANCER  (POST-OP BLEED)  AND RADIATION TX  . trigger fingers Right 2013   3rd and 4th fingers  . UPPER GASTROINTESTINAL ENDOSCOPY    . VAGINAL HYSTERECTOMY  1990    Family History  Problem Relation Age of Onset  . Colon polyps Sister 527 . Alzheimer's disease Mother   . Heart disease Father   . Cirrhosis Father   . Heart attack Father   . Macular degeneration Father   . Colon cancer Neg Hx     Allergies  Allergen Reactions  . Statins Other (See Comments)    Pt has tried multiple and cannot tolerate  . Sulfa Antibiotics Hives    Current Outpatient Medications on File Prior to Visit  Medication Sig Dispense Refill  . Alirocumab 75 MG/ML SOAJ INJECT 75 MG INTO THE SKIN EVERY 14 DAYS. 2 mL 5  . blood glucose meter kit and supplies KIT Test blood sugar once daily. Dx code: E11.9 1 each 0  . Blood Glucose Monitoring Suppl (FREESTYLE LITE) DEVI Check blood sugar no more than twice daily 1 each 0  . ciprofloxacin  (CIPRO) 250 MG tablet Take 1 tablet (250 mg total) by mouth 2 (two) times daily. 10 tablet 0  . dapagliflozin propanediol (FARXIGA) 10 MG TABS tablet TAKE 1 TABLET BY MOUTH ONCE A DAY 90 tablet 3  . esomeprazole (NEXIUM) 40 MG capsule Take 40 mg by mouth daily as needed (acid reflux).     .Marland Kitchenestradiol (ESTRACE VAGINAL) 0.1 MG/GM vaginal cream Apply one gram vaginally 1-3 times a week as needed (Patient taking differently: Apply one gram vaginally 1-3 times a week as needed) 42.5 g  4  . Exenatide ER 2 MG/0.85ML AUIJ INJECT 2 MG INTO THE SKIN ONCE A WEEK. 7.65 mL 12  . gabapentin (NEURONTIN) 300 MG capsule TAKE 1 CAPSULE (300 MG TOTAL) BY MOUTH AT BEDTIME. 90 capsule 1  . glucose blood (FREESTYLE LITE) test strip CHECK BLOOD SUGARS NO MORE THAN TWICE DAILY 100 each 12  . Lancets (FREESTYLE) lancets Check blood sugars no more than twice daily 100 each 12  . levothyroxine (SYNTHROID) 125 MCG tablet TAKE 1 TABLET BY MOUTH DAILY BEFORE BREAKFAST. **NEED APPOINTMENT FOR FURTHER REFILLS** 90 tablet 3  . losartan (COZAAR) 50 MG tablet TAKE 1 TABLET BY MOUTH DAILY. 90 tablet 1  . metaxalone (SKELAXIN) 800 MG tablet TAKE 1 TABLET BY MOUTH 3 TIMES DAILY 90 tablet 3  . metoprolol succinate (TOPROL-XL) 50 MG 24 hr tablet TAKE 1 TABLET BY MOUTH DAILY. TAKE WITH OR IMMEDIATELY FOLLOWING A MEAL 90 tablet 1  . montelukast (SINGULAIR) 10 MG tablet TAKE 1 TABLET BY MOUTH AT BEDTIME 90 tablet 3  . nitrofurantoin (MACRODANTIN) 50 MG capsule TAKE 1 CAPSULE BY MOUTH AT BEDTIME 90 capsule 3  . Pseudoephedrine-DM-GG (ROBITUSSIN CF PO) Take 2 capsules by mouth daily.    . sitaGLIPtin (JANUVIA) 100 MG tablet TAKE 1 TABLET BY MOUTH DAILY. 90 tablet 1  . topiramate ER (QUDEXY XR) 100 MG CS24 sprinkle capsule TAKE 1 CAPSULE BY MOUTH AT BEDTIME 90 capsule 3  . Ubrogepant 50 MG TABS TAKE 1 TABLET BY MOUTH AS NEEDED (MAX 200 MG DAILY) 10 tablet 5  . zolpidem (AMBIEN) 5 MG tablet Take 1 tablet (5 mg total) by mouth at bedtime as  needed for sleep. 90 tablet 1  . traZODone (DESYREL) 100 MG tablet TAKE 2 AND 1/2 TABLETS BY MOUTH AT BEDTIME 225 tablet 3   No current facility-administered medications on file prior to visit.    BP 137/70   Pulse 72   Resp 16   Ht _0  (1.727 m)   Wt 180 lb 3.2 oz (81.7 kg)   SpO2 97%   BMI 27.40 kg/m       Objective:   Physical Exam  General- No acute distress. Pleasant patient. Neck- Full range of motion, no jvd Lungs- Clear, even and unlabored. Heart- regular rate and rhythm. Neurologic- CNII- XII grossly intact. Abdomen- soft, nt, nt, +bs, no rebound or guarding. Back- no cva tenderness. heent- no sinus pressure. Posterior pharynx +pnd.      Assessment & Plan:  Frequent urination with odor to to urine. Hx of uti in the past. Urine does not look suspicious for uti by ua. Will send out urine for culture. Also decided to order urine ancillary studies to include yeast and bv. Pending studies hydrate well and can use pyridium. If studies are negative and symptoms persist then refer to urologist for evaluation of possible IC.  For cough considering combination of allergic rhintis and reflux. Continue current allergy meds and famotadine. Adding astelin to see if this helps. If cough persist can consider cxr. Last one done in 2018 normal.   Follow up in 10-14 day or as needed.

## 2020-12-29 ENCOUNTER — Other Ambulatory Visit (HOSPITAL_COMMUNITY): Payer: Self-pay

## 2020-12-30 LAB — URINE CULTURE
MICRO NUMBER:: 11966485
SPECIMEN QUALITY:: ADEQUATE

## 2020-12-30 MED ORDER — CIPROFLOXACIN HCL 500 MG PO TABS: 500.0000 mg | ORAL_TABLET | Freq: Two times a day (BID) | ORAL | 0 refills | Status: DC

## 2020-12-30 NOTE — Addendum Note (Signed)
Addended by: Anabel Halon on: 12/30/2020 05:07 PM   Modules accepted: Orders

## 2020-12-31 ENCOUNTER — Ambulatory Visit: Payer: 59 | Admitting: Family Medicine

## 2020-12-31 NOTE — Progress Notes (Deleted)
[facility] [address] [place], [place] [address] [phone]  Date:  01/02/2021   Name:  Debbie Hayes   DOB:  May 01, 1951   MRN:  [id number]  PCP:  Darreld Mclean, MD    Chief Complaint: No chief complaint on file.   History of Present Illness:  Debbie Hayes is a 70 y.o. very pleasant female patient who presents with the following:  Here today with concern about bladder symptoms She was seen by Percell Miller on May 3 with concern of urinary frequency and odor Her urine culture did come back positive for Klebsiella She has had several UTIs recently.  We started her on a topical vaginal estrogen to see if this might be helpful in April She is status post hysterectomy  Shingrix Can give additional dose of pneumonia if desired COVID-19 booster Foot exam due Eye exam Lab Results  Component Value Date   HGBA1C 7.4 (H) 08/30/2020    Patient Active Problem List   Diagnosis Date Noted  . Trigger thumb, right thumb 11/19/2020  . Right wrist pain 10/23/2020  . Osteoporosis 04/05/2020  . Trigger finger, left middle finger 07/27/2019  . Chronic neck pain 11/10/2018  . Orthopedic hardware present 02/17/2018  . Diabetes mellitus due to underlying condition with complication ([place]) 97/98/9211  . 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 Arthralgia 12/15/2017  . Joint pain 12/15/2017  . Migraine, transformed 12/15/2017  . 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 Pain of left shoulder joint on movement 10/19/2017  . OSA (obstructive sleep apnea) 06/26/2017  . Snoring 03/25/2016  . Palpitations 03/25/2016  . Headache 03/25/2016  . Sensory disorder of trigeminal nerve 11/26/2015  . Spondylolisthesis of lumbar region 08/09/2014  . Lumbar adjacent segment disease with spondylolisthesis 08/09/2014  . Back pain 02/17/2014  . Macular degeneration 02/17/2014  .  Backache 02/17/2014  . Other and unspecified hyperlipidemia 10/14/2013  . Hyperlipidemia 10/14/2013  . Onychomycosis 12/01/2012  . Arthritis 04/12/2012  . Hypothyroidism 04/12/2012    Past Medical History:  Diagnosis Date  . Allergy   . Cancer [facility])    thyroid cancer  . Cataract   . Chronic migraine BOTOX INJECTION EVERY 3 MONTHS  . Chronic pain in right shoulder   . Constipation   . Diabetes mellitus   . Disorder of inner ear CAUSES VERTIGO OCCASIONALLY  . Fibromyalgia   . GERD (gastroesophageal reflux disease)   . Hemorrhoid   . History of bladder infections   . History of thyroid cancer 2009  S/P TOTAL THYROIDECTOMY AND RADIATION  . Hyperlipidemia   . Hypertension CARDIOLOGIST- DR Wynonia Lawman- WILL REQUEST LATE NOTE   DENIES S & S  . Hypothyroidism   . Insomnia   . Left shoulder pain   . Macular degeneration   . MRSA infection    nasal treated more than 15 years ago  . Normal nuclear stress test 01-25-2009  . OA (osteoarthritis) JOINTS  . Osteopenia   . Painful orthopaedic hardware ([place])    left foot  . PONV (postoperative nausea and vomiting) SEVERE  . Rotator cuff disorder LEFT SHOULDER RTC IMPINGMENT  . Sleep apnea    mouth guard, no cpap   . Sleep apnea in adult 06/26/2017  . Spondylolisthesis of lumbar region   . 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 Wears glasses     Past Surgical History:  Procedure Laterality Date  .  BACK SURGERY     Dr Arnoldo Morale- spondylosis  . BILATERAL CARPAL TUNNEL RELEASE  1994  . BILATERAL ELBOW SURG.  1999  . BILATERAL SALPINGOOPHECTOMY  1993   POST-OP URETER REPAIR 12 DAYS AFTER   . CATARACT EXTRACTION, BILATERAL    . COLONOSCOPY    . EYE SURGERY    . FOOT ARTHRODESIS Left 07/10/2016   Procedure: LEFT 2ND TARSAL METATARSAL ARTHRODESIS  GASTROC RECESSION LEFT LAPIDUS MODIFIED MCBRIDE BUIONECTOMY;  Surgeon: Wylene Simmer, MD;  Location: [facility];  Service: Orthopedics;  Laterality: Left;  Marland Kitchen GASTROC RECESSION  EXTREMITY Left 07/10/2016   Procedure: GASTROC RECESSION EXTREMITY;  Surgeon: Wylene Simmer, MD;  Location: [facility];  Service: Orthopedics;  Laterality: Left;  . HARDWARE REMOVAL Left 05/20/2018   Procedure: Left foot removal of deep implants;  Surgeon: Wylene Simmer, MD;  Location: [facility];  Service: Orthopedics;  Laterality: Left;  68min  . LEFT THUMB JOINT REPLACEMENT  2005   RIGHT DONE IN 2004  . OOPHORECTOMY    . POLYPECTOMY    . RIGHT KNEE ARTHROSCOPY  X2  BEFORE 2011  . RIGHT KNEE ARTHROSCOPY/ PARTIAL LATERAL MENISECTOMY/ TRICOMPARTMENT CHONDROPLASTY/ DECOMPRESSION CYST  10-26-2009  . RIGHT KNEE CLOSED MANIPULATION  09-11-2010  . RIGHT SHOULDER ARTHROSCOPY  2010  &  2004  . TIBIA CYST REMOVED AND ORIF LEG FX  1958  . TONSILLECTOMY  1968  . TOTAL KNEE ARTHROPLASTY  07-16-2010   RIGHT  . TOTAL THYROIDECTOMY  2009   CANCER  (POST-OP BLEED)  AND RADIATION TX  . trigger fingers Right 2013   3rd and 4th fingers  . UPPER GASTROINTESTINAL ENDOSCOPY    . VAGINAL HYSTERECTOMY  1990    Social History   Tobacco Use  . Smoking status: Never Smoker  . Smokeless tobacco: Never Used  Vaping Use  . Vaping Use: Never used  Substance Use Topics  . Alcohol use: Yes    Comment: RARE  . Drug use: No    Family History  Problem Relation Age of Onset  . Colon polyps Sister 77  . Alzheimer's disease Mother   . Heart disease Father   . Cirrhosis Father   . Heart attack Father   . Macular degeneration Father   . Colon cancer Neg Hx     Allergies  Allergen Reactions  . Statins Other (See Comments)    Pt has tried multiple and cannot tolerate  . Sulfa Antibiotics Hives    Medication list has been reviewed and updated.  Current Outpatient Medications on File Prior to Visit  Medication Sig Dispense Refill  . Alirocumab 75 MG/ML SOAJ INJECT 75 MG INTO THE SKIN EVERY 14 DAYS. 2 mL 5  . azelastine (ASTELIN) 0.1 % nasal spray Place 2 sprays into both  nostrils 2 (two) times daily. Use in each nostril as directed 30 mL 12  . blood glucose meter kit and supplies KIT Test blood sugar once daily. Dx code: E11.9 1 each 0  . Blood Glucose Monitoring Suppl (FREESTYLE LITE) DEVI Check blood sugar no more than twice daily 1 each 0  . ciprofloxacin (CIPRO) 500 MG tablet Take 1 tablet (500 mg total) by mouth 2 (two) times daily. 14 tablet 0  . dapagliflozin propanediol (FARXIGA) 10 MG TABS tablet TAKE 1 TABLET BY MOUTH ONCE A DAY 90 tablet 3  . esomeprazole (NEXIUM) 40 MG capsule Take 40 mg by mouth daily as needed (acid reflux).     Marland Kitchen estradiol (  ESTRACE VAGINAL) 0.1 MG/GM vaginal cream Apply one gram vaginally 1-3 times a week as needed (Patient taking differently: Apply one gram vaginally 1-3 times a week as needed) 42.5 g 4  . Exenatide ER 2 MG/0.85ML AUIJ INJECT 2 MG INTO THE SKIN ONCE A WEEK. 7.65 mL 12  . gabapentin (NEURONTIN) 300 MG capsule TAKE 1 CAPSULE (300 MG TOTAL) BY MOUTH AT BEDTIME. 90 capsule 1  . glucose blood (FREESTYLE LITE) test strip CHECK BLOOD SUGARS NO MORE THAN TWICE DAILY 100 each 12  . Lancets (FREESTYLE) lancets Check blood sugars no more than twice daily 100 each 12  . levothyroxine (SYNTHROID) 125 MCG tablet TAKE 1 TABLET BY MOUTH DAILY BEFORE BREAKFAST. **NEED APPOINTMENT FOR FURTHER REFILLS** 90 tablet 3  . losartan (COZAAR) 50 MG tablet TAKE 1 TABLET BY MOUTH DAILY. 90 tablet 1  . metaxalone (SKELAXIN) 800 MG tablet TAKE 1 TABLET BY MOUTH 3 TIMES DAILY 90 tablet 3  . metoprolol succinate (TOPROL-XL) 50 MG 24 hr tablet TAKE 1 TABLET BY MOUTH DAILY. TAKE WITH OR IMMEDIATELY FOLLOWING A MEAL 90 tablet 1  . montelukast (SINGULAIR) 10 MG tablet TAKE 1 TABLET BY MOUTH AT BEDTIME 90 tablet 3  . nitrofurantoin (MACRODANTIN) 50 MG capsule TAKE 1 CAPSULE BY MOUTH AT BEDTIME 90 capsule 3  . Pseudoephedrine-DM-GG (ROBITUSSIN CF PO) Take 2 capsules by mouth daily.    . sitaGLIPtin (JANUVIA) 100 MG tablet TAKE 1 TABLET BY MOUTH DAILY.  90 tablet 1  . topiramate ER (QUDEXY XR) 100 MG CS24 sprinkle capsule TAKE 1 CAPSULE BY MOUTH AT BEDTIME 90 capsule 3  . traZODone (DESYREL) 100 MG tablet TAKE 2 AND 1/2 TABLETS BY MOUTH AT BEDTIME 225 tablet 3  . Ubrogepant 50 MG TABS TAKE 1 TABLET BY MOUTH AS NEEDED (MAX 200 MG DAILY) 10 tablet 5  . zolpidem (AMBIEN) 5 MG tablet Take 1 tablet (5 mg total) by mouth at bedtime as needed for sleep. 90 tablet 1   No current facility-administered medications on file prior to visit.    Review of Systems:  ***  Physical Examination: There were no vitals filed for this visit. There were no vitals filed for this visit. There is no height or weight on file to calculate BMI. Ideal Body Weight:    ***  Assessment and Plan: ***  Signed Lamar Blinks, MD

## 2021-01-01 ENCOUNTER — Ambulatory Visit: Payer: 59 | Admitting: Sports Medicine

## 2021-01-02 ENCOUNTER — Ambulatory Visit: Payer: 59 | Admitting: Family Medicine

## 2021-01-03 LAB — URINE CYTOLOGY ANCILLARY ONLY: Bacterial Vaginitis-Urine: NEGATIVE

## 2021-01-15 ENCOUNTER — Telehealth: Payer: Self-pay | Admitting: Neurology

## 2021-01-15 NOTE — Telephone Encounter (Signed)
Pt called, changed to Medicare from [facility]. Going to cost me $400 for topiramate ER (QUDEXY XR) 100 MG CS24 sprinkle capsule. What other medication can I take for migraines. Would like a call from the nurse.

## 2021-01-16 NOTE — Telephone Encounter (Signed)
Called the patient back.  Advised that Dr Jaynee Eagles would recommend going back to the IR version of the medication. She would order Topiramate 100 mg IR at bedtime.  She states that she has some that was ordered previously prior to starting the ER form and she will start with taking that. In the meantime advised we can always attempt to c/o PA for the ER to see what the cost would be for the pt with the new insurance. If still too much then we would proceed with ordering the IR form.  She is currently using [facility] on [address] in [place] and is asking if we make sure to update the pharmacy. She will upload a copy of the insurance card on mychart and we can attempt a PA for the ER first.

## 2021-01-17 ENCOUNTER — Encounter: Payer: Self-pay | Admitting: Family Medicine

## 2021-01-17 ENCOUNTER — Other Ambulatory Visit (HOSPITAL_COMMUNITY): Payer: Self-pay

## 2021-01-17 DIAGNOSIS — G43711 Chronic migraine without aura, intractable, with status migrainosus: Secondary | ICD-10-CM

## 2021-01-17 DIAGNOSIS — G4700 Insomnia, unspecified: Secondary | ICD-10-CM

## 2021-01-17 MED ORDER — METOPROLOL SUCCINATE ER 50 MG PO TB24: ORAL_TABLET | Freq: Every day | ORAL | 3 refills | Status: DC

## 2021-01-17 MED ORDER — UBROGEPANT 50 MG PO TABS: ORAL_TABLET | ORAL | 5 refills | Status: DC

## 2021-01-17 MED ORDER — TRAZODONE HCL 100 MG PO TABS: ORAL_TABLET | ORAL | 3 refills | Status: DC

## 2021-01-21 ENCOUNTER — Encounter: Payer: Self-pay | Admitting: Family Medicine

## 2021-01-21 DIAGNOSIS — E782 Mixed hyperlipidemia: Secondary | ICD-10-CM

## 2021-01-22 MED ORDER — REPATHA 140 MG/ML ~~LOC~~ SOSY: 140.0000 mg | PREFILLED_SYRINGE | SUBCUTANEOUS | 3 refills | Status: DC

## 2021-01-22 NOTE — Addendum Note (Signed)
Addended by: Darreld Mclean on: 01/22/2021 06:38 AM   Modules accepted: Orders

## 2021-01-24 NOTE — Telephone Encounter (Signed)
Pt called to further discuss her medications and the prices that they are now.

## 2021-01-25 ENCOUNTER — Telehealth: Payer: Self-pay | Admitting: Neurology

## 2021-01-25 NOTE — Telephone Encounter (Signed)
Pt called back stating her [place] will not cover her dapagliflozin propanediol (FARXIGA) 10 MG TABS tablet, will cost $400 out of pocket. Wants to know is there another medication that  the doctor can prescribe.

## 2021-01-25 NOTE — Telephone Encounter (Signed)
Pt called and scheduled an appt and is wanting to know if her topiramate ER (QUDEXY XR) 100 MG CS24 sprinkle capsule can be filled but she would like the reg version called in not the ER one. Please advise.

## 2021-01-27 MED ORDER — LINAGLIPTIN 5 MG PO TABS: 5.0000 mg | ORAL_TABLET | Freq: Every day | ORAL | 6 refills | Status: DC

## 2021-01-27 NOTE — Addendum Note (Signed)
Addended by: Lamar Blinks C on: 01/27/2021 03:02 AM   Modules accepted: Orders

## 2021-01-29 ENCOUNTER — Other Ambulatory Visit (HOSPITAL_COMMUNITY): Payer: Self-pay

## 2021-01-29 MED ORDER — TOPIRAMATE 100 MG PO TABS
100.0000 mg | ORAL_TABLET | Freq: Every day | ORAL | 3 refills | Status: DC
  Filled 2021-01-29: qty 30, 30d supply, fill #0

## 2021-01-29 NOTE — Telephone Encounter (Signed)
Will discuss potential change of Topiramate from ER to IR with Dr Jaynee Eagles.

## 2021-01-29 NOTE — Telephone Encounter (Signed)
Per Dr Jaynee Eagles, ok to switch from Topiramate ER to Topiramate 100 mg PO daily.

## 2021-01-29 NOTE — Telephone Encounter (Signed)
Spoke with patient. She appreciates the prescription for Topiramate IR 100 mg at bedtime (order sent to [facility]).  Patient aware that per chart history, Dr. Jaynee Eagles has never prescribed Farxiga before, nor metformin which she was on before the Farxiga.  Patient was advised to contact her primary care to discuss this. She verbalized understanding and appreciation.  Qudexy canceled and Topiramate IR ordered.

## 2021-01-29 NOTE — Telephone Encounter (Signed)
Debbie Hayes is actually prescribed by pt's primary care. She will need to discuss with primary care office.

## 2021-01-31 MED ORDER — TOPIRAMATE 100 MG PO TABS: 100.0000 mg | ORAL_TABLET | Freq: Every day | ORAL | 3 refills | Status: DC

## 2021-01-31 NOTE — Telephone Encounter (Signed)
done

## 2021-01-31 NOTE — Telephone Encounter (Signed)
Pt has called to report that with her now being on Medicare she needs the topiramate (TOPAMAX) 100 MG tablet sent to [facility] #[id number] .  She is aware it has already been sent to [facility]

## 2021-01-31 NOTE — Addendum Note (Signed)
Addended by: Gildardo Griffes on: 01/31/2021 01:53 PM   Modules accepted: Orders

## 2021-02-01 ENCOUNTER — Encounter: Payer: Self-pay | Admitting: Family Medicine

## 2021-02-01 ENCOUNTER — Other Ambulatory Visit (HOSPITAL_COMMUNITY): Payer: Self-pay

## 2021-02-05 ENCOUNTER — Ambulatory Visit: Payer: Medicare Other | Admitting: Medical

## 2021-02-09 NOTE — Progress Notes (Addendum)
[facility] [place], [place], [place] [address] [phone]  Date:  02/11/2021   Name:  Debbie Hayes   DOB:  [birth year]   MRN:  [id number]  PCP:  Darreld Mclean, MD    Chief Complaint: Diarrhea (Off and on) and Urinary Tract Infection (Possible )   History of Present Illness:  Debbie Hayes is a 70 y.o. very pleasant female patient who presents with the following:  Pt seen today for a follow-up visit- history of migraine headache, sleep apnea, diabetes, hypothyroidism, hyperlipidemia Last seen by myself in February of this year  She recently started medicare and we have needed to sub a few of her medications due to coverage change  Her husband Debbie Hayes is in [place] on a medical mission- he has been out of town for about a month now.  Debbie Hayes admits she is feeling a little bit lonely and downcast,.  She has been in charge of Debbie Hayes (their adult daughter who has Down syndrome ) on her own following Debbie Hayes's recent knee surgery which has been a challenge  She was able to get Repatha and has tried it- tolerated fine and only feels that insurance coverage may also be a problem  She notes that her am glucose is about 180 She was on farxiga but had to stop due to cost She is using bydurion but is not currently taking an SGLT2 inhibitor, because as above Debbie Hayes was too expensive We will try jardiance for her -this appears to be tier 3 or [place] wonders if she may have a UTI again.  She has these frequently and it can be hard to tell when she has infection.  We will certainly culture her urine    Lab Results  Component Value Date   HGBA1C 7.1 (H) 02/11/2021   Lab Results  Component Value Date   TSH 2.65 02/11/2021    Shingrix Covid 4th dose Foot exam Eye exam  Finally, she notes concern of intermittent diarrhea.  She has noted diarrhea for one day 1-2 per month for the last couple of years It we  will start with about 1 day of stomach discomfort and churning. She then may have moderate to severe diarrhea for about 12 hours- The next day she will feel really wiped out Generally no vomiting  No apparent trigger that she can recall Zofran does not help that much  Most recent colonoscopy was in 2019.  I am not able to open her report at this time, but Debbie Hayes believes it was normal  She is not aware of any family history of Crohn's disease or ulcerative colitis    Patient Active Problem List   Diagnosis Date Noted   Trigger thumb, right thumb 11/19/2020   Right wrist pain 10/23/2020   Osteoporosis 04/05/2020   Trigger finger, left middle finger 07/27/2019   Chronic neck pain 11/10/2018   Orthopedic hardware present 02/17/2018   Diabetes mellitus due to underlying condition with complication ([place]) 29/52/8413   Intractable chronic migraine without aura and with status migrainosus 12/15/2017   Arthralgia 12/15/2017   Joint pain 12/15/2017   Migraine, transformed 12/15/2017   Refractory migraine without aura 12/15/2017   Pain of left shoulder joint on movement 10/19/2017   OSA (obstructive sleep apnea) 06/26/2017   Snoring 03/25/2016   Palpitations 03/25/2016   Headache 03/25/2016   Sensory disorder of trigeminal nerve 11/26/2015   Spondylolisthesis of lumbar  region 08/09/2014   Lumbar adjacent segment disease with spondylolisthesis 08/09/2014   Back pain 02/17/2014   Macular degeneration 02/17/2014   Backache 02/17/2014   Other and unspecified hyperlipidemia 10/14/2013   Hyperlipidemia 10/14/2013   Onychomycosis 12/01/2012   Arthritis 04/12/2012   Hypothyroidism 04/12/2012    Past Medical History:  Diagnosis Date   Allergy    Cancer ([place])    thyroid cancer   Cataract    Chronic migraine BOTOX INJECTION EVERY 3 MONTHS   Chronic pain in right shoulder    Constipation    Diabetes mellitus    Disorder of inner ear CAUSES VERTIGO OCCASIONALLY   Fibromyalgia    GERD  (gastroesophageal reflux disease)    Hemorrhoid    History of bladder infections    History of thyroid cancer 2009  S/P TOTAL THYROIDECTOMY AND RADIATION   Hyperlipidemia    Hypertension CARDIOLOGIST- DR Wynonia Lawman- WILL REQUEST LATE NOTE   DENIES S & S   Hypothyroidism    Insomnia    Left shoulder pain    Macular degeneration    MRSA infection    nasal treated more than 15 years ago   Normal nuclear stress test 01-25-2009   OA (osteoarthritis) JOINTS   Osteopenia    Painful orthopaedic hardware ([place])    left foot   PONV (postoperative nausea and vomiting) SEVERE   Rotator cuff disorder LEFT SHOULDER RTC IMPINGMENT   Sleep apnea    mouth guard, no cpap    Sleep apnea in adult 06/26/2017   Spondylolisthesis of lumbar region    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   Wears glasses     Past Surgical History:  Procedure Laterality Date   BACK SURGERY     Dr Arnoldo Morale- spondylosis   BILATERAL CARPAL TUNNEL RELEASE  1994   BILATERAL ELBOW SURG.  [place] 12 DAYS AFTER    CATARACT EXTRACTION, BILATERAL     COLONOSCOPY     EYE SURGERY     FOOT ARTHRODESIS Left 07/10/2016   Procedure: LEFT 2ND TARSAL METATARSAL ARTHRODESIS  GASTROC RECESSION LEFT LAPIDUS MODIFIED MCBRIDE BUIONECTOMY;  Surgeon: Wylene Simmer, MD;  Location: [facility];  Service: Orthopedics;  Laterality: Left;   GASTROC RECESSION EXTREMITY Left 07/10/2016   Procedure: GASTROC RECESSION EXTREMITY;  Surgeon: Wylene Simmer, MD;  Location: [facility];  Service: Orthopedics;  Laterality: Left;   HARDWARE REMOVAL Left 05/20/2018   Procedure: Left foot removal of deep implants;  Surgeon: Wylene Simmer, MD;  Location: [facility];  Service: Orthopedics;  Laterality: Left;  56mn   LEFT THUMB JOINT REPLACEMENT  2005   RIGHT DONE IN 2004   OOPHORECTOMY     POLYPECTOMY     RIGHT KNEE ARTHROSCOPY  X2  BEFORE 2011   RIGHT KNEE  ARTHROSCOPY/ PARTIAL LATERAL MENISECTOMY/ TRICOMPARTMENT CHONDROPLASTY/ DECOMPRESSION CYST  10-26-2009   RIGHT KNEE CLOSED MANIPULATION  09-11-2010   RIGHT SHOULDER ARTHROSCOPY  2010  &  2004   TIBIA CYST REMOVED AND ORIF LEG [place]  TOTAL KNEE ARTHROPLASTY  07-16-2010   RIGHT   TOTAL THYROIDECTOMY  2009   CANCER  (POST-OP BLEED)  AND RADIATION [place]   trigger fingers Right 2013   3rd and 4th fingers   UPPER GASTROINTESTINAL ENDOSCOPY     VAGINAL HYSTERECTOMY  1990    Social History   Tobacco Use   Smoking  status: Never   Smokeless tobacco: Never  Vaping Use   Vaping Use: Never used  Substance Use Topics   Alcohol use: Yes    Comment: RARE   Drug use: No    Family History  Problem Relation Age of Onset   Colon polyps Sister 61   Alzheimer's disease Mother    Heart disease Father    Cirrhosis Father    Heart attack Father    Macular degeneration Father    Colon cancer Neg Hx     Allergies  Allergen Reactions   Statins Other (See Comments)    Pt has tried multiple and cannot tolerate   Sulfa Antibiotics Hives    Medication list has been reviewed and updated.  Current Outpatient Medications on File Prior to Visit  Medication Sig Dispense Refill   azelastine (ASTELIN) 0.1 % nasal spray Place 2 sprays into both nostrils 2 (two) times daily. Use in each nostril as directed 30 mL 12   blood glucose meter kit and supplies KIT Test blood sugar once daily. Dx code: E11.9 1 each 0   Blood Glucose Monitoring Suppl (FREESTYLE LITE) DEVI Check blood sugar no more than twice daily 1 each 0   Evolocumab (REPATHA) 140 MG/ML SOSY Inject 140 mg into the skin every 14 (fourteen) days. 6 mL 3   Exenatide ER 2 MG/0.85ML AUIJ INJECT 2 MG INTO THE SKIN ONCE A WEEK. 7.65 mL 12   glucose blood (FREESTYLE LITE) test strip CHECK BLOOD SUGARS NO MORE THAN TWICE DAILY 100 each 12   Lancets (FREESTYLE) lancets Check blood sugars no more than twice daily 100 each 12    levothyroxine (SYNTHROID) 125 MCG tablet TAKE 1 TABLET BY MOUTH DAILY BEFORE BREAKFAST. **NEED APPOINTMENT FOR FURTHER REFILLS** 90 tablet 3   linagliptin (TRADJENTA) 5 MG TABS tablet Take 1 tablet (5 mg total) by mouth daily. 30 tablet 6   losartan (COZAAR) 50 MG tablet TAKE 1 TABLET BY MOUTH DAILY. 90 tablet 1   magnesium 30 MG tablet Take 30 mg by mouth 2 (two) times daily.     metaxalone (SKELAXIN) 800 MG tablet TAKE 1 TABLET BY MOUTH 3 TIMES DAILY 90 tablet 3   metoprolol succinate (TOPROL-XL) 50 MG 24 hr tablet TAKE 1 TABLET BY MOUTH DAILY. TAKE WITH OR IMMEDIATELY FOLLOWING A MEAL 90 tablet 3   montelukast (SINGULAIR) 10 MG tablet TAKE 1 TABLET BY MOUTH AT BEDTIME 90 tablet 3   nitrofurantoin (MACRODANTIN) 50 MG capsule TAKE 1 CAPSULE BY MOUTH AT BEDTIME 90 capsule 3   topiramate (TOPAMAX) 100 MG tablet Take 1 tablet (100 mg total) by mouth at bedtime. 30 tablet 3   traZODone (DESYREL) 100 MG tablet TAKE 2 AND 1/2 TABLETS BY MOUTH AT BEDTIME 225 tablet 3   Ubrogepant 50 MG TABS TAKE 1 TABLET BY MOUTH AS NEEDED (MAX 200 MG DAILY) 10 tablet 5   zolpidem (AMBIEN) 5 MG tablet Take 1 tablet (5 mg total) by mouth at bedtime as needed for sleep. 90 tablet 1   oxyCODONE HCl 7.5 MG TABS oxycodone     No current facility-administered medications on file prior to visit.    Review of Systems:  As per HPI- otherwise negative.   Physical Examination: Vitals:   02/11/21 1636  BP: 122/80  Pulse: 85  Temp: 97.9 F (36.6 C)  SpO2: 99%   Vitals:   02/11/21 1636  Weight: 180 lb 3.2 oz (81.7 kg)  Height: 5' 8" (1.727 m)   Body  mass index is 27.4 kg/m. Ideal Body Weight: Weight in (lb) to have BMI = 25: 164.1  GEN: no acute distress.  Mild overweight, looks well HEENT: Atraumatic, Normocephalic.  Ears and Nose: No external deformity. CV: RRR, No M/G/R. No JVD. No thrill. No extra heart sounds. PULM: CTA B, no wheezes, crackles, rhonchi. No retractions. No resp. distress. No accessory  muscle use. ABD: S, NT, ND, +BS. No rebound. No HSM. EXTR: No c/c/e PSYCH: Normally interactive. Conversant.  She has 2 seborrheic keratoses, 1 in each leg which are bothersome.  Verbal consent obtained, these are treated with liquid nitrogen x3 cycles of cryotherapy today   Assessment and plan  Diabetes mellitus type 2 with complications ([place]) - Plan: Hemoglobin A1c, empagliflozin (JARDIANCE) 10 MG TABS tablet  Mixed hyperlipidemia  Insomnia, unspecified type  OSA (obstructive sleep apnea)  Acquired hypothyroidism - Plan: TSH  Dysuria - Plan: Urine Culture, POCT urinalysis dipstick  Diarrhea, unspecified type - Plan: diphenoxylate-atropine (LOMOTIL) 2.5-0.025 MG tablet  Acute cystitis without hematuria - Plan: amoxicillin (AMOXIL) 500 MG capsule   Here today for a follow-up visit.  Unfortunately has had to change a few of lyses medications due to insurance coverage.  This has caused an increase in her A1c.  I prescribed Jardiance, she will let me know if not covered  Urine culture pending  For diarrhea, we will have her try Lomotil during her diarrheal prodrome.  She will let me know if this is not helpful.  If symptoms continue we may want her to follow-up with GI  Discussed her feelings of sadness.  Debbie Hayes does not wish to do any other treatment for depression right now, but she is looking forward to Debbie Hayes coming home this weekend  ? Taking tradjenta along with Bydurion may not be adding much.  Will address with pt pending her labs  This visit occurred during the SARS-CoV-2 public health emergency.  Safety protocols were in place, including screening questions prior to the visit, additional usage of staff PPE, and extensive cleaning of exam room while observing appropriate contact time as indicated for disinfecting solutions.   Signed Lamar Blinks, MD  Addendum 7/19, received her labs as below-message to patient  Results for orders placed or performed in visit on 02/11/21   Urine Culture   Specimen: Urine  Result Value Ref Range   MICRO NUMBER: [id number]    SPECIMEN QUALITY: Adequate    Sample Source NOT GIVEN    STATUS: FINAL    ISOLATE 1: Enterococcus faecium (A)       Susceptibility   Enterococcus faecium - URINE CULTURE POSITIVE 1    AMPICILLIN <=2 Sensitive     VANCOMYCIN <=0.5 Sensitive     NITROFURANTOIN* 128 Resistant      * Legend: S = Susceptible  I = Intermediate R = Resistant  NS = Not susceptible * = Not tested  NR = Not reported **NN = See antimicrobic comments   Hemoglobin A1c  Result Value Ref Range   Hgb A1c MFr Bld 7.1 (H) 4.6 - 6.5 %  TSH  Result Value Ref Range   TSH 2.65 0.35 - 5.50 uIU/mL  POCT urinalysis dipstick  Result Value Ref Range   Color, UA yellow yellow   Clarity, UA clear clear   Glucose, UA negative negative mg/dL   Bilirubin, UA negative negative   Ketones, POC UA negative negative mg/dL   Spec Grav, UA 1.015 1.010 - 1.025   Blood, UA negative negative  pH, UA 6.0 5.0 - 8.0   Protein Ur, POC negative negative mg/dL   Urobilinogen, UA 0.2 0.2 or 1.0 E.U./dL   Nitrite, UA Negative Negative   Leukocytes, UA Negative Negative   Received her urine culture, 7/20-message to patient We will treat with amoxicillin

## 2021-02-11 ENCOUNTER — Encounter: Payer: Self-pay | Admitting: Family Medicine

## 2021-02-11 ENCOUNTER — Ambulatory Visit (INDEPENDENT_AMBULATORY_CARE_PROVIDER_SITE_OTHER): Payer: Medicare Other | Admitting: Family Medicine

## 2021-02-11 ENCOUNTER — Other Ambulatory Visit: Payer: Self-pay

## 2021-02-11 VITALS — BP 122/80 | HR 85 | Temp 97.9°F | Ht 68.0 in | Wt 180.2 lb

## 2021-02-11 DIAGNOSIS — E118 Type 2 diabetes mellitus with unspecified complications: Secondary | ICD-10-CM | POA: Diagnosis not present

## 2021-02-11 DIAGNOSIS — E039 Hypothyroidism, unspecified: Secondary | ICD-10-CM

## 2021-02-11 DIAGNOSIS — G4700 Insomnia, unspecified: Secondary | ICD-10-CM | POA: Diagnosis not present

## 2021-02-11 DIAGNOSIS — N3000 Acute cystitis without hematuria: Secondary | ICD-10-CM

## 2021-02-11 DIAGNOSIS — E782 Mixed hyperlipidemia: Secondary | ICD-10-CM | POA: Diagnosis not present

## 2021-02-11 DIAGNOSIS — R197 Diarrhea, unspecified: Secondary | ICD-10-CM

## 2021-02-11 DIAGNOSIS — E088 Diabetes mellitus due to underlying condition with unspecified complications: Secondary | ICD-10-CM

## 2021-02-11 DIAGNOSIS — G4733 Obstructive sleep apnea (adult) (pediatric): Secondary | ICD-10-CM

## 2021-02-11 DIAGNOSIS — R300 Dysuria: Secondary | ICD-10-CM

## 2021-02-11 LAB — POCT URINALYSIS DIP (MANUAL ENTRY)
Bilirubin, UA: NEGATIVE
Blood, UA: NEGATIVE
Glucose, UA: NEGATIVE mg/dL
Ketones, POC UA: NEGATIVE mg/dL
Leukocytes, UA: NEGATIVE
Nitrite, UA: NEGATIVE
Protein Ur, POC: NEGATIVE mg/dL
Spec Grav, UA: 1.015 (ref 1.010–1.025)
Urobilinogen, UA: 0.2 E.U./dL
pH, UA: 6 (ref 5.0–8.0)

## 2021-02-11 MED ORDER — DIPHENOXYLATE-ATROPINE 2.5-0.025 MG PO TABS: 1.0000 | ORAL_TABLET | Freq: Four times a day (QID) | ORAL | 0 refills | Status: DC | PRN

## 2021-02-11 MED ORDER — EMPAGLIFLOZIN 10 MG PO TABS: 10.0000 mg | ORAL_TABLET | Freq: Every day | ORAL | 3 refills | Status: DC

## 2021-02-11 NOTE — Patient Instructions (Addendum)
It was good to see you today!  Hang in there I will be in touch with your labs and your urine asap Let me know about the Jardiance rx (same class as [place]) - if too $$$ we can do an appeal

## 2021-02-12 ENCOUNTER — Encounter: Payer: Self-pay | Admitting: Family Medicine

## 2021-02-12 LAB — TSH: TSH: 2.65 u[IU]/mL (ref 0.35–5.50)

## 2021-02-12 LAB — HEMOGLOBIN A1C: Hgb A1c MFr Bld: 7.1 % — ABNORMAL HIGH (ref 4.6–6.5)

## 2021-02-13 ENCOUNTER — Encounter: Payer: Self-pay | Admitting: Family Medicine

## 2021-02-13 LAB — URINE CULTURE
MICRO NUMBER:: 12130940
SPECIMEN QUALITY:: ADEQUATE

## 2021-02-13 MED ORDER — AMOXICILLIN 500 MG PO CAPS
ORAL_CAPSULE | ORAL | 0 refills | Status: DC
Start: 2021-02-13 — End: 2021-05-14

## 2021-02-13 NOTE — Addendum Note (Signed)
Addended by: Lamar Blinks C on: 02/13/2021 09:16 PM   Modules accepted: Orders

## 2021-02-27 ENCOUNTER — Encounter: Payer: Self-pay | Admitting: Family Medicine

## 2021-02-27 MED ORDER — NARATRIPTAN HCL 2.5 MG PO TABS: 2.5000 mg | ORAL_TABLET | ORAL | 4 refills | Status: DC | PRN

## 2021-03-06 ENCOUNTER — Other Ambulatory Visit (HOSPITAL_COMMUNITY): Payer: Self-pay

## 2021-03-08 ENCOUNTER — Encounter: Payer: Self-pay | Admitting: Family Medicine

## 2021-03-15 ENCOUNTER — Encounter: Payer: Self-pay | Admitting: Family Medicine

## 2021-03-15 DIAGNOSIS — G43709 Chronic migraine without aura, not intractable, without status migrainosus: Secondary | ICD-10-CM

## 2021-03-25 ENCOUNTER — Ambulatory Visit (INDEPENDENT_AMBULATORY_CARE_PROVIDER_SITE_OTHER): Payer: Medicare Other | Admitting: Sports Medicine

## 2021-03-25 ENCOUNTER — Other Ambulatory Visit: Payer: Self-pay | Admitting: Family Medicine

## 2021-03-25 ENCOUNTER — Encounter: Payer: Self-pay | Admitting: Family Medicine

## 2021-03-25 ENCOUNTER — Ambulatory Visit (INDEPENDENT_AMBULATORY_CARE_PROVIDER_SITE_OTHER): Payer: Medicare Other

## 2021-03-25 ENCOUNTER — Other Ambulatory Visit: Payer: Self-pay

## 2021-03-25 DIAGNOSIS — M7701 Medial epicondylitis, right elbow: Secondary | ICD-10-CM | POA: Insufficient documentation

## 2021-03-25 MED ORDER — LEVOTHYROXINE SODIUM 125 MCG PO TABS: 125.0000 ug | ORAL_TABLET | Freq: Every day | ORAL | 3 refills | Status: DC

## 2021-03-25 NOTE — Assessment & Plan Note (Signed)
This is a pleasant 70 year old female, she has a history of lateral epicondylitis approximately 23 years ago, needed operative intervention. More recently for the past several weeks she has had increasing pain medial right elbow, on exam she has clinical medial epicondylitis. She is desiring injection after failure of analgesics and bracing. Today we did a medial epicondylitis injection, return to see me in 1 month, conditioning exercises given.

## 2021-03-25 NOTE — Progress Notes (Signed)
    Procedures performed today:    Procedure: Real-time Ultrasound Guided injection of the right common flexor tendon or Device: Samsung HS60  Verbal informed consent obtained.  Time-out conducted.  Noted no overlying erythema, induration, or other signs of local infection.  Skin prepped in a sterile fashion.  Local anesthesia: Topical Ethyl chloride.  With sterile technique and under real time ultrasound guidance: Noted thickened tendon, 1 cc Kenalog 40, 1 cc lidocaine, 1 cc bupivacaine injected easily Completed without difficulty  Advised to call if fevers/chills, erythema, induration, drainage, or persistent bleeding.  Images permanently stored and available for review in PACS.  Impression: Technically successful ultrasound guided injection.  Independent interpretation of notes and tests performed by another provider:   None.  Brief History, Exam, Impression, and Recommendations:    Medial epicondylitis, right This is a pleasant 69 year old female, she has a history of lateral epicondylitis approximately 23 years ago, needed operative intervention. More recently for the past several weeks she has had increasing pain medial right elbow, on exam she has clinical medial epicondylitis. She is desiring injection after failure of analgesics and bracing. Today we did a medial epicondylitis injection, return to see me in 1 month, conditioning exercises given.    ___________________________________________ Gwen Her. Dianah Field, M.D., ABFM., CAQSM. Primary Care and [place] Instructor of [place] [facility]

## 2021-04-01 MED ORDER — TOPIRAMATE ER 100 MG PO CAP24: 100.0000 mg | ORAL_CAPSULE | Freq: Every day | ORAL | 3 refills | Status: DC

## 2021-04-01 NOTE — Addendum Note (Signed)
Addended by: Lamar Blinks C on: 04/01/2021 04:37 PM   Modules accepted: Orders

## 2021-04-03 MED ORDER — OXYCODONE HCL 5 MG PO TABS: ORAL_TABLET | ORAL | 0 refills | Status: DC

## 2021-04-03 NOTE — Addendum Note (Signed)
Addended by: Lamar Blinks C on: 04/03/2021 10:48 AM   Modules accepted: Orders

## 2021-04-04 ENCOUNTER — Telehealth: Payer: Self-pay | Admitting: *Deleted

## 2021-04-04 NOTE — Telephone Encounter (Signed)
After further review pt is now on [place]

## 2021-04-04 NOTE — Telephone Encounter (Signed)
Prior auth started via cover my meds.  Awaiting determination.  Key: [id number]

## 2021-04-05 ENCOUNTER — Encounter: Payer: Self-pay | Admitting: Family Medicine

## 2021-04-05 DIAGNOSIS — N390 Urinary tract infection, site not specified: Secondary | ICD-10-CM

## 2021-04-05 DIAGNOSIS — B379 Candidiasis, unspecified: Secondary | ICD-10-CM

## 2021-04-05 DIAGNOSIS — G43709 Chronic migraine without aura, not intractable, without status migrainosus: Secondary | ICD-10-CM

## 2021-04-08 MED ORDER — UBRELVY 100 MG PO TABS: 100.0000 mg | ORAL_TABLET | Freq: Every day | ORAL | 5 refills | Status: DC | PRN

## 2021-04-08 MED ORDER — FLUCONAZOLE 150 MG PO TABS: 150.0000 mg | ORAL_TABLET | Freq: Once | ORAL | 0 refills | Status: DC

## 2021-04-08 MED ORDER — ONDANSETRON HCL 4 MG PO TABS: 4.0000 mg | ORAL_TABLET | Freq: Three times a day (TID) | ORAL | 1 refills | Status: DC | PRN

## 2021-04-08 MED ORDER — PHENAZOPYRIDINE HCL 200 MG PO TABS
200.0000 mg | ORAL_TABLET | Freq: Three times a day (TID) | ORAL | 1 refills | Status: DC | PRN
Start: 2021-04-08 — End: 2022-11-06

## 2021-04-08 NOTE — Addendum Note (Signed)
Addended by: Lamar Blinks C on: 04/08/2021 10:17 AM   Modules accepted: Orders

## 2021-04-11 ENCOUNTER — Telehealth: Payer: Self-pay | Admitting: Family Medicine

## 2021-04-11 ENCOUNTER — Telehealth: Payer: Self-pay

## 2021-04-11 ENCOUNTER — Encounter: Payer: Self-pay | Admitting: Family Medicine

## 2021-04-11 NOTE — Telephone Encounter (Signed)
error 

## 2021-04-11 NOTE — Telephone Encounter (Signed)
Prior authorization initiated via cover my meds.   KEY:  [id number]  Waiting on determination.

## 2021-04-11 NOTE — Telephone Encounter (Signed)
Prior authorization approved for rephata

## 2021-04-22 ENCOUNTER — Ambulatory Visit: Payer: Medicare Other | Admitting: Sports Medicine

## 2021-04-23 ENCOUNTER — Telehealth: Payer: Self-pay

## 2021-04-23 NOTE — Telephone Encounter (Signed)
PA started, awaiting determination.   Debbie Hayes Key: [id number] - Rx #: [id number]

## 2021-04-30 ENCOUNTER — Encounter: Payer: Self-pay | Admitting: Family Medicine

## 2021-05-07 ENCOUNTER — Encounter: Payer: Self-pay | Admitting: Family Medicine

## 2021-05-07 MED ORDER — BENZONATATE 100 MG PO CAPS: 100.0000 mg | ORAL_CAPSULE | Freq: Three times a day (TID) | ORAL | 2 refills | Status: DC | PRN

## 2021-05-08 ENCOUNTER — Encounter: Payer: Self-pay | Admitting: Podiatry

## 2021-05-08 ENCOUNTER — Other Ambulatory Visit: Payer: Self-pay

## 2021-05-08 ENCOUNTER — Ambulatory Visit: Payer: Medicare Other | Admitting: Podiatry

## 2021-05-08 DIAGNOSIS — L600 Ingrowing nail: Secondary | ICD-10-CM | POA: Diagnosis not present

## 2021-05-08 NOTE — Progress Notes (Signed)
Subjective:   Patient ID: Debbie Hayes, female   DOB: 70 y.o.   MRN: [id number]   HPI Patient presents with an incurvated right second nail medial border painful when pressed that is making shoe gear difficult   ROS      Objective:  Physical Exam  Neurovascular status intact incurvated medial border right second toe no active drainage or redness noted     Assessment:  Ingrown toenail deformity right second nail     Plan:  H&P reviewed condition recommended correction explained procedure risk patient wants surgery today I infiltrated the right second toe 60 mg like Marcaine mixture sterile prep done using sterile instrumentation remove the medial border exposed matrix applied phenol 3 applications 30 seconds followed by alcohol lavage sterile dressing gave instructions on soaks and reappoint to recheck

## 2021-05-08 NOTE — Patient Instructions (Signed)

## 2021-05-14 ENCOUNTER — Ambulatory Visit (INDEPENDENT_AMBULATORY_CARE_PROVIDER_SITE_OTHER): Payer: Medicare Other | Admitting: Family

## 2021-05-14 ENCOUNTER — Other Ambulatory Visit: Payer: Self-pay

## 2021-05-14 ENCOUNTER — Ambulatory Visit: Payer: 59 | Admitting: Family Medicine

## 2021-05-14 VITALS — BP 102/59 | HR 75 | Temp 98.1°F | Resp 16 | Wt 180.0 lb

## 2021-05-14 DIAGNOSIS — N3000 Acute cystitis without hematuria: Secondary | ICD-10-CM | POA: Diagnosis not present

## 2021-05-14 LAB — POC URINALSYSI DIPSTICK (AUTOMATED)
Bilirubin, UA: NEGATIVE
Blood, UA: NEGATIVE
Glucose, UA: POSITIVE — AB
Ketones, UA: NEGATIVE
Nitrite, UA: NEGATIVE
Protein, UA: POSITIVE — AB
Spec Grav, UA: 1.02 (ref 1.010–1.025)
Urobilinogen, UA: 0.2 E.U./dL
pH, UA: 5 (ref 5.0–8.0)

## 2021-05-14 MED ORDER — CEPHALEXIN 500 MG PO CAPS: 500.0000 mg | ORAL_CAPSULE | Freq: Three times a day (TID) | ORAL | 0 refills | Status: DC

## 2021-05-14 NOTE — Assessment & Plan Note (Signed)
+   leuks on UA. Will send for culture and begin empiric keflex 500mg  po tid x 5 days.

## 2021-05-14 NOTE — Progress Notes (Signed)
Subjective:   By signing my name below, I, [name], attest that this documentation has been prepared under the direction and in the presence of Debbrah Alar, NP, 05/14/2021   Patient ID: Debbie Hayes, female    DOB: 1951/04/08, 70 y.o.   MRN: [id number]  Chief Complaint  Patient presents with   Urinary Urgency    Complains of urgency and burning with urination    HPI Patient is in today for an office visit.  Urinary complications: She began having urinary tract infection symptoms late last week. She notes that she has mild frequency, and discomfort with urinating and has seen her urine looking cloudy. She denies tenderness, fever, or low back pain.  She mentions she has had urinary tract infections many times in the past and they come so often that some episodes she just allows them to come and go without seeing a provider. She is here today because she has not been able to pass this infection on her own.    Health Maintenance Due  Topic Date Due   Zoster Vaccines- Shingrix (1 of 2) Never done   COVID-19 Vaccine (3 - Booster for [employer] series) 02/28/2020   FOOT EXAM  10/18/2020   OPHTHALMOLOGY EXAM  12/22/2020   INFLUENZA VACCINE  02/25/2021    Past Medical History:  Diagnosis Date   Allergy    Cancer ([place])    thyroid cancer   Cataract    Chronic migraine BOTOX INJECTION EVERY 3 MONTHS   Chronic pain in right shoulder    Constipation    Diabetes mellitus    Disorder of inner ear CAUSES VERTIGO OCCASIONALLY   Fibromyalgia    GERD (gastroesophageal reflux disease)    Hemorrhoid    History of bladder infections    History of thyroid cancer 2009  S/P TOTAL THYROIDECTOMY AND RADIATION   Hyperlipidemia    Hypertension CARDIOLOGIST- DR Wynonia Lawman- WILL REQUEST LATE NOTE   DENIES S & S   Hypothyroidism    Insomnia    Left shoulder pain    Macular degeneration    MRSA infection    nasal treated more than 15 years ago   Normal nuclear stress test  01-25-2009   OA (osteoarthritis) JOINTS   Osteopenia    Painful orthopaedic hardware ([place])    left foot   PONV (postoperative nausea and vomiting) SEVERE   Rotator cuff disorder LEFT SHOULDER RTC IMPINGMENT   Sleep apnea    mouth guard, no cpap    Sleep apnea in adult 06/26/2017   Spondylolisthesis of lumbar region    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   Wears glasses     Past Surgical History:  Procedure Laterality Date   BACK SURGERY     Dr Arnoldo Morale- spondylosis   BILATERAL CARPAL TUNNEL RELEASE  1994   BILATERAL ELBOW SURG.  [place] 12 DAYS AFTER    CATARACT EXTRACTION, BILATERAL     COLONOSCOPY     EYE SURGERY     FOOT ARTHRODESIS Left 07/10/2016   Procedure: LEFT 2ND TARSAL METATARSAL ARTHRODESIS  GASTROC RECESSION LEFT LAPIDUS MODIFIED MCBRIDE BUIONECTOMY;  Surgeon: Wylene Simmer, MD;  Location: [facility];  Service: Orthopedics;  Laterality: Left;   GASTROC RECESSION EXTREMITY Left 07/10/2016   Procedure: GASTROC RECESSION EXTREMITY;  Surgeon: Wylene Simmer, MD;  Location: [facility];  Service: Orthopedics;  Laterality: Left;   HARDWARE REMOVAL Left 05/20/2018  Procedure: Left foot removal of deep implants;  Surgeon: Wylene Simmer, MD;  Location: [facility];  Service: Orthopedics;  Laterality: Left;  38mn   LEFT THUMB JOINT REPLACEMENT  2005   RIGHT DONE IN 2004   OOPHORECTOMY     POLYPECTOMY     RIGHT KNEE ARTHROSCOPY  X2  BEFORE 2011   RIGHT KNEE ARTHROSCOPY/ PARTIAL LATERAL MENISECTOMY/ TRICOMPARTMENT CHONDROPLASTY/ DECOMPRESSION CYST  10-26-2009   RIGHT KNEE CLOSED MANIPULATION  09-11-2010   RIGHT SHOULDER ARTHROSCOPY  2010  &  2004   TIBIA CYST REMOVED AND ORIF LEG [place]  TOTAL KNEE ARTHROPLASTY  07-16-2010   RIGHT   TOTAL THYROIDECTOMY  2009   CANCER  (POST-OP BLEED)  AND RADIATION [place]   trigger fingers Right 2013   3rd and 4th  fingers   UPPER GASTROINTESTINAL ENDOSCOPY     VAGINAL HYSTERECTOMY  1990    Family History  Problem Relation Age of Onset   Colon polyps Sister 573  Alzheimer's disease Mother    Heart disease Father    Cirrhosis Father    Heart attack Father    Macular degeneration Father    Colon cancer Neg Hx     Social History   Socioeconomic History   Marital status: Married    Spouse name: Not on file   Number of children: Not on file   Years of education: Not on file   Highest education level: Not on file  Occupational History    Comment: retired RTherapist, sports Tobacco Use   Smoking status: Never   Smokeless tobacco: Never  Vaping Use   Vaping Use: Never used  Substance and Sexual Activity   Alcohol use: Yes    Comment: RARE   Drug use: No   Sexual activity: Not on file  Other Topics Concern   Not on file  Social History Narrative   Lives with husband Dr DRuben Reason   They have 2 adult children that live out of state.   Social Determinants of Health   Financial Resource Strain: Not on file  Food Insecurity: Not on file  Transportation Needs: Not on file  Physical Activity: Not on file  Stress: Not on file  Social Connections: Not on file  Intimate Partner Violence: Not on file    Outpatient Medications Prior to Visit  Medication Sig Dispense Refill   benzonatate (TESSALON) 100 MG capsule Take 1 capsule (100 mg total) by mouth 3 (three) times daily as needed for cough. 60 capsule 2   blood glucose meter kit and supplies KIT Test blood sugar once daily. Dx code: E11.9 1 each 0   Blood Glucose Monitoring Suppl (FREESTYLE LITE) DEVI Check blood sugar no more than twice daily 1 each 0   Evolocumab (REPATHA) 140 MG/ML SOSY Inject 140 mg into the skin every 14 (fourteen) days. 6 mL 3   Exenatide ER 2 MG/0.85ML AUIJ INJECT 2 MG INTO THE SKIN ONCE A WEEK. 7.65 mL 12   glucose blood (FREESTYLE LITE) test strip CHECK BLOOD SUGARS NO MORE THAN TWICE DAILY 100 each 12   Lancets  (FREESTYLE) lancets Check blood sugars no more than twice daily 100 each 12   levothyroxine (SYNTHROID) 125 MCG tablet Take 1 tablet (125 mcg total) by mouth daily before breakfast. 90 tablet 3   linagliptin (TRADJENTA) 5 MG TABS tablet Take 1 tablet (5 mg total) by mouth daily. 30 tablet 6   losartan (COZAAR)  50 MG tablet TAKE 1 TABLET BY MOUTH DAILY. 90 tablet 1   magnesium 30 MG tablet Take 30 mg by mouth 2 (two) times daily.     metaxalone (SKELAXIN) 800 MG tablet TAKE 1 TABLET BY MOUTH 3 TIMES DAILY 90 tablet 3   metoprolol succinate (TOPROL-XL) 50 MG 24 hr tablet TAKE 1 TABLET BY MOUTH DAILY. TAKE WITH OR IMMEDIATELY FOLLOWING A MEAL 90 tablet 3   montelukast (SINGULAIR) 10 MG tablet TAKE 1 TABLET BY MOUTH AT BEDTIME 90 tablet 3   nitrofurantoin (MACRODANTIN) 50 MG capsule TAKE 1 CAPSULE BY MOUTH AT BEDTIME 90 capsule 3   ondansetron (ZOFRAN) 4 MG tablet Take 1 tablet (4 mg total) by mouth every 8 (eight) hours as needed for nausea or vomiting. 30 tablet 1   oxyCODONE (OXY IR/ROXICODONE) 5 MG immediate release tablet TAKE 1-1.5 TABLETS (5-7.5 MG TOTAL) BY MOUTH EVERY 6 (SIX) HOURS AS NEEDED FOR SEVERE PAIN. 60 tablet 0   phenazopyridine (PYRIDIUM) 200 MG tablet Take 1 tablet (200 mg total) by mouth 3 (three) times daily as needed for pain. For UTI 30 tablet 1   topiramate (TOPAMAX) 100 MG tablet Take 1 tablet (100 mg total) by mouth at bedtime. 30 tablet 3   Topiramate ER (TROKENDI XR) 100 MG CP24 Take 100 mg by mouth daily. 90 capsule 3   traZODone (DESYREL) 100 MG tablet TAKE 2 AND 1/2 TABLETS BY MOUTH AT BEDTIME 225 tablet 3   Ubrogepant (UBRELVY) 100 MG TABS Take 100 mg by mouth daily as needed. 10 tablet 5   zolpidem (AMBIEN) 5 MG tablet Take 1 tablet (5 mg total) by mouth at bedtime as needed for sleep. 90 tablet 1   amoxicillin (AMOXIL) 500 MG capsule Take 500 mg by mouth 3 times daily for 5 days 15 capsule 0   azelastine (ASTELIN) 0.1 % nasal spray Place 2 sprays into both nostrils 2  (two) times daily. Use in each nostril as directed 30 mL 12   No facility-administered medications prior to visit.    Allergies  Allergen Reactions   Statins Other (See Comments)    Pt has tried multiple and cannot tolerate   Sulfa Antibiotics Hives    Review of Systems  Constitutional:  Negative for fever.  Genitourinary:  Positive for dysuria and frequency (mild). Negative for flank pain and hematuria.      Objective:    Physical Exam Constitutional:      General: She is not in acute distress.    Appearance: Normal appearance. She is not ill-appearing.  HENT:     Head: Normocephalic and atraumatic.     Right Ear: External ear normal.     Left Ear: External ear normal.  Eyes:     Extraocular Movements: Extraocular movements intact.     Pupils: Pupils are equal, round, and reactive to light.  Cardiovascular:     Rate and Rhythm: Normal rate and regular rhythm.     Heart sounds: Normal heart sounds. No murmur heard.   No gallop.  Pulmonary:     Effort: Pulmonary effort is normal. No respiratory distress.     Breath sounds: Normal breath sounds. No wheezing or rales.  Abdominal:     Palpations: Abdomen is soft.     Tenderness: There is no abdominal tenderness. There is no right CVA tenderness or left CVA tenderness.  Lymphadenopathy:     Cervical: No cervical adenopathy.  Skin:    General: Skin is warm and dry.  Neurological:  Mental Status: She is alert and oriented to person, place, and time.  Psychiatric:        Behavior: Behavior normal.        Judgment: Judgment normal.    BP (!) 102/59 (BP Location: Right Arm, Patient Position: Sitting, Cuff Size: Small)   Pulse 75   Temp 98.1 F (36.7 C) (Oral)   Resp 16   Wt 180 lb (81.6 kg)   SpO2 96%   BMI 27.37 kg/m  Wt Readings from Last 3 Encounters:  05/14/21 180 lb (81.6 kg)  02/11/21 180 lb 3.2 oz (81.7 kg)  12/28/20 180 lb 3.2 oz (81.7 kg)       Assessment & Plan:   Problem List Items Addressed  This Visit       Unprioritized   Acute cystitis without hematuria - Primary    + leuks on UA. Will send for culture and begin empiric keflex 575m po tid x 5 days.       Relevant Orders   POCT Urinalysis Dipstick (Automated) (Completed)   Urine Culture   Meds ordered this encounter  Medications   cephALEXin (KEFLEX) 500 MG capsule    Sig: Take 1 capsule (500 mg total) by mouth 3 (three) times daily.    Dispense:  15 capsule    Refill:  0    Order Specific Question:   Supervising Provider    Answer:   BPenni HomansA [[id number]]    I, MDebbrah Alar NP, personally preformed the services described in this documentation.  All medical record entries made by the scribe were at my direction and in my presence.  I have reviewed the chart and discharge instructions (if applicable) and agree that the record reflects my personal performance and is accurate and complete. 05/14/2021  I,[name],acting as a sEducation administratorfor MNance Pear NP.,have documented all relevant documentation on the behalf of MNance Pear NP,as directed by  MNance Pear NP while in the presence of MNance Pear NP.  MNance Pear NP

## 2021-05-14 NOTE — Patient Instructions (Signed)
Please start Keflex. Call if symptoms worsen or if symptoms do not improve.

## 2021-05-16 LAB — URINE CULTURE
MICRO NUMBER:: 12517276
SPECIMEN QUALITY:: ADEQUATE

## 2021-05-18 ENCOUNTER — Other Ambulatory Visit: Payer: Self-pay

## 2021-05-18 ENCOUNTER — Emergency Department
Admission: RE | Admit: 2021-05-18 | Discharge: 2021-05-18 | Disposition: A | Discharge status: Home / Self Care | Payer: Medicare Other | Source: Ambulatory Visit

## 2021-05-18 ENCOUNTER — Emergency Department (INDEPENDENT_AMBULATORY_CARE_PROVIDER_SITE_OTHER): Payer: Medicare Other

## 2021-05-18 VITALS — BP 108/72 | HR 84 | Temp 98.6°F | Resp 16 | Ht 68.0 in | Wt 175.0 lb

## 2021-05-18 DIAGNOSIS — R059 Cough, unspecified: Secondary | ICD-10-CM | POA: Diagnosis not present

## 2021-05-18 DIAGNOSIS — R053 Chronic cough: Secondary | ICD-10-CM | POA: Diagnosis not present

## 2021-05-18 MED ORDER — BENZONATATE 200 MG PO CAPS: 200.0000 mg | ORAL_CAPSULE | Freq: Three times a day (TID) | ORAL | 0 refills | Status: DC | PRN

## 2021-05-18 MED ORDER — METHYLPREDNISOLONE 4 MG PO TBPK: ORAL_TABLET | ORAL | 0 refills | Status: DC

## 2021-05-18 NOTE — ED Triage Notes (Addendum)
Cough since 05/06/21 Pt's husband is a doctor & hears some crackles in her bases  Taking robitussin, Tessalon perles since 10/10 Started albuterol inhaler 2 days ago [place] test was negative on  05/09/21 COVID vaccine + 3 boosters Recently treated for a UTI & an ingrown toe nail w/ keflex - started on 10/18 x 5 days

## 2021-05-18 NOTE — ED Provider Notes (Signed)
[name]    CSN: [id number] Arrival date & time: 05/18/21  1021      History   Chief Complaint Chief Complaint  Patient presents with   Cough    HPI Debbie Hayes is a 70 y.o. female.   HPI 70 year old female presents with crackles in her bases since 05/06/2021.  Patient reports that her husband is a physician and listen to crackles in her bases.  Past Medical History:  Diagnosis Date   Allergy    Cancer ([place])    thyroid cancer   Cataract    Chronic migraine BOTOX INJECTION EVERY 3 MONTHS   Chronic pain in right shoulder    Constipation    Diabetes mellitus    Disorder of inner ear CAUSES VERTIGO OCCASIONALLY   Fibromyalgia    GERD (gastroesophageal reflux disease)    Hemorrhoid    History of bladder infections    History of thyroid cancer 2009  S/P TOTAL THYROIDECTOMY AND RADIATION   Hyperlipidemia    Hypertension CARDIOLOGIST- DR Wynonia Lawman- WILL REQUEST LATE NOTE   DENIES S & S   Hypothyroidism    Insomnia    Left shoulder pain    Macular degeneration    MRSA infection    nasal treated more than 15 years ago   Normal nuclear stress test 01-25-2009   OA (osteoarthritis) JOINTS   Osteopenia    Painful orthopaedic hardware ([place])    left foot   PONV (postoperative nausea and vomiting) SEVERE   Rotator cuff disorder LEFT SHOULDER RTC IMPINGMENT   Sleep apnea    mouth guard, no cpap    Sleep apnea in adult 06/26/2017   Spondylolisthesis of lumbar region    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   Wears glasses     Patient Active Problem List   Diagnosis Date Noted   Acute cystitis without hematuria 05/14/2021   Medial epicondylitis, right 03/25/2021   Trigger thumb, right thumb 11/19/2020   Right wrist pain 10/23/2020   Osteoporosis 04/05/2020   Trigger finger, left middle finger 07/27/2019   Chronic neck pain 11/10/2018   Orthopedic hardware present 02/17/2018   Diabetes mellitus due to underlying condition with complication ([place])  79/39/0300   Intractable chronic migraine without aura and with status migrainosus 12/15/2017   Arthralgia 12/15/2017   Joint pain 12/15/2017   Migraine, transformed 12/15/2017   Refractory migraine without aura 12/15/2017   Pain of left shoulder joint on movement 10/19/2017   OSA (obstructive sleep apnea) 06/26/2017   Snoring 03/25/2016   Palpitations 03/25/2016   Headache 03/25/2016   Sensory disorder of trigeminal nerve 11/26/2015   Spondylolisthesis of lumbar region 08/09/2014   Lumbar adjacent segment disease with spondylolisthesis 08/09/2014   Back pain 02/17/2014   Macular degeneration 02/17/2014   Backache 02/17/2014   Other and unspecified hyperlipidemia 10/14/2013   Hyperlipidemia 10/14/2013   Onychomycosis 12/01/2012   Arthritis 04/12/2012   Hypothyroidism 04/12/2012    Past Surgical History:  Procedure Laterality Date   BACK SURGERY     Dr Arnoldo Morale- spondylosis   BILATERAL CARPAL TUNNEL RELEASE  1994   BILATERAL ELBOW SURG.  [place] 12 DAYS AFTER    CATARACT EXTRACTION, BILATERAL     COLONOSCOPY     EYE SURGERY     FOOT ARTHRODESIS Left 07/10/2016   Procedure: LEFT 2ND TARSAL METATARSAL ARTHRODESIS  GASTROC RECESSION LEFT LAPIDUS MODIFIED MCBRIDE BUIONECTOMY;  Surgeon: Wylene Simmer, MD;  Location: [facility];  Service: Orthopedics;  Laterality: Left;   GASTROC RECESSION EXTREMITY Left 07/10/2016   Procedure: GASTROC RECESSION EXTREMITY;  Surgeon: Wylene Simmer, MD;  Location: [facility];  Service: Orthopedics;  Laterality: Left;   HARDWARE REMOVAL Left 05/20/2018   Procedure: Left foot removal of deep implants;  Surgeon: Wylene Simmer, MD;  Location: [facility];  Service: Orthopedics;  Laterality: Left;  51mn   LEFT THUMB JOINT REPLACEMENT  2005   RIGHT DONE IN 2004   OOPHORECTOMY     POLYPECTOMY     RIGHT KNEE ARTHROSCOPY  X2  BEFORE 2011   RIGHT KNEE  ARTHROSCOPY/ PARTIAL LATERAL MENISECTOMY/ TRICOMPARTMENT CHONDROPLASTY/ DECOMPRESSION CYST  10-26-2009   RIGHT KNEE CLOSED MANIPULATION  09-11-2010   RIGHT SHOULDER ARTHROSCOPY  2010  &  2004   TIBIA CYST REMOVED AND ORIF LEG [place]  TOTAL KNEE ARTHROPLASTY  07-16-2010   RIGHT   TOTAL THYROIDECTOMY  2009   CANCER  (POST-OP BLEED)  AND RADIATION [place]   trigger fingers Right 2013   3rd and 4th fingers   UPPER GASTROINTESTINAL ENDOSCOPY     VAGINAL HYSTERECTOMY  1990    OB History   No obstetric history on file.      Home Medications    Prior to Admission medications   Medication Sig Start Date End Date Taking? Authorizing Provider  benzonatate (TESSALON) 200 MG capsule Take 1 capsule (200 mg total) by mouth 3 (three) times daily as needed for up to 7 days for cough. 05/18/21 05/25/21 Yes REliezer Lofts FNP  methylPREDNISolone (MEDROL DOSEPAK) 4 MG TBPK tablet Take as directed 05/18/21  Yes REliezer Lofts FNP  blood glucose meter kit and supplies KIT Test blood sugar once daily. Dx code: E11.9 03/08/15   DDarlyne Russian MD  Blood Glucose Monitoring Suppl (FREESTYLE LITE) DEVI Check blood sugar no more than twice daily 09/23/17   Copland, JGay Filler MD  cephALEXin (KEFLEX) 500 MG capsule Take 1 capsule (500 mg total) by mouth 3 (three) times daily. 05/14/21   ODebbrah Alar NP  Evolocumab (REPATHA) 140 MG/ML SOSY Inject 140 mg into the skin every 14 (fourteen) days. 01/22/21   Copland, JGay Filler MD  Exenatide ER 2 MG/0.85ML AUIJ INJECT 2 MG INTO THE SKIN ONCE A WEEK. 05/14/20 05/14/21  Copland, JGay Filler MD  glucose blood (FREESTYLE LITE) test strip CHECK BLOOD SUGARS NO MORE THAN TWICE DAILY 10/26/18   Copland, JGay Filler MD  Lancets (FREESTYLE) lancets Check blood sugars no more than twice daily 09/23/17   Copland, JGay Filler MD  levothyroxine (SYNTHROID) 125 MCG tablet Take 1 tablet (125 mcg total) by mouth daily before breakfast. 03/25/21 03/25/22  Copland,  JGay Filler MD  linagliptin (TRADJENTA) 5 MG TABS tablet Take 1 tablet (5 mg total) by mouth daily. 01/27/21   Copland, JGay Filler MD  losartan (COZAAR) 50 MG tablet TAKE 1 TABLET BY MOUTH DAILY. 09/20/20 09/20/21  Copland, JGay Filler MD  magnesium 30 MG tablet Take 30 mg by mouth 2 (two) times daily.    [provider]  metaxalone (SKELAXIN) 800 MG tablet TAKE 1 TABLET BY MOUTH 3 TIMES DAILY 07/17/20 07/17/21  Copland, JGay Filler MD  metoprolol succinate (TOPROL-XL) 50 MG 24 hr tablet TAKE 1 TABLET BY MOUTH DAILY. TAKE WITH OR IMMEDIATELY FOLLOWING A MEAL 01/17/21 01/17/22  Copland, JGay Filler MD  montelukast (SINGULAIR) 10 MG tablet TAKE 1 TABLET BY  MOUTH AT BEDTIME 10/08/20 10/08/21  Copland, Gay Filler, MD  nitrofurantoin (MACRODANTIN) 50 MG capsule TAKE 1 CAPSULE BY MOUTH AT BEDTIME 09/10/20 09/10/21  Copland, Gay Filler, MD  ondansetron (ZOFRAN) 4 MG tablet Take 1 tablet (4 mg total) by mouth every 8 (eight) hours as needed for nausea or vomiting. 04/08/21   Copland, Gay Filler, MD  oxyCODONE (OXY IR/ROXICODONE) 5 MG immediate release tablet TAKE 1-1.5 TABLETS (5-7.5 MG TOTAL) BY MOUTH EVERY 6 (SIX) HOURS AS NEEDED FOR SEVERE PAIN. 04/03/21 09/30/21  Copland, Gay Filler, MD  phenazopyridine (PYRIDIUM) 200 MG tablet Take 1 tablet (200 mg total) by mouth 3 (three) times daily as needed for pain. For UTI 04/08/21   Copland, Gay Filler, MD  topiramate (TOPAMAX) 100 MG tablet Take 1 tablet (100 mg total) by mouth at bedtime. 01/31/21   Melvenia Beam, MD  Topiramate ER (TROKENDI XR) 100 MG CP24 Take 100 mg by mouth daily. 04/01/21   Copland, Gay Filler, MD  traZODone (DESYREL) 100 MG tablet TAKE 2 AND 1/2 TABLETS BY MOUTH AT BEDTIME 01/17/21 01/17/22  Copland, Gay Filler, MD  Ubrogepant (UBRELVY) 100 MG TABS Take 100 mg by mouth daily as needed. 04/08/21   Copland, Gay Filler, MD  zolpidem (AMBIEN) 5 MG tablet Take 1 tablet (5 mg total) by mouth at bedtime as needed for sleep. 12/05/20 06/03/21  Copland, Gay Filler, MD     Family History Family History  Problem Relation Age of Onset   Colon polyps Sister 70   Alzheimer's disease Mother    Heart disease Father    Cirrhosis Father    Heart attack Father    Macular degeneration Father    Colon cancer Neg Hx     Social History Social History   Tobacco Use   Smoking status: Never   Smokeless tobacco: Never  Vaping Use   Vaping Use: Never used  Substance Use Topics   Alcohol use: Yes    Comment: RARE   Drug use: No     Allergies   Sulfa antibiotics and Statins   Review of Systems Review of Systems  Respiratory:  Positive for cough.   All other systems reviewed and are negative.   Physical Exam Triage Vital Signs ED Triage Vitals  Enc Vitals Group     BP 05/18/21 1040 108/72     Pulse Rate 05/18/21 1040 84     Resp 05/18/21 1040 16     Temp 05/18/21 1040 98.6 F (37 C)     Temp Source 05/18/21 1040 Oral     SpO2 05/18/21 1040 96 %     Weight 05/18/21 1043 175 lb (79.4 kg)     Height 05/18/21 1043 _0  (1.727 m)     Head Circumference --      Peak Flow --      Pain Score 05/18/21 1043 0     Pain Loc --      Pain Edu? --      Excl. in [place]? --    No data found.  Updated Vital Signs BP 108/72 (BP Location: Right Arm)   Pulse 84   Temp 98.6 F (37 C) (Oral)   Resp 16   Ht _1  (1.727 m)   Wt 175 lb (79.4 kg)   SpO2 96%   BMI 26.61 kg/m       Physical Exam Vitals and nursing note reviewed.  Constitutional:      General: She is not in acute distress.  Appearance: Normal appearance. She is obese. She is not ill-appearing.  HENT:     Head: Normocephalic and atraumatic.     Right Ear: Tympanic membrane, ear canal and external ear normal.     Left Ear: Tympanic membrane, ear canal and external ear normal.     Mouth/Throat:     Mouth: Mucous membranes are moist.     Pharynx: Oropharynx is clear.  Eyes:     Extraocular Movements: Extraocular movements intact.     Conjunctiva/sclera: Conjunctivae normal.      Pupils: Pupils are equal, round, and reactive to light.  Cardiovascular:     Rate and Rhythm: Normal rate and regular rhythm.     Pulses: Normal pulses.     Heart sounds: Normal heart sounds.  Pulmonary:     Effort: Pulmonary effort is normal.     Breath sounds: Normal breath sounds.  Musculoskeletal:        General: Normal range of motion.     Cervical back: Normal range of motion and neck supple.  Skin:    General: Skin is warm and dry.  Neurological:     General: No focal deficit present.     Mental Status: She is alert and oriented to person, place, and time.     UC Treatments / Results  Labs (all labs ordered are listed, but only abnormal results are displayed) Labs Reviewed - No data to display  EKG   Radiology DG Chest 2 View  Result Date: 05/18/2021 CLINICAL DATA:  Cough since 05/06/2021. EXAM: CHEST - 2 VIEW COMPARISON:  05/11/2017 FINDINGS: Heart size is normal. There is mild perihilar peribronchial thickening. There are no focal consolidations. No pleural effusions or pulmonary edema. Remote spinal fixation. IMPRESSION: Bronchitic changes. Electronically Signed   By: Nolon Nations M.D.   On: 05/18/2021 11:02    Procedures Procedures (including critical care time)  Medications Ordered in UC Medications - No data to display  Initial Impression / Assessment and Plan / UC Course  I have reviewed the triage vital signs and the nursing notes.  Pertinent labs & imaging results that were available during my care of the patient were reviewed by me and considered in my medical decision making (see chart for details).  Clinical Course as of 05/18/21 1141  Sat May 18, 2021  1129 Pulse Rate: 84 [MR]    Clinical Course User Index [MR] Eliezer Lofts, FNP    MDM: 1.  Cough-Next Medrol Dosepak and Tessalon Perles. Advised patient to take medication as directed with food to completion.  Advised patient may use Tessalon Perles daily or as needed for cough.  Patient  discharged home, hemodynamically stable. Final Clinical Impressions(s) / UC Diagnoses   Final diagnoses:  Cough, unspecified type     Discharge Instructions      Advised patient to take medication as directed with food to completion.  Advised patient may use Tessalon Perles daily or as needed for cough.     ED Prescriptions     Medication Sig Dispense Auth. Provider   methylPREDNISolone (MEDROL DOSEPAK) 4 MG TBPK tablet Take as directed 1 each Eliezer Lofts, FNP   benzonatate (TESSALON) 200 MG capsule Take 1 capsule (200 mg total) by mouth 3 (three) times daily as needed for up to 7 days for cough. 40 capsule Eliezer Lofts, FNP      PDMP not reviewed this encounter.   Eliezer Lofts, [place] 05/18/21 1141

## 2021-05-18 NOTE — Discharge Instructions (Addendum)
Advised patient to take medication as directed with food to completion.  Advised patient may use Tessalon Perles daily or as needed for cough.

## 2021-05-20 ENCOUNTER — Encounter: Payer: Self-pay | Admitting: Family

## 2021-05-20 ENCOUNTER — Other Ambulatory Visit (HOSPITAL_COMMUNITY): Payer: Self-pay

## 2021-05-20 ENCOUNTER — Other Ambulatory Visit: Payer: Self-pay

## 2021-05-20 MED ORDER — BENZONATATE 100 MG PO CAPS
100.0000 mg | ORAL_CAPSULE | Freq: Three times a day (TID) | ORAL | 0 refills | Status: DC
  Filled 2021-05-20: qty 40, 13d supply, fill #0

## 2021-05-20 NOTE — Telephone Encounter (Signed)
Rx sent 

## 2021-05-27 ENCOUNTER — Encounter: Payer: Self-pay | Admitting: Family Medicine

## 2021-05-27 MED ORDER — ZOLPIDEM TARTRATE 5 MG PO TABS: 5.0000 mg | ORAL_TABLET | Freq: Every evening | ORAL | 1 refills | Status: DC | PRN

## 2021-05-27 NOTE — Telephone Encounter (Signed)
Ambien Rx.

## 2021-05-30 ENCOUNTER — Telehealth: Payer: Self-pay | Admitting: *Deleted

## 2021-05-30 NOTE — Telephone Encounter (Signed)
Prior auth started via cover my meds.  Awaiting determination.  Key: [id number]

## 2021-05-31 ENCOUNTER — Encounter: Payer: Self-pay | Admitting: Family Medicine

## 2021-05-31 ENCOUNTER — Other Ambulatory Visit: Payer: Self-pay | Admitting: Family Medicine

## 2021-05-31 MED ORDER — DAPAGLIFLOZIN PROPANEDIOL 10 MG PO TABS
10.0000 mg | ORAL_TABLET | Freq: Every day | ORAL | 3 refills | Status: DC
Start: 2021-05-31 — End: 2021-06-08

## 2021-05-31 NOTE — Telephone Encounter (Signed)
Approvedon November 3 Effective from 05/30/2021 through 05/30/2022.

## 2021-06-04 ENCOUNTER — Encounter: Payer: Self-pay | Admitting: Family Medicine

## 2021-06-05 ENCOUNTER — Telehealth: Payer: Self-pay

## 2021-06-05 NOTE — Telephone Encounter (Signed)
PA started via CoverMyMeds: Dakotah Osterlund Key: [employer]

## 2021-06-06 ENCOUNTER — Telehealth: Payer: Self-pay | Admitting: Family Medicine

## 2021-06-06 NOTE — Telephone Encounter (Signed)
Ashley from [employer] will be faxing prescription denial over from medication(Farxiga). If any questions please contact tel: [phone] option 5

## 2021-06-07 ENCOUNTER — Encounter: Payer: Self-pay | Admitting: Family Medicine

## 2021-06-08 MED ORDER — CANAGLIFLOZIN 300 MG PO TABS: 300.0000 mg | ORAL_TABLET | Freq: Every day | ORAL | 6 refills | Status: DC

## 2021-06-10 ENCOUNTER — Encounter: Payer: Self-pay | Admitting: Family Medicine

## 2021-06-14 ENCOUNTER — Other Ambulatory Visit: Payer: Self-pay

## 2021-06-14 DIAGNOSIS — E088 Diabetes mellitus due to underlying condition with unspecified complications: Secondary | ICD-10-CM

## 2021-06-17 ENCOUNTER — Other Ambulatory Visit: Payer: Self-pay

## 2021-06-17 MED ORDER — LOSARTAN POTASSIUM 50 MG PO TABS: ORAL_TABLET | Freq: Every day | ORAL | 1 refills | Status: DC

## 2021-06-18 ENCOUNTER — Other Ambulatory Visit: Payer: Self-pay | Admitting: Family Medicine

## 2021-06-18 DIAGNOSIS — E088 Diabetes mellitus due to underlying condition with unspecified complications: Secondary | ICD-10-CM

## 2021-06-18 MED ORDER — EXENATIDE ER 2 MG/0.85ML ~~LOC~~ AUIJ: AUTO-INJECTOR | SUBCUTANEOUS | 12 refills | Status: DC

## 2021-06-19 ENCOUNTER — Telehealth: Payer: Self-pay

## 2021-06-19 ENCOUNTER — Ambulatory Visit (INDEPENDENT_AMBULATORY_CARE_PROVIDER_SITE_OTHER): Payer: Medicare Other

## 2021-06-19 ENCOUNTER — Other Ambulatory Visit: Payer: Self-pay

## 2021-06-19 ENCOUNTER — Ambulatory Visit (INDEPENDENT_AMBULATORY_CARE_PROVIDER_SITE_OTHER): Payer: Medicare Other | Admitting: Sports Medicine

## 2021-06-19 DIAGNOSIS — M7701 Medial epicondylitis, right elbow: Secondary | ICD-10-CM

## 2021-06-19 NOTE — Progress Notes (Signed)
    Procedures performed today:    Procedure: Real-time Ultrasound Guided injection of the right common flexor tendon origin Device: Samsung HS60  Verbal informed consent obtained.  Time-out conducted.  Noted no overlying erythema, induration, or other signs of local infection.  Skin prepped in a sterile fashion.  Local anesthesia: Topical Ethyl chloride.  With sterile technique and under real time ultrasound guidance: Noted minimal gaps in the tendon, 1 cc Kenalog 40, 1 cc lidocaine, 1 cc bupivacaine injected easily Completed without difficulty  Advised to call if fevers/chills, erythema, induration, drainage, or persistent bleeding.  Images permanently stored and available for review in PACS.  Impression: Technically successful ultrasound guided injection.  Independent interpretation of notes and tests performed by another provider:   None.  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 Repeat steroid injection today, we will consider PRP for the next injection.    ___________________________________________ Gwen Her. Dianah Field, M.D., ABFM., CAQSM. Primary Care and [place] Instructor of [place] [facility]

## 2021-06-19 NOTE — Assessment & Plan Note (Signed)
This is a very pleasant 70 year old female, recurrence of medial epicondylitis on the right side, last injected about 4 months ago. Repeat steroid injection today, we will consider PRP for the next injection.

## 2021-06-19 NOTE — Telephone Encounter (Signed)
Debbie Hayes Key: [id number] - Rx #: [id number] Need help? Call us at [phone] Outcome Approved today Effective from 06/19/2021 through 06/19/2022.

## 2021-06-19 NOTE — Telephone Encounter (Signed)
PA started via CoverMyMeds:  [place]: [id number] - Rx #: [id number]

## 2021-07-15 ENCOUNTER — Encounter: Payer: Self-pay | Admitting: Family Medicine

## 2021-07-23 ENCOUNTER — Other Ambulatory Visit: Payer: Self-pay | Admitting: Family Medicine

## 2021-07-23 DIAGNOSIS — B379 Candidiasis, unspecified: Secondary | ICD-10-CM

## 2021-07-26 LAB — HM MAMMOGRAPHY

## 2021-07-30 ENCOUNTER — Encounter: Payer: Self-pay | Admitting: Family Medicine

## 2021-07-30 MED ORDER — CEFDINIR 300 MG PO CAPS: 300.0000 mg | ORAL_CAPSULE | Freq: Two times a day (BID) | ORAL | 0 refills | Status: DC

## 2021-07-31 ENCOUNTER — Encounter: Payer: Self-pay | Admitting: Family Medicine

## 2021-08-11 ENCOUNTER — Other Ambulatory Visit: Payer: Self-pay | Admitting: Neurology

## 2021-08-11 ENCOUNTER — Other Ambulatory Visit: Payer: Self-pay | Admitting: Family Medicine

## 2021-08-16 ENCOUNTER — Encounter: Payer: Self-pay | Admitting: Family Medicine

## 2021-08-26 ENCOUNTER — Encounter: Payer: Self-pay | Admitting: Family Medicine

## 2021-08-26 MED ORDER — OXYCODONE HCL 5 MG PO TABS: ORAL_TABLET | ORAL | 0 refills | Status: DC

## 2021-09-20 ENCOUNTER — Other Ambulatory Visit: Payer: Self-pay | Admitting: Family Medicine

## 2021-09-20 ENCOUNTER — Other Ambulatory Visit: Payer: Self-pay | Admitting: *Deleted

## 2021-09-20 ENCOUNTER — Telehealth: Payer: Self-pay

## 2021-09-20 DIAGNOSIS — G43709 Chronic migraine without aura, not intractable, without status migrainosus: Secondary | ICD-10-CM

## 2021-09-20 MED ORDER — TOPIRAMATE ER 100 MG PO CAP24
100.0000 mg | ORAL_CAPSULE | Freq: Every day | ORAL | 3 refills | Status: DC
Start: 2021-09-20 — End: 2022-06-27

## 2021-09-20 NOTE — Telephone Encounter (Signed)
PA initiated via Covermymeds; KEY: [id number]. Awaiting determination.

## 2021-09-20 NOTE — Telephone Encounter (Signed)
PA approved.   Effective from 09/20/2021 through 09/20/2022.

## 2021-09-26 ENCOUNTER — Ambulatory Visit: Payer: Medicare Other | Admitting: Medical

## 2021-10-04 ENCOUNTER — Encounter: Payer: Self-pay | Admitting: Sports Medicine

## 2021-10-07 ENCOUNTER — Encounter: Payer: Self-pay | Admitting: Family Medicine

## 2021-10-08 ENCOUNTER — Ambulatory Visit: Payer: Medicare Other | Admitting: Sports Medicine

## 2021-10-09 ENCOUNTER — Other Ambulatory Visit: Payer: Self-pay | Admitting: Family Medicine

## 2021-10-11 ENCOUNTER — Other Ambulatory Visit: Payer: Self-pay | Admitting: Family Medicine

## 2021-10-17 ENCOUNTER — Other Ambulatory Visit: Payer: Self-pay

## 2021-10-17 ENCOUNTER — Other Ambulatory Visit: Payer: Self-pay | Admitting: Family Medicine

## 2021-10-17 ENCOUNTER — Encounter: Payer: Self-pay | Admitting: Family Medicine

## 2021-10-17 DIAGNOSIS — J3089 Other allergic rhinitis: Secondary | ICD-10-CM

## 2021-10-17 DIAGNOSIS — N390 Urinary tract infection, site not specified: Secondary | ICD-10-CM

## 2021-10-17 MED ORDER — NITROFURANTOIN MACROCRYSTAL 50 MG PO CAPS: ORAL_CAPSULE | Freq: Every day | ORAL | 3 refills | Status: DC

## 2021-10-17 MED ORDER — MONTELUKAST SODIUM 10 MG PO TABS: ORAL_TABLET | Freq: Every day | ORAL | 3 refills | Status: DC

## 2021-10-17 NOTE — Telephone Encounter (Signed)
Rx sent for Montelukast.  ?

## 2021-10-23 ENCOUNTER — Ambulatory Visit: Payer: Medicare Other | Admitting: Family Medicine

## 2021-10-30 ENCOUNTER — Encounter: Payer: Self-pay | Admitting: Family Medicine

## 2021-10-30 ENCOUNTER — Ambulatory Visit: Payer: Medicare Other | Admitting: Family Medicine

## 2021-11-12 ENCOUNTER — Ambulatory Visit (INDEPENDENT_AMBULATORY_CARE_PROVIDER_SITE_OTHER): Payer: Medicare Other | Admitting: Sports Medicine

## 2021-11-12 ENCOUNTER — Ambulatory Visit (INDEPENDENT_AMBULATORY_CARE_PROVIDER_SITE_OTHER): Payer: Medicare Other

## 2021-11-12 DIAGNOSIS — M7701 Medial epicondylitis, right elbow: Secondary | ICD-10-CM | POA: Diagnosis not present

## 2021-11-12 DIAGNOSIS — M65311 Trigger thumb, right thumb: Secondary | ICD-10-CM | POA: Diagnosis not present

## 2021-11-12 NOTE — Progress Notes (Addendum)
Therapist, music at [facility] ?[address], [address] ?[place], [place] [address] ?[phone] ?Fax [phone] ? ?Date:  11/14/2021  ? ?Name:  Debbie Hayes   DOB:  [birth year]   MRN:  [id number] ? ?PCP:  Darreld Mclean, MD  ? ? ?Chief Complaint: Medication Refill (Concerns/ questions: 1.Nail problem, 2. blood sugars,  3.migraine med change 4. Skin lesion. 5. UTI hx. ) ? ? ?History of Present Illness: ? ?Debbie Hayes is a 71 y.o. very pleasant female patient who presents with the following: ? ?Patient seen today for follow-up ?Most recent visit with myself was in July- history of migraine headache, sleep apnea, diabetes, hypothyroidism, hyperlipidemia ? ?Shingrix-she is aware ?COVID-19 booster ?Foot exam is due-update today ?Eye exam- UTD  ?A1c, labs are due ? ?Colonoscopy due next year ?Mammogram up-to-date ?Bone density can be updated this year ?Status post hysterectomy in the 90s ? ?Patient is concerned about low blood sugars.  We recently had her stop Tradjenta due to questionable benefit from using this drug with a GLP-1 ?She continues to take her GLP-1  She is allergic to sulfa so cannot use glipizide/Amaryl ?She cannot use metformin with her extended release Topamax; the Topamax is very helpful for her migraine prevention so she does not want to stop using it ? ?We have discussed questionable benefit from adding Tradjenta, but Debbie Hayes does feel her blood sugars have gone up since she stopped using it.  She would like to restart, I do not think this will be harmful.   ?Lab Results  ?Component Value Date  ? HGBA1C 7.2 (H) 11/14/2021  ? ?Invokana 300/transition to [place] 10 when she runs out ?Repatha ?Bydureon2 mg weekly ?Levothyroxine 125 ?Losartan 50 ?Metoprolol XL 50 ?Montelukast ?Trokendi ER- for migraine prophylaxis ?Ambien ?Trazodone ?She uses Ubrelyvy as needed for migraine- however it still takes about 12 hours for sx relief.  We wonder if a different medication will give  her faster release ?We weill try nurtec for her  ? ?Se is taking esopmetrozole 40 daily -discussed need to keep on top of bone density ? ?Left great toenail -she has noted a dark spot under the distal nail and was concerned about melanoma.  It does seem to be growing out however, she thinks this is due to shoe trauma.  She wanted to mention it in case it was a concern, but on review of photos on her phone it does not like the spot is growing out.  I asked her to please let me know if it does not continue to grow out and eventually disappear ? ?She has self treated for UTI a couple of times recently with cefdinir.  ?She is still on prophylactic macrobid  ?She uses cipro very occasionally if she has a UTI but does not seem to respond to cefdinir ?Will provide sterile urine cups for her to use if she suspect a UTI, to allow for a culture  ? ?BP Readings from Last 3 Encounters:  ?11/14/21 110/60  ?05/18/21 108/72  ?05/14/21 (!) 102/59  ? ?Pulse Readings from Last 3 Encounters:  ?11/14/21 (!) 50  ?05/18/21 84  ?05/14/21 75  ? ? ?Patient Active Problem List  ? Diagnosis Date Noted  ? Acute cystitis without hematuria 05/14/2021  ? Medial epicondylitis, right 03/25/2021  ? Trigger thumb, right thumb 11/19/2020  ? Right wrist pain 10/23/2020  ? Osteoporosis 04/05/2020  ? Trigger finger, left middle finger 07/27/2019  ? Chronic neck pain 11/10/2018  ?  Orthopedic hardware present 02/17/2018  ? Diabetes mellitus due to underlying condition with complication (Debbie Hayes) 73/53/2992  ? Intractable chronic migraine without aura and with status migrainosus 12/15/2017  ? Arthralgia 12/15/2017  ? Joint pain 12/15/2017  ? Migraine, transformed 12/15/2017  ? Refractory migraine without aura 12/15/2017  ? Pain of left shoulder joint on movement 10/19/2017  ? OSA (obstructive sleep apnea) 06/26/2017  ? Snoring 03/25/2016  ? Palpitations 03/25/2016  ? Headache 03/25/2016  ? Sensory disorder of trigeminal nerve 11/26/2015  ? Spondylolisthesis of  lumbar region 08/09/2014  ? Lumbar adjacent segment disease with spondylolisthesis 08/09/2014  ? Back pain 02/17/2014  ? Macular degeneration 02/17/2014  ? Backache 02/17/2014  ? Other and unspecified hyperlipidemia 10/14/2013  ? Hyperlipidemia 10/14/2013  ? Onychomycosis 12/01/2012  ? Arthritis 04/12/2012  ? Hypothyroidism 04/12/2012  ? ? ?Past Medical History:  ?Diagnosis Date  ? Allergy   ? Cancer [facility])   ? thyroid cancer  ? Cataract   ? Chronic migraine BOTOX INJECTION EVERY 3 MONTHS  ? Chronic pain in right shoulder   ? Constipation   ? Diabetes mellitus   ? Disorder of inner ear CAUSES VERTIGO OCCASIONALLY  ? Fibromyalgia   ? GERD (gastroesophageal reflux disease)   ? Hemorrhoid   ? History of bladder infections   ? History of thyroid cancer 2009  S/P TOTAL THYROIDECTOMY AND RADIATION  ? Hyperlipidemia   ? Hypertension CARDIOLOGIST- DR Wynonia Lawman- WILL REQUEST LATE NOTE  ? DENIES S & S  ? Hypothyroidism   ? Insomnia   ? Left shoulder pain   ? Macular degeneration   ? MRSA infection   ? nasal treated more than 15 years ago  ? Normal nuclear stress test 01-25-2009  ? OA (osteoarthritis) JOINTS  ? Osteopenia   ? Painful orthopaedic hardware [facility])   ? left foot  ? PONV (postoperative nausea and vomiting) SEVERE  ? Rotator cuff disorder LEFT SHOULDER RTC IMPINGMENT  ? Sleep apnea   ? mouth guard, no cpap   ? Sleep apnea in adult 06/26/2017  ? Spondylolisthesis of lumbar region   ? 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  ? Wears glasses   ? ? ?Past Surgical History:  ?Procedure Laterality Date  ? BACK SURGERY    ? Dr Arnoldo Morale- spondylosis  ? BILATERAL CARPAL TUNNEL RELEASE  1994  ? BILATERAL ELBOW SURG.  1999  ? [place]  ? POST-OP URETER REPAIR 12 DAYS AFTER   ? CATARACT EXTRACTION, BILATERAL    ? COLONOSCOPY    ? EYE SURGERY    ? FOOT ARTHRODESIS Left 07/10/2016  ? Procedure: LEFT 2ND TARSAL METATARSAL ARTHRODESIS  GASTROC RECESSION LEFT LAPIDUS MODIFIED MCBRIDE BUIONECTOMY;  Surgeon: Wylene Simmer, MD;  Location: [place];  Service: Orthopedics;  Laterality: Left;  ? GASTROC RECESSION EXTREMITY Left 07/10/2016  ? Procedure: GASTROC RECESSION EXTREMITY;  Surgeon: Wylene Simmer, MD;  Location: [place];  Service: Orthopedics;  Laterality: Left;  ? HARDWARE REMOVAL Left 05/20/2018  ? Procedure: Left foot removal of deep implants;  Surgeon: Wylene Simmer, MD;  Location: [place];  Service: Orthopedics;  Laterality: Left;  77min  ? LEFT THUMB JOINT REPLACEMENT  2005  ? RIGHT DONE IN 2004  ? OOPHORECTOMY    ? POLYPECTOMY    ? RIGHT KNEE ARTHROSCOPY  X2  BEFORE 2011  ? RIGHT KNEE ARTHROSCOPY/ PARTIAL LATERAL MENISECTOMY/ TRICOMPARTMENT CHONDROPLASTY/ DECOMPRESSION CYST  10-26-2009  ? RIGHT KNEE CLOSED MANIPULATION  09-11-2010  ? RIGHT SHOULDER ARTHROSCOPY  2010  &  2004  ? TIBIA CYST REMOVED AND ORIF LEG FX  1958  ? TONSILLECTOMY  1968  ? TOTAL KNEE ARTHROPLASTY  07-16-2010  ? RIGHT  ? TOTAL THYROIDECTOMY  2009  ? CANCER  (POST-OP BLEED)  AND RADIATION [place]  ? trigger fingers Right 2013  ? 3rd and 4th fingers  ? UPPER GASTROINTESTINAL ENDOSCOPY    ? VAGINAL HYSTERECTOMY  1990  ? ? ?Social History  ? ?Tobacco Use  ? Smoking status: Never  ? Smokeless tobacco: Never  ?Vaping Use  ? Vaping Use: Never used  ?Substance Use Topics  ? Alcohol use: Yes  ?  Comment: RARE  ? Drug use: No  ? ? ?Family History  ?Problem Relation Age of Onset  ? Colon polyps Sister 45  ? Alzheimer's disease Mother   ? Heart disease Father   ? Cirrhosis Father   ? Heart attack Father   ? Macular degeneration Father   ? Colon cancer Neg Hx   ? ? ?Allergies  ?Allergen Reactions  ? Sulfa Antibiotics Hives  ? Statins Other (See Comments)  ?  Pt has tried multiple and cannot tolerate  ? ? ?Medication list has been reviewed and updated. ? ?Current Outpatient Medications on File Prior to Visit  ?Medication Sig Dispense Refill  ? blood glucose meter kit and supplies KIT Test blood sugar once daily. Dx  code: E11.9 1 each 0  ? Blood Glucose Monitoring Suppl (FREESTYLE LITE) DEVI Check blood sugar no more than twice daily 1 each 0  ? Evolocumab (REPATHA) 140 MG/ML SOSY Inject 140 mg into the skin every 14 (

## 2021-11-12 NOTE — Patient Instructions (Addendum)
It was great to see you again today, I will be in touch with your labs asap ?We will try the nurtec for your headaches ?We can go back on Tradjenta ?I think the spot on your toe is ok- let me know if not growing out as expected  ? ?

## 2021-11-12 NOTE — Assessment & Plan Note (Signed)
Injection as above.  ?Patient declines PRP. ?

## 2021-11-12 NOTE — Progress Notes (Signed)
? ? ?  Procedures performed today:   ? ?Procedure: Real-time Ultrasound Guided injection of the right FPL tendon sheath ?Device: Samsung HS60  ?Verbal informed consent obtained.  ?Time-out conducted.  ?Noted no overlying erythema, induration, or other signs of local infection.  ?Skin prepped in a sterile fashion.  ?Local anesthesia: Topical Ethyl chloride.  ?With sterile technique and under real time ultrasound guidance:  Noted slightly edematous tendon sheath, 1/2 cc lidocaine, 1/2 cc kenalog 40 injected easily.   ?Completed without difficulty  ?Advised to call if fevers/chills, erythema, induration, drainage, or persistent bleeding.  ?Images permanently stored and available for review in PACS.  ?Impression: Technically successful ultrasound guided injection. ? ?Procedure: Real-time Ultrasound Guided injection of the right common extensor tendon origin ?Device: Samsung HS60  ?Verbal informed consent obtained.  ?Time-out conducted.  ?Noted no overlying erythema, induration, or other signs of local infection.  ?Skin prepped in a sterile fashion.  ?Local anesthesia: Topical Ethyl chloride.  ?With sterile technique and under real time ultrasound guidance: Noted minimal gaps in the tendon, 1 cc Kenalog 40, 1 cc lidocaine, 1 cc bupivacaine injected easily ?Completed without difficulty  ?Advised to call if fevers/chills, erythema, induration, drainage, or persistent bleeding.  ?Images permanently stored and available for review in PACS.  ?Impression: Technically successful ultrasound guided injection. ? ?Independent interpretation of notes and tests performed by another provider:  ? ?None. ? ?Brief History, Exam, Impression, and Recommendations:   ? ?Trigger thumb, right thumb ?Pleasant 71 year old female returns, chronic right trigger thumb, last injected almost a year ago, repeat flexor pollicis longus tendon sheath injection with ultrasound guidance, she is also interested in considering surgical management, I would  like Dr. Amedeo Plenty to weigh in. ? ?Medial epicondylitis, right ?Injection as above.  ?Patient declines PRP. ? ? ? ?___________________________________________ ?Gwen Her. Dianah Field, M.D., ABFM., CAQSM. ?Primary Care and Sports Medicine ?[place] ? ?Adjunct Instructor of Family Medicine  ?[employer] ?

## 2021-11-12 NOTE — Assessment & Plan Note (Signed)
Pleasant 71 year old female returns, chronic right trigger thumb, last injected almost a year ago, repeat flexor pollicis longus tendon sheath injection with ultrasound guidance, she is also interested in considering surgical management, I would like Dr. Amedeo Plenty to weigh in. ?

## 2021-11-14 ENCOUNTER — Encounter: Payer: Self-pay | Admitting: Family Medicine

## 2021-11-14 ENCOUNTER — Ambulatory Visit (INDEPENDENT_AMBULATORY_CARE_PROVIDER_SITE_OTHER): Payer: Medicare Other | Admitting: Family Medicine

## 2021-11-14 VITALS — BP 110/60 | HR 50 | Temp 98.2°F | Resp 18 | Ht 68.0 in | Wt 175.0 lb

## 2021-11-14 DIAGNOSIS — E039 Hypothyroidism, unspecified: Secondary | ICD-10-CM | POA: Diagnosis not present

## 2021-11-14 DIAGNOSIS — G4733 Obstructive sleep apnea (adult) (pediatric): Secondary | ICD-10-CM | POA: Diagnosis not present

## 2021-11-14 DIAGNOSIS — E782 Mixed hyperlipidemia: Secondary | ICD-10-CM | POA: Diagnosis not present

## 2021-11-14 DIAGNOSIS — E088 Diabetes mellitus due to underlying condition with unspecified complications: Secondary | ICD-10-CM

## 2021-11-14 DIAGNOSIS — Z130 Encounter for screening for diseases of the blood and blood-forming organs and certain disorders involving the immune mechanism: Secondary | ICD-10-CM

## 2021-11-14 DIAGNOSIS — E118 Type 2 diabetes mellitus with unspecified complications: Secondary | ICD-10-CM | POA: Diagnosis not present

## 2021-11-14 DIAGNOSIS — G43909 Migraine, unspecified, not intractable, without status migrainosus: Secondary | ICD-10-CM

## 2021-11-14 DIAGNOSIS — Z1329 Encounter for screening for other suspected endocrine disorder: Secondary | ICD-10-CM

## 2021-11-14 LAB — MICROALBUMIN / CREATININE URINE RATIO
Creatinine,U: 40.6 mg/dL
Microalb Creat Ratio: 1.7 mg/g (ref 0.0–30.0)
Microalb, Ur: <0.7 mg/dL (ref 0.0–1.9)

## 2021-11-14 LAB — LIPID PANEL
Cholesterol: 148 mg/dL (ref 0–200)
HDL: 60 mg/dL (ref >39.00)
LDL Cholesterol: 61 mg/dL (ref 0–99)
NonHDL: 87.76
Total CHOL/HDL Ratio: 2
Triglycerides: 135 mg/dL (ref 0.0–149.0)
VLDL: 27 mg/dL (ref 0.0–40.0)

## 2021-11-14 LAB — COMPREHENSIVE METABOLIC PANEL
ALT: 19 U/L (ref 0–35)
AST: 25 U/L (ref 0–37)
Albumin: 4.3 g/dL (ref 3.5–5.2)
Alkaline Phosphatase: 82 U/L (ref 39–117)
BUN: 19 mg/dL (ref 6–23)
CO2: 25 mEq/L (ref 19–32)
Calcium: 9.2 mg/dL (ref 8.4–10.5)
Chloride: 106 mEq/L (ref 96–112)
Creatinine, Ser: 0.69 mg/dL (ref 0.40–1.20)
GFR: 86.75 mL/min (ref >60.00)
Glucose, Bld: 186 mg/dL — ABNORMAL HIGH (ref 70–99)
Potassium: 3.5 mEq/L (ref 3.5–5.1)
Sodium: 139 mEq/L (ref 135–145)
Total Bilirubin: 0.3 mg/dL (ref 0.2–1.2)
Total Protein: 6.7 g/dL (ref 6.0–8.3)

## 2021-11-14 LAB — CBC
HCT: 44.5 % (ref 36.0–46.0)
Hemoglobin: 14.6 g/dL (ref 12.0–15.0)
MCHC: 32.8 g/dL (ref 30.0–36.0)
MCV: 85 fl (ref 78.0–100.0)
Platelets: 210 10*3/uL (ref 150.0–400.0)
RBC: 5.23 Mil/uL — ABNORMAL HIGH (ref 3.87–5.11)
RDW: 14 % (ref 11.5–15.5)
WBC: 8.6 10*3/uL (ref 4.0–10.5)

## 2021-11-14 LAB — TSH: TSH: 1.23 u[IU]/mL (ref 0.35–5.50)

## 2021-11-14 LAB — HEMOGLOBIN A1C: Hgb A1c MFr Bld: 7.2 % — ABNORMAL HIGH (ref 4.6–6.5)

## 2021-11-14 MED ORDER — NURTEC 75 MG PO TBDP: ORAL_TABLET | ORAL | 5 refills | Status: DC

## 2021-11-14 MED ORDER — LINAGLIPTIN 5 MG PO TABS: ORAL_TABLET | ORAL | 3 refills | Status: DC

## 2021-11-15 ENCOUNTER — Other Ambulatory Visit: Payer: Self-pay | Admitting: Family Medicine

## 2021-11-18 ENCOUNTER — Telehealth: Payer: Self-pay

## 2021-11-18 DIAGNOSIS — G43909 Migraine, unspecified, not intractable, without status migrainosus: Secondary | ICD-10-CM

## 2021-11-18 NOTE — Telephone Encounter (Signed)
PA initiated via Covermymeds; KEY: [id number]. Awaiting determination.  ?

## 2021-11-19 ENCOUNTER — Encounter: Payer: Self-pay | Admitting: Family Medicine

## 2021-11-19 MED ORDER — NURTEC 75 MG PO TBDP: ORAL_TABLET | ORAL | 5 refills | Status: DC

## 2021-11-19 NOTE — Addendum Note (Signed)
Addended byDamita Dunnings D on: 11/19/2021 09:54 AM ? ? Modules accepted: Orders ? ?

## 2021-11-19 NOTE — Telephone Encounter (Signed)
PA approved. Effective from 11/18/2021 through 11/19/2022. ?

## 2021-11-19 NOTE — Telephone Encounter (Signed)
PA approved. However, Nurtec is only covered for 16 tablets per 30 days.  ?

## 2021-11-20 ENCOUNTER — Other Ambulatory Visit: Payer: Self-pay | Admitting: Family Medicine

## 2021-11-20 DIAGNOSIS — B379 Candidiasis, unspecified: Secondary | ICD-10-CM

## 2021-11-25 ENCOUNTER — Encounter: Payer: Self-pay | Admitting: Family Medicine

## 2021-12-10 ENCOUNTER — Other Ambulatory Visit: Payer: Self-pay | Admitting: Family Medicine

## 2022-01-07 ENCOUNTER — Other Ambulatory Visit: Payer: Self-pay | Admitting: Family Medicine

## 2022-01-07 DIAGNOSIS — G4700 Insomnia, unspecified: Secondary | ICD-10-CM

## 2022-01-15 ENCOUNTER — Ambulatory Visit (INDEPENDENT_AMBULATORY_CARE_PROVIDER_SITE_OTHER): Payer: Medicare Other

## 2022-01-15 DIAGNOSIS — Z0000 Encounter for general adult medical examination without abnormal findings: Secondary | ICD-10-CM

## 2022-01-15 NOTE — Progress Notes (Cosign Needed)
Subjective:   Debbie Hayes is a 71 y.o. female who presents for an Initial Medicare Annual Wellness Visit.  I connected with  [name] on 01/15/22 by a audio enabled telemedicine application and verified that I am speaking with the correct person using two identifiers.  Patient Location: Home  Provider Location: Office/Clinic  I discussed the limitations of evaluation and management by telemedicine. The patient expressed understanding and agreed to proceed.   Review of Systems      Cardiac Risk Factors include: advanced age (>56men, >68 women);diabetes mellitus;dyslipidemia     Objective:    Today's Vitals   01/15/22 1109  PainSc: 5    There is no height or weight on file to calculate BMI.     01/15/2022   11:05 AM 04/28/2019   11:22 AM 05/20/2018   10:05 AM 05/12/2018    3:59 PM 05/10/2018    2:34 PM 12/04/2017    7:33 AM 11/20/2017   11:00 AM  Advanced Directives  Does Patient Have a Medical Advance Directive? Yes Yes Yes No No Yes Yes  Type of Paramedic of [place];Out of facility DNR (pink MOST or yellow form);Living will [place];Living will [place];Living will   [place];Living will Living will  Does patient want to make changes to medical advance directive? No - Patient declined  No - Patient declined      Copy of [place] in Chart? No - copy requested No - copy requested No - copy requested      Would patient like information on creating a medical advance directive?    No - Patient declined No - Patient declined      Current Medications (verified) Outpatient Encounter Medications as of 01/15/2022  Medication Sig   blood glucose meter kit and supplies KIT Test blood sugar once daily. Dx code: E11.9   Blood Glucose Monitoring Suppl (FREESTYLE LITE) DEVI Check blood sugar no more than twice daily   Evolocumab (REPATHA) 140 MG/ML SOSY Inject 140 mg  into the skin every 14 (fourteen) days.   Exenatide ER 2 MG/0.85ML AUIJ INJECT 2 MG INTO THE SKIN ONCE A WEEK.   fluconazole (DIFLUCAN) 150 MG tablet TAKE 1 TABLET BY MOUTH ONCE FOR 1 DOSE. CAN REPEAT WEEKLY AS NEEDED   glucose blood (FREESTYLE LITE) test strip CHECK BLOOD SUGARS NO MORE THAN TWICE DAILY   Lancets (FREESTYLE) lancets Check blood sugars no more than twice daily   levothyroxine (SYNTHROID) 125 MCG tablet Take 1 tablet (125 mcg total) by mouth daily before breakfast.   linagliptin (TRADJENTA) 5 MG TABS tablet TAKE 1 TABLET BY MOUTH DAILY.   losartan (COZAAR) 50 MG tablet TAKE 1 TABLET BY MOUTH DAILY   Magnesium 400 MG CAPS Take by mouth.   metoprolol succinate (TOPROL-XL) 50 MG 24 hr tablet TAKE 1 TABLET BY MOUTH DAILY WITH OR IMMEDIATELY FOLLOWING A MEAL   montelukast (SINGULAIR) 10 MG tablet TAKE 1 TABLET BY MOUTH AT BEDTIME   nitrofurantoin (MACRODANTIN) 50 MG capsule TAKE 1 CAPSULE BY MOUTH AT BEDTIME   ondansetron (ZOFRAN) 4 MG tablet Take 1 tablet (4 mg total) by mouth every 8 (eight) hours as needed for nausea or vomiting.   oxyCODONE (OXY IR/ROXICODONE) 5 MG immediate release tablet TAKE 1-1.5 TABLETS (5-7.5 MG TOTAL) BY MOUTH EVERY 6 (SIX) HOURS AS NEEDED FOR SEVERE PAIN.   phenazopyridine (PYRIDIUM) 200 MG tablet Take 1 tablet (200 mg total)  by mouth 3 (three) times daily as needed for pain. For UTI   Riboflavin (VITAMIN B2 PO) Take 400 mg by mouth.   Rimegepant Sulfate (NURTEC) 75 MG TBDP Use one daily as needed for migraine headache   Topiramate ER (TROKENDI XR) 100 MG CP24 Take 100 mg by mouth daily.   traZODone (DESYREL) 100 MG tablet TAKE 2 AND 1/2 TABLETS BY MOUTH AT BEDTIME   zolpidem (AMBIEN) 5 MG tablet TAKE 1 TABLET(5 MG) BY MOUTH AT BEDTIME AS NEEDED FOR SLEEP   No facility-administered encounter medications on file as of 01/15/2022.    Allergies (verified) Sulfa antibiotics and Statins   History: Past Medical History:  Diagnosis Date   Allergy     Cancer ([place])    thyroid cancer   Cataract    Chronic migraine BOTOX INJECTION EVERY 3 MONTHS   Chronic pain in right shoulder    Constipation    Diabetes mellitus    Disorder of inner ear CAUSES VERTIGO OCCASIONALLY   Fibromyalgia    GERD (gastroesophageal reflux disease)    Hemorrhoid    History of bladder infections    History of thyroid cancer 2009  S/P TOTAL THYROIDECTOMY AND RADIATION   Hyperlipidemia    Hypertension CARDIOLOGIST- DR Wynonia Lawman- WILL REQUEST LATE NOTE   DENIES S & S   Hypothyroidism    Insomnia    Left shoulder pain    Macular degeneration    MRSA infection    nasal treated more than 15 years ago   Normal nuclear stress test 01-25-2009   OA (osteoarthritis) JOINTS   Osteopenia    Painful orthopaedic hardware ([place])    left foot   PONV (postoperative nausea and vomiting) SEVERE   Rotator cuff disorder LEFT SHOULDER RTC IMPINGMENT   Sleep apnea    mouth guard, no cpap    Sleep apnea in adult 06/26/2017   Spondylolisthesis of lumbar region    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   Wears glasses    Past Surgical History:  Procedure Laterality Date   BACK SURGERY     Dr Arnoldo Morale- spondylosis   BILATERAL CARPAL TUNNEL RELEASE  1994   BILATERAL ELBOW SURG.  [place] 12 DAYS AFTER    CATARACT EXTRACTION, BILATERAL     COLONOSCOPY     EYE SURGERY     FOOT ARTHRODESIS Left 07/10/2016   Procedure: LEFT 2ND TARSAL METATARSAL ARTHRODESIS  GASTROC RECESSION LEFT LAPIDUS MODIFIED MCBRIDE BUIONECTOMY;  Surgeon: Wylene Simmer, MD;  Location: [place];  Service: Orthopedics;  Laterality: Left;   GASTROC RECESSION EXTREMITY Left 07/10/2016   Procedure: GASTROC RECESSION EXTREMITY;  Surgeon: Wylene Simmer, MD;  Location: [facility];  Service: Orthopedics;  Laterality: Left;   HARDWARE REMOVAL Left 05/20/2018   Procedure: Left foot removal of deep implants;  Surgeon: Wylene Simmer, MD;   Location: [facility];  Service: Orthopedics;  Laterality: Left;  25min   LEFT THUMB JOINT REPLACEMENT  2005   RIGHT DONE IN 2004   OOPHORECTOMY     POLYPECTOMY     RIGHT KNEE ARTHROSCOPY  X2  BEFORE 2011   RIGHT KNEE ARTHROSCOPY/ PARTIAL LATERAL MENISECTOMY/ TRICOMPARTMENT CHONDROPLASTY/ DECOMPRESSION CYST  10-26-2009   RIGHT KNEE CLOSED MANIPULATION  09-11-2010   RIGHT SHOULDER ARTHROSCOPY  2010  &  2004   TIBIA CYST REMOVED AND ORIF LEG [place]   TOTAL  KNEE ARTHROPLASTY  07-16-2010   RIGHT   TOTAL THYROIDECTOMY  2009   CANCER  (POST-OP BLEED)  AND RADIATION [place]   trigger fingers Right 2013   3rd and 4th fingers   UPPER GASTROINTESTINAL ENDOSCOPY     VAGINAL HYSTERECTOMY  1990   Family History  Problem Relation Age of Onset   Colon polyps Sister 21   Alzheimer's disease Mother    Heart disease Father    Cirrhosis Father    Heart attack Father    Macular degeneration Father    Colon cancer Neg Hx    Social History   Socioeconomic History   Marital status: Married    Spouse name: Not on file   Number of children: Not on file   Years of education: Not on file   Highest education level: Not on file  Occupational History    Comment: retired Therapist, sports  Tobacco Use   Smoking status: Never   Smokeless tobacco: Never  Vaping Use   Vaping Use: Never used  Substance and Sexual Activity   Alcohol use: Yes    Comment: RARE   Drug use: No   Sexual activity: Not on file  Other Topics Concern   Not on file  Social History Narrative   Lives with husband Dr Ruben Reason.   They have 2 adult children that live out of state.   Social Determinants of Health   Financial Resource Strain: Not on file  Food Insecurity: Not on file  Transportation Needs: Not on file  Physical Activity: Not on file  Stress: Not on file  Social Connections: Not on file    Tobacco Counseling Counseling given: Not Answered   Clinical Intake:  Pre-visit  preparation completed: Yes  Pain : 0-10 Pain Score: 5  Pain Type: Chronic pain Pain Location: Arm Pain Orientation: Right, Upper Pain Descriptors / Indicators: Aching, Stabbing Pain Onset: More than a month ago Pain Frequency: Constant     BMI - recorded: 26.61 Nutritional Status: BMI 25 -29 Overweight Nutritional Risks: None Diabetes: Yes CBG done?: No Did pt. bring in CBG monitor from home?: No  How often do you need to have someone help you when you read instructions, pamphlets, or other written materials from your doctor or pharmacy?: 1 - Never  Diabetic?Yes Nutrition Risk Assessment:  Has the patient had any N/V/D within the last 2 months?  No  Does the patient have any non-healing wounds?  No  Has the patient had any unintentional weight loss or weight gain?  No   Diabetes:  Is the patient diabetic?  Yes  If diabetic, was a CBG obtained today?  No  Did the patient bring in their glucometer from home?  No  How often do you monitor your CBG's? Twice a week.   Financial Strains and Diabetes Management:  Are you having any financial strains with the device, your supplies or your medication? No .  Does the patient want to be seen by Chronic Care Management for management of their diabetes?  No  Would the patient like to be referred to a Nutritionist or for Diabetic Management?  No   Diabetic Exams:  Diabetic Eye Exam: Overdue for diabetic eye exam. Pt has been advised about the importance in completing this exam. Patient advised to call and schedule an eye exam. Diabetic Foot Exam: Completed 11/14/21    Interpreter Needed?: No  Information entered by :: Lallie Strahm   Activities of Daily Living    01/15/2022  11:14 AM 05/14/2021    9:56 AM  In your present state of health, do you have any difficulty performing the following activities:  Hearing? 1 0  Vision? 0 0  Difficulty concentrating or making decisions? 0 0  Walking or climbing stairs? 0 0   Dressing or bathing? 0 0  Doing errands, shopping? 0 0  Preparing Food and eating ? N   Using the Toilet? N   In the past six months, have you accidently leaked urine? Y   Do you have problems with loss of bowel control? N   Managing your Medications? N   Managing your Finances? N   Housekeeping or managing your Housekeeping? N     Patient Care Team: Copland, Gwenlyn Found, MD as PCP - General (Family Medicine) Pyrtle, Carie Caddy, MD as Consulting Physician (Gastroenterology) Dorisann Frames, MD as Consulting Physician (Endocrinology) Ihor Gully, MD (Inactive) as Consulting Physician (Urology) Monica Becton, MD as Consulting Physician (Sports Medicine)  Indicate any recent Medical Services you may have received from other than [facility] providers in the past year (date may be approximate).     Assessment:   This is a routine wellness examination for [place].  Hearing/Vision screen No results found.  Dietary issues and exercise activities discussed: Current Exercise Habits: The patient does not participate in regular exercise at present, Exercise limited by: None identified   Goals Addressed             This Visit's Progress    Patient Stated   On track    Maintain healthy lifestyle       Depression Screen    01/15/2022   11:09 AM 11/14/2021   11:13 AM 02/02/2017    3:37 PM 03/05/2016   12:19 PM 01/16/2016   11:12 AM 10/22/2015    1:49 PM 08/10/2015   10:43 AM  PHQ 2/9 Scores  PHQ - 2 Score 0 0 0 0 0 0 0    Fall Risk    01/15/2022   11:08 AM 11/14/2021   11:13 AM 02/02/2017    3:37 PM 03/05/2016   12:19 PM 01/16/2016   11:12 AM  Fall Risk   Falls in the past year? 0 0 No No Yes  Number falls in past yr: 0 0   1  Injury with Fall? 0 0   No  Risk for fall due to : No Fall Risks      Follow up Falls evaluation completed        FALL RISK PREVENTION PERTAINING TO THE HOME:  Any stairs in or around the home? Yes  If so, are there any without handrails? No  Home  free of loose throw rugs in walkways, pet beds, electrical cords, etc? Yes  Adequate lighting in your home to reduce risk of falls? Yes   ASSISTIVE DEVICES UTILIZED TO PREVENT FALLS:  Life alert? No  Use of a cane, walker or w/c? No  Grab bars in the bathroom? No  Shower chair or bench in shower? Yes  Elevated toilet seat or a handicapped toilet? Yes   TIMED UP AND GO:  Was the test performed? No .    Cognitive Function:        01/15/2022   11:18 AM  6CIT Screen  What Year? 0 points  What month? 0 points  What time? 0 points  Count back from 20 0 points  Months in reverse 0 points  Repeat phrase 0 points  Total Score 0 points  Immunizations Immunization History  Administered Date(s) Administered   Fluad Quad(high Dose 65+) 05/02/2020   Influenza Split 04/27/2012   Influenza, High Dose Seasonal PF 05/07/2016, 04/27/2017   Influenza,inj,Quad PF,6+ Mos 05/25/2013, 04/19/2014, 05/01/2015   Influenza,inj,quad, With Preservative 03/28/2017   Influenza-Unspecified 04/11/2016, 03/28/2019   PFIZER(Purple Top)SARS-COV-2 Vaccination 09/03/2019, 09/28/2019, 07/23/2020   Pneumococcal Conjugate-13 10/31/2014   Pneumococcal Polysaccharide-23 05/01/2015   Tdap 03/10/2013   Zoster, Live 07/29/2011    TDAP status: Up to date  Flu Vaccine status: Up to date per pt  Pneumococcal vaccine status: Up to date  Covid-19 vaccine status: Completed vaccines per pt  Qualifies for Shingles Vaccine? Yes   Zostavax completed No   Shingrix Completed?: No.    Education has been provided regarding the importance of this vaccine. Patient has been advised to call insurance company to determine out of pocket expense if they have not yet received this vaccine. Advised may also receive vaccine at local pharmacy or Health Dept. Verbalized acceptance and understanding.  Screening Tests Health Maintenance  Topic Date Due   Zoster Vaccines- Shingrix (1 of 2) Never done   COVID-19 Vaccine (4 -  Pfizer series) 09/17/2020   OPHTHALMOLOGY EXAM  12/22/2020   INFLUENZA VACCINE  02/25/2022   HEMOGLOBIN A1C  05/16/2022   FOOT EXAM  11/15/2022   COLONOSCOPY (Pts 45-22yrs Insurance coverage will need to be confirmed)  12/05/2022   TETANUS/TDAP  03/11/2023   MAMMOGRAM  07/27/2023   Pneumonia Vaccine 94+ Years old  Completed   DEXA SCAN  Completed   Hepatitis C Screening  Completed   HPV VACCINES  Aged Out    Health Maintenance  Health Maintenance Due  Topic Date Due   Zoster Vaccines- Shingrix (1 of 2) Never done   COVID-19 Vaccine (4 - Pfizer series) 09/17/2020   OPHTHALMOLOGY EXAM  12/22/2020    Colorectal cancer screening: Type of screening: Colonoscopy. Completed 12/04/17. Repeat every 5 years  Mammogram status: Completed 07/26/21. Repeat every year  Bone Density status: Ordered declined. Pt provided with contact info and advised to call to schedule appt.  Lung Cancer Screening: (Low Dose CT Chest recommended if Age 43-80 years, 30 pack-year currently smoking OR have quit w/in 15years.) does not qualify.   Lung Cancer Screening Referral: N/A  Additional Screening:  Hepatitis C Screening: does qualify; Completed 03/05/16  Vision Screening: Recommended annual ophthalmology exams for early detection of glaucoma and other disorders of the eye. Is the patient up to date with their annual eye exam?  Yes  Who is the provider or what is the name of the office in which the patient attends annual eye exams? Dr. [place] If pt is not established with a provider, would they like to be referred to a provider to establish care? No .   Dental Screening: Recommended annual dental exams for proper oral hygiene  Community Resource Referral / Chronic Care Management: CRR required this visit?  No   CCM required this visit?  No      Plan:     I have personally reviewed and noted the following in the patient's chart:   Medical and social history Use of alcohol, tobacco or illicit  drugs  Current medications and supplements including opioid prescriptions. Patient is not currently taking opioid prescriptions. Functional ability and status Nutritional status Physical activity Advanced directives List of other physicians Hospitalizations, surgeries, and ER visits in previous 12 months Vitals Screenings to include cognitive, depression, and falls Referrals and appointments  In addition, I  have reviewed and discussed with patient certain preventive protocols, quality metrics, and best practice recommendations. A written personalized care plan for preventive services as well as general preventive health recommendations were provided to patient.   Due to this being a telephonic visit, the after visit summary with patients personalized plan was offered to patient via mail or my-chart.  Patient would like to access on my-chart.  Duard Brady Neeley Sedivy, [place]   01/15/2022   Nurse Notes: none

## 2022-01-15 NOTE — Patient Instructions (Signed)
Debbie Hayes , Thank you for taking time to come for your Medicare Wellness Visit. I appreciate your ongoing commitment to your health goals. Please review the following plan we discussed and let me know if I can assist you in the future.   Screening recommendations/referrals: Colonoscopy: 12/04/17 due 12/05/22 Mammogram: 07/26/21 due 07/26/22 Bone Density: declined Recommended yearly ophthalmology/optometry visit for glaucoma screening and checkup Recommended yearly dental visit for hygiene and checkup  Vaccinations: Influenza vaccine: up to date per pt Pneumococcal vaccine: up to date Tdap vaccine: up to date Shingles vaccine: up to date   Covid-19:completed per pt  Advanced directives: yes, not on file  Conditions/risks identified: see problem list   Next appointment: Follow up in one year for your annual wellness visit    Preventive Care 60 Years and Older, Female Preventive care refers to lifestyle choices and visits with your health care provider that can promote health and wellness. What does preventive care include? A yearly physical exam. This is also called an annual well check. Dental exams once or twice a year. Routine eye exams. Ask your health care provider how often you should have your eyes checked. Personal lifestyle choices, including: Daily care of your teeth and gums. Regular physical activity. Eating a healthy diet. Avoiding tobacco and drug use. Limiting alcohol use. Practicing safe sex. Taking low-dose aspirin every day. Taking vitamin and mineral supplements as recommended by your health care provider. What happens during an annual well check? The services and screenings done by your health care provider during your annual well check will depend on your age, overall health, lifestyle risk factors, and family history of disease. Counseling  Your health care provider may ask you questions about your: Alcohol use. Tobacco use. Drug use. Emotional  well-being. Home and relationship well-being. Sexual activity. Eating habits. History of falls. Memory and ability to understand (cognition). Work and work Statistician. Reproductive health. Screening  You may have the following tests or measurements: Height, weight, and BMI. Blood pressure. Lipid and cholesterol levels. These may be checked every 5 years, or more frequently if you are over 80 years old. Skin check. Lung cancer screening. You may have this screening every year starting at age 34 if you have a 30-pack-year history of smoking and currently smoke or have quit within the past 15 years. Fecal occult blood test (FOBT) of the stool. You may have this test every year starting at age 29. Flexible sigmoidoscopy or colonoscopy. You may have a sigmoidoscopy every 5 years or a colonoscopy every 10 years starting at age 73. Hepatitis C blood test. Hepatitis B blood test. Sexually transmitted disease (STD) testing. Diabetes screening. This is done by checking your blood sugar (glucose) after you have not eaten for a while (fasting). You may have this done every 1-3 years. Bone density scan. This is done to screen for osteoporosis. You may have this done starting at age 10. Mammogram. This may be done every 1-2 years. Talk to your health care provider about how often you should have regular mammograms. Talk with your health care provider about your test results, treatment options, and if necessary, the need for more tests. Vaccines  Your health care provider may recommend certain vaccines, such as: Influenza vaccine. This is recommended every year. Tetanus, diphtheria, and acellular pertussis (Tdap, Td) vaccine. You may need a Td booster every 10 years. Zoster vaccine. You may need this after age 20. Pneumococcal 13-valent conjugate (PCV13) vaccine. One dose is recommended after age 14. Pneumococcal  polysaccharide (PPSV23) vaccine. One dose is recommended after age 71. Talk to your  health care provider about which screenings and vaccines you need and how often you need them. This information is not intended to replace advice given to you by your health care provider. Make sure you discuss any questions you have with your health care provider. Document Released: 08/10/2015 Document Revised: 04/02/2016 Document Reviewed: 05/15/2015 Elsevier Interactive Patient Education  2017 [employer] Prevention in the Home Falls can cause injuries. They can happen to people of all ages. There are many things you can do to make your home safe and to help prevent falls. What can I do on the outside of my home? Regularly fix the edges of walkways and driveways and fix any cracks. Remove anything that might make you trip as you walk through a door, such as a raised step or threshold. Trim any bushes or trees on the path to your home. Use bright outdoor lighting. Clear any walking paths of anything that might make someone trip, such as rocks or tools. Regularly check to see if handrails are loose or broken. Make sure that both sides of any steps have handrails. Any raised decks and porches should have guardrails on the edges. Have any leaves, snow, or ice cleared regularly. Use sand or salt on walking paths during winter. Clean up any spills in your garage right away. This includes oil or grease spills. What can I do in the bathroom? Use night lights. Install grab bars by the toilet and in the tub and shower. Do not use towel bars as grab bars. Use non-skid mats or decals in the tub or shower. If you need to sit down in the shower, use a plastic, non-slip stool. Keep the floor dry. Clean up any water that spills on the floor as soon as it happens. Remove soap buildup in the tub or shower regularly. Attach bath mats securely with double-sided non-slip rug tape. Do not have throw rugs and other things on the floor that can make you trip. What can I do in the bedroom? Use night  lights. Make sure that you have a light by your bed that is easy to reach. Do not use any sheets or blankets that are too big for your bed. They should not hang down onto the floor. Have a firm chair that has side arms. You can use this for support while you get dressed. Do not have throw rugs and other things on the floor that can make you trip. What can I do in the kitchen? Clean up any spills right away. Avoid walking on wet floors. Keep items that you use a lot in easy-to-reach places. If you need to reach something above you, use a strong step stool that has a grab bar. Keep electrical cords out of the way. Do not use floor polish or wax that makes floors slippery. If you must use wax, use non-skid floor wax. Do not have throw rugs and other things on the floor that can make you trip. What can I do with my stairs? Do not leave any items on the stairs. Make sure that there are handrails on both sides of the stairs and use them. Fix handrails that are broken or loose. Make sure that handrails are as long as the stairways. Check any carpeting to make sure that it is firmly attached to the stairs. Fix any carpet that is loose or worn. Avoid having throw rugs at the top  or bottom of the stairs. If you do have throw rugs, attach them to the floor with carpet tape. Make sure that you have a light switch at the top of the stairs and the bottom of the stairs. If you do not have them, ask someone to add them for you. What else can I do to help prevent falls? Wear shoes that: Do not have high heels. Have rubber bottoms. Are comfortable and fit you well. Are closed at the toe. Do not wear sandals. If you use a stepladder: Make sure that it is fully opened. Do not climb a closed stepladder. Make sure that both sides of the stepladder are locked into place. Ask someone to hold it for you, if possible. Clearly mark and make sure that you can see: Any grab bars or handrails. First and last  steps. Where the edge of each step is. Use tools that help you move around (mobility aids) if they are needed. These include: Canes. Walkers. Scooters. Crutches. Turn on the lights when you go into a dark area. Replace any light bulbs as soon as they burn out. Set up your furniture so you have a clear path. Avoid moving your furniture around. If any of your floors are uneven, fix them. If there are any pets around you, be aware of where they are. Review your medicines with your doctor. Some medicines can make you feel dizzy. This can increase your chance of falling. Ask your doctor what other things that you can do to help prevent falls. This information is not intended to replace advice given to you by your health care provider. Make sure you discuss any questions you have with your health care provider. Document Released: 05/10/2009 Document Revised: 12/20/2015 Document Reviewed: 08/18/2014 Elsevier Interactive Patient Education  2017 [employer].

## 2022-01-20 ENCOUNTER — Other Ambulatory Visit: Payer: Self-pay | Admitting: Family Medicine

## 2022-01-20 DIAGNOSIS — E782 Mixed hyperlipidemia: Secondary | ICD-10-CM

## 2022-02-04 ENCOUNTER — Encounter: Payer: Self-pay | Admitting: Family Medicine

## 2022-02-06 ENCOUNTER — Other Ambulatory Visit: Payer: Self-pay

## 2022-02-06 ENCOUNTER — Other Ambulatory Visit: Payer: Medicare Other

## 2022-02-06 DIAGNOSIS — R300 Dysuria: Secondary | ICD-10-CM

## 2022-02-09 ENCOUNTER — Encounter: Payer: Self-pay | Admitting: Family Medicine

## 2022-02-09 LAB — URINE CULTURE
MICRO NUMBER:: 13642862
SPECIMEN QUALITY:: ADEQUATE

## 2022-02-10 MED ORDER — CIPROFLOXACIN HCL 250 MG PO TABS: 250.0000 mg | ORAL_TABLET | Freq: Two times a day (BID) | ORAL | 0 refills | Status: DC

## 2022-02-11 DIAGNOSIS — Z789 Other specified health status: Secondary | ICD-10-CM

## 2022-02-11 NOTE — Progress Notes (Signed)
[facility])                                            [facility] Team                                        Statin Quality Measure Assessment    02/11/2022  Debbie Hayes 03-30-1951 [id number]  Per review of chart and payor information, this patient has been flagged for non-adherence to the following CMS Quality Measure:   '[]'$  Statin Use in Persons with Diabetes  '[]'$  Statin Use in Persons with Cardiovascular Disease  The 10-year ASCVD risk score (Arnett DK, et al., 2019) is: 20%   Values used to calculate the score:     Age: 71 years     Sex: Female     Is Non-Hispanic African American: No     Diabetic: Yes     Tobacco smoker: No     Systolic Blood Pressure: 024 mmHg     Is BP treated: Yes     HDL Cholesterol: 60 mg/dL     Total Cholesterol: 148 mg/dL  Documented statin intolerance such as body aches per prior documentation. Repatha on file. Next appointment with PCP is on 02/17/2022. If deemed clinically appropriate, please consider associating an exclusion code (see options below) at the next office visit.  Please consider ONE of the following recommendations:   Initiate high intensity statin Atorvastatin '40mg'$  once daily, #90, 3 refills   Rosuvastatin '20mg'$  once daily, #90, 3 refills    Initiate moderate intensity          statin with reduced frequency if prior          statin intolerance 1x weekly, #13, 3 refills   2x weekly, #26, 3 refills   3x weekly, #39, 3 refills   Code for past statin intolerance or other exclusions (required annually)  Drug Induced Myopathy G72.0   Myositis, unspecified M60.9   Rhabdomyolysis M62.82   Cirrhosis of liver K74.69   Biliary cirrhosis, unspecified K74.5   Abnormal blood glucose - for SUPD ONLY R73.09   Thank you for your time,  Kristeen Miss, [place] Cell: [phone]

## 2022-02-12 NOTE — Progress Notes (Addendum)
[facility] [address] [place], [place] [address] [phone]  Date:  02/17/2022   Name:  Debbie Hayes   DOB:  1951-01-08   MRN:  [id number]  PCP:  Darreld Mclean, MD    Chief Complaint: lesion on scalp (Concerns/ questions: check ears, med refills)   History of Present Illness:  Debbie Hayes is a 71 y.o. very pleasant female patient who presents with the following:  Debbie Hayes is seen today for follow-up- history of migraine headache, sleep apnea, diabetes, hypothyroidism, hyperlipidemia Most recent visit with myself was in April  She is not currently taking a statin, has tried several and is unable to tolerate.  She is using Repatha Recently treated for UTI with Cipro  Shingrix-recommended COVID booster Eye exam- UTD  DEXA scan can be updated in September Mammograms up-to-date  She is currently using Bydureon Bcise for GLP-1, is interested in trying Ozempic instead for greater appetite control and weight loss benefit.  Because she is already on 2 mg BCise we will have her go straight to 1 mg of Ozempic- increase to 2 mg in a month or so as needed and tolerated  She uses oxycodone as needed for MSK pain- mostly uses when they are traveling,  notes that she may need more than 5 mg at times and may need to take 1.5 or 2.  She wonders about having the 10 mg tablets - can take a 1/2 for less severe pain Most recent rx for oxycodone 5 mg #60 in February   She also notes a scaly area on her scalp- thought was just dandruff but was a bit concerned as her daughter was recently dx with skin cancer.   Lab Results  Component Value Date   HGBA1C 7.2 (H) 11/14/2021   Levothyroxine 125 Exenatide ER 55m Tradjenta 5 mg Losartan 50 Toprol-XL 50 Singulair Trokendi ER 100 Nurtec as needed Trazodone at bedtime Repatha  Lab Results  Component Value Date   TSH 1.23 11/14/2021     Patient Active Problem  List   Diagnosis Date Noted   Acute cystitis without hematuria 05/14/2021   Medial epicondylitis, right 03/25/2021   Trigger thumb, right thumb 11/19/2020   Right wrist pain 10/23/2020   Osteoporosis 04/05/2020   Trigger finger, left middle finger 07/27/2019   Chronic neck pain 11/10/2018   Orthopedic hardware present 02/17/2018   Controlled diabetes mellitus type 2 with complications ([facility] 094/17/4081  Intractable chronic migraine without aura and with status migrainosus 12/15/2017   Arthralgia 12/15/2017   Joint pain 12/15/2017   Migraine, transformed 12/15/2017   Refractory migraine without aura 12/15/2017   Pain of left shoulder joint on movement 10/19/2017   OSA (obstructive sleep apnea) 06/26/2017   Snoring 03/25/2016   Palpitations 03/25/2016   Headache 03/25/2016   Sensory disorder of trigeminal nerve 11/26/2015   Spondylolisthesis of lumbar region 08/09/2014   Lumbar adjacent segment disease with spondylolisthesis 08/09/2014   Back pain 02/17/2014   Macular degeneration 02/17/2014   Backache 02/17/2014   Other and unspecified hyperlipidemia 10/14/2013   Hyperlipidemia 10/14/2013   Onychomycosis 12/01/2012   Arthritis 04/12/2012   Hypothyroidism 04/12/2012    Past Medical History:  Diagnosis Date   Allergy    Cancer ([facility]    thyroid cancer   Cataract    Chronic migraine BOTOX INJECTION EVERY 3 MONTHS   Chronic pain in right shoulder    Constipation  Diabetes mellitus    Disorder of inner ear CAUSES VERTIGO OCCASIONALLY   Fibromyalgia    GERD (gastroesophageal reflux disease)    Hemorrhoid    History of bladder infections    History of thyroid cancer 2009  S/P TOTAL THYROIDECTOMY AND RADIATION   Hyperlipidemia    Hypertension CARDIOLOGIST- DR Wynonia Lawman- WILL REQUEST LATE NOTE   DENIES S & S   Hypothyroidism    Insomnia    Left shoulder pain    Macular degeneration    MRSA infection    nasal treated more than 15 years ago   Normal nuclear stress test  01-25-2009   OA (osteoarthritis) JOINTS   Osteopenia    Painful orthopaedic hardware ([place])    left foot   PONV (postoperative nausea and vomiting) SEVERE   Rotator cuff disorder LEFT SHOULDER RTC IMPINGMENT   Sleep apnea    mouth guard, no cpap    Sleep apnea in adult 06/26/2017   Spondylolisthesis of lumbar region    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   Wears glasses     Past Surgical History:  Procedure Laterality Date   BACK SURGERY     Dr Arnoldo Morale- spondylosis   BILATERAL CARPAL TUNNEL RELEASE  1994   BILATERAL ELBOW SURG.  [place] 12 DAYS AFTER    CATARACT EXTRACTION, BILATERAL     COLONOSCOPY     EYE SURGERY     FOOT ARTHRODESIS Left 07/10/2016   Procedure: LEFT 2ND TARSAL METATARSAL ARTHRODESIS  GASTROC RECESSION LEFT LAPIDUS MODIFIED MCBRIDE BUIONECTOMY;  Surgeon: Wylene Simmer, MD;  Location: [facility];  Service: Orthopedics;  Laterality: Left;   GASTROC RECESSION EXTREMITY Left 07/10/2016   Procedure: GASTROC RECESSION EXTREMITY;  Surgeon: Wylene Simmer, MD;  Location: [facility];  Service: Orthopedics;  Laterality: Left;   HARDWARE REMOVAL Left 05/20/2018   Procedure: Left foot removal of deep implants;  Surgeon: Wylene Simmer, MD;  Location: [facility];  Service: Orthopedics;  Laterality: Left;  56mn   LEFT THUMB JOINT REPLACEMENT  2005   RIGHT DONE IN 2004   OOPHORECTOMY     POLYPECTOMY     RIGHT KNEE ARTHROSCOPY  X2  BEFORE 2011   RIGHT KNEE ARTHROSCOPY/ PARTIAL LATERAL MENISECTOMY/ TRICOMPARTMENT CHONDROPLASTY/ DECOMPRESSION CYST  10-26-2009   RIGHT KNEE CLOSED MANIPULATION  09-11-2010   RIGHT SHOULDER ARTHROSCOPY  2010  &  2004   TIBIA CYST REMOVED AND ORIF LEG F[place]pachet  TOTAL KNEE ARTHROPLASTY  07-16-2010   RIGHT   TOTAL THYROIDECTOMY  2009   CANCER  (POST-OP BLEED)  AND RADIATION [place]   trigger fingers Right 2013   3rd and 4th  fingers   UPPER GASTROINTESTINAL ENDOSCOPY     VAGINAL HYSTERECTOMY  1990    Social History   Tobacco Use   Smoking status: Never   Smokeless tobacco: Never  Vaping Use   Vaping Use: Never used  Substance Use Topics   Alcohol use: Yes    Comment: RARE   Drug use: No    Family History  Problem Relation Age of Onset   Colon polyps Sister 530  Alzheimer's disease Mother    Heart disease Father    Cirrhosis Father    Heart attack Father    Macular degeneration Father    Colon cancer Neg Hx     Allergies  Allergen Reactions  Sulfa Antibiotics Hives   Statins Other (See Comments)    Pt has tried multiple and cannot tolerate    Medication list has been reviewed and updated.  Current Outpatient Medications on File Prior to Visit  Medication Sig Dispense Refill   blood glucose meter kit and supplies KIT Test blood sugar once daily. Dx code: E11.9 1 each 0   Blood Glucose Monitoring Suppl (FREESTYLE LITE) DEVI Check blood sugar no more than twice daily 1 each 0   Exenatide ER 2 MG/0.85ML AUIJ INJECT 2 MG INTO THE SKIN ONCE A WEEK. 7.65 mL 12   fluconazole (DIFLUCAN) 150 MG tablet TAKE 1 TABLET BY MOUTH ONCE FOR 1 DOSE. CAN REPEAT WEEKLY AS NEEDED 5 tablet 0   glucose blood (FREESTYLE LITE) test strip CHECK BLOOD SUGARS NO MORE THAN TWICE DAILY 100 each 12   Lancets (FREESTYLE) lancets Check blood sugars no more than twice daily 100 each 12   levothyroxine (SYNTHROID) 125 MCG tablet Take 1 tablet (125 mcg total) by mouth daily before breakfast. 90 tablet 3   linagliptin (TRADJENTA) 5 MG TABS tablet TAKE 1 TABLET BY MOUTH DAILY. 90 tablet 3   losartan (COZAAR) 50 MG tablet TAKE 1 TABLET BY MOUTH DAILY 90 tablet 1   Magnesium 400 MG CAPS Take by mouth.     metoprolol succinate (TOPROL-XL) 50 MG 24 hr tablet TAKE 1 TABLET BY MOUTH DAILY WITH OR IMMEDIATELY FOLLOWING A MEAL 90 tablet 1   montelukast (SINGULAIR) 10 MG tablet TAKE 1 TABLET BY MOUTH AT BEDTIME 90 tablet 3    nitrofurantoin (MACRODANTIN) 50 MG capsule TAKE 1 CAPSULE BY MOUTH AT BEDTIME 90 capsule 3   ondansetron (ZOFRAN) 4 MG tablet Take 1 tablet (4 mg total) by mouth every 8 (eight) hours as needed for nausea or vomiting. 30 tablet 1   phenazopyridine (PYRIDIUM) 200 MG tablet Take 1 tablet (200 mg total) by mouth 3 (three) times daily as needed for pain. For UTI 30 tablet 1   REPATHA SURECLICK 284 MG/ML SOAJ INJECT 140MG INTO THE SKIN EVERY 14 DAYS 6 mL 1   Riboflavin (VITAMIN B2 PO) Take 400 mg by mouth.     Rimegepant Sulfate (NURTEC) 75 MG TBDP Use one daily as needed for migraine headache 16 tablet 5   Topiramate ER (TROKENDI XR) 100 MG CP24 Take 100 mg by mouth daily. 90 capsule 3   traZODone (DESYREL) 100 MG tablet TAKE 2 AND 1/2 TABLETS BY MOUTH AT BEDTIME 225 tablet 1   zolpidem (AMBIEN) 5 MG tablet TAKE 1 TABLET(5 MG) BY MOUTH AT BEDTIME AS NEEDED FOR SLEEP 90 tablet 1   No current facility-administered medications on file prior to visit.    Review of Systems:  As per HPI- otherwise negative.   Physical Examination: Vitals:   02/17/22 1334  BP: 110/60  Pulse: 95  Resp: 18  Temp: 97.8 F (36.6 C)   Vitals:   02/17/22 1334  Weight: 176 lb 6.4 oz (80 kg)  Height: 5' 8" (1.727 m)   Body mass index is 26.82 kg/m. Ideal Body Weight: Weight in (lb) to have BMI = 25: 164.1  GEN: no acute distress.  Mild overweight, looks well  HEENT: Atraumatic, Normocephalic.  Ears and Nose: No external deformity. CV: RRR, No M/G/R. No JVD. No thrill. No extra heart sounds. PULM: CTA B, no wheezes, crackles, rhonchi. No retractions. No resp. distress. No accessory muscle use. ABD: S, NT, ND, +BS. No rebound. No HSM. EXTR: No c/c/e  PSYCH: Normally interactive. Conversant.  Scaly areas on scalp  Assessment and Plan: Drug-induced myopathy  Diabetes mellitus type 2 with complications (HCC) - Plan: Semaglutide, 1 MG/DOSE, 4 MG/3ML SOPN  Mixed hyperlipidemia  Migraine without status  migrainosus, not intractable, unspecified migraine type  OSA (obstructive sleep apnea)  Acquired hypothyroidism  Chronic joint pain - Plan: DRUG MONITORING, PANEL 8 WITH CONFIRMATION, URINE, oxyCODONE (OXY IR/ROXICODONE) 5 MG immediate release tablet, DISCONTINUED: Oxycodone HCl 10 MG TABS  Estrogen deficiency - Plan: DG Bone Density  Nausea - Plan: ondansetron (ZOFRAN) 8 MG tablet  Scalp irritation - Plan: Fluocinolone Acetonide Body (DERMA-SMOOTHE/FS BODY) 0.01 % OIL, Ambulatory referral to Dermatology  Will start on ozempic instead of Bydureon- she will let me know how this works for her  Dermatology referral made and rx flocinolone scalp oil Refilled her prn oxycodone rx, gave 5 mg but can take up to 10 mg if needed for more severe pain   Signed Lamar Blinks, MD

## 2022-02-12 NOTE — Patient Instructions (Addendum)
It was good to see you again as always!  Assuming all is well please see me in about 6 months If you have not done Shingrix yet please consider it Required UDS today Let me know how you do with Ozempic and if you are able to get this paid for- we can go to '2mg'$  in a month or so if needed  Dermatology referral made to  Darrick Meigs, M.D

## 2022-02-17 ENCOUNTER — Encounter: Payer: Self-pay | Admitting: Family Medicine

## 2022-02-17 ENCOUNTER — Ambulatory Visit (INDEPENDENT_AMBULATORY_CARE_PROVIDER_SITE_OTHER): Payer: Medicare Other | Admitting: Family Medicine

## 2022-02-17 ENCOUNTER — Other Ambulatory Visit: Payer: Self-pay | Admitting: Family Medicine

## 2022-02-17 VITALS — BP 110/60 | HR 95 | Temp 97.8°F | Resp 18 | Ht 68.0 in | Wt 176.4 lb

## 2022-02-17 DIAGNOSIS — E118 Type 2 diabetes mellitus with unspecified complications: Secondary | ICD-10-CM | POA: Diagnosis not present

## 2022-02-17 DIAGNOSIS — G720 Drug-induced myopathy: Secondary | ICD-10-CM

## 2022-02-17 DIAGNOSIS — E039 Hypothyroidism, unspecified: Secondary | ICD-10-CM

## 2022-02-17 DIAGNOSIS — G43909 Migraine, unspecified, not intractable, without status migrainosus: Secondary | ICD-10-CM | POA: Diagnosis not present

## 2022-02-17 DIAGNOSIS — M2550 Pain in unspecified joint: Secondary | ICD-10-CM

## 2022-02-17 DIAGNOSIS — E782 Mixed hyperlipidemia: Secondary | ICD-10-CM

## 2022-02-17 DIAGNOSIS — G4733 Obstructive sleep apnea (adult) (pediatric): Secondary | ICD-10-CM

## 2022-02-17 DIAGNOSIS — G8929 Other chronic pain: Secondary | ICD-10-CM

## 2022-02-17 DIAGNOSIS — R110 Nausea: Secondary | ICD-10-CM

## 2022-02-17 DIAGNOSIS — E2839 Other primary ovarian failure: Secondary | ICD-10-CM

## 2022-02-17 DIAGNOSIS — R238 Other skin changes: Secondary | ICD-10-CM

## 2022-02-17 MED ORDER — OXYCODONE HCL 10 MG PO TABS: 5.0000 mg | ORAL_TABLET | Freq: Three times a day (TID) | ORAL | 0 refills | Status: DC | PRN

## 2022-02-17 MED ORDER — FLUOCINOLONE ACETONIDE BODY 0.01 % EX OIL: TOPICAL_OIL | CUTANEOUS | 2 refills | Status: DC

## 2022-02-17 MED ORDER — FLUOCINOLONE ACETONIDE SCALP 0.01 % EX OIL
TOPICAL_OIL | CUTANEOUS | 2 refills | Status: DC
Start: 2022-02-17 — End: 2022-08-07

## 2022-02-17 MED ORDER — ONDANSETRON HCL 8 MG PO TABS: 8.0000 mg | ORAL_TABLET | Freq: Three times a day (TID) | ORAL | 1 refills | Status: DC | PRN

## 2022-02-17 MED ORDER — SEMAGLUTIDE (1 MG/DOSE) 4 MG/3ML ~~LOC~~ SOPN: 1.0000 mg | PEN_INJECTOR | SUBCUTANEOUS | 2 refills | Status: DC

## 2022-02-17 MED ORDER — OXYCODONE HCL 5 MG PO TABS: 5.0000 mg | ORAL_TABLET | Freq: Three times a day (TID) | ORAL | 0 refills | Status: DC | PRN

## 2022-02-17 NOTE — Addendum Note (Signed)
Addended by: Lamar Blinks C on: 02/17/2022 09:28 PM   Modules accepted: Orders

## 2022-02-17 NOTE — Addendum Note (Signed)
Addended by: Lamar Blinks C on: 02/17/2022 07:59 PM   Modules accepted: Orders

## 2022-02-18 ENCOUNTER — Telehealth: Payer: Self-pay

## 2022-02-18 LAB — DRUG MONITORING, PANEL 8 WITH CONFIRMATION, URINE
6 Acetylmorphine: NEGATIVE ng/mL (ref <10)
Alcohol Metabolites: NEGATIVE ng/mL (ref <500)
Amphetamines: NEGATIVE ng/mL (ref <500)
Benzodiazepines: NEGATIVE ng/mL (ref <100)
Buprenorphine, Urine: NEGATIVE ng/mL (ref <5)
Cocaine Metabolite: NEGATIVE ng/mL (ref <150)
Creatinine: 56.6 mg/dL (ref >=20.0)
MDMA: NEGATIVE ng/mL (ref <500)
Marijuana Metabolite: NEGATIVE ng/mL (ref <20)
Opiates: NEGATIVE ng/mL (ref <100)
Oxidant: NEGATIVE ug/mL (ref <200)
Oxycodone: NEGATIVE ng/mL (ref <100)
pH: 5.3 (ref 4.5–9.0)

## 2022-02-18 LAB — DM TEMPLATE

## 2022-02-18 NOTE — Telephone Encounter (Signed)
Initiated:   Debbie Hayes (Key: [id number]) Ozempic (1 MG/DOSE) '4MG'$ /3ML pen-injectors   Form [employer] [place] Medicare Part D Electronic Request Form (CB)

## 2022-02-18 NOTE — Telephone Encounter (Signed)
Effective from 02/18/2022 through 02/19/2023.

## 2022-02-19 ENCOUNTER — Encounter: Payer: Self-pay | Admitting: Family Medicine

## 2022-02-20 ENCOUNTER — Ambulatory Visit: Payer: Medicare Other | Admitting: Family Medicine

## 2022-03-04 ENCOUNTER — Telehealth: Payer: Self-pay | Admitting: Family Medicine

## 2022-03-04 MED ORDER — CIPROFLOXACIN HCL 500 MG PO TABS: 500.0000 mg | ORAL_TABLET | Freq: Two times a day (BID) | ORAL | 0 refills | Status: AC

## 2022-03-04 NOTE — Telephone Encounter (Signed)
Received a message from patient, she has noted diarrhea and thinks it might be infectious.  She would be interested in trying an antibiotic to see if it would clear up  Sent in an rx for cipro Asked her to alert me if blood in stool, fever, of if not better soon JC

## 2022-03-10 ENCOUNTER — Other Ambulatory Visit: Payer: Self-pay | Admitting: Family Medicine

## 2022-04-01 ENCOUNTER — Telehealth: Payer: Self-pay

## 2022-04-01 NOTE — Telephone Encounter (Signed)
Message from Plan Effective from 04/01/2022 through 04/02/2023.

## 2022-04-01 NOTE — Telephone Encounter (Signed)
Initiated:  Debbie Hayes (Key: [id number]) Repatha SureClick '140MG'$ /ML auto-injectors   Form [employer] [place] Medicare Part D Electronic Request Form (CB)

## 2022-04-02 LAB — HM DIABETES EYE EXAM

## 2022-04-08 ENCOUNTER — Other Ambulatory Visit (INDEPENDENT_AMBULATORY_CARE_PROVIDER_SITE_OTHER): Payer: Medicare Other

## 2022-04-08 ENCOUNTER — Other Ambulatory Visit: Payer: Self-pay

## 2022-04-08 DIAGNOSIS — R350 Frequency of micturition: Secondary | ICD-10-CM

## 2022-04-09 ENCOUNTER — Other Ambulatory Visit: Payer: Self-pay | Admitting: Family Medicine

## 2022-04-09 ENCOUNTER — Encounter: Payer: Self-pay | Admitting: Family Medicine

## 2022-04-09 LAB — URINALYSIS, ROUTINE W REFLEX MICROSCOPIC
Bilirubin Urine: NEGATIVE
Hgb urine dipstick: NEGATIVE
Ketones, ur: NEGATIVE
Leukocytes,Ua: NEGATIVE
Nitrite: POSITIVE — AB
RBC / HPF: NONE SEEN (ref 0–?)
Specific Gravity, Urine: 1.01 (ref 1.000–1.030)
Total Protein, Urine: NEGATIVE
Urine Glucose: >=1000 — AB
Urobilinogen, UA: 0.2 (ref 0.0–1.0)
pH: 5.5 (ref 5.0–8.0)

## 2022-04-09 MED ORDER — CEPHALEXIN 500 MG PO CAPS: 500.0000 mg | ORAL_CAPSULE | Freq: Two times a day (BID) | ORAL | 0 refills | Status: DC

## 2022-04-11 ENCOUNTER — Encounter: Payer: Self-pay | Admitting: Family Medicine

## 2022-04-11 LAB — URINE CULTURE
MICRO NUMBER:: 13905640
SPECIMEN QUALITY:: ADEQUATE

## 2022-04-11 NOTE — Progress Notes (Signed)
Pt is being treated with keflex 500 BID for one week  Results for orders placed or performed in visit on 04/08/22  Urine Culture   Specimen: Urine  Result Value Ref Range   MICRO NUMBER: [id number]    SPECIMEN QUALITY: Adequate    Sample Source URINE, CLEAN CATCH    STATUS: FINAL    ISOLATE 1: Klebsiella pneumoniae (A)       Susceptibility   Klebsiella pneumoniae - URINE CULTURE, REFLEX    AMOX/CLAVULANIC <=2 Sensitive     AMPICILLIN 16 Resistant     AMPICILLIN/SULBACTAM 4 Sensitive     CEFAZOLIN* <=4 Not Reportable      * For infections other than uncomplicated UTI caused by E. coli, K. pneumoniae or P. mirabilis: Cefazolin is resistant if MIC > or = 8 mcg/mL. (Distinguishing susceptible versus intermediate for isolates with MIC < or = 4 mcg/mL requires additional testing.) For uncomplicated UTI caused by E. coli, K. pneumoniae or P. mirabilis: Cefazolin is susceptible if MIC <32 mcg/mL and predicts susceptible to the oral agents cefaclor, cefdinir, cefpodoxime, cefprozil, cefuroxime, cephalexin and loracarbef.     CEFTAZIDIME <=1 Sensitive     CEFEPIME <=1 Sensitive     CEFTRIAXONE <=1 Sensitive     CIPROFLOXACIN <=0.25 Sensitive     LEVOFLOXACIN <=0.12 Sensitive     GENTAMICIN <=1 Sensitive     IMIPENEM <=0.25 Sensitive     NITROFURANTOIN 64 Intermediate     PIP/TAZO <=4 Sensitive     TOBRAMYCIN <=1 Sensitive     TRIMETH/SULFA* <=20 Sensitive      * For infections other than uncomplicated UTI caused by E. coli, K. pneumoniae or P. mirabilis: Cefazolin is resistant if MIC > or = 8 mcg/mL. (Distinguishing susceptible versus intermediate for isolates with MIC < or = 4 mcg/mL requires additional testing.) For uncomplicated UTI caused by E. coli, K. pneumoniae or P. mirabilis: Cefazolin is susceptible if MIC <32 mcg/mL and predicts susceptible to the oral agents cefaclor, cefdinir, cefpodoxime, cefprozil, cefuroxime, cephalexin and loracarbef. Legend: S = Susceptible   I = Intermediate R = Resistant  NS = Not susceptible * = Not tested  NR = Not reported **NN = See antimicrobic comments   Urinalysis, Routine w reflex microscopic  Result Value Ref Range   Color, Urine YELLOW Yellow;Lt. Yellow;Straw;Dark Yellow;Amber;Green;Red;Brown   APPearance Sl Cloudy (A) Clear;Turbid;Slightly Cloudy;Cloudy   Specific Gravity, Urine 1.010 1.000 - 1.030   pH 5.5 5.0 - 8.0   Total Protein, Urine NEGATIVE Negative   Urine Glucose >=1000 (A) Negative   Ketones, ur NEGATIVE Negative   Bilirubin Urine NEGATIVE Negative   Hgb urine dipstick NEGATIVE Negative   Urobilinogen, UA 0.2 0.0 - 1.0   Leukocytes,Ua NEGATIVE Negative   Nitrite POSITIVE (A) Negative   WBC, UA 7-10/hpf (A) 0-2/hpf   RBC / HPF none seen 0-2/hpf   Squamous Epithelial / LPF Rare(0-4/hpf) Rare(0-4/hpf)   Bacteria, UA Few(10-50/hpf) (A) None

## 2022-04-14 ENCOUNTER — Encounter: Payer: Self-pay | Admitting: Family Medicine

## 2022-04-16 ENCOUNTER — Encounter: Payer: Self-pay | Admitting: Family Medicine

## 2022-04-17 ENCOUNTER — Telehealth: Payer: Self-pay | Admitting: Family Medicine

## 2022-04-17 ENCOUNTER — Encounter: Payer: Self-pay | Admitting: Family Medicine

## 2022-04-17 DIAGNOSIS — U071 COVID-19: Secondary | ICD-10-CM

## 2022-04-17 MED ORDER — NIRMATRELVIR/RITONAVIR (PAXLOVID)TABLET: 3.0000 | ORAL_TABLET | Freq: Two times a day (BID) | ORAL | 0 refills | Status: AC

## 2022-04-17 NOTE — Telephone Encounter (Signed)
Pt + for covid 19 this am.  Sx are mild, she is vaccinated Would like paxlovid Rx sent to her [facility] Cautioned that paxlovid may increase effect of her oxycodone   Meds ordered this encounter  Medications   nirmatrelvir/ritonavir EUA (PAXLOVID) 20 x 150 MG & 10 x '100MG'$  TABS    Sig: Take 3 tablets by mouth 2 (two) times daily for 5 days. (Take nirmatrelvir 150 mg two tablets twice daily for 5 days and ritonavir 100 mg one tablet twice daily for 5 days) Patient GFR is 86    Dispense:  30 tablet    Refill:  0

## 2022-04-18 ENCOUNTER — Other Ambulatory Visit: Payer: Self-pay | Admitting: Family Medicine

## 2022-04-18 ENCOUNTER — Other Ambulatory Visit (INDEPENDENT_AMBULATORY_CARE_PROVIDER_SITE_OTHER): Payer: Medicare Other

## 2022-04-18 DIAGNOSIS — R300 Dysuria: Secondary | ICD-10-CM | POA: Diagnosis not present

## 2022-04-19 LAB — URINE CULTURE
MICRO NUMBER:: 13955953
Result:: NO GROWTH
SPECIMEN QUALITY:: ADEQUATE

## 2022-04-20 ENCOUNTER — Encounter: Payer: Self-pay | Admitting: Family Medicine

## 2022-04-25 ENCOUNTER — Ambulatory Visit: Payer: Medicare Other | Admitting: Sports Medicine

## 2022-05-03 ENCOUNTER — Other Ambulatory Visit: Payer: Self-pay | Admitting: Family Medicine

## 2022-05-05 ENCOUNTER — Ambulatory Visit (INDEPENDENT_AMBULATORY_CARE_PROVIDER_SITE_OTHER): Payer: Medicare Other | Admitting: Sports Medicine

## 2022-05-05 DIAGNOSIS — M7701 Medial epicondylitis, right elbow: Secondary | ICD-10-CM

## 2022-05-05 NOTE — Assessment & Plan Note (Signed)
This is a very pleasant 71 year old female, she has had a several month history of pain medial elbow, she has seen multiple providers including myself, we did therapy, injections, she saw Dr. Amedeo Plenty with hand surgery who did another injection, unfortunately after greater than 6 weeks of conservative treatment she has not improved. At this point she is interested in discussing PRP, I advised her I would like another 4 to 6 weeks of physical therapy, she has already had an x-ray with her surgeon that was unrevealing, I am going to also go ahead and order an MRI to confirm the diagnosis. If failure of 4 to 6 weeks of therapy we will do PRP percutaneous tenotomy.

## 2022-05-05 NOTE — Patient Instructions (Signed)
Platelet-rich plasma is used in musculoskeletal medicine to focus your own body's ability to heal. It has several well-done published randomized control trials (RCT) which demonstrate both its effectiveness and safety in many musculoskeletal conditions, including osteoarthritis, tendinopathies, and damaged vertebral discs. PRP has been in clinical use since the 1990's. Many people know that platelets form a clot if there is a cut in the skin. It turns out that platelets do not only form a clot, they also start the body's own repair process. When platelets activate to form a clot, they also release alpha granules which have hundreds of chemical messengers in them that initiate and organize repair to the damaged tissue. Precisely placing PRP into the site of injury will initiate the healing process by activating on the damaged cartilage or tendon. This is an inflammatory process, and inflammation is the vital first phase of Healing.  What to expect and how to prepare for PRP   2 weeks prior to the procedure: depending on the procedure, you may need to arrange for a driver to bring you home. IF you are having a lower extremity procedure, we can provide crutches as needed.   7 days prior to the procedure: Stop taking anti-inflammatory drugs like ibuprofen, Naprosyn, Celebrex, or Meloxicam. Let Dr. Dianah Field know if you have been taking prednisone or other corticosteroids in the last month.   The day before the procedure: thoroughly shower and clean your skin.    The day of the procedure: Wear loose-fitting clothing like sweatpants or shorts. If you are having an upper body procedure wear a top that can button or zip up.  PRP will initiate healing and a productive inflammation, and PRP therapy will make the body part treated sore for 4 days to two weeks. Anti-inflammatory drugs (i.e. ibuprofen, Naprosyn, Celebrex) and corticosteroids such as prednisone can blunt or stop this process,  so it is important to not take any anti-inflammatory drugs for 7 days before getting PRP therapy, or for at least three weeks after PRP therapy. Corticosteroid injections can blunt inflammation for 30 days, so let us know if you have had one recently. Depending on the body part injected, you may be in a sling or on crutches for several days. Just like wringing out a wet dishcloth, if you load or tense a tendon or ligament that has just been injected with PRP, some of the PRP injected will squish out. By keeping the body part treated relaxed by using a sling (for the shoulder or arm) or crutches (for hips and legs) for a few days, the PRP can bind in place and do its job.   You may need a driver to bring you home.  Tobacco/nicotine is a potent toxin and its use constricts small blood vessels which are needed for tissue repair.  Tobacco/nicotine use will limit the effectiveness of any treatment and stopping tobacco use is one of the single  greatest actions you can take to improve your health. Avoid toxins like alcohol, which inhibits and depresses the cells needed for tissue repair.  What happens during the PRP procedure?  Platelet rich plasma is made by taking some of your blood and performing a two-stage centrifuge process on it to concentrate the PRP. First, your blood is drawn into a syringe with a small amount of anti-coagulant in it (this is to keep the blood from clotting during this process). The amount of blood drawn is usually about 10-30 milliliters, depending on how much PRP is needed for  the treatment.  (There are 355 milliliters in a 12-ounce soda can for comparison).  Then the blood is transferred in a sterile fashion into a centrifuge tube. It is then centrifuged for the first cycle where the red blood cells are isolated and discarded. In the second centrifuge cycle, the platelet-rich fraction of the remaining plasma is concentrated and placed in a syringe. The skin at the  injection site is numbed with a small amount of topical cooling spray. Dr Parth Mccormac will then precisely inject the PRP into the injury site using ultrasound guidance.  What to do after your procedure  I will give you specific medicine to control any discomfort you may have after the procedure. Avoid NSAIDs like ibuprofen. Acetaminophen can be used for mild pain.  Depending on the part of the body treated, usually you will be placed in a sling or on crutches for 1 to 3 days. Do your best not to tense or load the treated area during this time. After 3 days, unless otherwise instructed, the treated body part should be used and slowly moved through its full range of motion. It will be sore, but you will not be doing damage by moving it, in fact it needs to move to heal. If you were on crutches for a period of time, walking is ok once you are off the crutches. For now, avoid activities that specifically hurt you before being treated. Exercise is vital to good health and finding a way to cross train around your injury is important not only for your physical health, but for your mental health as well. Ask me about cross training options for your injury. Some brief (10 minutes or less) period of heat or ice therapy will not hurt the therapy, but it is not required. Usually, depending on the initial injury, physical therapy is started from two weeks to four weeks after injection. Improvements in pain and function should be expected from 8 weeks to 12 weeks after injection and some injuries may require more than one treatment.    ____________________________________________ Justise Ehmann J. Schyler Counsell, M.D., ABFM., CAQSM., AME. Primary Care and Sports Medicine [facility]   Adjunct Professor of Family Medicine  [employer]  [employer] Aviation Medical [employer]er  

## 2022-05-05 NOTE — Progress Notes (Signed)
    Procedures performed today:    None.  Independent interpretation of notes and tests performed by another provider:   None.  Brief History, Exam, Impression, and Recommendations:    Medial epicondylitis, right This is a very pleasant 71 year old female, she has had a several month history of pain medial elbow, she has seen multiple providers including myself, we did therapy, injections, she saw Dr. Amedeo Plenty with hand surgery who did another injection, unfortunately after greater than 6 weeks of conservative treatment she has not improved. At this point she is interested in discussing PRP, I advised her I would like another 4 to 6 weeks of physical therapy, she has already had an x-ray with her surgeon that was unrevealing, I am going to also go ahead and order an MRI to confirm the diagnosis. If failure of 4 to 6 weeks of therapy we will do PRP percutaneous tenotomy.  I spent 30 minutes of total time managing this patient today, this includes chart review, face to face, and non-face to face time.  ____________________________________________ Gwen Her. Dianah Field, M.D., ABFM., CAQSM., AME. Primary Care and Sports Medicine [facility]  Adjunct Professor of [employer]  Risk manager

## 2022-05-07 ENCOUNTER — Encounter: Payer: Self-pay | Admitting: Physical Therapy

## 2022-05-07 ENCOUNTER — Ambulatory Visit: Payer: Medicare Other | Attending: Sports Medicine | Admitting: Physical Therapy

## 2022-05-07 DIAGNOSIS — M25521 Pain in right elbow: Secondary | ICD-10-CM | POA: Diagnosis not present

## 2022-05-07 DIAGNOSIS — M7701 Medial epicondylitis, right elbow: Secondary | ICD-10-CM | POA: Diagnosis not present

## 2022-05-07 NOTE — Therapy (Signed)
OUTPATIENT PHYSICAL THERAPY UPPER EXTREMITY EVALUATION   Patient Name: Debbie Hayes MRN: [id number] DOB:[birth year], 71 y.o., female Today's Date: 05/07/2022   PT End of Session - 05/07/22 1533     Visit Number 1    Date for PT Re-Evaluation 07/07/22    Authorization Type BCBS medicare    PT Start Time 1528    PT Stop Time 1610    PT Time Calculation (min) 42 min    Activity Tolerance Patient limited by pain    Behavior During Therapy WFL for tasks assessed/performed             Past Medical History:  Diagnosis Date   Allergy    Cancer ([place])    thyroid cancer   Cataract    Chronic migraine BOTOX INJECTION EVERY 3 MONTHS   Chronic pain in right shoulder    Constipation    Diabetes mellitus    Disorder of inner ear CAUSES VERTIGO OCCASIONALLY   Fibromyalgia    GERD (gastroesophageal reflux disease)    Hemorrhoid    History of bladder infections    History of thyroid cancer 2009  S/P TOTAL THYROIDECTOMY AND RADIATION   Hyperlipidemia    Hypertension CARDIOLOGIST- DR Wynonia Lawman- WILL REQUEST LATE NOTE   DENIES S & S   Hypothyroidism    Insomnia    Left shoulder pain    Macular degeneration    MRSA infection    nasal treated more than 15 years ago   Normal nuclear stress test 01-25-2009   OA (osteoarthritis) JOINTS   Osteopenia    Painful orthopaedic hardware ([place])    left foot   PONV (postoperative nausea and vomiting) SEVERE   Rotator cuff disorder LEFT SHOULDER RTC IMPINGMENT   Sleep apnea    mouth guard, no cpap    Sleep apnea in adult 06/26/2017   Spondylolisthesis of lumbar region    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   Wears glasses    Past Surgical History:  Procedure Laterality Date   BACK SURGERY     Dr Arnoldo Morale- spondylosis   BILATERAL CARPAL TUNNEL RELEASE  1994   BILATERAL ELBOW SURG.  [place] 12 DAYS AFTER    CATARACT EXTRACTION, BILATERAL     COLONOSCOPY     EYE SURGERY      FOOT ARTHRODESIS Left 07/10/2016   Procedure: LEFT 2ND TARSAL METATARSAL ARTHRODESIS  GASTROC RECESSION LEFT LAPIDUS MODIFIED MCBRIDE BUIONECTOMY;  Surgeon: Wylene Simmer, MD;  Location: [facility];  Service: Orthopedics;  Laterality: Left;   GASTROC RECESSION EXTREMITY Left 07/10/2016   Procedure: GASTROC RECESSION EXTREMITY;  Surgeon: Wylene Simmer, MD;  Location: [place] [facility];  Service: Orthopedics;  Laterality: Left;   HARDWARE REMOVAL Left 05/20/2018   Procedure: Left foot removal of deep implants;  Surgeon: Wylene Simmer, MD;  Location: [facility];  Service: Orthopedics;  Laterality: Left;  85mn   LEFT THUMB JOINT REPLACEMENT  2005   RIGHT DONE IN 2004   OOPHORECTOMY     POLYPECTOMY     RIGHT KNEE ARTHROSCOPY  X2  BEFORE 2011   RIGHT KNEE ARTHROSCOPY/ PARTIAL LATERAL MENISECTOMY/ TRICOMPARTMENT CHONDROPLASTY/ DECOMPRESSION CYST  10-26-2009   RIGHT KNEE CLOSED MANIPULATION  09-11-2010   RIGHT SHOULDER ARTHROSCOPY  2010  &  2004   TIBIA CYST REMOVED AND ORIF LEG [place]  TOTAL KNEE ARTHROPLASTY  07-16-2010  RIGHT   TOTAL THYROIDECTOMY  2009   CANCER  (POST-OP BLEED)  AND RADIATION [place]   trigger fingers Right 2013   3rd and 4th fingers   UPPER GASTROINTESTINAL ENDOSCOPY     VAGINAL HYSTERECTOMY  1990   Patient Active Problem List   Diagnosis Date Noted   Acute cystitis without hematuria 05/14/2021   Medial epicondylitis, right 03/25/2021   Trigger thumb, right thumb 11/19/2020   Right wrist pain 10/23/2020   Osteoporosis 04/05/2020   Trigger finger, left middle finger 07/27/2019   Chronic neck pain 11/10/2018   Orthopedic hardware present 02/17/2018   Controlled diabetes mellitus type 2 with complications ([place]) 38/75/6433   Intractable chronic migraine without aura and with status migrainosus 12/15/2017   Arthralgia 12/15/2017   Joint pain 12/15/2017   Migraine, transformed 12/15/2017   Refractory migraine  without aura 12/15/2017   Pain of left shoulder joint on movement 10/19/2017   OSA (obstructive sleep apnea) 06/26/2017   Snoring 03/25/2016   Palpitations 03/25/2016   Headache 03/25/2016   Sensory disorder of trigeminal nerve 11/26/2015   Spondylolisthesis of lumbar region 08/09/2014   Lumbar adjacent segment disease with spondylolisthesis 08/09/2014   Back pain 02/17/2014   Macular degeneration 02/17/2014   Backache 02/17/2014   Other and unspecified hyperlipidemia 10/14/2013   Hyperlipidemia 10/14/2013   Onychomycosis 12/01/2012   Arthritis 04/12/2012   Hypothyroidism 04/12/2012    PCP: [facility]  REFERRING PROVIDER: Thekkekandam  REFERRING DIAG: medial epicondylitis  THERAPY DIAG:  Pain in right elbow  Rationale for Evaluation and Treatment Rehabilitation  ONSET DATE: July 2023  SUBJECTIVE:                                                                                                                                                                                      SUBJECTIVE STATEMENT: Reports a hx of elbow pain, reports 1999 had bilateral lateral epicondyle releases.  Reports had great relief until about 2 years ago, she has been getting injections that helped at first but it is lasting less and less  PERTINENT HISTORY: Extensive as noted above  PAIN:  Are you having pain? Yes: NPRS scale: 3/10 Pain location: medial right elbow Pain description: ache Aggravating factors: touching, sewing, cooking, cleaning pain up to 7-8/10 Relieving factors: not using it, pain meds, at best pain a 3/10  PRECAUTIONS: Back  WEIGHT BEARING RESTRICTIONS No  FALLS:  Has patient fallen in last 6 months? No  LIVING ENVIRONMENT: Lives with: lives with their family Lives in: House/apartment Stairs: Yes: Internal: 12 steps; can reach both Has following equipment at home: None  OCCUPATION: Retired Marine scientist  PLOF: Independent and cooks, cleans and  sews  PATIENT GOALS less  pain  OBJECTIVE:   PATIENT SURVEYS:  FOTO 40  COGNITION:  Overall cognitive status: Within functional limits for tasks assessed     SENSATION: WFL  POSTURE: Fwd head, rounded shoulders  UPPER EXTREMITY ROM:   Active ROM Right eval Left eval  Shoulder flexion    Shoulder extension    Shoulder abduction    Shoulder adduction    Shoulder internal rotation    Shoulder external rotation    Elbow flexion 1   Elbow extension 120   Wrist flexion 50   Wrist extension 40   Wrist ulnar deviation    Wrist radial deviation    Wrist pronation    Wrist supination    (Blank rows = not tested)  UPPER EXTREMITY MMT:  MMT Right eval Left eval  Shoulder flexion    Shoulder extension    Shoulder abduction    Shoulder adduction    Shoulder internal rotation    Shoulder external rotation    Middle trapezius    Lower trapezius    Elbow flexion 4 pain   Elbow extension 4 pain   Wrist flexion 4 pain   Wrist extension 4 pain   Wrist ulnar deviation    Wrist radial deviation    Wrist pronation    Wrist supination    Grip strength (lbs)    (Blank rows = not tested)  PALPATION:  Medial epicondyle very tender   TODAY'S TREATMENT:  05/07/22 Ionto 30m dose dex right medial elbow   PATIENT EDUCATION: Education details: POC Person educated: Patient Education method: Explanation Education comprehension: verbalized understanding   HOME EXERCISE PROGRAM: TBD  ASSESSMENT:  CLINICAL IMPRESSION: Patient is a 71y.o. female who was seen today for physical therapy evaluation and treatment for right medial epicondylitis.  She had both elbows with a lateral release 24 years ago and no issues until about 2 years ago started having some elbow pain, She reports multiple injections, recently not helping, right is worse than the left.  Has limitation in ROM and strength and is very tender and painful    OBJECTIVE IMPAIRMENTS decreased activity tolerance, decreased ROM, decreased  strength, increased edema, increased muscle spasms, impaired flexibility, impaired UE functional use, improper body mechanics, and pain.   REHAB POTENTIAL: Good  CLINICAL DECISION MAKING: Stable/uncomplicated  EVALUATION COMPLEXITY: Low   GOALS: Goals reviewed with patient? Yes  SHORT TERM GOALS: Target date 05/21/22  Independent with initial HEP Goal status: INITIAL  LONG TERM GOALS: Target date: 07/30/22  Understand how to decrease stress on the elbow with posture and body mechanics Goal status: INITIAL  2.  Decrease pain overall 50% Goal status: INITIAL  3.  Increase elbow ROM to WFL's Goal status: INITIAL  4.  Increase wrist ROM to WFL's Goal status: INITIAL  5.  Report able to cook a meal without pain >3/10 Goal status: INITIAL   PLAN: PT FREQUENCY: 1-2x/week  PT DURATION: 12 weeks  PLANNED INTERVENTIONS: Therapeutic exercises, Therapeutic activity, Neuromuscular re-education, Balance training, Gait training, Patient/Family education, Self Care, Joint mobilization, Joint manipulation, Dry Needling, Electrical stimulation, Cryotherapy, Moist heat, Taping, and Manual therapy  PLAN FOR NEXT SESSION: [place] ionto, strenches   Keshun Berrett W, PT 05/07/2022, 3:34 PM

## 2022-05-09 ENCOUNTER — Other Ambulatory Visit: Payer: Medicare Other

## 2022-05-09 ENCOUNTER — Telehealth: Payer: Self-pay | Admitting: *Deleted

## 2022-05-09 DIAGNOSIS — R300 Dysuria: Secondary | ICD-10-CM

## 2022-05-09 MED ORDER — CEPHALEXIN 500 MG PO CAPS: 500.0000 mg | ORAL_CAPSULE | Freq: Two times a day (BID) | ORAL | 0 refills | Status: DC

## 2022-05-09 NOTE — Telephone Encounter (Signed)
Pt dropped off urine this morning for UTI symptoms. [facility] has a standing order for urine culture. Pt is asking for medication to be called in before culture results are back due to the severity of her symptoms. Symptoms started at 7am today, has frequent, burning and painful urination.  Would like Rx to be called to [facility] at [facility]  Please advise. Routing to PCP and DOD in PCP's absence.

## 2022-05-09 NOTE — Telephone Encounter (Signed)
Called in keflex

## 2022-05-11 ENCOUNTER — Encounter: Payer: Self-pay | Admitting: Family Medicine

## 2022-05-11 LAB — URINE CULTURE
MICRO NUMBER:: 14048184
SPECIMEN QUALITY:: ADEQUATE

## 2022-05-20 ENCOUNTER — Ambulatory Visit: Payer: Medicare Other | Admitting: Physical Therapy

## 2022-05-20 ENCOUNTER — Ambulatory Visit (INDEPENDENT_AMBULATORY_CARE_PROVIDER_SITE_OTHER): Payer: Medicare Other

## 2022-05-20 DIAGNOSIS — M25521 Pain in right elbow: Secondary | ICD-10-CM | POA: Diagnosis not present

## 2022-05-20 DIAGNOSIS — M7701 Medial epicondylitis, right elbow: Secondary | ICD-10-CM

## 2022-05-20 NOTE — Therapy (Signed)
OUTPATIENT PHYSICAL THERAPY UPPER EXTREMITY   Patient Name: Debbie Hayes MRN: [id number] DOB:[birth year], 71 y.o., female Today's Date: 05/20/2022   PT End of Session - 05/20/22 1229     Visit Number 2    Date for PT Re-Evaluation 07/07/22    PT Start Time 31    PT Stop Time 1315    PT Time Calculation (min) 45 min             Past Medical History:  Diagnosis Date   Allergy    Cancer ([place])    thyroid cancer   Cataract    Chronic migraine BOTOX INJECTION EVERY 3 MONTHS   Chronic pain in right shoulder    Constipation    Diabetes mellitus    Disorder of inner ear CAUSES VERTIGO OCCASIONALLY   Fibromyalgia    GERD (gastroesophageal reflux disease)    Hemorrhoid    History of bladder infections    History of thyroid cancer 2009  S/P TOTAL THYROIDECTOMY AND RADIATION   Hyperlipidemia    Hypertension CARDIOLOGIST- DR Wynonia Lawman- WILL REQUEST LATE NOTE   DENIES S & S   Hypothyroidism    Insomnia    Left shoulder pain    Macular degeneration    MRSA infection    nasal treated more than 15 years ago   Normal nuclear stress test 01-25-2009   OA (osteoarthritis) JOINTS   Osteopenia    Painful orthopaedic hardware ([place])    left foot   PONV (postoperative nausea and vomiting) SEVERE   Rotator cuff disorder LEFT SHOULDER RTC IMPINGMENT   Sleep apnea    mouth guard, no cpap    Sleep apnea in adult 06/26/2017   Spondylolisthesis of lumbar region    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   Wears glasses    Past Surgical History:  Procedure Laterality Date   BACK SURGERY     Dr Arnoldo Morale- spondylosis   BILATERAL CARPAL TUNNEL RELEASE  1994   BILATERAL ELBOW SURG.  [place] 12 DAYS AFTER    CATARACT EXTRACTION, BILATERAL     COLONOSCOPY     EYE SURGERY     FOOT ARTHRODESIS Left 07/10/2016   Procedure: LEFT 2ND TARSAL METATARSAL ARTHRODESIS  GASTROC RECESSION LEFT LAPIDUS MODIFIED MCBRIDE BUIONECTOMY;   Surgeon: Wylene Simmer, MD;  Location: [facility];  Service: Orthopedics;  Laterality: Left;   GASTROC RECESSION EXTREMITY Left 07/10/2016   Procedure: GASTROC RECESSION EXTREMITY;  Surgeon: Wylene Simmer, MD;  Location: [facility];  Service: Orthopedics;  Laterality: Left;   HARDWARE REMOVAL Left 05/20/2018   Procedure: Left foot removal of deep implants;  Surgeon: Wylene Simmer, MD;  Location: [facility];  Service: Orthopedics;  Laterality: Left;  44mn   LEFT THUMB JOINT REPLACEMENT  2005   RIGHT DONE IN 2004   OOPHORECTOMY     POLYPECTOMY     RIGHT KNEE ARTHROSCOPY  X2  BEFORE 2011   RIGHT KNEE ARTHROSCOPY/ PARTIAL LATERAL MENISECTOMY/ TRICOMPARTMENT CHONDROPLASTY/ DECOMPRESSION CYST  10-26-2009   RIGHT KNEE CLOSED MANIPULATION  09-11-2010   RIGHT SHOULDER ARTHROSCOPY  2010  &  2004   TIBIA CYST REMOVED AND ORIF LEG [place]  TOTAL KNEE ARTHROPLASTY  07-16-2010   RIGHT   TOTAL THYROIDECTOMY  2009   CANCER  (POST-OP BLEED)  AND RADIATION TX   trigger fingers Right 2013   3rd and  4th fingers   UPPER GASTROINTESTINAL ENDOSCOPY     VAGINAL HYSTERECTOMY  1990   Patient Active Problem List   Diagnosis Date Noted   Acute cystitis without hematuria 05/14/2021   Medial epicondylitis, right 03/25/2021   Trigger thumb, right thumb 11/19/2020   Right wrist pain 10/23/2020   Osteoporosis 04/05/2020   Trigger finger, left middle finger 07/27/2019   Chronic neck pain 11/10/2018   Orthopedic hardware present 02/17/2018   Controlled diabetes mellitus type 2 with complications ([place]) 69/67/8938   Intractable chronic migraine without aura and with status migrainosus 12/15/2017   Arthralgia 12/15/2017   Joint pain 12/15/2017   Migraine, transformed 12/15/2017   Refractory migraine without aura 12/15/2017   Pain of left shoulder joint on movement 10/19/2017   OSA (obstructive sleep apnea) 06/26/2017   Snoring 03/25/2016    Palpitations 03/25/2016   Headache 03/25/2016   Sensory disorder of trigeminal nerve 11/26/2015   Spondylolisthesis of lumbar region 08/09/2014   Lumbar adjacent segment disease with spondylolisthesis 08/09/2014   Back pain 02/17/2014   Macular degeneration 02/17/2014   Backache 02/17/2014   Other and unspecified hyperlipidemia 10/14/2013   Hyperlipidemia 10/14/2013   Onychomycosis 12/01/2012   Arthritis 04/12/2012   Hypothyroidism 04/12/2012    PCP: [facility]  REFERRING PROVIDER: Thekkekandam  REFERRING DIAG: medial epicondylitis  THERAPY DIAG:  Pain in right elbow  Rationale for Evaluation and Treatment Rehabilitation  ONSET DATE: July 2023  SUBJECTIVE:                                                                                                                                                                                      SUBJECTIVE STATEMENT: PRP after 1 month if PT does not help and MRiIscheduled today PERTINENT HISTORY: Extensive as noted above  PAIN:  Are you having pain? Yes: NPRS scale: 3/10 Pain location: medial right elbow Pain description: ache Aggravating factors: touching, sewing, cooking, cleaning pain up to 7-8/10 Relieving factors: not using it, pain meds, at best pain a 3/10  PRECAUTIONS: Back  WEIGHT BEARING RESTRICTIONS No  FALLS:  Has patient fallen in last 6 months? No  LIVING ENVIRONMENT: Lives with: lives with their family Lives in: House/apartment Stairs: Yes: Internal: 12 steps; can reach both Has following equipment at home: None  OCCUPATION: Retired Marine scientist  PLOF: Independent and cooks, cleans and sews  PATIENT GOALS less pain  OBJECTIVE:   PATIENT SURVEYS:  FOTO 40  COGNITION:  Overall cognitive status: Within functional limits for tasks assessed     SENSATION: WFL  POSTURE: Fwd head, rounded shoulders  UPPER EXTREMITY ROM:   Active ROM Right eval Left eval  Shoulder flexion  Shoulder extension     Shoulder abduction    Shoulder adduction    Shoulder internal rotation    Shoulder external rotation    Elbow flexion 1   Elbow extension 120   Wrist flexion 50   Wrist extension 40   Wrist ulnar deviation    Wrist radial deviation    Wrist pronation    Wrist supination    (Blank rows = not tested)  UPPER EXTREMITY MMT:  MMT Right eval Left eval  Shoulder flexion    Shoulder extension    Shoulder abduction    Shoulder adduction    Shoulder internal rotation    Shoulder external rotation    Middle trapezius    Lower trapezius    Elbow flexion 4 pain   Elbow extension 4 pain   Wrist flexion 4 pain   Wrist extension 4 pain   Wrist ulnar deviation    Wrist radial deviation    Wrist pronation    Wrist supination    Grip strength (lbs)    (Blank rows = not tested)  PALPATION:  Medial epicondyle very tender   TODAY'S TREATMENT:    05/20/22  [place] to Rt medial epicondyle 8 min  1.2 w/cm 2 STW to RT medila elbow- as toerated very tender Gentle stretching RT wrist, elbow and forarm muscles Estim with MH RT medial elbow IFC Ionto patch - charged today but unabel to give as she has MRI in 2 hours. Pt does not have another appt this week but will come in Thursday morning to get patch put on    05/07/22 Ionto 75m dose dex right medial elbow   PATIENT EDUCATION: Education details: POC Person educated: Patient Education method: Explanation Education comprehension: verbalized understanding   HOME EXERCISE PROGRAM: TBD  ASSESSMENT:  CLINICAL IMPRESSION: modalities, STW and stretching for pain control    OBJECTIVE IMPAIRMENTS decreased activity tolerance, decreased ROM, decreased strength, increased edema, increased muscle spasms, impaired flexibility, impaired UE functional use, improper body mechanics, and pain.   REHAB POTENTIAL: Good  CLINICAL DECISION MAKING: Stable/uncomplicated  EVALUATION COMPLEXITY: Low   GOALS: Goals reviewed with patient?  Yes  SHORT TERM GOALS: Target date 05/21/22  Independent with initial HEP Goal status: INITIAL  LONG TERM GOALS: Target date: 07/30/22  Understand how to decrease stress on the elbow with posture and body mechanics Goal status: INITIAL  2.  Decrease pain overall 50% Goal status: INITIAL  3.  Increase elbow ROM to WFL's Goal status: INITIAL  4.  Increase wrist ROM to WFL's Goal status: INITIAL  5.  Report able to cook a meal without pain >3/10 Goal status: INITIAL   PLAN: PT FREQUENCY: 1-2x/week  PT DURATION: 12 weeks  PLANNED INTERVENTIONS: Therapeutic exercises, Therapeutic activity, Neuromuscular re-education, Balance training, Gait training, Patient/Family education, Self Care, Joint mobilization, Joint manipulation, Dry Needling, Electrical stimulation, Cryotherapy, Moist heat, Taping, and Manual therapy  PLAN FOR NEXT SESSION: [place] ionto, strenches. Check goals   PLaqueta Carina PTA 05/20/2022, 12:30 PM [place] [address]Phone: [phone]  Fax:  [phone] Patient Details  Name: Debbie NEISWONGERMRN: [id number] of Birth: 111/27/1952Referring Provider:  CDarreld Mclean MD  Encounter Date: 05/20/2022   PLaqueta Carina PTA 05/20/2022, 12:30 PM  [place] [address]Phone: [phone]  Fax:  [phone]

## 2022-05-27 ENCOUNTER — Ambulatory Visit: Payer: Medicare Other | Admitting: Physical Therapy

## 2022-05-27 DIAGNOSIS — M25521 Pain in right elbow: Secondary | ICD-10-CM | POA: Diagnosis not present

## 2022-05-27 NOTE — Therapy (Signed)
OUTPATIENT PHYSICAL THERAPY UPPER EXTREMITY   Patient Name: Debbie Hayes MRN: [id number] DOB:19-Jul-1951, 71 y.o., female Today's Date: 05/27/2022   PT End of Session - 05/27/22 1511     Visit Number 3    Date for PT Re-Evaluation 07/07/22    Authorization Type BCBS medicare    PT Start Time 1445    PT Stop Time 8811    PT Time Calculation (min) 45 min             Past Medical History:  Diagnosis Date   Allergy    Cancer ([place])    thyroid cancer   Cataract    Chronic migraine BOTOX INJECTION EVERY 3 MONTHS   Chronic pain in right shoulder    Constipation    Diabetes mellitus    Disorder of inner ear CAUSES VERTIGO OCCASIONALLY   Fibromyalgia    GERD (gastroesophageal reflux disease)    Hemorrhoid    History of bladder infections    History of thyroid cancer 2009  S/P TOTAL THYROIDECTOMY AND RADIATION   Hyperlipidemia    Hypertension CARDIOLOGIST- DR Wynonia Lawman- WILL REQUEST LATE NOTE   DENIES S & S   Hypothyroidism    Insomnia    Left shoulder pain    Macular degeneration    MRSA infection    nasal treated more than 15 years ago   Normal nuclear stress test 01-25-2009   OA (osteoarthritis) JOINTS   Osteopenia    Painful orthopaedic hardware ([place])    left foot   PONV (postoperative nausea and vomiting) SEVERE   Rotator cuff disorder LEFT SHOULDER RTC IMPINGMENT   Sleep apnea    mouth guard, no cpap    Sleep apnea in adult 06/26/2017   Spondylolisthesis of lumbar region    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   Wears glasses    Past Surgical History:  Procedure Laterality Date   BACK SURGERY     Dr Arnoldo Morale- spondylosis   BILATERAL CARPAL TUNNEL RELEASE  1994   BILATERAL ELBOW SURG.  [place] 12 DAYS AFTER    CATARACT EXTRACTION, BILATERAL     COLONOSCOPY     EYE SURGERY     FOOT ARTHRODESIS Left 07/10/2016   Procedure: LEFT 2ND TARSAL METATARSAL ARTHRODESIS  GASTROC RECESSION LEFT [place]  MODIFIED MCBRIDE BUIONECTOMY;  Surgeon: Wylene Simmer, MD;  Location: [facility];  Service: Orthopedics;  Laterality: Left;   GASTROC RECESSION EXTREMITY Left 07/10/2016   Procedure: GASTROC RECESSION EXTREMITY;  Surgeon: Wylene Simmer, MD;  Location: [facility];  Service: Orthopedics;  Laterality: Left;   HARDWARE REMOVAL Left 05/20/2018   Procedure: Left foot removal of deep implants;  Surgeon: Wylene Simmer, MD;  Location: [facility];  Service: Orthopedics;  Laterality: Left;  81mn   LEFT THUMB JOINT REPLACEMENT  2005   RIGHT DONE IN 2004   OOPHORECTOMY     POLYPECTOMY     RIGHT KNEE ARTHROSCOPY  X2  BEFORE 2011   RIGHT KNEE ARTHROSCOPY/ PARTIAL LATERAL MENISECTOMY/ TRICOMPARTMENT CHONDROPLASTY/ DECOMPRESSION CYST  10-26-2009   RIGHT KNEE CLOSED MANIPULATION  09-11-2010   RIGHT SHOULDER ARTHROSCOPY  2010  &  2004   TIBIA CYST REMOVED AND ORIF LEG [place]  TOTAL KNEE ARTHROPLASTY  07-16-2010   RIGHT   TOTAL THYROIDECTOMY  2009   CANCER  (POST-OP BLEED)  AND RADIATION TX   trigger  fingers Right 2013   3rd and 4th fingers   UPPER GASTROINTESTINAL ENDOSCOPY     VAGINAL HYSTERECTOMY  1990   Patient Active Problem List   Diagnosis Date Noted   Acute cystitis without hematuria 05/14/2021   Medial epicondylitis, right 03/25/2021   Trigger thumb, right thumb 11/19/2020   Right wrist pain 10/23/2020   Osteoporosis 04/05/2020   Trigger finger, left middle finger 07/27/2019   Chronic neck pain 11/10/2018   Orthopedic hardware present 02/17/2018   Controlled diabetes mellitus type 2 with complications ([place]) 23/76/2831   Intractable chronic migraine without aura and with status migrainosus 12/15/2017   Arthralgia 12/15/2017   Joint pain 12/15/2017   Migraine, transformed 12/15/2017   Refractory migraine without aura 12/15/2017   Pain of left shoulder joint on movement 10/19/2017   OSA (obstructive sleep apnea) 06/26/2017    Snoring 03/25/2016   Palpitations 03/25/2016   Headache 03/25/2016   Sensory disorder of trigeminal nerve 11/26/2015   Spondylolisthesis of lumbar region 08/09/2014   Lumbar adjacent segment disease with spondylolisthesis 08/09/2014   Back pain 02/17/2014   Macular degeneration 02/17/2014   Backache 02/17/2014   Other and unspecified hyperlipidemia 10/14/2013   Hyperlipidemia 10/14/2013   Onychomycosis 12/01/2012   Arthritis 04/12/2012   Hypothyroidism 04/12/2012    PCP: [facility]  REFERRING PROVIDER: Thekkekandam  REFERRING DIAG: medial epicondylitis  THERAPY DIAG:  Pain in right elbow  Rationale for Evaluation and Treatment Rehabilitation  ONSET DATE: July 2023  SUBJECTIVE:                                                                                                                                                                                      SUBJECTIVE STATEMENT: MRI conformed diagnosis, feels good 24-36 hours after therapy no ast ing relief PERTINENT HISTORY: Extensive as noted above  PAIN:  Are you having pain? Yes: NPRS scale: 3/10 Pain location: medial right elbow Pain description: ache Aggravating factors: touching, sewing, cooking, cleaning pain up to 7-8/10 Relieving factors: not using it, pain meds, at best pain a 3/10  PRECAUTIONS: Back  WEIGHT BEARING RESTRICTIONS No  FALLS:  Has patient fallen in last 6 months? No  LIVING ENVIRONMENT: Lives with: lives with their family Lives in: House/apartment Stairs: Yes: Internal: 12 steps; can reach both Has following equipment at home: None  OCCUPATION: Retired Marine scientist  PLOF: Independent and cooks, cleans and sews  PATIENT GOALS less pain  OBJECTIVE:   PATIENT SURVEYS:  FOTO 40  COGNITION:  Overall cognitive status: Within functional limits for tasks assessed     SENSATION: WFL  POSTURE: Fwd head, rounded shoulders  UPPER EXTREMITY ROM:   Active ROM  Right eval Left eval   Shoulder flexion    Shoulder extension    Shoulder abduction    Shoulder adduction    Shoulder internal rotation    Shoulder external rotation    Elbow flexion 1   Elbow extension 120   Wrist flexion 50   Wrist extension 40   Wrist ulnar deviation    Wrist radial deviation    Wrist pronation    Wrist supination    (Blank rows = not tested)  UPPER EXTREMITY MMT:  MMT Right eval Left eval  Shoulder flexion    Shoulder extension    Shoulder abduction    Shoulder adduction    Shoulder internal rotation    Shoulder external rotation    Middle trapezius    Lower trapezius    Elbow flexion 4 pain   Elbow extension 4 pain   Wrist flexion 4 pain   Wrist extension 4 pain   Wrist ulnar deviation    Wrist radial deviation    Wrist pronation    Wrist supination    Grip strength (lbs)    (Blank rows = not tested)  PALPATION:  Medial epicondyle very tender   TODAY'S TREATMENT:   05/27/22  [place] to Rt medial epicondyle 8 min  1.2 w/cm 2 STW to RT medila elbow- as toerated very tender Gentle stretching RT wrist, elbow and forarm muscles Estim with MH RT medial elbow IFC Ionto patch - #3   05/20/22  [place] to Rt medial epicondyle 8 min  1.2 w/cm 2 STW to RT medila elbow- as toerated very tender Gentle stretching RT wrist, elbow and forarm muscles Estim with MH RT medial elbow IFC Ionto patch - charged today but unabel to give as she has MRI in 2 hours. Pt does not have another appt this week but will come in Thursday morning to get patch put on    05/07/22 Ionto 46m dose dex right medial elbow   PATIENT EDUCATION: Education details: POC Person educated: Patient Education method: Explanation Education comprehension: verbalized understanding   HOME EXERCISE PROGRAM: TBD  ASSESSMENT:  CLINICAL IMPRESSION: modalities, STW and stretching for pain control. Less tenderness noted with STW.    OBJECTIVE IMPAIRMENTS decreased activity tolerance, decreased ROM,  decreased strength, increased edema, increased muscle spasms, impaired flexibility, impaired UE functional use, improper body mechanics, and pain.   REHAB POTENTIAL: Good  CLINICAL DECISION MAKING: Stable/uncomplicated  EVALUATION COMPLEXITY: Low   GOALS: Goals reviewed with patient? Yes  SHORT TERM GOALS: Target date 05/21/22  Independent with initial HEP Goal status: INITIAL  LONG TERM GOALS: Target date: 07/30/22  Understand how to decrease stress on the elbow with posture and body mechanics Goal status: INITIAL  2.  Decrease pain overall 50% Goal status: INITIAL  3.  Increase elbow ROM to WFL's Goal status: INITIAL  4.  Increase wrist ROM to WFL's Goal status: INITIAL  5.  Report able to cook a meal without pain >3/10 Goal status: INITIAL   PLAN: PT FREQUENCY: 1-2x/week  PT DURATION: 12 weeks  PLANNED INTERVENTIONS: Therapeutic exercises, Therapeutic activity, Neuromuscular re-education, Balance training, Gait training, Patient/Family education, Self Care, Joint mobilization, Joint manipulation, Dry Needling, Electrical stimulation, Cryotherapy, Moist heat, Taping, and Manual therapy  PLAN FOR NEXT SESSION: [place] ionto, strenches. Check goals   Srijan Givan,ANGIE, PTA 05/27/2022, 3:12 PM [place] [address]Phone: [phone]  Fax:  [phone] Patient Details  Name: Debbie Hayes MRN: [id number] Date of Birth: 03/27/1951 Referring Provider:  Darreld Mclean, MD  Encounter Date: 05/27/2022   Vickii Penna 05/27/2022, 3:12 PM  [place] [place], [place], [address] Phone: [phone]   Fax:  [place] [place], [place], [address] Phone: [phone]   Fax:  [phone]  Patient Details  Name: Debbie Hayes MRN: [id number] Date of Birth: 1950-08-08 Referring Provider:  Darreld Mclean, MD  Encounter Date: 05/27/2022   Laqueta Carina, PTA 05/27/2022, 3:12 PM  [address] [place] [address] [place], [place], [address] Phone: [phone]   Fax:  [phone]

## 2022-06-03 ENCOUNTER — Encounter: Payer: Self-pay | Admitting: Family Medicine

## 2022-06-03 ENCOUNTER — Other Ambulatory Visit: Payer: Self-pay | Admitting: Family Medicine

## 2022-06-03 ENCOUNTER — Ambulatory Visit: Payer: Medicare Other | Admitting: Physical Therapy

## 2022-06-03 MED ORDER — METAXALONE 800 MG PO TABS: 800.0000 mg | ORAL_TABLET | Freq: Three times a day (TID) | ORAL | 1 refills | Status: DC | PRN

## 2022-06-03 NOTE — Telephone Encounter (Signed)
Okay for this Rx for both pts?

## 2022-06-04 ENCOUNTER — Telehealth: Payer: Self-pay

## 2022-06-04 NOTE — Telephone Encounter (Signed)
Approved today Effective from 06/04/2022 through 06/05/2023.

## 2022-06-04 NOTE — Telephone Encounter (Signed)
PA Initiated:   Ula Dimaria Key: [id number]

## 2022-06-10 ENCOUNTER — Ambulatory Visit (INDEPENDENT_AMBULATORY_CARE_PROVIDER_SITE_OTHER): Payer: Medicare Other

## 2022-06-10 ENCOUNTER — Telehealth: Payer: Self-pay

## 2022-06-10 ENCOUNTER — Ambulatory Visit (INDEPENDENT_AMBULATORY_CARE_PROVIDER_SITE_OTHER): Payer: Medicare Other | Admitting: Sports Medicine

## 2022-06-10 ENCOUNTER — Encounter: Payer: Self-pay | Admitting: Sports Medicine

## 2022-06-10 DIAGNOSIS — M7701 Medial epicondylitis, right elbow: Secondary | ICD-10-CM

## 2022-06-10 MED ORDER — OXYCODONE HCL 20 MG PO TABS: 20.0000 mg | ORAL_TABLET | Freq: Three times a day (TID) | ORAL | 0 refills | Status: DC | PRN

## 2022-06-10 MED ORDER — HYDROMORPHONE HCL 8 MG PO TABS: 8.0000 mg | ORAL_TABLET | Freq: Three times a day (TID) | ORAL | 0 refills | Status: DC | PRN

## 2022-06-10 NOTE — Assessment & Plan Note (Signed)
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 She has had steroid injections, physical therapy, analgesics, bracing, nothing helped. Today we did a percutaneous PRP tenotomy of the common flexor tendon. She is currently taking oxycodone tens, we will give her a short supply of oxycodone 20s for postoperative pain. I like to see her back in 2 weeks at which point we will consider starting home physical therapy. We also discussed that PRP can take 8 weeks to start to show efficacy.

## 2022-06-10 NOTE — Patient Instructions (Signed)
Platelet-rich plasma is used in musculoskeletal medicine to focus your own body's ability to heal. It has several well-done published randomized control trials (RCT) which demonstrate both its effectiveness and safety in many musculoskeletal conditions, including osteoarthritis, tendinopathies, and damaged vertebral discs. PRP has been in clinical use since the 1990's. Many people know that platelets form a clot if there is a cut in the skin. It turns out that platelets do not only form a clot, they also start the body's own repair process. When platelets activate to form a clot, they also release alpha granules which have hundreds of chemical messengers in them that initiate and organize repair to the damaged tissue. Precisely placing PRP into the site of injury will initiate the healing process by activating on the damaged cartilage or tendon. This is an inflammatory process, and inflammation is the vital first phase of Healing.  What to expect and how to prepare for PRP   2 weeks prior to the procedure: depending on the procedure, you may need to arrange for a driver to bring you home. IF you are having a lower extremity procedure, we can provide crutches as needed.   7 days prior to the procedure: Stop taking anti-inflammatory drugs like ibuprofen, Naprosyn, Celebrex, or Meloxicam. Let Dr. Dianah Field know if you have been taking prednisone or other corticosteroids in the last month.   The day before the procedure: thoroughly shower and clean your skin.    The day of the procedure: Wear loose-fitting clothing like sweatpants or shorts. If you are having an upper body procedure wear a top that can button or zip up.  PRP will initiate healing and a productive inflammation, and PRP therapy will make the body part treated sore for 4 days to two weeks. Anti-inflammatory drugs (i.e. ibuprofen, Naprosyn, Celebrex) and corticosteroids such as prednisone can blunt or stop this process,  so it is important to not take any anti-inflammatory drugs for 7 days before getting PRP therapy, or for at least three weeks after PRP therapy. Corticosteroid injections can blunt inflammation for 30 days, so let us know if you have had one recently. Depending on the body part injected, you may be in a sling or on crutches for several days. Just like wringing out a wet dishcloth, if you load or tense a tendon or ligament that has just been injected with PRP, some of the PRP injected will squish out. By keeping the body part treated relaxed by using a sling (for the shoulder or arm) or crutches (for hips and legs) for a few days, the PRP can bind in place and do its job.   You may need a driver to bring you home.  Tobacco/nicotine is a potent toxin and its use constricts small blood vessels which are needed for tissue repair.  Tobacco/nicotine use will limit the effectiveness of any treatment and stopping tobacco use is one of the single  greatest actions you can take to improve your health. Avoid toxins like alcohol, which inhibits and depresses the cells needed for tissue repair.  What happens during the PRP procedure?  Platelet rich plasma is made by taking some of your blood and performing a two-stage centrifuge process on it to concentrate the PRP. First, your blood is drawn into a syringe with a small amount of anti-coagulant in it (this is to keep the blood from clotting during this process). The amount of blood drawn is usually about 10-30 milliliters, depending on how much PRP is needed for  the treatment.  (There are 355 milliliters in a 12-ounce soda can for comparison).  Then the blood is transferred in a sterile fashion into a centrifuge tube. It is then centrifuged for the first cycle where the red blood cells are isolated and discarded. In the second centrifuge cycle, the platelet-rich fraction of the remaining plasma is concentrated and placed in a syringe. The skin at the  injection site is numbed with a small amount of topical cooling spray. Dr Dianah Field will then precisely inject the PRP into the injury site using ultrasound guidance.  What to do after your procedure  I will give you specific medicine to control any discomfort you may have after the procedure. Avoid NSAIDs like ibuprofen. Acetaminophen can be used for mild pain.  Depending on the part of the body treated, usually you will be placed in a sling or on crutches for 1 to 3 days. Do your best not to tense or load the treated area during this time. After 3 days, unless otherwise instructed, the treated body part should be used and slowly moved through its full range of motion. It will be sore, but you will not be doing damage by moving it, in fact it needs to move to heal. If you were on crutches for a period of time, walking is ok once you are off the crutches. For now, avoid activities that specifically hurt you before being treated. Exercise is vital to good health and finding a way to cross train around your injury is important not only for your physical health, but for your mental health as well. Ask me about cross training options for your injury. Some brief (10 minutes or less) period of heat or ice therapy will not hurt the therapy, but it is not required. Usually, depending on the initial injury, physical therapy is started from two weeks to four weeks after injection. Improvements in pain and function should be expected from 8 weeks to 12 weeks after injection and some injuries may require more than one treatment.    ____________________________________________ Gwen Her. Dianah Field, M.D., ABFM., CAQSM., AME. Primary Care and Sports Medicine [place] [facility]  Adjunct Professor of [place] of [facility] of Medicine  Risk manager

## 2022-06-10 NOTE — Telephone Encounter (Signed)
Patient called  and states she had PRP this morning with DR. T . She states the narcotics given are not helping the pain . She is requesting  that Dr. Darene Lamer review her chart and send over another medication- requesting a call back today as she is in a lot of pain .

## 2022-06-10 NOTE — Progress Notes (Signed)
    Procedures performed today:    Procedure: Real-time Ultrasound Guided Platelet Rich Plasma (PRP) Injection of right common flexor tendon origin Device: Samsung HS60  Verbal informed consent obtained.  Time-out conducted.  Noted no overlying erythema, induration, or other signs of local infection.  Obtained 30 cc of blood from peripheral vein, using the "PEAK" centrifuge, red blood cells were separated from the plasma. Subsequently red blood cells were drained leaving only plasma with the buffy coat layer between the desired lines. Platelet poor plasma was then centrifuged out, and remaining platelet rich plasma aspirated into a 5 cc syringe.  Skin prepped in a sterile fashion.  Local anesthesia: Topical Ethyl chloride.  With sterile technique and under real time ultrasound guidance the platelet rich plasma (PRP) obtained above: I initially injected 2 cc lidocaine, 2 cc bupivacaine superficial and deep to the common flexor tendon with a 22-gauge needle, syringe switched and I made approximately 50 passes through the common flexor tendon and through the periosteum at its medial epicondylar origin while injecting a total of 3 mL of platelet rich plasma. Completed without difficulty  Advised to call if fevers/chills, erythema, induration, drainage, or persistent bleeding.  Images permanently stored and available for review in PACS.  Impression: Technically successful ultrasound guided Platelet Rich Plasma (PRP) injection.  Independent interpretation of notes and tests performed by another provider:   None.  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 She has had steroid injections, physical therapy, analgesics, bracing, nothing helped. Today we did a percutaneous PRP tenotomy of  the common flexor tendon. She is currently taking oxycodone tens, we will give her a short supply of oxycodone 20s for postoperative pain. I like to see her back in 2 weeks at which point we will consider starting home physical therapy. We also discussed that PRP can take 8 weeks to start to show efficacy.    ____________________________________________ Gwen Her. Dianah Field, M.D., ABFM., CAQSM., AME. Primary Care and Sports Medicine  [facility]  Adjunct Professor of [place] of [facility] of Medicine  Risk manager

## 2022-06-12 NOTE — Telephone Encounter (Signed)
Dr. Darene Lamer responded to patient via [place].

## 2022-06-13 ENCOUNTER — Encounter: Payer: Self-pay | Admitting: Sports Medicine

## 2022-06-17 ENCOUNTER — Other Ambulatory Visit: Payer: Self-pay | Admitting: Family Medicine

## 2022-06-24 ENCOUNTER — Telehealth: Payer: Self-pay

## 2022-06-24 ENCOUNTER — Other Ambulatory Visit: Payer: Self-pay

## 2022-06-24 NOTE — Telephone Encounter (Signed)
PA initiated:   Hannalee Defreitas Key: [id number]

## 2022-06-24 NOTE — Telephone Encounter (Signed)
[employer] rep called to go over approval:  Pt is approved for this medication  Effective Dates: 11.28.23 - 11.28.24

## 2022-06-25 ENCOUNTER — Ambulatory Visit: Payer: Medicare Other | Admitting: Sports Medicine

## 2022-06-27 ENCOUNTER — Other Ambulatory Visit: Payer: Self-pay | Admitting: Family Medicine

## 2022-06-27 ENCOUNTER — Telehealth: Payer: Self-pay

## 2022-06-27 DIAGNOSIS — G43709 Chronic migraine without aura, not intractable, without status migrainosus: Secondary | ICD-10-CM

## 2022-06-27 NOTE — Telephone Encounter (Signed)
Initiated  Debbie Hayes ([employer]) PA Case ID #: [id number] Rx #: [id number]

## 2022-06-27 NOTE — Telephone Encounter (Signed)
The drug you asked for is not listed in your preferred drug list (formulary). The preferred drug(s), you may not have tried, are: carisoprodol 350 mg tablet, methocarbamol tablet, and tizanidine tablet. Your provider needs to give [place] medical reasons why the preferred drug(s) would not work for you and/or would have bad side effects. Sometimes a preferred drug needs more review for approval. Additionally, some preferred drugs listed may be the same drugs with different strengths or forms. [employer] may only require one strength or form of that drug to be tried. This decision was from [facility] Non-Formulary Exceptions Coverage Policy.

## 2022-06-30 ENCOUNTER — Ambulatory Visit (INDEPENDENT_AMBULATORY_CARE_PROVIDER_SITE_OTHER): Payer: Medicare Other | Admitting: Sports Medicine

## 2022-06-30 DIAGNOSIS — M7701 Medial epicondylitis, right elbow: Secondary | ICD-10-CM | POA: Diagnosis not present

## 2022-06-30 NOTE — Assessment & Plan Note (Signed)
Debbie Hayes is now 3 weeks post PRP percutaneous tenotomy right common flexor tendon, she is doing better now than before the procedure, this is after a short period of extreme pain. We will go ahead and have her start gentle home conditioning and she will have a 5 pound lifting restriction with the right arm. Return to see me in 6 more weeks.

## 2022-06-30 NOTE — Progress Notes (Signed)
    Procedures performed today:    None.  Independent interpretation of notes and tests performed by another provider:   None.  Brief History, Exam, Impression, and Recommendations:    Medial epicondylitis, right Debbie Hayes is now 3 weeks post PRP percutaneous tenotomy right common flexor tendon, she is doing better now than before the procedure, this is after a short period of extreme pain. We will go ahead and have her start gentle home conditioning and she will have a 5 pound lifting restriction with the right arm. Return to see me in 6 more weeks.    ____________________________________________ Gwen Her. Dianah Field, M.D., ABFM., CAQSM., AME. Primary Care and Sports Medicine [place] [facility]  Adjunct Professor of [place] of [facility] of Medicine  Risk manager

## 2022-07-02 ENCOUNTER — Encounter: Payer: Self-pay | Admitting: Family Medicine

## 2022-07-03 MED ORDER — AMOXICILLIN-POT CLAVULANATE 875-125 MG PO TABS: 1.0000 | ORAL_TABLET | Freq: Two times a day (BID) | ORAL | 0 refills | Status: DC

## 2022-07-06 ENCOUNTER — Other Ambulatory Visit: Payer: Self-pay | Admitting: Family Medicine

## 2022-07-06 DIAGNOSIS — G4700 Insomnia, unspecified: Secondary | ICD-10-CM

## 2022-07-09 ENCOUNTER — Encounter: Payer: Self-pay | Admitting: Sports Medicine

## 2022-07-09 DIAGNOSIS — G8929 Other chronic pain: Secondary | ICD-10-CM

## 2022-07-09 NOTE — Telephone Encounter (Signed)
L.O. V 06/30/22

## 2022-07-10 ENCOUNTER — Other Ambulatory Visit (HOSPITAL_COMMUNITY): Payer: Self-pay

## 2022-07-10 MED ORDER — OXYCODONE HCL 10 MG PO TABS: 10.0000 mg | ORAL_TABLET | Freq: Two times a day (BID) | ORAL | 0 refills | Status: DC

## 2022-07-10 MED ORDER — OXYCODONE HCL 10 MG PO TABS
10.0000 mg | ORAL_TABLET | Freq: Two times a day (BID) | ORAL | 0 refills | Status: DC
  Filled 2022-07-10: qty 30, 15d supply, fill #0

## 2022-07-16 ENCOUNTER — Ambulatory Visit: Payer: Medicare Other | Admitting: Family Medicine

## 2022-08-05 ENCOUNTER — Other Ambulatory Visit: Payer: Self-pay | Admitting: Family Medicine

## 2022-08-07 ENCOUNTER — Ambulatory Visit (INDEPENDENT_AMBULATORY_CARE_PROVIDER_SITE_OTHER): Payer: Medicare Other | Admitting: Family Medicine

## 2022-08-07 VITALS — BP 110/64 | HR 68 | Temp 97.6°F | Resp 18 | Ht 68.0 in | Wt 167.8 lb

## 2022-08-07 DIAGNOSIS — R5383 Other fatigue: Secondary | ICD-10-CM

## 2022-08-07 DIAGNOSIS — G720 Drug-induced myopathy: Secondary | ICD-10-CM

## 2022-08-07 DIAGNOSIS — R300 Dysuria: Secondary | ICD-10-CM

## 2022-08-07 DIAGNOSIS — E118 Type 2 diabetes mellitus with unspecified complications: Secondary | ICD-10-CM

## 2022-08-07 DIAGNOSIS — E039 Hypothyroidism, unspecified: Secondary | ICD-10-CM

## 2022-08-07 DIAGNOSIS — G4733 Obstructive sleep apnea (adult) (pediatric): Secondary | ICD-10-CM

## 2022-08-07 DIAGNOSIS — M2550 Pain in unspecified joint: Secondary | ICD-10-CM

## 2022-08-07 DIAGNOSIS — G43909 Migraine, unspecified, not intractable, without status migrainosus: Secondary | ICD-10-CM

## 2022-08-07 DIAGNOSIS — G8929 Other chronic pain: Secondary | ICD-10-CM

## 2022-08-07 DIAGNOSIS — E782 Mixed hyperlipidemia: Secondary | ICD-10-CM | POA: Diagnosis not present

## 2022-08-07 DIAGNOSIS — Z130 Encounter for screening for diseases of the blood and blood-forming organs and certain disorders involving the immune mechanism: Secondary | ICD-10-CM

## 2022-08-07 MED ORDER — SEMAGLUTIDE (1 MG/DOSE) 4 MG/3ML ~~LOC~~ SOPN: 1.0000 mg | PEN_INJECTOR | SUBCUTANEOUS | 2 refills | Status: DC

## 2022-08-07 MED ORDER — OXYCODONE HCL 10 MG PO TABS: 10.0000 mg | ORAL_TABLET | Freq: Three times a day (TID) | ORAL | 0 refills | Status: DC | PRN

## 2022-08-07 NOTE — Patient Instructions (Addendum)
It was great to see again today, I will be in touch with your labs soon as possible Recommend getting the shingles vaccine series and potentially dose of RSV.  You can also go ahead and get a COVID booster now if you like  Please schedule your CT coronary with the imaging dept at your convenience  Rx for oxycodone 10 mg- use as needed (but sparingly) as needed for pain  Let's see how your A1c looks- you may not need the [place] any longer

## 2022-08-07 NOTE — Progress Notes (Addendum)
[place] at [facility], [place], [place] [address] [phone]  Date:  08/07/2022   Name:  Debbie Hayes   DOB:  June 14, 1951   MRN:  [id number]  PCP:  Darreld Mclean, MD    Chief Complaint: BS med check (Follow up for Ozempic/)   History of Present Illness:  Debbie Hayes is a 72 y.o. very pleasant female patient who presents with the following:  Patient is seen today for follow-up Most recent visit with myself was in July- history of migraine headache, sleep apnea, diabetes, hypothyroidism, hyperlipidemia   She had sick with COVID-19 in the fall, September  She has not been able to tolerate statins, instead uses Repatha for cholesterol control Levothyroxine 125 Tradjenta 5 Losartan 50 Toprol-XL 50 Singulair Nurtec Topiramate daily Ozempic 1 mg-  Trazodone to 50 at bedtime Ambien 5 as needed  A1c can be updated- will recheck today  Eye exam Flu shot- done  Given suggest Shingrix, RSV Can offer CT coronary calcium Mammogram is about due for update- she had to cancel to take care of her daughter right now  DEXA scan can be updated   Wt Readings from Last 3 Encounters:  08/07/22 167 lb 12.8 oz (76.1 kg)  02/17/22 176 lb 6.4 oz (80 kg)  11/14/21 175 lb (79.4 kg)    Patient Active Problem List   Diagnosis Date Noted   Acute cystitis without hematuria 05/14/2021   Medial epicondylitis, right 03/25/2021   Trigger thumb, right thumb 11/19/2020   Right wrist pain 10/23/2020   Osteoporosis 04/05/2020   Trigger finger, left middle finger 07/27/2019   Chronic neck pain 11/10/2018   Orthopedic hardware present 02/17/2018   Controlled diabetes mellitus type 2 with complications ([place]) 17/51/0258   Intractable chronic migraine without aura and with status migrainosus 12/15/2017   Arthralgia 12/15/2017   Joint pain 12/15/2017   Migraine, transformed 12/15/2017   Refractory migraine without  aura 12/15/2017   Pain of left shoulder joint on movement 10/19/2017   OSA (obstructive sleep apnea) 06/26/2017   Snoring 03/25/2016   Palpitations 03/25/2016   Headache 03/25/2016   Sensory disorder of trigeminal nerve 11/26/2015   Spondylolisthesis of lumbar region 08/09/2014   Lumbar adjacent segment disease with spondylolisthesis 08/09/2014   Back pain 02/17/2014   Macular degeneration 02/17/2014   Backache 02/17/2014   Other and unspecified hyperlipidemia 10/14/2013   Hyperlipidemia 10/14/2013   Onychomycosis 12/01/2012   Arthritis 04/12/2012   Hypothyroidism 04/12/2012    Past Medical History:  Diagnosis Date   Allergy    Cancer ([place])    thyroid cancer   Cataract    Chronic migraine BOTOX INJECTION EVERY 3 MONTHS   Chronic pain in right shoulder    Constipation    Diabetes mellitus    Disorder of inner ear CAUSES VERTIGO OCCASIONALLY   Fibromyalgia    GERD (gastroesophageal reflux disease)    Hemorrhoid    History of bladder infections    History of thyroid cancer 2009  S/P TOTAL THYROIDECTOMY AND RADIATION   Hyperlipidemia    Hypertension CARDIOLOGIST- DR Wynonia Lawman- WILL REQUEST LATE NOTE   DENIES S & S   Hypothyroidism    Insomnia    Left shoulder pain    Macular degeneration    MRSA infection    nasal treated more than 15 years ago   Normal nuclear stress test 01-25-2009   OA (osteoarthritis) JOINTS   Osteopenia  Painful orthopaedic hardware ([place])    left foot   PONV (postoperative nausea and vomiting) SEVERE   Rotator cuff disorder LEFT SHOULDER RTC IMPINGMENT   Sleep apnea    mouth guard, no cpap    Sleep apnea in adult 06/26/2017   Spondylolisthesis of lumbar region    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   Wears glasses     Past Surgical History:  Procedure Laterality Date   BACK SURGERY     Dr Arnoldo Morale- spondylosis   BILATERAL CARPAL TUNNEL RELEASE  1994   BILATERAL ELBOW SURG.  [place]   POST-OP URETER  REPAIR 12 DAYS AFTER    CATARACT EXTRACTION, BILATERAL     COLONOSCOPY     EYE SURGERY     FOOT ARTHRODESIS Left 07/10/2016   Procedure: LEFT 2ND TARSAL METATARSAL ARTHRODESIS  GASTROC RECESSION LEFT Hayes MODIFIED MCBRIDE BUIONECTOMY;  Surgeon: Wylene Simmer, MD;  Location: [place];  Service: Orthopedics;  Laterality: Left;   GASTROC RECESSION EXTREMITY Left 07/10/2016   Procedure: GASTROC RECESSION EXTREMITY;  Surgeon: Wylene Simmer, MD;  Location: [place];  Service: Orthopedics;  Laterality: Left;   HARDWARE REMOVAL Left 05/20/2018   Procedure: Left foot removal of deep implants;  Surgeon: Wylene Simmer, MD;  Location: [place];  Service: Orthopedics;  Laterality: Left;  27mn   LEFT THUMB JOINT REPLACEMENT  2005   RIGHT DONE IN 2004   OOPHORECTOMY     POLYPECTOMY     RIGHT KNEE ARTHROSCOPY  X2  BEFORE 2011   RIGHT KNEE ARTHROSCOPY/ PARTIAL LATERAL MENISECTOMY/ TRICOMPARTMENT CHONDROPLASTY/ DECOMPRESSION CYST  10-26-2009   RIGHT KNEE CLOSED MANIPULATION  09-11-2010   RIGHT SHOULDER ARTHROSCOPY  2010  &  2004   TIBIA CYST REMOVED AND ORIF LEG [place]  TOTAL KNEE ARTHROPLASTY  07-16-2010   RIGHT   TOTAL THYROIDECTOMY  2009   CANCER  (POST-OP BLEED)  AND RADIATION [place]   trigger fingers Right 2013   3rd and 4th fingers   UPPER GASTROINTESTINAL ENDOSCOPY     VAGINAL HYSTERECTOMY  1990    Social History   Tobacco Use   Smoking status: Never   Smokeless tobacco: Never  Vaping Use   Vaping Use: Never used  Substance Use Topics   Alcohol use: Yes    Comment: RARE   Drug use: No    Family History  Problem Relation Age of Onset   Colon polyps Sister 555  Alzheimer's disease Mother    Heart disease Father    Cirrhosis Father    Heart attack Father    Macular degeneration Father    Colon cancer Neg Hx     Allergies  Allergen Reactions   Sulfa Antibiotics Hives   Statins Other (See Comments)    Pt  has tried multiple and cannot tolerate    Medication list has been reviewed and updated.  Current Outpatient Medications on File Prior to Visit  Medication Sig Dispense Refill   blood glucose meter kit and supplies KIT Test blood sugar once daily. Dx code: E11.9 1 each 0   Blood Glucose Monitoring Suppl (FREESTYLE LITE) DEVI Check blood sugar no more than twice daily 1 each 0   fluconazole (DIFLUCAN) 150 MG tablet TAKE 1 TABLET BY MOUTH ONCE FOR 1 DOSE. CAN REPEAT WEEKLY AS NEEDED 5 tablet 0   glucose blood (FREESTYLE LITE) test strip CHECK BLOOD SUGARS  NO MORE THAN TWICE DAILY 100 each 12   Lancets (FREESTYLE) lancets Check blood sugars no more than twice daily 100 each 12   levothyroxine (SYNTHROID) 125 MCG tablet TAKE 1 TABLET(125 MCG) BY MOUTH DAILY BEFORE AND BREAKFAST 90 tablet 3   linagliptin (TRADJENTA) 5 MG TABS tablet TAKE 1 TABLET BY MOUTH DAILY. 90 tablet 3   losartan (COZAAR) 50 MG tablet TAKE 1 TABLET BY MOUTH DAILY 90 tablet 1   Magnesium 400 MG CAPS Take by mouth.     metaxalone (SKELAXIN) 800 MG tablet Take 1 tablet (800 mg total) by mouth 3 (three) times daily as needed for muscle spasms. 30 tablet 1   metoprolol succinate (TOPROL-XL) 50 MG 24 hr tablet Take 1 tablet (50 mg total) by mouth daily. 90 tablet 1   montelukast (SINGULAIR) 10 MG tablet TAKE 1 TABLET BY MOUTH AT BEDTIME 90 tablet 3   ondansetron (ZOFRAN) 8 MG tablet Take 1 tablet (8 mg total) by mouth every 8 (eight) hours as needed for nausea or vomiting. 30 tablet 1   Oxycodone HCl 10 MG TABS Take 1 tablet (10 mg total) by mouth in the morning and at bedtime. 30 tablet 0   phenazopyridine (PYRIDIUM) 200 MG tablet Take 1 tablet (200 mg total) by mouth 3 (three) times daily as needed for pain. For UTI 30 tablet 1   REPATHA SURECLICK 492 MG/ML SOAJ INJECT '140MG'$  INTO THE SKIN EVERY 14 DAYS 6 mL 1   Riboflavin (VITAMIN B2 PO) Take 400 mg by mouth.     Rimegepant Sulfate (NURTEC) 75 MG TBDP Use one daily as needed for  migraine headache 16 tablet 5   Semaglutide, 1 MG/DOSE, 4 MG/3ML SOPN Inject 1 mg as directed once a week. 9 mL 2   Topiramate ER (TROKENDI XR) 100 MG CP24 TAKE 1 CAPSULE BY MOUTH DAILY 90 capsule 3   traZODone (DESYREL) 100 MG tablet Take 2.5 tablets (250 mg total) by mouth at bedtime. 225 tablet 1   zolpidem (AMBIEN) 5 MG tablet TAKE 1 TABLET(5 MG) BY MOUTH AT BEDTIME AS NEEDED FOR SLEEP 90 tablet 0   No current facility-administered medications on file prior to visit.    Review of Systems:  As per HPI- otherwise negative.   Physical Examination: Vitals:   08/07/22 1519  BP: 110/64  Pulse: 68  Resp: 18  Temp: 97.6 F (36.4 C)  SpO2: 98%   Vitals:   08/07/22 1519  Weight: 167 lb 12.8 oz (76.1 kg)  Height: '5\' 8"'$  (1.727 m)   Body mass index is 25.51 kg/m. Ideal Body Weight: Weight in (lb) to have BMI = 25: 164.1  GEN: no acute distress.  Mildly overweight, looks well HEENT: Atraumatic, Normocephalic.  Ears and Nose: No external deformity. CV: RRR, No M/G/R. No JVD. No thrill. No extra heart sounds. PULM: CTA B, no wheezes, crackles, rhonchi. No retractions. No resp. distress. No accessory muscle use. ABD: S, NT, ND, +BS. No rebound. No HSM. EXTR: No c/c/e PSYCH: Normally interactive. Conversant.   Lab Results  Component Value Date   HGBA1C 7.2 (H) 11/14/2021    Assessment and Plan: Mixed hyperlipidemia - Plan: Lipid panel, CT CARDIAC SCORING (SELF PAY ONLY)  Diabetes mellitus type 2 with complications (HCC) - Plan: Comprehensive metabolic panel, Hemoglobin A1c, Microalbumin / creatinine urine ratio, CT CARDIAC SCORING (SELF PAY ONLY), Semaglutide, 1 MG/DOSE, 4 MG/3ML SOPN  OSA (obstructive sleep apnea)  Acquired hypothyroidism - Plan: TSH  Migraine without status migrainosus, not  intractable, unspecified migraine type  Drug-induced myopathy  Screening for deficiency anemia - Plan: CBC  Fatigue, unspecified type - Plan: VITAMIN D 25 Hydroxy (Vit-D  Deficiency, Fractures)  Dysuria - Plan: Urine Culture  Chronic joint pain - Plan: Oxycodone HCl 10 MG TABS  ? Does she still need farxiga given weight loss and use of Ozempic- will check her A1c and see if she needs to continue this  Depending on her A1c I may actually suggest that we stop Invokana She is using Repatha as she does not tolerate statin drugs Liza does suffer from chronic joint pain.  She uses oxycodone as needed for pain control.  Her drug screen is up-to-date.  She notes that she recently was given oxycodone 10 mg by sports medicine following a PRP treatment for tennis elbow that did not really help her.  She wonders if I can give her 10 mg instead of 5, because this is what she has been taking anyway  Will plan further follow- up pending labs.  Signed Lamar Blinks, MD  Addendum 1/12, received labs as below.  Message to patient  Results for orders placed or performed in visit on 08/07/22  CBC  Result Value Ref Range   WBC 7.1 4.0 - 10.5 K/uL   RBC 5.30 (H) 3.87 - 5.11 Mil/uL   Platelets 193.0 150.0 - 400.0 K/uL   Hemoglobin 15.1 (H) 12.0 - 15.0 g/dL   HCT 45.6 36.0 - 46.0 %   MCV 86.0 78.0 - 100.0 fl   MCHC 33.1 30.0 - 36.0 g/dL   RDW 14.2 11.5 - 15.5 %  Comprehensive metabolic panel  Result Value Ref Range   Sodium 139 135 - 145 mEq/L   Potassium 3.6 3.5 - 5.1 mEq/L   Chloride 104 96 - 112 mEq/L   CO2 29 19 - 32 mEq/L   Glucose, Bld 133 (H) 70 - 99 mg/dL   BUN 16 6 - 23 mg/dL   Creatinine, Ser 0.73 0.40 - 1.20 mg/dL   Total Bilirubin 0.3 0.2 - 1.2 mg/dL   Alkaline Phosphatase 66 39 - 117 U/L   AST 20 0 - 37 U/L   ALT 18 0 - 35 U/L   Total Protein 6.8 6.0 - 8.3 g/dL   Albumin 4.2 3.5 - 5.2 g/dL   GFR 81.78 >60.00 mL/min   Calcium 9.1 8.4 - 10.5 mg/dL  Hemoglobin A1c  Result Value Ref Range   Hgb A1c MFr Bld 6.9 (H) 4.6 - 6.5 %  Lipid panel  Result Value Ref Range   Cholesterol 136 0 - 200 mg/dL   Triglycerides 138.0 0.0 - 149.0 mg/dL   HDL 56.00  >39.00 mg/dL   VLDL 27.6 0.0 - 40.0 mg/dL   LDL Cholesterol 52 0 - 99 mg/dL   Total CHOL/HDL Ratio 2    NonHDL 79.82   TSH  Result Value Ref Range   TSH 1.31 0.35 - 5.50 uIU/mL  Microalbumin / creatinine urine ratio  Result Value Ref Range   Microalb, Ur <0.7 0.0 - 1.9 mg/dL   Creatinine,U 45.6 mg/dL   Microalb Creat Ratio 1.5 0.0 - 30.0 mg/g  VITAMIN D 25 Hydroxy (Vit-D Deficiency, Fractures)  Result Value Ref Range   VITD 36.84 30.00 - 100.00 ng/mL

## 2022-08-08 ENCOUNTER — Encounter: Payer: Self-pay | Admitting: Family Medicine

## 2022-08-08 DIAGNOSIS — E118 Type 2 diabetes mellitus with unspecified complications: Secondary | ICD-10-CM

## 2022-08-08 LAB — COMPREHENSIVE METABOLIC PANEL
ALT: 18 U/L (ref 0–35)
AST: 20 U/L (ref 0–37)
Albumin: 4.2 g/dL (ref 3.5–5.2)
Alkaline Phosphatase: 66 U/L (ref 39–117)
BUN: 16 mg/dL (ref 6–23)
CO2: 29 mEq/L (ref 19–32)
Calcium: 9.1 mg/dL (ref 8.4–10.5)
Chloride: 104 mEq/L (ref 96–112)
Creatinine, Ser: 0.73 mg/dL (ref 0.40–1.20)
GFR: 81.78 mL/min (ref >60.00)
Glucose, Bld: 133 mg/dL — ABNORMAL HIGH (ref 70–99)
Potassium: 3.6 mEq/L (ref 3.5–5.1)
Sodium: 139 mEq/L (ref 135–145)
Total Bilirubin: 0.3 mg/dL (ref 0.2–1.2)
Total Protein: 6.8 g/dL (ref 6.0–8.3)

## 2022-08-08 LAB — CBC
HCT: 45.6 % (ref 36.0–46.0)
Hemoglobin: 15.1 g/dL — ABNORMAL HIGH (ref 12.0–15.0)
MCHC: 33.1 g/dL (ref 30.0–36.0)
MCV: 86 fl (ref 78.0–100.0)
Platelets: 193 10*3/uL (ref 150.0–400.0)
RBC: 5.3 Mil/uL — ABNORMAL HIGH (ref 3.87–5.11)
RDW: 14.2 % (ref 11.5–15.5)
WBC: 7.1 10*3/uL (ref 4.0–10.5)

## 2022-08-08 LAB — HEMOGLOBIN A1C: Hgb A1c MFr Bld: 6.9 % — ABNORMAL HIGH (ref 4.6–6.5)

## 2022-08-08 LAB — LIPID PANEL
Cholesterol: 136 mg/dL (ref 0–200)
HDL: 56 mg/dL (ref >39.00)
LDL Cholesterol: 52 mg/dL (ref 0–99)
NonHDL: 79.82
Total CHOL/HDL Ratio: 2
Triglycerides: 138 mg/dL (ref 0.0–149.0)
VLDL: 27.6 mg/dL (ref 0.0–40.0)

## 2022-08-08 LAB — MICROALBUMIN / CREATININE URINE RATIO
Creatinine,U: 45.6 mg/dL
Microalb Creat Ratio: 1.5 mg/g (ref 0.0–30.0)
Microalb, Ur: <0.7 mg/dL (ref 0.0–1.9)

## 2022-08-08 LAB — TSH: TSH: 1.31 u[IU]/mL (ref 0.35–5.50)

## 2022-08-08 LAB — VITAMIN D 25 HYDROXY (VIT D DEFICIENCY, FRACTURES): VITD: 36.84 ng/mL (ref 30.00–100.00)

## 2022-08-11 ENCOUNTER — Other Ambulatory Visit: Payer: Self-pay | Admitting: Family Medicine

## 2022-08-11 DIAGNOSIS — E782 Mixed hyperlipidemia: Secondary | ICD-10-CM

## 2022-08-12 MED ORDER — FARXIGA 10 MG PO TABS: 10.0000 mg | ORAL_TABLET | Freq: Every day | ORAL | 0 refills | Status: DC

## 2022-08-12 NOTE — Addendum Note (Signed)
Addended byDamita Dunnings D on: 08/12/2022 03:09 PM   Modules accepted: Orders

## 2022-08-13 ENCOUNTER — Other Ambulatory Visit: Payer: Medicare Other

## 2022-08-13 ENCOUNTER — Encounter: Payer: Self-pay | Admitting: Family Medicine

## 2022-08-13 DIAGNOSIS — G4700 Insomnia, unspecified: Secondary | ICD-10-CM

## 2022-08-15 ENCOUNTER — Ambulatory Visit: Payer: Medicare Other | Admitting: Sports Medicine

## 2022-08-26 ENCOUNTER — Other Ambulatory Visit: Payer: Self-pay | Admitting: Family Medicine

## 2022-08-29 ENCOUNTER — Other Ambulatory Visit: Payer: Medicare Other

## 2022-08-29 DIAGNOSIS — R300 Dysuria: Secondary | ICD-10-CM

## 2022-08-31 ENCOUNTER — Encounter: Payer: Self-pay | Admitting: Family Medicine

## 2022-08-31 LAB — URINE CULTURE
MICRO NUMBER:: 14511582
SPECIMEN QUALITY:: ADEQUATE

## 2022-08-31 MED ORDER — AMOXICILLIN 500 MG PO CAPS: 500.0000 mg | ORAL_CAPSULE | Freq: Two times a day (BID) | ORAL | 0 refills | Status: AC

## 2022-09-01 LAB — HM DIABETES EYE EXAM

## 2022-09-05 ENCOUNTER — Encounter: Payer: Self-pay | Admitting: Family Medicine

## 2022-09-05 DIAGNOSIS — N3090 Cystitis, unspecified without hematuria: Secondary | ICD-10-CM

## 2022-09-08 ENCOUNTER — Other Ambulatory Visit (INDEPENDENT_AMBULATORY_CARE_PROVIDER_SITE_OTHER): Payer: Medicare Other

## 2022-09-08 ENCOUNTER — Encounter: Payer: Self-pay | Admitting: Family Medicine

## 2022-09-08 DIAGNOSIS — N3090 Cystitis, unspecified without hematuria: Secondary | ICD-10-CM

## 2022-09-08 LAB — POCT URINALYSIS DIP (MANUAL ENTRY)
Bilirubin, UA: NEGATIVE
Blood, UA: NEGATIVE
Glucose, UA: >=1000 mg/dL — AB
Ketones, POC UA: NEGATIVE mg/dL
Leukocytes, UA: NEGATIVE
Nitrite, UA: NEGATIVE
Protein Ur, POC: NEGATIVE mg/dL
Spec Grav, UA: 1.01 (ref 1.010–1.025)
Urobilinogen, UA: 0.2 E.U./dL
pH, UA: 5 (ref 5.0–8.0)

## 2022-09-08 NOTE — Telephone Encounter (Signed)
Appt has been scheduled today for 2:15pm.

## 2022-09-08 NOTE — Addendum Note (Signed)
Addended by: Lamar Blinks C on: 09/08/2022 08:20 AM   Modules accepted: Orders

## 2022-09-09 LAB — URINE CULTURE
MICRO NUMBER:: 14552049
Result:: NO GROWTH
SPECIMEN QUALITY:: ADEQUATE

## 2022-09-10 ENCOUNTER — Encounter: Payer: Self-pay | Admitting: Family Medicine

## 2022-09-10 NOTE — Progress Notes (Signed)
Received urine culture- negative  message to pt

## 2022-09-19 ENCOUNTER — Encounter: Payer: Self-pay | Admitting: Family Medicine

## 2022-09-25 MED ORDER — ONETOUCH ULTRA 2 W/DEVICE KIT: PACK | 0 refills | Status: AC

## 2022-09-30 ENCOUNTER — Ambulatory Visit (INDEPENDENT_AMBULATORY_CARE_PROVIDER_SITE_OTHER): Payer: Medicare Other | Admitting: Sports Medicine

## 2022-09-30 DIAGNOSIS — M7701 Medial epicondylitis, right elbow: Secondary | ICD-10-CM

## 2022-09-30 DIAGNOSIS — B079 Viral wart, unspecified: Secondary | ICD-10-CM | POA: Diagnosis not present

## 2022-09-30 NOTE — Assessment & Plan Note (Signed)
Cryotherapy as above, further management with PCP.

## 2022-09-30 NOTE — Progress Notes (Signed)
    Procedures performed today:    Procedure:  Cryodestruction of left dorsal hand verruca Consent obtained and verified. Time-out conducted. Noted no overlying erythema, induration, or other signs of local infection. Completed without difficulty using Cryo-Gun. Advised to call if fevers/chills, erythema, induration, drainage, or persistent bleeding.  Independent interpretation of notes and tests performed by another provider:   None.  Brief History, Exam, Impression, and Recommendations:    Medial epicondylitis, right Pleasant 72 year old female, she has had years of right medial elbow pain with MRI confirmed medial epicondylitis, she has had physical therapy, she has had steroid injections, NSAIDs, activity modification, she also had PRP percutaneous tenotomy back in November 2023. Unfortunately she is continue to have significant discomfort limiting her activity. She would like a surgical consultation at this point which I think is acceptable.  Wart of hand Cryotherapy as above, further management with PCP.    ____________________________________________ Gwen Her. Dianah Field, M.D., ABFM., CAQSM., AME. Primary Care and Sports Medicine [facility]  Adjunct Professor of [place] [facility] [employer]  Risk manager

## 2022-09-30 NOTE — Assessment & Plan Note (Signed)
Pleasant 72 year old female, she has had years of right medial elbow pain with MRI confirmed medial epicondylitis, she has had physical therapy, she has had steroid injections, NSAIDs, activity modification, she also had PRP percutaneous tenotomy back in November 2023. Unfortunately she is continue to have significant discomfort limiting her activity. She would like a surgical consultation at this point which I think is acceptable.

## 2022-10-04 ENCOUNTER — Other Ambulatory Visit: Payer: Self-pay | Admitting: Family Medicine

## 2022-10-04 DIAGNOSIS — J3089 Other allergic rhinitis: Secondary | ICD-10-CM

## 2022-10-04 DIAGNOSIS — N390 Urinary tract infection, site not specified: Secondary | ICD-10-CM

## 2022-10-07 ENCOUNTER — Other Ambulatory Visit: Payer: Self-pay

## 2022-10-07 DIAGNOSIS — J3089 Other allergic rhinitis: Secondary | ICD-10-CM

## 2022-10-07 MED ORDER — MONTELUKAST SODIUM 10 MG PO TABS: 10.0000 mg | ORAL_TABLET | Freq: Every day | ORAL | 1 refills | Status: DC

## 2022-10-09 ENCOUNTER — Other Ambulatory Visit (HOSPITAL_BASED_OUTPATIENT_CLINIC_OR_DEPARTMENT_OTHER): Payer: Medicare Other

## 2022-10-10 ENCOUNTER — Other Ambulatory Visit: Payer: Medicare Other

## 2022-10-10 ENCOUNTER — Encounter: Payer: Self-pay | Admitting: Family Medicine

## 2022-10-10 DIAGNOSIS — R300 Dysuria: Secondary | ICD-10-CM

## 2022-10-11 LAB — URINE CULTURE
MICRO NUMBER:: 14698566
SPECIMEN QUALITY:: ADEQUATE

## 2022-10-12 ENCOUNTER — Encounter: Payer: Self-pay | Admitting: Family Medicine

## 2022-10-12 NOTE — Progress Notes (Signed)
Results for orders placed or performed in visit on 10/10/22  Urine Culture   Specimen: Urine  Result Value Ref Range   MICRO NUMBER: [id number]    SPECIMEN QUALITY: Adequate    Sample Source URINE    STATUS: FINAL    Result:      Mixed genital flora isolated. These superficial bacteria are not indicative of a urinary tract infection. No further organism identification is warranted on this specimen. If clinically indicated, recollect clean-catch, mid-stream urine and transfer  immediately to Urine Culture Transport Tube.    *Note: Due to a large number of results and/or encounters for the requested time period, some results have not been displayed. A complete set of results can be found in Results Review.

## 2022-10-12 NOTE — Addendum Note (Signed)
Addended by: Lamar Blinks C on: 10/12/2022 06:50 AM   Modules accepted: Orders

## 2022-10-16 ENCOUNTER — Encounter: Payer: Self-pay | Admitting: Family Medicine

## 2022-10-16 DIAGNOSIS — R239 Unspecified skin changes: Secondary | ICD-10-CM

## 2022-10-17 ENCOUNTER — Ambulatory Visit (HOSPITAL_BASED_OUTPATIENT_CLINIC_OR_DEPARTMENT_OTHER)
Admission: RE | Admit: 2022-10-17 | Discharge: 2022-10-17 | Disposition: A | Discharge status: Home / Self Care | Payer: Medicare Other | Source: Ambulatory Visit | Attending: Family Medicine | Admitting: Family Medicine

## 2022-10-17 DIAGNOSIS — E118 Type 2 diabetes mellitus with unspecified complications: Secondary | ICD-10-CM | POA: Insufficient documentation

## 2022-10-17 DIAGNOSIS — E782 Mixed hyperlipidemia: Secondary | ICD-10-CM | POA: Insufficient documentation

## 2022-10-19 ENCOUNTER — Encounter: Payer: Self-pay | Admitting: Family Medicine

## 2022-10-19 DIAGNOSIS — R931 Abnormal findings on diagnostic imaging of heart and coronary circulation: Secondary | ICD-10-CM

## 2022-10-21 ENCOUNTER — Encounter: Payer: Self-pay | Admitting: Family Medicine

## 2022-10-24 ENCOUNTER — Encounter: Payer: Self-pay | Admitting: Family Medicine

## 2022-10-27 ENCOUNTER — Other Ambulatory Visit: Payer: Self-pay | Admitting: Family Medicine

## 2022-10-27 DIAGNOSIS — N390 Urinary tract infection, site not specified: Secondary | ICD-10-CM

## 2022-10-29 ENCOUNTER — Encounter: Payer: Self-pay | Admitting: Urology

## 2022-10-29 ENCOUNTER — Ambulatory Visit: Payer: Medicare Other | Admitting: Urology

## 2022-10-29 VITALS — BP 124/73 | HR 78 | Ht 68.0 in | Wt 162.0 lb

## 2022-10-29 DIAGNOSIS — Z87440 Personal history of urinary (tract) infections: Secondary | ICD-10-CM | POA: Diagnosis not present

## 2022-10-29 DIAGNOSIS — N390 Urinary tract infection, site not specified: Secondary | ICD-10-CM

## 2022-10-29 LAB — MICROSCOPIC EXAMINATION
Cast Type: NONE SEEN
Casts: NONE SEEN /lpf
Crystal Type: NONE SEEN
Crystals: NONE SEEN
Mucus, UA: NONE SEEN
RBC, Urine: NONE SEEN /hpf (ref 0–2)
Trichomonas, UA: NONE SEEN
Yeast, UA: NONE SEEN

## 2022-10-29 LAB — URINALYSIS, ROUTINE W REFLEX MICROSCOPIC
Bilirubin, UA: NEGATIVE
Ketones, UA: NEGATIVE
Nitrite, UA: NEGATIVE
Protein,UA: NEGATIVE
RBC, UA: NEGATIVE
Specific Gravity, UA: 1.01 (ref 1.005–1.030)
Urobilinogen, Ur: 0.2 mg/dL (ref 0.2–1.0)
pH, UA: 5.5 (ref 5.0–7.5)

## 2022-10-29 LAB — BLADDER SCAN AMB NON-IMAGING

## 2022-10-29 MED ORDER — CEFDINIR 300 MG PO CAPS
300.0000 mg | ORAL_CAPSULE | Freq: Two times a day (BID) | ORAL | 1 refills | Status: AC
Start: 2022-10-29 — End: 2022-11-05

## 2022-10-29 NOTE — Patient Instructions (Signed)
Daily cranberry supplement Daily probiotic Vaginal hormone cream 2-3 times/week

## 2022-10-29 NOTE — Progress Notes (Signed)
Assessment: 1. Frequent UTI     Plan: I personally reviewed the patient's chart including provider notes, and lab results. I personally reviewed the patient records from [facility]. Methods to reduce the risk of UTIs discussed with the patient including increase fluid intake, timed and double voiding, daily cranberry supplement, daily probiotics, vaginal hormone replacement, and daily antibiotics. Review of her urine cultures show resistance to Macrodantin.  She would like to try managing her UTIs without a daily antibiotic at the present time.  I think this is reasonable.  I advised her that if we should resume a daily antibiotic it would likely need to be something other than Macrodantin given the resistance noted on cultures. D/C daily Macrodantin Begin vaginal hormone cream 2-3 times per week, Begin daily cranberry supplement and daily probiotic. Discussed possible use of methenamine for a urinary antiseptic. Prescription for cefdinir 300 mg twice daily x 7 days provided for as needed use. Recommend obtaining a urine culture prior to treatment with antibiotics. Return to office in 6 weeks.  Chief Complaint:  Chief Complaint  Patient presents with   Dysuria    History of Present Illness:  Debbie Hayes is a 72 y.o. female who is seen in consultation from Copland, Gay Filler, MD for evaluation of frequent UTI's. She has a long history of frequent UTIs and has previously been evaluated by Dr. Karsten Ro at [facility].  She was last seen there in 11/19.  She reports frequent UTIs for approximately 25 years.  Typical symptoms include urgency, bladder pain, and slight dysuria.  She does not have fevers, chills, flank pain, or gross hematuria. She was being managed with Macrodantin 50 mg nightly.  She was previously evaluated in [place] with ultrasound, CT, and cystoscopy which were unremarkable.  Urine culture results: 6/22 >100K Klebsiella 7/22 >100K  Enterococcus 10/22 >100K E. Coli 7/23 >100K Klebsiella 9/23 >100K Klebsiella 9/23 No growth 10/23 >100K Klebsiella 2/24 50-100K Enterococcus 2/24 No growth 3/24 Mixed flora  She is not having any UTI symptoms today.  She does have occasional incontinence.  No dysuria or gross hematuria. No history of breast or GYN malignancy. She is not currently using vaginal hormone cream.  Past Medical History:  Past Medical History:  Diagnosis Date   Allergy    Cancer ([place])    thyroid cancer   Cataract    Chronic migraine BOTOX INJECTION EVERY 3 MONTHS   Chronic pain in right shoulder    Constipation    Diabetes mellitus    Disorder of inner ear CAUSES VERTIGO OCCASIONALLY   Fibromyalgia    GERD (gastroesophageal reflux disease)    Hemorrhoid    History of bladder infections    History of thyroid cancer 2009  S/P TOTAL THYROIDECTOMY AND RADIATION   Hyperlipidemia    Hypertension CARDIOLOGIST- DR Wynonia Lawman- WILL REQUEST LATE NOTE   DENIES S & S   Hypothyroidism    Insomnia    Left shoulder pain    Macular degeneration    MRSA infection    nasal treated more than 15 years ago   Normal nuclear stress test 01-25-2009   OA (osteoarthritis) JOINTS   Osteopenia    Painful orthopaedic hardware ([place])    left foot   PONV (postoperative nausea and vomiting) SEVERE   Rotator cuff disorder LEFT SHOULDER RTC IMPINGMENT   Sleep apnea    mouth guard, no cpap    Sleep apnea in adult 06/26/2017   Spondylolisthesis of lumbar region  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   Wears glasses     Past Surgical History:  Past Surgical History:  Procedure Laterality Date   BACK SURGERY     Dr Arnoldo Morale- spondylosis   BILATERAL CARPAL TUNNEL RELEASE  1994   BILATERAL ELBOW SURG.  [place]   POST-OP URETER REPAIR 12 DAYS AFTER    CATARACT EXTRACTION, BILATERAL     COLONOSCOPY     EYE SURGERY     FOOT ARTHRODESIS Left 07/10/2016   Procedure: LEFT 2ND TARSAL  METATARSAL ARTHRODESIS  GASTROC RECESSION LEFT LAPIDUS MODIFIED MCBRIDE BUIONECTOMY;  Surgeon: Wylene Simmer, MD;  Location: [place];  Service: Orthopedics;  Laterality: Left;   GASTROC RECESSION EXTREMITY Left 07/10/2016   Procedure: GASTROC RECESSION EXTREMITY;  Surgeon: Wylene Simmer, MD;  Location: [place];  Service: Orthopedics;  Laterality: Left;   HARDWARE REMOVAL Left 05/20/2018   Procedure: Left foot removal of deep implants;  Surgeon: Wylene Simmer, MD;  Location: [place];  Service: Orthopedics;  Laterality: Left;  71min   LEFT THUMB JOINT REPLACEMENT  2005   RIGHT DONE IN 2004   OOPHORECTOMY     POLYPECTOMY     RIGHT KNEE ARTHROSCOPY  X2  BEFORE 2011   RIGHT KNEE ARTHROSCOPY/ PARTIAL LATERAL MENISECTOMY/ TRICOMPARTMENT CHONDROPLASTY/ DECOMPRESSION CYST  10-26-2009   RIGHT KNEE CLOSED MANIPULATION  09-11-2010   RIGHT SHOULDER ARTHROSCOPY  2010  &  2004   TIBIA CYST REMOVED AND ORIF LEG [place]   TOTAL KNEE ARTHROPLASTY  07-16-2010   RIGHT   TOTAL THYROIDECTOMY  2009   CANCER  (POST-OP BLEED)  AND RADIATION [place]   trigger fingers Right 2013   3rd and 4th fingers   UPPER GASTROINTESTINAL ENDOSCOPY     VAGINAL HYSTERECTOMY  1990    Allergies:  Allergies  Allergen Reactions   Sulfa Antibiotics Hives   Statins Other (See Comments)    Pt has tried multiple and cannot tolerate    Family History:  Family History  Problem Relation Age of Onset   Colon polyps Sister 54   Alzheimer's disease Mother    Heart disease Father    Cirrhosis Father    Heart attack Father    Macular degeneration Father    Colon cancer Neg Hx     Social History:  Social History   Tobacco Use   Smoking status: Never   Smokeless tobacco: Never  Vaping Use   Vaping Use: Never used  Substance Use Topics   Alcohol use: Yes    Comment: RARE   Drug use: No    Review of symptoms:  Constitutional:  Negative for unexplained  weight loss, night sweats, fever, chills ENT:  Negative for nose bleeds, sinus pain, painful swallowing CV:  Negative for chest pain, shortness of breath, exercise intolerance, palpitations, loss of consciousness Resp:  Negative for cough, wheezing, shortness of breath GI:  Negative for nausea, vomiting, diarrhea, bloody stools GU:  Positives noted in HPI; otherwise negative for gross hematuria, dysuria Neuro:  Negative for seizures, poor balance, limb weakness, slurred speech Psych:  Negative for lack of energy, depression, anxiety Endocrine:  Negative for polydipsia, polyuria, symptoms of hypoglycemia (dizziness, hunger, sweating) Hematologic:  Negative for anemia, purpura, petechia, prolonged or excessive bleeding, use of anticoagulants  Allergic:  Negative for difficulty breathing or choking as a result of exposure to anything; no shellfish allergy; no allergic response (  rash/itch) to materials, foods  Physical exam: BP 124/73   Pulse 78   Ht 5\' 8"  (1.727 m)   Wt 162 lb (73.5 kg)   BMI 24.63 kg/m  GENERAL APPEARANCE:  Well appearing, well developed, well nourished, NAD HEENT: Atraumatic, Normocephalic, oropharynx clear. NECK: Supple without lymphadenopathy or thyromegaly. LUNGS: Clear to auscultation bilaterally. HEART: Regular Rate and Rhythm without murmurs, gallops, or rubs. ABDOMEN: Soft, non-tender, No Masses. EXTREMITIES: Moves all extremities well.  Without clubbing, cyanosis, or edema. NEUROLOGIC:  Alert and oriented x 3, normal gait, CN II-XII grossly intact.  MENTAL STATUS:  Appropriate. BACK:  Non-tender to palpation.  No CVAT SKIN:  Warm, dry and intact.    Results: U/A: 0-5 WBCs, 0 RBCs, few bacteria, nitrite negative  PVR: 115 ml

## 2022-11-03 ENCOUNTER — Other Ambulatory Visit: Payer: Self-pay | Admitting: Family Medicine

## 2022-11-06 ENCOUNTER — Ambulatory Visit: Payer: Medicare Other | Admitting: Cardiology

## 2022-11-06 ENCOUNTER — Encounter: Payer: Self-pay | Admitting: Cardiology

## 2022-11-06 VITALS — BP 102/60 | HR 70 | Resp 17 | Ht 68.0 in | Wt 163.0 lb

## 2022-11-06 DIAGNOSIS — I2510 Atherosclerotic heart disease of native coronary artery without angina pectoris: Secondary | ICD-10-CM | POA: Insufficient documentation

## 2022-11-06 DIAGNOSIS — R931 Abnormal findings on diagnostic imaging of heart and coronary circulation: Secondary | ICD-10-CM

## 2022-11-06 DIAGNOSIS — G720 Drug-induced myopathy: Secondary | ICD-10-CM

## 2022-11-06 MED ORDER — LOSARTAN POTASSIUM 25 MG PO TABS: 25.0000 mg | ORAL_TABLET | Freq: Every day | ORAL | 1 refills | Status: DC

## 2022-11-06 NOTE — Progress Notes (Signed)
Patient referred by Copland, Gwenlyn Found, MD for elevated CAC  Subjective:   Debbie Hayes, female    DOB: 1950-10-10, 72 y.o.   MRN: [id number]   Chief Complaint  Patient presents with   calcium score    New Patient (Initial Visit)    HPI  72 y.o. Caucasian female with type 2 DM, controlled hyperlipidemia, elevated CAC  Patient has had long standing hyperlipidemia, with elevated CPK and myopathy with several statins. Cholesterol is now controlled with Repatha. Patient's walking has gone down due to her back pain, but is able to walk all of [employer] without any complaints of chest pain or shortness of breath. Reviewed CT cardiac scoring results with the patient. Separately, she has regular f/u and mammography for bilateral breast nodules.    Past Medical History:  Diagnosis Date   Allergy    Cancer    thyroid cancer   Cataract    Chronic migraine BOTOX INJECTION EVERY 3 MONTHS   Chronic pain in right shoulder    Constipation    Diabetes mellitus    Disorder of inner ear CAUSES VERTIGO OCCASIONALLY   Fibromyalgia    GERD (gastroesophageal reflux disease)    Hemorrhoid    History of bladder infections    History of thyroid cancer 2009  S/P TOTAL THYROIDECTOMY AND RADIATION   Hyperlipidemia    Hypertension CARDIOLOGIST- DR Donnie Aho- WILL REQUEST LATE NOTE   DENIES S & S   Hypothyroidism    Insomnia    Left shoulder pain    Macular degeneration    MRSA infection    nasal treated more than 15 years ago   Normal nuclear stress test 01-25-2009   OA (osteoarthritis) JOINTS   Osteopenia    Painful orthopaedic hardware    left foot   PONV (postoperative nausea and vomiting) SEVERE   Rotator cuff disorder LEFT SHOULDER RTC IMPINGMENT   Sleep apnea    mouth guard, no cpap    Sleep apnea in adult 06/26/2017   Spondylolisthesis of lumbar region    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   Wears glasses      Past Surgical History:  Procedure Laterality Date   BACK  SURGERY     Dr Lovell Sheehan- spondylosis   BILATERAL CARPAL TUNNEL RELEASE  1994   BILATERAL ELBOW SURG.  1999   BILATERAL SALPINGOOPHECTOMY  1993   POST-OP URETER REPAIR 12 DAYS AFTER    CATARACT EXTRACTION, BILATERAL     COLONOSCOPY     EYE SURGERY     FOOT ARTHRODESIS Left 07/10/2016   Procedure: LEFT 2ND TARSAL METATARSAL ARTHRODESIS  GASTROC RECESSION LEFT LAPIDUS MODIFIED MCBRIDE BUIONECTOMY;  Surgeon: Toni Arthurs, MD;  Location: [facility];  Service: Orthopedics;  Laterality: Left;   GASTROC RECESSION EXTREMITY Left 07/10/2016   Procedure: GASTROC RECESSION EXTREMITY;  Surgeon: Toni Arthurs, MD;  Location: [facility];  Service: Orthopedics;  Laterality: Left;   HARDWARE REMOVAL Left 05/20/2018   Procedure: Left foot removal of deep implants;  Surgeon: Toni Arthurs, MD;  Location: [facility];  Service: Orthopedics;  Laterality: Left;    LEFT THUMB JOINT REPLACEMENT  2005   RIGHT DONE IN 2004   OOPHORECTOMY     POLYPECTOMY     RIGHT KNEE ARTHROSCOPY  X2  BEFORE 2011   RIGHT KNEE ARTHROSCOPY/ PARTIAL LATERAL MENISECTOMY/ TRICOMPARTMENT CHONDROPLASTY/ DECOMPRESSION CYST  10-26-2009   RIGHT KNEE CLOSED MANIPULATION  09-11-2010   RIGHT SHOULDER ARTHROSCOPY  2010  &  2004   TIBIA CYST REMOVED AND ORIF LEG FX  1958   TONSILLECTOMY  1968   TOTAL KNEE ARTHROPLASTY  07-16-2010   RIGHT   TOTAL THYROIDECTOMY  2009   CANCER  (POST-OP BLEED)  AND RADIATION [place]   trigger fingers Right 2013   3rd and 4th fingers   UPPER GASTROINTESTINAL ENDOSCOPY     VAGINAL HYSTERECTOMY  1990     Social History   Tobacco Use  Smoking Status Never  Smokeless Tobacco Never    Social History   Substance and Sexual Activity  Alcohol Use Yes   Comment: RARE     Family History  Problem Relation Age of Onset   Colon polyps Sister 49   Alzheimer's disease Mother    Heart disease Father    Cirrhosis Father    Heart attack Father    Macular  degeneration Father    Colon cancer Neg Hx       Current Outpatient Medications:    blood glucose meter kit and supplies KIT, Test blood sugar once daily. Dx code: E11.9, Disp: 1 each, Rfl: 0   Blood Glucose Monitoring Suppl (FREESTYLE LITE) DEVI, Check blood sugar no more than twice daily, Disp: 1 each, Rfl: 0   Blood Glucose Monitoring Suppl (ONE TOUCH ULTRA 2) w/Device KIT, Dispense one meter and test strips, lancets- 100 of each with PRN RF, Disp: 1 kit, Rfl: 0   famotidine (PEPCID) 40 MG tablet, 40 mg by oral route., Disp: , Rfl:    FARXIGA 10 MG TABS tablet, TAKE 1 TABLET(10 MG) BY MOUTH DAILY, Disp: 90 tablet, Rfl: 1   Fexofenadine HCl (ALLEGRA PO), Take by mouth., Disp: , Rfl:    fluconazole (DIFLUCAN) 150 MG tablet, TAKE 1 TABLET BY MOUTH ONCE FOR 1 DOSE. CAN REPEAT WEEKLY AS NEEDED, Disp: 5 tablet, Rfl: 0   glucose blood (FREESTYLE LITE) test strip, CHECK BLOOD SUGARS NO MORE THAN TWICE DAILY, Disp: 100 each, Rfl: 12   Lancets (FREESTYLE) lancets, Check blood sugars no more than twice daily, Disp: 100 each, Rfl: 12   levothyroxine (SYNTHROID) 125 MCG tablet, TAKE 1 TABLET(125 MCG) BY MOUTH DAILY BEFORE AND BREAKFAST, Disp: 90 tablet, Rfl: 3   losartan (COZAAR) 50 MG tablet, TAKE 1 TABLET BY MOUTH DAILY, Disp: 90 tablet, Rfl: 1   Magnesium 400 MG CAPS, Take by mouth., Disp: , Rfl:    metaxalone (SKELAXIN) 800 MG tablet, Take 1 tablet (800 mg total) by mouth 3 (three) times daily as needed for muscle spasms., Disp: 30 tablet, Rfl: 1   metoprolol succinate (TOPROL-XL) 50 MG 24 hr tablet, Take 1 tablet (50 mg total) by mouth daily., Disp: 90 tablet, Rfl: 1   montelukast (SINGULAIR) 10 MG tablet, Take 1 tablet (10 mg total) by mouth at bedtime., Disp: 90 tablet, Rfl: 1   ondansetron (ZOFRAN) 8 MG tablet, Take 1 tablet (8 mg total) by mouth every 8 (eight) hours as needed for nausea or vomiting., Disp: 30 tablet, Rfl: 1   Oxycodone HCl 10 MG TABS, Take 1 tablet (10 mg total) by mouth in  the morning and at bedtime., Disp: 30 tablet, Rfl: 0   Oxycodone HCl 10 MG TABS, Take 1 tablet (10 mg total) by mouth every 8 (eight) hours as needed., Disp: 50 tablet, Rfl: 0   phenazopyridine (PYRIDIUM) 200 MG tablet, Take 1 tablet (200 mg total) by mouth 3 (three) times daily as needed for pain. For UTI, Disp: 30 tablet, Rfl:  1   psyllium (REGULOID) 0.52 g capsule, Take 0.52 g by mouth daily., Disp: , Rfl:    REPATHA SURECLICK 140 MG/ML SOAJ, ADMINISTER 1 ML UNDER THE SKIN EVERY 14 DAYS, Disp: 6 mL, Rfl: 1   Riboflavin (VITAMIN B2 PO), Take 400 mg by mouth., Disp: , Rfl:    Rimegepant Sulfate (NURTEC) 75 MG TBDP, Use one daily as needed for migraine headache, Disp: 16 tablet, Rfl: 5   Semaglutide, 1 MG/DOSE, 4 MG/3ML SOPN, Inject 1 mg as directed once a week., Disp: 9 mL, Rfl: 2   Topiramate ER (TROKENDI XR) 100 MG CP24, TAKE 1 CAPSULE BY MOUTH DAILY, Disp: 90 capsule, Rfl: 3   traZODone (DESYREL) 100 MG tablet, Take 1.5 tablets (150 mg total) by mouth at bedtime., Disp: 150 tablet, Rfl: 1   zolpidem (AMBIEN) 5 MG tablet, TAKE 1 TABLET(5 MG) BY MOUTH AT BEDTIME AS NEEDED FOR SLEEP, Disp: 90 tablet, Rfl: 1   Cardiovascular and other pertinent studies:  Reviewed external labs and tests, independently interpreted  EKG 11/06/2022: Sinus rhythm 68 bpm  Incomplete right bundle branch block Nonspecific T-abnormality  CT cardiac scoring 10/21/2022: 1. Bilateral breast nodules. Correlation with mammography recommended. 2. Hepatic hypodensity consistent with a tiny cysts. 3. Otherwise no acute extracardiac incidental findings identified.  LM: 237 Left anterior descending artery: 145 Left circumflex artery: 255 Right coronary artery: 1.12  Total: 638 Percentile: 92   Recent labs: 08/07/2022: Glucose 133, BUN/Cr 16/0.73. EGFR 81. Na/K 139/3.6. Rest of the CMP normal H/H 15/45. MCV 86. Platelets 193 HbA1C 6.9% Chol 136, TG 138, HDL 56, LDL 52 TSH 1.3 normal   Review of Systems   Cardiovascular:  Negative for chest pain, dyspnea on exertion, leg swelling, palpitations and syncope.         Vitals:   11/06/22 1000  BP: 102/60  Pulse: 70  Resp: 17  SpO2: 97%     Body mass index is 24.78 kg/m. Filed Weights   11/06/22 1000  Weight: 163 lb (73.9 kg)     Objective:   Physical Exam Vitals and nursing note reviewed.  Constitutional:      General: She is not in acute distress. Neck:     Vascular: No JVD.  Cardiovascular:     Rate and Rhythm: Normal rate and regular rhythm.     Heart sounds: Normal heart sounds. No murmur heard. Pulmonary:     Effort: Pulmonary effort is normal.     Breath sounds: Normal breath sounds. No wheezing or rales.  Musculoskeletal:     Right lower leg: No edema.     Left lower leg: No edema.          Visit diagnoses:   ICD-10-CM   1. [place]atston coronary artery calcium score greater than 400  R93.1 EKG 12-Lead    PCV MYOCARDIAL PERFUSION WITH LEXISCAN    2. 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    Dispense:  90 tablet    Refill:  1     Assessment & Recommendations:    72 y.o. Caucasian female with type 2 DM, controlled hyperlipidemia, elevated CAC  Elevated CAC: 92nd percentile, including calcium 237 in left main. Known statin myopathy. Continue Repatha. Given  diabetes and elevated CAC, recommend Lexiscan nuclear stress test (unable to walk uphill due to back pain).  Hypertension: Blood pressure low normal since intentional weight loss. Reduce losartan to 25 mg daily.   Further recommendations after above testing  Thank you for referring the patient to us. Please feel free to contact with any questions.   Elder NegusManish J Zoe Goonan, MD Pager: [phone] Office: [phone]

## 2022-11-07 ENCOUNTER — Telehealth: Payer: Self-pay

## 2022-11-07 NOTE — Telephone Encounter (Signed)
PA initiated via Covermymeds; KEY: [id number]. Awaiting determination.

## 2022-11-07 NOTE — Telephone Encounter (Signed)
PA approved.   Approved. Authorization Expiration Date: 11/07/2023

## 2022-11-10 ENCOUNTER — Encounter: Payer: Self-pay | Admitting: Family Medicine

## 2022-11-17 ENCOUNTER — Ambulatory Visit: Payer: Medicare Other

## 2022-11-17 DIAGNOSIS — R931 Abnormal findings on diagnostic imaging of heart and coronary circulation: Secondary | ICD-10-CM

## 2022-11-20 LAB — HM MAMMOGRAPHY

## 2022-11-20 LAB — HM DEXA SCAN

## 2022-11-25 ENCOUNTER — Encounter: Payer: Self-pay | Admitting: Cardiology

## 2022-11-25 ENCOUNTER — Encounter: Payer: Self-pay | Admitting: Family Medicine

## 2022-12-03 ENCOUNTER — Encounter: Payer: Self-pay | Admitting: Cardiology

## 2022-12-03 NOTE — Telephone Encounter (Signed)
From patient.

## 2022-12-16 ENCOUNTER — Encounter: Payer: Self-pay | Admitting: Urology

## 2022-12-16 ENCOUNTER — Ambulatory Visit: Payer: Medicare Other | Admitting: Urology

## 2022-12-16 VITALS — BP 113/75 | HR 74 | Ht 68.0 in | Wt 161.0 lb

## 2022-12-16 DIAGNOSIS — Z09 Encounter for follow-up examination after completed treatment for conditions other than malignant neoplasm: Secondary | ICD-10-CM

## 2022-12-16 DIAGNOSIS — Z87440 Personal history of urinary (tract) infections: Secondary | ICD-10-CM | POA: Diagnosis not present

## 2022-12-16 DIAGNOSIS — N390 Urinary tract infection, site not specified: Secondary | ICD-10-CM

## 2022-12-16 LAB — URINALYSIS, ROUTINE W REFLEX MICROSCOPIC
Bilirubin, UA: NEGATIVE
Ketones, UA: NEGATIVE
Leukocytes,UA: NEGATIVE
Nitrite, UA: NEGATIVE
Protein,UA: NEGATIVE
RBC, UA: NEGATIVE
Specific Gravity, UA: 1.015 (ref 1.005–1.030)
Urobilinogen, Ur: 0.2 mg/dL (ref 0.2–1.0)
pH, UA: 7 (ref 5.0–7.5)

## 2022-12-16 NOTE — Progress Notes (Signed)
Assessment: 1. Frequent UTI     Plan: Continue methods to reduce the risk of UTI's including increased fluid intake, timed and double voiding, daily cranberry supplement, daily probiotics, and vaginal hormone replacement. Continue vaginal hormone cream 2-3 times per week Continue daily cranberry supplement and daily probiotic. Prescription for cefdinir 300 mg twice daily x 7 days provided for as needed use. Recommend obtaining a urine culture prior to treatment with antibiotics. Return to office in 2 months  Chief Complaint:  Chief Complaint  Patient presents with   Frequent UTI    History of Present Illness:  Debbie Hayes is a 72 y.o. female who is seen for further evaluation of frequent UTI's. She has a long history of frequent UTIs and has previously been evaluated by Dr. Vernie Ammons at [facility].  She was last seen there in 11/19.  She reports frequent UTIs for approximately 25 years.  Typical symptoms include urgency, bladder pain, and slight dysuria.  She does not have fevers, chills, flank pain, or gross hematuria. She was being managed with Macrodantin 50 mg nightly.  She was previously evaluated in [place] with ultrasound, CT, and cystoscopy which were unremarkable.  Urine culture results: 6/22 >100K Klebsiella 7/22 >100K Enterococcus 10/22 >100K E. Coli 7/23 >100K Klebsiella 9/23 >100K Klebsiella 9/23 No growth 10/23 >100K Klebsiella 2/24 50-100K Enterococcus 2/24 No growth 3/24 Mixed flora  No history of breast or GYN malignancy.  She returns today for follow-up.  She has not had any recent UTI symptoms.  She is currently on daily cranberry supplement and a daily probiotic.  She is using vaginal hormone cream 2 times per week.  No dysuria or gross hematuria.  She is currently very pleased with her situation.  Portions of the above documentation were copied from a prior visit for review purposes only.  Past Medical History:  Past Medical  History:  Diagnosis Date   Allergy    Cancer (HCC)    thyroid cancer   Cataract    Chronic migraine BOTOX INJECTION EVERY 3 MONTHS   Chronic pain in right shoulder    Constipation    Diabetes mellitus    Disorder of inner ear CAUSES VERTIGO OCCASIONALLY   Fibromyalgia    GERD (gastroesophageal reflux disease)    Hemorrhoid    History of bladder infections    History of thyroid cancer 2009  S/P TOTAL THYROIDECTOMY AND RADIATION   Hyperlipidemia    Hypertension CARDIOLOGIST- DR Donnie Aho- WILL REQUEST LATE NOTE   DENIES S & S   Hypothyroidism    Insomnia    Left shoulder pain    Macular degeneration    MRSA infection    nasal treated more than 15 years ago   Normal nuclear stress test 01-25-2009   OA (osteoarthritis) JOINTS   Osteopenia    Painful orthopaedic hardware (HCC)    left foot   PONV (postoperative nausea and vomiting) SEVERE   Rotator cuff disorder LEFT SHOULDER RTC IMPINGMENT   Sleep apnea    mouth guard, no cpap    Sleep apnea in adult 06/26/2017   Spondylolisthesis of lumbar region    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   Wears glasses     Past Surgical History:  Past Surgical History:  Procedure Laterality Date   BACK SURGERY     Dr Lovell Sheehan- spondylosis   BILATERAL CARPAL TUNNEL RELEASE  1994   BILATERAL ELBOW SURG.  1999   BILATERAL SALPINGOOPHECTOMY  1993   POST-OP URETER  REPAIR 12 DAYS AFTER    CATARACT EXTRACTION, BILATERAL     COLONOSCOPY     EYE SURGERY     FOOT ARTHRODESIS Left 07/10/2016   Procedure: LEFT 2ND TARSAL METATARSAL ARTHRODESIS  GASTROC RECESSION LEFT LAPIDUS MODIFIED MCBRIDE BUIONECTOMY;  Surgeon: Toni Arthurs, MD;  Location: [facility];  Service: Orthopedics;  Laterality: Left;   GASTROC RECESSION EXTREMITY Left 07/10/2016   Procedure: GASTROC RECESSION EXTREMITY;  Surgeon: Toni Arthurs, MD;  Location: [facility];  Service: Orthopedics;  Laterality: Left;   HARDWARE REMOVAL Left 05/20/2018    Procedure: Left foot removal of deep implants;  Surgeon: Toni Arthurs, MD;  Location: [facility];  Service: Orthopedics;  Laterality: Left;    LEFT THUMB JOINT REPLACEMENT  2005   RIGHT DONE IN 2004   OOPHORECTOMY     POLYPECTOMY     RIGHT KNEE ARTHROSCOPY  X2  BEFORE 2011   RIGHT KNEE ARTHROSCOPY/ PARTIAL LATERAL MENISECTOMY/ TRICOMPARTMENT CHONDROPLASTY/ DECOMPRESSION CYST  10-26-2009   RIGHT KNEE CLOSED MANIPULATION  09-11-2010   RIGHT SHOULDER ARTHROSCOPY  2010  &  2004   TIBIA CYST REMOVED AND ORIF LEG FX  1958   TONSILLECTOMY  1968   TOTAL KNEE ARTHROPLASTY  07-16-2010   RIGHT   TOTAL THYROIDECTOMY  2009   CANCER  (POST-OP BLEED)  AND RADIATION TX   trigger fingers Right 2013   3rd and 4th fingers   UPPER GASTROINTESTINAL ENDOSCOPY     VAGINAL HYSTERECTOMY  1990    Allergies:  Allergies  Allergen Reactions   Sulfa Antibiotics Hives   Statins Other (See Comments)    Pt has tried multiple and cannot tolerate    Family History:  Family History  Problem Relation Age of Onset   Colon polyps Sister 84   Alzheimer's disease Mother    Heart disease Father    Cirrhosis Father    Heart attack Father    Macular degeneration Father    Colon cancer Neg Hx     Social History:  Social History   Tobacco Use   Smoking status: Never   Smokeless tobacco: Never  Vaping Use   Vaping Use: Never used  Substance Use Topics   Alcohol use: Yes    Comment: RARE   Drug use: No    ROS: Constitutional:  Negative for fever, chills, weight loss CV: Negative for chest pain, previous MI, hypertension Respiratory:  Negative for shortness of breath, wheezing, sleep apnea, frequent cough GI:  Negative for nausea, vomiting, bloody stool, GERD  Physical exam: BP 113/75   Pulse 74   Ht 5\' 8"  (1.727 m)   Wt 161 lb (73 kg)   BMI 24.48 kg/m  GENERAL APPEARANCE:  Well appearing, well developed, well nourished, NAD HEENT:  Atraumatic, normocephalic, oropharynx  clear NECK:  Supple without lymphadenopathy or thyromegaly ABDOMEN:  Soft, non-tender, no masses EXTREMITIES:  Moves all extremities well, without clubbing, cyanosis, or edema NEUROLOGIC:  Alert and oriented x 3, normal gait, CN II-XII grossly intact MENTAL STATUS:  appropriate BACK:  Non-tender to palpation, No CVAT SKIN:  Warm, dry, and intact  Results: U/A: negative

## 2023-01-02 ENCOUNTER — Other Ambulatory Visit: Payer: Self-pay | Admitting: Family Medicine

## 2023-01-02 DIAGNOSIS — G4700 Insomnia, unspecified: Secondary | ICD-10-CM

## 2023-01-19 ENCOUNTER — Ambulatory Visit (INDEPENDENT_AMBULATORY_CARE_PROVIDER_SITE_OTHER): Payer: Medicare Other | Admitting: *Deleted

## 2023-01-19 DIAGNOSIS — Z0000 Encounter for general adult medical examination without abnormal findings: Secondary | ICD-10-CM | POA: Diagnosis not present

## 2023-01-19 NOTE — Progress Notes (Signed)
Subjective:   Debbie Hayes is a 72 y.o. female who presents for Medicare Annual (Subsequent) preventive examination.  Visit Complete: Virtual  I connected with  [name] on 01/19/23 by a audio enabled telemedicine application and verified that I am speaking with the correct person using two identifiers.  Patient Location: Home  Provider Location: Office/Clinic  I discussed the limitations of evaluation and management by telemedicine. The patient expressed understanding and agreed to proceed.   Review of Systems     Cardiac Risk Factors include: advanced age (>91men, >58 women);dyslipidemia;diabetes mellitus     Objective:    Today's Vitals   There is no height or weight on file to calculate BMI.     01/19/2023   10:59 AM 01/15/2022   11:05 AM 04/28/2019   11:22 AM 05/20/2018   10:05 AM 05/12/2018    3:59 PM 05/10/2018    2:34 PM 12/04/2017    7:33 AM  Advanced Directives  Does Patient Have a Medical Advance Directive? Yes Yes Yes Yes No No Yes  Type of Estate agent of [place];Living will Healthcare Power of [place];Out of facility DNR (pink MOST or yellow form);Living will Healthcare Power of [place];Living will Healthcare Power of [place];Living will   Healthcare Power of [place];Living will  Does patient want to make changes to medical advance directive?  No - Patient declined  No - Patient declined     Copy of Healthcare Power of Attorney in Chart? No - copy requested No - copy requested No - copy requested No - copy requested     Would patient like information on creating a medical advance directive?     No - Patient declined No - Patient declined     Current Medications (verified) Outpatient Encounter Medications as of 01/19/2023  Medication Sig   acetaminophen (TYLENOL) 500 MG tablet Take 500 mg by mouth every 6 (six) hours as needed.   aspirin EC 81 MG tablet Take 81 mg by mouth daily. Swallow whole.   blood glucose meter  kit and supplies KIT Test blood sugar once daily. Dx code: E11.9   Blood Glucose Monitoring Suppl (FREESTYLE LITE) DEVI Check blood sugar no more than twice daily   Blood Glucose Monitoring Suppl (ONE TOUCH ULTRA 2) w/Device KIT Dispense one meter and test strips, lancets- 100 of each with PRN RF   Cranberry 125 MG TABS Take by mouth.   FARXIGA 10 MG TABS tablet TAKE 1 TABLET(10 MG) BY MOUTH DAILY   Fexofenadine HCl (ALLEGRA PO) Take by mouth.   glucose blood (FREESTYLE LITE) test strip CHECK BLOOD SUGARS NO MORE THAN TWICE DAILY   lactobacillus acidophilus (BACID) TABS tablet Take 2 tablets by mouth 3 (three) times daily.   Lancets (FREESTYLE) lancets Check blood sugars no more than twice daily   levothyroxine (SYNTHROID) 125 MCG tablet TAKE 1 TABLET(125 MCG) BY MOUTH DAILY BEFORE AND BREAKFAST   losartan (COZAAR) 25 MG tablet Take 1 tablet (25 mg total) by mouth daily.   Magnesium 400 MG CAPS Take by mouth.   metoprolol succinate (TOPROL-XL) 50 MG 24 hr tablet TAKE 1 TABLET(50 MG) BY MOUTH DAILY   montelukast (SINGULAIR) 10 MG tablet Take 1 tablet (10 mg total) by mouth at bedtime.   Oxycodone HCl 10 MG TABS Take 1 tablet (10 mg total) by mouth in the morning and at bedtime.   Oxycodone HCl 10 MG TABS Take 1 tablet (10 mg total) by mouth every 8 (eight) hours as  needed.   psyllium (METAMUCIL) 58.6 % powder Take 1 packet by mouth 3 (three) times daily.   REPATHA SURECLICK 140 MG/ML SOAJ ADMINISTER 1 ML UNDER THE SKIN EVERY 14 DAYS   Rimegepant Sulfate (NURTEC) 75 MG TBDP Use one daily as needed for migraine headache   Semaglutide, 1 MG/DOSE, 4 MG/3ML SOPN Inject 1 mg as directed once a week.   Topiramate ER (TROKENDI XR) 100 MG CP24 TAKE 1 CAPSULE BY MOUTH DAILY   traZODone (DESYREL) 100 MG tablet TAKE 2 AND 1/2 TABLETS(250 MG) BY MOUTH AT BEDTIME   zolpidem (AMBIEN) 5 MG tablet TAKE 1 TABLET(5 MG) BY MOUTH AT BEDTIME AS NEEDED FOR SLEEP   No facility-administered encounter medications on  file as of 01/19/2023.    Allergies (verified) Sulfa antibiotics and Statins   History: Past Medical History:  Diagnosis Date   Allergy    Cancer (HCC)    thyroid cancer   Cataract    Chronic migraine BOTOX INJECTION EVERY 3 MONTHS   Chronic pain in right shoulder    Constipation    Diabetes mellitus    Disorder of inner ear CAUSES VERTIGO OCCASIONALLY   Fibromyalgia    GERD (gastroesophageal reflux disease)    Hemorrhoid    History of bladder infections    History of thyroid cancer 2009  S/P TOTAL THYROIDECTOMY AND RADIATION   Hyperlipidemia    Hypertension CARDIOLOGIST- DR Donnie Aho- WILL REQUEST LATE NOTE   DENIES S & S   Hypothyroidism    Insomnia    Left shoulder pain    Macular degeneration    MRSA infection    nasal treated more than 15 years ago   Normal nuclear stress test 01-25-2009   OA (osteoarthritis) JOINTS   Osteopenia    Painful orthopaedic hardware (HCC)    left foot   PONV (postoperative nausea and vomiting) SEVERE   Rotator cuff disorder LEFT SHOULDER RTC IMPINGMENT   Sleep apnea    mouth guard, no cpap    Sleep apnea in adult 06/26/2017   Spondylolisthesis of lumbar region    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   Wears glasses    Past Surgical History:  Procedure Laterality Date   BACK SURGERY     Dr Lovell Sheehan- spondylosis   BILATERAL CARPAL TUNNEL RELEASE  1994   BILATERAL ELBOW SURG.  1999   BILATERAL SALPINGOOPHECTOMY  1993   POST-OP URETER REPAIR 12 DAYS AFTER    CATARACT EXTRACTION, BILATERAL     COLONOSCOPY     EYE SURGERY     FOOT ARTHRODESIS Left 07/10/2016   Procedure: LEFT 2ND TARSAL METATARSAL ARTHRODESIS  GASTROC RECESSION LEFT LAPIDUS MODIFIED MCBRIDE BUIONECTOMY;  Surgeon: Toni Arthurs, MD;  Location: [facility];  Service: Orthopedics;  Laterality: Left;   GASTROC RECESSION EXTREMITY Left 07/10/2016   Procedure: GASTROC RECESSION EXTREMITY;  Surgeon: Toni Arthurs, MD;  Location: [facility];  Service:  Orthopedics;  Laterality: Left;   HARDWARE REMOVAL Left 05/20/2018   Procedure: Left foot removal of deep implants;  Surgeon: Toni Arthurs, MD;  Location: [facility];  Service: Orthopedics;  Laterality: Left;    LEFT THUMB JOINT REPLACEMENT  2005   RIGHT DONE IN 2004   OOPHORECTOMY     POLYPECTOMY     RIGHT KNEE ARTHROSCOPY  X2  BEFORE 2011   RIGHT KNEE ARTHROSCOPY/ PARTIAL LATERAL MENISECTOMY/ TRICOMPARTMENT CHONDROPLASTY/ DECOMPRESSION CYST  10-26-2009   RIGHT KNEE CLOSED MANIPULATION  09-11-2010   RIGHT  SHOULDER ARTHROSCOPY  2010  &  2004   TIBIA CYST REMOVED AND ORIF LEG FX  1958   TONSILLECTOMY  1968   TOTAL KNEE ARTHROPLASTY  07-16-2010   RIGHT   TOTAL THYROIDECTOMY  2009   CANCER  (POST-OP BLEED)  AND RADIATION [place]   trigger fingers Right 2013   3rd and 4th fingers   UPPER GASTROINTESTINAL ENDOSCOPY     VAGINAL HYSTERECTOMY  1990   Family History  Problem Relation Age of Onset   Colon polyps Sister 58   Alzheimer's disease Mother    Heart disease Father    Cirrhosis Father    Heart attack Father    Macular degeneration Father    Colon cancer Neg Hx    Social History   Socioeconomic History   Marital status: Married    Spouse name: Not on file   Number of children: 4   Years of education: Not on file   Highest education level: Not on file  Occupational History    Comment: retired Charity fundraiser  Tobacco Use   Smoking status: Never   Smokeless tobacco: Never  Vaping Use   Vaping Use: Never used  Substance and Sexual Activity   Alcohol use: Yes    Comment: RARE   Drug use: No   Sexual activity: Not on file  Other Topics Concern   Not on file  Social History Narrative   Lives with husband Dr Janace Hoard.   They have 2 adult children that live out of state.   Social Determinants of Health   Financial Resource Strain: Low Risk  (01/19/2023)   Overall Financial Resource Strain (CARDIA)    Difficulty of Paying Living Expenses: Not hard at all   Food Insecurity: No Food Insecurity (01/19/2023)   Hunger Vital Sign    Worried About Running Out of Food in the Last Year: Never true    Ran Out of Food in the Last Year: Never true  Transportation Needs: No Transportation Needs (01/19/2023)   PRAPARE - Administrator, Civil Service (Medical): No    Lack of Transportation (Non-Medical): No  Physical Activity: Inactive (01/19/2023)   Exercise Vital Sign    Days of Exercise per Week: 0 days    Minutes of Exercise per Session: 0 min  Stress: No Stress Concern Present (01/15/2022)   [employer] of Occupational Health - Occupational Stress Questionnaire    Feeling of Stress : Not at all  Social Connections: Socially Integrated (01/15/2022)   Social Connection and Isolation Panel [NHANES]    Frequency of Communication with Friends and Family: More than three times a week    Frequency of Social Gatherings with Friends and Family: More than three times a week    Attends Religious Services: More than 4 times per year    Active Member of [employer] or Organizations: Yes    Attends Engineer, structural: More than 4 times per year    Marital Status: Married    Tobacco Counseling Counseling given: Not Answered   Clinical Intake:  Pre-visit preparation completed: Yes  Pain : No/denies pain  Nutritional Risks: None Diabetes: Yes CBG done?: No Did pt. bring in CBG monitor from home?: No  How often do you need to have someone help you when you read instructions, pamphlets, or other written materials from your doctor or pharmacy?: 1 - Never  Interpreter Needed?: No  Information entered by :: [employer]   Activities of Daily Living  01/19/2023   11:00 AM  In your present state of health, do you have any difficulty performing the following activities:  Hearing? 1  Comment some slight hearing loss  Vision? 0  Difficulty concentrating or making decisions? 0  Walking or climbing stairs? 0  Dressing or  bathing? 0  Doing errands, shopping? 0  Preparing Food and eating ? N  Using the Toilet? N  In the past six months, have you accidently leaked urine? Y  Do you have problems with loss of bowel control? N  Managing your Medications? N  Managing your Finances? N  Housekeeping or managing your Housekeeping? N    Patient Care Team: Copland, Gwenlyn Found, MD as PCP - General (Family Medicine) Pyrtle, Carie Caddy, MD as Consulting Physician (Gastroenterology) Dorisann Frames, MD as Consulting Physician (Endocrinology) Ihor Gully, MD (Inactive) as Consulting Physician (Urology) Monica Becton, MD as Consulting Physician (Sports Medicine)  Indicate any recent Medical Services you may have received from other than [facility] providers in the past year (date may be approximate).     Assessment:   This is a routine wellness examination for [place].  Hearing/Vision screen No results found.  Dietary issues and exercise activities discussed:     Goals Addressed   None    Depression Screen    01/19/2023   11:06 AM 01/15/2022   11:09 AM 11/14/2021   11:13 AM 02/02/2017    3:37 PM 03/05/2016   12:19 PM 01/16/2016   11:12 AM 10/22/2015    1:49 PM  PHQ 2/9 Scores  PHQ - 2 Score 0 0 0 0 0 0 0    Fall Risk    01/19/2023   11:00 AM 01/15/2022   11:08 AM 11/14/2021   11:13 AM 02/02/2017    3:37 PM 03/05/2016   12:19 PM  Fall Risk   Falls in the past year? 0 0 0 No No  Number falls in past yr: 0 0 0    Injury with Fall? 0 0 0    Risk for fall due to : No Fall Risks No Fall Risks     Follow up Falls evaluation completed Falls evaluation completed       MEDICARE RISK AT HOME:  Medicare Risk at Home - 01/19/23 1103     Any stairs in or around the home? Yes    If so, are there any without handrails? No    Home free of loose throw rugs in walkways, pet beds, electrical cords, etc? Yes    Adequate lighting in your home to reduce risk of falls? Yes    Life alert? No    Use of a cane, walker or  w/c? No    Grab bars in the bathroom? No    Shower chair or bench in shower? Yes    Elevated toilet seat or a handicapped toilet? No             TIMED UP AND GO:  Was the test performed?  No    Cognitive Function:        01/19/2023   11:07 AM 01/15/2022   11:18 AM  6CIT Screen  What Year? 0 points 0 points  What month? 0 points 0 points  What time? 0 points 0 points  Count back from 20 0 points 0 points  Months in reverse 0 points 0 points  Repeat phrase 0 points 0 points  Total Score 0 points 0 points    Immunizations Immunization History  Administered  Date(s) Administered   Fluad Quad(high Dose 65+) 05/02/2020   Influenza Split 04/27/2012   Influenza, High Dose Seasonal PF 05/07/2016, 04/27/2017   Influenza,inj,Quad PF,6+ Mos 05/25/2013, 04/19/2014, 05/01/2015   Influenza,inj,quad, With Preservative 03/28/2017   Influenza-Unspecified 04/11/2016, 03/28/2019   PFIZER(Purple Top)SARS-COV-2 Vaccination 09/03/2019, 09/28/2019, 07/23/2020   Pneumococcal Conjugate-13 10/31/2014   Pneumococcal Polysaccharide-23 05/01/2015   Tdap 03/10/2013   Zoster, Live 07/29/2011    TDAP status: Up to date  Flu Vaccine status: Up to date  Pneumococcal vaccine status: Up to date  Covid-19 vaccine status: Information provided on how to obtain vaccines.   Qualifies for Shingles Vaccine? Yes   Zostavax completed Yes   Shingrix Completed?: No.    Education has been provided regarding the importance of this vaccine. Patient has been advised to call insurance company to determine out of pocket expense if they have not yet received this vaccine. Advised may also receive vaccine at local pharmacy or Health Dept. Verbalized acceptance and understanding.  Screening Tests Health Maintenance  Topic Date Due   Zoster Vaccines- Shingrix (1 of 2) Never done   OPHTHALMOLOGY EXAM  12/22/2020   COVID-19 Vaccine (4 - 2023-24 season) 03/28/2022   FOOT EXAM  11/15/2022   Colonoscopy  12/05/2022    HEMOGLOBIN A1C  02/05/2023   INFLUENZA VACCINE  02/26/2023   DTaP/Tdap/Td (2 - Td or Tdap) 03/11/2023   Diabetic kidney evaluation - eGFR measurement  08/08/2023   Diabetic kidney evaluation - Urine ACR  08/08/2023   Medicare Annual Wellness (AWV)  01/19/2024   MAMMOGRAM  11/19/2024   Pneumonia Vaccine 8+ Years old  Completed   DEXA SCAN  Completed   Hepatitis C Screening  Completed   HPV VACCINES  Aged Out    Health Maintenance  Health Maintenance Due  Topic Date Due   Zoster Vaccines- Shingrix (1 of 2) Never done   OPHTHALMOLOGY EXAM  12/22/2020   COVID-19 Vaccine (4 - 2023-24 season) 03/28/2022   FOOT EXAM  11/15/2022   Colonoscopy  12/05/2022    Colorectal cancer screening: Type of screening: Colonoscopy. Completed 12/04/17. Repeat every 5 years  Mammogram status: Completed 11/20/22. Repeat every year  Bone Density status: Completed 11/20/22. Results reflect: Bone density results: OSTEOPOROSIS. Repeat every 2 years.  Lung Cancer Screening: (Low Dose CT Chest recommended if Age 37-80 years, 20 pack-year currently smoking OR have quit w/in 15years.) does not qualify.   Additional Screening:  Hepatitis C Screening: does qualify; Completed 03/05/16  Vision Screening: Recommended annual ophthalmology exams for early detection of glaucoma and other disorders of the eye. Is the patient up to date with their annual eye exam?  Yes  Who is the provider or what is the name of the office in which the patient attends annual eye exams? Dr. Heather [place] - [facility] If pt is not established with a provider, would they like to be referred to a provider to establish care? No .   Dental Screening: Recommended annual dental exams for proper oral hygiene  Diabetic Foot Exam: Diabetic Foot Exam: Overdue, Pt has been advised about the importance in completing this exam. Pt is scheduled for diabetic foot exam on N/a.  Community Resource Referral / Chronic Care Management: CRR required  this visit?  No   CCM required this visit?  No     Plan:     I have personally reviewed and noted the following in the patient's chart:   Medical and social history Use of alcohol, tobacco or  illicit drugs  Current medications and supplements including opioid prescriptions. Patient is currently taking opioid prescriptions. Information provided to patient regarding non-opioid alternatives. Patient advised to discuss non-opioid treatment plan with their provider. Functional ability and status Nutritional status Physical activity Advanced directives List of other physicians Hospitalizations, surgeries, and ER visits in previous 12 months Vitals Screenings to include cognitive, depression, and falls Referrals and appointments  In addition, I have reviewed and discussed with patient certain preventive protocols, quality metrics, and best practice recommendations. A written personalized care plan for preventive services as well as general preventive health recommendations were provided to patient.     Donne Anon, CMA   01/19/2023   After Visit Summary: (MyChart) Due to this being a telephonic visit, the after visit summary with patients personalized plan was offered to patient via MyChart   Nurse Notes: None

## 2023-01-22 ENCOUNTER — Other Ambulatory Visit (INDEPENDENT_AMBULATORY_CARE_PROVIDER_SITE_OTHER): Payer: Medicare Other

## 2023-01-22 ENCOUNTER — Ambulatory Visit (INDEPENDENT_AMBULATORY_CARE_PROVIDER_SITE_OTHER): Payer: Medicare Other | Admitting: Sports Medicine

## 2023-01-22 ENCOUNTER — Encounter: Payer: Self-pay | Admitting: Family Medicine

## 2023-01-22 DIAGNOSIS — M65311 Trigger thumb, right thumb: Secondary | ICD-10-CM | POA: Diagnosis not present

## 2023-01-22 NOTE — Assessment & Plan Note (Signed)
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 She does have tenderness but without triggering. On ultrasound she does have a visible ganglion adjacent to the FPL, I injected medication into the FPL as well as into the ganglion cyst, return to see me as needed.

## 2023-01-22 NOTE — Progress Notes (Signed)
    Procedures performed today:    Procedure: Real-time Ultrasound Guided injection of the right flexor pollicis longus tendon sheath Device: Samsung HS60  Verbal informed consent obtained.  Time-out conducted.  Noted no overlying erythema, induration, or other signs of local infection.  Skin prepped in a sterile fashion.  Local anesthesia: Topical Ethyl chloride.  With sterile technique and under real time ultrasound guidance: Noted ganglion adjacent to FPL sheath, I injected 1/2 cc lidocaine, 1 cc kenalog 40.   Completed without difficulty  Advised to call if fevers/chills, erythema, induration, drainage, or persistent bleeding.  Images permanently stored and available for review in PACS.  Impression: Technically successful ultrasound guided injection.  Independent interpretation of notes and tests performed by another provider:   None.  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 She does have tenderness but without triggering. On ultrasound she does have a visible ganglion adjacent to the FPL, I injected medication into the FPL as well as into the ganglion cyst, return to see me as needed.    ____________________________________________ Ihor Austin. Benjamin Stain, M.D., ABFM., CAQSM., AME. Primary Care and Sports Medicine [facility]  Adjunct Professor of Family Medicine  [employer]  Restaurant manager, [employer]

## 2023-01-23 MED ORDER — ALENDRONATE SODIUM 70 MG PO TABS: 70.0000 mg | ORAL_TABLET | ORAL | 11 refills | Status: DC

## 2023-01-25 ENCOUNTER — Other Ambulatory Visit: Payer: Self-pay | Admitting: Family Medicine

## 2023-01-25 DIAGNOSIS — E782 Mixed hyperlipidemia: Secondary | ICD-10-CM

## 2023-01-26 ENCOUNTER — Other Ambulatory Visit: Payer: Self-pay | Admitting: Family Medicine

## 2023-01-26 MED ORDER — LOSARTAN POTASSIUM 25 MG PO TABS: 25.0000 mg | ORAL_TABLET | Freq: Every day | ORAL | 3 refills | Status: DC

## 2023-01-30 ENCOUNTER — Encounter: Payer: Self-pay | Admitting: Internal Medicine

## 2023-02-01 ENCOUNTER — Other Ambulatory Visit: Payer: Self-pay | Admitting: Family Medicine

## 2023-02-09 ENCOUNTER — Other Ambulatory Visit: Payer: Self-pay

## 2023-02-09 ENCOUNTER — Encounter: Payer: Self-pay | Admitting: Urology

## 2023-02-09 ENCOUNTER — Telehealth: Payer: Self-pay

## 2023-02-09 ENCOUNTER — Other Ambulatory Visit: Payer: Medicare Other

## 2023-02-09 DIAGNOSIS — N390 Urinary tract infection, site not specified: Secondary | ICD-10-CM

## 2023-02-09 LAB — URINALYSIS, ROUTINE W REFLEX MICROSCOPIC
Bilirubin, UA: NEGATIVE
Ketones, UA: NEGATIVE
Leukocytes,UA: NEGATIVE
Nitrite, UA: NEGATIVE
Protein,UA: NEGATIVE
RBC, UA: NEGATIVE
Specific Gravity, UA: 1.025 (ref 1.005–1.030)
Urobilinogen, Ur: 0.2 mg/dL (ref 0.2–1.0)
pH, UA: 7 (ref 5.0–7.5)

## 2023-02-09 NOTE — Telephone Encounter (Signed)
PA initiated:  Antha Steffenhagen (Key: [id number])

## 2023-02-09 NOTE — Telephone Encounter (Signed)
[employer] called to let us know pt's insurance approved Ozempic medication from 02/09/23 to 02/09/24. Case #: [id number].

## 2023-02-16 ENCOUNTER — Other Ambulatory Visit: Payer: Self-pay | Admitting: Family Medicine

## 2023-02-17 ENCOUNTER — Ambulatory Visit: Payer: Medicare Other | Admitting: Urology

## 2023-03-03 NOTE — Progress Notes (Deleted)
[facility] [address], [address] [place], [place] [address] [phone]  Date:  03/05/2023   Name:  Debbie Hayes   DOB:  [birth year]   MRN:  [id number]  PCP:  Pearline Cables, MD    Chief Complaint: No chief complaint on file.   History of Present Illness:  Debbie Hayes is a 72 y.o. very pleasant female patient who presents with the following:  Patient seen today to discuss difficulty with sleep Most recent visit with myself was in January of this year- history of migraine headache, sleep apnea, diabetes, hypothyroidism, hyperlipidemia   History of statin intolerance, she is on Repatha She has history of chronic degenerative joint pain and uses oxycodone on occasion-she received #50 10 mg in January She uses Ambien 5 mg most days for sleep  Due for foot exam Eye exam Colon cancer screening Time to update A1c Recommend COVID booster, flu shot this fall Shingrix? We did labs in January including CMP, CBC, lipid, A1c, TSH  Since last visit together she was seen by cardiology in April-it looks like they discussed doing a Lexiscan but I do not see this result on the chart Elevated CAC: 92nd percentile, including calcium 237 in left main. Known statin myopathy. Continue Repatha. Given diabetes and elevated CAC, recommend Lexiscan nuclear stress test (unable to walk uphill due to back pain).   Hypertension: Blood pressure low normal since intentional weight loss. Reduce losartan to 25 mg daily.   She also saw urology due to frequent UTI-also seen in April with follow-up in May.  They noted resistance to her daily suppressive Macrodantin in recent cultures, so she has stopped taking it.  They added vaginal estrogen-at follow-up it looks like she was doing quite well  Fosamax Aspirin Marcelline Deist?  If still taking-we discussed stopping this medication due to frequent UTI Levothyroxine 125 mcg Losartan  25 Toprol-XL 50 Singulair Nurtec as needed Repatha Ozempic 1 mg Trokendi for headache prophylaxis Trazodone 250 at bedtime Ambien 5 Patient Active Problem List   Diagnosis Date Noted   Agatston coronary artery calcium score greater than 400 11/06/2022   Statin myopathy 11/06/2022   Frequent UTI 10/29/2022   Wart of hand 09/30/2022   Acute cystitis without hematuria 05/14/2021   Medial epicondylitis, right 03/25/2021   Trigger thumb, right thumb 11/19/2020   Right wrist pain 10/23/2020   Osteoporosis 04/05/2020   Trigger finger, left middle finger 07/27/2019   Chronic neck pain 11/10/2018   Orthopedic hardware present 02/17/2018   Controlled diabetes mellitus type 2 with complications (HCC) 12/15/2017   Intractable chronic migraine without aura and with status migrainosus 12/15/2017   Arthralgia 12/15/2017   Joint pain 12/15/2017   Migraine, transformed 12/15/2017   Refractory migraine without aura 12/15/2017   Pain of left shoulder joint on movement 10/19/2017   OSA (obstructive sleep apnea) 06/26/2017   Snoring 03/25/2016   Palpitations 03/25/2016   Headache 03/25/2016   Sensory disorder of trigeminal nerve 11/26/2015   Spondylolisthesis of lumbar region 08/09/2014   Lumbar adjacent segment disease with spondylolisthesis 08/09/2014   Back pain 02/17/2014   Macular degeneration 02/17/2014   Backache 02/17/2014   Other and unspecified hyperlipidemia 10/14/2013   Hyperlipidemia 10/14/2013   Onychomycosis 12/01/2012   Arthritis 04/12/2012   Hypothyroidism 04/12/2012    Past Medical History:  Diagnosis Date   Allergy    Cancer (HCC)    thyroid cancer   Cataract  Chronic migraine BOTOX INJECTION EVERY 3 MONTHS   Chronic pain in right shoulder    Constipation    Diabetes mellitus    Disorder of inner ear CAUSES VERTIGO OCCASIONALLY   Fibromyalgia    GERD (gastroesophageal reflux disease)    Hemorrhoid    History of bladder infections    History of thyroid  cancer 2009  S/P TOTAL THYROIDECTOMY AND RADIATION   Hyperlipidemia    Hypertension CARDIOLOGIST- DR Donnie Aho- WILL REQUEST LATE NOTE   DENIES S & S   Hypothyroidism    Insomnia    Left shoulder pain    Macular degeneration    MRSA infection    nasal treated more than 15 years ago   Normal nuclear stress test 01-25-2009   OA (osteoarthritis) JOINTS   Osteopenia    Painful orthopaedic hardware (HCC)    left foot   PONV (postoperative nausea and vomiting) SEVERE   Rotator cuff disorder LEFT SHOULDER RTC IMPINGMENT   Sleep apnea    mouth guard, no cpap    Sleep apnea in adult 06/26/2017   Spondylolisthesis of lumbar region    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   Wears glasses     Past Surgical History:  Procedure Laterality Date   BACK SURGERY     Dr Lovell Sheehan- spondylosis   BILATERAL CARPAL TUNNEL RELEASE  1994   BILATERAL ELBOW SURG.  1999   BILATERAL SALPINGOOPHECTOMY  1993   POST-OP URETER REPAIR 12 DAYS AFTER    CATARACT EXTRACTION, BILATERAL     COLONOSCOPY     EYE SURGERY     FOOT ARTHRODESIS Left 07/10/2016   Procedure: LEFT 2ND TARSAL METATARSAL ARTHRODESIS  GASTROC RECESSION LEFT LAPIDUS MODIFIED MCBRIDE BUIONECTOMY;  Surgeon: Toni Arthurs, MD;  Location: [facility];  Service: Orthopedics;  Laterality: Left;   GASTROC RECESSION EXTREMITY Left 07/10/2016   Procedure: GASTROC RECESSION EXTREMITY;  Surgeon: Toni Arthurs, MD;  Location: [facility];  Service: Orthopedics;  Laterality: Left;   HARDWARE REMOVAL Left 05/20/2018   Procedure: Left foot removal of deep implants;  Surgeon: Toni Arthurs, MD;  Location: [facility];  Service: Orthopedics;  Laterality: Left;    LEFT THUMB JOINT REPLACEMENT  2005   RIGHT DONE IN 2004   OOPHORECTOMY     POLYPECTOMY     RIGHT KNEE ARTHROSCOPY  X2  BEFORE 2011   RIGHT KNEE ARTHROSCOPY/ PARTIAL LATERAL MENISECTOMY/ TRICOMPARTMENT CHONDROPLASTY/ DECOMPRESSION CYST  10-26-2009   RIGHT  KNEE CLOSED MANIPULATION  09-11-2010   RIGHT SHOULDER ARTHROSCOPY  2010  &  2004   TIBIA CYST REMOVED AND ORIF LEG FX  1958   TONSILLECTOMY  1968   TOTAL KNEE ARTHROPLASTY  07-16-2010   RIGHT   TOTAL THYROIDECTOMY  2009   CANCER  (POST-OP BLEED)  AND RADIATION TX   trigger fingers Right 2013   3rd and 4th fingers   UPPER GASTROINTESTINAL ENDOSCOPY     VAGINAL HYSTERECTOMY  1990    Social History   Tobacco Use   Smoking status: Never   Smokeless tobacco: Never  Vaping Use   Vaping status: Never Used  Substance Use Topics   Alcohol use: Yes    Comment: RARE   Drug use: No    Family History  Problem Relation Age of Onset   Colon polyps Sister 63   Alzheimer's disease Mother    Heart disease Father    Cirrhosis Father    Heart attack Father  Macular degeneration Father    Colon cancer Neg Hx     Allergies  Allergen Reactions   Sulfa Antibiotics Hives   Statins Other (See Comments)    Pt has tried multiple and cannot tolerate    Medication list has been reviewed and updated.  Current Outpatient Medications on File Prior to Visit  Medication Sig Dispense Refill   acetaminophen (TYLENOL) 500 MG tablet Take 500 mg by mouth every 6 (six) hours as needed.     alendronate (FOSAMAX) 70 MG tablet Take 1 tablet (70 mg total) by mouth every 7 (seven) days. Take with a full glass of water on an empty stomach. 4 tablet 11   aspirin EC 81 MG tablet Take 81 mg by mouth daily. Swallow whole.     blood glucose meter kit and supplies KIT Test blood sugar once daily. Dx code: E11.9 1 each 0   Blood Glucose Monitoring Suppl (FREESTYLE LITE) DEVI Check blood sugar no more than twice daily 1 each 0   Blood Glucose Monitoring Suppl (ONE TOUCH ULTRA 2) w/Device KIT Dispense one meter and test strips, lancets- 100 of each with PRN RF 1 kit 0   Cranberry 125 MG TABS Take by mouth.     dapagliflozin propanediol (FARXIGA) 10 MG TABS tablet TAKE 1 TABLET(10 MG) BY MOUTH DAILY 90 tablet 1    Fexofenadine HCl (ALLEGRA PO) Take by mouth.     glucose blood (FREESTYLE LITE) test strip CHECK BLOOD SUGARS NO MORE THAN TWICE DAILY 100 each 12   lactobacillus acidophilus (BACID) TABS tablet Take 2 tablets by mouth 3 (three) times daily.     Lancets (FREESTYLE) lancets Check blood sugars no more than twice daily 100 each 12   levothyroxine (SYNTHROID) 125 MCG tablet TAKE 1 TABLET(125 MCG) BY MOUTH DAILY BEFORE AND BREAKFAST 90 tablet 3   losartan (COZAAR) 25 MG tablet Take 1 tablet (25 mg total) by mouth daily. 90 tablet 3   Magnesium 400 MG CAPS Take by mouth.     metoprolol succinate (TOPROL-XL) 50 MG 24 hr tablet TAKE 1 TABLET(50 MG) BY MOUTH DAILY 90 tablet 1   montelukast (SINGULAIR) 10 MG tablet Take 1 tablet (10 mg total) by mouth at bedtime. 90 tablet 1   Oxycodone HCl 10 MG TABS Take 1 tablet (10 mg total) by mouth in the morning and at bedtime. 30 tablet 0   Oxycodone HCl 10 MG TABS Take 1 tablet (10 mg total) by mouth every 8 (eight) hours as needed. 50 tablet 0   psyllium (METAMUCIL) 58.6 % powder Take 1 packet by mouth 3 (three) times daily.     REPATHA SURECLICK 140 MG/ML SOAJ ADMINISTER 1 ML UNDER THE SKIN EVERY 14 DAYS 6 mL 1   Rimegepant Sulfate (NURTEC) 75 MG TBDP Use one daily as needed for migraine headache 16 tablet 5   Semaglutide, 1 MG/DOSE, 4 MG/3ML SOPN Inject 1 mg as directed once a week. 9 mL 2   Topiramate ER (TROKENDI XR) 100 MG CP24 TAKE 1 CAPSULE BY MOUTH DAILY 90 capsule 3   traZODone (DESYREL) 100 MG tablet TAKE 2 AND 1/2 TABLETS(250 MG) BY MOUTH AT BEDTIME 225 tablet 1   zolpidem (AMBIEN) 5 MG tablet TAKE 1 TABLET(5 MG) BY MOUTH AT BEDTIME AS NEEDED FOR SLEEP 90 tablet 1   No current facility-administered medications on file prior to visit.    Review of Systems:  ***  Physical Examination: There were no vitals filed for this visit. There  were no vitals filed for this visit. There is no height or weight on file to calculate BMI. Ideal Body Weight:     ***  Assessment and Plan: ***  Signed Abbe Amsterdam, MD

## 2023-03-04 ENCOUNTER — Other Ambulatory Visit: Payer: Self-pay | Admitting: Family Medicine

## 2023-03-05 ENCOUNTER — Telehealth: Payer: Self-pay

## 2023-03-05 ENCOUNTER — Ambulatory Visit: Payer: Medicare Other | Admitting: Family Medicine

## 2023-03-05 DIAGNOSIS — E118 Type 2 diabetes mellitus with unspecified complications: Secondary | ICD-10-CM

## 2023-03-05 DIAGNOSIS — E039 Hypothyroidism, unspecified: Secondary | ICD-10-CM

## 2023-03-05 DIAGNOSIS — M810 Age-related osteoporosis without current pathological fracture: Secondary | ICD-10-CM

## 2023-03-05 NOTE — Telephone Encounter (Signed)
[facility] - Client Client Site [facility] - Day Provider Copland, Shanda Bumps- MD Contact Type Call Who Is Calling Patient / Member / Family / Caregiver Caller Name Kadance Iwata Caller Phone Number [phone] Patient Name Debbie Hayes Patient DOB [birth year] Call Type Message Only Information Provided Reason for Call Request to Reschedule Office Appointment Initial Comment Caller states she has appt and needs to re-schedule due to the weather. Disp. Time Disposition Final User 03/05/2023 7:02:14 AM General Information Provided Yes Shirley Muscat Call Closed By: Shirley Muscat Transaction Date/Time: 03/05/2023 6:59:34 AM (ET)

## 2023-03-22 NOTE — Patient Instructions (Incomplete)
It was great to see you again today Recommend COVID booster, flu shot this fall Recommend shingles vaccine series if not done already  Please give Isabelle Course and Onalee Hua hugs from me  I refilled your medications- avoid combining oxycodone and Remus Loffler  Assuming all is well please see me in about 6 months Let me know if any issues with that toenail

## 2023-03-22 NOTE — Progress Notes (Unsigned)
[facility] [address], [address] [place], [place] [address] [phone]  Date:  03/23/2023   Name:  Debbie Hayes   DOB:  10-04-1950   MRN:  [id number]  PCP:  Pearline Cables, MD    Chief Complaint: discuss medications   History of Present Illness:  Debbie Hayes is a 72 y.o. very pleasant female patient who presents with the following:  Patient seen today to follow-up on diabetes control- history of migraine headache, sleep apnea, diabetes, hypothyroidism, hyperlipidemia, chronic joint pain with periodic use of narcotic pain medication Most recent visit with myself was in January  She has not been able to tolerate statins, instead uses Repatha for cholesterol control Levothyroxine 125 Tradjenta 5 Losartan 50 Toprol-XL 50 Singulair Nurtec Topiramate daily Ozempic 1 mg-  Trazodone to 50 at bedtime Ambien 5 as needed  Lab Results  Component Value Date   HGBA1C 6.9 (H) 08/07/2022   We did a CT coronary this spring-92nd percentile She followed up with Dr. Cletis Media office with cardiology-they recommended a Lexiscan which was completed in April of this year, she did well: 1 Day Rest/Stress protocol.  Stress EKG is non-diagnostic, as this is pharmacological stress test using Lexiscan. Normal myocardial perfusion without evidence of ischemia or infarct. Left ventricular size is normal, wall thickness is preserved, calculated LVEF 65%. Low risk study.  She has also been seen by urology for frequent UTI-they came up with a supportive plan to help her reduce UTI frequency  Foot exam due today Eye exam- this is due  Can update lab work today  She notes a skin lesion on her right forehead that she would like to have frozen off  She is using trazodone 225 and ambien to sleep right now -admits they have been under some stress.  One of their daughters has Down syndrome and her health is declining.  This has  been very difficult of course  She plans a trip to [place] to visit one of their children coming up, airplane travel- and the physical exertion of travel tends to exacerbate her joint pain.  She wonders if I can refill her oxycodone-I last provided a prescription for #50 oxycodone 10 in January of this year Patient Active Problem List   Diagnosis Date Noted   Agatston coronary artery calcium score greater than 400 11/06/2022   Statin myopathy 11/06/2022   Frequent UTI 10/29/2022   Wart of hand 09/30/2022   Acute cystitis without hematuria 05/14/2021   Medial epicondylitis, right 03/25/2021   Trigger thumb, right thumb 11/19/2020   Right wrist pain 10/23/2020   Osteoporosis 04/05/2020   Trigger finger, left middle finger 07/27/2019   Chronic neck pain 11/10/2018   Orthopedic hardware present 02/17/2018   Controlled diabetes mellitus type 2 with complications (HCC) 12/15/2017   Intractable chronic migraine without aura and with status migrainosus 12/15/2017   Arthralgia 12/15/2017   Joint pain 12/15/2017   Migraine, transformed 12/15/2017   Refractory migraine without aura 12/15/2017   Pain of left shoulder joint on movement 10/19/2017   OSA (obstructive sleep apnea) 06/26/2017   Snoring 03/25/2016   Palpitations 03/25/2016   Headache 03/25/2016   Sensory disorder of trigeminal nerve 11/26/2015   Spondylolisthesis of lumbar region 08/09/2014   Lumbar adjacent segment disease with spondylolisthesis 08/09/2014   Back pain 02/17/2014   Macular degeneration 02/17/2014   Backache 02/17/2014   Other and unspecified hyperlipidemia 10/14/2013   Hyperlipidemia  10/14/2013   Onychomycosis 12/01/2012   Arthritis 04/12/2012   Hypothyroidism 04/12/2012    Past Medical History:  Diagnosis Date   Allergy    Cancer (HCC)    thyroid cancer   Cataract    Chronic migraine BOTOX INJECTION EVERY 3 MONTHS   Chronic pain in right shoulder    Constipation    Diabetes mellitus    Disorder of  inner ear CAUSES VERTIGO OCCASIONALLY   Fibromyalgia    GERD (gastroesophageal reflux disease)    Hemorrhoid    History of bladder infections    History of thyroid cancer 2009  S/P TOTAL THYROIDECTOMY AND RADIATION   Hyperlipidemia    Hypertension CARDIOLOGIST- DR Donnie Aho- WILL REQUEST LATE NOTE   DENIES S & S   Hypothyroidism    Insomnia    Left shoulder pain    Macular degeneration    MRSA infection    nasal treated more than 15 years ago   Normal nuclear stress test 01-25-2009   OA (osteoarthritis) JOINTS   Osteopenia    Painful orthopaedic hardware (HCC)    left foot   PONV (postoperative nausea and vomiting) SEVERE   Rotator cuff disorder LEFT SHOULDER RTC IMPINGMENT   Sleep apnea    mouth guard, no cpap    Sleep apnea in adult 06/26/2017   Spondylolisthesis of lumbar region    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   Wears glasses     Past Surgical History:  Procedure Laterality Date   BACK SURGERY     Dr Lovell Sheehan- spondylosis   BILATERAL CARPAL TUNNEL RELEASE  1994   BILATERAL ELBOW SURG.  1999   BILATERAL SALPINGOOPHECTOMY  1993   POST-OP URETER REPAIR 12 DAYS AFTER    CATARACT EXTRACTION, BILATERAL     COLONOSCOPY     EYE SURGERY     FOOT ARTHRODESIS Left 07/10/2016   Procedure: LEFT 2ND TARSAL METATARSAL ARTHRODESIS  GASTROC RECESSION LEFT LAPIDUS MODIFIED MCBRIDE BUIONECTOMY;  Surgeon: Toni Arthurs, MD;  Location: [facility];  Service: Orthopedics;  Laterality: Left;   GASTROC RECESSION EXTREMITY Left 07/10/2016   Procedure: GASTROC RECESSION EXTREMITY;  Surgeon: Toni Arthurs, MD;  Location: [facility];  Service: Orthopedics;  Laterality: Left;   HARDWARE REMOVAL Left 05/20/2018   Procedure: Left foot removal of deep implants;  Surgeon: Toni Arthurs, MD;  Location: [facility];  Service: Orthopedics;  Laterality: Left;    LEFT THUMB JOINT REPLACEMENT  2005   RIGHT DONE IN 2004   OOPHORECTOMY     POLYPECTOMY      RIGHT KNEE ARTHROSCOPY  X2  BEFORE 2011   RIGHT KNEE ARTHROSCOPY/ PARTIAL LATERAL MENISECTOMY/ TRICOMPARTMENT CHONDROPLASTY/ DECOMPRESSION CYST  10-26-2009   RIGHT KNEE CLOSED MANIPULATION  09-11-2010   RIGHT SHOULDER ARTHROSCOPY  2010  &  2004   TIBIA CYST REMOVED AND ORIF LEG FX  1958   TONSILLECTOMY  1968   TOTAL KNEE ARTHROPLASTY  07-16-2010   RIGHT   TOTAL THYROIDECTOMY  2009   CANCER  (POST-OP BLEED)  AND RADIATION TX   trigger fingers Right 2013   3rd and 4th fingers   UPPER GASTROINTESTINAL ENDOSCOPY     VAGINAL HYSTERECTOMY  1990    Social History   Tobacco Use   Smoking status: Never   Smokeless tobacco: Never  Vaping Use   Vaping status: Never Used  Substance Use Topics   Alcohol use: Yes    Comment: RARE   Drug use: No  Family History  Problem Relation Age of Onset   Colon polyps Sister 76   Alzheimer's disease Mother    Heart disease Father    Cirrhosis Father    Heart attack Father    Macular degeneration Father    Colon cancer Neg Hx     Allergies  Allergen Reactions   Sulfa Antibiotics Hives   Statins Other (See Comments)    Pt has tried multiple and cannot tolerate    Medication list has been reviewed and updated.  Current Outpatient Medications on File Prior to Visit  Medication Sig Dispense Refill   acetaminophen (TYLENOL) 500 MG tablet Take 500 mg by mouth every 6 (six) hours as needed.     alendronate (FOSAMAX) 70 MG tablet Take 1 tablet (70 mg total) by mouth every 7 (seven) days. Take with a full glass of water on an empty stomach. 4 tablet 11   aspirin EC 81 MG tablet Take 81 mg by mouth daily. Swallow whole.     blood glucose meter kit and supplies KIT Test blood sugar once daily. Dx code: E11.9 1 each 0   Blood Glucose Monitoring Suppl (FREESTYLE LITE) DEVI Check blood sugar no more than twice daily 1 each 0   Blood Glucose Monitoring Suppl (ONE TOUCH ULTRA 2) w/Device KIT Dispense one meter and test strips, lancets- 100 of each with  PRN RF 1 kit 0   Cranberry 125 MG TABS Take by mouth.     dapagliflozin propanediol (FARXIGA) 10 MG TABS tablet TAKE 1 TABLET(10 MG) BY MOUTH DAILY 90 tablet 1   Fexofenadine HCl (ALLEGRA PO) Take by mouth.     glucose blood (FREESTYLE LITE) test strip CHECK BLOOD SUGARS NO MORE THAN TWICE DAILY 100 each 12   lactobacillus acidophilus (BACID) TABS tablet Take 2 tablets by mouth 3 (three) times daily.     Lancets (FREESTYLE) lancets Check blood sugars no more than twice daily 100 each 12   levothyroxine (SYNTHROID) 125 MCG tablet TAKE 1 TABLET BY MOUTH BEFORE BREAKFAST 90 tablet 3   losartan (COZAAR) 25 MG tablet Take 1 tablet (25 mg total) by mouth daily. 90 tablet 3   Magnesium 400 MG CAPS Take by mouth.     metoprolol succinate (TOPROL-XL) 50 MG 24 hr tablet TAKE 1 TABLET(50 MG) BY MOUTH DAILY 90 tablet 1   montelukast (SINGULAIR) 10 MG tablet Take 1 tablet (10 mg total) by mouth at bedtime. 90 tablet 1   Oxycodone HCl 10 MG TABS Take 1 tablet (10 mg total) by mouth in the morning and at bedtime. 30 tablet 0   psyllium (METAMUCIL) 58.6 % powder Take 1 packet by mouth 3 (three) times daily.     REPATHA SURECLICK 140 MG/ML SOAJ ADMINISTER 1 ML UNDER THE SKIN EVERY 14 DAYS 6 mL 1   Semaglutide, 1 MG/DOSE, 4 MG/3ML SOPN Inject 1 mg as directed once a week. 9 mL 2   Topiramate ER (TROKENDI XR) 100 MG CP24 TAKE 1 CAPSULE BY MOUTH DAILY 90 capsule 3   traZODone (DESYREL) 100 MG tablet TAKE 2 AND 1/2 TABLETS(250 MG) BY MOUTH AT BEDTIME 225 tablet 1   zolpidem (AMBIEN) 5 MG tablet TAKE 1 TABLET(5 MG) BY MOUTH AT BEDTIME AS NEEDED FOR SLEEP 90 tablet 1   No current facility-administered medications on file prior to visit.    Review of Systems:  As per HPI- otherwise negative.   Physical Examination: Vitals:   03/23/23 1522  BP: 122/80  Pulse: 75  Resp: 18  Temp: 97.8 F (36.6 C)  SpO2: 96%   Vitals:   03/23/23 1522  Weight: 162 lb 3.2 oz (73.6 kg)  Height: 5\' 8"  (1.727 m)   Body  mass index is 24.66 kg/m. Ideal Body Weight: Weight in (lb) to have BMI = 25: 164.1  GEN: no acute distress.  Normal weight, looks well HEENT: Atraumatic, Normocephalic.  Ears and Nose: No external deformity. CV: RRR, No M/G/R. No JVD. No thrill. No extra heart sounds. PULM: CTA B, no wheezes, crackles, rhonchi. No retractions. No resp. distress. No accessory muscle use. ABD: S, NT, ND, +BS. No rebound. No HSM. EXTR: No c/c/e PSYCH: Normally interactive. Conversant. Foot exam completed.  She recently injured her right great toenail on a refrigerator.  This toenail is loose and likely will be lost.  Otherwise foot exam is normal Actinic keratosis versus thin seborrheic keratosis on right forehead.  Verbal consent obtained, cryotherapy x 3 with liquid nitrogen.  Patient tolerated well with no immediate complications  Wt Readings from Last 3 Encounters:  03/23/23 162 lb 3.2 oz (73.6 kg)  12/16/22 161 lb (73 kg)  11/06/22 163 lb (73.9 kg)    Assessment and Plan: High coronary artery calcium score  Mixed hyperlipidemia  Diabetes mellitus type 2 with complications (HCC) - Plan: Basic metabolic panel, Hemoglobin A1c  Chronic joint pain - Plan: Oxycodone HCl 10 MG TABS, DRUG MONITORING, PANEL 8 WITH CONFIRMATION, URINE  Chronic migraine without aura without status migrainosus, not intractable - Plan: Amb ref to Medical Nutrition Therapy-MNT, CANCELED: Amb ref to Medical Nutrition Therapy-MNT  Chronic neck pain - Plan: Oxycodone HCl 10 MG TABS, DRUG MONITORING, PANEL 8 WITH CONFIRMATION, URINE  Mild intermittent reactive airway disease without complication - Plan: albuterol (VENTOLIN HFA) 108 (90 Base) MCG/ACT inhaler  Screening for deficiency anemia - Plan: CBC  Acquired hypothyroidism - Plan: TSH  Following up today.  Elevated coronary calcium has been evaluated with a Lexiscan, patient did well.  Continue same management Diabetes has recently been under good control, check  A1c Patient notes she may have some dietary triggers of her frequent migraine, she would be interested in discussing this with a dietitian to see what ideas they may have.  Placed referral to nutrition therapy Refilled oxycodone, cautioned patient not to combine with Ambien Refill albuterol Will plan further follow- up pending labs.   Signed Abbe Amsterdam, MD  Addnd 8/27- message to pt  Results for orders placed or performed in visit on 03/23/23  Basic metabolic panel  Result Value Ref Range   Sodium 141 135 - 145 mEq/L   Potassium 3.7 3.5 - 5.1 mEq/L   Chloride 105 96 - 112 mEq/L   CO2 27 19 - 32 mEq/L   Glucose, Bld 143 (H) 70 - 99 mg/dL   BUN 16 6 - 23 mg/dL   Creatinine, Ser 1.61 0.40 - 1.20 mg/dL   GFR 09.60 >45.40 mL/min   Calcium 9.0 8.4 - 10.5 mg/dL  CBC  Result Value Ref Range   WBC 6.1 4.0 - 10.5 K/uL   RBC 5.08 3.87 - 5.11 Mil/uL   Platelets 212.0 150.0 - 400.0 K/uL   Hemoglobin 14.5 12.0 - 15.0 g/dL   HCT 98.1 19.1 - 47.8 %   MCV 88.5 78.0 - 100.0 fl   MCHC 32.3 30.0 - 36.0 g/dL   RDW 29.5 62.1 - 30.8 %  Hemoglobin A1c  Result Value Ref Range   Hgb A1c MFr Bld 6.7 (H) 4.6 -  6.5 %  TSH  Result Value Ref Range   TSH 0.75 0.35 - 5.50 uIU/mL   *Note: Due to a large number of results and/or encounters for the requested time period, some results have not been displayed. A complete set of results can be found in Results Review.

## 2023-03-23 ENCOUNTER — Ambulatory Visit (INDEPENDENT_AMBULATORY_CARE_PROVIDER_SITE_OTHER): Payer: Medicare Other | Admitting: Family Medicine

## 2023-03-23 VITALS — BP 122/80 | HR 75 | Temp 97.8°F | Resp 18 | Ht 68.0 in | Wt 162.2 lb

## 2023-03-23 DIAGNOSIS — J4520 Mild intermittent asthma, uncomplicated: Secondary | ICD-10-CM

## 2023-03-23 DIAGNOSIS — M2550 Pain in unspecified joint: Secondary | ICD-10-CM

## 2023-03-23 DIAGNOSIS — E118 Type 2 diabetes mellitus with unspecified complications: Secondary | ICD-10-CM | POA: Diagnosis not present

## 2023-03-23 DIAGNOSIS — Z130 Encounter for screening for diseases of the blood and blood-forming organs and certain disorders involving the immune mechanism: Secondary | ICD-10-CM

## 2023-03-23 DIAGNOSIS — E039 Hypothyroidism, unspecified: Secondary | ICD-10-CM

## 2023-03-23 DIAGNOSIS — G43709 Chronic migraine without aura, not intractable, without status migrainosus: Secondary | ICD-10-CM

## 2023-03-23 DIAGNOSIS — E782 Mixed hyperlipidemia: Secondary | ICD-10-CM

## 2023-03-23 DIAGNOSIS — R931 Abnormal findings on diagnostic imaging of heart and coronary circulation: Secondary | ICD-10-CM

## 2023-03-23 DIAGNOSIS — G8929 Other chronic pain: Secondary | ICD-10-CM

## 2023-03-23 DIAGNOSIS — M542 Cervicalgia: Secondary | ICD-10-CM

## 2023-03-23 MED ORDER — OXYCODONE HCL 10 MG PO TABS
10.0000 mg | ORAL_TABLET | Freq: Three times a day (TID) | ORAL | 0 refills | Status: DC | PRN
Start: 2023-03-23 — End: 2023-07-01

## 2023-03-23 MED ORDER — ALBUTEROL SULFATE HFA 108 (90 BASE) MCG/ACT IN AERS
2.0000 | INHALATION_SPRAY | Freq: Four times a day (QID) | RESPIRATORY_TRACT | 2 refills | Status: DC | PRN
Start: 2023-03-23 — End: 2023-11-25

## 2023-03-24 ENCOUNTER — Encounter: Payer: Self-pay | Admitting: Family Medicine

## 2023-03-24 ENCOUNTER — Telehealth: Payer: Self-pay | Admitting: Family Medicine

## 2023-03-24 LAB — TSH: TSH: 0.75 u[IU]/mL (ref 0.35–5.50)

## 2023-03-24 LAB — BASIC METABOLIC PANEL
BUN: 16 mg/dL (ref 6–23)
CO2: 27 meq/L (ref 19–32)
Calcium: 9 mg/dL (ref 8.4–10.5)
Chloride: 105 meq/L (ref 96–112)
Creatinine, Ser: 0.62 mg/dL (ref 0.40–1.20)
GFR: 88.17 mL/min (ref >60.00)
Glucose, Bld: 143 mg/dL — ABNORMAL HIGH (ref 70–99)
Potassium: 3.7 mEq/L (ref 3.5–5.1)
Sodium: 141 meq/L (ref 135–145)

## 2023-03-24 LAB — DRUG MONITORING, PANEL 8 WITH CONFIRMATION, URINE
6 Acetylmorphine: NEGATIVE ng/mL (ref <10)
Alcohol Metabolites: NEGATIVE ng/mL (ref <500)
Amphetamines: NEGATIVE ng/mL (ref <500)
Benzodiazepines: NEGATIVE ng/mL (ref <100)
Buprenorphine, Urine: NEGATIVE ng/mL (ref <5)
Cocaine Metabolite: NEGATIVE ng/mL (ref <150)
Creatinine: 56.7 mg/dL (ref >=20.0)
MDMA: NEGATIVE ng/mL (ref <500)
Marijuana Metabolite: NEGATIVE ng/mL (ref <20)
Opiates: NEGATIVE ng/mL (ref <100)
Oxidant: NEGATIVE ug/mL (ref <200)
Oxycodone: NEGATIVE ng/mL (ref <100)
pH: 5.6 (ref 4.5–9.0)

## 2023-03-24 LAB — CBC
HCT: 45 % (ref 36.0–46.0)
Hemoglobin: 14.5 g/dL (ref 12.0–15.0)
MCHC: 32.3 g/dL (ref 30.0–36.0)
MCV: 88.5 fl (ref 78.0–100.0)
Platelets: 212 10*3/uL (ref 150.0–400.0)
RBC: 5.08 Mil/uL (ref 3.87–5.11)
RDW: 14.2 % (ref 11.5–15.5)
WBC: 6.1 10*3/uL (ref 4.0–10.5)

## 2023-03-24 LAB — HEMOGLOBIN A1C: Hgb A1c MFr Bld: 6.7 % — ABNORMAL HIGH (ref 4.6–6.5)

## 2023-03-24 LAB — DM TEMPLATE

## 2023-03-24 NOTE — Telephone Encounter (Signed)
Rep from [employer] just wanted to advise they approved pt's repatha short click for one year.

## 2023-03-24 NOTE — Telephone Encounter (Signed)
Debbie Hayes (Key: [id number])- Repatha   Rep from [employer] just wanted to advise they approved pt's repatha short click for one year.

## 2023-03-25 ENCOUNTER — Other Ambulatory Visit: Payer: Self-pay | Admitting: Family Medicine

## 2023-03-27 ENCOUNTER — Encounter: Payer: Self-pay | Admitting: Urology

## 2023-03-27 MED ORDER — CEFDINIR 300 MG PO CAPS: 300.0000 mg | ORAL_CAPSULE | Freq: Two times a day (BID) | ORAL | 0 refills | Status: AC

## 2023-03-31 NOTE — Addendum Note (Signed)
Addended by: Abbe Amsterdam C on: 03/31/2023 02:37 PM   Modules accepted: Orders

## 2023-04-06 ENCOUNTER — Other Ambulatory Visit: Payer: Self-pay | Admitting: Family Medicine

## 2023-04-06 DIAGNOSIS — E118 Type 2 diabetes mellitus with unspecified complications: Secondary | ICD-10-CM

## 2023-04-23 IMAGING — DX DG CHEST 2V
2 series · 2 of 2 positions shown · non-contrast
Comparison: 05/11/2017

CLINICAL DATA: Cough since 05/06/2021.

EXAM:
CHEST - 2 VIEW

[chest pa]
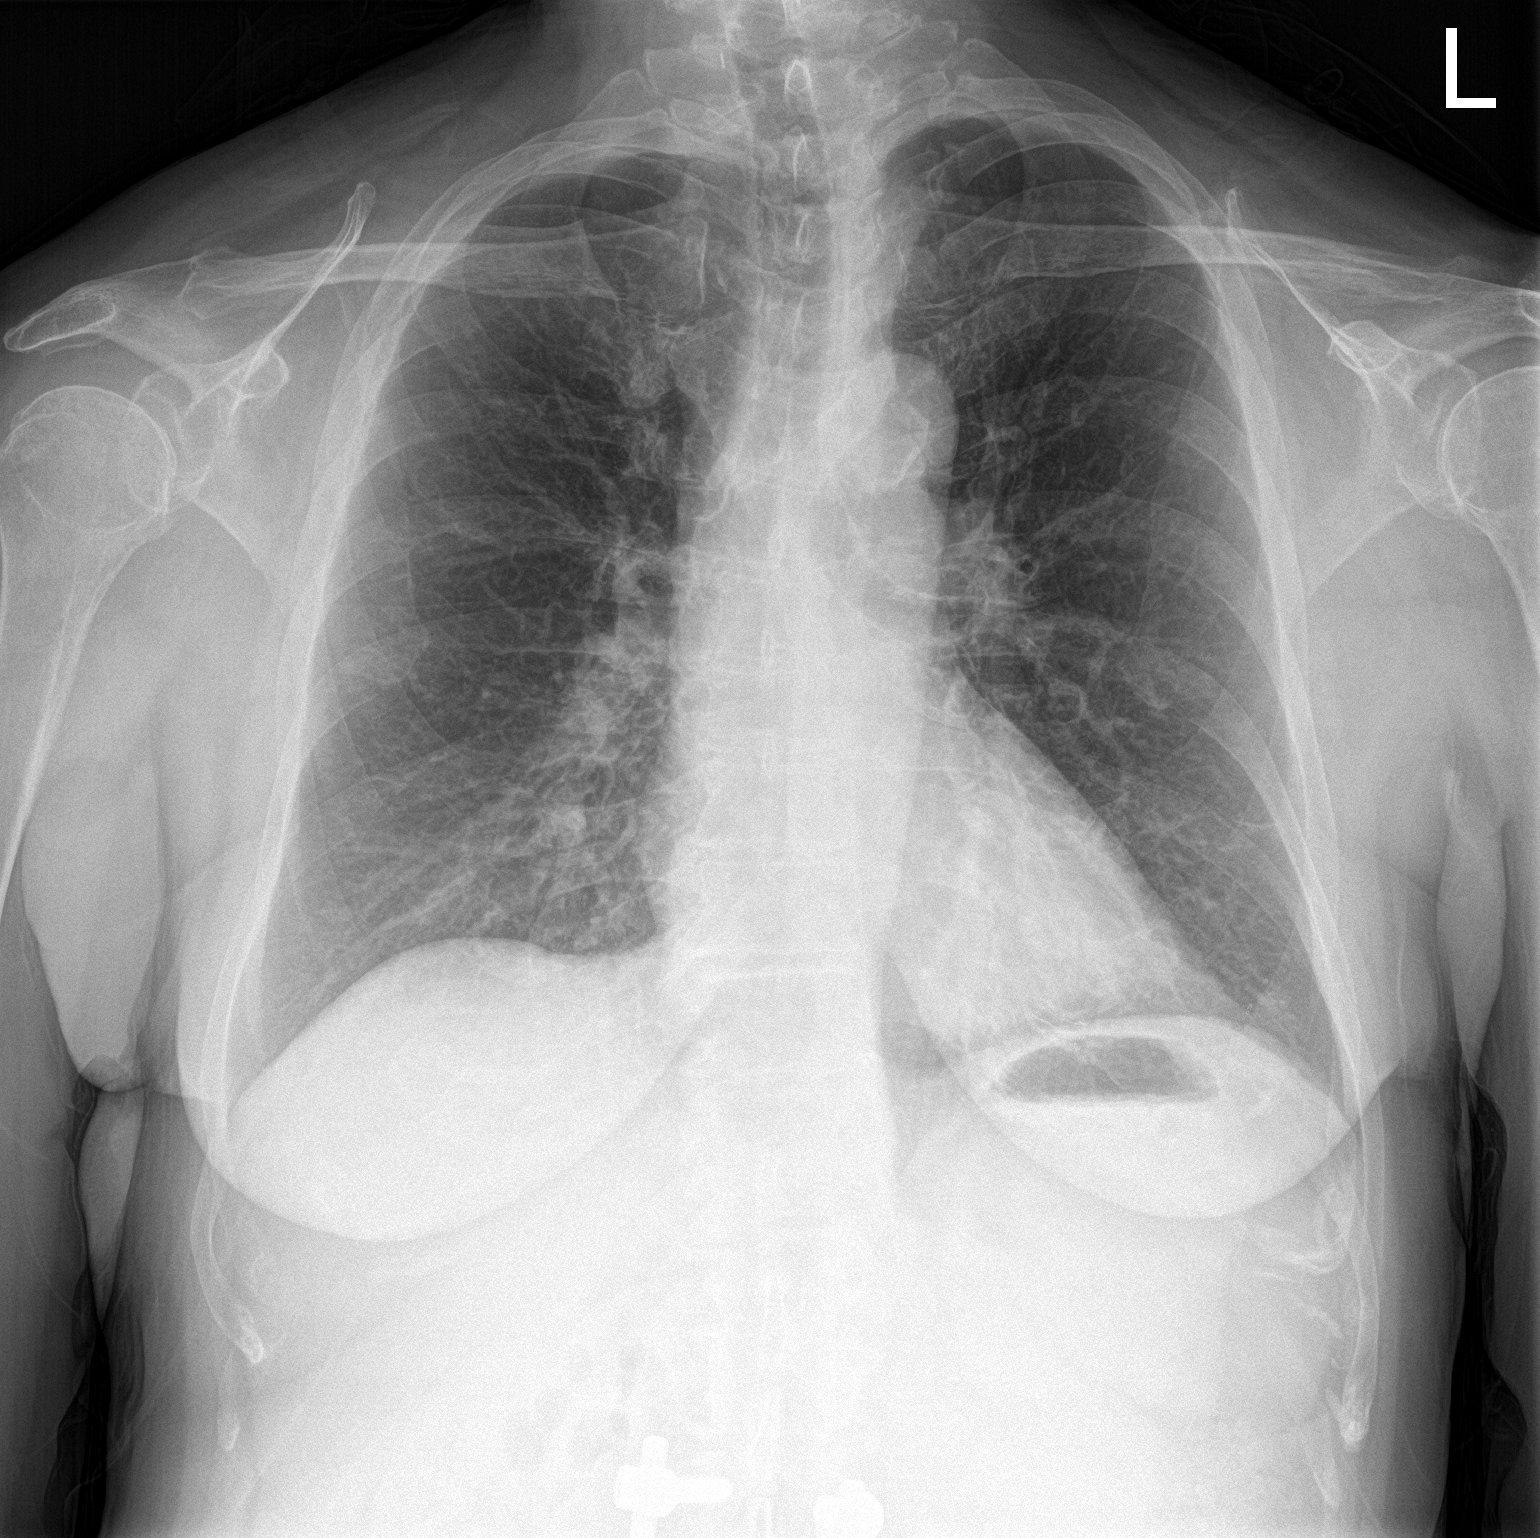

[chest lat]
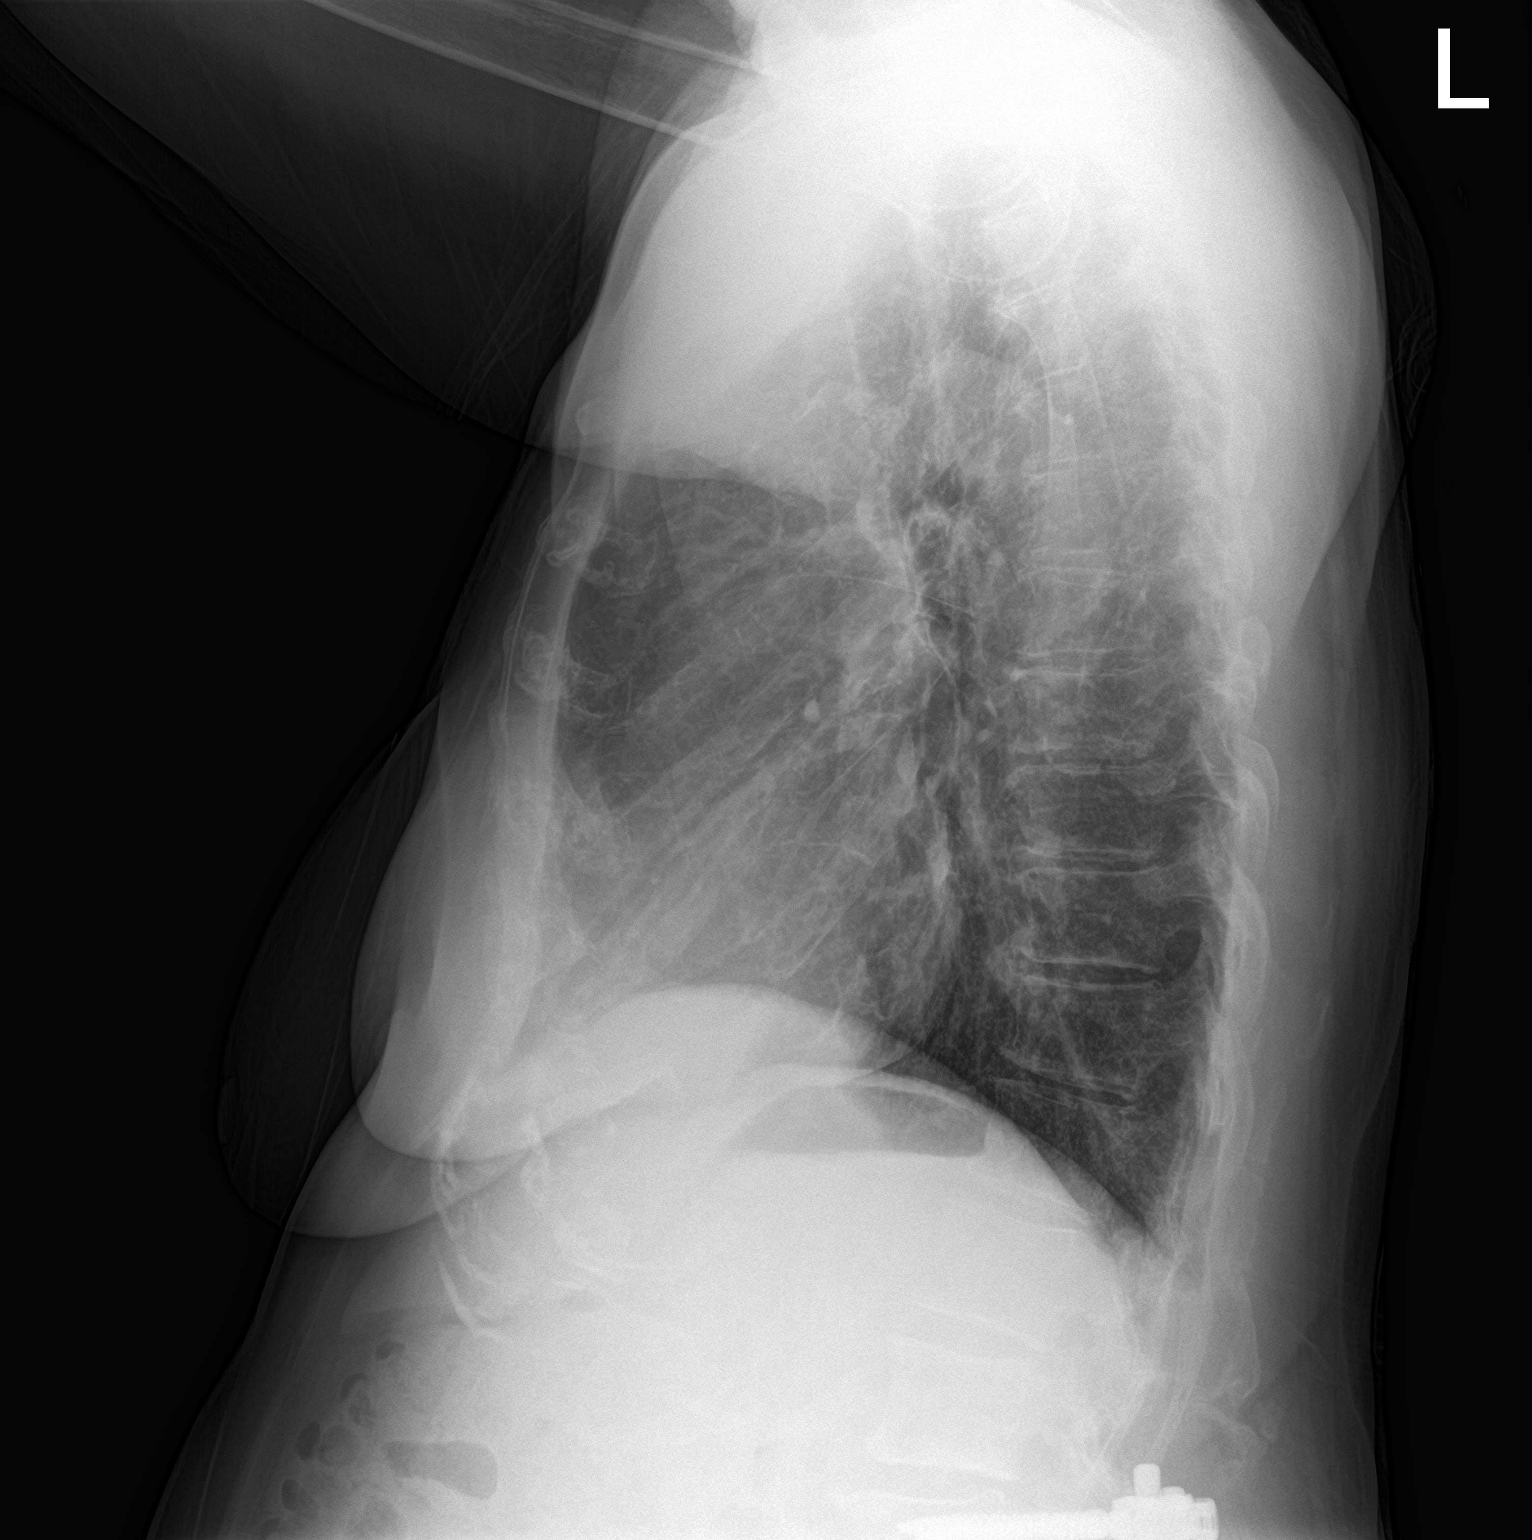

[2 of 2 positions shown; findings below may reference images not displayed]

FINDINGS: Heart size is normal. There is mild perihilar peribronchial
thickening. There are no focal consolidations. No pleural effusions
or pulmonary edema. Remote spinal fixation.
IMPRESSION: Bronchitic changes.

## 2023-05-08 ENCOUNTER — Ambulatory Visit: Payer: Self-pay | Admitting: Cardiology

## 2023-05-12 ENCOUNTER — Encounter: Payer: Self-pay | Admitting: Family Medicine

## 2023-05-12 DIAGNOSIS — E118 Type 2 diabetes mellitus with unspecified complications: Secondary | ICD-10-CM

## 2023-05-12 MED ORDER — OZEMPIC (0.25 OR 0.5 MG/DOSE) 2 MG/3ML ~~LOC~~ SOPN
0.5000 mg | PEN_INJECTOR | SUBCUTANEOUS | 2 refills | Status: DC
Start: 2023-05-12 — End: 2023-11-02

## 2023-05-12 MED ORDER — DICYCLOMINE HCL 20 MG PO TABS: 20.0000 mg | ORAL_TABLET | Freq: Four times a day (QID) | ORAL | 1 refills | Status: AC | PRN

## 2023-05-14 NOTE — Telephone Encounter (Signed)
Fax received from [facility] stating that PA was needed for Bentyl. Please complete.

## 2023-05-19 NOTE — Progress Notes (Deleted)
[facility] [address] [place], [place] [address] [phone]  Date:  05/20/2023   Name:  Debbie Hayes   DOB:  1951-01-25   MRN:  [id number]  PCP:  Pearline Cables, MD    Chief Complaint: No chief complaint on file.   History of Present Illness:  Debbie Hayes is a 72 y.o. very pleasant female patient who presents with the following:  Patient seen today with concern of cough Most recent visit with myself was in August of this year history of diabetes, statin myopathy, migraine headache, sleep apnea, diabetes, hypothyroidism, hyperlipidemia, chronic joint pain with periodic use of narcotic pain medication  She did a Lexiscan earlier this year for elevated coronary calcium Her most recent A1c showed very good control, 6.7%-she has been doing well with the addition of Ozempic Patient Active Problem List   Diagnosis Date Noted   Agatston coronary artery calcium score greater than 400 11/06/2022   Statin myopathy 11/06/2022   Frequent UTI 10/29/2022   Wart of hand 09/30/2022   Acute cystitis without hematuria 05/14/2021   Medial epicondylitis, right 03/25/2021   Trigger thumb, right thumb 11/19/2020   Right wrist pain 10/23/2020   Osteoporosis 04/05/2020   Trigger finger, left middle finger 07/27/2019   Chronic neck pain 11/10/2018   Orthopedic hardware present 02/17/2018   Controlled diabetes mellitus type 2 with complications (HCC) 12/15/2017   Intractable chronic migraine without aura and with status migrainosus 12/15/2017   Arthralgia 12/15/2017   Joint pain 12/15/2017   Migraine, transformed 12/15/2017   Refractory migraine without aura 12/15/2017   Pain of left shoulder joint on movement 10/19/2017   OSA (obstructive sleep apnea) 06/26/2017   Snoring 03/25/2016   Palpitations 03/25/2016   Headache 03/25/2016   Sensory disorder of trigeminal nerve 11/26/2015   Spondylolisthesis of lumbar  region 08/09/2014   Lumbar adjacent segment disease with spondylolisthesis 08/09/2014   Back pain 02/17/2014   Macular degeneration 02/17/2014   Backache 02/17/2014   Other and unspecified hyperlipidemia 10/14/2013   Hyperlipidemia 10/14/2013   Onychomycosis 12/01/2012   Arthritis 04/12/2012   Hypothyroidism 04/12/2012    Past Medical History:  Diagnosis Date   Allergy    Cancer (HCC)    thyroid cancer   Cataract    Chronic migraine BOTOX INJECTION EVERY 3 MONTHS   Chronic pain in right shoulder    Constipation    Diabetes mellitus    Disorder of inner ear CAUSES VERTIGO OCCASIONALLY   Fibromyalgia    GERD (gastroesophageal reflux disease)    Hemorrhoid    History of bladder infections    History of thyroid cancer 2009  S/P TOTAL THYROIDECTOMY AND RADIATION   Hyperlipidemia    Hypertension CARDIOLOGIST- DR Donnie Aho- WILL REQUEST LATE NOTE   DENIES S & S   Hypothyroidism    Insomnia    Left shoulder pain    Macular degeneration    MRSA infection    nasal treated more than 15 years ago   Normal nuclear stress test 01-25-2009   OA (osteoarthritis) JOINTS   Osteopenia    Painful orthopaedic hardware (HCC)    left foot   PONV (postoperative nausea and vomiting) SEVERE   Rotator cuff disorder LEFT SHOULDER RTC IMPINGMENT   Sleep apnea    mouth guard, no cpap    Sleep apnea in adult 06/26/2017   Spondylolisthesis of lumbar region    TMJ syndrome WEARS APPLIANCE  AT NIGHT   Wears glasses     Past Surgical History:  Procedure Laterality Date   BACK SURGERY     Dr Lovell Sheehan- spondylosis   BILATERAL CARPAL TUNNEL RELEASE  1994   BILATERAL ELBOW SURG.  1999   BILATERAL SALPINGOOPHECTOMY  1993   POST-OP URETER REPAIR 12 DAYS AFTER    CATARACT EXTRACTION, BILATERAL     COLONOSCOPY     EYE SURGERY     FOOT ARTHRODESIS Left 07/10/2016   Procedure: LEFT 2ND TARSAL METATARSAL ARTHRODESIS  GASTROC RECESSION LEFT LAPIDUS MODIFIED MCBRIDE BUIONECTOMY;  Surgeon: Toni Arthurs, MD;   Location: [facility];  Service: Orthopedics;  Laterality: Left;   GASTROC RECESSION EXTREMITY Left 07/10/2016   Procedure: GASTROC RECESSION EXTREMITY;  Surgeon: Toni Arthurs, MD;  Location: [facility];  Service: Orthopedics;  Laterality: Left;   HARDWARE REMOVAL Left 05/20/2018   Procedure: Left foot removal of deep implants;  Surgeon: Toni Arthurs, MD;  Location:  SURGERY CENTER;  Service: Orthopedics;  Laterality: Left;    LEFT THUMB JOINT REPLACEMENT  2005   RIGHT DONE IN 2004   OOPHORECTOMY     POLYPECTOMY     RIGHT KNEE ARTHROSCOPY  X2  BEFORE 2011   RIGHT KNEE ARTHROSCOPY/ PARTIAL LATERAL MENISECTOMY/ TRICOMPARTMENT CHONDROPLASTY/ DECOMPRESSION CYST  10-26-2009   RIGHT KNEE CLOSED MANIPULATION  09-11-2010   RIGHT SHOULDER ARTHROSCOPY  2010  &  2004   TIBIA CYST REMOVED AND ORIF LEG FX  1958   TONSILLECTOMY  1968   TOTAL KNEE ARTHROPLASTY  07-16-2010   RIGHT   TOTAL THYROIDECTOMY  2009   CANCER  (POST-OP BLEED)  AND RADIATION TX   trigger fingers Right 2013   3rd and 4th fingers   UPPER GASTROINTESTINAL ENDOSCOPY     VAGINAL HYSTERECTOMY  1990    Social History   Tobacco Use   Smoking status: Never   Smokeless tobacco: Never  Vaping Use   Vaping status: Never Used  Substance Use Topics   Alcohol use: Yes    Comment: RARE   Drug use: No    Family History  Problem Relation Age of Onset   Colon polyps Sister 65   Alzheimer's disease Mother    Heart disease Father    Cirrhosis Father    Heart attack Father    Macular degeneration Father    Colon cancer Neg Hx     Allergies  Allergen Reactions   Sulfa Antibiotics Hives   Statins Other (See Comments)    Pt has tried multiple and cannot tolerate    Medication list has been reviewed and updated.  Current Outpatient Medications on File Prior to Visit  Medication Sig Dispense Refill   acetaminophen (TYLENOL) 500 MG tablet Take 500 mg by mouth every 6 (six) hours as  needed.     albuterol (VENTOLIN HFA) 108 (90 Base) MCG/ACT inhaler Inhale 2 puffs into the lungs every 6 (six) hours as needed for wheezing or shortness of breath. 2 each 2   alendronate (FOSAMAX) 70 MG tablet Take 1 tablet (70 mg total) by mouth every 7 (seven) days. Take with a full glass of water on an empty stomach. 4 tablet 11   aspirin EC 81 MG tablet Take 81 mg by mouth daily. Swallow whole.     blood glucose meter kit and supplies KIT Test blood sugar once daily. Dx code: E11.9 1 each 0   Blood Glucose Monitoring Suppl (FREESTYLE LITE) DEVI Check blood sugar  no more than twice daily 1 each 0   Blood Glucose Monitoring Suppl (ONE TOUCH ULTRA 2) w/Device KIT Dispense one meter and test strips, lancets- 100 of each with PRN RF 1 kit 0   Cranberry 125 MG TABS Take by mouth.     dapagliflozin propanediol (FARXIGA) 10 MG TABS tablet TAKE 1 TABLET(10 MG) BY MOUTH DAILY 90 tablet 1   dicyclomine (BENTYL) 20 MG tablet Take 1 tablet (20 mg total) by mouth 4 (four) times daily as needed for spasms. 60 tablet 1   Fexofenadine HCl (ALLEGRA PO) Take by mouth.     glucose blood (FREESTYLE LITE) test strip CHECK BLOOD SUGARS NO MORE THAN TWICE DAILY 100 each 12   lactobacillus acidophilus (BACID) TABS tablet Take 2 tablets by mouth 3 (three) times daily.     Lancets (FREESTYLE) lancets Check blood sugars no more than twice daily 100 each 12   levothyroxine (SYNTHROID) 125 MCG tablet TAKE 1 TABLET BY MOUTH BEFORE BREAKFAST 90 tablet 3   losartan (COZAAR) 25 MG tablet Take 1 tablet (25 mg total) by mouth daily. 90 tablet 3   Magnesium 400 MG CAPS Take by mouth.     metoprolol succinate (TOPROL-XL) 50 MG 24 hr tablet TAKE 1 TABLET(50 MG) BY MOUTH DAILY 90 tablet 1   montelukast (SINGULAIR) 10 MG tablet Take 1 tablet (10 mg total) by mouth at bedtime. 90 tablet 1   Oxycodone HCl 10 MG TABS Take 1 tablet (10 mg total) by mouth in the morning and at bedtime. 30 tablet 0   Oxycodone HCl 10 MG TABS Take 1-1.5  tablets (10-15 mg total) by mouth every 8 (eight) hours as needed. 60 tablet 0   psyllium (METAMUCIL) 58.6 % powder Take 1 packet by mouth 3 (three) times daily.     REPATHA SURECLICK 140 MG/ML SOAJ ADMINISTER 1 ML UNDER THE SKIN EVERY 14 DAYS 6 mL 1   Semaglutide,0.25 or 0.5MG /DOS, (OZEMPIC, 0.25 OR 0.5 MG/DOSE,) 2 MG/3ML SOPN Inject 0.5 mg into the skin once a week. 9 mL 2   Topiramate ER (TROKENDI XR) 100 MG CP24 TAKE 1 CAPSULE BY MOUTH DAILY 90 capsule 3   traZODone (DESYREL) 100 MG tablet TAKE 2 AND 1/2 TABLETS(250 MG) BY MOUTH AT BEDTIME 225 tablet 1   zolpidem (AMBIEN) 5 MG tablet TAKE 1 TABLET(5 MG) BY MOUTH AT BEDTIME AS NEEDED FOR SLEEP 90 tablet 1   No current facility-administered medications on file prior to visit.    Review of Systems:  As per HPI- otherwise negative.   Physical Examination: There were no vitals filed for this visit. There were no vitals filed for this visit. There is no height or weight on file to calculate BMI. Ideal Body Weight:    GEN: no acute distress. HEENT: Atraumatic, Normocephalic.  Ears and Nose: No external deformity. CV: RRR, No M/G/R. No JVD. No thrill. No extra heart sounds. PULM: CTA B, no wheezes, crackles, rhonchi. No retractions. No resp. distress. No accessory muscle use. ABD: S, NT, ND, +BS. No rebound. No HSM. EXTR: No c/c/e PSYCH: Normally interactive. Conversant.    Assessment and Plan: ***  Signed Abbe Amsterdam, MD

## 2023-05-20 ENCOUNTER — Other Ambulatory Visit: Payer: Self-pay | Admitting: Family Medicine

## 2023-05-20 ENCOUNTER — Other Ambulatory Visit (HOSPITAL_COMMUNITY): Payer: Self-pay

## 2023-05-20 ENCOUNTER — Ambulatory Visit: Payer: Medicare Other | Admitting: Family Medicine

## 2023-05-20 ENCOUNTER — Telehealth: Payer: Self-pay | Admitting: Neurology

## 2023-05-20 DIAGNOSIS — G43909 Migraine, unspecified, not intractable, without status migrainosus: Secondary | ICD-10-CM

## 2023-05-20 NOTE — Telephone Encounter (Signed)
Pt called to schedule an appt, but has not had an office visit in 3 years, Informed pt would need a referral. Pt verbalized understand will get a referral from PCP.

## 2023-05-20 NOTE — Telephone Encounter (Signed)
Would this be R10.83?

## 2023-05-21 ENCOUNTER — Other Ambulatory Visit (HOSPITAL_COMMUNITY): Payer: Self-pay

## 2023-05-23 ENCOUNTER — Encounter: Payer: Self-pay | Admitting: Family Medicine

## 2023-05-24 MED ORDER — SPACER/AERO-HOLDING CHAMBERS DEVI: 99 refills | Status: DC

## 2023-05-24 NOTE — Addendum Note (Signed)
Addended by: Abbe Amsterdam C on: 05/24/2023 11:40 AM   Modules accepted: Orders

## 2023-05-25 ENCOUNTER — Ambulatory Visit: Payer: Medicare Other | Attending: Cardiology | Admitting: Cardiology

## 2023-05-25 ENCOUNTER — Encounter: Payer: Self-pay | Admitting: Cardiology

## 2023-05-25 VITALS — BP 99/65 | HR 79 | Resp 16 | Ht 68.0 in | Wt 160.6 lb

## 2023-05-25 DIAGNOSIS — R931 Abnormal findings on diagnostic imaging of heart and coronary circulation: Secondary | ICD-10-CM | POA: Diagnosis not present

## 2023-05-25 DIAGNOSIS — I2510 Atherosclerotic heart disease of native coronary artery without angina pectoris: Secondary | ICD-10-CM | POA: Insufficient documentation

## 2023-05-25 MED ORDER — METOPROLOL SUCCINATE ER 50 MG PO TB24: ORAL_TABLET | ORAL | Status: DC

## 2023-05-25 NOTE — Patient Instructions (Signed)
Medication Instructions:   DECREASE YOUR METOPROLOL SUCCINATE (TOPROL XL) TO TAKING 1/2 TABLET (25 MG TOTAL) BY MOUTH DAILY FOR ONE WEEK, THEN DECREASE TO TAKING 1/2 TABLET (25 MG TOTAL) BY MOUTH EVERY OTHER DAY FOR ONE WEEK, THEN STOP THEREAFTER.  *If you need a refill on your cardiac medications before your next appointment, please call your pharmacy*    Follow-Up: At [facility], you and your health needs are our priority.  As part of our continuing mission to provide you with exceptional heart care, we have created designated Provider Care Teams.  These Care Teams include your primary Cardiologist (physician) and Advanced Practice Providers (APPs -  Physician Assistants and Nurse Practitioners) who all work together to provide you with the care you need, when you need it.  We recommend signing up for the patient portal called "MyChart".  Sign up information is provided on this After Visit Summary.  MyChart is used to connect with patients for Virtual Visits (Telemedicine).  Patients are able to view lab/test results, encounter notes, upcoming appointments, etc.  Non-urgent messages can be sent to your provider as well.   To learn more about what you can do with MyChart, go to [email or web address].    Your next appointment:   1 year(s)  Provider:   DR. Rosemary Holms

## 2023-05-25 NOTE — Progress Notes (Signed)
Cardiology Office Note:  .   Date:  05/25/2023  ID:  Debbie Hayes, DOB [birth year], MRN [id number] PCP: Pearline Cables, MD  [facility] Providers Cardiologist:  Truett Mainland, MD PCP: Pearline Cables, MD  Chief Complaint  Patient presents with   Agatston coronary artery calcium score greater than 400   Follow-up    6 months      History of Present Illness: .    Debbie Hayes is a 72 y.o. female with type 2 DM, controlled hyperlipidemia, elevated CAC   Patient is doing very well.  She does not walk regularly due to back pain, but does not have issues with any chest pain or shortness of breath when she walks in the grocery stores etc.  Minimal bruising noted with aspirin, no other bleeding issues.  Reviewed recent lipid panel and A1c results with the patient, details below.  Blood pressure is low normal today.  Vitals:   05/25/23 1443  BP: 99/65  Pulse: 79  Resp: 16  SpO2: 96%     ROS:  Review of Systems  Cardiovascular:  Negative for chest pain, dyspnea on exertion, leg swelling, palpitations and syncope.     Studies Reviewed: Marland Kitchen       Labs 02/2023: A1C 6.7% LDL 52  Lexiscan Tetrofosmin stress test 11/17/2022: 1 Day Rest/Stress protocol.  Stress EKG is non-diagnostic, as this is pharmacological stress test using Lexiscan. Normal myocardial perfusion without evidence of ischemia or infarct. Left ventricular size is normal, wall thickness is preserved, calculated LVEF 65%. Low risk study.   Physical Exam:   Physical Exam Vitals and nursing note reviewed.  Constitutional:      General: She is not in acute distress. Neck:     Vascular: No JVD.  Cardiovascular:     Rate and Rhythm: Normal rate and regular rhythm.     Heart sounds: Normal heart sounds. No murmur heard. Pulmonary:     Effort: Pulmonary effort is normal.     Breath sounds: Normal breath sounds. No wheezing or rales.  Musculoskeletal:     Right lower leg: No  edema.     Left lower leg: No edema.      VISIT DIAGNOSES:   ICD-10-CM   1. Coronary artery disease involving native coronary artery of native heart without angina pectoris  I25.10     2. Agatston coronary artery calcium score greater than 400  R93.1        ASSESSMENT AND PLAN: .    Debbie Hayes is a 72 y.o. female with type 2 DM, controlled hyperlipidemia, elevated CAC   Elevated CAC: 92nd percentile, including calcium 237 in left main. No ischemia on stress testing (10/2022). Known statin myopathy. Continue Repatha.   Hypertension: Continue losartan 25 mg daily. Currently on metoprolol succinate 50 mg daily.  I will try to wean her off by asking her to take half tab daily for 1 week, every other day for 1 week, then stop/      No orders of the defined types were placed in this encounter.    F/u in 1 year  Signed, Elder Negus, MD

## 2023-06-03 ENCOUNTER — Ambulatory Visit: Payer: Medicare Other | Admitting: Dietician

## 2023-06-09 ENCOUNTER — Encounter: Payer: Self-pay | Admitting: Family Medicine

## 2023-06-17 ENCOUNTER — Other Ambulatory Visit: Payer: Self-pay | Admitting: Family Medicine

## 2023-06-17 ENCOUNTER — Ambulatory Visit: Payer: Medicare Other | Admitting: Family Medicine

## 2023-06-24 ENCOUNTER — Encounter: Payer: Self-pay | Admitting: Family Medicine

## 2023-06-24 DIAGNOSIS — G43909 Migraine, unspecified, not intractable, without status migrainosus: Secondary | ICD-10-CM

## 2023-06-24 MED ORDER — NURTEC 75 MG PO TBDP: ORAL_TABLET | ORAL | 9 refills | Status: DC

## 2023-06-24 NOTE — Telephone Encounter (Signed)
Okay to fill, Rx has "historical provider" on it?

## 2023-07-01 ENCOUNTER — Encounter: Payer: Self-pay | Admitting: Family Medicine

## 2023-07-01 DIAGNOSIS — G8929 Other chronic pain: Secondary | ICD-10-CM

## 2023-07-01 MED ORDER — OXYCODONE HCL 10 MG PO TABS: 10.0000 mg | ORAL_TABLET | Freq: Three times a day (TID) | ORAL | 0 refills | Status: DC | PRN

## 2023-07-03 ENCOUNTER — Ambulatory Visit (INDEPENDENT_AMBULATORY_CARE_PROVIDER_SITE_OTHER): Payer: Medicare Other | Admitting: Sports Medicine

## 2023-07-03 ENCOUNTER — Encounter: Payer: Self-pay | Admitting: Sports Medicine

## 2023-07-03 ENCOUNTER — Ambulatory Visit: Payer: Medicare Other

## 2023-07-03 DIAGNOSIS — Z96651 Presence of right artificial knee joint: Secondary | ICD-10-CM

## 2023-07-03 DIAGNOSIS — S46011A Strain of muscle(s) and tendon(s) of the rotator cuff of right shoulder, initial encounter: Secondary | ICD-10-CM

## 2023-07-03 DIAGNOSIS — M19011 Primary osteoarthritis, right shoulder: Secondary | ICD-10-CM | POA: Insufficient documentation

## 2023-07-03 NOTE — Assessment & Plan Note (Signed)
Several weeks of increasing right shoulder pain localized over the deltoid, impingement signs on exam. Adding shoulder x-rays, rotator cuff conditioning, return in 6 weeks, MR for interventional planning if not better.

## 2023-07-03 NOTE — Progress Notes (Signed)
    Procedures performed today:    None.  Independent interpretation of notes and tests performed by another provider:   None.  Brief History, Exam, Impression, and Recommendations:    History of arthroplasty of right knee 72 year old female, right knee arthroplasty about 15 years ago, had a fall back in November, now having increasing pain lateral aspect of her knee. She has tenderness on the lateral aspect of the femoral component. She also has reproduction of pain with varus stressing of the knee. No ligamentous instability. Adding a hinged knee brace, home PT. X-rays. Return to see me in 6 weeks. We can consider a three-phase bone scan versus injection if not better.  Rotator cuff strain, right, initial encounter Several weeks of increasing right shoulder pain localized over the deltoid, impingement signs on exam. Adding shoulder x-rays, rotator cuff conditioning, return in 6 weeks, MR for interventional planning if not better.    ____________________________________________ Ihor Austin. Benjamin Stain, M.D., ABFM., CAQSM., AME. Primary Care and Sports Medicine [facility]  Adjunct Professor of Family Medicine  [employer]  Restaurant manager, fast food

## 2023-07-03 NOTE — Assessment & Plan Note (Signed)
72 year old female, right knee arthroplasty about 15 years ago, had a fall back in November, now having increasing pain lateral aspect of her knee. She has tenderness on the lateral aspect of the femoral component. She also has reproduction of pain with varus stressing of the knee. No ligamentous instability. Adding a hinged knee brace, home PT. X-rays. Return to see me in 6 weeks. We can consider a three-phase bone scan versus injection if not better.

## 2023-07-06 ENCOUNTER — Encounter: Payer: Self-pay | Admitting: Family Medicine

## 2023-07-06 DIAGNOSIS — J4520 Mild intermittent asthma, uncomplicated: Secondary | ICD-10-CM

## 2023-07-09 ENCOUNTER — Other Ambulatory Visit: Payer: Self-pay | Admitting: Family Medicine

## 2023-07-09 DIAGNOSIS — E782 Mixed hyperlipidemia: Secondary | ICD-10-CM

## 2023-07-12 ENCOUNTER — Other Ambulatory Visit: Payer: Self-pay | Admitting: Family Medicine

## 2023-07-12 DIAGNOSIS — G43709 Chronic migraine without aura, not intractable, without status migrainosus: Secondary | ICD-10-CM

## 2023-07-13 ENCOUNTER — Telehealth: Payer: Self-pay

## 2023-07-13 ENCOUNTER — Encounter: Payer: Self-pay | Admitting: Sports Medicine

## 2023-07-13 NOTE — Telephone Encounter (Signed)
PA initiated.   Debbie Hayes (Key: [id number])

## 2023-07-13 NOTE — Telephone Encounter (Signed)
Yes please have them move up the shoulder and knee x-rays and please let Warden Fillers know that radiology has been way behind due to staffing shortages.

## 2023-07-14 NOTE — Telephone Encounter (Signed)
Crystal with [employer] called to advise that Toparimate 100 called has been approved. Effective dates 07/13/23-07/12/24. Fax will be sent. Pt has been notified.

## 2023-07-14 NOTE — Telephone Encounter (Signed)
Patient informed. 

## 2023-07-14 NOTE — Telephone Encounter (Signed)
Spoke with Elnita Maxwell - she will move x-rays up for reading =kph

## 2023-07-15 ENCOUNTER — Encounter (INDEPENDENT_AMBULATORY_CARE_PROVIDER_SITE_OTHER): Payer: Self-pay | Admitting: Sports Medicine

## 2023-07-15 DIAGNOSIS — S46011A Strain of muscle(s) and tendon(s) of the rotator cuff of right shoulder, initial encounter: Secondary | ICD-10-CM

## 2023-07-16 ENCOUNTER — Telehealth: Payer: Self-pay

## 2023-07-16 MED ORDER — PROAIR RESPICLICK 108 (90 BASE) MCG/ACT IN AEPB: INHALATION_SPRAY | RESPIRATORY_TRACT | 12 refills | Status: AC

## 2023-07-16 MED ORDER — QVAR REDIHALER 80 MCG/ACT IN AERB: 1.0000 | INHALATION_SPRAY | Freq: Two times a day (BID) | RESPIRATORY_TRACT | 12 refills | Status: DC

## 2023-07-16 NOTE — Telephone Encounter (Signed)
A fax was sent over from [facility] stating that a PA is needed for Proair.  MEDICATION Alternatives:  Albuterol HFA (Par): Likely not required Ventolin HFA: Likely not required Levalbuterol HFA: Likely required  Please advise.

## 2023-07-17 NOTE — Telephone Encounter (Signed)
Debbie Hayes (Key: [id number])

## 2023-07-17 NOTE — Telephone Encounter (Signed)
Care team updated and letter resent for eye exam notes sent by pcp office on 06/17/2023.

## 2023-07-19 NOTE — Telephone Encounter (Signed)

## 2023-07-20 NOTE — Telephone Encounter (Signed)
Per fax: PA has been approved. Authorization will end on 07/16/2024

## 2023-07-25 ENCOUNTER — Other Ambulatory Visit: Payer: Self-pay | Admitting: Family Medicine

## 2023-08-06 ENCOUNTER — Encounter: Payer: Self-pay | Admitting: Family Medicine

## 2023-08-14 ENCOUNTER — Ambulatory Visit (INDEPENDENT_AMBULATORY_CARE_PROVIDER_SITE_OTHER): Payer: Medicare Other | Admitting: Sports Medicine

## 2023-08-14 ENCOUNTER — Encounter: Payer: Self-pay | Admitting: Family Medicine

## 2023-08-14 ENCOUNTER — Other Ambulatory Visit: Payer: Self-pay | Admitting: Family Medicine

## 2023-08-14 DIAGNOSIS — M19011 Primary osteoarthritis, right shoulder: Secondary | ICD-10-CM

## 2023-08-14 DIAGNOSIS — Z96651 Presence of right artificial knee joint: Secondary | ICD-10-CM

## 2023-08-14 MED ORDER — FLUTICASONE PROPIONATE HFA 110 MCG/ACT IN AERO: 1.0000 | INHALATION_SPRAY | Freq: Two times a day (BID) | RESPIRATORY_TRACT | 12 refills | Status: DC

## 2023-08-14 NOTE — Assessment & Plan Note (Signed)
Pleasant 73 year old female returns, she had a right knee arthroplasty approximately 15 years ago, she had a fall in November with increasing lateral joint line pain. She had tenderness on the lateral femoral component of the prosthesis, she also reproduction of pain with varus stressing, we added a hinged knee brace, home PT, x-rays were normal, she returns today and is doing better, she continues to improve so we will continue the treatment course, if she plateaus and cannot live with it then we will proceed with three-phase bone scan.

## 2023-08-14 NOTE — Assessment & Plan Note (Signed)
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 She is continue to have pain but has not really been doing her home conditioning, she will get more aggressive with a home conditioning and we can consider an injection at 6 weeks if still hurting, whether we do subacromial or glenohumeral will depend on what symptoms have declared themselves.

## 2023-08-14 NOTE — Progress Notes (Signed)
    Procedures performed today:    None.  Independent interpretation of notes and tests performed by another provider:   None.  Brief History, Exam, Impression, and Recommendations:    History of arthroplasty of right knee Pleasant 72 year old female returns, she had a right knee arthroplasty approximately 15 years ago, she had a fall in November with increasing lateral joint line pain. She had tenderness on the lateral femoral component of the prosthesis, she also reproduction of pain with varus stressing, we added a hinged knee brace, home PT, x-rays were normal, she returns today and is doing better, she continues to improve so we will continue the treatment course, if she plateaus and cannot live with it then we will proceed with three-phase bone scan.  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 She is continue to have pain but has not really been doing her home conditioning, she will get more aggressive with a home conditioning and we can consider an injection at 6 weeks if still hurting, whether we do subacromial or glenohumeral will depend on what symptoms have declared themselves.    ____________________________________________ Ihor Austin. Benjamin Stain, M.D., ABFM., CAQSM., AME. Primary Care and Sports Medicine [facility]  Adjunct Professor of Family Medicine  [employer]  Restaurant manager, fast food

## 2023-09-02 ENCOUNTER — Other Ambulatory Visit: Payer: Self-pay | Admitting: Family Medicine

## 2023-09-02 MED ORDER — CEPHALEXIN 500 MG PO CAPS: 500.0000 mg | ORAL_CAPSULE | Freq: Three times a day (TID) | ORAL | 0 refills | Status: DC

## 2023-09-02 NOTE — Progress Notes (Signed)
 Pt contacted me via cell phone for cellulitis of her little toe Called in keflex  to her [facility] Asked her to let me know if not improving  Meds ordered this encounter  Medications   cephALEXin  (KEFLEX ) 500 MG capsule    Sig: Take 1 capsule (500 mg total) by mouth 3 (three) times daily.    Dispense:  15 capsule    Refill:  0

## 2023-09-08 NOTE — Progress Notes (Unsigned)
[facility] [address] [place], [place] [address] [phone]  Date:  09/09/2023   Name:  Debbie Hayes   DOB:  16-Jan-1951   MRN:  [id number]  PCP:  Pearline Cables, MD    Chief Complaint: lump on the bottom of the foot   History of Present Illness:  Debbie Hayes is a 73 y.o. very pleasant female patient who presents with the following:  Patient seen today with a bump on the sole of her foot- history of migraine headache, sleep apnea, diabetes, hypothyroidism, hyperlipidemia, chronic joint pain with periodic use of narcotic pain medication   Most recent visit with myself was in August  Can update A1c today Urine micro Eye exam Colon cancer screening- she has plans to be seen  Recommend Shingrix Tetanus is due for update  Pt notes that last week she wore some sandals and felt a hard spot in the sole of her left foot She had an infection of her little toe and we treated her with keflex for 5 days starting on 2/5- this worked well and seemed to clear up the infection.  However then she noticed this bump on the bottom of her foot and want to be sure was okay.  Is not especially tender, just feels like something hard or pebble in her shoe  Her neurolgoist is starting her on a new infusion for her HA soon They plan to have her stop her topiramate so we would potentially be able to use metformin again.  Would be a good idea to have her on at least a small amount of metformin so she can get overall benefit from this medication She will let me know when she has tapered off topiramate  She also notes her brother recently had a thyroid biopsy.  We do not know for sure if he has cancer, hopefully he does not.  However I reminded her if her brother turns out to have medullary thyroid carcinoma she will need to come off her GLP-1.  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 Lab Results   Component Value Date   HGBA1C 6.7 (H) 03/23/2023     Patient Active Problem List   Diagnosis Date Noted   History of arthroplasty of right knee 07/03/2023   Primary osteoarthritis of right shoulder 07/03/2023   Coronary artery disease involving native coronary artery of native heart without angina pectoris 05/25/2023   Agatston coronary artery calcium score greater than 400 11/06/2022   Statin myopathy 11/06/2022   Frequent UTI 10/29/2022   Wart of hand 09/30/2022   Acute cystitis without hematuria 05/14/2021   Medial epicondylitis, right 03/25/2021   Trigger thumb, right thumb 11/19/2020   Right wrist pain 10/23/2020   Osteoporosis 04/05/2020   Trigger finger, left middle finger 07/27/2019   Chronic neck pain 11/10/2018   Orthopedic hardware present 02/17/2018   Controlled diabetes mellitus type 2 with complications (HCC) 12/15/2017   Intractable chronic migraine without aura and with status migrainosus 12/15/2017   Arthralgia 12/15/2017   Joint pain 12/15/2017   Migraine, transformed 12/15/2017   Chronic migraine without aura, with intractable migraine, so stated, with status migrainosus 12/15/2017   Pain of left shoulder joint on movement 10/19/2017   OSA (obstructive sleep apnea) 06/26/2017   Snoring 03/25/2016   Palpitations 03/25/2016   Headache 03/25/2016   Sensory disorder of trigeminal nerve 11/26/2015   Spondylolisthesis of lumbar  region 08/09/2014   Lumbar adjacent segment disease with spondylolisthesis 08/09/2014   Back pain 02/17/2014   Macular degeneration 02/17/2014   Backache 02/17/2014   Other and unspecified hyperlipidemia 10/14/2013   Hyperlipidemia 10/14/2013   Onychomycosis 12/01/2012   Arthritis 04/12/2012   Hypothyroidism 04/12/2012    Past Medical History:  Diagnosis Date   Allergy    Cancer (HCC)    thyroid cancer   Cataract    Chronic migraine BOTOX INJECTION EVERY 3 MONTHS   Chronic pain in right shoulder    Constipation    Diabetes  mellitus    Disorder of inner ear CAUSES VERTIGO OCCASIONALLY   Fibromyalgia    GERD (gastroesophageal reflux disease)    Hemorrhoid    History of bladder infections    History of thyroid cancer 2009  S/P TOTAL THYROIDECTOMY AND RADIATION   Hyperlipidemia    Hypertension CARDIOLOGIST- DR Donnie Aho- WILL REQUEST LATE NOTE   DENIES S & S   Hypothyroidism    Insomnia    Left shoulder pain    Macular degeneration    MRSA infection    nasal treated more than 15 years ago   Normal nuclear stress test 01-25-2009   OA (osteoarthritis) JOINTS   Osteopenia    Painful orthopaedic hardware (HCC)    left foot   PONV (postoperative nausea and vomiting) SEVERE   Rotator cuff disorder LEFT SHOULDER RTC IMPINGMENT   Sleep apnea    mouth guard, no cpap    Sleep apnea in adult 06/26/2017   Spondylolisthesis of lumbar region    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   Wears glasses     Past Surgical History:  Procedure Laterality Date   BACK SURGERY     Dr Lovell Sheehan- spondylosis   BILATERAL CARPAL TUNNEL RELEASE  1994   BILATERAL ELBOW SURG.  1999   BILATERAL SALPINGOOPHECTOMY  1993   POST-OP URETER REPAIR 12 DAYS AFTER    CATARACT EXTRACTION, BILATERAL     COLONOSCOPY     EYE SURGERY     FOOT ARTHRODESIS Left 07/10/2016   Procedure: LEFT 2ND TARSAL METATARSAL ARTHRODESIS  GASTROC RECESSION LEFT LAPIDUS MODIFIED MCBRIDE BUIONECTOMY;  Surgeon: Toni Arthurs, MD;  Location: [facility];  Service: Orthopedics;  Laterality: Left;   GASTROC RECESSION EXTREMITY Left 07/10/2016   Procedure: GASTROC RECESSION EXTREMITY;  Surgeon: Toni Arthurs, MD;  Location: [facility];  Service: Orthopedics;  Laterality: Left;   HARDWARE REMOVAL Left 05/20/2018   Procedure: Left foot removal of deep implants;  Surgeon: Toni Arthurs, MD;  Location: [facility];  Service: Orthopedics;  Laterality: Left;    LEFT THUMB JOINT REPLACEMENT  2005   RIGHT DONE IN 2004    OOPHORECTOMY     POLYPECTOMY     RIGHT KNEE ARTHROSCOPY  X2  BEFORE 2011   RIGHT KNEE ARTHROSCOPY/ PARTIAL LATERAL MENISECTOMY/ TRICOMPARTMENT CHONDROPLASTY/ DECOMPRESSION CYST  10-26-2009   RIGHT KNEE CLOSED MANIPULATION  09-11-2010   RIGHT SHOULDER ARTHROSCOPY  2010  &  2004   TIBIA CYST REMOVED AND ORIF LEG FX  1958   TONSILLECTOMY  1968   TOTAL KNEE ARTHROPLASTY  07-16-2010   RIGHT   TOTAL THYROIDECTOMY  2009   CANCER  (POST-OP BLEED)  AND RADIATION TX   trigger fingers Right 2013   3rd and 4th fingers   UPPER GASTROINTESTINAL ENDOSCOPY     VAGINAL HYSTERECTOMY  1990    Social History   Tobacco Use   Smoking  status: Never   Smokeless tobacco: Never  Vaping Use   Vaping status: Never Used  Substance Use Topics   Alcohol use: Yes    Comment: RARE   Drug use: No    Family History  Problem Relation Age of Onset   Colon polyps Sister 23   Alzheimer's disease Mother    Heart disease Father    Cirrhosis Father    Heart attack Father    Macular degeneration Father    Colon cancer Neg Hx     Allergies  Allergen Reactions   Sulfa Antibiotics Hives   Statins Other (See Comments)    Pt has tried multiple and cannot tolerate    Medication list has been reviewed and updated.  Current Outpatient Medications on File Prior to Visit  Medication Sig Dispense Refill   acetaminophen (TYLENOL) 500 MG tablet Take 500 mg by mouth every 6 (six) hours as needed.     albuterol (VENTOLIN HFA) 108 (90 Base) MCG/ACT inhaler Inhale 2 puffs into the lungs every 6 (six) hours as needed for wheezing or shortness of breath. 2 each 2   Albuterol Sulfate (PROAIR RESPICLICK) 108 (90 Base) MCG/ACT AEPB Inhale 2 puffs every 4-6 hours as needed for wheezing or shortness of breath 1 each 12   alendronate (FOSAMAX) 70 MG tablet Take 1 tablet (70 mg total) by mouth every 7 (seven) days. Take with a full glass of water on an empty stomach. 4 tablet 11   aspirin EC 81 MG tablet Take 81 mg by mouth  daily. Swallow whole.     Atogepant (QULIPTA) 60 MG TABS Take 1 tablet (60 mg total) by mouth daily. 30 tablet 0   blood glucose meter kit and supplies KIT Test blood sugar once daily. Dx code: E11.9 1 each 0   Blood Glucose Monitoring Suppl (FREESTYLE LITE) DEVI Check blood sugar no more than twice daily 1 each 0   Blood Glucose Monitoring Suppl (ONE TOUCH ULTRA 2) w/Device KIT Dispense one meter and test strips, lancets- 100 of each with PRN RF 1 kit 0   cephALEXin (KEFLEX) 500 MG capsule Take 1 capsule (500 mg total) by mouth 3 (three) times daily. 15 capsule 0   Cranberry 125 MG TABS Take by mouth.     dapagliflozin propanediol (FARXIGA) 10 MG TABS tablet TAKE 1 TABLET(10 MG) BY MOUTH DAILY 90 tablet 0   dicyclomine (BENTYL) 20 MG tablet Take 1 tablet (20 mg total) by mouth 4 (four) times daily as needed for spasms. 60 tablet 1   Evolocumab (REPATHA SURECLICK) 140 MG/ML SOAJ Inject 140 mg into the skin every 14 (fourteen) days. 6 mL 0   Fexofenadine HCl (ALLEGRA PO) Take by mouth.     fluticasone (FLOVENT HFA) 110 MCG/ACT inhaler Inhale 1-2 puffs into the lungs 2 (two) times daily. 1 each 12   glucose blood (FREESTYLE LITE) test strip CHECK BLOOD SUGARS NO MORE THAN TWICE DAILY 100 each 12   lactobacillus acidophilus (BACID) TABS tablet Take 2 tablets by mouth 3 (three) times daily.     Lancets (FREESTYLE) lancets Check blood sugars no more than twice daily 100 each 12   levothyroxine (SYNTHROID) 125 MCG tablet TAKE 1 TABLET BY MOUTH BEFORE BREAKFAST 90 tablet 3   losartan (COZAAR) 25 MG tablet Take 1 tablet (25 mg total) by mouth daily. 90 tablet 3   Magnesium 400 MG CAPS Take by mouth.     metoprolol succinate (TOPROL-XL) 50 MG 24 hr tablet Take 1  tablet (50 mg total) by mouth daily. Take with or immediately following a meal (Patient taking differently: Take 25 mg by mouth daily. Take with or immediately following a meal) 90 tablet 1   montelukast (SINGULAIR) 10 MG tablet Take 1 tablet (10  mg total) by mouth at bedtime. 90 tablet 1   ondansetron (ZOFRAN-ODT) 8 MG disintegrating tablet Take 8 mg by mouth every 8 (eight) hours as needed for nausea or vomiting.     Oxycodone HCl 10 MG TABS Take 1-1.5 tablets (10-15 mg total) by mouth every 8 (eight) hours as needed. 60 tablet 0   Polyethylene Glycol 3350 (MIRALAX PO) Take by mouth.     Pseudoephedrine-Guaifenesin (MUCINEX D MAX STRENGTH) 613 501 8417 MG TB12 Take 1 tablet by mouth in the morning and at bedtime.     psyllium (METAMUCIL) 58.6 % powder Take 1 packet by mouth 3 (three) times daily.     Rimegepant Sulfate (NURTEC) 75 MG TBDP Take 1 tablet by mouth as needed (pain).     Rimegepant Sulfate (NURTEC) 75 MG TBDP Use one daily as needed for migraine headache 30 tablet 9   Semaglutide,0.25 or 0.5MG /DOS, (OZEMPIC, 0.25 OR 0.5 MG/DOSE,) 2 MG/3ML SOPN Inject 0.5 mg into the skin once a week. 9 mL 2   Topiramate ER (TROKENDI XR) 100 MG CP24 TAKE 1 CAPSULE BY MOUTH DAILY 90 capsule 3   traZODone (DESYREL) 100 MG tablet TAKE 2 AND 1/2 TABLETS(250 MG) BY MOUTH AT BEDTIME 225 tablet 1   zolpidem (AMBIEN) 5 MG tablet TAKE 1 TABLET(5 MG) BY MOUTH AT BEDTIME AS NEEDED FOR SLEEP 90 tablet 0   No current facility-administered medications on file prior to visit.    Review of Systems:  As per HPI- otherwise negative.   Physical Examination: Vitals:   09/09/23 1524  BP: 118/70  Pulse: 96  Resp: 18  Temp: 98.3 F (36.8 C)  SpO2: 98%   Vitals:   09/09/23 1524  Weight: 163 lb (73.9 kg)  Height: 5\' 8"  (1.727 m)   Body mass index is 24.78 kg/m. Ideal Body Weight: Weight in (lb) to have BMI = 25: 164.1  GEN: No acute distress; alert,appropriate. PULM: Breathing comfortably in no respiratory distress PSYCH: Normally interactive.    Left foot: Infection of little toe has cleared up.  Area of concern on the plantar aspect of the left foot adjacent to the distant fifth metatarsal appears to be a callus.  At this time I do not see  evidence of a plantar wart.  Otherwise foot exam is normal, no other redness or swelling   Assessment and Plan: Diabetes mellitus type 2 with complications (HCC) - Plan: Hemoglobin A1c, Basic metabolic panel  Callus of foot  Mixed hyperlipidemia - Plan: Lipid panel  Screening for deficiency anemia - Plan: CBC  Patient seen today for follow-up.  Reassured that the finding on the sole of her foot seems to be a callus.  She can gently file this down at home or get a professional pedicure.  She will let me know if this does not seem to resolving or any other concerns Follow-up on diabetes control today.  Once she is tapered off of her topiramate we can start on some metformin  Signed Abbe Amsterdam, MD  Received labs 2/13- message to pt  Results for orders placed or performed in visit on 09/09/23  Hemoglobin A1c   Collection Time: 09/09/23  3:55 PM  Result Value Ref Range   Hgb A1c MFr Bld 7.2 (  H) 4.6 - 6.5 %  Basic metabolic panel   Collection Time: 09/09/23  3:55 PM  Result Value Ref Range   Sodium 139 135 - 145 mEq/L   Potassium 3.7 3.5 - 5.1 mEq/L   Chloride 103 96 - 112 mEq/L   CO2 25 19 - 32 mEq/L   Glucose, Bld 148 (H) 70 - 99 mg/dL   BUN 15 6 - 23 mg/dL   Creatinine, Ser 1.61 0.40 - 1.20 mg/dL   GFR 09.60 >45.40 mL/min   Calcium 8.5 8.4 - 10.5 mg/dL  Lipid panel   Collection Time: 09/09/23  3:55 PM  Result Value Ref Range   Cholesterol 163 0 - 200 mg/dL   Triglycerides 981.1 (H) 0.0 - 149.0 mg/dL   HDL 91.47 >82.95 mg/dL   VLDL 62.1 0.0 - 30.8 mg/dL   LDL Cholesterol 61 0 - 99 mg/dL   Total CHOL/HDL Ratio 3    NonHDL 99.61   CBC   Collection Time: 09/09/23  3:55 PM  Result Value Ref Range   WBC 7.2 4.0 - 10.5 K/uL   RBC 5.17 (H) 3.87 - 5.11 Mil/uL   Platelets 207.0 150.0 - 400.0 K/uL   Hemoglobin 15.1 (H) 12.0 - 15.0 g/dL   HCT 65.7 84.6 - 96.2 %   MCV 88.3 78.0 - 100.0 fl   MCHC 33.0 30.0 - 36.0 g/dL   RDW 95.2 84.1 - 32.4 %   *Note: Due to a large  number of results and/or encounters for the requested time period, some results have not been displayed. A complete set of results can be found in Results Review.

## 2023-09-09 ENCOUNTER — Ambulatory Visit: Payer: Medicare Other | Admitting: Neurology

## 2023-09-09 ENCOUNTER — Ambulatory Visit (INDEPENDENT_AMBULATORY_CARE_PROVIDER_SITE_OTHER): Payer: Medicare Other | Admitting: Family Medicine

## 2023-09-09 ENCOUNTER — Telehealth: Payer: Self-pay | Admitting: Neurology

## 2023-09-09 ENCOUNTER — Encounter: Payer: Self-pay | Admitting: Neurology

## 2023-09-09 VITALS — BP 118/70 | HR 96 | Temp 98.3°F | Resp 18 | Ht 68.0 in | Wt 163.0 lb

## 2023-09-09 VITALS — BP 114/63 | HR 68 | Ht 68.0 in | Wt 163.0 lb

## 2023-09-09 DIAGNOSIS — E782 Mixed hyperlipidemia: Secondary | ICD-10-CM | POA: Diagnosis not present

## 2023-09-09 DIAGNOSIS — L84 Corns and callosities: Secondary | ICD-10-CM

## 2023-09-09 DIAGNOSIS — Z130 Encounter for screening for diseases of the blood and blood-forming organs and certain disorders involving the immune mechanism: Secondary | ICD-10-CM

## 2023-09-09 DIAGNOSIS — E118 Type 2 diabetes mellitus with unspecified complications: Secondary | ICD-10-CM

## 2023-09-09 DIAGNOSIS — G43711 Chronic migraine without aura, intractable, with status migrainosus: Secondary | ICD-10-CM

## 2023-09-09 MED ORDER — QULIPTA 60 MG PO TABS: 60.0000 mg | ORAL_TABLET | Freq: Every day | ORAL | 0 refills | Status: DC

## 2023-09-09 NOTE — Telephone Encounter (Signed)
Patient with chronic migraines failed multiple medications over the last 6 months > 10 total moderate to severe migraines with 2-25 total montly headaches.  failed Emgality/Ajovy/Aimovig and botox for migraines 5 injections, never tried vyepti(tried qulipta slight benefit not significant) Start Vyepti for preventative   G43.711  START VYEPTI PROROCOL  Migraine preventative meds tried and failed > 3 months: Botox 5 injections (also tried this in the past), Emgality/Ajovy/Aimovig, Did not tolerate Topiramate IR and ER lost effectiveness, metoprolol, atenolol, depakote, effexor, and multiple other first, second and third line preventatives and acute medications over the many years of migraines, imitrex, maxalt and other triptans, ubrelvy, nurtec, has been to [facility] in the past and other physicians/neurologists. Tried depakote, zonegran.   Start at 100mg  unless this insurance allows 300 to start

## 2023-09-09 NOTE — Addendum Note (Signed)
Addended by: Naomie Dean B on: 09/09/2023 12:54 PM   Modules accepted: Level of Service

## 2023-09-09 NOTE — Telephone Encounter (Signed)
Spoke with Dr Lucia Gaskins. Will start with 100 mg then can increase to 300 mg if needed later. Vyepti 100 mg IV every 3 months order sheet signed.

## 2023-09-09 NOTE — Progress Notes (Addendum)
 [facility]    Provider:  Dr Lucia Gaskins Requesting Provider: Patsy Lager, Gwenlyn Found, MD Primary Care Provider:  Pearline Cables, MD  CC:  Migraine  Last time we saw her she was doing well. Over the last 3 months having 12-16 migraine days a month and a total of 20-25 total headache days a month They that are pulsating/pounding/throbbing, 98% in the left temples, significant light sensitivity also gets lights nausea mostly in the past, photophobia/phonophobia, moderate to severe mograines, no aura,no medication overuse, hurts to move, helps in a dark room, light worsening and exquisite light sensitivy has to wear a visor on the left side of the head and even during church due to the lighting, she is suffering, she gave up citrus, nitrites, watched her diet, gave up any trigers, watching diet, its debilitating her life, they can last 24 hours but can last all day, she wears a mouth guard and she was tested with the mouthguard and is compliant,   failed Emgality/Ajovy/Aimovig and botox for migraines 5 injections, never tried vyepti(qulipta also possibly but feel vyepti would be morst effective), qulipta have tried will give her samples and stop when vyepti approved She would like a referral to [facility] headache center we sent referral 10/2018 she states she never went Start Vyepti for preventative - have started approval proess - she was never infusion  Topiramate XR - had worked, not working anymore, cannot tolerate increase Arts development officer for acute management and given nurtec helps but not always NO medication overuse Has had sleep evaluation/study and is compliant MRI brain normal  Continue to follow for for sleep Continue Skelaxone for neck pain didn't help Sleeping difficulty (takes, ambien, trazodone) Takes nurtec.   Migraine preventative meds tried and failed > 3 months: Botox 5 injections (also tried this in the past), Emgality/Ajovy/Aimovig, Did not tolerate Topiramate IR and ER  lost effectiveness, metoprolol, atenolol, depakote, effexor, and multiple other first, second and third line preventatives and acute medications over the many years of migraines, imitrex, maxalt and other triptans, ubrelvy, nurtec, [facility] and other physicians/neurologists. Tried depakote, zonegran.  Reviewed images and agree:  CLINICAL DATA:  Initial evaluation for left-sided facial tingling. History of migraines.  EXAM: MRI HEAD WITHOUT CONTRAST  MRA HEAD WITHOUT CONTRAST  MRA NECK WITHOUT AND WITH CONTRAST  ...        MR Angiogram Neck W Wo Contrast Final result 10/24/2015 12:05 AM    Narrative  CLINICAL DATA:  Initial evaluation for left-sided facial tingling. History of migraines.  EXAM: MRI HEAD WITHOUT CONTRAST  MRA HEAD WITHOUT CONTRAST  MRA NECK WITHOUT AND WITH CONTRAST     CLINICAL DATA:  Initial evaluation for left-sided facial tingling.  History of migraines.   EXAM:  MRI HEAD WITHOUT CONTRAST   MRA HEAD WITHOUT CONTRAST   MRA NECK WITHOUT AND WITH CONTRAST   Reviewed bloodowrk     Latest Ref Rng & Units 09/09/2023    3:55 PM 03/23/2023    3:49 PM 08/07/2022    3:55 PM  CBC  WBC 4.0 - 10.5 K/uL 7.2  6.1  7.1   Hemoglobin 12.0 - 15.0 g/dL 16.1  09.6  04.5   Hematocrit 36.0 - 46.0 % 45.7  45.0  45.6   Platelets 150.0 - 400.0 K/uL 207.0  212.0  193.0        Latest Ref Rng & Units 09/09/2023    3:55 PM 03/23/2023  3:49 PM 08/07/2022    3:55 PM  CMP  Glucose 70 - 99 mg/dL 409  811  914   BUN 6 - 23 mg/dL 15  16  16    Creatinine 0.40 - 1.20 mg/dL 7.82  9.56  2.13   Sodium 135 - 145 mEq/L 139  141  139   Potassium 3.5 - 5.1 mEq/L 3.7  3.7  3.6   Chloride 96 - 112 mEq/L 103  105  104   CO2 19 - 32 mEq/L 25  27  29    Calcium 8.4 - 10.5 mg/dL 8.5  9.0  9.1   Total Protein 6.0 - 8.3 g/dL   6.8   Total Bilirubin 0.2 - 1.2 mg/dL   0.3   Alkaline Phos 39 - 117 U/L   66   AST 0 - 37 U/L   20   ALT 0 - 35 U/L    18      HPI 02/22/2019:  Debbie Hayes is a 73 y.o. female here as requested by Copland, Gwenlyn Found, MD for migraines. SHE IS DOING EXCEPTIONALLY WELL on Topiramate XR. She has not used her triptans. Extremely well. She is doing well with sleep apnea, using a dental device, she is not snoring.     Last visit April 2020:   Patient is not doing well. She is having 15-18 migraine days a month. We will stop botox. She has failed all three CGRPs. She has chronic pain and takes oxycodone sparingly. She has chronic neck pain and take skelaxone. She has sleep-related headaches and snoring and has followed with Dr. Zipporah Plants, our sleep specialist and had sleep study.  failed Emgality/Ajovy/Aimovig She would like a referral to [facility] headache center Start Vyepti for preventative - have started approval proess Start Topiramate XR - she does not remember what happened when she tried Topiramate it was years ago will try again [place] for acute management Start topiramate ER  NO medication overuse Has had sleep evaluation/study MRI brain normal Continue to follow with Dr. Vickey Huger for sleep Continue Skelaxone for neck pain  Migraine preventative meds tried and failed: Botox 5 injections (also tried this in the past), Emgality/Ajovy/Aimovig, Did not tolerate Topiramate IR in the past will try ER, metoprolol, atenolol, depakote, effexor, and multiple other first, second and third line preventatives and acute medications over the many years of migraines, [facility] and other physicians/neurologists. She has not been on Depakote or Zonegran, unsure of the TCAs.  No orders of the defined types were placed in this encounter.  No orders of the defined types were placed in this encounter.  A total of 25 minutes was spent face-to-face with this patient. Over half this time was spent on counseling patient on the  No diagnosis found. diagnosis and different diagnostic  and therapeutic options, counseling and coordination of care, risks ans benefits of management, compliance, or risk factor reduction and education.  This does not inclde time spent on botox procedure.   Review of Systems: Patient complains of symptoms per HPI as well as the following symptoms: migraine. Pertinent negatives and positives per HPI. All others negative.   Social History   Socioeconomic History   Marital status: Married    Spouse name: Not on file   Number of children: 4   Years of education: Not on file   Highest education level: Bachelor's degree (e.g., BA, AB, BS)  Occupational History    Comment: retired Charity fundraiser  Tobacco Use   Smoking status: Never   Smokeless tobacco: Never  Vaping Use   Vaping status: Never Used  Substance and Sexual Activity   Alcohol use: Yes    Comment: RARE   Drug use: No   Sexual activity: Not on file  Other Topics Concern   Not on file  Social History Narrative   Lives with husband Dr Janace Hoard.   They have 2 adult children that live out of state.   Social Drivers of Corporate investment banker Strain: Low Risk  (06/14/2023)   Overall Financial Resource Strain (CARDIA)    Difficulty of Paying Living Expenses: Not hard at all  Food Insecurity: No Food Insecurity (06/14/2023)   Hunger Vital Sign    Worried About Running Out of Food in the Last Year: Never true    Ran Out of Food in the Last Year: Never true  Transportation Needs: No Transportation Needs (06/14/2023)   PRAPARE - Administrator, Civil Service (Medical): No    Lack of Transportation (Non-Medical): No  Physical Activity: Inactive (06/14/2023)   Exercise Vital Sign    Days of Exercise per Week: 0 days    Minutes of Exercise per Session: 0 min  Stress: No Stress Concern Present (06/14/2023)   [employer] of Occupational Health - Occupational Stress Questionnaire    Feeling of Stress : Only a little  Social Connections: Socially Integrated  (06/14/2023)   Social Connection and Isolation Panel [NHANES]    Frequency of Communication with Friends and Family: More than three times a week    Frequency of Social Gatherings with Friends and Family: More than three times a week    Attends Religious Services: More than 4 times per year    Active Member of [employer] or Organizations: Yes    Attends Engineer, structural: More than 4 times per year    Marital Status: Married  Catering manager Violence: Not At Risk (01/15/2022)   Humiliation, Afraid, Rape, and Kick questionnaire    Fear of Current or Ex-Partner: No    Emotionally Abused: No    Physically Abused: No    Sexually Abused: No    Family History  Problem Relation Age of Onset   Alzheimer's disease Mother    Heart disease Father    Cirrhosis Father    Heart attack Father    Macular degeneration Father    Colon cancer Neg Hx    Esophageal cancer Neg Hx    Rectal cancer Neg Hx    Stomach cancer Neg Hx    Colon polyps Neg Hx     Past Medical History:  Diagnosis Date   Allergy    Asthma    Cancer (HCC)    thyroid cancer   Cataract    Chronic migraine BOTOX INJECTION EVERY 3 MONTHS   Chronic pain in right shoulder    Constipation    Diabetes mellitus    Disorder of inner ear CAUSES VERTIGO OCCASIONALLY   Fibromyalgia    GERD (gastroesophageal reflux disease)    Hemorrhoid    History of bladder infections    History of thyroid cancer 2009  S/P TOTAL THYROIDECTOMY AND RADIATION   Hyperlipidemia    Hypertension CARDIOLOGIST- DR Donnie Aho- WILL REQUEST LATE NOTE   DENIES S & S   Hypothyroidism    Insomnia    Left shoulder pain    Macular degeneration    MRSA infection    nasal treated more than 15  years ago   Normal nuclear stress test 01/25/2009   OA (osteoarthritis) JOINTS   Osteopenia    Osteoporosis    Painful orthopaedic hardware (HCC)    left foot   PONV (postoperative nausea and vomiting) SEVERE   Rotator cuff disorder LEFT SHOULDER RTC  IMPINGMENT   Sleep apnea    mouth guard, no cpap    Sleep apnea in adult 06/26/2017   Spondylolisthesis of lumbar region    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   Wears glasses     Patient Active Problem List   Diagnosis Date Noted   Primary osteoarthritis of left knee 09/23/2023   History of arthroplasty of right knee 07/03/2023   Primary osteoarthritis of right shoulder 07/03/2023   Coronary artery disease involving native coronary artery of native heart without angina pectoris 05/25/2023   Agatston coronary artery calcium score greater than 400 11/06/2022   Statin myopathy 11/06/2022   Frequent UTI 10/29/2022   Wart of hand 09/30/2022   Acute cystitis without hematuria 05/14/2021   Medial epicondylitis, right 03/25/2021   Trigger thumb, right thumb 11/19/2020   Right wrist pain 10/23/2020   Osteoporosis 04/05/2020   Trigger finger, left middle finger 07/27/2019   Chronic neck pain 11/10/2018   Orthopedic hardware present 02/17/2018   Controlled diabetes mellitus type 2 with complications (HCC) 12/15/2017   Intractable chronic migraine without aura and with status migrainosus 12/15/2017   Arthralgia 12/15/2017   Joint pain 12/15/2017   Migraine, transformed 12/15/2017   Chronic migraine without aura, with intractable migraine, so stated, with status migrainosus 12/15/2017   Pain of left shoulder joint on movement 10/19/2017   OSA (obstructive sleep apnea) 06/26/2017   Snoring 03/25/2016   Palpitations 03/25/2016   Headache 03/25/2016   Sensory disorder of trigeminal nerve 11/26/2015   Spondylolisthesis of lumbar region 08/09/2014   Lumbar adjacent segment disease with spondylolisthesis 08/09/2014   Back pain 02/17/2014   Macular degeneration 02/17/2014   Backache 02/17/2014   Other and unspecified hyperlipidemia 10/14/2013   Hyperlipidemia 10/14/2013   Onychomycosis 12/01/2012   Arthritis 04/12/2012   Hypothyroidism 04/12/2012    Past Surgical History:   Procedure Laterality Date   BACK SURGERY     Dr Lovell Sheehan- spondylosis   BILATERAL CARPAL TUNNEL RELEASE  1994   BILATERAL ELBOW SURG.  1999   BILATERAL SALPINGOOPHECTOMY  1993   POST-OP URETER REPAIR 12 DAYS AFTER    CATARACT EXTRACTION, BILATERAL     COLONOSCOPY     EYE SURGERY     FOOT ARTHRODESIS Left 07/10/2016   Procedure: LEFT 2ND TARSAL METATARSAL ARTHRODESIS  GASTROC RECESSION LEFT LAPIDUS MODIFIED MCBRIDE BUIONECTOMY;  Surgeon: Toni Arthurs, MD;  Location: [facility];  Service: Orthopedics;  Laterality: Left;   GASTROC RECESSION EXTREMITY Left 07/10/2016   Procedure: GASTROC RECESSION EXTREMITY;  Surgeon: Toni Arthurs, MD;  Location: [facility];  Service: Orthopedics;  Laterality: Left;   HARDWARE REMOVAL Left 05/20/2018   Procedure: Left foot removal of deep implants;  Surgeon: Toni Arthurs, MD;  Location: [facility];  Service: Orthopedics;  Laterality: Left;    LEFT THUMB JOINT REPLACEMENT  2005   RIGHT DONE IN 2004   OOPHORECTOMY     POLYPECTOMY     RIGHT KNEE ARTHROSCOPY  X2  BEFORE 2011   RIGHT KNEE ARTHROSCOPY/ PARTIAL LATERAL MENISECTOMY/ TRICOMPARTMENT CHONDROPLASTY/ DECOMPRESSION CYST  10-26-2009   RIGHT KNEE CLOSED MANIPULATION  09-11-2010   RIGHT SHOULDER ARTHROSCOPY  2010  &  2004   TIBIA CYST REMOVED AND ORIF LEG FX  1958   TONSILLECTOMY  1968   TOTAL KNEE ARTHROPLASTY  07-16-2010   RIGHT   TOTAL THYROIDECTOMY  2009   CANCER  (POST-OP BLEED)  AND RADIATION TX   trigger fingers Right 2013   3rd and 4th fingers   UPPER GASTROINTESTINAL ENDOSCOPY     VAGINAL HYSTERECTOMY  1990    Current Outpatient Medications  Medication Sig Dispense Refill   acetaminophen (TYLENOL) 500 MG tablet Take 500 mg by mouth every 6 (six) hours as needed.     albuterol (VENTOLIN HFA) 108 (90 Base) MCG/ACT inhaler Inhale 2 puffs into the lungs every 6 (six) hours as needed for wheezing or shortness of breath. 2 each 2   Albuterol  Sulfate (PROAIR RESPICLICK) 108 (90 Base) MCG/ACT AEPB Inhale 2 puffs every 4-6 hours as needed for wheezing or shortness of breath 1 each 12   alendronate (FOSAMAX) 70 MG tablet Take 1 tablet (70 mg total) by mouth every 7 (seven) days. Take with a full glass of water on an empty stomach. 4 tablet 11   aspirin EC 81 MG tablet Take 81 mg by mouth daily. Swallow whole.     Atogepant (QULIPTA) 60 MG TABS Take 1 tablet (60 mg total) by mouth daily. (Patient not taking: Reported on 10/09/2023) 30 tablet 0   blood glucose meter kit and supplies KIT Test blood sugar once daily. Dx code: E11.9 1 each 0   Blood Glucose Monitoring Suppl (FREESTYLE LITE) DEVI Check blood sugar no more than twice daily 1 each 0   Blood Glucose Monitoring Suppl (ONE TOUCH ULTRA 2) w/Device KIT Dispense one meter and test strips, lancets- 100 of each with PRN RF 1 kit 0   Cranberry 125 MG TABS Take by mouth.     dapagliflozin propanediol (FARXIGA) 10 MG TABS tablet TAKE 1 TABLET(10 MG) BY MOUTH DAILY 90 tablet 0   dicyclomine (BENTYL) 20 MG tablet Take 1 tablet (20 mg total) by mouth 4 (four) times daily as needed for spasms. 60 tablet 1   Fexofenadine HCl (ALLEGRA PO) Take by mouth.     fluticasone (FLOVENT HFA) 110 MCG/ACT inhaler Inhale 1-2 puffs into the lungs 2 (two) times daily. 1 each 12   glucose blood (FREESTYLE LITE) test strip CHECK BLOOD SUGARS NO MORE THAN TWICE DAILY 100 each 12   lactobacillus acidophilus (BACID) TABS tablet Take 2 tablets by mouth 3 (three) times daily.     Lancets (FREESTYLE) lancets Check blood sugars no more than twice daily 100 each 12   levothyroxine (SYNTHROID) 125 MCG tablet TAKE 1 TABLET BY MOUTH BEFORE BREAKFAST 90 tablet 3   losartan (COZAAR) 25 MG tablet Take 1 tablet (25 mg total) by mouth daily. 90 tablet 3   Magnesium 400 MG CAPS Take by mouth.     metoprolol succinate (TOPROL-XL) 50 MG 24 hr tablet Take 1 tablet (50 mg total) by mouth daily. Take with or immediately following a  meal (Patient taking differently: Take 25 mg by mouth daily. Take with or immediately following a meal) 90 tablet 1   ondansetron (ZOFRAN-ODT) 8 MG disintegrating tablet Take 8 mg by mouth every 8 (eight) hours as needed for nausea or vomiting.     Oxycodone HCl 10 MG TABS Take 1-1.5 tablets (10-15 mg total) by mouth every 8 (eight) hours as needed. 60 tablet 0   Polyethylene Glycol 3350 (MIRALAX PO) Take by mouth.     Rimegepant  Sulfate (NURTEC) 75 MG TBDP Take 1 tablet by mouth as needed (pain).     Rimegepant Sulfate (NURTEC) 75 MG TBDP Use one daily as needed for migraine headache (Patient not taking: Reported on 10/09/2023) 30 tablet 9   Semaglutide,0.25 or 0.5MG /DOS, (OZEMPIC, 0.25 OR 0.5 MG/DOSE,) 2 MG/3ML SOPN Inject 0.5 mg into the skin once a week. 9 mL 2   sodium chloride 0.9 % SOLN 100 mL with Eptinezumab-jjmr 100 MG/ML SOLN 100 mg Inject 100 mg into the vein every 3 (three) months. 100 mg 5   Topiramate ER (TROKENDI XR) 100 MG CP24 TAKE 1 CAPSULE BY MOUTH DAILY (Patient not taking: Reported on 10/09/2023) 90 capsule 3   zolpidem (AMBIEN) 5 MG tablet TAKE 1 TABLET(5 MG) BY MOUTH AT BEDTIME AS NEEDED FOR SLEEP 90 tablet 0   aspirin 81 MG chewable tablet Chew by mouth daily.     Atogepant (QULIPTA) 60 MG TABS Take 1 tablet (60 mg total) by mouth daily. 90 tablet 3   Evolocumab (REPATHA SURECLICK) 140 MG/ML SOAJ Inject 140 mg into the skin every 14 (fourteen) days. 6 mL 0   montelukast (SINGULAIR) 10 MG tablet Take 1 tablet (10 mg total) by mouth at bedtime. 90 tablet 3   Pseudoephedrine-Guaifenesin (MUCINEX D MAX STRENGTH) [phone] MG TB12 Take 1 tablet by mouth in the morning and at bedtime. (Patient not taking: Reported on 10/09/2023)     psyllium (METAMUCIL) 58.6 % powder Take 1 packet by mouth 3 (three) times daily. (Patient not taking: Reported on 10/09/2023)     Topiramate ER (TROKENDI XR) 50 MG CP24 Take 1 capsule (50 mg total) by mouth at bedtime. 30 capsule 3   traZODone (DESYREL) 100  MG tablet TAKE 2 AND 1/2 TABLETS(250 MG) BY MOUTH AT BEDTIME 225 tablet 1   No current facility-administered medications for this visit.    Allergies as of 09/09/2023 - Review Complete 09/09/2023  Allergen Reaction Noted   Sulfa antibiotics Hives 09/05/2011   Statins Other (See Comments) 11/09/2017    Vitals: BP 114/63 (BP Location: Right Arm, Patient Position: Sitting, Cuff Size: Small)   Pulse 68   Ht 5\' 8"  (1.727 m)   Wt 163 lb (73.9 kg)   BMI 24.78 kg/m  Last Weight:  Wt Readings from Last 1 Encounters:  10/09/23 158 lb (71.7 kg)   Last Height:   Ht Readings from Last 1 Encounters:  10/09/23 5\' 8"  (1.727 m)    Physical exam: Exam: Gen: NAD, conversant      CV: No palpitations or chest pain or SOB. VS: Breathing at a normal rate. Weight appears within normal limits. Not febrile. Eyes: Conjunctivae clear without exudates or hemorrhage  Neuro: Detailed Neurologic Exam  Speech:    Speech is normal; fluent and spontaneous with normal comprehension.  Cognition:    The patient is oriented to person, place, and time;     recent and remote memory intact;     language fluent;     normal attention, concentration, fund of knowledge Cranial Nerves:    The pupils are equal, round, and reactive to light. Visual fields are full Extraocular movements are intact.  The face is symmetric with normal sensation. The palate elevates in the midline. Hearing intact. Voice is normal. Shoulder shrug is normal. The tongue has normal motion without fasciculations.   Coordination: normal  Gait:    No abnormalities noted or reported  Motor Observation:   no involuntary movements noted. Tone:    Appears normal  Posture:    Posture is normal. normal erect    Strength:    Strength is anti-gravity and symmetric in the upper and lower limbs.      Sensation: intact to LT, no reports of numbness or tingling or paresthesias       Assessment/Plan:  Patient with chronic migraines  failed multiple medications over the last 6 months > 10 total moderate to severe migraines with 2-25 total montly headaches.  failed Emgality/Ajovy/Aimovig and botox for migraines 5 injections, never tried vyepti(tried qulipta slight benefir will give samples to brige), qulipta have tried will give her samples and stop when vyepti approved referral to [facility] center we sent referral 10/2018 she states she never went Start Vyepti for preventative  Topiramate - had worked, not working anymore, cannot tolerate increase, will titrate off when on [facility] for acute management didn't help and given nurtec helps but not always NO medication overuse, no aura Has had sleep evaluation/study and is compliant MRI brain normal in the past Continue to follow for for sleep [facility] for [facility] pain didn't help Takes nurtec prn continue  Migraine preventative meds tried and failed > 3 months: Botox 5 injections (also tried this in the past), Emgality/Ajovy/Aimovig, Did not tolerate Topiramate IR and ER lost effectiveness, metoprolol, atenolol, depakote, effexor, and multiple other first, second and third line preventatives and acute medications over the many years of migraines, imitrex, maxalt and other triptans, ubrelvy, nurtec, [facility] and other physicians/neurologists. Tried depakote, zonegran.  Meds ordered this encounter  Medications   Atogepant (QULIPTA) 60 MG TABS    Sig: Take 1 tablet (60 mg total) by mouth daily.    Dispense:  30 tablet    Refill:  0   DISCONTD: Atogepant (QULIPTA) 60 MG TABS    Sig: Take 1 tablet (60 mg total) by mouth daily.    Dispense:  30 tablet    Refill:  11   sodium chloride 0.9 % SOLN 100 mL with Eptinezumab-jjmr 100 MG/ML SOLN 100 mg    Sig: Inject 100 mg into the vein every 3 (three) months.    Dispense:  100 mg    Refill:  5    Cc: Copland, Jessica C, MD,    A total of 25 minutes was spent face-to-face with  this patient. Over half this time was spent on counseling patient on the  1. Chronic migraine without aura, with intractable migraine, so stated, with status migrainosus     diagnosis and different diagnostic and therapeutic options, counseling and coordination of care, risks ans benefits of management, compliance, or risk factor reduction and education.    Naomie Dean, MD  [facility] [address] [place], [place] [address]  Phone [phone] Fax [phone]

## 2023-09-09 NOTE — Patient Instructions (Addendum)
Start Vyepti very 3 months start 100mg  and then invcrease to 300mg  In the meantime will give qulipta samples and then stop with the start of vyepti  Stay on topiramate when feelin gbetter we will slowly titrate off Continue nurtec as need  Eptinezumab Injection What is this medication? EPTINEZUMAB (EP ti NEZ ue mab) prevents migraines. It works by blocking a substance in the body that causes migraines. It is a monoclonal antibody. This medicine may be used for other purposes; ask your health care provider or pharmacist if you have questions. COMMON BRAND NAME(S): Vyepti What should I tell my care team before I take this medication? They need to know if you have any of these conditions: An unusual or allergic reaction to eptinezumab, other medications, foods, dyes, or preservatives Pregnant or trying to get pregnant Breast-feeding How should I use this medication? This medication is injected into a vein. It is given by your care team in a hospital or clinic setting. Talk to your care team about the use of this medication in children. Special care may be needed. Overdosage: If you think you have taken too much of this medicine contact a poison control center or emergency room at once. NOTE: This medicine is only for you. Do not share this medicine with others. What if I miss a dose? Keep appointments for follow-up doses. It is important not to miss your dose. Call your care team if you are unable to keep an appointment. What may interact with this medication? Interactions are not expected. This list may not describe all possible interactions. Give your health care provider a list of all the medicines, herbs, non-prescription drugs, or dietary supplements you use. Also tell them if you smoke, drink alcohol, or use illegal drugs. Some items may interact with your medicine. What should I watch for while using this medication? Your condition will be monitored carefully while you are receiving  this medication. What side effects may I notice from receiving this medication? Side effects that you should report to your care team as soon as possible: Allergic reactions or angioedema--skin rash, itching or hives, swelling of the face, eyes, lips, tongue, arms, or legs, trouble swallowing or breathing Side effects that usually do not require medical attention (report to your care team if they continue or are bothersome): Runny or stuffy nose This list may not describe all possible side effects. Call your doctor for medical advice about side effects. You may report side effects to FDA at 1-800-FDA-1088. Where should I keep my medication? This medication is given in a hospital or clinic. It will not be stored at home. NOTE: This sheet is a summary. It may not cover all possible information. If you have questions about this medicine, talk to your doctor, pharmacist, or health care provider.  Atogepant Tablets What is this medication? ATOGEPANT (a TOE je pant) prevents migraines. It works by blocking a substance in the body that causes migraines. This medicine may be used for other purposes; ask your health care provider or pharmacist if you have questions. COMMON BRAND NAME(S): QULIPTA What should I tell my care team before I take this medication? They need to know if you have any of these conditions: Kidney disease Liver disease An unusual or allergic reaction to atogepant, other medications, foods, dyes, or preservatives Pregnant or trying to get pregnant Breast-feeding How should I use this medication? Take this medication by mouth with water. Take it as directed on the prescription label at the same time every  day. You can take it with or without food. If it upsets your stomach, take it with food. Keep taking it unless your care team tells you to stop. Talk to your care team about the use of this medication in children. Special care may be needed. Overdosage: If you think you have  taken too much of this medicine contact a poison control center or emergency room at once. NOTE: This medicine is only for you. Do not share this medicine with others. What if I miss a dose? If you miss a dose, take it as soon as you can. If it is almost time for your next dose, take only that dose. Do not take double or extra doses. What may interact with this medication? Carbamazepine Certain medications for fungal infections, such as itraconazole, ketoconazole Clarithromycin Cyclosporine Efavirenz Etravirine Phenytoin Rifampin St. John's wort This list may not describe all possible interactions. Give your health care provider a list of all the medicines, herbs, non-prescription drugs, or dietary supplements you use. Also tell them if you smoke, drink alcohol, or use illegal drugs. Some items may interact with your medicine. What should I watch for while using this medication? Visit your care team for regular checks on your progress. Tell your care team if your symptoms do not start to get better or if they get worse. What side effects may I notice from receiving this medication? Side effects that you should report to your care team as soon as possible: Allergic reactions--skin rash, itching, hives, swelling of the face, lips, tongue, or throat Side effects that usually do not require medical attention (report to your care team if they continue or are bothersome): Constipation Fatigue Loss of appetite with weight loss Nausea This list may not describe all possible side effects. Call your doctor for medical advice about side effects. You may report side effects to FDA at 1-800-FDA-1088. Where should I keep my medication? Keep out of the reach of children and pets. Store at room temperature between 20 and 25 degrees C (68 and 77 degrees F). Get rid of any unused medication after the expiration date. To get rid of medications that are no longer needed or have expired: Take the medication  to a medication take-back program. Check with your pharmacy or law enforcement to find a location. If you cannot return the medication, check the label or package insert to see if the medication should be thrown out in the garbage or flushed down the toilet. If you are not sure, ask your care team. If it is safe to put it in the trash, take the medication out of the container. Mix the medication with cat litter, dirt, coffee grounds, or other unwanted substance. Seal the mixture in a bag or container. Put it in the trash. NOTE: This sheet is a summary. It may not cover all possible information. If you have questions about this medicine, talk to your doctor, pharmacist, or health care provider.  2024 Elsevier/Gold Standard (2021-09-02 00:00:00)  2024 Elsevier/Gold Standard (2021-09-09 00:00:00)

## 2023-09-09 NOTE — Telephone Encounter (Signed)
Vyepti order signed and given to [facility].

## 2023-09-09 NOTE — Patient Instructions (Addendum)
If your brother has MEDULLARY thyroid cancer we need to have you stop Ozempic  We can get you on some metformin once you are off the topiramate- a low dose may be helpful   Hi Liza- I heard back from Dr Talmage Nap.  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  Want me to go ahead and call one in for you? The exact med may depend on what your insurance prefers, but will try to go with one of the weekly (instead of daily) types   Notes from Dr Lucia Gaskins   Start Vyepti very 3 months start 100mg  and then invcrease to 300mg  In the meantime will give qulipta samples and then stop with the start of vyepti  Stay on topiramate when feelin gbetter we will slowly titrate off Continue nurtec as need

## 2023-09-10 ENCOUNTER — Encounter: Payer: Self-pay | Admitting: Family Medicine

## 2023-09-10 LAB — HEMOGLOBIN A1C: Hgb A1c MFr Bld: 7.2 % — ABNORMAL HIGH (ref 4.6–6.5)

## 2023-09-10 LAB — BASIC METABOLIC PANEL
BUN: 15 mg/dL (ref 6–23)
CO2: 25 meq/L (ref 19–32)
Calcium: 8.5 mg/dL (ref 8.4–10.5)
Chloride: 103 meq/L (ref 96–112)
Creatinine, Ser: 0.71 mg/dL (ref 0.40–1.20)
GFR: 83.91 mL/min (ref >60.00)
Glucose, Bld: 148 mg/dL — ABNORMAL HIGH (ref 70–99)
Potassium: 3.7 meq/L (ref 3.5–5.1)
Sodium: 139 meq/L (ref 135–145)

## 2023-09-10 LAB — LIPID PANEL
Cholesterol: 163 mg/dL (ref 0–200)
HDL: 62.9 mg/dL (ref >39.00)
LDL Cholesterol: 61 mg/dL (ref 0–99)
NonHDL: 99.61
Total CHOL/HDL Ratio: 3
Triglycerides: 194 mg/dL — ABNORMAL HIGH (ref 0.0–149.0)
VLDL: 38.8 mg/dL (ref 0.0–40.0)

## 2023-09-10 LAB — CBC
HCT: 45.7 % (ref 36.0–46.0)
Hemoglobin: 15.1 g/dL — ABNORMAL HIGH (ref 12.0–15.0)
MCHC: 33 g/dL (ref 30.0–36.0)
MCV: 88.3 fL (ref 78.0–100.0)
Platelets: 207 10*3/uL (ref 150.0–400.0)
RBC: 5.17 Mil/uL — ABNORMAL HIGH (ref 3.87–5.11)
RDW: 14.2 % (ref 11.5–15.5)
WBC: 7.2 10*3/uL (ref 4.0–10.5)

## 2023-09-11 ENCOUNTER — Encounter: Payer: Self-pay | Admitting: Internal Medicine

## 2023-09-12 ENCOUNTER — Other Ambulatory Visit: Payer: Self-pay | Admitting: Family Medicine

## 2023-09-12 DIAGNOSIS — J3089 Other allergic rhinitis: Secondary | ICD-10-CM

## 2023-09-17 MED ORDER — MONTELUKAST SODIUM 10 MG PO TABS: 10.0000 mg | ORAL_TABLET | Freq: Every day | ORAL | 3 refills | Status: AC

## 2023-09-17 NOTE — Addendum Note (Signed)
Addended by: Abbe Amsterdam C on: 09/17/2023 12:17 PM   Modules accepted: Orders

## 2023-09-18 ENCOUNTER — Encounter: Payer: Self-pay | Admitting: Sports Medicine

## 2023-09-21 ENCOUNTER — Ambulatory Visit: Payer: Medicare Other | Admitting: Sports Medicine

## 2023-09-21 NOTE — Telephone Encounter (Signed)
 Looks like she is asking about her daughter Keon Benscoter date of birth 06/07/1980, please contact [facility] to see what is going on with scheduling.

## 2023-09-23 ENCOUNTER — Encounter: Payer: Self-pay | Admitting: Sports Medicine

## 2023-09-23 ENCOUNTER — Encounter: Payer: Self-pay | Admitting: Family Medicine

## 2023-09-23 ENCOUNTER — Ambulatory Visit (INDEPENDENT_AMBULATORY_CARE_PROVIDER_SITE_OTHER): Payer: Medicare Other | Admitting: Sports Medicine

## 2023-09-23 ENCOUNTER — Ambulatory Visit: Payer: Medicare Other

## 2023-09-23 ENCOUNTER — Other Ambulatory Visit (INDEPENDENT_AMBULATORY_CARE_PROVIDER_SITE_OTHER): Payer: Self-pay

## 2023-09-23 DIAGNOSIS — M1712 Unilateral primary osteoarthritis, left knee: Secondary | ICD-10-CM

## 2023-09-23 DIAGNOSIS — M25562 Pain in left knee: Secondary | ICD-10-CM

## 2023-09-23 DIAGNOSIS — R490 Dysphonia: Secondary | ICD-10-CM

## 2023-09-23 DIAGNOSIS — Z96651 Presence of right artificial knee joint: Secondary | ICD-10-CM

## 2023-09-23 DIAGNOSIS — Z09 Encounter for follow-up examination after completed treatment for conditions other than malignant neoplasm: Secondary | ICD-10-CM

## 2023-09-23 MED ORDER — TRIAMCINOLONE ACETONIDE 40 MG/ML IJ SUSP
40.0000 mg | Freq: Once | INTRAMUSCULAR | Status: AC
Start: 2023-09-23 — End: 2023-09-23
  Administered 2023-09-23: 40 mg via INTRAMUSCULAR

## 2023-09-23 NOTE — Telephone Encounter (Signed)
 Attempted to call [facility] imaging call went to voice mail.

## 2023-09-23 NOTE — Progress Notes (Signed)
    Procedures performed today:    Procedure: Real-time Ultrasound Guided injection of the left knee Device: Samsung HS60  Verbal informed consent obtained.  Time-out conducted.  Noted no overlying erythema, induration, or other signs of local infection.  Skin prepped in a sterile fashion.  Local anesthesia: Topical Ethyl chloride.  With sterile technique and under real time ultrasound guidance: Mild effusion noted, 1 cc Kenalog 40, 2 cc lidocaine, 2 cc bupivacaine injected easily Completed without difficulty  Advised to call if fevers/chills, erythema, induration, drainage, or persistent bleeding.  Images permanently stored and available for review in PACS.  Impression: Technically successful ultrasound guided injection.  Independent interpretation of notes and tests performed by another provider:   None.  Brief History, Exam, Impression, and Recommendations:    Primary osteoarthritis of left knee This is a very pleasant 73 year old female with known left knee osteoarthritis, she is status post right knee arthroplasty. She is having increasing pain left knee medial joint line, occasional catching. On exam she has tenderness at the joint line, she has a positive McMurray's sign with pain but no palpable pop. She has been using oral oxycodone, unable to take NSAIDs or Tylenol. Due to failure of conservative treatment and oral medications we will proceed with an injection today. Adding x-rays, home physical therapy, return to see me in 6 weeks, we will get her set up for Visco if not better.    ____________________________________________ Ihor Austin. Benjamin Stain, M.D., ABFM., CAQSM., AME. Primary Care and Sports Medicine [facility]  Adjunct Professor of Family Medicine  [place] [employer]  Restaurant manager, fast food

## 2023-09-23 NOTE — Addendum Note (Signed)
 Addended by: Samule Dry on: 09/23/2023 12:05 PM   Modules accepted: Orders

## 2023-09-23 NOTE — Assessment & Plan Note (Signed)
 This is a very pleasant 73 year old female with known left knee osteoarthritis, she is status post right knee arthroplasty. She is having increasing pain left knee medial joint line, occasional catching. On exam she has tenderness at the joint line, she has a positive McMurray's sign with pain but no palpable pop. She has been using oral oxycodone, unable to take NSAIDs or Tylenol. Due to failure of conservative treatment and oral medications we will proceed with an injection today. Adding x-rays, home physical therapy, return to see me in 6 weeks, we will get her set up for Visco if not better.

## 2023-09-24 ENCOUNTER — Telehealth: Payer: Self-pay | Admitting: Neurology

## 2023-09-24 NOTE — Telephone Encounter (Signed)
 Left message at [facility] for an update.

## 2023-09-24 NOTE — Telephone Encounter (Signed)
 Spoke with whitley at radiology scheduling . She will contact patient to schedule . She states this did not make it into her que for scheduling and would not know it was there if we had not reached out to her . I told her that I would send this message to  Maryann Conners to see if they know what might have happened with this one not showing in her que .

## 2023-09-24 NOTE — Telephone Encounter (Signed)
 Im not sure Cala Bradford, the last referral in my que was for PT.

## 2023-09-24 NOTE — Telephone Encounter (Signed)
 LVM and sent mychart msg informing pt of appointment change due to MD schedule change

## 2023-09-24 NOTE — Telephone Encounter (Signed)
 Called [facility] at [phone]  Left message on "[place]" voice mail requesting a call back to check into this.

## 2023-09-24 NOTE — Telephone Encounter (Signed)
 Okay for referral?

## 2023-09-25 ENCOUNTER — Encounter: Payer: Self-pay | Admitting: Urology

## 2023-09-28 ENCOUNTER — Encounter (INDEPENDENT_AMBULATORY_CARE_PROVIDER_SITE_OTHER): Payer: Self-pay | Admitting: Sports Medicine

## 2023-09-28 ENCOUNTER — Encounter: Payer: Self-pay | Admitting: Neurology

## 2023-09-28 ENCOUNTER — Other Ambulatory Visit: Payer: Self-pay | Admitting: Urology

## 2023-09-28 ENCOUNTER — Telehealth: Payer: Self-pay | Admitting: Urology

## 2023-09-28 DIAGNOSIS — Z96651 Presence of right artificial knee joint: Secondary | ICD-10-CM

## 2023-09-28 DIAGNOSIS — G43711 Chronic migraine without aura, intractable, with status migrainosus: Secondary | ICD-10-CM

## 2023-09-28 MED ORDER — CEFDINIR 300 MG PO CAPS: 300.0000 mg | ORAL_CAPSULE | Freq: Two times a day (BID) | ORAL | 0 refills | Status: AC

## 2023-09-28 NOTE — Telephone Encounter (Signed)
 LVM for pt to call back and sch appt. Also sent mychart message to call office to sch.

## 2023-09-28 NOTE — Telephone Encounter (Signed)
-----   Message from Di Kindle sent at 09/28/2023  8:41 AM EST ----- Please contact patient to schedule a f/u appt for April or May 2025.

## 2023-09-29 NOTE — Telephone Encounter (Signed)

## 2023-09-30 ENCOUNTER — Other Ambulatory Visit: Payer: Self-pay | Admitting: Neurology

## 2023-09-30 ENCOUNTER — Telehealth: Payer: Self-pay | Admitting: Pharmacist

## 2023-09-30 ENCOUNTER — Other Ambulatory Visit (HOSPITAL_COMMUNITY): Payer: Self-pay

## 2023-09-30 ENCOUNTER — Other Ambulatory Visit: Payer: Self-pay | Admitting: Family Medicine

## 2023-09-30 DIAGNOSIS — E782 Mixed hyperlipidemia: Secondary | ICD-10-CM

## 2023-09-30 DIAGNOSIS — G43709 Chronic migraine without aura, not intractable, without status migrainosus: Secondary | ICD-10-CM

## 2023-09-30 MED ORDER — QULIPTA 60 MG PO TABS: 60.0000 mg | ORAL_TABLET | Freq: Every day | ORAL | 11 refills | Status: DC

## 2023-09-30 MED ORDER — TOPIRAMATE ER 50 MG PO CAP24: 50.0000 mg | ORAL_CAPSULE | Freq: Every day | ORAL | 3 refills | Status: DC

## 2023-09-30 NOTE — Telephone Encounter (Signed)
 Pharmacy Patient Advocate Encounter   Received notification from Patient Pharmacy that prior authorization for Qulipta 60MG  tablets is required/requested.   Insurance verification completed.   The patient is insured through [facility] .   Per test claim: PA required; PA submitted to above mentioned insurance via CoverMyMeds Key/confirmation #/EOC [id number] Status is pending

## 2023-09-30 NOTE — Addendum Note (Signed)
 Addended by: Naomie Dean B on: 09/30/2023 08:35 AM   Modules accepted: Orders

## 2023-09-30 NOTE — Telephone Encounter (Signed)
 Pharmacy Patient Advocate Encounter  Received notification from [facility] that Prior Authorization for Qulipta 60mg   has been APPROVED from 09/30/2023 to 09/29/2024   PA #/Case ID/Reference #: [id number]

## 2023-10-05 ENCOUNTER — Other Ambulatory Visit (INDEPENDENT_AMBULATORY_CARE_PROVIDER_SITE_OTHER): Payer: Self-pay

## 2023-10-05 ENCOUNTER — Ambulatory Visit (INDEPENDENT_AMBULATORY_CARE_PROVIDER_SITE_OTHER): Admitting: Sports Medicine

## 2023-10-05 DIAGNOSIS — M65311 Trigger thumb, right thumb: Secondary | ICD-10-CM | POA: Diagnosis not present

## 2023-10-05 DIAGNOSIS — Z96651 Presence of right artificial knee joint: Secondary | ICD-10-CM | POA: Diagnosis not present

## 2023-10-05 MED ORDER — QULIPTA 60 MG PO TABS: 60.0000 mg | ORAL_TABLET | Freq: Every day | ORAL | 3 refills | Status: AC

## 2023-10-05 NOTE — Assessment & Plan Note (Signed)
 Warden Fillers also is status post arthroplasty right knee approximately 15 years ago, she had a fall in November of last year with increasing lateral joint line pain, she did have some tenderness on the lateral femoral component of the prosthesis, reproduction of pain with varus stressing. We added a hinged knee brace last month, home PT, x-rays were normal. She was doing better but did have some increased pain so we did order a three-phase bone scan to evaluate for aseptic loosening. This pain has improved considerably so she is going to cancel the bone scan. If she continues to have pain for the next couple of months she is a candidate for geniculate artery embolization.

## 2023-10-05 NOTE — Telephone Encounter (Signed)
 Qulipta 90 day supply sent to [facility]. I also notified Holly w/ [facility] to put any Vyepti authorizations/scheduling on hold for the foreseeable future.

## 2023-10-05 NOTE — Progress Notes (Signed)
    Procedures performed today:    Procedure: Real-time Ultrasound Guided injection of the right flexor pollicis longus tendon sheath Device: Samsung HS60  Verbal informed consent obtained.  Time-out conducted.  Noted no overlying erythema, induration, or other signs of local infection.  Skin prepped in a sterile fashion.  Local anesthesia: Topical Ethyl chloride.  With sterile technique and under real time ultrasound guidance: Noted normal appearing FPL sheath, I injected 1/2 cc lidocaine, 1 cc kenalog 40.   Completed without difficulty  Advised to call if fevers/chills, erythema, induration, drainage, or persistent bleeding.  Images permanently stored and available for review in PACS.  Impression: Technically successful ultrasound guided injection.  Independent interpretation of notes and tests performed by another provider:   None.  Brief History, Exam, Impression, and Recommendations:    Trigger thumb, right thumb This is a very pleasant 73 year old female, she has a history of FPL tendinitis with triggering, last injected in June 2024. She is also status post thumb suspension arthroplasty. 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 The FPL sheath was injected today with ultrasound guidance, return to see me as needed.  History of arthroplasty of right knee Debbie Hayes also is status post arthroplasty right knee approximately 15 years ago, she had a fall in November of last year with increasing lateral joint line pain, she did have some tenderness on the lateral femoral component of the prosthesis, reproduction of pain with varus  stressing. We added a hinged knee brace last month, home PT, x-rays were normal. She was doing better but did have some increased pain so we did order a three-phase bone scan to evaluate for aseptic loosening. This pain has improved considerably so she is going to cancel the bone scan. If she continues to have pain for the next couple of months she is a candidate for geniculate artery embolization.    ____________________________________________ Ihor Austin. Benjamin Stain, M.D., ABFM., CAQSM., AME. Primary Care and Sports Medicine [facility]  Adjunct Professor of Family Medicine  [place] [employer]  Restaurant manager, fast food

## 2023-10-05 NOTE — Assessment & Plan Note (Signed)
 This is a very pleasant 73 year old female, she has a history of FPL tendinitis with triggering, last injected in June 2024. She is also status post thumb suspension arthroplasty. 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 The FPL sheath was injected today with ultrasound guidance, return to see me as needed.

## 2023-10-09 ENCOUNTER — Ambulatory Visit (AMBULATORY_SURGERY_CENTER): Payer: Medicare Other | Admitting: *Deleted

## 2023-10-09 VITALS — Ht 68.0 in | Wt 158.0 lb

## 2023-10-09 DIAGNOSIS — Z860100 Personal history of colon polyps, unspecified: Secondary | ICD-10-CM

## 2023-10-09 MED ORDER — SUFLAVE 178.7 G PO SOLR: 1.0000 | Freq: Once | ORAL | 0 refills | Status: AC

## 2023-10-09 NOTE — Progress Notes (Signed)
 Pt's name and DOB verified at the beginning of the pre-visit wit 2 identifiers  Pt denies any difficulty with ambulating,sitting, laying down or rolling side to side  Pt has no issues with ambulation   Pt has no issues moving head neck or swallowing  No egg or soy allergy known to patient   Hx PONV  Pt denies having issues being intubated  No FH of Malignant Hyperthermia  Pt is not on diet pills or shots  Pt is not on home 02   Pt is not on blood thinners   Pt has frequent issues with constipation RN instructed pt to use Miralax per bottles instructions a week before prep days. Pt states they will  Pt is not on dialysis  Pt denise any abnormal heart rhythms   Pt denies any upcoming cardiac testing  Patient's chart reviewed by Cathlyn Parsons CNRA prior to pre-visit and patient appropriate for the [facility].  Pre-visit completed and red dot placed by patient's name on their procedure day (on provider's schedule).    Visit by phone  Pt states weight is 158 lb  IInstructions reviewed. Pt given [facility], [facility] main # and MD on call # prior to instructions.  Pt states understanding of instructions. Instructed to review again prior to procedure. Pt states they will.   Informed pt that they will receive a text or  call from [facility] regarding there prep med.

## 2023-10-14 ENCOUNTER — Other Ambulatory Visit: Payer: Self-pay | Admitting: Family Medicine

## 2023-10-14 DIAGNOSIS — G4700 Insomnia, unspecified: Secondary | ICD-10-CM

## 2023-10-16 ENCOUNTER — Encounter (HOSPITAL_COMMUNITY)

## 2023-10-20 ENCOUNTER — Telehealth: Payer: Self-pay | Admitting: *Deleted

## 2023-10-20 MED ORDER — SODIUM CHLORIDE 0.9 % IV SOLN: 100.0000 mg | INTRAVENOUS | 5 refills | Status: DC

## 2023-10-20 NOTE — Telephone Encounter (Signed)
 Vyepti order signed and given to [facility].

## 2023-10-20 NOTE — Addendum Note (Signed)
 Addended by: Naomie Dean B on: 10/20/2023 09:46 AM   Modules accepted: Orders

## 2023-10-20 NOTE — Telephone Encounter (Signed)
 Vyepti 100 mg IV every 3 month order form completed and pending Dr Trevor Mace signature for [facility].

## 2023-10-20 NOTE — Telephone Encounter (Signed)
-----   Message from Anson Fret sent at 10/20/2023  9:44 AM EDT ----- Regarding: Please start vyepti protocol Please start vyepti protocol

## 2023-10-27 ENCOUNTER — Encounter: Payer: Self-pay | Admitting: Internal Medicine

## 2023-10-27 ENCOUNTER — Other Ambulatory Visit (HOSPITAL_COMMUNITY): Payer: Self-pay

## 2023-10-28 ENCOUNTER — Telehealth: Payer: Self-pay | Admitting: Internal Medicine

## 2023-10-28 ENCOUNTER — Encounter: Payer: Self-pay | Admitting: Family Medicine

## 2023-10-28 ENCOUNTER — Encounter: Payer: Self-pay | Admitting: Cardiology

## 2023-10-28 NOTE — Telephone Encounter (Signed)
 Patient called and stated that she has colonoscopy procedure scheduled for April the 4 th. Patient stated that was looking at her prep instruction and it stated that she was not able to take her Ozempic after the 2 th and she stated that she took it on the 29 th. Patient is requesting a call back. Please advise.

## 2023-10-29 NOTE — Telephone Encounter (Signed)
 Stomach intolerance can happen with aspirin, and can be reduced by taking aspirin with food or taking acid reflux medication like pantoprazole along with it.  I do think patient would benefit from being on 1 antiplatelet medication.  If patient is not keen on trying aspirin again with the above conditions, I would recommend Plavix 75 mg daily.  If she chooses to go with Plavix, we can just start 75 mg daily without loading.  Thanks MJP

## 2023-10-29 NOTE — Telephone Encounter (Signed)
 Spoke with pt. Colon r/s for 6/13 @ 10:30 AM. New prep instructions sent via mychart. Hard copy will be mailed to address on file.

## 2023-10-30 ENCOUNTER — Encounter: Payer: Medicare Other | Admitting: Internal Medicine

## 2023-10-31 ENCOUNTER — Other Ambulatory Visit: Payer: Self-pay | Admitting: Family Medicine

## 2023-10-31 DIAGNOSIS — E118 Type 2 diabetes mellitus with unspecified complications: Secondary | ICD-10-CM

## 2023-11-02 MED ORDER — SEMAGLUTIDE (1 MG/DOSE) 4 MG/3ML ~~LOC~~ SOPN
1.0000 mg | PEN_INJECTOR | SUBCUTANEOUS | 3 refills | Status: AC
Start: 2023-11-02 — End: ?

## 2023-11-02 NOTE — Addendum Note (Signed)
 Addended by: Abbe Amsterdam C on: 11/02/2023 04:57 PM   Modules accepted: Orders

## 2023-11-04 ENCOUNTER — Ambulatory Visit: Payer: Medicare Other | Admitting: Sports Medicine

## 2023-11-04 ENCOUNTER — Other Ambulatory Visit: Payer: Self-pay

## 2023-11-04 ENCOUNTER — Telehealth: Payer: Self-pay | Admitting: Urology

## 2023-11-04 ENCOUNTER — Other Ambulatory Visit

## 2023-11-04 DIAGNOSIS — N390 Urinary tract infection, site not specified: Secondary | ICD-10-CM

## 2023-11-04 LAB — MICROSCOPIC EXAMINATION

## 2023-11-04 LAB — URINALYSIS, ROUTINE W REFLEX MICROSCOPIC
Bilirubin, UA: NEGATIVE
Ketones, UA: NEGATIVE
Leukocytes,UA: NEGATIVE
Nitrite, UA: POSITIVE — AB
Protein,UA: NEGATIVE
RBC, UA: NEGATIVE
Specific Gravity, UA: 1.015 (ref 1.005–1.030)
Urobilinogen, Ur: 0.2 mg/dL (ref 0.2–1.0)
pH, UA: 5.5 (ref 5.0–7.5)

## 2023-11-04 MED ORDER — METHOCARBAMOL 500 MG PO TABS: 500.0000 mg | ORAL_TABLET | Freq: Three times a day (TID) | ORAL | 0 refills | Status: DC | PRN

## 2023-11-04 NOTE — Addendum Note (Signed)
 Addended by: Abbe Amsterdam C on: 11/04/2023 08:24 PM   Modules accepted: Orders

## 2023-11-04 NOTE — Telephone Encounter (Signed)
 Patient walked into the clinic requesting a UA to check for possible UTI. Please advise.

## 2023-11-05 MED ORDER — CLOPIDOGREL BISULFATE 75 MG PO TABS: 75.0000 mg | ORAL_TABLET | Freq: Every day | ORAL | 3 refills | Status: DC

## 2023-11-05 NOTE — Telephone Encounter (Signed)
 Contacted patient via phone and discussed Dr. Damian Leavell recommendations. Plavix 75 mg was sent in today. Pt admits that she stopped aspirin 81 mg months ago.

## 2023-11-07 LAB — URINE CULTURE

## 2023-11-09 ENCOUNTER — Other Ambulatory Visit: Payer: Self-pay | Admitting: Family Medicine

## 2023-11-09 ENCOUNTER — Other Ambulatory Visit: Payer: Self-pay | Admitting: Urology

## 2023-11-09 DIAGNOSIS — N390 Urinary tract infection, site not specified: Secondary | ICD-10-CM

## 2023-11-09 MED ORDER — CIPROFLOXACIN HCL 500 MG PO TABS: 500.0000 mg | ORAL_TABLET | Freq: Two times a day (BID) | ORAL | 0 refills | Status: AC

## 2023-11-12 ENCOUNTER — Encounter: Payer: Self-pay | Admitting: Family Medicine

## 2023-11-12 DIAGNOSIS — E118 Type 2 diabetes mellitus with unspecified complications: Secondary | ICD-10-CM

## 2023-11-13 MED ORDER — METFORMIN HCL 500 MG PO TABS: 500.0000 mg | ORAL_TABLET | Freq: Every day | ORAL | 3 refills | Status: DC

## 2023-11-19 ENCOUNTER — Telehealth (HOSPITAL_COMMUNITY): Payer: Self-pay | Admitting: Sports Medicine

## 2023-11-19 NOTE — Telephone Encounter (Signed)
 I called patient back to get her Bone Scan rescheduled and she has refused to schedule.  Provider was notified and was ok with cancelling the order

## 2023-11-23 LAB — HM MAMMOGRAPHY

## 2023-11-25 ENCOUNTER — Ambulatory Visit: Admitting: Urology

## 2023-11-25 ENCOUNTER — Encounter: Payer: Self-pay | Admitting: Urology

## 2023-11-25 VITALS — BP 120/73 | HR 76 | Ht 68.0 in | Wt 160.0 lb

## 2023-11-25 DIAGNOSIS — N390 Urinary tract infection, site not specified: Secondary | ICD-10-CM

## 2023-11-25 DIAGNOSIS — Z09 Encounter for follow-up examination after completed treatment for conditions other than malignant neoplasm: Secondary | ICD-10-CM

## 2023-11-25 DIAGNOSIS — Z87440 Personal history of urinary (tract) infections: Secondary | ICD-10-CM | POA: Diagnosis not present

## 2023-11-25 LAB — URINALYSIS, ROUTINE W REFLEX MICROSCOPIC
Bilirubin, UA: NEGATIVE
Glucose, UA: NEGATIVE
Ketones, UA: NEGATIVE
Leukocytes,UA: NEGATIVE
Nitrite, UA: NEGATIVE
Protein,UA: NEGATIVE
Specific Gravity, UA: <1.005 — ABNORMAL LOW (ref 1.005–1.030)
Urobilinogen, Ur: 0.2 mg/dL (ref 0.2–1.0)
pH, UA: 5.5 (ref 5.0–7.5)

## 2023-11-25 LAB — MICROSCOPIC EXAMINATION

## 2023-11-25 MED ORDER — CIPROFLOXACIN HCL 500 MG PO TABS: 500.0000 mg | ORAL_TABLET | Freq: Two times a day (BID) | ORAL | 1 refills | Status: DC

## 2023-11-25 MED ORDER — ESTRADIOL 0.1 MG/GM VA CREA: 1.0000 | TOPICAL_CREAM | VAGINAL | 12 refills | Status: AC

## 2023-11-25 NOTE — Progress Notes (Signed)
 Assessment: 1. Frequent UTI    Plan: Continue methods to reduce the risk of UTI's including increased fluid intake, timed and double voiding, daily cranberry supplement, daily probiotics, and vaginal hormone replacement. Continue vaginal hormone cream 2-3 times per week Continue daily cranberry supplement and daily probiotic. Prescription for Cipro  500 mg BID x 5 days provided. Recommend obtaining a urine culture prior to treatment with antibiotics. Return to office in 6 months  Chief Complaint:  Chief Complaint  Patient presents with   Frequent UTI    History of Present Illness:  Debbie Hayes is a 72 y.o. female who is seen for further evaluation of frequent UTI's. She has a long history of frequent UTIs and has previously been evaluated by Dr. Ottelin at [facility].  She was last seen there in 11/19.  She reports frequent UTIs for approximately 25 years.  Typical symptoms include urgency, bladder pain, and slight dysuria.  She does not have fevers, chills, flank pain, or gross hematuria. She was being managed with Macrodantin  50 mg nightly.  She was previously evaluated in [place]  with ultrasound, CT, and cystoscopy which were unremarkable.  Urine culture results: 6/22 >100K Klebsiella 7/22 >100K Enterococcus 10/22 >100K E. Coli 7/23 >100K Klebsiella 9/23 >100K Klebsiella 9/23 No growth 10/23 >100K Klebsiella 2/24 50-100K Enterococcus 2/24 No growth 3/24 Mixed flora  No history of breast or GYN malignancy.  At her visit in May 2024, she had not had any recent UTI symptoms.  She was on daily cranberry supplement and a daily probiotic.  She was using vaginal hormone cream 2 times per week.  No dysuria or gross hematuria.    She has done fairly well since her last visit in May 2024.  She had UTI symptoms in June 2024 and took cefdinir  with resolution.  She again developed UTI symptoms in February 2025.  She took cefdinir  but her symptoms did not completely  resolve.  No urine sample was obtained at that time.  She had a urinalysis done in early April 2025 which was suspicious for UTI.  Urine culture grew >100 K Enterococcus.  She was treated with Cipro . No no current UTI symptoms.  She continues on vaginal estrogen cream, daily cranberry and daily probiotic.  Portions of the above documentation were copied from a prior visit for review purposes only.  Past Medical History:  Past Medical History:  Diagnosis Date   Allergy    Asthma    Cancer (HCC)    thyroid  cancer   Cataract    Chronic migraine BOTOX INJECTION EVERY 3 MONTHS   Chronic pain in right shoulder    Constipation    Diabetes mellitus    Disorder of inner ear CAUSES VERTIGO OCCASIONALLY   Fibromyalgia    GERD (gastroesophageal reflux disease)    Hemorrhoid    History of bladder infections    History of thyroid  cancer 2009  S/P TOTAL THYROIDECTOMY AND RADIATION   Hyperlipidemia    Hypertension CARDIOLOGIST- DR Anastasia Balo- WILL REQUEST LATE NOTE   DENIES S & S   Hypothyroidism    Insomnia    Left shoulder pain    Macular degeneration    MRSA infection    nasal treated more than 15 years ago   Normal nuclear stress test 01/25/2009   OA (osteoarthritis) JOINTS   Osteopenia    Osteoporosis    Painful orthopaedic hardware (HCC)    left foot   PONV (postoperative nausea and vomiting) SEVERE   Rotator cuff  disorder LEFT SHOULDER RTC IMPINGMENT   Sleep apnea    mouth guard, no cpap    Sleep apnea in adult 06/26/2017   Spondylolisthesis of lumbar region    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   Wears glasses     Past Surgical History:  Past Surgical History:  Procedure Laterality Date   BACK SURGERY     Dr Larrie Po- spondylosis   BILATERAL CARPAL TUNNEL RELEASE  1994   BILATERAL ELBOW SURG.  1999   BILATERAL SALPINGOOPHECTOMY  1993   POST-OP URETER REPAIR 12 DAYS AFTER    CATARACT EXTRACTION, BILATERAL     COLONOSCOPY     EYE SURGERY     FOOT ARTHRODESIS Left  07/10/2016   Procedure: LEFT 2ND TARSAL METATARSAL ARTHRODESIS  GASTROC RECESSION LEFT LAPIDUS MODIFIED MCBRIDE BUIONECTOMY;  Surgeon: Amada Backer, MD;  Location: [facility];  Service: Orthopedics;  Laterality: Left;   GASTROC RECESSION EXTREMITY Left 07/10/2016   Procedure: GASTROC RECESSION EXTREMITY;  Surgeon: Amada Backer, MD;  Location: [facility];  Service: Orthopedics;  Laterality: Left;   HARDWARE REMOVAL Left 05/20/2018   Procedure: Left foot removal of deep implants;  Surgeon: Amada Backer, MD;  Location: [facility];  Service: Orthopedics;  Laterality: Left;    LEFT THUMB JOINT REPLACEMENT  2005   RIGHT DONE IN 2004   OOPHORECTOMY     POLYPECTOMY     RIGHT KNEE ARTHROSCOPY  X2  BEFORE 2011   RIGHT KNEE ARTHROSCOPY/ PARTIAL LATERAL MENISECTOMY/ TRICOMPARTMENT CHONDROPLASTY/ DECOMPRESSION CYST  10-26-2009   RIGHT KNEE CLOSED MANIPULATION  09-11-2010   RIGHT SHOULDER ARTHROSCOPY  2010  &  2004   TIBIA CYST REMOVED AND ORIF LEG FX  1958   TONSILLECTOMY  1968   TOTAL KNEE ARTHROPLASTY  07-16-2010   RIGHT   TOTAL THYROIDECTOMY  2009   CANCER  (POST-OP BLEED)  AND RADIATION TX   trigger fingers Right 2013   3rd and 4th fingers   UPPER GASTROINTESTINAL ENDOSCOPY     VAGINAL HYSTERECTOMY  1990    Allergies:  Allergies  Allergen Reactions   Sulfa Antibiotics Hives   Statins Other (See Comments)    Pt has tried multiple and cannot tolerate    Family History:  Family History  Problem Relation Age of Onset   Alzheimer's disease Mother    Heart disease Father    Cirrhosis Father    Heart attack Father    Macular degeneration Father    Colon cancer Neg Hx    Esophageal cancer Neg Hx    Rectal cancer Neg Hx    Stomach cancer Neg Hx    Colon polyps Neg Hx     Social History:  Social History   Tobacco Use   Smoking status: Never   Smokeless tobacco: Never  Vaping Use   Vaping status: Never Used  Substance Use Topics    Alcohol use: Yes    Comment: RARE   Drug use: No    ROS: Constitutional:  Negative for fever, chills, weight loss CV: Negative for chest pain, previous MI, hypertension Respiratory:  Negative for shortness of breath, wheezing, sleep apnea, frequent cough GI:  Negative for nausea, vomiting, bloody stool, GERD  Physical exam: BP 120/73   Pulse 76   Ht 5\' 8"  (1.727 m)   Wt 160 lb (72.6 kg)   BMI 24.33 kg/m  GENERAL APPEARANCE:  Well appearing, well developed, well nourished, NAD HEENT:  Atraumatic, normocephalic, oropharynx clear  NECK:  Supple without lymphadenopathy or thyromegaly ABDOMEN:  Soft, non-tender, no masses EXTREMITIES:  Moves all extremities well, without clubbing, cyanosis, or edema NEUROLOGIC:  Alert and oriented x 3, normal gait, CN II-XII grossly intact MENTAL STATUS:  appropriate BACK:  Non-tender to palpation, No CVAT SKIN:  Warm, dry, and intact  Results: U/A: 0-5 WBCs, 0-2 RBCs, few bacteria

## 2023-12-01 NOTE — Progress Notes (Deleted)
 [facility] [address], [address] [place], [place] [address] [phone]  Date:  12/07/2023   Name:  Debbie Hayes   DOB:  [birth year]   MRN:  [id number]  PCP:  Kaylee Partridge, MD    Chief Complaint: No chief complaint on file.   History of Present Illness:  Debbie Hayes is a 73 y.o. very pleasant female patient who presents with the following:  Patient seen today for follow-up of diabetes.  Most recent visit with myself was in February-history of migraine headache, sleep apnea, diabetes, hypothyroidism, hyperlipidemia, chronic joint pain with periodic use of narcotic pain medication  We increased her dose of Ozempic  to 1 mg over the last month I also added a small amount of metformin  Lab Results  Component Value Date   HGBA1C 7.2 (H) 09/09/2023   Shingrix Colon cancer screening She is due for tetanus booster Urine micro can be updated Eye exam  Patient Active Problem List   Diagnosis Date Noted   Primary osteoarthritis of left knee 09/23/2023   History of arthroplasty of right knee 07/03/2023   Primary osteoarthritis of right shoulder 07/03/2023   Coronary artery disease involving native coronary artery of native heart without angina pectoris 05/25/2023   Agatston coronary artery calcium  score greater than 400 11/06/2022   Statin myopathy 11/06/2022   Frequent UTI 10/29/2022   Wart of hand 09/30/2022   Acute cystitis without hematuria 05/14/2021   Medial epicondylitis, right 03/25/2021   Trigger thumb, right thumb 11/19/2020   Right wrist pain 10/23/2020   Osteoporosis 04/05/2020   Trigger finger, left middle finger 07/27/2019   Chronic neck pain 11/10/2018   Orthopedic hardware present 02/17/2018   Controlled diabetes mellitus type 2 with complications (HCC) 12/15/2017   Intractable chronic migraine without aura and with status migrainosus 12/15/2017   Arthralgia 12/15/2017   Joint pain  12/15/2017   Migraine, transformed 12/15/2017   Chronic migraine without aura, with intractable migraine, so stated, with status migrainosus 12/15/2017   Pain of left shoulder joint on movement 10/19/2017   OSA (obstructive sleep apnea) 06/26/2017   Snoring 03/25/2016   Palpitations 03/25/2016   Headache 03/25/2016   Sensory disorder of trigeminal nerve 11/26/2015   Spondylolisthesis of lumbar region 08/09/2014   Lumbar adjacent segment disease with spondylolisthesis 08/09/2014   Back pain 02/17/2014   Macular degeneration 02/17/2014   Backache 02/17/2014   Other and unspecified hyperlipidemia 10/14/2013   Hyperlipidemia 10/14/2013   Onychomycosis 12/01/2012   Arthritis 04/12/2012   Hypothyroidism 04/12/2012    Past Medical History:  Diagnosis Date   Allergy    Asthma    Cancer (HCC)    thyroid  cancer   Cataract    Chronic migraine BOTOX INJECTION EVERY 3 MONTHS   Chronic pain in right shoulder    Constipation    Diabetes mellitus    Disorder of inner ear CAUSES VERTIGO OCCASIONALLY   Fibromyalgia    GERD (gastroesophageal reflux disease)    Hemorrhoid    History of bladder infections    History of thyroid  cancer 2009  S/P TOTAL THYROIDECTOMY AND RADIATION   Hyperlipidemia    Hypertension CARDIOLOGIST- DR Anastasia Balo- WILL REQUEST LATE NOTE   DENIES S & S   Hypothyroidism    Insomnia    Left shoulder pain    Macular degeneration    MRSA infection    nasal treated more than 15 years ago   Normal nuclear stress  test 01/25/2009   OA (osteoarthritis) JOINTS   Osteopenia    Osteoporosis    Painful orthopaedic hardware (HCC)    left foot   PONV (postoperative nausea and vomiting) SEVERE   Rotator cuff disorder LEFT SHOULDER RTC IMPINGMENT   Sleep apnea    mouth guard, no cpap    Sleep apnea in adult 06/26/2017   Spondylolisthesis of lumbar region    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   Wears glasses     Past Surgical History:  Procedure Laterality Date   BACK  SURGERY     Dr Larrie Po- spondylosis   BILATERAL CARPAL TUNNEL RELEASE  1994   BILATERAL ELBOW SURG.  1999   BILATERAL SALPINGOOPHECTOMY  1993   POST-OP URETER REPAIR 12 DAYS AFTER    CATARACT EXTRACTION, BILATERAL     COLONOSCOPY     EYE SURGERY     FOOT ARTHRODESIS Left 07/10/2016   Procedure: LEFT 2ND TARSAL METATARSAL ARTHRODESIS  GASTROC RECESSION LEFT LAPIDUS MODIFIED MCBRIDE BUIONECTOMY;  Surgeon: Amada Backer, MD;  Location: [facility];  Service: Orthopedics;  Laterality: Left;   GASTROC RECESSION EXTREMITY Left 07/10/2016   Procedure: GASTROC RECESSION EXTREMITY;  Surgeon: Amada Backer, MD;  Location: [facility];  Service: Orthopedics;  Laterality: Left;   HARDWARE REMOVAL Left 05/20/2018   Procedure: Left foot removal of deep implants;  Surgeon: Amada Backer, MD;  Location: [facility];  Service: Orthopedics;  Laterality: Left;    LEFT THUMB JOINT REPLACEMENT  2005   RIGHT DONE IN 2004   OOPHORECTOMY     POLYPECTOMY     RIGHT KNEE ARTHROSCOPY  X2  BEFORE 2011   RIGHT KNEE ARTHROSCOPY/ PARTIAL LATERAL MENISECTOMY/ TRICOMPARTMENT CHONDROPLASTY/ DECOMPRESSION CYST  10-26-2009   RIGHT KNEE CLOSED MANIPULATION  09-11-2010   RIGHT SHOULDER ARTHROSCOPY  2010  &  2004   TIBIA CYST REMOVED AND ORIF LEG FX  1958   TONSILLECTOMY  1968   TOTAL KNEE ARTHROPLASTY  07-16-2010   RIGHT   TOTAL THYROIDECTOMY  2009   CANCER  (POST-OP BLEED)  AND RADIATION [place]   trigger fingers Right 2013   3rd and 4th fingers   UPPER GASTROINTESTINAL ENDOSCOPY     VAGINAL HYSTERECTOMY  1990    Social History   Tobacco Use   Smoking status: Never   Smokeless tobacco: Never  Vaping Use   Vaping status: Never Used  Substance Use Topics   Alcohol use: Yes    Comment: RARE   Drug use: No    Family History  Problem Relation Age of Onset   Alzheimer's disease Mother    Heart disease Father    Cirrhosis Father    Heart attack Father    Macular  degeneration Father    Colon cancer Neg Hx    Esophageal cancer Neg Hx    Rectal cancer Neg Hx    Stomach cancer Neg Hx    Colon polyps Neg Hx     Allergies  Allergen Reactions   Sulfa Antibiotics Hives   Statins Other (See Comments)    Pt has tried multiple and cannot tolerate    Medication list has been reviewed and updated.  Current Outpatient Medications on File Prior to Visit  Medication Sig Dispense Refill   acetaminophen  (TYLENOL ) 500 MG tablet Take 500 mg by mouth every 6 (six) hours as needed.     Albuterol  Sulfate (PROAIR  RESPICLICK) 108 (90 Base) MCG/ACT AEPB Inhale 2 puffs every 4-6  hours as needed for wheezing or shortness of breath 1 each 12   alendronate  (FOSAMAX ) 70 MG tablet Take 1 tablet (70 mg total) by mouth every 7 (seven) days. Take with a full glass of water on an empty stomach. 4 tablet 11   Atogepant  (QULIPTA ) 60 MG TABS Take 1 tablet (60 mg total) by mouth daily. 30 tablet 0   Atogepant  (QULIPTA ) 60 MG TABS Take 1 tablet (60 mg total) by mouth daily. 90 tablet 3   blood glucose meter kit and supplies KIT Test blood sugar once daily. Dx code: E11.9 1 each 0   Blood Glucose Monitoring Suppl (FREESTYLE LITE) DEVI Check blood sugar no more than twice daily 1 each 0   Blood Glucose Monitoring Suppl (ONE TOUCH ULTRA 2) w/Device KIT Dispense one meter and test strips, lancets- 100 of each with PRN RF 1 kit 0   ciprofloxacin  (CIPRO ) 500 MG tablet Take 1 tablet (500 mg total) by mouth every 12 (twelve) hours. 10 tablet 1   clopidogrel  (PLAVIX ) 75 MG tablet Take 1 tablet (75 mg total) by mouth daily. 90 tablet 3   Cranberry 125 MG TABS Take by mouth.     dicyclomine  (BENTYL ) 20 MG tablet Take 1 tablet (20 mg total) by mouth 4 (four) times daily as needed for spasms. 60 tablet 1   estradiol  (ESTRACE ) 0.1 MG/GM vaginal cream Place 1 Applicatorful vaginally 3 (three) times a week. 42.5 g 12   Evolocumab  (REPATHA  SURECLICK) 140 MG/ML SOAJ Inject 140 mg into the skin every  14 (fourteen) days. 6 mL 0   Fexofenadine HCl (ALLEGRA PO) Take by mouth.     fluticasone  (FLOVENT  HFA) 110 MCG/ACT inhaler Inhale 1-2 puffs into the lungs 2 (two) times daily. 1 each 12   glucose blood (FREESTYLE LITE) test strip CHECK BLOOD SUGARS NO MORE THAN TWICE DAILY 100 each 12   lactobacillus acidophilus (BACID) TABS tablet Take 2 tablets by mouth 3 (three) times daily.     Lancets (FREESTYLE) lancets Check blood sugars no more than twice daily 100 each 12   levothyroxine  (SYNTHROID ) 125 MCG tablet TAKE 1 TABLET BY MOUTH BEFORE BREAKFAST 90 tablet 3   losartan  (COZAAR ) 25 MG tablet Take 1 tablet (25 mg total) by mouth daily. 90 tablet 3   Magnesium 400 MG CAPS Take by mouth.     metFORMIN  (GLUCOPHAGE ) 500 MG tablet Take 1 tablet (500 mg total) by mouth daily. 90 tablet 3   methocarbamol  (ROBAXIN ) 500 MG tablet Take 1 tablet (500 mg total) by mouth every 8 (eight) hours as needed for muscle spasms. 30 tablet 0   metoprolol  succinate (TOPROL -XL) 50 MG 24 hr tablet Take 1 tablet (50 mg total) by mouth daily. Take with or immediately following a meal (Patient taking differently: Take 25 mg by mouth daily. Take with or immediately following a meal) 90 tablet 1   montelukast  (SINGULAIR ) 10 MG tablet Take 1 tablet (10 mg total) by mouth at bedtime. 90 tablet 3   ondansetron  (ZOFRAN -ODT) 8 MG disintegrating tablet Take 8 mg by mouth every 8 (eight) hours as needed for nausea or vomiting.     Oxycodone  HCl 10 MG TABS Take 1-1.5 tablets (10-15 mg total) by mouth every 8 (eight) hours as needed. 60 tablet 0   Polyethylene Glycol 3350  (MIRALAX PO) Take by mouth.     Rimegepant Sulfate (NURTEC) 75 MG TBDP Take 1 tablet by mouth as needed (pain).     Semaglutide , 1 MG/DOSE, 4 MG/3ML  SOPN Inject 1 mg into the skin once a week. 9 mL 3   sodium chloride  0.9 % SOLN 100 mL with Eptinezumab-jjmr 100 MG/ML SOLN 100 mg Inject 100 mg into the vein every 3 (three) months. 100 mg 5   traZODone  (DESYREL ) 100 MG  tablet TAKE 2 AND 1/2 TABLETS(250 MG) BY MOUTH AT BEDTIME 225 tablet 1   zolpidem  (AMBIEN ) 5 MG tablet TAKE 1 TABLET(5 MG) BY MOUTH AT BEDTIME AS NEEDED FOR SLEEP 90 tablet 1   No current facility-administered medications on file prior to visit.    Review of Systems:  As per HPI- otherwise negative.   Physical Examination: There were no vitals filed for this visit. There were no vitals filed for this visit. There is no height or weight on file to calculate BMI. Ideal Body Weight:    GEN: no acute distress. HEENT: Atraumatic, Normocephalic.  Ears and Nose: No external deformity. CV: RRR, No M/G/R. No JVD. No thrill. No extra heart sounds. PULM: CTA B, no wheezes, crackles, rhonchi. No retractions. No resp. distress. No accessory muscle use. ABD: S, NT, ND, +BS. No rebound. No HSM. EXTR: No c/c/e PSYCH: Normally interactive. Conversant.    Assessment and Plan: ***  Signed Gates Kasal, MD

## 2023-12-06 NOTE — Progress Notes (Deleted)
 [facility] [address], [address] [place], [place] [address] [phone]  Date:  12/09/2023   Name:  Debbie Hayes   DOB:  Feb 27, 1951   MRN:  [id number]  PCP:  Kaylee Partridge, MD    Chief Complaint: No chief complaint on file.   History of Present Illness:  Debbie Hayes is a 73 y.o. very pleasant female patient who presents with the following:  Patient seen today to discuss diabetes.  I saw her most recently in February She was able to stop Topamax  in the last month or so and we started her on metformin  (had avoided previously due to interaction risk)  We also changed her GLP-1 to semaglutide  a few months ago and increased her dose to 1 mg in April -We can recheck A1c today to see progress  Lab Results  Component Value Date   HGBA1C 7.2 (H) 09/09/2023   Colon cancer screening Tetanus appears to be due Urine micro can be updated Eye exam Shingrix? Patient Active Problem List   Diagnosis Date Noted   Primary osteoarthritis of left knee 09/23/2023   History of arthroplasty of right knee 07/03/2023   Primary osteoarthritis of right shoulder 07/03/2023   Coronary artery disease involving native coronary artery of native heart without angina pectoris 05/25/2023   Agatston coronary artery calcium  score greater than 400 11/06/2022   Statin myopathy 11/06/2022   Frequent UTI 10/29/2022   Wart of hand 09/30/2022   Acute cystitis without hematuria 05/14/2021   Medial epicondylitis, right 03/25/2021   Trigger thumb, right thumb 11/19/2020   Right wrist pain 10/23/2020   Osteoporosis 04/05/2020   Trigger finger, left middle finger 07/27/2019   Chronic neck pain 11/10/2018   Orthopedic hardware present 02/17/2018   Controlled diabetes mellitus type 2 with complications (HCC) 12/15/2017   Intractable chronic migraine without aura and with status migrainosus 12/15/2017   Arthralgia 12/15/2017   Joint pain  12/15/2017   Migraine, transformed 12/15/2017   Chronic migraine without aura, with intractable migraine, so stated, with status migrainosus 12/15/2017   Pain of left shoulder joint on movement 10/19/2017   OSA (obstructive sleep apnea) 06/26/2017   Snoring 03/25/2016   Palpitations 03/25/2016   Headache 03/25/2016   Sensory disorder of trigeminal nerve 11/26/2015   Spondylolisthesis of lumbar region 08/09/2014   Lumbar adjacent segment disease with spondylolisthesis 08/09/2014   Back pain 02/17/2014   Macular degeneration 02/17/2014   Backache 02/17/2014   Other and unspecified hyperlipidemia 10/14/2013   Hyperlipidemia 10/14/2013   Onychomycosis 12/01/2012   Arthritis 04/12/2012   Hypothyroidism 04/12/2012    Past Medical History:  Diagnosis Date   Allergy    Asthma    Cancer (HCC)    thyroid  cancer   Cataract    Chronic migraine BOTOX INJECTION EVERY 3 MONTHS   Chronic pain in right shoulder    Constipation    Diabetes mellitus    Disorder of inner ear CAUSES VERTIGO OCCASIONALLY   Fibromyalgia    GERD (gastroesophageal reflux disease)    Hemorrhoid    History of bladder infections    History of thyroid  cancer 2009  S/P TOTAL THYROIDECTOMY AND RADIATION   Hyperlipidemia    Hypertension CARDIOLOGIST- DR Anastasia Balo- WILL REQUEST LATE NOTE   DENIES S & S   Hypothyroidism    Insomnia    Left shoulder pain    Macular degeneration    MRSA infection    nasal  treated more than 15 years ago   Normal nuclear stress test 01/25/2009   OA (osteoarthritis) JOINTS   Osteopenia    Osteoporosis    Painful orthopaedic hardware (HCC)    left foot   PONV (postoperative nausea and vomiting) SEVERE   Rotator cuff disorder LEFT SHOULDER RTC IMPINGMENT   Sleep apnea    mouth guard, no cpap    Sleep apnea in adult 06/26/2017   Spondylolisthesis of lumbar region    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   Wears glasses     Past Surgical History:  Procedure Laterality Date   BACK  SURGERY     Dr Larrie Po- spondylosis   BILATERAL CARPAL TUNNEL RELEASE  1994   BILATERAL ELBOW SURG.  1999   BILATERAL SALPINGOOPHECTOMY  1993   POST-OP URETER REPAIR 12 DAYS AFTER    CATARACT EXTRACTION, BILATERAL     COLONOSCOPY     EYE SURGERY     FOOT ARTHRODESIS Left 07/10/2016   Procedure: LEFT 2ND TARSAL METATARSAL ARTHRODESIS  GASTROC RECESSION LEFT LAPIDUS MODIFIED MCBRIDE BUIONECTOMY;  Surgeon: Amada Backer, MD;  Location: [facility];  Service: Orthopedics;  Laterality: Left;   GASTROC RECESSION EXTREMITY Left 07/10/2016   Procedure: GASTROC RECESSION EXTREMITY;  Surgeon: Amada Backer, MD;  Location: [facility];  Service: Orthopedics;  Laterality: Left;   HARDWARE REMOVAL Left 05/20/2018   Procedure: Left foot removal of deep implants;  Surgeon: Amada Backer, MD;  Location: [facility];  Service: Orthopedics;  Laterality: Left;    LEFT THUMB JOINT REPLACEMENT  2005   RIGHT DONE IN 2004   OOPHORECTOMY     POLYPECTOMY     RIGHT KNEE ARTHROSCOPY  X2  BEFORE 2011   RIGHT KNEE ARTHROSCOPY/ PARTIAL LATERAL MENISECTOMY/ TRICOMPARTMENT CHONDROPLASTY/ DECOMPRESSION CYST  10-26-2009   RIGHT KNEE CLOSED MANIPULATION  09-11-2010   RIGHT SHOULDER ARTHROSCOPY  2010  &  2004   TIBIA CYST REMOVED AND ORIF LEG FX  1958   TONSILLECTOMY  1968   TOTAL KNEE ARTHROPLASTY  07-16-2010   RIGHT   TOTAL THYROIDECTOMY  2009   CANCER  (POST-OP BLEED)  AND RADIATION TX   trigger fingers Right 2013   3rd and 4th fingers   UPPER GASTROINTESTINAL ENDOSCOPY     VAGINAL HYSTERECTOMY  1990    Social History   Tobacco Use   Smoking status: Never   Smokeless tobacco: Never  Vaping Use   Vaping status: Never Used  Substance Use Topics   Alcohol use: Yes    Comment: RARE   Drug use: No    Family History  Problem Relation Age of Onset   Alzheimer's disease Mother    Heart disease Father    Cirrhosis Father    Heart attack Father    Macular  degeneration Father    Colon cancer Neg Hx    Esophageal cancer Neg Hx    Rectal cancer Neg Hx    Stomach cancer Neg Hx    Colon polyps Neg Hx     Allergies  Allergen Reactions   Sulfa Antibiotics Hives   Statins Other (See Comments)    Pt has tried multiple and cannot tolerate    Medication list has been reviewed and updated.  Current Outpatient Medications on File Prior to Visit  Medication Sig Dispense Refill   acetaminophen  (TYLENOL ) 500 MG tablet Take 500 mg by mouth every 6 (six) hours as needed.     Albuterol  Sulfate (PROAIR   RESPICLICK) 108 (90 Base) MCG/ACT AEPB Inhale 2 puffs every 4-6 hours as needed for wheezing or shortness of breath 1 each 12   alendronate  (FOSAMAX ) 70 MG tablet Take 1 tablet (70 mg total) by mouth every 7 (seven) days. Take with a full glass of water on an empty stomach. 4 tablet 11   Atogepant  (QULIPTA ) 60 MG TABS Take 1 tablet (60 mg total) by mouth daily. 30 tablet 0   Atogepant  (QULIPTA ) 60 MG TABS Take 1 tablet (60 mg total) by mouth daily. 90 tablet 3   blood glucose meter kit and supplies KIT Test blood sugar once daily. Dx code: E11.9 1 each 0   Blood Glucose Monitoring Suppl (FREESTYLE LITE) DEVI Check blood sugar no more than twice daily 1 each 0   Blood Glucose Monitoring Suppl (ONE TOUCH ULTRA 2) w/Device KIT Dispense one meter and test strips, lancets- 100 of each with PRN RF 1 kit 0   ciprofloxacin  (CIPRO ) 500 MG tablet Take 1 tablet (500 mg total) by mouth every 12 (twelve) hours. 10 tablet 1   clopidogrel  (PLAVIX ) 75 MG tablet Take 1 tablet (75 mg total) by mouth daily. 90 tablet 3   Cranberry 125 MG TABS Take by mouth.     dicyclomine  (BENTYL ) 20 MG tablet Take 1 tablet (20 mg total) by mouth 4 (four) times daily as needed for spasms. 60 tablet 1   estradiol  (ESTRACE ) 0.1 MG/GM vaginal cream Place 1 Applicatorful vaginally 3 (three) times a week. 42.5 g 12   Evolocumab  (REPATHA  SURECLICK) 140 MG/ML SOAJ Inject 140 mg into the skin every  14 (fourteen) days. 6 mL 0   Fexofenadine HCl (ALLEGRA PO) Take by mouth.     fluticasone  (FLOVENT  HFA) 110 MCG/ACT inhaler Inhale 1-2 puffs into the lungs 2 (two) times daily. 1 each 12   glucose blood (FREESTYLE LITE) test strip CHECK BLOOD SUGARS NO MORE THAN TWICE DAILY 100 each 12   lactobacillus acidophilus (BACID) TABS tablet Take 2 tablets by mouth 3 (three) times daily.     Lancets (FREESTYLE) lancets Check blood sugars no more than twice daily 100 each 12   levothyroxine  (SYNTHROID ) 125 MCG tablet TAKE 1 TABLET BY MOUTH BEFORE BREAKFAST 90 tablet 3   losartan  (COZAAR ) 25 MG tablet Take 1 tablet (25 mg total) by mouth daily. 90 tablet 3   Magnesium 400 MG CAPS Take by mouth.     metFORMIN  (GLUCOPHAGE ) 500 MG tablet Take 1 tablet (500 mg total) by mouth daily. 90 tablet 3   methocarbamol  (ROBAXIN ) 500 MG tablet Take 1 tablet (500 mg total) by mouth every 8 (eight) hours as needed for muscle spasms. 30 tablet 0   metoprolol  succinate (TOPROL -XL) 50 MG 24 hr tablet Take 1 tablet (50 mg total) by mouth daily. Take with or immediately following a meal (Patient taking differently: Take 25 mg by mouth daily. Take with or immediately following a meal) 90 tablet 1   montelukast  (SINGULAIR ) 10 MG tablet Take 1 tablet (10 mg total) by mouth at bedtime. 90 tablet 3   ondansetron  (ZOFRAN -ODT) 8 MG disintegrating tablet Take 8 mg by mouth every 8 (eight) hours as needed for nausea or vomiting.     Oxycodone  HCl 10 MG TABS Take 1-1.5 tablets (10-15 mg total) by mouth every 8 (eight) hours as needed. 60 tablet 0   Polyethylene Glycol 3350  (MIRALAX PO) Take by mouth.     Rimegepant Sulfate (NURTEC) 75 MG TBDP Take 1 tablet by mouth as  needed (pain).     Semaglutide , 1 MG/DOSE, 4 MG/3ML SOPN Inject 1 mg into the skin once a week. 9 mL 3   sodium chloride  0.9 % SOLN 100 mL with Eptinezumab-jjmr 100 MG/ML SOLN 100 mg Inject 100 mg into the vein every 3 (three) months. 100 mg 5   traZODone  (DESYREL ) 100 MG  tablet TAKE 2 AND 1/2 TABLETS(250 MG) BY MOUTH AT BEDTIME 225 tablet 1   zolpidem  (AMBIEN ) 5 MG tablet TAKE 1 TABLET(5 MG) BY MOUTH AT BEDTIME AS NEEDED FOR SLEEP 90 tablet 1   No current facility-administered medications on file prior to visit.    Review of Systems:  As per HPI- otherwise negative.   Physical Examination: There were no vitals filed for this visit. There were no vitals filed for this visit. There is no height or weight on file to calculate BMI. Ideal Body Weight:    GEN: no acute distress. HEENT: Atraumatic, Normocephalic.  Ears and Nose: No external deformity. CV: RRR, No M/G/R. No JVD. No thrill. No extra heart sounds. PULM: CTA B, no wheezes, crackles, rhonchi. No retractions. No resp. distress. No accessory muscle use. ABD: S, NT, ND, +BS. No rebound. No HSM. EXTR: No c/c/e PSYCH: Normally interactive. Conversant.    Assessment and Plan: ***  Signed Gates Kasal, MD

## 2023-12-07 ENCOUNTER — Ambulatory Visit: Admitting: Family Medicine

## 2023-12-07 DIAGNOSIS — E118 Type 2 diabetes mellitus with unspecified complications: Secondary | ICD-10-CM

## 2023-12-07 DIAGNOSIS — E782 Mixed hyperlipidemia: Secondary | ICD-10-CM

## 2023-12-08 NOTE — Progress Notes (Unsigned)
 [facility] [address] [place], [place] [address] [phone]  Date:  12/10/2023   Name:  Debbie Hayes   DOB:  [birth year]   MRN:  [id number]  PCP:  Kaylee Partridge, MD    Chief Complaint: No chief complaint on file.   History of Present Illness:  Debbie Hayes is a 73 y.o. very pleasant female patient who presents with the following:  Patient seen today to discuss diabetes.  I saw her most recently in February She was able to stop Topamax  in the last month or so and we started her on metformin  (had avoided previously due to interaction risk).  She is tolerating metformin  just fine  We also changed her GLP-1 to semaglutide  a few months ago and increased her dose to 1 mg in April She seems to be doing well on this - she had to stop the farxiga  however as it was making her vomit -We can recheck A1c today to see progress  We discussed her sleeping medication.  She is taking 5 mg of Ambien  and 250 mg of trazodone  This is not a new regimen, but as she is getting older she would like to cut down on sedating medications if she can She has tried cutting down on her trazodone  but she does not sleep well when she does this  She is unfortunately not able to cut the Ambien  pills in half as they are so small begin with I encouraged her to try going without her Ambien  some of the time and see how she does.  She agrees to try this  Gaye Kato also brings up some chest pain which she thinks is most likely musculoskeletal.  She typically notes it inferior and lateral to the left breast, sometimes on the right side She may have this pain periodically over the last 3 months or so No sx with walking- she does not walk much due to her back pain but does not notice any pain with exertion The pain may last about an hour, taking Robaxin  typically makes it go away.  This makes her think it is muscular She last noticed it 2 days  ago May occur a few times a week No SOB, she denies feeling chest pressure  Lab Results  Component Value Date   HGBA1C 7.2 (H) 09/09/2023   Colon cancer screening Tetanus appears to be due Urine micro can be updated Eye exam Shingrix?  Wt Readings from Last 3 Encounters:  12/10/23 161 lb 12.8 oz (73.4 kg)  11/25/23 160 lb (72.6 kg)  10/09/23 158 lb (71.7 kg)    Patient Active Problem List   Diagnosis Date Noted   Primary osteoarthritis of left knee 09/23/2023   History of arthroplasty of right knee 07/03/2023   Primary osteoarthritis of right shoulder 07/03/2023   Coronary artery disease involving native coronary artery of native heart without angina pectoris 05/25/2023   Agatston coronary artery calcium  score greater than 400 11/06/2022   Statin myopathy 11/06/2022   Frequent UTI 10/29/2022   Wart of hand 09/30/2022   Acute cystitis without hematuria 05/14/2021   Medial epicondylitis, right 03/25/2021   Trigger thumb, right thumb 11/19/2020   Right wrist pain 10/23/2020   Osteoporosis 04/05/2020   Trigger finger, left middle finger 07/27/2019   Chronic neck pain 11/10/2018   Orthopedic hardware present 02/17/2018   Controlled diabetes mellitus type 2 with complications (HCC) 12/15/2017   Intractable chronic  migraine without aura and with status migrainosus 12/15/2017   Arthralgia 12/15/2017   Joint pain 12/15/2017   Migraine, transformed 12/15/2017   Chronic migraine without aura, with intractable migraine, so stated, with status migrainosus 12/15/2017   Pain of left shoulder joint on movement 10/19/2017   OSA (obstructive sleep apnea) 06/26/2017   Snoring 03/25/2016   Palpitations 03/25/2016   Headache 03/25/2016   Sensory disorder of trigeminal nerve 11/26/2015   Spondylolisthesis of lumbar region 08/09/2014   Lumbar adjacent segment disease with spondylolisthesis 08/09/2014   Back pain 02/17/2014   Macular degeneration 02/17/2014   Backache 02/17/2014    Other and unspecified hyperlipidemia 10/14/2013   Hyperlipidemia 10/14/2013   Onychomycosis 12/01/2012   Arthritis 04/12/2012   Hypothyroidism 04/12/2012    Past Medical History:  Diagnosis Date   Allergy    Asthma    Cancer (HCC)    thyroid  cancer   Cataract    Chronic migraine BOTOX INJECTION EVERY 3 MONTHS   Chronic pain in right shoulder    Constipation    Diabetes mellitus    Disorder of inner ear CAUSES VERTIGO OCCASIONALLY   Fibromyalgia    GERD (gastroesophageal reflux disease)    Hemorrhoid    History of bladder infections    History of thyroid  cancer 2009  S/P TOTAL THYROIDECTOMY AND RADIATION   Hyperlipidemia    Hypertension CARDIOLOGIST- DR Anastasia Balo- WILL REQUEST LATE NOTE   DENIES S & S   Hypothyroidism    Insomnia    Left shoulder pain    Macular degeneration    MRSA infection    nasal treated more than 15 years ago   Normal nuclear stress test 01/25/2009   OA (osteoarthritis) JOINTS   Osteopenia    Osteoporosis    Painful orthopaedic hardware (HCC)    left foot   PONV (postoperative nausea and vomiting) SEVERE   Rotator cuff disorder LEFT SHOULDER RTC IMPINGMENT   Sleep apnea    mouth guard, no cpap    Sleep apnea in adult 06/26/2017   Spondylolisthesis of lumbar region    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   Wears glasses     Past Surgical History:  Procedure Laterality Date   BACK SURGERY     Dr Larrie Po- spondylosis   BILATERAL CARPAL TUNNEL RELEASE  1994   BILATERAL ELBOW SURG.  1999   BILATERAL SALPINGOOPHECTOMY  1993   POST-OP URETER REPAIR 12 DAYS AFTER    CATARACT EXTRACTION, BILATERAL     COLONOSCOPY     EYE SURGERY     FOOT ARTHRODESIS Left 07/10/2016   Procedure: LEFT 2ND TARSAL METATARSAL ARTHRODESIS  GASTROC RECESSION LEFT LAPIDUS MODIFIED MCBRIDE BUIONECTOMY;  Surgeon: Amada Backer, MD;  Location: [facility];  Service: Orthopedics;  Laterality: Left;   GASTROC RECESSION EXTREMITY Left 07/10/2016   Procedure:  GASTROC RECESSION EXTREMITY;  Surgeon: Amada Backer, MD;  Location: [facility];  Service: Orthopedics;  Laterality: Left;   HARDWARE REMOVAL Left 05/20/2018   Procedure: Left foot removal of deep implants;  Surgeon: Amada Backer, MD;  Location: [facility];  Service: Orthopedics;  Laterality: Left;    LEFT THUMB JOINT REPLACEMENT  2005   RIGHT DONE IN 2004   OOPHORECTOMY     POLYPECTOMY     RIGHT KNEE ARTHROSCOPY  X2  BEFORE 2011   RIGHT KNEE ARTHROSCOPY/ PARTIAL LATERAL MENISECTOMY/ TRICOMPARTMENT CHONDROPLASTY/ DECOMPRESSION CYST  10-26-2009   RIGHT KNEE CLOSED MANIPULATION  09-11-2010   RIGHT  SHOULDER ARTHROSCOPY  2010  &  2004   TIBIA CYST REMOVED AND ORIF LEG FX  1958   TONSILLECTOMY  1968   TOTAL KNEE ARTHROPLASTY  07-16-2010   RIGHT   TOTAL THYROIDECTOMY  2009   CANCER  (POST-OP BLEED)  AND RADIATION TX   trigger fingers Right 2013   3rd and 4th fingers   UPPER GASTROINTESTINAL ENDOSCOPY     VAGINAL HYSTERECTOMY  1990    Social History   Tobacco Use   Smoking status: Never   Smokeless tobacco: Never  Vaping Use   Vaping status: Never Used  Substance Use Topics   Alcohol use: Yes    Comment: RARE   Drug use: No    Family History  Problem Relation Age of Onset   Alzheimer's disease Mother    Heart disease Father    Cirrhosis Father    Heart attack Father    Macular degeneration Father    Colon cancer Neg Hx    Esophageal cancer Neg Hx    Rectal cancer Neg Hx    Stomach cancer Neg Hx    Colon polyps Neg Hx     Allergies  Allergen Reactions   Sulfa Antibiotics Hives   Statins Other (See Comments)    Pt has tried multiple and cannot tolerate    Medication list has been reviewed and updated.  Current Outpatient Medications on File Prior to Visit  Medication Sig Dispense Refill   acetaminophen  (TYLENOL ) 500 MG tablet Take 500 mg by mouth every 6 (six) hours as needed.     Albuterol  Sulfate (PROAIR  RESPICLICK) 108 (90  Base) MCG/ACT AEPB Inhale 2 puffs every 4-6 hours as needed for wheezing or shortness of breath 1 each 12   alendronate  (FOSAMAX ) 70 MG tablet Take 1 tablet (70 mg total) by mouth every 7 (seven) days. Take with a full glass of water on an empty stomach. 4 tablet 11   Atogepant  (QULIPTA ) 60 MG TABS Take 1 tablet (60 mg total) by mouth daily. 30 tablet 0   Atogepant  (QULIPTA ) 60 MG TABS Take 1 tablet (60 mg total) by mouth daily. 90 tablet 3   blood glucose meter kit and supplies KIT Test blood sugar once daily. Dx code: E11.9 1 each 0   Blood Glucose Monitoring Suppl (FREESTYLE LITE) DEVI Check blood sugar no more than twice daily 1 each 0   Blood Glucose Monitoring Suppl (ONE TOUCH ULTRA 2) w/Device KIT Dispense one meter and test strips, lancets- 100 of each with PRN RF 1 kit 0   clopidogrel  (PLAVIX ) 75 MG tablet Take 1 tablet (75 mg total) by mouth daily. 90 tablet 3   Cranberry 125 MG TABS Take by mouth.     dicyclomine  (BENTYL ) 20 MG tablet Take 1 tablet (20 mg total) by mouth 4 (four) times daily as needed for spasms. 60 tablet 1   estradiol  (ESTRACE ) 0.1 MG/GM vaginal cream Place 1 Applicatorful vaginally 3 (three) times a week. 42.5 g 12   Evolocumab  (REPATHA  SURECLICK) 140 MG/ML SOAJ Inject 140 mg into the skin every 14 (fourteen) days. 6 mL 0   Fexofenadine HCl (ALLEGRA PO) Take by mouth.     fluticasone  (FLOVENT  HFA) 110 MCG/ACT inhaler Inhale 1-2 puffs into the lungs 2 (two) times daily. 1 each 12   glucose blood (FREESTYLE LITE) test strip CHECK BLOOD SUGARS NO MORE THAN TWICE DAILY 100 each 12   lactobacillus acidophilus (BACID) TABS tablet Take 2 tablets by mouth 3 (three) times  daily.     Lancets (FREESTYLE) lancets Check blood sugars no more than twice daily 100 each 12   levothyroxine  (SYNTHROID ) 125 MCG tablet TAKE 1 TABLET BY MOUTH BEFORE BREAKFAST 90 tablet 3   losartan  (COZAAR ) 25 MG tablet Take 1 tablet (25 mg total) by mouth daily. 90 tablet 3   Magnesium 400 MG CAPS Take  by mouth.     metFORMIN  (GLUCOPHAGE ) 500 MG tablet Take 1 tablet (500 mg total) by mouth daily. 90 tablet 3   methocarbamol  (ROBAXIN ) 500 MG tablet Take 1 tablet (500 mg total) by mouth every 8 (eight) hours as needed for muscle spasms. 30 tablet 0   metoprolol  succinate (TOPROL -XL) 50 MG 24 hr tablet Take 1 tablet (50 mg total) by mouth daily. Take with or immediately following a meal (Patient taking differently: Take 25 mg by mouth daily. Take with or immediately following a meal) 90 tablet 1   montelukast  (SINGULAIR ) 10 MG tablet Take 1 tablet (10 mg total) by mouth at bedtime. 90 tablet 3   ondansetron  (ZOFRAN -ODT) 8 MG disintegrating tablet Take 8 mg by mouth every 8 (eight) hours as needed for nausea or vomiting.     Oxycodone  HCl 10 MG TABS Take 1-1.5 tablets (10-15 mg total) by mouth every 8 (eight) hours as needed. 60 tablet 0   Polyethylene Glycol 3350  (MIRALAX PO) Take by mouth.     Rimegepant Sulfate (NURTEC) 75 MG TBDP Take 1 tablet by mouth as needed (pain).     Semaglutide , 1 MG/DOSE, 4 MG/3ML SOPN Inject 1 mg into the skin once a week. 9 mL 3   sodium chloride  0.9 % SOLN 100 mL with Eptinezumab-jjmr 100 MG/ML SOLN 100 mg Inject 100 mg into the vein every 3 (three) months. 100 mg 5   traZODone  (DESYREL ) 100 MG tablet TAKE 2 AND 1/2 TABLETS(250 MG) BY MOUTH AT BEDTIME 225 tablet 1   zolpidem  (AMBIEN ) 5 MG tablet TAKE 1 TABLET(5 MG) BY MOUTH AT BEDTIME AS NEEDED FOR SLEEP 90 tablet 1   ciprofloxacin  (CIPRO ) 500 MG tablet Take 1 tablet (500 mg total) by mouth every 12 (twelve) hours. (Patient not taking: Reported on 12/10/2023) 10 tablet 1   No current facility-administered medications on file prior to visit.    Review of Systems:  As per HPI- otherwise negative.   Physical Examination: Vitals:   12/10/23 1555  BP: 128/74  Pulse: 76  Resp: 16  Temp: 98.6 F (37 C)  SpO2: 95%   Vitals:   12/10/23 1555  Weight: 161 lb 12.8 oz (73.4 kg)  Height: 5\' 8"  (1.727 m)   Body  mass index is 24.6 kg/m. Ideal Body Weight: Weight in (lb) to have BMI = 25: 164.1  GEN: no acute distress.  Normal weight, looks well HEENT: Atraumatic, Normocephalic.  Ears and Nose: No external deformity. CV: RRR, No M/G/R. No JVD. No thrill. No extra heart sounds. PULM: CTA B, no wheezes, crackles, rhonchi. No retractions. No resp. distress. No accessory muscle use. ABD: S, NT, ND, +BS. No rebound. No HSM. I am not able to reproduce her chest pain on exam EXTR: No c/c/e PSYCH: Normally interactive. Conversant.   EKG:  SR with partial right bundle Compared with EKG from one year ago very similar  Assessment and Plan: Diabetes mellitus type 2 with complications (HCC) - Plan: Comprehensive metabolic panel with GFR, Hemoglobin A1c  Acquired hypothyroidism - Plan: TSH  Chest pain, unspecified type - Plan: EKG 12-Lead  Patient seen today  for follow-up.  She is using Ozempic  as well as metformin  for diabetes, check A1c today  Due for follow-up with thyroid , check TSH today  She notes atypical chest pain; I agree this seems likely benign.  However she does have risk factors including history of hyperlipidemia as well as diabetes.  She does have a cardiologist-Dr. Filiberto Hug.  I will touch base with him and see if he would like to bring her in for follow-up-most recent visit just over 6 months ago She did have a negative Lexiscan  1 year ago which is reassuring  She will let me know if anything is changing or getting worse in the meantime  Follow-up regarding tetanus and colon cancer screening with her labs Also make sure she has had her Shingrix and eye exam  Signed Gates Kasal, MD  Addnd 5/16- received labs, message to pt  Results for orders placed or performed in visit on 12/10/23  Comprehensive metabolic panel with GFR   Collection Time: 12/10/23  4:13 PM  Result Value Ref Range   Sodium 138 135 - 145 mEq/L   Potassium 4.1 3.5 - 5.1 mEq/L   Chloride 99 96 - 112 mEq/L    CO2 29 19 - 32 mEq/L   Glucose, Bld 145 (H) 70 - 99 mg/dL   BUN 15 6 - 23 mg/dL   Creatinine, Ser 4.40 0.40 - 1.20 mg/dL   Total Bilirubin 0.3 0.2 - 1.2 mg/dL   Alkaline Phosphatase 69 39 - 117 U/L   AST 19 0 - 37 U/L   ALT 20 0 - 35 U/L   Total Protein 6.6 6.0 - 8.3 g/dL   Albumin 4.2 3.5 - 5.2 g/dL   GFR 10.27 >25.36 mL/min   Calcium  9.2 8.4 - 10.5 mg/dL  Hemoglobin U4Q   Collection Time: 12/10/23  4:13 PM  Result Value Ref Range   Hgb A1c MFr Bld 7.2 (H) 4.6 - 6.5 %   *Note: Due to a large number of results and/or encounters for the requested time period, some results have not been displayed. A complete set of results can be found in Results Review.

## 2023-12-09 ENCOUNTER — Ambulatory Visit: Admitting: Family Medicine

## 2023-12-10 ENCOUNTER — Ambulatory Visit: Admitting: Family Medicine

## 2023-12-10 VITALS — BP 128/74 | HR 76 | Temp 98.6°F | Resp 16 | Ht 68.0 in | Wt 161.8 lb

## 2023-12-10 DIAGNOSIS — Z7985 Long-term (current) use of injectable non-insulin antidiabetic drugs: Secondary | ICD-10-CM | POA: Diagnosis not present

## 2023-12-10 DIAGNOSIS — E039 Hypothyroidism, unspecified: Secondary | ICD-10-CM

## 2023-12-10 DIAGNOSIS — R079 Chest pain, unspecified: Secondary | ICD-10-CM | POA: Diagnosis not present

## 2023-12-10 DIAGNOSIS — E118 Type 2 diabetes mellitus with unspecified complications: Secondary | ICD-10-CM

## 2023-12-11 ENCOUNTER — Encounter: Payer: Self-pay | Admitting: Family Medicine

## 2023-12-11 LAB — COMPREHENSIVE METABOLIC PANEL WITH GFR
ALT: 20 U/L (ref 0–35)
AST: 19 U/L (ref 0–37)
Albumin: 4.2 g/dL (ref 3.5–5.2)
Alkaline Phosphatase: 69 U/L (ref 39–117)
BUN: 15 mg/dL (ref 6–23)
CO2: 29 meq/L (ref 19–32)
Calcium: 9.2 mg/dL (ref 8.4–10.5)
Chloride: 99 meq/L (ref 96–112)
Creatinine, Ser: 0.7 mg/dL (ref 0.40–1.20)
GFR: 85.2 mL/min (ref >60.00)
Glucose, Bld: 145 mg/dL — ABNORMAL HIGH (ref 70–99)
Potassium: 4.1 meq/L (ref 3.5–5.1)
Sodium: 138 meq/L (ref 135–145)
Total Bilirubin: 0.3 mg/dL (ref 0.2–1.2)
Total Protein: 6.6 g/dL (ref 6.0–8.3)

## 2023-12-11 LAB — HEMOGLOBIN A1C: Hgb A1c MFr Bld: 7.2 % — ABNORMAL HIGH (ref 4.6–6.5)

## 2023-12-13 ENCOUNTER — Other Ambulatory Visit: Payer: Self-pay | Admitting: Family Medicine

## 2023-12-14 ENCOUNTER — Other Ambulatory Visit: Payer: Self-pay | Admitting: Family Medicine

## 2023-12-16 ENCOUNTER — Ambulatory Visit: Admitting: Sports Medicine

## 2023-12-19 ENCOUNTER — Other Ambulatory Visit: Payer: Self-pay | Admitting: Family Medicine

## 2023-12-19 DIAGNOSIS — E782 Mixed hyperlipidemia: Secondary | ICD-10-CM

## 2023-12-22 ENCOUNTER — Ambulatory Visit: Payer: Self-pay | Admitting: Sports Medicine

## 2023-12-22 ENCOUNTER — Other Ambulatory Visit (INDEPENDENT_AMBULATORY_CARE_PROVIDER_SITE_OTHER)

## 2023-12-22 ENCOUNTER — Ambulatory Visit (INDEPENDENT_AMBULATORY_CARE_PROVIDER_SITE_OTHER): Admitting: Sports Medicine

## 2023-12-22 ENCOUNTER — Ambulatory Visit

## 2023-12-22 DIAGNOSIS — M79662 Pain in left lower leg: Secondary | ICD-10-CM

## 2023-12-22 DIAGNOSIS — M1712 Unilateral primary osteoarthritis, left knee: Secondary | ICD-10-CM

## 2023-12-22 MED ORDER — TRIAMCINOLONE ACETONIDE 40 MG/ML IJ SUSP
40.0000 mg | Freq: Once | INTRAMUSCULAR | Status: AC
  Administered 2023-12-22: 40 mg via INTRA_ARTICULAR

## 2023-12-22 NOTE — Assessment & Plan Note (Addendum)
 Pain of left knee, history of right knee arthroplasty, known left knee osteoarthritis last injected in February, repeat left knee injection today. She is having some calf pain so we will add a DVT ultrasound downstairs. If this does not give at least 3 to 6 months of relief we will consider starting Visco approval.

## 2023-12-22 NOTE — Addendum Note (Signed)
 Addended by: Montgomery Apgar on: 12/22/2023 11:26 AM   Modules accepted: Orders

## 2023-12-22 NOTE — Progress Notes (Signed)
    Procedures performed today:    Procedure: Real-time Ultrasound Guided injection of the left knee Device: Samsung HS60  Verbal informed consent obtained.  Time-out conducted.  Noted no overlying erythema, induration, or other signs of local infection.  Skin prepped in a sterile fashion.  Local anesthesia: Topical Ethyl chloride.  With sterile technique and under real time ultrasound guidance: effusion noted, 1 cc Kenalog  40, 2 cc lidocaine , 2 cc bupivacaine  injected easily Completed without difficulty  Advised to call if fevers/chills, erythema, induration, drainage, or persistent bleeding.  Images permanently stored and available for review in PACS.  Impression: Technically successful ultrasound guided injection.  Independent interpretation of notes and tests performed by another provider:   None.  Brief History, Exam, Impression, and Recommendations:    Primary osteoarthritis of left knee Pain of left knee, history of right knee arthroplasty, known left knee osteoarthritis last injected in February, repeat left knee injection today. She is having some calf pain so we will add a DVT ultrasound downstairs. If this does not give at least 3 to 6 months of relief we will consider starting Visco approval.    ____________________________________________ Joselyn Nicely. Sandy Crumb, M.D., ABFM., CAQSM., AME. Primary Care and Sports Medicine [facility]  Adjunct Professor of [place] [facility] Complex Medicine  [employer]  Restaurant manager, fast food

## 2023-12-23 ENCOUNTER — Encounter: Payer: Self-pay | Admitting: Sports Medicine

## 2023-12-23 ENCOUNTER — Ambulatory Visit: Attending: Cardiology | Admitting: Cardiology

## 2023-12-23 ENCOUNTER — Encounter: Payer: Self-pay | Admitting: Cardiology

## 2023-12-23 VITALS — BP 114/70 | HR 83 | Ht 68.0 in | Wt 161.2 lb

## 2023-12-23 DIAGNOSIS — I2510 Atherosclerotic heart disease of native coronary artery without angina pectoris: Secondary | ICD-10-CM

## 2023-12-23 DIAGNOSIS — R931 Abnormal findings on diagnostic imaging of heart and coronary circulation: Secondary | ICD-10-CM | POA: Diagnosis not present

## 2023-12-23 DIAGNOSIS — R072 Precordial pain: Secondary | ICD-10-CM | POA: Diagnosis not present

## 2023-12-23 DIAGNOSIS — E782 Mixed hyperlipidemia: Secondary | ICD-10-CM | POA: Diagnosis not present

## 2023-12-23 NOTE — Progress Notes (Signed)
  Cardiology Office Note:  .   Date:  12/23/2023  ID:  Debbie Hayes, DOB 1951-01-07, MRN [id number] PCP: Kaylee Partridge, MD  [facility] Providers Cardiologist:  Fransico Ivy, MD PCP: Kaylee Partridge, MD  Chief Complaint  Patient presents with   Hyperlipidemia   Follow-up     Debbie Hayes is a 73 y.o. female with type 2 DM, controlled hyperlipidemia, elevated CAC   History of Present Illness  Patient has recently had pain under her breast both on right and left side, occurring usually at rest, lasting for a few min, improving with Robaxan at times. There is no associated shortness of breath, diaphoresis. While her physically activity is limited due to back pain, this chest pain never occurs with physical activity such as walking in the stores etc.   Patient is compliant with Repatha , LDL is down to 61. Her last stress test in 10/2023 showed no ischemia. She is on Plavix , as Aspirin  caused GI side effects.     Vitals:   12/23/23 0929  BP: 114/70  Pulse: 83  SpO2: 96%      Review of Systems  Cardiovascular:  Positive for chest pain. Negative for dyspnea on exertion, leg swelling, palpitations and syncope.        Studies Reviewed: Debbie Hayes        EKG 12/23/2023: Sinus rhythm 72 bpm Incomplete right bundle branch block  Labs 11/2023: Chol 163, TG 194, HDL 62, LDL 61 HbA1C 7.2% Hb 15.1 Cr 0.7  Physical Exam Vitals and nursing note reviewed.  Constitutional:      General: She is not in acute distress. Neck:     Vascular: No JVD.  Cardiovascular:     Rate and Rhythm: Normal rate and regular rhythm.     Heart sounds: Normal heart sounds. No murmur heard. Pulmonary:     Effort: Pulmonary effort is normal.     Breath sounds: Normal breath sounds. No wheezing or rales.  Musculoskeletal:     Right lower leg: No edema.     Left lower leg: No edema.      VISIT DIAGNOSES:   ICD-10-CM   1. Coronary artery disease involving native  coronary artery of native heart without angina pectoris  I25.10     2. Precordial pain  R07.2     3. Mixed hyperlipidemia  E78.2     4. Agatston coronary artery calcium  score greater than 400  R93.1        Debbie Hayes is a 73 y.o. female with type 2 DM, controlled hyperlipidemia, elevated CAC  Assessment & Plan  Chest pain: By history, appears noncardiac, likely musculoskeletal. Reassuring stress test with no ischemia (10/2023). Currently on palvix (due to Aspirin  intolerance), and Repatha  (due to statin myalgias), with LDL down to 61. Overall, I do not think she needs repeats ischemia testing at this time. I will reassess this in 3 months to further ensure no CAD component.  Elevated CAC: 92nd percentile, including calcium  237 in left main. No ischemia on stress testing (10/2022). Known statin myopathy. Continue Repatha .   Hypertension: Controlled.    F/u in 3 months  Signed, Cody Das, MD

## 2023-12-23 NOTE — Patient Instructions (Signed)
 Follow-Up: At [facility], you and your health needs are our priority.  As part of our continuing mission to provide you with exceptional heart care, our providers are all part of one team.  This team includes your primary Cardiologist (physician) and Advanced Practice Providers or APPs (Physician Assistants and Nurse Practitioners) who all work together to provide you with the care you need, when you need it.  Your next appointment:   3 month(s)  Provider:   Cody Das, MD

## 2023-12-25 ENCOUNTER — Ambulatory Visit: Admitting: Sports Medicine

## 2023-12-28 ENCOUNTER — Encounter: Payer: Self-pay | Admitting: Family Medicine

## 2023-12-28 DIAGNOSIS — G8929 Other chronic pain: Secondary | ICD-10-CM

## 2023-12-28 MED ORDER — OXYCODONE HCL 10 MG PO TABS: 10.0000 mg | ORAL_TABLET | Freq: Three times a day (TID) | ORAL | 0 refills | Status: DC | PRN

## 2023-12-29 ENCOUNTER — Ambulatory Visit: Payer: Medicare Other | Admitting: Neurology

## 2023-12-29 ENCOUNTER — Encounter: Payer: Self-pay | Admitting: Neurology

## 2023-12-29 ENCOUNTER — Telehealth: Payer: Self-pay | Admitting: Pharmacy Technician

## 2023-12-29 ENCOUNTER — Other Ambulatory Visit (HOSPITAL_COMMUNITY): Payer: Self-pay

## 2023-12-29 VITALS — BP 106/60 | HR 72 | Ht 68.0 in | Wt 163.0 lb

## 2023-12-29 DIAGNOSIS — G43711 Chronic migraine without aura, intractable, with status migrainosus: Secondary | ICD-10-CM

## 2023-12-29 NOTE — Telephone Encounter (Signed)
 Pharmacy Patient Advocate Encounter   Received notification from CoverMyMeds that prior authorization for oxyCODONE  HCl 10MG  tablets is required/requested.   Insurance verification completed.   The patient is insured through [facility] .   Per test claim: PA required and submitted KEY/EOC/Request #: [id number] CANCELLED due to No PA allowed. Spoke to pharmacy and they will use a discount card for $18.00 to receive full quantity.

## 2023-12-29 NOTE — Progress Notes (Unsigned)
 [facility]    Provider:  Dr Tresia Fruit Requesting Provider: Geralyn Knee, Skipper Dumas, MD Primary Care Provider:  Kaylee Partridge, MD  CC:  Migraine  01/01/2024: Debbie Hayes patient came in today to tell us  she is doing better and happy.   Patient with chronic migraines failed multiple medications 50% improvement she went from 18 a month to 9 a month. She takes Nurtec when she get the migraines and doing great on qulipta . States she will continue and hopes it continues to help. She still has 9 migraines but they are not as severe and she is happy. We discussed other options like vyepti for which we had approval in the past.  No other focal neurologic deficits, associated symptoms, inciting events or modifiable factors.  Patient complains of symptoms per HPI as well as the following symptoms: none . Pertinent negatives and positives per HPI. All others negative   09/09/2023: Last time we saw her she was doing well. Over the last 3 months having 12-16 migraine days a month and a total of 20-25 total headache days a month They that are pulsating/pounding/throbbing, 98% in the left temples, significant light sensitivity also gets lights nausea mostly in the past, photophobia/phonophobia, moderate to severe mograines, no aura,no medication overuse, hurts to move, helps in a dark room, light worsening and exquisite light sensitivy has to wear a visor on the left side of the head and even during church due to the lighting, she is suffering, she gave up citrus, nitrites, watched her diet, gave up any trigers, watching diet, its debilitating her life, they can last 24 hours but can last all day, she wears a mouth guard and she was tested with the mouthguard and is compliant,   failed Emgality /Ajovy /Aimovig  and botox for migraines 5 injections, never tried vyepti(qulipta  also possibly but feel vyepti would be morst effective), qulipta  have tried will give her samples and stop when vyepti approved She  would like a referral to [facility] headache center we sent referral 10/2018 she states she never went Start Vyepti for preventative - have started approval proess - she was never infusion  Topiramate  XR - had worked, not working anymore, cannot tolerate increase Ubrelvy  for acute management and given nurtec helps but not always NO medication overuse Has had sleep evaluation/study and is compliant MRI brain normal  Continue to follow for for sleep Continue Skelaxone for neck pain didn't help Sleeping difficulty (takes, ambien , trazodone ) Takes nurtec.   Migraine preventative meds tried and failed > 3 months: Botox 5 injections (also tried this in the past), Emgality /Ajovy /Aimovig , Did not tolerate Topiramate  IR and ER lost effectiveness, metoprolol , atenolol, depakote, effexor, and multiple other first, second and third line preventatives and acute medications over the many years of migraines, imitrex , maxalt  and other triptans, ubrelvy , nurtec, [facility] and other physicians/neurologists. Tried depakote, zonegran.  Reviewed images and agree:  CLINICAL DATA:  Initial evaluation for left-sided facial tingling. History of migraines.  EXAM: MRI HEAD WITHOUT CONTRAST  MRA HEAD WITHOUT CONTRAST  MRA NECK WITHOUT AND WITH CONTRAST  ...        MR Angiogram Neck W Wo Contrast Final result 10/24/2015 12:05 AM    Narrative  CLINICAL DATA:  Initial evaluation for left-sided facial tingling. History of migraines.  EXAM: MRI HEAD WITHOUT CONTRAST  MRA HEAD WITHOUT CONTRAST  MRA NECK WITHOUT AND WITH CONTRAST     CLINICAL DATA:  Initial evaluation for left-sided facial tingling.  History of migraines.   EXAM:  MRI HEAD WITHOUT CONTRAST   MRA HEAD WITHOUT CONTRAST   MRA NECK WITHOUT AND WITH CONTRAST   Reviewed bloodowrk     Latest Ref Rng & Units 09/09/2023    3:55 PM 03/23/2023    3:49 PM 08/07/2022    3:55 PM  CBC  WBC 4.0 - 10.5 K/uL 7.2   6.1  7.1   Hemoglobin 12.0 - 15.0 g/dL 16.1  09.6  04.5   Hematocrit 36.0 - 46.0 % 45.7  45.0  45.6   Platelets 150.0 - 400.0 K/uL 207.0  212.0  193.0        Latest Ref Rng & Units 12/10/2023    4:13 PM 09/09/2023    3:55 PM 03/23/2023    3:49 PM  CMP  Glucose 70 - 99 mg/dL 409  811  914   BUN 6 - 23 mg/dL 15  15  16    Creatinine 0.40 - 1.20 mg/dL 7.82  9.56  2.13   Sodium 135 - 145 mEq/L 138  139  141   Potassium 3.5 - 5.1 mEq/L 4.1  3.7  3.7   Chloride 96 - 112 mEq/L 99  103  105   CO2 19 - 32 mEq/L 29  25  27    Calcium  8.4 - 10.5 mg/dL 9.2  8.5  9.0   Total Protein 6.0 - 8.3 g/dL 6.6     Total Bilirubin 0.2 - 1.2 mg/dL 0.3     Alkaline Phos 39 - 117 U/L 69     AST 0 - 37 U/L 19     ALT 0 - 35 U/L 20        HPI 02/22/2019:  Debbie Hayes is a 73 y.o. female here as requested by Copland, Skipper Dumas, MD for migraines. SHE IS DOING EXCEPTIONALLY WELL on Topiramate  XR. She has not used her triptans. Extremely well. She is doing well with sleep apnea, using a dental device, she is not snoring.     Last visit April 2020:   Patient is not doing well. She is having 15-18 migraine days a month. We will stop botox. She has failed all three CGRPs. She has chronic pain and takes oxycodone  sparingly. She has chronic neck pain and take skelaxone. She has sleep-related headaches and snoring and has followed with Dr. Gaile Jourdain, our sleep specialist and had sleep study.  failed Emgality /Ajovy /Aimovig  She would like a referral to [facility] headache center Start Vyepti for preventative - have started approval proess Start Topiramate  XR - she does not remember what happened when she tried Topiramate  it was years ago will try again Ubrelvy  for acute management Start topiramate  ER  NO medication overuse Has had sleep evaluation/study MRI brain normal Continue to follow with Dr. Albertina Hugger for sleep Continue Skelaxone for neck pain  Migraine preventative meds tried and failed: Botox 5 injections  (also tried this in the past), Emgality /Ajovy /Aimovig , Did not tolerate Topiramate  IR in the past will try ER, metoprolol , atenolol, depakote, effexor, and multiple other first, second and third line preventatives and acute medications over the many years of migraines, has [facility] and other physicians/neurologists. She has not been on Depakote or Zonegran, unsure of the TCAs.  No orders of the defined types were placed in this encounter.  No orders of the defined types were placed in this encounter.  A total of 25 minutes was spent face-to-face with this patient. Over half this time  was spent on counseling patient on the  No diagnosis found. diagnosis and different diagnostic and therapeutic options, counseling and coordination of care, risks ans benefits of management, compliance, or risk factor reduction and education.  This does not inclde time spent on botox procedure.   Review of Systems: Patient complains of symptoms per HPI as well as the following symptoms: migraine. Pertinent negatives and positives per HPI. All others negative.   Social History   Socioeconomic History   Marital status: Married    Spouse name: Not on file   Number of children: 4   Years of education: Not on file   Highest education level: Bachelor's degree (e.g., BA, AB, BS)  Occupational History    Comment: retired Charity fundraiser  Tobacco Use   Smoking status: Never   Smokeless tobacco: Never  Vaping Use   Vaping status: Never Used  Substance and Sexual Activity   Alcohol use: Yes    Comment: RARE   Drug use: No   Sexual activity: Not on file  Other Topics Concern   Not on file  Social History Narrative   Lives with husband Dr Marcine Severin.   They have 2 adult children that live out of state.   Homemaker    Social Drivers of Health   Financial Resource Strain: Low Risk  (06/14/2023)   Overall Financial Resource Strain (CARDIA)    Difficulty of Paying Living Expenses:  Not hard at all  Food Insecurity: No Food Insecurity (06/14/2023)   Hunger Vital Sign    Worried About Running Out of Food in the Last Year: Never true    Ran Out of Food in the Last Year: Never true  Transportation Needs: No Transportation Needs (06/14/2023)   PRAPARE - Administrator, Civil Service (Medical): No    Lack of Transportation (Non-Medical): No  Physical Activity: Inactive (06/14/2023)   Exercise Vital Sign    Days of Exercise per Week: 0 days    Minutes of Exercise per Session: 0 min  Stress: No Stress Concern Present (06/14/2023)   [employer] of Occupational Health - Occupational Stress Questionnaire    Feeling of Stress : Only a little  Social Connections: Socially Integrated (06/14/2023)   Social Connection and Isolation Panel [NHANES]    Frequency of Communication with Friends and Family: More than three times a week    Frequency of Social Gatherings with Friends and Family: More than three times a week    Attends Religious Services: More than 4 times per year    Active Member of [employer] or Organizations: Yes    Attends Engineer, structural: More than 4 times per year    Marital Status: Married  Catering manager Violence: Not At Risk (01/15/2022)   Humiliation, Afraid, Rape, and Kick questionnaire    Fear of Current or Ex-Partner: No    Emotionally Abused: No    Physically Abused: No    Sexually Abused: No    Family History  Problem Relation Age of Onset   Alzheimer's disease Mother    Migraines Mother    Heart disease Father    Cirrhosis Father    Heart attack Father    Macular degeneration Father    Migraines Sister    Migraines Maternal Grandmother    Migraines Niece    Colon cancer Neg Hx    Esophageal cancer Neg Hx    Rectal cancer Neg Hx    Stomach cancer Neg Hx    Colon polyps Neg  Hx     Past Medical History:  Diagnosis Date   Allergy    Asthma    Cancer (HCC)    thyroid  cancer   Cataract    Chronic migraine  BOTOX INJECTION EVERY 3 MONTHS   Chronic pain in right shoulder    Constipation    Diabetes mellitus    Disorder of inner ear CAUSES VERTIGO OCCASIONALLY   Fibromyalgia    GERD (gastroesophageal reflux disease)    Hemorrhoid    History of bladder infections    History of thyroid  cancer 2009  S/P TOTAL THYROIDECTOMY AND RADIATION   Hyperlipidemia    Hypertension CARDIOLOGIST- DR Anastasia Balo- WILL REQUEST LATE NOTE   DENIES S & S   Hypothyroidism    Insomnia    Left shoulder pain    Macular degeneration    MRSA infection    nasal treated more than 15 years ago   Normal nuclear stress test 01/25/2009   OA (osteoarthritis) JOINTS   Osteopenia    Osteoporosis    Painful orthopaedic hardware (HCC)    left foot   PONV (postoperative nausea and vomiting) SEVERE   Rotator cuff disorder LEFT SHOULDER RTC IMPINGMENT   Sleep apnea    mouth guard, no cpap    Sleep apnea in adult 06/26/2017   Spondylolisthesis of lumbar region    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   Wears glasses     Patient Active Problem List   Diagnosis Date Noted   Precordial pain 12/23/2023   Primary osteoarthritis of left knee 09/23/2023   History of arthroplasty of right knee 07/03/2023   Primary osteoarthritis of right shoulder 07/03/2023   Coronary artery disease involving native coronary artery of native heart without angina pectoris 05/25/2023   Agatston coronary artery calcium  score greater than 400 11/06/2022   Statin myopathy 11/06/2022   Frequent UTI 10/29/2022   Wart of hand 09/30/2022   Acute cystitis without hematuria 05/14/2021   Medial epicondylitis, right 03/25/2021   Trigger thumb, right thumb 11/19/2020   Right wrist pain 10/23/2020   Osteoporosis 04/05/2020   Trigger finger, left middle finger 07/27/2019   Chronic neck pain 11/10/2018   Orthopedic hardware present 02/17/2018   Controlled diabetes mellitus type 2 with complications (HCC) 12/15/2017   Intractable chronic migraine without  aura and with status migrainosus 12/15/2017   Arthralgia 12/15/2017   Joint pain 12/15/2017   Migraine, transformed 12/15/2017   Chronic migraine without aura, with intractable migraine, so stated, with status migrainosus 12/15/2017   Pain of left shoulder joint on movement 10/19/2017   OSA (obstructive sleep apnea) 06/26/2017   Snoring 03/25/2016   Palpitations 03/25/2016   Headache 03/25/2016   Sensory disorder of trigeminal nerve 11/26/2015   Spondylolisthesis of lumbar region 08/09/2014   Lumbar adjacent segment disease with spondylolisthesis 08/09/2014   Back pain 02/17/2014   Macular degeneration 02/17/2014   Backache 02/17/2014   Other and unspecified hyperlipidemia 10/14/2013   Hyperlipidemia 10/14/2013   Onychomycosis 12/01/2012   Arthritis 04/12/2012   Hypothyroidism 04/12/2012    Past Surgical History:  Procedure Laterality Date   BACK SURGERY     Dr Larrie Po- spondylosis   BILATERAL CARPAL TUNNEL RELEASE  1994   BILATERAL ELBOW SURG.  1999   BILATERAL SALPINGOOPHECTOMY  1993   POST-OP URETER REPAIR 12 DAYS AFTER    CATARACT EXTRACTION, BILATERAL     COLONOSCOPY     EYE SURGERY     FOOT ARTHRODESIS Left 07/10/2016   Procedure: LEFT  2ND TARSAL METATARSAL ARTHRODESIS  GASTROC RECESSION LEFT LAPIDUS MODIFIED MCBRIDE BUIONECTOMY;  Surgeon: Amada Backer, MD;  Location: [facility];  Service: Orthopedics;  Laterality: Left;   GASTROC RECESSION EXTREMITY Left 07/10/2016   Procedure: GASTROC RECESSION EXTREMITY;  Surgeon: Amada Backer, MD;  Location: [facility];  Service: Orthopedics;  Laterality: Left;   HARDWARE REMOVAL Left 05/20/2018   Procedure: Left foot removal of deep implants;  Surgeon: Amada Backer, MD;  Location: [facility];  Service: Orthopedics;  Laterality: Left;    LEFT THUMB JOINT REPLACEMENT  2005   RIGHT DONE IN 2004   OOPHORECTOMY     POLYPECTOMY     RIGHT KNEE ARTHROSCOPY  X2  BEFORE 2011   RIGHT KNEE  ARTHROSCOPY/ PARTIAL LATERAL MENISECTOMY/ TRICOMPARTMENT CHONDROPLASTY/ DECOMPRESSION CYST  10/26/2009   RIGHT KNEE CLOSED MANIPULATION  09/11/2010   RIGHT SHOULDER ARTHROSCOPY  2010  &  2004   TIBIA CYST REMOVED AND ORIF LEG FX  1958   TONSILLECTOMY  1968   TOTAL KNEE ARTHROPLASTY  07/16/2010   RIGHT   TOTAL THYROIDECTOMY  2009   CANCER  (POST-OP BLEED)  AND RADIATION TX   trigger finger repair      07/2023   trigger fingers Right 2013   3rd and 4th fingers   UPPER GASTROINTESTINAL ENDOSCOPY     VAGINAL HYSTERECTOMY  1990    Current Outpatient Medications  Medication Sig Dispense Refill   acetaminophen  (TYLENOL ) 500 MG tablet Take 500 mg by mouth every 6 (six) hours as needed.     Albuterol  Sulfate (PROAIR  RESPICLICK) 108 (90 Base) MCG/ACT AEPB Inhale 2 puffs every 4-6 hours as needed for wheezing or shortness of breath 1 each 12   alendronate  (FOSAMAX ) 70 MG tablet TAKE 1 TABLET(70 MG) BY MOUTH EVERY 7 DAYS WITH A FULL GLASS OF WATER AND ON AN EMPTY STOMACH 12 tablet 1   Atogepant  (QULIPTA ) 60 MG TABS Take 1 tablet (60 mg total) by mouth daily. 90 tablet 3   blood glucose meter kit and supplies KIT Test blood sugar once daily. Dx code: E11.9 1 each 0   Blood Glucose Monitoring Suppl (FREESTYLE LITE) DEVI Check blood sugar no more than twice daily 1 each 0   Blood Glucose Monitoring Suppl (ONE TOUCH ULTRA 2) w/Device KIT Dispense one meter and test strips, lancets- 100 of each with PRN RF 1 kit 0   clopidogrel  (PLAVIX ) 75 MG tablet Take 1 tablet (75 mg total) by mouth daily. 90 tablet 3   Cranberry 125 MG TABS Take by mouth.     dicyclomine  (BENTYL ) 20 MG tablet Take 1 tablet (20 mg total) by mouth 4 (four) times daily as needed for spasms. 60 tablet 1   estradiol  (ESTRACE ) 0.1 MG/GM vaginal cream Place 1 Applicatorful vaginally 3 (three) times a week. 42.5 g 12   Evolocumab  (REPATHA  SURECLICK) 140 MG/ML SOAJ Inject 140 mg into the skin every 14 (fourteen) days. 6 mL 0   famotidine   (PEPCID ) 40 MG tablet Take 40 mg by mouth daily.     Fexofenadine HCl (ALLEGRA PO) Take by mouth.     fluticasone  (FLOVENT  HFA) 110 MCG/ACT inhaler Inhale 1-2 puffs into the lungs 2 (two) times daily. 1 each 12   glucose blood (FREESTYLE LITE) test strip CHECK BLOOD SUGARS NO MORE THAN TWICE DAILY 100 each 12   lactobacillus acidophilus (BACID) TABS tablet Take 2 tablets by mouth 3 (three) times daily.     Lancets (  FREESTYLE) lancets Check blood sugars no more than twice daily 100 each 12   levothyroxine  (SYNTHROID ) 125 MCG tablet TAKE 1 TABLET BY MOUTH BEFORE BREAKFAST 90 tablet 3   losartan  (COZAAR ) 25 MG tablet Take 1 tablet (25 mg total) by mouth daily. 90 tablet 3   Magnesium 400 MG CAPS Take by mouth.     metFORMIN  (GLUCOPHAGE ) 500 MG tablet Take 1 tablet (500 mg total) by mouth daily. 90 tablet 3   methocarbamol  (ROBAXIN ) 500 MG tablet Take 1 tablet (500 mg total) by mouth every 8 (eight) hours as needed for muscle spasms. 30 tablet 0   metoprolol  succinate (TOPROL -XL) 50 MG 24 hr tablet TAKE 1 TABLET(50 MG) BY MOUTH DAILY WITH OR IMMEDIATELY FOLLOWING A MEAL 90 tablet 1   montelukast  (SINGULAIR ) 10 MG tablet Take 1 tablet (10 mg total) by mouth at bedtime. 90 tablet 3   ondansetron  (ZOFRAN -ODT) 8 MG disintegrating tablet Take 8 mg by mouth every 8 (eight) hours as needed for nausea or vomiting.     Oxycodone  HCl 10 MG TABS Take 1-1.5 tablets (10-15 mg total) by mouth every 8 (eight) hours as needed. 60 tablet 0   Polyethylene Glycol 3350  (MIRALAX PO) Take by mouth.     Rimegepant Sulfate (NURTEC) 75 MG TBDP Take 1 tablet by mouth as needed (pain).     Semaglutide , 1 MG/DOSE, 4 MG/3ML SOPN Inject 1 mg into the skin once a week. 9 mL 3   sodium chloride  0.9 % SOLN 100 mL with Eptinezumab-jjmr 100 MG/ML SOLN 100 mg Inject 100 mg into the vein every 3 (three) months. 100 mg 5   traZODone  (DESYREL ) 100 MG tablet TAKE 2 AND 1/2 TABLETS(250 MG) BY MOUTH AT BEDTIME 225 tablet 1   zolpidem   (AMBIEN ) 5 MG tablet TAKE 1 TABLET(5 MG) BY MOUTH AT BEDTIME AS NEEDED FOR SLEEP 90 tablet 1   No current facility-administered medications for this visit.    Allergies as of 12/29/2023 - Review Complete 12/29/2023  Allergen Reaction Noted   Sulfa antibiotics Hives 09/05/2011   Statins Other (See Comments) 11/09/2017    Vitals: BP 106/60   Pulse 72   Ht 5\' 8"  (1.727 m)   Wt 163 lb (73.9 kg)   BMI 24.78 kg/m  Last Weight:  Wt Readings from Last 1 Encounters:  12/29/23 163 lb (73.9 kg)   Last Height:   Ht Readings from Last 1 Encounters:  12/29/23 5\' 8"  (1.727 m)    Physical exam: Exam: Gen: NAD, conversant      CV: No palpitations or chest pain or SOB. VS: Breathing at a normal rate. Not febrile. Eyes: Conjunctivae clear without exudates or hemorrhage  Neuro: Detailed Neurologic Exam  Speech:    Speech is normal; fluent and spontaneous with normal comprehension.  Cognition:    The patient is oriented to person, place, and time;     recent and remote memory intact;     language fluent;     normal attention, concentration, fund of knowledge Cranial Nerves:    The pupils are equal, round, and reactive to light. Visual fields are full Extraocular movements are intact.  The face is symmetric with normal sensation. The palate elevates in the midline. Hearing intact. Voice is normal. Shoulder shrug is normal. The tongue has normal motion without fasciculations.   Coordination: normal  Gait:    No abnormalities noted or reported  Motor Observation:   no involuntary movements noted. Tone:    Appears normal  Posture:    Posture is normal. normal erect    Strength:    Strength is anti-gravity and symmetric in the upper and lower limbs.      Sensation: intact to LT, no reports of numbness or tingling or paresthesias            Assessment/Plan: Debbie Hayes patient came in today to tell us  she is doing better and happy.   Patient with chronic migraines failed  multiple medications over the last 6 months > 10 total moderate to severe migraines with 2-25 total montly headaches.  50% improvement she went from 31 a month to 9 a month. She takes Nurtec when she get the migraines and doing great on qulipta .  History: failed Emgality /Ajovy /Aimovig  and botox for migraines 5 injections, never tried vyepti(tried qulipta  slight benefir will give samples to brige), qulipta  have tried will give her samples and stop when vyepti approved referral to [facility] center we sent referral 10/2018 she states she never went Start Vyepti for preventative  Topiramate  - had worked, not working anymore, cannot tolerate increase, will titrate off when on Ajovy  Ubrelvy  for acute management didn't help and given nurtec helps but not always NO medication overuse, no aura Has had sleep evaluation/study and is compliant MRI brain normal in the past Continue to follow for for sleep Skelaxone for neck pain didn't help Takes nurtec prn continue  Migraine preventative meds tried and failed > 3 months: Botox 5 injections (also tried this in the past), Emgality /Ajovy /Aimovig , Did not tolerate Topiramate  IR and ER lost effectiveness, metoprolol , atenolol, depakote, effexor, and multiple other first, second and third line preventatives and acute medications over the many years of migraines, imitrex , maxalt  and other triptans, ubrelvy , nurtec, [facility] and other physicians/neurologists. Tried depakote, zonegran.  No orders of the defined types were placed in this encounter.   Cc: Copland, Skipper Dumas, MD,     Aldona Amel, MD  [facility] [address] [place], [place] [address]  Phone [phone] Fax [phone]  I spent 10 minutes of face-to-face and non-face-to-face time with patient on the  1. Intractable chronic migraine without aura and with status migrainosus    diagnosis.  This included  previsit chart review, lab review, study review, order entry, electronic health record documentation, patient education on the different diagnostic and therapeutic options, counseling and coordination of care, risks and benefits of management, compliance, or risk factor reduction

## 2024-01-01 ENCOUNTER — Ambulatory Visit (INDEPENDENT_AMBULATORY_CARE_PROVIDER_SITE_OTHER): Admitting: Sports Medicine

## 2024-01-01 ENCOUNTER — Encounter: Payer: Self-pay | Admitting: Sports Medicine

## 2024-01-01 DIAGNOSIS — M1712 Unilateral primary osteoarthritis, left knee: Secondary | ICD-10-CM | POA: Diagnosis not present

## 2024-01-01 NOTE — Assessment & Plan Note (Signed)
 This very pleasant 73 year old female returns, she has bilateral knee osteoarthritis, she is status post right knee arthroplasty, her left knee was last injected approximately 9 days ago. She did well initially, did some work around the house and now has recurrence of pain. She was unable to get into see me and went to [facility], she was seen by PA and told that nothing could be done. The pain is severe and at the medial joint line somewhat more anteriorly, there is some loss of motion and inability to flex past 90 degrees, she really does not have much tenderness at the pes anserine bursa. At this point I do suspect were dealing with some meniscal tearing versus a subchondral insufficiency fracture. Proceed with MRI, she will go nonweightbearing with crutches which she has at home, she has a knee brace at home, she will continue her oral pain management regimen prescribed by an outside provider. She can also use cryotherapy. What we do next will depend on what I see on the MRI.

## 2024-01-01 NOTE — Progress Notes (Signed)
    Procedures performed today:    None.  Independent interpretation of notes and tests performed by another provider:   None.  Brief History, Exam, Impression, and Recommendations:    Primary osteoarthritis of left knee This very pleasant 73 year old female returns, she has bilateral knee osteoarthritis, she is status post right knee arthroplasty, her left knee was last injected approximately 9 days ago. She did well initially, did some work around the house and now has recurrence of pain. She was unable to get into see me and went to [facility], she was seen by PA and told that nothing could be done. The pain is severe and at the medial joint line somewhat more anteriorly, there is some loss of motion and inability to flex past 90 degrees, she really does not have much tenderness at the pes anserine bursa. At this point I do suspect were dealing with some meniscal tearing versus a subchondral insufficiency fracture. Proceed with MRI, she will go nonweightbearing with crutches which she has at home, she has a knee brace at home, she will continue her oral pain management regimen prescribed by an outside provider. She can also use cryotherapy. What we do next will depend on what I see on the MRI.    ____________________________________________ Joselyn Nicely. Sandy Crumb, M.D., ABFM., CAQSM., AME. Primary Care and Sports Medicine [facility]  Adjunct Professor of [facility] Medicine  [employer]  Restaurant manager, fast food

## 2024-01-01 NOTE — Addendum Note (Signed)
 Addended by: Jeshua Ransford B on: 01/01/2024 04:09 PM   Modules accepted: Level of Service

## 2024-01-03 ENCOUNTER — Other Ambulatory Visit: Payer: Self-pay | Admitting: Family Medicine

## 2024-01-03 NOTE — Progress Notes (Unsigned)
 [facility] [address] [place], [place] [address] [phone]  Date:  01/04/2024   Name:  Debbie Hayes   DOB:  04/12/1951   MRN:  [id number]  PCP:  Kaylee Partridge, MD    Chief Complaint: No chief complaint on file.   History of Present Illness:  Debbie Hayes is a 73 y.o. very pleasant female patient who presents with the following:  Pt seen today with concern of URI symptoms Last seen by myself about 4 weeks ago for diabetes recheck  She has been ill for about 7 days- she first noted congestion and runny nose She can now hear rattling in her chest She has been coughing at night which can make it hard to sleep  Took some tessalon  perles which seems to work pretty well for her No fever, no chills The cough is generally dry  No GI symptoms  Husband Myrtie Atkinson sick recently as well   Lab Results  Component Value Date   HGBA1C 7.2 (H) 12/10/2023   Prefers to avoid augmentin  if possible due to GI SE   Patient Active Problem List   Diagnosis Date Noted   Precordial pain 12/23/2023   Primary osteoarthritis of left knee 09/23/2023   History of arthroplasty of right knee 07/03/2023   Primary osteoarthritis of right shoulder 07/03/2023   Coronary artery disease involving native coronary artery of native heart without angina pectoris 05/25/2023   Agatston coronary artery calcium  score greater than 400 11/06/2022   Statin myopathy 11/06/2022   Frequent UTI 10/29/2022   Wart of hand 09/30/2022   Acute cystitis without hematuria 05/14/2021   Medial epicondylitis, right 03/25/2021   Trigger thumb, right thumb 11/19/2020   Right wrist pain 10/23/2020   Osteoporosis 04/05/2020   Trigger finger, left middle finger 07/27/2019   Chronic neck pain 11/10/2018   Orthopedic hardware present 02/17/2018   Controlled diabetes mellitus type 2 with complications (HCC) 12/15/2017   Intractable chronic migraine  without aura and with status migrainosus 12/15/2017   Arthralgia 12/15/2017   Joint pain 12/15/2017   Migraine, transformed 12/15/2017   Chronic migraine without aura, with intractable migraine, so stated, with status migrainosus 12/15/2017   Pain of left shoulder joint on movement 10/19/2017   OSA (obstructive sleep apnea) 06/26/2017   Snoring 03/25/2016   Palpitations 03/25/2016   Headache 03/25/2016   Sensory disorder of trigeminal nerve 11/26/2015   Spondylolisthesis of lumbar region 08/09/2014   Lumbar adjacent segment disease with spondylolisthesis 08/09/2014   Back pain 02/17/2014   Macular degeneration 02/17/2014   Backache 02/17/2014   Other and unspecified hyperlipidemia 10/14/2013   Hyperlipidemia 10/14/2013   Onychomycosis 12/01/2012   Arthritis 04/12/2012   Hypothyroidism 04/12/2012    Past Medical History:  Diagnosis Date   Allergy    Asthma    Cancer (HCC)    thyroid  cancer   Cataract    Chronic migraine BOTOX INJECTION EVERY 3 MONTHS   Chronic pain in right shoulder    Constipation    Diabetes mellitus    Disorder of inner ear CAUSES VERTIGO OCCASIONALLY   Fibromyalgia    GERD (gastroesophageal reflux disease)    Hemorrhoid    History of bladder infections    History of thyroid  cancer 2009  S/P TOTAL THYROIDECTOMY AND RADIATION   Hyperlipidemia    Hypertension CARDIOLOGIST- DR Anastasia Balo- WILL REQUEST LATE NOTE   DENIES S & S   Hypothyroidism  Insomnia    Left shoulder pain    Macular degeneration    MRSA infection    nasal treated more than 15 years ago   Normal nuclear stress test 01/25/2009   OA (osteoarthritis) JOINTS   Osteopenia    Osteoporosis    Painful orthopaedic hardware (HCC)    left foot   PONV (postoperative nausea and vomiting) SEVERE   Rotator cuff disorder LEFT SHOULDER RTC IMPINGMENT   Sleep apnea    mouth guard, no cpap    Sleep apnea in adult 06/26/2017   Spondylolisthesis of lumbar region    TMJ syndrome WEARS APPLIANCE  AT NIGHT   Wears glasses     Past Surgical History:  Procedure Laterality Date   BACK SURGERY     Dr Larrie Po- spondylosis   BILATERAL CARPAL TUNNEL RELEASE  1994   BILATERAL ELBOW SURG.  1999   BILATERAL SALPINGOOPHECTOMY  1993   POST-OP URETER REPAIR 12 DAYS AFTER    CATARACT EXTRACTION, BILATERAL     COLONOSCOPY     EYE SURGERY     FOOT ARTHRODESIS Left 07/10/2016   Procedure: LEFT 2ND TARSAL METATARSAL ARTHRODESIS  GASTROC RECESSION LEFT LAPIDUS MODIFIED MCBRIDE BUIONECTOMY;  Surgeon: Amada Backer, MD;  Location: [facility];  Service: Orthopedics;  Laterality: Left;   GASTROC RECESSION EXTREMITY Left 07/10/2016   Procedure: GASTROC RECESSION EXTREMITY;  Surgeon: Amada Backer, MD;  Location: [facility];  Service: Orthopedics;  Laterality: Left;   HARDWARE REMOVAL Left 05/20/2018   Procedure: Left foot removal of deep implants;  Surgeon: Amada Backer, MD;  Location:  SURGERY CENTER;  Service: Orthopedics;  Laterality: Left;    LEFT THUMB JOINT REPLACEMENT  2005   RIGHT DONE IN 2004   OOPHORECTOMY     POLYPECTOMY     RIGHT KNEE ARTHROSCOPY  X2  BEFORE 2011   RIGHT KNEE ARTHROSCOPY/ PARTIAL LATERAL MENISECTOMY/ TRICOMPARTMENT CHONDROPLASTY/ DECOMPRESSION CYST  10/26/2009   RIGHT KNEE CLOSED MANIPULATION  09/11/2010   RIGHT SHOULDER ARTHROSCOPY  2010  &  2004   TIBIA CYST REMOVED AND ORIF LEG FX  1958   TONSILLECTOMY  1968   TOTAL KNEE ARTHROPLASTY  07/16/2010   RIGHT   TOTAL THYROIDECTOMY  2009   CANCER  (POST-OP BLEED)  AND RADIATION TX   trigger finger repair      07/2023   trigger fingers Right 2013   3rd and 4th fingers   UPPER GASTROINTESTINAL ENDOSCOPY     VAGINAL HYSTERECTOMY  1990    Social History   Tobacco Use   Smoking status: Never   Smokeless tobacco: Never  Vaping Use   Vaping status: Never Used  Substance Use Topics   Alcohol use: Yes    Comment: RARE   Drug use: No    Family History  Problem Relation  Age of Onset   Alzheimer's disease Mother    Migraines Mother    Heart disease Father    Cirrhosis Father    Heart attack Father    Macular degeneration Father    Migraines Sister    Migraines Maternal Grandmother    Migraines Niece    Colon cancer Neg Hx    Esophageal cancer Neg Hx    Rectal cancer Neg Hx    Stomach cancer Neg Hx    Colon polyps Neg Hx     Allergies  Allergen Reactions   Sulfa Antibiotics Hives   Statins Other (See Comments)    Pt has tried  multiple and cannot tolerate    Medication list has been reviewed and updated.  Current Outpatient Medications on File Prior to Visit  Medication Sig Dispense Refill   acetaminophen  (TYLENOL ) 500 MG tablet Take 500 mg by mouth every 6 (six) hours as needed.     Albuterol  Sulfate (PROAIR  RESPICLICK) 108 (90 Base) MCG/ACT AEPB Inhale 2 puffs every 4-6 hours as needed for wheezing or shortness of breath 1 each 12   alendronate  (FOSAMAX ) 70 MG tablet TAKE 1 TABLET(70 MG) BY MOUTH EVERY 7 DAYS WITH A FULL GLASS OF WATER AND ON AN EMPTY STOMACH 12 tablet 1   Atogepant  (QULIPTA ) 60 MG TABS Take 1 tablet (60 mg total) by mouth daily. 90 tablet 3   blood glucose meter kit and supplies KIT Test blood sugar once daily. Dx code: E11.9 1 each 0   Blood Glucose Monitoring Suppl (FREESTYLE LITE) DEVI Check blood sugar no more than twice daily 1 each 0   Blood Glucose Monitoring Suppl (ONE TOUCH ULTRA 2) w/Device KIT Dispense one meter and test strips, lancets- 100 of each with PRN RF 1 kit 0   clopidogrel  (PLAVIX ) 75 MG tablet Take 1 tablet (75 mg total) by mouth daily. 90 tablet 3   Cranberry 125 MG TABS Take by mouth.     dicyclomine  (BENTYL ) 20 MG tablet Take 1 tablet (20 mg total) by mouth 4 (four) times daily as needed for spasms. 60 tablet 1   estradiol  (ESTRACE ) 0.1 MG/GM vaginal cream Place 1 Applicatorful vaginally 3 (three) times a week. 42.5 g 12   Evolocumab  (REPATHA  SURECLICK) 140 MG/ML SOAJ Inject 140 mg into the skin every  14 (fourteen) days. 6 mL 0   famotidine  (PEPCID ) 40 MG tablet Take 40 mg by mouth daily.     Fexofenadine HCl (ALLEGRA PO) Take by mouth.     fluticasone  (FLOVENT  HFA) 110 MCG/ACT inhaler Inhale 1-2 puffs into the lungs 2 (two) times daily. 1 each 12   glucose blood (FREESTYLE LITE) test strip CHECK BLOOD SUGARS NO MORE THAN TWICE DAILY 100 each 12   lactobacillus acidophilus (BACID) TABS tablet Take 2 tablets by mouth 3 (three) times daily.     Lancets (FREESTYLE) lancets Check blood sugars no more than twice daily 100 each 12   levothyroxine  (SYNTHROID ) 125 MCG tablet TAKE 1 TABLET BY MOUTH BEFORE BREAKFAST 90 tablet 3   losartan  (COZAAR ) 25 MG tablet Take 1 tablet (25 mg total) by mouth daily. 90 tablet 3   Magnesium 400 MG CAPS Take by mouth.     metFORMIN  (GLUCOPHAGE ) 500 MG tablet Take 1 tablet (500 mg total) by mouth daily. 90 tablet 3   methocarbamol  (ROBAXIN ) 500 MG tablet TAKE 1 TABLET(500 MG) BY MOUTH EVERY 8 HOURS AS NEEDED FOR MUSCLE SPASMS 30 tablet 0   metoprolol  succinate (TOPROL -XL) 50 MG 24 hr tablet TAKE 1 TABLET(50 MG) BY MOUTH DAILY WITH OR IMMEDIATELY FOLLOWING A MEAL 90 tablet 1   montelukast  (SINGULAIR ) 10 MG tablet Take 1 tablet (10 mg total) by mouth at bedtime. 90 tablet 3   ondansetron  (ZOFRAN -ODT) 8 MG disintegrating tablet Take 8 mg by mouth every 8 (eight) hours as needed for nausea or vomiting.     Oxycodone  HCl 10 MG TABS Take 1-1.5 tablets (10-15 mg total) by mouth every 8 (eight) hours as needed. 60 tablet 0   Polyethylene Glycol 3350  (MIRALAX PO) Take by mouth.     Rimegepant Sulfate (NURTEC) 75 MG TBDP Take 1 tablet by  mouth as needed (pain).     Semaglutide , 1 MG/DOSE, 4 MG/3ML SOPN Inject 1 mg into the skin once a week. 9 mL 3   sodium chloride  0.9 % SOLN 100 mL with Eptinezumab-jjmr 100 MG/ML SOLN 100 mg Inject 100 mg into the vein every 3 (three) months. 100 mg 5   traZODone  (DESYREL ) 100 MG tablet TAKE 2 AND 1/2 TABLETS(250 MG) BY MOUTH AT BEDTIME 225  tablet 1   zolpidem  (AMBIEN ) 5 MG tablet TAKE 1 TABLET(5 MG) BY MOUTH AT BEDTIME AS NEEDED FOR SLEEP 90 tablet 1   No current facility-administered medications on file prior to visit.    Review of Systems:  As per HPI- otherwise negative.   Physical Examination: Vitals:   01/04/24 1352  BP: 106/65  Pulse: 87  Temp: 98.4 F (36.9 C)  SpO2: 95%   Vitals:   01/04/24 1352  Weight: 161 lb 9.6 oz (73.3 kg)  Height: 5\' 8"  (1.727 m)   Body mass index is 24.57 kg/m. Ideal Body Weight: Weight in (lb) to have BMI = 25: 164.1  GEN: no acute distress.  Normal weight. In [facility] today due to knee injury  HEENT: Atraumatic, Normocephalic.  Bilateral TM wnl, oropharynx normal.  PEERL,EOMI.   Ears and Nose: No external deformity. CV: RRR, No M/G/R. No JVD. No thrill. No extra heart sounds. PULM: bilateral rhonchi are ausculted . No retractions. No resp. distress. No accessory muscle use. ABD: S, NT, ND, +BS. No rebound. No HSM. EXTR: No c/c/e PSYCH: Normally interactive. Conversant.    Assessment and Plan: Acute cough - Plan: DG Chest 2 View, azithromycin  (ZITHROMAX ) 250 MG tablet  Pt seen today with concern of URI and worsening cough Her husband and daughter have recently been ill with same  Obtain CXR  Signed Gates Kasal, MD  Received her chest film, gave her a call Will treat with azithromycin  - she prefers this to doxy due to GI symptoms Asked her to alert me if not improving in the next couple of days- Sooner if worse.     DG Chest 2 View Result Date: 01/04/2024 CLINICAL DATA:  Cough. EXAM: CHEST - 2 VIEW COMPARISON:  05/18/2021. FINDINGS: The heart size and mediastinal contours are within normal limits. Aortic atherosclerosis. Chronic appearing bronchitic changes. No focal consolidation, pleural effusion, or pneumothorax. Degenerative changes of the thoracic spine and bilateral glenohumeral joints. IMPRESSION: Chronic appearing bronchitic changes.  No focal consolidation.  Electronically Signed   By: Mannie Seek M.D.   On: 01/04/2024 15:40

## 2024-01-04 ENCOUNTER — Other Ambulatory Visit (HOSPITAL_COMMUNITY): Payer: Self-pay

## 2024-01-04 ENCOUNTER — Telehealth: Payer: Self-pay

## 2024-01-04 ENCOUNTER — Encounter: Payer: Self-pay | Admitting: Family Medicine

## 2024-01-04 ENCOUNTER — Ambulatory Visit (HOSPITAL_BASED_OUTPATIENT_CLINIC_OR_DEPARTMENT_OTHER)
Admission: RE | Admit: 2024-01-04 | Discharge: 2024-01-04 | Disposition: A | Discharge status: Home / Self Care | Source: Ambulatory Visit | Attending: Family Medicine | Admitting: Family Medicine

## 2024-01-04 ENCOUNTER — Ambulatory Visit (INDEPENDENT_AMBULATORY_CARE_PROVIDER_SITE_OTHER): Admitting: Family Medicine

## 2024-01-04 ENCOUNTER — Telehealth: Payer: Self-pay | Admitting: Internal Medicine

## 2024-01-04 VITALS — BP 106/65 | HR 87 | Temp 98.4°F | Ht 68.0 in | Wt 161.6 lb

## 2024-01-04 DIAGNOSIS — R051 Acute cough: Secondary | ICD-10-CM

## 2024-01-04 MED ORDER — AZITHROMYCIN 250 MG PO TABS: ORAL_TABLET | ORAL | 0 refills | Status: AC

## 2024-01-04 NOTE — Telephone Encounter (Signed)
 Copied from CRM [id number]. Topic: Referral - Prior Authorization Question >> Jan 04, 2024 12:33 PM Debbie Hayes wrote: Reason for CRM: [employer] Advise PA for Nurtec 75 has been approved- will be faxing approval to office.

## 2024-01-04 NOTE — Telephone Encounter (Signed)
 Good afternoon Dr. Bridgett Camps this patient called and stated that she is needing to reschedule her appointment for June the 13 th due to her being pretty sick. Patient was reschedule for August the 12 th at 1:00 PM. Please advise.

## 2024-01-04 NOTE — Telephone Encounter (Signed)
 Pharmacy Patient Advocate Encounter   Received notification from Patient Pharmacy that prior authorization for Nurtec 75 is required/requested.   Insurance verification completed.   The patient is insured through [facility] .   Per test claim: PA required; PA submitted to above mentioned insurance via CoverMyMeds Key/confirmation #/EOC [id number] Status is pending

## 2024-01-06 ENCOUNTER — Encounter: Payer: Self-pay | Admitting: Sports Medicine

## 2024-01-06 ENCOUNTER — Other Ambulatory Visit (HOSPITAL_COMMUNITY): Payer: Self-pay

## 2024-01-07 ENCOUNTER — Ambulatory Visit: Payer: Medicare Other | Admitting: Neurology

## 2024-01-07 ENCOUNTER — Encounter: Payer: Self-pay | Admitting: Family Medicine

## 2024-01-07 DIAGNOSIS — R051 Acute cough: Secondary | ICD-10-CM

## 2024-01-07 MED ORDER — PREDNISONE 20 MG PO TABS: ORAL_TABLET | ORAL | 0 refills | Status: DC

## 2024-01-07 MED ORDER — LEVOFLOXACIN 750 MG PO TABS: 750.0000 mg | ORAL_TABLET | Freq: Every day | ORAL | 0 refills | Status: DC

## 2024-01-07 NOTE — Telephone Encounter (Signed)
 Gave Debbie Hayes a call back- I saw her on Monday and gave her a zpack for cough/ illness for 7 days Chest x-ray showed chronic bronchitis changes.  However she is not really feeling better at all and will finish zpack tomorrow  Lab Results  Component Value Date   HGBA1C 7.2 (H) 12/10/2023   Covid negative No fever Not feeling SOB  We decided to add prednisone - she will monitor her glucose  I also gave her an rx for Levaquin that she can start if the prednisone  does not get her feeling better. She would like to be seen for a recheck- scheduled her with my partner Dr Neomi Banks for tomorrow.  She will cancel the appt if improved

## 2024-01-07 NOTE — Telephone Encounter (Signed)
 They requested additional notes, which have been submitted,will wait on response.

## 2024-01-08 ENCOUNTER — Encounter: Payer: Self-pay | Admitting: Family Medicine

## 2024-01-08 ENCOUNTER — Encounter: Admitting: Internal Medicine

## 2024-01-08 ENCOUNTER — Ambulatory Visit: Admitting: Internal Medicine

## 2024-01-08 ENCOUNTER — Telehealth: Payer: Self-pay

## 2024-01-08 NOTE — Telephone Encounter (Signed)
 She cancelled the appointment.  Happy to see her anytime

## 2024-01-08 NOTE — Telephone Encounter (Signed)
 Approved:  #[id number] Expiration:  01/01/24-01/30/24

## 2024-01-08 NOTE — Telephone Encounter (Signed)
 Debbie Hayes with [employer]  [phone] has called  States patient MRI of knee expires tomorrow and is needing a peer to peer review Through [facility]  Phone # [phone] Fax# [phone]  Called [facility] and spoke with prepresentative Mariah Shines who will assist Dr. Sandy Crumb in starting Peer to peer  Transferred call to Dr. Sandy Crumb to complete

## 2024-01-08 NOTE — Telephone Encounter (Signed)
 Forwarded approval information to [employer] Patient informed of approval and given phone number to radiology at [facility] med center to let them know of approval and possible to assist with scheduling.

## 2024-01-08 NOTE — Telephone Encounter (Signed)
Pt cancelled appt for today

## 2024-01-09 ENCOUNTER — Ambulatory Visit

## 2024-01-09 DIAGNOSIS — M1712 Unilateral primary osteoarthritis, left knee: Secondary | ICD-10-CM

## 2024-01-09 DIAGNOSIS — M25562 Pain in left knee: Secondary | ICD-10-CM | POA: Diagnosis not present

## 2024-01-10 ENCOUNTER — Encounter: Payer: Self-pay | Admitting: Family Medicine

## 2024-01-10 ENCOUNTER — Other Ambulatory Visit: Payer: Self-pay | Admitting: Family Medicine

## 2024-01-11 ENCOUNTER — Other Ambulatory Visit: Payer: Self-pay | Admitting: Family Medicine

## 2024-01-11 ENCOUNTER — Ambulatory Visit: Payer: Self-pay | Admitting: Sports Medicine

## 2024-01-11 ENCOUNTER — Encounter: Payer: Self-pay | Admitting: Sports Medicine

## 2024-01-11 DIAGNOSIS — G8929 Other chronic pain: Secondary | ICD-10-CM

## 2024-01-11 DIAGNOSIS — M1712 Unilateral primary osteoarthritis, left knee: Secondary | ICD-10-CM

## 2024-01-11 LAB — HM DIABETES EYE EXAM

## 2024-01-11 MED ORDER — OXYCODONE HCL 10 MG PO TABS: 10.0000 mg | ORAL_TABLET | Freq: Three times a day (TID) | ORAL | 0 refills | Status: DC | PRN

## 2024-01-11 NOTE — Telephone Encounter (Signed)
 Thank you :)

## 2024-01-12 ENCOUNTER — Telehealth: Payer: Self-pay

## 2024-01-12 NOTE — Telephone Encounter (Signed)
 Patient has never been seen at this office, Patient goes to [id number]      Copied from CRM [id number]. Topic: General - Other >> Jan 01, 2024 12:08 PM Tisa Forester wrote: Reason for CRM: Margo Shells   of [facility] request to speak to  [facility] regarding Mri left knee please call back  call [phone]

## 2024-01-17 ENCOUNTER — Encounter: Payer: Self-pay | Admitting: Neurology

## 2024-01-20 ENCOUNTER — Ambulatory Visit

## 2024-01-20 ENCOUNTER — Other Ambulatory Visit: Payer: Self-pay | Admitting: Family Medicine

## 2024-01-20 VITALS — BP 106/65 | Ht 68.0 in | Wt 156.0 lb

## 2024-01-20 DIAGNOSIS — Z0000 Encounter for general adult medical examination without abnormal findings: Secondary | ICD-10-CM

## 2024-01-20 DIAGNOSIS — Z2821 Immunization not carried out because of patient refusal: Secondary | ICD-10-CM

## 2024-01-20 DIAGNOSIS — Z5320 Procedure and treatment not carried out because of patient's decision for unspecified reasons: Secondary | ICD-10-CM

## 2024-01-20 NOTE — Patient Instructions (Signed)
 [name] , Thank you for taking time out of your busy schedule to complete your Annual Wellness Visit with me. I enjoyed our conversation and look forward to speaking with you again next year. I, as well as your care team,  appreciate your ongoing commitment to your health goals. Please review the following plan we discussed and let me know if I can assist you in the future. Your Game plan/ To Do List    Referrals: If you haven't heard from the office you've been referred to, please reach out to them at the phone provided.  none Follow up Visits: Next Medicare AWV with our clinical staff: 01/25/2025   Have you seen your provider in the last 6 months (3 months if uncontrolled diabetes)? Yes Next Office Visit with your provider: n/a  Clinician Recommendations:  Aim for 30 minutes of exercise or brisk walking, 6-8 glasses of water, and 5 servings of fruits and vegetables each day.       This is a list of the screening recommended for you and due dates:  Health Maintenance  Topic Date Due   Zoster (Shingles) Vaccine (1 of 2) 07/11/1999   Colon Cancer Screening  12/05/2022   DTaP/Tdap/Td vaccine (2 - Td or Tdap) 03/11/2023   Yearly kidney health urinalysis for diabetes  08/08/2023   COVID-19 Vaccine (5 - 2024-25 season) 01/07/2025*   Flu Shot  02/26/2024   Complete foot exam   03/22/2024   Hemoglobin A1C  06/11/2024   Yearly kidney function blood test for diabetes  12/09/2024   Eye exam for diabetics  01/10/2025   Medicare Annual Wellness Visit  01/19/2025   Mammogram  11/22/2025   Pneumococcal Vaccine for age over 36  Completed   DEXA scan (bone density measurement)  Completed   Hepatitis C Screening  Completed   Hepatitis B Vaccine  Aged Out   HPV Vaccine  Aged Out   Meningitis B Vaccine  Aged Out  *Topic was postponed. The date shown is not the original due date.    Advanced directives: (Copy Requested) Please bring a copy of your health care power of attorney and living will to  the office to be added to your chart at your convenience. You can mail to [facility] [address] [place], [place] [address] or email to [email or web address] Advance Care Planning is important because it:  [x]  Makes sure you receive the medical care that is consistent with your values, goals, and preferences  [x]  It provides guidance to your family and loved ones and reduces their decisional burden about whether or not they are making the right decisions based on your wishes.  Follow the link provided in your after visit summary or read over the paperwork we have mailed to you to help you started getting your Advance Directives in place. If you need assistance in completing these, please reach out to us  so that we can help you!  See attachments for Preventive Care and Fall Prevention Tips.

## 2024-01-20 NOTE — Progress Notes (Addendum)
 Because this visit was a virtual/telehealth visit,  certain criteria was not obtained, such a blood pressure, CBG if applicable, and timed get up and go. Any medications not marked as taking were not mentioned during the medication reconciliation part of the visit. Any vitals not documented were not able to be obtained due to this being a telehealth visit or patient was unable to self-report a recent blood pressure reading due to a lack of equipment at home via telehealth. Vitals that have been documented are verbally provided by the patient.  This visit was performed by a medical professional under my direct supervision. I was immediately available for consultation/collaboration. I have reviewed and agree with the Annual Wellness Visit documentation.   Subjective:   Debbie Hayes is a 73 y.o. who presents for a Medicare Wellness preventive visit.  As a reminder, Annual Wellness Visits don't include a physical exam, and some assessments may be limited, especially if this visit is performed virtually. We may recommend an in-person follow-up visit with your provider if needed.  Visit Complete: Virtual I connected with  Debbie Hayes on 01/20/24 by a audio enabled telemedicine application and verified that I am speaking with the correct person using two identifiers.  Patient Location: Home  Provider Location: Home Office  I discussed the limitations of evaluation and management by telemedicine. The patient expressed understanding and agreed to proceed.  Vital Signs: Because this visit was a virtual/telehealth visit, some criteria may be missing or patient reported. Any vitals not documented were not able to be obtained and vitals that have been documented are patient reported.  VideoDeclined- This patient declined Librarian, academic. Therefore the visit was completed with audio only.  Persons Participating in Visit: Patient.  AWV Questionnaire: No:  Patient Medicare AWV questionnaire was not completed prior to this visit.  Cardiac Risk Factors include: advanced age (>68men, >83 women);diabetes mellitus     Objective:    Today's Vitals   01/20/24 1143 01/20/24 1144  BP: 106/65   Weight: 156 lb (70.8 kg)   Height: 5' 8 (1.727 m)   PainSc:  10-Worst pain ever   Body mass index is 23.72 kg/m.     01/20/2024   11:37 AM 01/19/2023   10:59 AM 01/15/2022   11:05 AM 04/28/2019   11:22 AM 05/20/2018   10:05 AM 05/12/2018    3:59 PM 05/10/2018    2:34 PM  Advanced Directives  Does Patient Have a Medical Advance Directive? Yes Yes Yes Yes Yes  No  No   Type of Estate agent of [place];Living will Healthcare Power of [place];Living will Healthcare Power of [place];Out of facility DNR (pink MOST or yellow form);Living will Healthcare Power of [place];Living will Healthcare Power of [place];Living will    Does patient want to make changes to medical advance directive? No - Patient declined  No - Patient declined  No - Patient declined     Copy of Healthcare Power of Attorney in Chart? No - copy requested No - copy requested No - copy requested No - copy requested No - copy requested     Would patient like information on creating a medical advance directive?      No - Patient declined  No - Patient declined      Data saved with a previous flowsheet row definition    Current Medications (verified) Outpatient Encounter Medications as of 01/20/2024  Medication Sig   acetaminophen  (TYLENOL ) 500 MG tablet Take 500 mg  by mouth every 6 (six) hours as needed.   Albuterol  Sulfate (PROAIR  RESPICLICK) 108 (90 Base) MCG/ACT AEPB Inhale 2 puffs every 4-6 hours as needed for wheezing or shortness of breath   alendronate  (FOSAMAX ) 70 MG tablet TAKE 1 TABLET(70 MG) BY MOUTH EVERY 7 DAYS WITH A FULL GLASS OF WATER AND ON AN EMPTY STOMACH   Atogepant  (QULIPTA ) 60 MG TABS Take 1 tablet (60 mg total) by mouth daily.   blood glucose  meter kit and supplies KIT Test blood sugar once daily. Dx code: E11.9   Blood Glucose Monitoring Suppl (FREESTYLE LITE) DEVI Check blood sugar no more than twice daily   Blood Glucose Monitoring Suppl (ONE TOUCH ULTRA 2) w/Device KIT Dispense one meter and test strips, lancets- 100 of each with PRN RF   clopidogrel  (PLAVIX ) 75 MG tablet Take 1 tablet (75 mg total) by mouth daily.   Cranberry 125 MG TABS Take by mouth.   dicyclomine  (BENTYL ) 20 MG tablet Take 1 tablet (20 mg total) by mouth 4 (four) times daily as needed for spasms.   estradiol  (ESTRACE ) 0.1 MG/GM vaginal cream Place 1 Applicatorful vaginally 3 (three) times a week.   Evolocumab  (REPATHA  SURECLICK) 140 MG/ML SOAJ Inject 140 mg into the skin every 14 (fourteen) days.   famotidine  (PEPCID ) 40 MG tablet Take 40 mg by mouth daily.   Fexofenadine HCl (ALLEGRA PO) Take by mouth.   fluticasone  (FLOVENT  HFA) 110 MCG/ACT inhaler Inhale 1-2 puffs into the lungs 2 (two) times daily.   glucose blood (FREESTYLE LITE) test strip CHECK BLOOD SUGARS NO MORE THAN TWICE DAILY   lactobacillus acidophilus (BACID) TABS tablet Take 2 tablets by mouth 3 (three) times daily.   Lancets (FREESTYLE) lancets Check blood sugars no more than twice daily   levofloxacin  (LEVAQUIN ) 750 MG tablet Take 1 tablet (750 mg total) by mouth daily.   levothyroxine  (SYNTHROID ) 125 MCG tablet TAKE 1 TABLET BY MOUTH BEFORE BREAKFAST   losartan  (COZAAR ) 25 MG tablet Take 1 tablet (25 mg total) by mouth daily.   Magnesium 400 MG CAPS Take by mouth.   metFORMIN  (GLUCOPHAGE ) 500 MG tablet Take 1 tablet (500 mg total) by mouth daily.   metoprolol  succinate (TOPROL -XL) 50 MG 24 hr tablet TAKE 1 TABLET(50 MG) BY MOUTH DAILY WITH OR IMMEDIATELY FOLLOWING A MEAL   montelukast  (SINGULAIR ) 10 MG tablet Take 1 tablet (10 mg total) by mouth at bedtime.   ondansetron  (ZOFRAN -ODT) 8 MG disintegrating tablet Take 8 mg by mouth every 8 (eight) hours as needed for nausea or vomiting.    Oxycodone  HCl 10 MG TABS Take 1-1.5 tablets (10-15 mg total) by mouth every 8 (eight) hours as needed.   Polyethylene Glycol 3350  (MIRALAX PO) Take by mouth.   Rimegepant Sulfate (NURTEC) 75 MG TBDP Take 1 tablet by mouth as needed (pain).   Semaglutide , 1 MG/DOSE, 4 MG/3ML SOPN Inject 1 mg into the skin once a week.   traZODone  (DESYREL ) 100 MG tablet TAKE 2 AND 1/2 TABLETS(250 MG) BY MOUTH AT BEDTIME   zolpidem  (AMBIEN ) 5 MG tablet TAKE 1 TABLET(5 MG) BY MOUTH AT BEDTIME AS NEEDED FOR SLEEP   [DISCONTINUED] methocarbamol  (ROBAXIN ) 500 MG tablet TAKE 1 TABLET(500 MG) BY MOUTH EVERY 8 HOURS AS NEEDED FOR MUSCLE SPASMS   predniSONE  (DELTASONE ) 20 MG tablet Take 40 mg daily for 3 days, then 20 mg daily for 3 days if needed.  If feeling better can stop use after 3 days (Patient not taking: Reported on 01/20/2024)  sodium chloride  0.9 % SOLN 100 mL with Eptinezumab-jjmr 100 MG/ML SOLN 100 mg Inject 100 mg into the vein every 3 (three) months. (Patient not taking: Reported on 01/20/2024)   No facility-administered encounter medications on file as of 01/20/2024.    Allergies (verified) Sulfa antibiotics and Statins   History: Past Medical History:  Diagnosis Date   Allergy    Asthma    Cancer (HCC)    thyroid  cancer   Cataract    Chronic migraine BOTOX INJECTION EVERY 3 MONTHS   Chronic pain in right shoulder    Constipation    Diabetes mellitus    Disorder of inner ear CAUSES VERTIGO OCCASIONALLY   Fibromyalgia    GERD (gastroesophageal reflux disease)    Hemorrhoid    History of bladder infections    History of thyroid  cancer 2009  S/P TOTAL THYROIDECTOMY AND RADIATION   Hyperlipidemia    Hypertension CARDIOLOGIST- DR BLANCA- WILL REQUEST LATE NOTE   DENIES S & S   Hypothyroidism    Insomnia    Left shoulder pain    Macular degeneration    MRSA infection    nasal treated more than 15 years ago   Normal nuclear stress test 01/25/2009   OA (osteoarthritis) JOINTS   Osteopenia     Osteoporosis    Painful orthopaedic hardware (HCC)    left foot   PONV (postoperative nausea and vomiting) SEVERE   Rotator cuff disorder LEFT SHOULDER RTC IMPINGMENT   Sleep apnea    mouth guard, no cpap    Sleep apnea in adult 06/26/2017   Spondylolisthesis of lumbar region    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   Wears glasses    Past Surgical History:  Procedure Laterality Date   BACK SURGERY     Dr Mavis- spondylosis   BILATERAL CARPAL TUNNEL RELEASE  1994   BILATERAL ELBOW SURG.  1999   BILATERAL SALPINGOOPHECTOMY  1993   POST-OP URETER REPAIR 12 DAYS AFTER    CATARACT EXTRACTION, BILATERAL     COLONOSCOPY     EYE SURGERY     FOOT ARTHRODESIS Left 07/10/2016   Procedure: LEFT 2ND TARSAL METATARSAL ARTHRODESIS  GASTROC RECESSION LEFT LAPIDUS MODIFIED MCBRIDE BUIONECTOMY;  Surgeon: Norleen Armor, MD;  Location: [facility];  Service: Orthopedics;  Laterality: Left;   GASTROC RECESSION EXTREMITY Left 07/10/2016   Procedure: GASTROC RECESSION EXTREMITY;  Surgeon: Norleen Armor, MD;  Location: [facility];  Service: Orthopedics;  Laterality: Left;   HARDWARE REMOVAL Left 05/20/2018   Procedure: Left foot removal of deep implants;  Surgeon: Armor Norleen, MD;  Location: [facility];  Service: Orthopedics;  Laterality: Left;    LEFT THUMB JOINT REPLACEMENT  2005   RIGHT DONE IN 2004   OOPHORECTOMY     POLYPECTOMY     RIGHT KNEE ARTHROSCOPY  X2  BEFORE 2011   RIGHT KNEE ARTHROSCOPY/ PARTIAL LATERAL MENISECTOMY/ TRICOMPARTMENT CHONDROPLASTY/ DECOMPRESSION CYST  10/26/2009   RIGHT KNEE CLOSED MANIPULATION  09/11/2010   RIGHT SHOULDER ARTHROSCOPY  2010  &  2004   TIBIA CYST REMOVED AND ORIF LEG FX  1958   TONSILLECTOMY  1968   TOTAL KNEE ARTHROPLASTY  07/16/2010   RIGHT   TOTAL THYROIDECTOMY  2009   CANCER  (POST-OP BLEED)  AND RADIATION TX   trigger finger repair      07/2023   trigger fingers Right 2013   3rd and 4th fingers    UPPER GASTROINTESTINAL ENDOSCOPY  VAGINAL HYSTERECTOMY  1990   Family History  Problem Relation Age of Onset   Alzheimer's disease Mother    Migraines Mother    Heart disease Father    Cirrhosis Father    Heart attack Father    Macular degeneration Father    Migraines Sister    Migraines Maternal Grandmother    Migraines Niece    Colon cancer Neg Hx    Esophageal cancer Neg Hx    Rectal cancer Neg Hx    Stomach cancer Neg Hx    Colon polyps Neg Hx    Social History   Socioeconomic History   Marital status: Married    Spouse name: Not on file   Number of children: 4   Years of education: Not on file   Highest education level: Bachelor's degree (e.g., BA, AB, BS)  Occupational History    Comment: retired Charity fundraiser  Tobacco Use   Smoking status: Never   Smokeless tobacco: Never  Vaping Use   Vaping status: Never Used  Substance and Sexual Activity   Alcohol use: Yes    Comment: RARE   Drug use: No   Sexual activity: Not on file  Other Topics Concern   Not on file  Social History Narrative   Lives with husband Dr Alm Hayes.   They have 2 adult children that live out of state.   Homemaker    Social Drivers of Health   Financial Resource Strain: Low Risk  (01/20/2024)   Overall Financial Resource Strain (CARDIA)    Difficulty of Paying Living Expenses: Not hard at all  Food Insecurity: No Food Insecurity (01/20/2024)   Hunger Vital Sign    Worried About Running Out of Food in the Last Year: Never true    Ran Out of Food in the Last Year: Never true  Transportation Needs: No Transportation Needs (01/20/2024)   PRAPARE - Administrator, Civil Service (Medical): No    Lack of Transportation (Non-Medical): No  Physical Activity: Inactive (01/20/2024)   Exercise Vital Sign    Days of Exercise per Week: 0 days    Minutes of Exercise per Session: 0 min  Stress: No Stress Concern Present (01/20/2024)   [employer] of Occupational Health - Occupational  Stress Questionnaire    Feeling of Stress: Only a little  Social Connections: Socially Integrated (01/20/2024)   Social Connection and Isolation Panel    Frequency of Communication with Friends and Family: More than three times a week    Frequency of Social Gatherings with Friends and Family: More than three times a week    Attends Religious Services: More than 4 times per year    Active Member of [employer] or Organizations: Yes    Attends Engineer, structural: More than 4 times per year    Marital Status: Married    Tobacco Counseling Counseling given: Not Answered    Clinical Intake:  Pre-visit preparation completed: Yes  Pain : 0-10 Pain Score: 10-Worst pain ever Pain Type: Chronic pain Pain Location: Knee Pain Orientation: Left Pain Descriptors / Indicators: Sharp, Jabbing, Constant Pain Onset: Today Pain Frequency: Rarely Pain Relieving Factors: taking medications  Pain Relieving Factors: taking medications  BMI - recorded: 23.72 Nutritional Status: BMI of 19-24  Normal Nutritional Risks: None Diabetes: Yes CBG done?: No Did pt. bring in CBG monitor from home?: No  Lab Results  Component Value Date   HGBA1C 7.2 (H) 12/10/2023   HGBA1C 7.2 (H) 09/09/2023  HGBA1C 6.7 (H) 03/23/2023     How often do you need to have someone help you when you read instructions, pamphlets, or other written materials from your doctor or pharmacy?: 1 - Never  Interpreter Needed?: No  Information entered by :: Maribel Hadley,CMA   Activities of Daily Living     01/20/2024   11:49 AM  In your present state of health, do you have any difficulty performing the following activities:  Hearing? 0  Vision? 0  Difficulty concentrating or making decisions? 0  Walking or climbing stairs? 0  Dressing or bathing? 0  Doing errands, shopping? 0  Preparing Food and eating ? N  Using the Toilet? N  In the past six months, have you accidently leaked urine? Y  Do you have problems  with loss of bowel control? N  Managing your Medications? N  Managing your Finances? N  Housekeeping or managing your Housekeeping? N    Patient Care Team: Copland, Harlene BROCKS, MD as PCP - General (Family Medicine) Elmira Newman PARAS, MD as PCP - Cardiology (Cardiology) Pyrtle, Gordy HERO, MD as Consulting Physician (Gastroenterology) Tommas Pears, MD as Consulting Physician (Endocrinology) Ottelin, Mark, MD (Inactive) as Consulting Physician (Urology) Curtis Debby PARAS, MD as Consulting Physician (Sports Medicine) [place], Heather, OD (Optometry)  I have updated your Care Teams any recent Medical Services you may have received from other providers in the past year.     Assessment:   This is a routine wellness examination for [place].  Hearing/Vision screen Hearing Screening - Comments:: Patient declined hearing difficulties Vision Screening - Comments:: Patient wears glasses and getting contacts    Goals Addressed             This Visit's Progress    Patient Stated       Patient would love to be walking better        Depression Screen     01/20/2024   11:51 AM 01/19/2023   11:06 AM 01/15/2022   11:09 AM 11/14/2021   11:13 AM 02/02/2017    3:37 PM 03/05/2016   12:19 PM 01/16/2016   11:12 AM  PHQ 2/9 Scores  PHQ - 2 Score 0 0 0 0 0 0 0  PHQ- 9 Score 0          Fall Risk     01/20/2024   11:48 AM 01/19/2023   11:00 AM 01/15/2022   11:08 AM 11/14/2021   11:13 AM 02/02/2017    3:37 PM  Fall Risk   Falls in the past year? 1 0 0 0 No   Number falls in past yr: 0 0 0 0   Injury with Fall? 0 0 0 0   Risk for fall due to : History of fall(s) No Fall Risks No Fall Risks    Follow up Falls evaluation completed;Education provided;Falls prevention discussed Falls evaluation completed Falls evaluation completed        Data saved with a previous flowsheet row definition    MEDICARE RISK AT HOME:  Medicare Risk at Home Any stairs in or around the home?: Yes If so, are  there any without handrails?: No Home free of loose throw rugs in walkways, pet beds, electrical cords, etc?: Yes Adequate lighting in your home to reduce risk of falls?: Yes Life alert?: No Use of a cane, walker or w/c?: Yes (crutches) Grab bars in the bathroom?: Yes Shower chair or bench in shower?: Yes Elevated toilet seat or a handicapped toilet?: No  TIMED  UP AND GO:  Was the test performed?  No  Cognitive Function: 6CIT completed        01/20/2024   11:46 AM 01/19/2023   11:07 AM 01/15/2022   11:18 AM  6CIT Screen  What Year? 0 points 0 points 0 points  What month? 0 points 0 points 0 points  What time? 0 points 0 points 0 points  Count back from 20 0 points 0 points 0 points  Months in reverse 0 points 0 points 0 points  Repeat phrase 0 points 0 points 0 points  Total Score 0 points 0 points 0 points    Immunizations Immunization History  Administered Date(s) Administered   Fluad Quad(high Dose 65+) 05/02/2020   Influenza Split 04/27/2012   Influenza, High Dose Seasonal PF 05/07/2016, 04/27/2017, 04/14/2023   Influenza,inj,Quad PF,6+ Mos 05/25/2013, 04/19/2014, 05/01/2015   Influenza,inj,quad, With Preservative 03/28/2017   Influenza-Unspecified 04/11/2016, 03/28/2019   Moderna Covid-19 Fall Seasonal Vaccine 69yrs & older 04/14/2023   PFIZER(Purple Top)SARS-COV-2 Vaccination 09/03/2019, 09/28/2019, 07/23/2020   Pneumococcal Conjugate-13 10/31/2014   Pneumococcal Polysaccharide-23 05/01/2015   Rsv, Bivalent, Protein Subunit Rsvpref,pf [place]) 06/27/2022   Tdap 03/10/2013   Zoster, Live 07/29/2011    Screening Tests Health Maintenance  Topic Date Due   Zoster Vaccines- Shingrix (1 of 2) 07/11/1999   Colonoscopy  12/05/2022   DTaP/Tdap/Td (2 - Td or Tdap) 03/11/2023   Diabetic kidney evaluation - Urine ACR  08/08/2023   COVID-19 Vaccine (5 - 2024-25 season) 01/07/2025 (Originally 10/12/2023)   INFLUENZA VACCINE  02/26/2024   FOOT EXAM  03/22/2024    HEMOGLOBIN A1C  06/11/2024   Diabetic kidney evaluation - eGFR measurement  12/09/2024   OPHTHALMOLOGY EXAM  01/10/2025   Medicare Annual Wellness (AWV)  01/19/2025   MAMMOGRAM  11/22/2025   Pneumococcal Vaccine: 50+ Years  Completed   DEXA SCAN  Completed   Hepatitis C Screening  Completed   Hepatitis B Vaccines  Aged Out   HPV VACCINES  Aged Out   Meningococcal B Vaccine  Aged Out    Health Maintenance  Health Maintenance Due  Topic Date Due   Zoster Vaccines- Shingrix (1 of 2) 07/11/1999   Colonoscopy  12/05/2022   DTaP/Tdap/Td (2 - Td or Tdap) 03/11/2023   Diabetic kidney evaluation - Urine ACR  08/08/2023   Health Maintenance Items Addressed: Patient declined health maintenance  Additional Screening:  Vision Screening: Recommended annual ophthalmology exams for early detection of glaucoma and other disorders of the eye. Would you like a referral to an eye doctor? No    Dental Screening: Recommended annual dental exams for proper oral hygiene  Community Resource Referral / Chronic Care Management: CRR required this visit?  No   CCM required this visit?  No   Plan:    I have personally reviewed and noted the following in the patient's chart:   Medical and social history Use of alcohol, tobacco or illicit drugs  Current medications and supplements including opioid prescriptions. Patient is not currently taking opioid prescriptions. Functional ability and status Nutritional status Physical activity Advanced directives List of other physicians Hospitalizations, surgeries, and ER visits in previous 12 months Vitals Screenings to include cognitive, depression, and falls Referrals and appointments  In addition, I have reviewed and discussed with patient certain preventive protocols, quality metrics, and best practice recommendations. A written personalized care plan for preventive services as well as general preventive health recommendations were provided to  patient.   Lyle MARLA Right, CMA  01/20/2024   After Visit Summary: (MyChart) Due to this being a telephonic visit, the after visit summary with patients personalized plan was offered to patient via MyChart   Notes: Nothing significant to report at this time.

## 2024-01-27 ENCOUNTER — Telehealth: Payer: Self-pay

## 2024-01-27 NOTE — Telephone Encounter (Signed)
   Pre-operative Risk Assessment    Patient Name: Debbie Hayes  DOB: 04/23/1951 MRN: [id number]   Date of last office visit: 12/23/23 NEWMAN LAWRENCE, MD Date of next office visit: 03/24/24 [facility], MD   Request for Surgical Clearance    Procedure:  LEFT TOTAL KNEE ARTHROPLASTY  Date of Surgery:  Clearance TBD                                Surgeon:  FONDA OLMSTED, MD Surgeon's Group or Practice Name:  [facility] Phone number:  [phone]  EXT [phone] Fax number:  [phone]   ATTN: MAEOLA DIVERS   Type of Clearance Requested:   - Medical  - Pharmacy:  Hold Clopidogrel  (Plavix )     Type of Anesthesia:  Spinal   Additional requests/questions:    SignedLucie DELENA Ku   01/27/2024, 3:38 PM

## 2024-01-27 NOTE — Telephone Encounter (Signed)
 As long as no recurrent chest pain, low cardiac risk, okay to proceed.   Thanks MJP

## 2024-01-27 NOTE — Telephone Encounter (Signed)
 Dr. Elmira,  You saw this patient on 12/23/2023. Per protocol we request that you comment on his cardiac risk to proceed with  LEFT TOTAL KNEE ARTHROPLASTY on TBD since it has been less than 2 months since evaluated in the office. She is on Plavix  as she is allergic to Aspirin . Please send your comment to P CV Pre-Op Pool.  Thank you, Lamarr Satterfield DNP, ANP, AACC.

## 2024-01-27 NOTE — Telephone Encounter (Signed)
   Patient Name: Debbie Hayes  DOB: [birth year] MRN: [id number]  Primary Cardiologist: Newman JINNY Rogue Rafalski, MD  Chart reviewed as part of pre-operative protocol coverage. Given past medical history and time since last visit, based on ACC/AHA guidelines, Debbie Hayes is at acceptable risk for the planned procedure without further cardiovascular testing.   The patient was advised that if she develops new symptoms prior to surgery to contact our office to arrange for a follow-up visit, and she verbalized understanding.  Per office protocol, if patient is without any new symptoms or concerns at the time of their virtual visit, he/she may hold Plavix  for 5 days prior to procedure. Please resume Plavix  as soon as possible postprocedure, at the discretion of the surgeon.    I will route this recommendation to the requesting party via Epic fax function and remove from pre-op pool.  Please call with questions.  Lamarr Satterfield, NP 01/27/2024, 4:36 PM

## 2024-01-27 NOTE — Telephone Encounter (Signed)
   Patient Name: Debbie Hayes  DOB: 1951-07-26 MRN: [id number]  Primary Cardiologist: Newman JINNY Javarious Elsayed, MD  Chart reviewed as part of pre-operative protocol coverage. Given past medical history and time since last visit, based on ACC/AHA guidelines, [name] is at acceptable risk for the planned procedure without further cardiovascular testing.   The patient was advised that if she develops new symptoms prior to surgery to contact our office to arrange for a follow-up visit, and she verbalized understanding.  I will route this recommendation to the requesting party via Epic fax function and remove from pre-op pool.  Please call with questions.  Lamarr Satterfield, NP 01/27/2024, 4:34 PM

## 2024-01-28 ENCOUNTER — Encounter: Payer: Self-pay | Admitting: Family Medicine

## 2024-01-28 DIAGNOSIS — G8929 Other chronic pain: Secondary | ICD-10-CM

## 2024-01-28 MED ORDER — OXYCODONE HCL 10 MG PO TABS: 10.0000 mg | ORAL_TABLET | Freq: Three times a day (TID) | ORAL | 0 refills | Status: DC | PRN

## 2024-02-01 ENCOUNTER — Other Ambulatory Visit (HOSPITAL_COMMUNITY): Payer: Self-pay

## 2024-02-05 ENCOUNTER — Other Ambulatory Visit (HOSPITAL_COMMUNITY): Payer: Self-pay

## 2024-02-05 MED ORDER — METHYLPREDNISOLONE 4 MG PO TBPK
ORAL_TABLET | ORAL | 0 refills | Status: AC
  Filled 2024-02-05: qty 21, 6d supply, fill #0

## 2024-02-09 ENCOUNTER — Other Ambulatory Visit: Payer: Self-pay | Admitting: Family Medicine

## 2024-02-12 ENCOUNTER — Other Ambulatory Visit: Payer: Self-pay | Admitting: Family Medicine

## 2024-02-12 DIAGNOSIS — G8929 Other chronic pain: Secondary | ICD-10-CM

## 2024-02-12 MED ORDER — OXYCODONE HCL 10 MG PO TABS: 10.0000 mg | ORAL_TABLET | Freq: Three times a day (TID) | ORAL | 0 refills | Status: DC | PRN

## 2024-02-15 ENCOUNTER — Other Ambulatory Visit (HOSPITAL_COMMUNITY): Payer: Self-pay

## 2024-02-25 ENCOUNTER — Encounter: Payer: Self-pay | Admitting: Sports Medicine

## 2024-02-26 ENCOUNTER — Ambulatory Visit (INDEPENDENT_AMBULATORY_CARE_PROVIDER_SITE_OTHER): Admitting: Sports Medicine

## 2024-02-26 ENCOUNTER — Other Ambulatory Visit (INDEPENDENT_AMBULATORY_CARE_PROVIDER_SITE_OTHER)

## 2024-02-26 ENCOUNTER — Encounter: Payer: Self-pay | Admitting: Sports Medicine

## 2024-02-26 DIAGNOSIS — M79644 Pain in right finger(s): Secondary | ICD-10-CM

## 2024-02-26 MED ORDER — TRIAMCINOLONE ACETONIDE 40 MG/ML IJ SUSP
20.0000 mg | Freq: Once | INTRAMUSCULAR | Status: AC
  Administered 2024-02-26: 20 mg via INTRA_ARTICULAR

## 2024-02-26 NOTE — Addendum Note (Signed)
 Addended by: OLIVA-AVELLANEDA, Camri Molloy L on: 02/26/2024 11:10 AM   Modules accepted: Orders

## 2024-02-26 NOTE — Telephone Encounter (Signed)
 Patient scheduled.

## 2024-02-26 NOTE — Assessment & Plan Note (Signed)
 Pain radial aspect MCP, worse with palpation, no pain at the flexor sheath, no triggering. She is post [facility] arthroplasty. We injected the joint today per her request as she and her husband are going on vacation.

## 2024-02-26 NOTE — Progress Notes (Signed)
    Procedures performed today:    Procedure: Real-time Ultrasound Guided injection of the right first MCP Device: Samsung HS60  Verbal informed consent obtained.  Time-out conducted.  Noted no overlying erythema, induration, or other signs of local infection.  Skin prepped in a sterile fashion.  Local anesthesia: Topical Ethyl chloride.  With sterile technique and under real time ultrasound guidance: Synovitis noted, 0.5 cc lidocaine , 0.5 cc kenalog  40 injected easily. Advised to call if fevers/chills, erythema, induration, drainage, or persistent bleeding.  Images permanently stored and available for review in PACS.  Impression: Technically successful ultrasound guided injection.  Independent interpretation of notes and tests performed by another provider:   None.  Brief History, Exam, Impression, and Recommendations:    Pain of right thumb Pain radial aspect MCP, worse with palpation, no pain at the flexor sheath, no triggering. She is post [facility] arthroplasty. We injected the joint today per her request as she and her husband are going on vacation.    ____________________________________________ Debby PARAS. Curtis, M.D., ABFM., CAQSM., AME. Primary Care and Sports Medicine [facility]  Adjunct Professor of [facility] Medicine  [employer]  Restaurant manager, fast food

## 2024-03-02 NOTE — Progress Notes (Signed)
 Sent message, via epic in basket, requesting orders in epic from Careers adviser.

## 2024-03-07 NOTE — Patient Instructions (Addendum)
 SURGICAL WAITING ROOM VISITATION Patients having surgery or a procedure may have no more than 2 support people in the waiting area - these visitors may rotate.    Children under the age of 90 must have an adult with them who is not the patient.  If the patient needs to stay at the hospital during part of their recovery, the visitor guidelines for inpatient rooms apply. Pre-op nurse will coordinate an appropriate time for 1 support person to accompany patient in pre-op.  This support person may not rotate.    Please refer to the [facility] website for the visitor guidelines for Inpatients (after your surgery is over and you are in a regular room).       Your procedure is scheduled on: 03-22-24   Report to [facility] Main Entrance    Report to admitting at 6:45 AM   Call this number if you have problems the morning of surgery [phone]   Do not eat food :After Midnight.   After Midnight you may have the following liquids until 6:15 AM DAY OF SURGERY  Water  Non-Citrus Juices (without pulp, NO RED-Apple, White grape, White cranberry) Black Coffee (NO MILK/CREAM OR CREAMERS, sugar ok)  Clear Tea (NO MILK/CREAM OR CREAMERS, sugar ok) regular and decaf                             Plain Jell-O (NO RED)                                           Fruit ices (not with fruit pulp, NO RED)                                     Popsicles (NO RED)                                                               Sports drinks like Gatorade (NO RED)                   The day of surgery:  Drink ONE (1) Pre-Surgery  G2 by 6:15 AM the morning of surgery. Drink in one sitting. Do not sip.  This drink was given to you during your hospital  pre-op appointment visit. Nothing else to drink after completing the Pre-Surgery G2.          If you have questions, please contact your surgeon's office.   FOLLOW ANY ADDITIONAL PRE OP INSTRUCTIONS YOU RECEIVED FROM YOUR SURGEON'S OFFICE!!!      Oral Hygiene is also important to reduce your risk of infection.                                    Remember - BRUSH YOUR TEETH THE MORNING OF SURGERY WITH YOUR REGULAR TOOTHPASTE   Do NOT smoke after Midnight   Take these medicines the morning of surgery with A SIP OF WATER :    Allegra   Famotidine   Levothyroxine    Metoprolol    Qulipta    Okay to use inhalers, nasal spray   Tylenol  if needed  Stop all vitamins and herbal supplements 7 days before surgery  Hold Plavix  x7 days (do not take after 03-14-24)  How to Manage Your Diabetes Before and After Surgery  Why is it important to control my blood sugar before and after surgery? Improving blood sugar levels before and after surgery helps healing and can limit problems. A way of improving blood sugar control is eating a healthy diet by:  Eating less sugar and carbohydrates  Increasing activity/exercise  Talking with your doctor about reaching your blood sugar goals High blood sugars (greater than 180 mg/dL) can raise your risk of infections and slow your recovery, so you will need to focus on controlling your diabetes during the weeks before surgery. Make sure that the doctor who takes care of your diabetes knows about your planned surgery including the date and location.  How do I manage my blood sugar before surgery? Check your blood sugar at least 4 times a day, starting 2 days before surgery, to make sure that the level is not too high or low. Check your blood sugar the morning of your surgery when you wake up and every 2 hours until you get to the Short Stay unit. If your blood sugar is less than 70 mg/dL, you will need to treat for low blood sugar: Do not take insulin . Treat a low blood sugar (less than 70 mg/dL) with  cup of clear juice (cranberry or apple), 4 glucose tablets, OR glucose gel. Recheck blood sugar in 15 minutes after treatment (to make sure it is greater than 70 mg/dL). If your blood sugar is not greater  than 70 mg/dL on recheck, call [phone] for further instructions. Report your blood sugar to the short stay nurse when you get to Short Stay.  If you are admitted to the hospital after surgery: Your blood sugar will be checked by the staff and you will probably be given insulin  after surgery (instead of oral diabetes medicines) to make sure you have good blood sugar levels. The goal for blood sugar control after surgery is 80-180 mg/dL.   WHAT DO I DO ABOUT MY DIABETES MEDICATION?  Do not take oral diabetes medicines (pills) the morning of surgery (do not take Metformin  the morning of surgery)  Hold Semaglutide  7 days before surgery (do not take after 03-14-24).      DO NOT TAKE THE FOLLOWING 7 DAYS PRIOR TO SURGERY: Ozempic , Wegovy , Rybelsus  (Semaglutide ), Byetta  (exenatide ), Bydureon  (exenatide  ER), Victoza, Saxenda (liraglutide), or Trulicity (dulaglutide) Mounjaro (Tirzepatide) Adlyxin (Lixisenatide), Polyethylene Glycol Loxenatide.  Reviewed and Endorsed by [facility] Patient Education Committee, August 2015                              You may not have any metal on your body including hair pins, jewelry, and body piercing             Do not wear make-up, lotions, powders, perfumes or deodorant  Do not wear nail polish including gel and S&S, artificial/acrylic nails, or any other type of covering on natural nails including finger and toenails. If you have artificial nails, gel coating, etc. that needs to be removed by a nail salon please have this removed prior to surgery or surgery may need to be canceled/ delayed if the surgeon/ anesthesia feels like they are unable to  be safely monitored.   Do not shave  48 hours prior to surgery.    Do not bring valuables to the hospital. [facility] IS NOT RESPONSIBLE   FOR VALUABLES.   Contacts, dentures or bridgework may not be worn into surgery.   Bring small overnight bag day of surgery.   DO NOT BRING YOUR HOME MEDICATIONS TO THE  HOSPITAL. PHARMACY WILL DISPENSE MEDICATIONS LISTED ON YOUR MEDICATION LIST TO YOU DURING YOUR ADMISSION IN THE HOSPITAL!    Patients discharged on the day of surgery will not be allowed to drive home.  Someone NEEDS to stay with you for the first 24 hours after anesthesia.              Please read over the following fact sheets you were given: IF YOU HAVE QUESTIONS ABOUT YOUR PRE-OP INSTRUCTIONS PLEASE CALL [phone] Gwen  If you received a COVID test during your pre-op visit  it is requested that you wear a mask when out in public, stay away from anyone that may not be feeling well and notify your surgeon if you develop symptoms. If you test positive for Covid or have been in contact with anyone that has tested positive in the last 10 days please notify you surgeon.   Pre-operative 5 CHG Bath Instructions   You can play a key role in reducing the risk of infection after surgery. Your skin needs to be as free of germs as possible. You can reduce the number of germs on your skin by washing with CHG (chlorhexidine  gluconate) soap before surgery. CHG is an antiseptic soap that kills germs and continues to kill germs even after washing.   DO NOT use if you have an allergy to chlorhexidine /CHG or antibacterial soaps. If your skin becomes reddened or irritated, stop using the CHG and notify one of our RNs at [phone].   Please shower with the CHG soap starting 4 days before surgery using the following schedule:     Please keep in mind the following:  DO NOT shave, including legs and underarms, starting the day of your first shower.   You may shave your face at any point before/day of surgery.  Place clean sheets on your bed the day you start using CHG soap. Use a clean washcloth (not used since being washed) for each shower. DO NOT sleep with pets once you start using the CHG.   CHG Shower Instructions:  If you choose to wash your hair and private area, wash first with your normal  shampoo/soap.  After you use shampoo/soap, rinse your hair and body thoroughly to remove shampoo/soap residue.  Turn the water  OFF and apply about 3 tablespoons (45 ml) of CHG soap to a CLEAN washcloth.  Apply CHG soap ONLY FROM YOUR NECK DOWN TO YOUR TOES (washing for 3-5 minutes)  DO NOT use CHG soap on face, private areas, open wounds, or sores.  Pay special attention to the area where your surgery is being performed.  If you are having back surgery, having someone wash your back for you may be helpful. Wait 2 minutes after CHG soap is applied, then you may rinse off the CHG soap.  Pat dry with a clean towel  Put on clean clothes/pajamas   If you choose to wear lotion, please use ONLY the CHG-compatible lotions on the back of this paper.     Additional instructions for the day of surgery: DO NOT APPLY any lotions, deodorants, cologne, or perfumes.   Put  on clean/comfortable clothes.  Brush your teeth.  Ask your nurse before applying any prescription medications to the skin.      CHG Compatible Lotions   Aveeno Moisturizing lotion  Cetaphil Moisturizing Cream  Cetaphil Moisturizing Lotion  Clairol Herbal Essence Moisturizing Lotion, Dry Skin  Clairol Herbal Essence Moisturizing Lotion, Extra Dry Skin  Clairol Herbal Essence Moisturizing Lotion, Normal Skin  Curel Age Defying Therapeutic Moisturizing Lotion with Alpha Hydroxy  Curel Extreme Care Body Lotion  Curel Soothing Hands Moisturizing Hand Lotion  Curel Therapeutic Moisturizing Cream, Fragrance-Free  Curel Therapeutic Moisturizing Lotion, Fragrance-Free  Curel Therapeutic Moisturizing Lotion, Original Formula  Eucerin Daily Replenishing Lotion  Eucerin Dry Skin Therapy Plus Alpha Hydroxy Crme  Eucerin Dry Skin Therapy Plus Alpha Hydroxy Lotion  Eucerin Original Crme  Eucerin Original Lotion  Eucerin Plus Crme Eucerin Plus Lotion  Eucerin TriLipid Replenishing Lotion  Keri Anti-Bacterial Hand Lotion  Keri Deep  Conditioning Original Lotion Dry Skin Formula Softly Scented  Keri Deep Conditioning Original Lotion, Fragrance Free Sensitive Skin Formula  Keri Lotion Fast Absorbing Fragrance Free Sensitive Skin Formula  Keri Lotion Fast Absorbing Softly Scented Dry Skin Formula  Keri Original Lotion  Keri Skin Renewal Lotion Keri Silky Smooth Lotion  Keri Silky Smooth Sensitive Skin Lotion  Nivea Body Creamy Conditioning Oil  Nivea Body Extra Enriched Lotion  Nivea Body Original Lotion  Nivea Body Sheer Moisturizing Lotion Nivea Crme  Nivea Skin Firming Lotion  NutraDerm 30 Skin Lotion  NutraDerm Skin Lotion  NutraDerm Therapeutic Skin Cream  NutraDerm Therapeutic Skin Lotion  ProShield Protective Hand Cream  Provon moisturizing lotion   PATIENT SIGNATURE_________________________________  NURSE SIGNATURE__________________________________  ________________________________________________________________________    Nasario Exon  An incentive spirometer is a tool that can help keep your lungs clear and active. This tool measures how well you are filling your lungs with each breath. Taking long deep breaths may help reverse or decrease the chance of developing breathing (pulmonary) problems (especially infection) following: A long period of time when you are unable to move or be active. BEFORE THE PROCEDURE  If the spirometer includes an indicator to show your best effort, your nurse or respiratory therapist will set it to a desired goal. If possible, sit up straight or lean slightly forward. Try not to slouch. Hold the incentive spirometer in an upright position. INSTRUCTIONS FOR USE  Sit on the edge of your bed if possible, or sit up as far as you can in bed or on a chair. Hold the incentive spirometer in an upright position. Breathe out normally. Place the mouthpiece in your mouth and seal your lips tightly around it. Breathe in slowly and as deeply as possible, raising the piston  or the ball toward the top of the column. Hold your breath for 3-5 seconds or for as long as possible. Allow the piston or ball to fall to the bottom of the column. Remove the mouthpiece from your mouth and breathe out normally. Rest for a few seconds and repeat Steps 1 through 7 at least 10 times every 1-2 hours when you are awake. Take your time and take a few normal breaths between deep breaths. The spirometer may include an indicator to show your best effort. Use the indicator as a goal to work toward during each repetition. After each set of 10 deep breaths, practice coughing to be sure your lungs are clear. If you have an incision (the cut made at the time of surgery), support your incision when coughing by placing  a pillow or rolled up towels firmly against it. Once you are able to get out of bed, walk around indoors and cough well. You may stop using the incentive spirometer when instructed by your caregiver.  RISKS AND COMPLICATIONS Take your time so you do not get dizzy or light-headed. If you are in pain, you may need to take or ask for pain medication before doing incentive spirometry. It is harder to take a deep breath if you are having pain. AFTER USE Rest and breathe slowly and easily. It can be helpful to keep track of a log of your progress. Your caregiver can provide you with a simple table to help with this. If you are using the spirometer at home, follow these instructions: SEEK MEDICAL CARE IF:  You are having difficultly using the spirometer. You have trouble using the spirometer as often as instructed. Your pain medication is not giving enough relief while using the spirometer. You develop fever of 100.5 F (38.1 C) or higher. SEEK IMMEDIATE MEDICAL CARE IF:  You cough up bloody sputum that had not been present before. You develop fever of 102 F (38.9 C) or greater. You develop worsening pain at or near the incision site. MAKE SURE YOU:  Understand these  instructions. Will watch your condition. Will get help right away if you are not doing well or get worse. Document Released: 11/24/2006 Document Revised: 10/06/2011 Document Reviewed: 01/25/2007 [facility] Patient Information 2014 [place], [place].

## 2024-03-07 NOTE — Progress Notes (Addendum)
  Date of COVID positive in last 90 days:  No  PCP - Harlene Schroeder, MD Cardiologist - Newman Lawrence, MD  Cardiac clearance in Epic dated 01-27-24  Chest x-ray - 01-04-24 Epic EKG - 12-10-23 Epic Stress Test - 11-17-22 Epic ECHO - N/A Cardiac Cath - N/A Pacemaker/ICD device last checked:  N/A Spinal Cord Stimulator: N/A Cardiac CT - 10-17-22 Epic  Bowel Prep - N/A  Sleep Study - Yes, +sleep apnea CPAP - No, mouth guard  Fasting Blood Sugar - 120 to 140 Checks Blood Sugar - periodically   Semaglutide  Last dose of GLP1 agonist-  N/A GLP1 instructions:  Do not take after  03-14-24, patient aware   Last dose of SGLT-2 inhibitors-  N/A SGLT-2 instructions:  Do not take after    Blood Thinner Instructions:  Plavix  (hold x7 days, do not take after 03-14-24).  Patient aware Last dose:   Time: Aspirin  Instructions:  N/A Last Dose:  Activity level:  Can go up a flight of stairs and perform activities of daily living without stopping and without symptoms of chest pain or shortness of breath.  Currently having difficulty due to knee pain.  Anesthesia review:  CAD, DM  Patient denies shortness of breath, fever, cough and chest pain at PAT appointment  Patient verbalized understanding of instructions that were given to them at the PAT appointment. Patient was also instructed that they will need to review over the PAT instructions again at home before surgery.

## 2024-03-08 ENCOUNTER — Encounter: Admitting: Internal Medicine

## 2024-03-09 ENCOUNTER — Encounter (HOSPITAL_COMMUNITY)
Admission: RE | Admit: 2024-03-09 | Discharge: 2024-03-09 | Disposition: A | Discharge status: Home / Self Care | Source: Ambulatory Visit | Attending: Orthopedic Surgery | Admitting: Orthopedic Surgery

## 2024-03-09 ENCOUNTER — Encounter (HOSPITAL_COMMUNITY): Payer: Self-pay

## 2024-03-09 ENCOUNTER — Other Ambulatory Visit: Payer: Self-pay

## 2024-03-09 VITALS — BP 132/82 | HR 77 | Temp 98.3°F | Resp 16 | Ht 68.0 in | Wt 157.2 lb

## 2024-03-09 DIAGNOSIS — Z01812 Encounter for preprocedural laboratory examination: Secondary | ICD-10-CM | POA: Insufficient documentation

## 2024-03-09 DIAGNOSIS — E119 Type 2 diabetes mellitus without complications: Secondary | ICD-10-CM | POA: Diagnosis not present

## 2024-03-09 DIAGNOSIS — Z01818 Encounter for other preprocedural examination: Secondary | ICD-10-CM | POA: Diagnosis present

## 2024-03-09 HISTORY — DX: Pneumonia, unspecified organism: J18.9

## 2024-03-09 HISTORY — DX: Atherosclerotic heart disease of native coronary artery without angina pectoris: I25.10

## 2024-03-09 LAB — SURGICAL PCR SCREEN
MRSA, PCR: NEGATIVE
Staphylococcus aureus: POSITIVE — AB

## 2024-03-09 LAB — BASIC METABOLIC PANEL WITH GFR
Anion gap: 6 (ref 5–15)
BUN: 13 mg/dL (ref 8–23)
CO2: 28 mmol/L (ref 22–32)
Calcium: 8.8 mg/dL — ABNORMAL LOW (ref 8.9–10.3)
Chloride: 104 mmol/L (ref 98–111)
Creatinine, Ser: 0.45 mg/dL (ref 0.44–1.00)
GFR, Estimated: >60 mL/min (ref >60)
Glucose, Bld: 124 mg/dL — ABNORMAL HIGH (ref 70–99)
Potassium: 3.8 mmol/L (ref 3.5–5.1)
Sodium: 138 mmol/L (ref 135–145)

## 2024-03-09 LAB — GLUCOSE, CAPILLARY: Glucose-Capillary: 123 mg/dL — ABNORMAL HIGH (ref 70–99)

## 2024-03-09 LAB — CBC
HCT: 44.2 % (ref 36.0–46.0)
Hemoglobin: 13.8 g/dL (ref 12.0–15.0)
MCH: 28.5 pg (ref 26.0–34.0)
MCHC: 31.2 g/dL (ref 30.0–36.0)
MCV: 91.3 fL (ref 80.0–100.0)
Platelets: 230 K/uL (ref 150–400)
RBC: 4.84 MIL/uL (ref 3.87–5.11)
RDW: 13.6 % (ref 11.5–15.5)
WBC: 6.5 K/uL (ref 4.0–10.5)
nRBC: 0 % (ref 0.0–0.2)

## 2024-03-09 LAB — HEMOGLOBIN A1C
Hgb A1c MFr Bld: 6.3 % — ABNORMAL HIGH (ref 4.8–5.6)
Mean Plasma Glucose: 134 mg/dL

## 2024-03-09 MED ORDER — OXYCODONE HCL 10 MG PO TABS: ORAL_TABLET | ORAL | 0 refills | Status: DC

## 2024-03-09 NOTE — Addendum Note (Signed)
 Addended by: WATT RAISIN C on: 03/09/2024 06:30 PM   Modules accepted: Orders

## 2024-03-10 NOTE — Progress Notes (Signed)
PCR results sent to Dr. Mardelle Matte.

## 2024-03-11 ENCOUNTER — Other Ambulatory Visit (HOSPITAL_COMMUNITY): Payer: Self-pay

## 2024-03-11 MED ORDER — MUPIROCIN 2 % EX OINT
TOPICAL_OINTMENT | Freq: Three times a day (TID) | CUTANEOUS | 0 refills | Status: DC
  Filled 2024-03-11: qty 22, 7d supply, fill #0

## 2024-03-14 ENCOUNTER — Encounter: Payer: Self-pay | Admitting: Family Medicine

## 2024-03-14 DIAGNOSIS — G8929 Other chronic pain: Secondary | ICD-10-CM

## 2024-03-14 MED ORDER — TRAMADOL HCL 50 MG PO TABS: ORAL_TABLET | ORAL | 0 refills | Status: DC

## 2024-03-14 NOTE — H&P (Signed)
 KNEE ARTHROPLASTY ADMISSION H&P  Patient ID: KEMARA QUIGLEY MRN: [id number] DOB/AGE: 1951/02/15 73 y.o.  Chief Complaint: left knee pain.  Planned Procedure Date: 03/22/24 Medical Clearance by Dr. Ubaldo   Cardiac Clearance by Lamarr Satterfield, NP   HPI: PRISILA DLOUHY is a 73 y.o. female who presents for evaluation of Primary osteoarthritis of left knee. The patient has a history of pain and functional disability in the left knee due to arthritis and has failed non-surgical conservative treatments for greater than 12 weeks to include NSAID's and/or analgesics and activity modification.  Onset of symptoms was abrupt, starting 1 years ago with rapidlly worsening course since that time. The patient noted no past surgery on the left knee.  Patient currently rates pain at 7 out of 10 with activity. Patient has worsening of pain with activity and weight bearing and pain that interferes with activities of daily living.  There is no active infection.  Past Medical History:  Diagnosis Date   Allergy    Asthma    Cancer (HCC)    thyroid  cancer   Cataract    Chronic migraine BOTOX INJECTION EVERY 3 MONTHS   Chronic pain in right shoulder    Constipation    Coronary artery disease    Diabetes mellitus    Disorder of inner ear CAUSES VERTIGO OCCASIONALLY   Fibromyalgia    GERD (gastroesophageal reflux disease)    Hemorrhoid    History of bladder infections    History of thyroid  cancer 2009  S/P TOTAL THYROIDECTOMY AND RADIATION   Hyperlipidemia    Hypertension CARDIOLOGIST- DR BLANCA- WILL REQUEST LATE NOTE   DENIES S & S   Hypothyroidism    Insomnia    Left shoulder pain    Macular degeneration    MRSA infection    nasal treated more than 15 years ago   Normal nuclear stress test 01/25/2009   OA (osteoarthritis) JOINTS   Osteopenia    Osteoporosis    Painful orthopaedic hardware (HCC)    left foot   Pneumonia    PONV (postoperative nausea and vomiting) SEVERE    Rotator cuff disorder LEFT SHOULDER RTC IMPINGMENT   Sleep apnea    mouth guard, no cpap    Sleep apnea in adult 06/26/2017   Spondylolisthesis of lumbar region    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   Wears glasses    Past Surgical History:  Procedure Laterality Date   BACK SURGERY     Dr Mavis- spondylosis   BILATERAL CARPAL TUNNEL RELEASE  1994   BILATERAL ELBOW SURG.  1999   BILATERAL SALPINGOOPHECTOMY  1993   POST-OP URETER REPAIR 12 DAYS AFTER    CATARACT EXTRACTION, BILATERAL     COLONOSCOPY     EYE SURGERY     FOOT ARTHRODESIS Left 07/10/2016   Procedure: LEFT 2ND TARSAL METATARSAL ARTHRODESIS  GASTROC RECESSION LEFT LAPIDUS MODIFIED MCBRIDE BUIONECTOMY;  Surgeon: Norleen Armor, MD;  Location: [facility];  Service: Orthopedics;  Laterality: Left;   GASTROC RECESSION EXTREMITY Left 07/10/2016   Procedure: GASTROC RECESSION EXTREMITY;  Surgeon: Norleen Armor, MD;  Location: [facility];  Service: Orthopedics;  Laterality: Left;   HARDWARE REMOVAL Left 05/20/2018   Procedure: Left foot removal of deep implants;  Surgeon: Armor Norleen, MD;  Location: [facility];  Service: Orthopedics;  Laterality: Left;    LEFT THUMB JOINT REPLACEMENT  2005   RIGHT DONE IN 2004   OOPHORECTOMY  POLYPECTOMY     RIGHT KNEE ARTHROSCOPY  X2  BEFORE 2011   RIGHT KNEE ARTHROSCOPY/ PARTIAL LATERAL MENISECTOMY/ TRICOMPARTMENT CHONDROPLASTY/ DECOMPRESSION CYST  10/26/2009   RIGHT KNEE CLOSED MANIPULATION  09/11/2010   RIGHT SHOULDER ARTHROSCOPY  2010  &  2004   TIBIA CYST REMOVED AND ORIF LEG FX  1958   TONSILLECTOMY  1968   TOTAL KNEE ARTHROPLASTY  07/16/2010   RIGHT   TOTAL THYROIDECTOMY  2009   CANCER  (POST-OP BLEED)  AND RADIATION TX   trigger finger repair      07/2023   trigger fingers Right 2013   3rd and 4th fingers   UPPER GASTROINTESTINAL ENDOSCOPY     VAGINAL HYSTERECTOMY  1990   Allergies  Allergen Reactions   Sulfa Antibiotics  Hives   Statins Other (See Comments)    Pt has tried multiple and cannot tolerate   Prior to Admission medications   Medication Sig Start Date End Date Taking? Authorizing Provider  acetaminophen  (TYLENOL ) 500 MG tablet Take 500 mg by mouth every 6 (six) hours as needed for moderate pain (pain score 4-6).   Yes [provider]  alendronate  (FOSAMAX ) 70 MG tablet TAKE 1 TABLET(70 MG) BY MOUTH EVERY 7 DAYS WITH A FULL GLASS OF WATER AND ON AN EMPTY STOMACH 12/14/23  Yes Copland, Harlene BROCKS, MD  Atogepant  (QULIPTA ) 60 MG TABS Take 1 tablet (60 mg total) by mouth daily. 10/05/23  Yes Ines Onetha NOVAK, MD  clopidogrel  (PLAVIX ) 75 MG tablet Take 1 tablet (75 mg total) by mouth daily. 11/05/23  Yes Patwardhan, Manish J, MD  Cranberry 4200 MG CAPS Take 4,200 mg by mouth daily.   Yes [provider]  dicyclomine  (BENTYL ) 20 MG tablet Take 1 tablet (20 mg total) by mouth 4 (four) times daily as needed for spasms. 05/12/23  Yes Copland, Harlene BROCKS, MD  estradiol  (ESTRACE ) 0.1 MG/GM vaginal cream Place 1 Applicatorful vaginally 3 (three) times a week. Patient taking differently: Place 1 Applicatorful vaginally 2 (two) times a week. 11/25/23  Yes Stoneking, Adine PARAS., MD  Evolocumab  (REPATHA  SURECLICK) 140 MG/ML SOAJ Inject 140 mg into the skin every 14 (fourteen) days. 12/22/23  Yes Copland, Harlene BROCKS, MD  famotidine  (PEPCID ) 40 MG tablet Take 40 mg by mouth daily.   Yes [provider]  Fexofenadine HCl (ALLEGRA PO) Take 180 mg by mouth daily.   Yes [provider]  fluticasone  (FLOVENT  HFA) 110 MCG/ACT inhaler Inhale 1-2 puffs into the lungs 2 (two) times daily. 08/14/23  Yes Copland, Harlene BROCKS, MD  Guaifenesin (MUCINEX MAXIMUM STRENGTH) 1200 MG TB12 Take 1,200 mg by mouth daily as needed (congestion).   Yes [provider]  levothyroxine  (SYNTHROID ) 125 MCG tablet TAKE 1 TABLET BY MOUTH BEFORE BREAKFAST 02/09/24  Yes Copland, Harlene BROCKS, MD  losartan  (COZAAR ) 25 MG  tablet Take 1 tablet (25 mg total) by mouth daily. 01/11/24  Yes Copland, Harlene BROCKS, MD  Magnesium 400 MG CAPS Take 800 mg by mouth daily.   Yes [provider]  metFORMIN  (GLUCOPHAGE ) 500 MG tablet Take 1 tablet (500 mg total) by mouth daily. 11/13/23  Yes Copland, Jessica C, MD  metoprolol  succinate (TOPROL -XL) 50 MG 24 hr tablet TAKE 1 TABLET(50 MG) BY MOUTH DAILY WITH OR IMMEDIATELY FOLLOWING A MEAL 12/14/23  Yes Copland, Harlene BROCKS, MD  montelukast  (SINGULAIR ) 10 MG tablet Take 1 tablet (10 mg total) by mouth at bedtime. 09/17/23  Yes Copland, Harlene BROCKS, MD  ondansetron  (ZOFRAN -ODT) 8  MG disintegrating tablet Take 8 mg by mouth every 8 (eight) hours as needed for nausea or vomiting.   Yes [provider]  Polyethylene Glycol 3350  (MIRALAX PO) Take 17 g by mouth daily as needed (constipation).   Yes [provider]  Rimegepant Sulfate (NURTEC) 75 MG TBDP Take 75 mg by mouth daily as needed (migraine).   Yes [provider]  saccharomyces boulardii (FLORASTOR) 250 MG capsule Take 250 mg by mouth 2 (two) times daily.   Yes [provider]  Semaglutide , 1 MG/DOSE, 4 MG/3ML SOPN Inject 1 mg into the skin once a week. 11/02/23  Yes Copland, Harlene BROCKS, MD  traZODone  (DESYREL ) 100 MG tablet TAKE 2 AND 1/2 TABLETS(250 MG) BY MOUTH AT BEDTIME 10/14/23  Yes Copland, Harlene BROCKS, MD  zolpidem  (AMBIEN ) 5 MG tablet TAKE 1 TABLET(5 MG) BY MOUTH AT BEDTIME AS NEEDED FOR SLEEP 11/09/23  Yes Copland, Harlene BROCKS, MD  Albuterol  Sulfate (PROAIR  RESPICLICK) 108 (90 Base) MCG/ACT AEPB Inhale 2 puffs every 4-6 hours as needed for wheezing or shortness of breath 07/16/23   Copland, Harlene BROCKS, MD  blood glucose meter kit and supplies KIT Test blood sugar once daily. Dx code: E11.9 03/08/15   Humberto Elspeth LABOR, MD  Blood Glucose Monitoring Suppl (FREESTYLE LITE) DEVI Check blood sugar no more than twice daily 09/23/17   Copland, Harlene BROCKS, MD  Blood Glucose Monitoring Suppl (ONE TOUCH ULTRA 2)  w/Device KIT Dispense one meter and test strips, lancets- 100 of each with PRN RF 09/25/22   Copland, Harlene BROCKS, MD  glucose blood (FREESTYLE LITE) test strip CHECK BLOOD SUGARS NO MORE THAN TWICE DAILY 10/26/18   Copland, Harlene BROCKS, MD  Lancets (FREESTYLE) lancets Check blood sugars no more than twice daily 09/23/17   Copland, Harlene BROCKS, MD  levofloxacin  (LEVAQUIN ) 750 MG tablet Take 1 tablet (750 mg total) by mouth daily. Patient not taking: Reported on 03/07/2024 01/07/24   Copland, Harlene BROCKS, MD  methocarbamol  (ROBAXIN ) 500 MG tablet TAKE 1 TABLET(500 MG) BY MOUTH EVERY 8 HOURS AS NEEDED FOR MUSCLE SPASMS Patient not taking: Reported on 03/07/2024 01/20/24   Copland, Jessica C, MD  mupirocin  ointment (BACTROBAN ) 2 % Apply a small amount to nostrils 2-3 times per day for 7 days. 03/11/24     Oxycodone  HCl 10 MG TABS Take 1-1.5 tablets (10-15 mg total) by mouth every 8 (eight) hours as needed. 03/09/24   Copland, Harlene BROCKS, MD  predniSONE  (DELTASONE ) 20 MG tablet Take 40 mg daily for 3 days, then 20 mg daily for 3 days if needed.  If feeling better can stop use after 3 days Patient not taking: Reported on 01/20/2024 01/07/24   Copland, Harlene BROCKS, MD  sodium chloride  0.9 % SOLN 100 mL with Eptinezumab-jjmr 100 MG/ML SOLN 100 mg Inject 100 mg into the vein every 3 (three) months. Patient not taking: Reported on 01/20/2024 10/20/23   Ines Onetha NOVAK, MD   Social History   Socioeconomic History   Marital status: Married    Spouse name: Not on file   Number of children: 4   Years of education: Not on file   Highest education level: Bachelor's degree (e.g., BA, AB, BS)  Occupational History    Comment: retired Charity fundraiser  Tobacco Use   Smoking status: Never   Smokeless tobacco: Never  Vaping Use   Vaping status: Never Used  Substance and Sexual Activity   Alcohol use: Yes    Comment: RARE   Drug use:  No   Sexual activity: Not on file  Other Topics Concern   Not on file  Social History Narrative   Lives  with husband Dr Alm Roses.   They have 2 adult children that live out of state.   Homemaker    Social Drivers of Health   Financial Resource Strain: Low Risk  (01/20/2024)   Overall Financial Resource Strain (CARDIA)    Difficulty of Paying Living Expenses: Not hard at all  Food Insecurity: No Food Insecurity (01/20/2024)   Hunger Vital Sign    Worried About Running Out of Food in the Last Year: Never true    Ran Out of Food in the Last Year: Never true  Transportation Needs: No Transportation Needs (01/20/2024)   PRAPARE - Administrator, Civil Service (Medical): No    Lack of Transportation (Non-Medical): No  Physical Activity: Inactive (01/20/2024)   Exercise Vital Sign    Days of Exercise per Week: 0 days    Minutes of Exercise per Session: 0 min  Stress: No Stress Concern Present (01/20/2024)   [employer] of Occupational Health - Occupational Stress Questionnaire    Feeling of Stress: Only a little  Social Connections: Socially Integrated (01/20/2024)   Social Connection and Isolation Panel    Frequency of Communication with Friends and Family: More than three times a week    Frequency of Social Gatherings with Friends and Family: More than three times a week    Attends Religious Services: More than 4 times per year    Active Member of [employer] or Organizations: Yes    Attends Engineer, structural: More than 4 times per year    Marital Status: Married   Family History  Problem Relation Age of Onset   Alzheimer's disease Mother    Migraines Mother    Heart disease Father    Cirrhosis Father    Heart attack Father    Macular degeneration Father    Migraines Sister    Migraines Maternal Grandmother    Migraines Niece    Colon cancer Neg Hx    Esophageal cancer Neg Hx    Rectal cancer Neg Hx    Stomach cancer Neg Hx    Colon polyps Neg Hx     ROS: Currently denies lightheadedness, dizziness, Fever, chills, CP, SOB.   No personal history of  DVT, PE, MI, or CVA. No loose teeth or dentures All other systems have been reviewed and were otherwise currently negative with the exception of those mentioned in the HPI and as above.  Objective: Vitals: Ht: 5' 7 Wt: 159 lbs Temp: 98.8 BP: 113/69 Pulse: 78 O2 96% on room air.   Physical Exam: General: Alert, NAD.  Antalgic Gait  HEENT: EOMI, Good Neck Extension  Pulm: No increased work of breathing.  CV: No pretibial edema GI: soft, NT, ND Neuro: Neuro without gross focal deficit.  Sensation intact distally Skin: No lesions in the area of chief complaint MSK/Surgical Site: left knee w/o redness or effusion.  medial JLT. ROM 5-115.  5/5 strength in extension and flexion.  +EHL/FHL.  NVI.  Stable varus and valgus stress.    Imaging Review MRI demonstrates evidence for bone marrow edema both medially and laterally, with some chondral changes as well as a root tear of the posterior horn medial meniscus.  Preoperative templating of the joint replacement has been completed, documented, and submitted to the Operating Room personnel in order to optimize intra-operative equipment management.  Assessment:  Left knee diffuse bony changes with stress reaction, meniscal root tear.  Plan: Plan for Procedure(s): ARTHROPLASTY, KNEE, TOTAL  The patient history, physical exam, clinical judgement of the provider and imaging are consistent with end stage degenerative joint disease and total joint arthroplasty is deemed medically necessary. The treatment options including medical management, injection therapy, and arthroplasty were discussed at length. The risks and benefits of Procedure(s): ARTHROPLASTY, KNEE, TOTAL were presented and reviewed.  The risks of nonoperative treatment, versus surgical intervention including but not limited to continued pain, aseptic loosening, stiffness, dislocation/subluxation, infection, bleeding, nerve injury, blood clots, cardiopulmonary complications, morbidity,  mortality, among others were discussed. The patient verbalizes understanding and wishes to proceed with the plan.  Patient is being admitted for inpatient treatment for surgery, pain control, PT, prophylactic antibiotics, VTE prophylaxis, progressive ambulation, ADL's and discharge planning.   The patient is planning to be discharged home with OPPT in care of husband and family   Elynn Patteson K Yamilett Anastos, DEVONNA 03/14/2024 1:39 PM

## 2024-03-15 NOTE — Progress Notes (Signed)
 Anesthesia Chart Review   Case: [id number] Date/Time: 03/22/24 0900   Procedure: ARTHROPLASTY, KNEE, TOTAL (Left: Knee)   Anesthesia type: Spinal   Diagnosis: Primary osteoarthritis of left knee [M17.12]   Pre-op diagnosis: Primary osteoarthritis of left knee   Location: [facility] ROOM 08 / [facility]   Surgeons: Josefina Chew, MD       DISCUSSION:73 y.o. never smoker with h/o PONV, HTN, CAD, TMJ wears appliance at night, DM II (A1C 6.3), left knee OA scheduled for above procedure 03/22/2024 with Dr. Chew Josefina.   H/o L3-L5 lumbar fusion.   Pt last seen by cardiology 12/23/2023. Elevated CAC. Stress test 10/2022 with no ischemia.   Per cardiology preoperative evaluation 01/27/24, Chart reviewed as part of pre-operative protocol coverage. Given past medical history and time since last visit, based on ACC/AHA guidelines, Debbie Hayes is at acceptable risk for the planned procedure without further cardiovascular testing.    The patient was advised that if she develops new symptoms prior to surgery to contact our office to arrange for a follow-up visit, and she verbalized understanding.   Per office protocol, if patient is without any new symptoms or concerns at the time of their virtual visit, he/she may hold Plavix  for 5 days prior to procedure. Please resume Plavix  as soon as possible postprocedure, at the discretion of the surgeon.    Last dose of Plavix  03/14/2024.   Pt advised not to take Ozempic  1 week before surgery.   VS: BP 132/82   Pulse 77   Temp 36.8 C (Oral)   Resp 16   Ht 5' 8 (1.727 m)   Wt 71.3 kg   SpO2 98%   BMI 23.90 kg/m   PROVIDERS:  Copland, Harlene BROCKS, MD is PCP   Cardiologist - Newman Lawrence, MD   LABS: Labs reviewed: Acceptable for surgery. (all labs ordered are listed, but only abnormal results are displayed)  Labs Reviewed  SURGICAL PCR SCREEN - Abnormal; Notable for the following components:      Result Value   Staphylococcus aureus POSITIVE  (*)    All other components within normal limits  HEMOGLOBIN A1C - Abnormal; Notable for the following components:   Hgb A1c MFr Bld 6.3 (*)    All other components within normal limits  BASIC METABOLIC PANEL WITH GFR - Abnormal; Notable for the following components:   Glucose, Bld 124 (*)    Calcium  8.8 (*)    All other components within normal limits  GLUCOSE, CAPILLARY - Abnormal; Notable for the following components:   Glucose-Capillary 123 (*)    All other components within normal limits  CBC     IMAGES:   EKG:   CV: Lexiscan  Tetrofosmin stress test 11/17/2022: 1 Day Rest/Stress protocol.  Stress EKG is non-diagnostic, as this is pharmacological stress test using Lexiscan . Normal myocardial perfusion without evidence of ischemia or infarct. Left ventricular size is normal, wall thickness is preserved, calculated LVEF 65%. Low risk study. Past Medical History:  Diagnosis Date   Allergy    Asthma    Cancer (HCC)    thyroid  cancer   Cataract    Chronic migraine BOTOX INJECTION EVERY 3 MONTHS   Chronic pain in right shoulder    Constipation    Coronary artery disease    Diabetes mellitus    Disorder of inner ear CAUSES VERTIGO OCCASIONALLY   Fibromyalgia    GERD (gastroesophageal reflux disease)    Hemorrhoid    History of bladder infections  History of thyroid  cancer 2009  S/P TOTAL THYROIDECTOMY AND RADIATION   Hyperlipidemia    Hypertension CARDIOLOGIST- DR BLANCA- WILL REQUEST LATE NOTE   DENIES S & S   Hypothyroidism    Insomnia    Left shoulder pain    Macular degeneration    MRSA infection    nasal treated more than 15 years ago   Normal nuclear stress test 01/25/2009   OA (osteoarthritis) JOINTS   Osteopenia    Osteoporosis    Painful orthopaedic hardware (HCC)    left foot   Pneumonia    PONV (postoperative nausea and vomiting) SEVERE   Rotator cuff disorder LEFT SHOULDER RTC IMPINGMENT   Sleep apnea    mouth guard, no cpap    Sleep apnea  in adult 06/26/2017   Spondylolisthesis of lumbar region    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   Wears glasses     Past Surgical History:  Procedure Laterality Date   BACK SURGERY     Dr Mavis- spondylosis   BILATERAL CARPAL TUNNEL RELEASE  1994   BILATERAL ELBOW SURG.  1999   BILATERAL SALPINGOOPHECTOMY  1993   POST-OP URETER REPAIR 12 DAYS AFTER    CATARACT EXTRACTION, BILATERAL     COLONOSCOPY     EYE SURGERY     FOOT ARTHRODESIS Left 07/10/2016   Procedure: LEFT 2ND TARSAL METATARSAL ARTHRODESIS  GASTROC RECESSION LEFT LAPIDUS MODIFIED MCBRIDE BUIONECTOMY;  Surgeon: Norleen Armor, MD;  Location: [facility];  Service: Orthopedics;  Laterality: Left;   GASTROC RECESSION EXTREMITY Left 07/10/2016   Procedure: GASTROC RECESSION EXTREMITY;  Surgeon: Norleen Armor, MD;  Location: [facility];  Service: Orthopedics;  Laterality: Left;   HARDWARE REMOVAL Left 05/20/2018   Procedure: Left foot removal of deep implants;  Surgeon: Armor Norleen, MD;  Location: [facility];  Service: Orthopedics;  Laterality: Left;    LEFT THUMB JOINT REPLACEMENT  2005   RIGHT DONE IN 2004   OOPHORECTOMY     POLYPECTOMY     RIGHT KNEE ARTHROSCOPY  X2  BEFORE 2011   RIGHT KNEE ARTHROSCOPY/ PARTIAL LATERAL MENISECTOMY/ TRICOMPARTMENT CHONDROPLASTY/ DECOMPRESSION CYST  10/26/2009   RIGHT KNEE CLOSED MANIPULATION  09/11/2010   RIGHT SHOULDER ARTHROSCOPY  2010  &  2004   TIBIA CYST REMOVED AND ORIF LEG FX  1958   TONSILLECTOMY  1968   TOTAL KNEE ARTHROPLASTY  07/16/2010   RIGHT   TOTAL THYROIDECTOMY  2009   CANCER  (POST-OP BLEED)  AND RADIATION TX   trigger finger repair      07/2023   trigger fingers Right 2013   3rd and 4th fingers   UPPER GASTROINTESTINAL ENDOSCOPY     VAGINAL HYSTERECTOMY  1990    MEDICATIONS:  acetaminophen  (TYLENOL ) 500 MG tablet   Albuterol  Sulfate (PROAIR  RESPICLICK) 108 (90 Base) MCG/ACT AEPB   alendronate  (FOSAMAX ) 70 MG  tablet   Atogepant  (QULIPTA ) 60 MG TABS   blood glucose meter kit and supplies KIT   Blood Glucose Monitoring Suppl (FREESTYLE LITE) DEVI   Blood Glucose Monitoring Suppl (ONE TOUCH ULTRA 2) w/Device KIT   clopidogrel  (PLAVIX ) 75 MG tablet   Cranberry 4200 MG CAPS   dicyclomine  (BENTYL ) 20 MG tablet   estradiol  (ESTRACE ) 0.1 MG/GM vaginal cream   Evolocumab  (REPATHA  SURECLICK) 140 MG/ML SOAJ   famotidine  (PEPCID ) 40 MG tablet   Fexofenadine HCl (ALLEGRA PO)   fluticasone  (FLOVENT  HFA) 110 MCG/ACT inhaler   glucose blood (  FREESTYLE LITE) test strip   Guaifenesin (MUCINEX MAXIMUM STRENGTH) 1200 MG TB12   Lancets (FREESTYLE) lancets   levothyroxine  (SYNTHROID ) 125 MCG tablet   losartan  (COZAAR ) 25 MG tablet   Magnesium 400 MG CAPS   metFORMIN  (GLUCOPHAGE ) 500 MG tablet   metoprolol  succinate (TOPROL -XL) 50 MG 24 hr tablet   montelukast  (SINGULAIR ) 10 MG tablet   mupirocin  ointment (BACTROBAN ) 2 %   ondansetron  (ZOFRAN -ODT) 8 MG disintegrating tablet   Oxycodone  HCl 10 MG TABS   Polyethylene Glycol 3350  (MIRALAX PO)   Rimegepant Sulfate (NURTEC) 75 MG TBDP   saccharomyces boulardii (FLORASTOR) 250 MG capsule   Semaglutide , 1 MG/DOSE, 4 MG/3ML SOPN   traMADol  (ULTRAM ) 50 MG tablet   traZODone  (DESYREL ) 100 MG tablet   zolpidem  (AMBIEN ) 5 MG tablet   No current facility-administered medications for this encounter.      Harlene Hoots Ward, PA-C [facility] [phone]

## 2024-03-15 NOTE — Anesthesia Preprocedure Evaluation (Addendum)
 Anesthesia Evaluation  Patient identified by MRN, date of birth, ID band Patient awake    Reviewed: Allergy & Precautions, NPO status , Patient's Chart, lab work & pertinent test results  History of Anesthesia Complications (+) PONV and history of anesthetic complications  Airway Mallampati: II  TM Distance: >3 FB Neck ROM: Full    Dental  (+) Dental Advisory Given   Pulmonary asthma , sleep apnea    Pulmonary exam normal        Cardiovascular hypertension, Pt. on medications Normal cardiovascular exam     Neuro/Psych  Headaches    GI/Hepatic ,GERD  Medicated and Controlled,,  Endo/Other  diabetes, Type 2, Oral Hypoglycemic Agents    Renal/GU      Musculoskeletal  (+) Arthritis , Osteoarthritis,  Fibromyalgia -  Abdominal   Peds  Hematology   Anesthesia Other Findings   Reproductive/Obstetrics                              Anesthesia Physical Anesthesia Plan  ASA: III  Anesthesia Plan: Spinal   Post-op Pain Management: Regional block*   Induction: Intravenous  PONV Risk Score and Plan: 3 and Propofol  infusion, Treatment may vary due to age or medical condition, Ondansetron  and Midazolam   Airway Management Planned: Simple Face Mask  Additional Equipment:   Intra-op Plan:   Post-operative Plan:   Informed Consent: I have reviewed the patients History and Physical, chart, labs and discussed the procedure including the risks, benefits and alternatives for the proposed anesthesia with the patient or authorized representative who has indicated his/her understanding and acceptance.       Plan Discussed with: CRNA and Surgeon  Anesthesia Plan Comments: (See PAT note 03/09/2024)         Anesthesia Quick Evaluation

## 2024-03-18 ENCOUNTER — Encounter: Payer: Self-pay | Admitting: Family Medicine

## 2024-03-21 ENCOUNTER — Other Ambulatory Visit: Payer: Self-pay | Admitting: Family Medicine

## 2024-03-21 DIAGNOSIS — E782 Mixed hyperlipidemia: Secondary | ICD-10-CM

## 2024-03-22 ENCOUNTER — Other Ambulatory Visit (HOSPITAL_COMMUNITY): Payer: Self-pay

## 2024-03-22 ENCOUNTER — Encounter (HOSPITAL_COMMUNITY): Payer: Self-pay | Admitting: Orthopedic Surgery

## 2024-03-22 ENCOUNTER — Ambulatory Visit (HOSPITAL_COMMUNITY)

## 2024-03-22 ENCOUNTER — Ambulatory Visit (HOSPITAL_BASED_OUTPATIENT_CLINIC_OR_DEPARTMENT_OTHER): Payer: Self-pay

## 2024-03-22 ENCOUNTER — Ambulatory Visit (HOSPITAL_COMMUNITY): Payer: Self-pay | Admitting: Medical

## 2024-03-22 ENCOUNTER — Ambulatory Visit (HOSPITAL_COMMUNITY)
Admission: RE | Admit: 2024-03-22 | Discharge: 2024-03-22 | Disposition: A | Discharge status: Home / Self Care | Source: Ambulatory Visit | Attending: Orthopedic Surgery | Admitting: Orthopedic Surgery

## 2024-03-22 ENCOUNTER — Other Ambulatory Visit: Payer: Self-pay

## 2024-03-22 ENCOUNTER — Encounter (HOSPITAL_COMMUNITY)
Admission: RE | Disposition: A | Discharge status: Home / Self Care | Payer: Self-pay | Source: Ambulatory Visit | Attending: Orthopedic Surgery

## 2024-03-22 DIAGNOSIS — M1712 Unilateral primary osteoarthritis, left knee: Secondary | ICD-10-CM | POA: Diagnosis present

## 2024-03-22 DIAGNOSIS — E119 Type 2 diabetes mellitus without complications: Secondary | ICD-10-CM | POA: Diagnosis not present

## 2024-03-22 DIAGNOSIS — I10 Essential (primary) hypertension: Secondary | ICD-10-CM | POA: Diagnosis not present

## 2024-03-22 DIAGNOSIS — Z79899 Other long term (current) drug therapy: Secondary | ICD-10-CM | POA: Diagnosis not present

## 2024-03-22 DIAGNOSIS — G4733 Obstructive sleep apnea (adult) (pediatric): Secondary | ICD-10-CM

## 2024-03-22 DIAGNOSIS — Z7985 Long-term (current) use of injectable non-insulin antidiabetic drugs: Secondary | ICD-10-CM | POA: Diagnosis not present

## 2024-03-22 DIAGNOSIS — M797 Fibromyalgia: Secondary | ICD-10-CM | POA: Diagnosis not present

## 2024-03-22 DIAGNOSIS — J45909 Unspecified asthma, uncomplicated: Secondary | ICD-10-CM

## 2024-03-22 DIAGNOSIS — K219 Gastro-esophageal reflux disease without esophagitis: Secondary | ICD-10-CM | POA: Insufficient documentation

## 2024-03-22 DIAGNOSIS — Z7984 Long term (current) use of oral hypoglycemic drugs: Secondary | ICD-10-CM | POA: Insufficient documentation

## 2024-03-22 DIAGNOSIS — G4730 Sleep apnea, unspecified: Secondary | ICD-10-CM | POA: Diagnosis not present

## 2024-03-22 DIAGNOSIS — M25762 Osteophyte, left knee: Secondary | ICD-10-CM | POA: Diagnosis not present

## 2024-03-22 HISTORY — PX: TOTAL KNEE ARTHROPLASTY: SHX125

## 2024-03-22 LAB — GLUCOSE, CAPILLARY
Glucose-Capillary: 148 mg/dL — ABNORMAL HIGH (ref 70–99)
Glucose-Capillary: 153 mg/dL — ABNORMAL HIGH (ref 70–99)
Glucose-Capillary: 249 mg/dL — ABNORMAL HIGH (ref 70–99)

## 2024-03-22 SURGERY — ARTHROPLASTY, KNEE, TOTAL
Anesthesia: Spinal | Site: Knee | Laterality: Left

## 2024-03-22 MED ORDER — DEXAMETHASONE SODIUM PHOSPHATE 10 MG/ML IJ SOLN
INTRAMUSCULAR | Status: DC | PRN
  Administered 2024-03-22: 5 mg via INTRAVENOUS

## 2024-03-22 MED ORDER — PHENYLEPHRINE 80 MCG/ML (10ML) SYRINGE FOR IV PUSH (FOR BLOOD PRESSURE SUPPORT)
PREFILLED_SYRINGE | INTRAVENOUS | Status: AC
  Filled 2024-03-22: qty 10

## 2024-03-22 MED ORDER — PHENYLEPHRINE 80 MCG/ML (10ML) SYRINGE FOR IV PUSH (FOR BLOOD PRESSURE SUPPORT)
PREFILLED_SYRINGE | INTRAVENOUS | Status: DC | PRN
  Administered 2024-03-22 (×4): 80 ug via INTRAVENOUS

## 2024-03-22 MED ORDER — CHLORHEXIDINE GLUCONATE 4 % EX SOLN: 1.0000 | CUTANEOUS | 1 refills | Status: DC

## 2024-03-22 MED ORDER — PROPOFOL 1000 MG/100ML IV EMUL
INTRAVENOUS | Status: AC
  Filled 2024-03-22: qty 100

## 2024-03-22 MED ORDER — OXYCODONE HCL 5 MG PO TABS
ORAL_TABLET | ORAL | Status: AC
  Filled 2024-03-22: qty 1

## 2024-03-22 MED ORDER — ONDANSETRON 8 MG PO TBDP: 8.0000 mg | ORAL_TABLET | Freq: Three times a day (TID) | ORAL | 0 refills | Status: AC | PRN

## 2024-03-22 MED ORDER — POVIDONE-IODINE 10 % EX SWAB: 2.0000 | Freq: Once | CUTANEOUS | Status: DC

## 2024-03-22 MED ORDER — METHOCARBAMOL 1000 MG/10ML IJ SOLN
500.0000 mg | Freq: Four times a day (QID) | INTRAMUSCULAR | Status: DC | PRN
Start: 2024-03-22 — End: 2024-03-22

## 2024-03-22 MED ORDER — OXYCODONE HCL 5 MG PO TABS
10.0000 mg | ORAL_TABLET | ORAL | Status: DC | PRN
  Administered 2024-03-22: 10 mg via ORAL

## 2024-03-22 MED ORDER — 0.9 % SODIUM CHLORIDE (POUR BTL) OPTIME
TOPICAL | Status: DC | PRN
  Administered 2024-03-22: 1000 mL

## 2024-03-22 MED ORDER — LACTATED RINGERS IV SOLN: INTRAVENOUS | Status: DC

## 2024-03-22 MED ORDER — ALBUMIN HUMAN 5 % IV SOLN
INTRAVENOUS | Status: AC
  Filled 2024-03-22: qty 250

## 2024-03-22 MED ORDER — METHOCARBAMOL 500 MG PO TABS
500.0000 mg | ORAL_TABLET | Freq: Four times a day (QID) | ORAL | Status: DC | PRN
  Administered 2024-03-22: 500 mg via ORAL

## 2024-03-22 MED ORDER — FENTANYL CITRATE (PF) 100 MCG/2ML IJ SOLN
INTRAMUSCULAR | Status: DC | PRN
  Administered 2024-03-22 (×2): 50 ug via INTRAVENOUS

## 2024-03-22 MED ORDER — ROPIVACAINE HCL 5 MG/ML IJ SOLN
INTRAMUSCULAR | Status: DC | PRN
Start: 2024-03-22 — End: 2024-03-22
  Administered 2024-03-22: 20 mL via PERINEURAL

## 2024-03-22 MED ORDER — KETOROLAC TROMETHAMINE 15 MG/ML IJ SOLN
INTRAMUSCULAR | Status: AC
  Filled 2024-03-22: qty 1

## 2024-03-22 MED ORDER — OXYCODONE HCL 5 MG/5ML PO SOLN: 5.0000 mg | Freq: Once | ORAL | Status: AC | PRN

## 2024-03-22 MED ORDER — CEFAZOLIN SODIUM-DEXTROSE 2-4 GM/100ML-% IV SOLN
2.0000 g | INTRAVENOUS | Status: AC
  Administered 2024-03-22: 2 g via INTRAVENOUS
  Filled 2024-03-22: qty 100

## 2024-03-22 MED ORDER — MUPIROCIN 2 % EX OINT: 1.0000 | TOPICAL_OINTMENT | Freq: Two times a day (BID) | CUTANEOUS | 0 refills | Status: AC

## 2024-03-22 MED ORDER — METHOCARBAMOL 500 MG PO TABS
ORAL_TABLET | ORAL | Status: AC
  Filled 2024-03-22: qty 1

## 2024-03-22 MED ORDER — HYDROMORPHONE HCL 2 MG PO TABS: 2.0000 mg | ORAL_TABLET | ORAL | 0 refills | Status: DC | PRN

## 2024-03-22 MED ORDER — ACETAMINOPHEN 500 MG PO TABS
1000.0000 mg | ORAL_TABLET | Freq: Once | ORAL | Status: AC
  Administered 2024-03-22: 1000 mg via ORAL
  Filled 2024-03-22: qty 2

## 2024-03-22 MED ORDER — LACTATED RINGERS IV BOLUS
250.0000 mL | Freq: Once | INTRAVENOUS | Status: AC
  Administered 2024-03-22: 250 mL via INTRAVENOUS

## 2024-03-22 MED ORDER — ACETAMINOPHEN 500 MG PO TABS
ORAL_TABLET | ORAL | Status: AC
  Filled 2024-03-22: qty 2

## 2024-03-22 MED ORDER — KETOROLAC TROMETHAMINE 30 MG/ML IJ SOLN
INTRAMUSCULAR | Status: DC | PRN
  Administered 2024-03-22: 31 mL

## 2024-03-22 MED ORDER — SODIUM CHLORIDE 0.9 % IR SOLN
Status: DC | PRN
  Administered 2024-03-22: 1000 mL

## 2024-03-22 MED ORDER — CEFAZOLIN SODIUM-DEXTROSE 2-4 GM/100ML-% IV SOLN
INTRAVENOUS | Status: AC
  Filled 2024-03-22: qty 100

## 2024-03-22 MED ORDER — ORAL CARE MOUTH RINSE: 15.0000 mL | Freq: Once | OROMUCOSAL | Status: AC

## 2024-03-22 MED ORDER — KETOROLAC TROMETHAMINE 30 MG/ML IJ SOLN
INTRAMUSCULAR | Status: AC
Start: 2024-03-22 — End: 2024-03-22
  Filled 2024-03-22: qty 1

## 2024-03-22 MED ORDER — ONDANSETRON HCL 4 MG PO TABS: 4.0000 mg | ORAL_TABLET | Freq: Four times a day (QID) | ORAL | Status: DC | PRN

## 2024-03-22 MED ORDER — ONDANSETRON HCL 4 MG/2ML IJ SOLN: 4.0000 mg | Freq: Four times a day (QID) | INTRAMUSCULAR | Status: DC | PRN

## 2024-03-22 MED ORDER — OXYCODONE HCL 5 MG PO TABS
ORAL_TABLET | ORAL | Status: AC
  Filled 2024-03-22: qty 2

## 2024-03-22 MED ORDER — CHLORHEXIDINE GLUCONATE 0.12 % MT SOLN
15.0000 mL | Freq: Once | OROMUCOSAL | Status: AC
  Administered 2024-03-22: 15 mL via OROMUCOSAL

## 2024-03-22 MED ORDER — METHOCARBAMOL 500 MG PO TABS
500.0000 mg | ORAL_TABLET | Freq: Three times a day (TID) | ORAL | 0 refills | Status: AC | PRN
Start: 2024-03-22 — End: ?

## 2024-03-22 MED ORDER — HYDROMORPHONE HCL 1 MG/ML IJ SOLN: 0.2500 mg | INTRAMUSCULAR | Status: DC | PRN

## 2024-03-22 MED ORDER — OXYCODONE HCL 5 MG PO TABS: 15.0000 mg | ORAL_TABLET | ORAL | Status: DC | PRN

## 2024-03-22 MED ORDER — INSULIN ASPART 100 UNIT/ML IJ SOLN: 0.0000 [IU] | INTRAMUSCULAR | Status: DC | PRN

## 2024-03-22 MED ORDER — PHENYLEPHRINE HCL-NACL 20-0.9 MG/250ML-% IV SOLN
INTRAVENOUS | Status: AC
  Filled 2024-03-22: qty 250

## 2024-03-22 MED ORDER — PROPOFOL 500 MG/50ML IV EMUL
INTRAVENOUS | Status: DC | PRN
  Administered 2024-03-22: 60 ug/kg/min via INTRAVENOUS
  Administered 2024-03-22: 50 mg via INTRAVENOUS
  Administered 2024-03-22: 40 mg via INTRAVENOUS
  Administered 2024-03-22: 20 mg via INTRAVENOUS
  Administered 2024-03-22: 50 mg via INTRAVENOUS
  Administered 2024-03-22: 40 mg via INTRAVENOUS

## 2024-03-22 MED ORDER — WATER FOR IRRIGATION, STERILE IR SOLN
Status: DC | PRN
  Administered 2024-03-22: 2000 mL

## 2024-03-22 MED ORDER — METOCLOPRAMIDE HCL 5 MG/ML IJ SOLN: 5.0000 mg | Freq: Three times a day (TID) | INTRAMUSCULAR | Status: DC | PRN

## 2024-03-22 MED ORDER — LACTATED RINGERS IV BOLUS
500.0000 mL | Freq: Once | INTRAVENOUS | Status: AC
  Administered 2024-03-22: 500 mL via INTRAVENOUS

## 2024-03-22 MED ORDER — MIDAZOLAM HCL 2 MG/2ML IJ SOLN
INTRAMUSCULAR | Status: AC
  Filled 2024-03-22: qty 2

## 2024-03-22 MED ORDER — CEFAZOLIN SODIUM-DEXTROSE 2-4 GM/100ML-% IV SOLN
2.0000 g | Freq: Four times a day (QID) | INTRAVENOUS | Status: DC
  Administered 2024-03-22: 2 g via INTRAVENOUS

## 2024-03-22 MED ORDER — BUPIVACAINE HCL (PF) 0.25 % IJ SOLN
INTRAMUSCULAR | Status: AC
Start: 2024-03-22 — End: 2024-03-22
  Filled 2024-03-22: qty 30

## 2024-03-22 MED ORDER — ONDANSETRON HCL 4 MG/2ML IJ SOLN
INTRAMUSCULAR | Status: AC
  Filled 2024-03-22: qty 2

## 2024-03-22 MED ORDER — KETOROLAC TROMETHAMINE 15 MG/ML IJ SOLN
7.5000 mg | Freq: Four times a day (QID) | INTRAMUSCULAR | Status: DC
  Administered 2024-03-22: 7.5 mg via INTRAVENOUS

## 2024-03-22 MED ORDER — METOCLOPRAMIDE HCL 5 MG PO TABS: 5.0000 mg | ORAL_TABLET | Freq: Three times a day (TID) | ORAL | Status: DC | PRN

## 2024-03-22 MED ORDER — ACETAMINOPHEN 500 MG PO TABS
1000.0000 mg | ORAL_TABLET | Freq: Four times a day (QID) | ORAL | Status: DC
  Administered 2024-03-22: 1000 mg via ORAL

## 2024-03-22 MED ORDER — OXYCODONE HCL 5 MG PO TABS
5.0000 mg | ORAL_TABLET | Freq: Once | ORAL | Status: AC | PRN
  Administered 2024-03-22: 5 mg via ORAL

## 2024-03-22 MED ORDER — ALBUMIN HUMAN 5 % IV SOLN: INTRAVENOUS | Status: DC | PRN

## 2024-03-22 MED ORDER — MIDAZOLAM HCL 2 MG/2ML IJ SOLN
INTRAMUSCULAR | Status: DC | PRN
  Administered 2024-03-22 (×2): 1 mg via INTRAVENOUS

## 2024-03-22 MED ORDER — BUPIVACAINE IN DEXTROSE 0.75-8.25 % IT SOLN
INTRATHECAL | Status: DC | PRN
  Administered 2024-03-22: 2 mL via INTRATHECAL

## 2024-03-22 MED ORDER — SODIUM CHLORIDE 0.9 % IV SOLN: 12.5000 mg | INTRAVENOUS | Status: DC | PRN

## 2024-03-22 MED ORDER — DEXAMETHASONE SODIUM PHOSPHATE 10 MG/ML IJ SOLN
INTRAMUSCULAR | Status: AC
  Filled 2024-03-22: qty 1

## 2024-03-22 MED ORDER — FENTANYL CITRATE (PF) 100 MCG/2ML IJ SOLN
INTRAMUSCULAR | Status: AC
  Filled 2024-03-22: qty 2

## 2024-03-22 MED ORDER — TRANEXAMIC ACID-NACL 1000-0.7 MG/100ML-% IV SOLN
1000.0000 mg | INTRAVENOUS | Status: AC
  Administered 2024-03-22: 1000 mg via INTRAVENOUS
  Filled 2024-03-22: qty 100

## 2024-03-22 MED ORDER — ONDANSETRON HCL 4 MG/2ML IJ SOLN
INTRAMUSCULAR | Status: DC | PRN
  Administered 2024-03-22: 4 mg via INTRAVENOUS

## 2024-03-22 MED ORDER — HYDROMORPHONE HCL 1 MG/ML IJ SOLN: 0.5000 mg | INTRAMUSCULAR | Status: DC | PRN

## 2024-03-22 SURGICAL SUPPLY — 48 items
ATTUNE PS FEM LT SZ 5 CEM KNEE (Femur) IMPLANT
BAG COUNTER SPONGE SURGICOUNT (BAG) IMPLANT
BAG ZIPLOCK 12X15 (MISCELLANEOUS) IMPLANT
BASEPLATE TIB CMT FB PCKT SZ5 (Knees) IMPLANT
BLADE SAG 18X100X1.27 (BLADE) ×1 IMPLANT
BLADE SAW SGTL 11.0X1.19X90.0M (BLADE) IMPLANT
BLADE SAW SGTL 13X75X1.27 (BLADE) ×1 IMPLANT
BLADE SURG 15 STRL LF DISP TIS (BLADE) ×1 IMPLANT
BNDG ELASTIC 6X10 VLCR STRL LF (GAUZE/BANDAGES/DRESSINGS) ×1 IMPLANT
BOWL SMART MIX CTS (DISPOSABLE) ×1 IMPLANT
CEMENT HV SMART SET (Cement) ×2 IMPLANT
CLSR STERI-STRIP ANTIMIC 1/2X4 (GAUZE/BANDAGES/DRESSINGS) ×2 IMPLANT
COVER SURGICAL LIGHT HANDLE (MISCELLANEOUS) ×1 IMPLANT
CUFF TRNQT CYL 34X4.125X (TOURNIQUET CUFF) ×1 IMPLANT
DRAPE SHEET LG 3/4 BI-LAMINATE (DRAPES) ×1 IMPLANT
DRAPE U-SHAPE 47X51 STRL (DRAPES) ×1 IMPLANT
DRSG MEPILEX POST OP 4X12 (GAUZE/BANDAGES/DRESSINGS) ×1 IMPLANT
DURAPREP 26ML APPLICATOR (WOUND CARE) ×2 IMPLANT
ELECT REM PT RETURN 15FT ADLT (MISCELLANEOUS) ×1 IMPLANT
GAUZE PAD ABD 8X10 STRL (GAUZE/BANDAGES/DRESSINGS) ×2 IMPLANT
GLOVE BIO SURGEON STRL SZ 6.5 (GLOVE) ×1 IMPLANT
GLOVE BIO SURGEON STRL SZ7.5 (GLOVE) ×1 IMPLANT
GLOVE BIOGEL PI IND STRL 7.0 (GLOVE) ×1 IMPLANT
GLOVE BIOGEL PI IND STRL 8 (GLOVE) ×1 IMPLANT
GOWN STRL SURGICAL XL XLNG (GOWN DISPOSABLE) ×2 IMPLANT
HOLDER FOLEY CATH W/STRAP (MISCELLANEOUS) ×1 IMPLANT
IMMOBILIZER KNEE 20 THIGH 36 (SOFTGOODS) ×1 IMPLANT
INSERT TIB ATTUNE FB SZ5X12 (Insert) IMPLANT
KIT TURNOVER KIT A (KITS) ×1 IMPLANT
MANIFOLD NEPTUNE II (INSTRUMENTS) ×1 IMPLANT
NS IRRIG 1000ML POUR BTL (IV SOLUTION) ×1 IMPLANT
PACK TOTAL KNEE CUSTOM (KITS) ×1 IMPLANT
PATELLA MEDIAL ATTUN 35MM KNEE (Knees) IMPLANT
PENCIL SMOKE EVACUATOR (MISCELLANEOUS) ×1 IMPLANT
PIN STEINMAN FIXATION KNEE (PIN) IMPLANT
PROTECTOR NERVE ULNAR (MISCELLANEOUS) ×1 IMPLANT
SET HNDPC FAN SPRY TIP SCT (DISPOSABLE) ×1 IMPLANT
SET PAD KNEE POSITIONER (MISCELLANEOUS) ×1 IMPLANT
SPIKE FLUID TRANSFER (MISCELLANEOUS) IMPLANT
SUT MNCRL AB 3-0 PS2 18 (SUTURE) IMPLANT
SUT STRATAFIX PDS+ 0 24IN (SUTURE) ×1 IMPLANT
SUT VIC AB 0 CT1 36 (SUTURE) IMPLANT
SUT VIC AB 2-0 CT1 TAPERPNT 27 (SUTURE) ×2 IMPLANT
SUT VIC AB 3-0 SH 27X BRD (SUTURE) ×2 IMPLANT
TRAY FOLEY MTR SLVR 16FR STAT (SET/KITS/TRAYS/PACK) ×1 IMPLANT
TUBE SUCTION HIGH CAP CLEAR NV (SUCTIONS) ×1 IMPLANT
WATER STERILE IRR 1000ML POUR (IV SOLUTION) ×2 IMPLANT
WRAP KNEE MAXI GEL POST OP (GAUZE/BANDAGES/DRESSINGS) ×1 IMPLANT

## 2024-03-22 NOTE — Interval H&P Note (Signed)
 History and Physical Interval Note:  03/22/2024 7:12 AM  Debbie Hayes  has presented today for surgery, with the diagnosis of Primary osteoarthritis of left knee.  The various methods of treatment have been discussed with the patient and family. After consideration of risks, benefits and other options for treatment, the patient has consented to  Procedure(s): ARTHROPLASTY, KNEE, TOTAL (Left) as a surgical intervention.  The patient's history has been reviewed, patient examined, no change in status, stable for surgery.  I have reviewed the patient's chart and labs.  Questions were answered to the patient's satisfaction.     Fonda SHAUNNA Olmsted

## 2024-03-22 NOTE — Discharge Instructions (Signed)
 INSTRUCTIONS AFTER JOINT REPLACEMENT   Remove items at home which could result in a fall. This includes throw rugs or furniture in walking pathways ICE to the affected joint every three hours while awake for 30 minutes at a time, for at least the first 3-5 days, and then as needed for pain and swelling.  Continue to use ice for pain and swelling. You may notice swelling that will progress down to the foot and ankle.  This is normal after surgery.  Elevate your leg when you are not up walking on it.   Continue to use the breathing machine you got in the hospital (incentive spirometer) which will help keep your temperature down.  It is common for your temperature to cycle up and down following surgery, especially at night when you are not up moving around and exerting yourself.  The breathing machine keeps your lungs expanded and your temperature down.   DIET:  As you were doing prior to hospitalization, we recommend a well-balanced diet.  DRESSING / WOUND CARE / SHOWERING  You may shower 3 days after surgery, but keep the wounds dry during showering.  You may use an occlusive plastic wrap (Press'n Seal for example), NO SOAKING/SUBMERGING IN THE BATHTUB.  If the bandage gets wet, change with a clean dry gauze.  If the incision gets wet, pat the wound dry with a clean towel. You have an ace wrap around your operative leg with two white pads underneath. Please remove these 24 hours after surgery. Once the ace wrap has been removed, please wear your compression stockings on both legs during the day, you may remove these each night.   ACTIVITY  Increase activity slowly as tolerated, but follow the weight bearing instructions below.   No driving for 6 weeks or until further direction given by your physician.  You cannot drive while taking narcotics.  No lifting or carrying greater than 10 lbs. until further directed by your surgeon. Avoid periods of inactivity such as sitting longer than an hour when not  asleep. This helps prevent blood clots.  You may return to work once you are authorized by your doctor.     WEIGHT BEARING   Weight bearing as tolerated with assist device (walker, cane, etc) as directed, use it as long as suggested by your surgeon or therapist, typically at least 4-6 weeks.   EXERCISES  Results after joint replacement surgery are often greatly improved when you follow the exercise, range of motion and muscle strengthening exercises prescribed by your doctor. Safety measures are also important to protect the joint from further injury. Any time any of these exercises cause you to have increased pain or swelling, decrease what you are doing until you are comfortable again and then slowly increase them. If you have problems or questions, call your caregiver or physical therapist for advice.   Rehabilitation is important following a joint replacement. After just a few days of immobilization, the muscles of the leg can become weakened and shrink (atrophy).  These exercises are designed to build up the tone and strength of the thigh and leg muscles and to improve motion. Often times heat used for twenty to thirty minutes before working out will loosen up your tissues and help with improving the range of motion but do not use heat for the first two weeks following surgery (sometimes heat can increase post-operative swelling).   These exercises can be done on a training (exercise) mat, on the floor, on a table or  on a bed. Use whatever works the best and is most comfortable for you.    Use music or television while you are exercising so that the exercises are a pleasant break in your day. This will make your life better with the exercises acting as a break in your routine that you can look forward to.   Perform all exercises about fifteen times, three times per day or as directed.  You should exercise both the operative leg and the other leg as well.  Exercises include:   Quad Sets -  Tighten up the muscle on the front of the thigh (Quad) and hold for 5-10 seconds.   Straight Leg Raises - With your knee straight (if you were given a brace, keep it on), lift the leg to 60 degrees, hold for 3 seconds, and slowly lower the leg.  Perform this exercise against resistance later as your leg gets stronger.  Leg Slides: Lying on your back, slowly slide your foot toward your buttocks, bending your knee up off the floor (only go as far as is comfortable). Then slowly slide your foot back down until your leg is flat on the floor again.  Angel Wings: Lying on your back spread your legs to the side as far apart as you can without causing discomfort.  Hamstring Strength:  Lying on your back, push your heel against the floor with your leg straight by tightening up the muscles of your buttocks.  Repeat, but this time bend your knee to a comfortable angle, and push your heel against the floor.  You may put a pillow under the heel to make it more comfortable if necessary.   A rehabilitation program following joint replacement surgery can speed recovery and prevent re-injury in the future due to weakened muscles. Contact your doctor or a physical therapist for more information on knee rehabilitation.    CONSTIPATION  Constipation is defined medically as fewer than three stools per week and severe constipation as less than one stool per week.  Even if you have a regular bowel pattern at home, your normal regimen is likely to be disrupted due to multiple reasons following surgery.  Combination of anesthesia, postoperative narcotics, change in appetite and fluid intake all can affect your bowels.   YOU MUST use at least one of the following options; they are listed in order of increasing strength to get the job done.  They are all available over the counter, and you may need to use some, POSSIBLY even all of these options:    Drink plenty of fluids (prune juice may be helpful) and high fiber  foods Colace 100 mg by mouth twice a day  Senokot for constipation as directed and as needed Dulcolax (bisacodyl ), take with full glass of water   Miralax (polyethylene glycol) once or twice a day as needed.  If you have tried all these things and are unable to have a bowel movement in the first 3-4 days after surgery call either your surgeon or your primary doctor.    If you experience loose stools or diarrhea, hold the medications until you stool forms back up.  If your symptoms do not get better within 1 week or if they get worse, check with your doctor.  If you experience the worst abdominal pain ever or develop nausea or vomiting, please contact the office immediately for further recommendations for treatment.   ITCHING:  If you experience itching with your medications, try taking only a single pain pill, or  even half a pain pill at a time.  You can also use Benadryl  over the counter for itching or also to help with sleep.   TED HOSE STOCKINGS:  Use stockings on both legs until for at least 2 weeks or as directed by physician office. They may be removed at night for sleeping.  MEDICATIONS:  See your medication summary on the "After Visit Summary" that nursing will review with you.  You may have some home medications which will be placed on hold until you complete the course of blood thinner medication.  It is important for you to complete the blood thinner medication as prescribed.  PRECAUTIONS:  If you experience chest pain or shortness of breath - call 911 immediately for transfer to the hospital emergency department.   If you develop a fever greater that 101 F, purulent drainage from wound, increased redness or drainage from wound, foul odor from the wound/dressing, or calf pain - CONTACT YOUR SURGEON.                                                   FOLLOW-UP APPOINTMENTS:  If you do not already have a post-op appointment, please call the office for an appointment to be seen by your  surgeon.  Guidelines for how soon to be seen are listed in your "After Visit Summary", but are typically between 1-4 weeks after surgery.  OTHER INSTRUCTIONS:   Knee Replacement:  Do not place pillow under knee, focus on keeping the knee straight while resting.    POST-OPERATIVE OPIOID TAPER INSTRUCTIONS: It is important to wean off of your opioid medication as soon as possible. If you do not need pain medication after your surgery it is ok to stop day one. Opioids include: Codeine, Hydrocodone (Norco, Vicodin), Oxycodone (Percocet, oxycontin ) and hydromorphone  amongst others.  Long term and even short term use of opiods can cause: Increased pain response Dependence Constipation Depression Respiratory depression And more.  Withdrawal symptoms can include Flu like symptoms Nausea, vomiting And more Techniques to manage these symptoms Hydrate well Eat regular healthy meals Stay active Use relaxation techniques(deep breathing, meditating, yoga) Do Not substitute Alcohol to help with tapering If you have been on opioids for less than two weeks and do not have pain than it is ok to stop all together.  Plan to wean off of opioids This plan should start within one week post op of your joint replacement. Maintain the same interval or time between taking each dose and first decrease the dose.  Cut the total daily intake of opioids by one tablet each day Next start to increase the time between doses. The last dose that should be eliminated is the evening dose.   MAKE SURE YOU:  Understand these instructions.  Get help right away if you are not doing well or get worse.    Thank you for letting us  be a part of your medical care team.  It is a privilege we respect greatly.  We hope these instructions will help you stay on track for a fast and full recovery!

## 2024-03-22 NOTE — Op Note (Signed)
 DATE OF SURGERY:  03/22/2024 TIME: 9:42 AM  PATIENT NAME:  Debbie Hayes   AGE: 73 y.o.    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  OPERATIVE IMPLANTS: Implant Name: CEMENT HV SMART SET - [id number] Type: Cement Inv. Item: CEMENT HV SMART SET Serial No.:  Manufacturer: [employer] Lot No.: [id number] LRB: Left No. Used: 1 Action: Implanted   Implant Name: CEMENT HV SMART SET - [id number] Type: Cement Inv. Item: CEMENT HV SMART SET Serial No.:  Manufacturer: [employer] Lot No.: [id number] LRB: Left No. Used: 1 Action: Implanted   Implant Name: PATELLA MEDIAL ATTUN KNEE - [id number] Type: Knees Inv. Item: PATELLA MEDIAL ATTUN KNEE Serial No.:  Manufacturer: [employer] Lot No.: [id number] LRB: Left No. Used: 1 Action: Implanted   Implant Name: BASEPLATE TIB CMT FB PCKT SZ5 - [id number] Type: Knees Inv. Item: BASEPLATE TIB CMT FB PCKT SZ5 Serial No.:  Manufacturer: [employer] Lot No.: [id number] LRB: Left No. Used: 1 Action: Implanted   Implant Name: ATTUNE PS FEM LT SZ 5 CEM KNEE - [id number] Type: Femur Inv. Item: ATTUNE PS FEM LT SZ 5 CEM KNEE Serial No.:  Manufacturer: [employer] Lot No.: [id number] LRB: Left No. Used: 1 Action: Implanted   Implant Name: INSERT TIB ATTUNE FB [id number] - [id number] Type: Insert Inv. Item: INSERT TIB ATTUNE FB [id number] Serial No.:  Manufacturer: [employer] Lot No.: [id number] LRB: Left No. Used: 1 Action: Implanted   PREOPERATIVE INDICATIONS:  Debbie Hayes is a 73 y.o. year old female with end stage bone on bone  degenerative arthritis of the knee who failed conservative treatment, including injections, antiinflammatories, activity modification, and assistive devices, and had significant impairment of their activities of daily living, and elected for Total Knee Arthroplasty.  She had significant subchondral edema throughout the knee, and meniscal extrusion, and elected for total knee replacement.  On her contralateral side she had difficulty recovering, and ended up needing a manipulation under anesthesia.  The risks, benefits, and alternatives were discussed at length including but not limited to the risks of infection, bleeding, nerve injury, stiffness, blood clots, the need for revision surgery, cardiopulmonary complications, among others, and they were willing to proceed.  OPERATIVE FINDINGS AND UNIQUE ASPECTS OF THE CASE: She had advanced chondral losses in the medial compartment on the chondral surfaces, that was at least grade 3 if not diffuse areas of grade 4.  The undersurface of the patella had grade 3 changes.  The subchondral bone had a cheesy appearance, and was crumbling, and was fairly poor quality.  Because the chondral surfaces were still intact, I cut the femur on a 10, and the tibia on a 3 referencing off of the medial side.  I debated whether the slope looked to be too much, but in the end it seemed appropriate.  On the femoral 4-in-1 cut guide, I shifted it 1 notch down in order to preserve my flexion stability, because it looked like a fairly deep posterior cut.  The distal femur looked like a 5, I am not sure if a 6 would have been too large.  The proximal aspect of the component looked like it could have been a 6, but  the distal aspect the 5 seemed to fit best.  I was just flush with the anterior cortex of the femur, and had to smooth and the anterior cut with a saw, it was barely a notch if any.  The patella was between a size 35 and a 38, but I went with the 35 and had appropriate coverage.   Her tissue flexibility was fairly flexible, and I used a size 12 and had easy full extension with the polyethylene.  She was appropriately stable in flexion.  I was ultimately very happy with the reconstruction and had a no thumb patellar tracking technique.  ESTIMATED BLOOD LOSS: 150 mL  OPERATIVE DESCRIPTION:  The patient was brought to the operative room and placed in a supine position.  Anesthesia was administered.  IV antibiotics were given.  The lower extremity was prepped and draped in the usual sterile fashion.  Time out was performed.  The leg was elevated and exsanguinated and the tourniquet was inflated.  Anterior quadriceps tendon splitting approach was performed.  The patella was everted and osteophytes were removed.  The anterior horn of the medial and lateral meniscus was removed.   The patella was then measured, and cut with the saw.  The thickness before the cut was 21 and after the cut was 14.  A metal shield was used to protect the patella throughout the case.    The distal femur was opened with the drill and the intramedullary distal femoral cutting jig was utilized, set at 5 degrees resecting 10 mm off the distal femur.  Care was taken to protect the collateral ligaments.  Then the extramedullary tibial cutting jig was utilized making the appropriate cut using the anterior tibial crest as a reference building in appropriate posterior slope.  Care was taken during the cut to protect the medial and collateral ligaments.  The proximal tibia was removed along with the posterior horns of the menisci.  The PCL was sacrificed.    The extensor gap was measured and found to have adequate resection, measuring to a size 5.    The distal femoral sizing jig was applied, taking care to avoid notching.  This was set at 3 degrees of external rotation.  Then the 4-in-1 cutting jig was applied and the anterior and posterior femur was cut, along with the chamfer cuts.  All posterior osteophytes  were removed.  The flexion gap was then measured and was symmetric with the extension gap.  I completed the distal femoral preparation using the appropriate jig to prepare the box.  The proximal tibia sized and prepared accordingly with the reamer and the punch, and then all components were trialed with the poly insert.  The knee was found to have excellent balance and full motion.    The above named components were then cemented into place and all excess cement was removed.  The real polyethylene implant was placed.  After the cement had cured I released the tourniquet and confirmed excellent hemostasis with no major posterior vessel injury.    The knee was easily taken through a range of motion and the patella tracked well and the knee irrigated copiously and the parapatellar tissue closed with Stratafix and vicryl, and subcutaneous tissue closed with vicryl, and monocryl with steri strips for the skin.  The wounds were injected with marcaine , and dressed with sterile gauze and the patient was awakened and returned to the PACU in stable and satisfactory condition.  There were no complications.  Total tourniquet time  was about 75 minutes.

## 2024-03-22 NOTE — Anesthesia Procedure Notes (Signed)
 Procedure Name: MAC Date/Time: 03/22/2024 7:46 AM  Performed by: Landy Chip HERO, CRNAOxygen Delivery Method: Simple face mask Placement Confirmation: positive ETCO2 Dental Injury: Teeth and Oropharynx as per pre-operative assessment

## 2024-03-22 NOTE — Anesthesia Postprocedure Evaluation (Signed)
 Anesthesia Post Note  Patient: Debbie Hayes  Procedure(s) Performed: ARTHROPLASTY, KNEE, TOTAL (Left: Knee)     Patient location during evaluation: PACU Anesthesia Type: Spinal Level of consciousness: awake and alert Pain management: pain level controlled Vital Signs Assessment: post-procedure vital signs reviewed and stable Respiratory status: spontaneous breathing, nonlabored ventilation and respiratory function stable Cardiovascular status: blood pressure returned to baseline and stable Postop Assessment: no apparent nausea or vomiting Anesthetic complications: no   No notable events documented.  Last Vitals:  Vitals:   03/22/24 1100 03/22/24 1109  BP: (!) 146/79   Pulse: 65   Resp: 17   Temp: 36.6 C (!) 36.1 C  SpO2: 96%     Last Pain:  Vitals:   03/22/24 1114  TempSrc:   PainSc: 5                  Butler Levander Pinal

## 2024-03-22 NOTE — Transfer of Care (Signed)
 Immediate Anesthesia Transfer of Care Note  Patient: Debbie Hayes  Procedure(s) Performed: ARTHROPLASTY, KNEE, TOTAL (Left: Knee)  Patient Location: PACU  Anesthesia Type:Spinal  Level of Consciousness: awake, alert , and patient cooperative  Airway & Oxygen Therapy: Patient Spontanous Breathing and Patient connected to face mask oxygen  Post-op Assessment: Report given to RN and Post -op Vital signs reviewed and stable  Post vital signs: Reviewed and stable  Last Vitals:  Vitals Value Taken Time  BP 128/81 03/22/24 10:01  Temp    Pulse 79 03/22/24 10:02  Resp 25 03/22/24 10:02  SpO2 93 % 03/22/24 10:02  Vitals shown include unfiled device data.  Last Pain:  Vitals:   03/22/24 0633  TempSrc: Oral  PainSc:          Complications: No notable events documented.

## 2024-03-22 NOTE — Evaluation (Signed)
 Physical Therapy Evaluation Patient Details Name: Debbie Hayes MRN: [id number] DOB: 02-02-1951 Today's Date: 03/22/2024  History of Present Illness  73 yo female presents to therapy s/p L TKA on 03/22/2024 due to failure of conservative measures and an element of avascular necrosis and subchondral edema. Pt PMH includes but is not limited to: asthma, thyroid  ca s/p thyroidectomy and radiation, migraine, chronic pain, DM, CAD, vertigo, fibromyalgia, GERD, HTN, OSA, back surgery, L foot surgeries, B CTR, B elbow surgeries, R TKA, and B thumb joint replacements.  Clinical Impression   [name] is a 73 y.o. female POD 0 s/p L TKA. Patient reports mod I with mobility at baseline. Patient is now limited by functional impairments (see PT problem list below) and requires S for bed mobility and min A for transfers. Patient was unable to safely ambulate at time of eval due to slow regression of anesthesia impacting B LE kinesthetic awareness and motor control.  Patient instructed in exercise to facilitate ROM and circulation to manage edema with supine LE TE. Patient will benefit from continued skilled PT interventions to address impairments and progress towards PLOF. Acute PT will follow to progress mobility and stair training in preparation for safe discharge home with family support and OPPT services.       If plan is discharge home, recommend the following: A little help with walking and/or transfers;A little help with bathing/dressing/bathroom;Assistance with cooking/housework;Help with stairs or ramp for entrance;Assist for transportation   Can travel by private vehicle        Equipment Recommendations None recommended by PT  Recommendations for Other Services       Functional Status Assessment Patient has had a recent decline in their functional status and demonstrates the ability to make significant improvements in function in a reasonable and predictable amount of time.      Precautions / Restrictions Precautions Precautions: Knee;Fall Restrictions Weight Bearing Restrictions Per Provider Order: No      Mobility  Bed Mobility Overal bed mobility: Needs Assistance Bed Mobility: Supine to Sit     Supine to sit: Supervision     General bed mobility comments: min cues    Transfers Overall transfer level: Needs assistance Equipment used: Rolling walker (2 wheels) Transfers: Sit to/from Stand, Bed to chair/wheelchair/BSC Sit to Stand: Min assist Stand pivot transfers: Min assist         General transfer comment: once in standing pt reported and PT noted difficulty with B LE placement R > L and instabiltiy in ankle with poor proprioception and narrow BOS with initial sit to stand from EOB, PT and pt deemed unsafe at this tme to progress with gait tasks pt was able to perform SPT from bed to recliner with heavy reliance on B UE support at RW, cues and increased time with ongoing poor kinethetic awareness and LE advacement/placement    Ambulation/Gait               General Gait Details: NT  Stairs            Wheelchair Mobility     Tilt Bed    Modified Rankin (Stroke Patients Only)       Balance                                             Pertinent Vitals/Pain Pain Assessment Pain Assessment: 0-10 Pain  Score: 6  Pain Location: L knee and LE Pain Descriptors / Indicators: Aching, Constant, Discomfort, Grimacing, Operative site guarding Pain Intervention(s): Limited activity within patient's tolerance, Monitored during session, Premedicated before session, Repositioned, Ice applied    Home Living Family/patient expects to be discharged to:: Private residence Living Arrangements: Spouse/significant other Available Help at Discharge: Family Type of Home: House Home Access: Stairs to enter Entrance Stairs-Rails: Right Entrance Stairs-Number of Steps: 2   Home Layout: Two level;Able to live on main  level with bedroom/bathroom Home Equipment: Rolling Walker (2 wheels);Crutches;Cane - single point;Transport chair;Shower seat      Prior Function Prior Level of Function : Independent/Modified Independent             Mobility Comments: SPC priamry means of mobility also used crutches and RW, mod I for all ADLs, and self care tasks       Extremity/Trunk Assessment        Lower Extremity Assessment Lower Extremity Assessment: LLE deficits/detail LLE Deficits / Details: ankle DF/PF 5/5; SLR < 10 degree lag LLE Sensation: decreased proprioception (once in standing)    Cervical / Trunk Assessment Cervical / Trunk Assessment: Normal  Communication   Communication Communication: No apparent difficulties    Cognition Arousal: Alert Behavior During Therapy: WFL for tasks assessed/performed   PT - Cognitive impairments: No apparent impairments                         Following commands: Intact       Cueing       General Comments      Exercises Total Joint Exercises Ankle Circles/Pumps: AROM, Both, 10 reps Quad Sets: AROM, Left, 5 reps Short Arc Quad: AROM, Left, 5 reps Heel Slides: AROM, Left, 5 reps Hip ABduction/ADduction: AROM, Left, 5 reps Straight Leg Raises: AROM, Right, 5 reps   Assessment/Plan    PT Assessment Patient needs continued PT services  PT Problem List Decreased strength;Decreased range of motion;Decreased activity tolerance;Decreased balance;Decreased mobility;Decreased coordination;Pain       PT Treatment Interventions DME instruction;Gait training;Stair training;Functional mobility training;Therapeutic activities;Therapeutic exercise;Balance training;Neuromuscular re-education;Patient/family education;Modalities    PT Goals (Current goals can be found in the Care Plan section)  Acute Rehab PT Goals Patient Stated Goal: to be able to walk no pain or AD, quilt, go shopping and get back to life PT Goal Formulation: With  patient Time For Goal Achievement: 04/05/24 Potential to Achieve Goals: Good    Frequency 7X/week     Co-evaluation               AM-PAC PT 6 Clicks Mobility  Outcome Measure Help needed turning from your back to your side while in a flat bed without using bedrails?: None Help needed moving from lying on your back to sitting on the side of a flat bed without using bedrails?: A Little Help needed moving to and from a bed to a chair (including a wheelchair)?: A Lot Help needed standing up from a chair using your arms (e.g., wheelchair or bedside chair)?: A Lot Help needed to walk in hospital room?: Total Help needed climbing 3-5 steps with a railing? : Total 6 Click Score: 13    End of Session Equipment Utilized During Treatment: Gait belt Activity Tolerance: Patient limited by pain;Other (comment) (slow regression of anethesia impacting B LE motor control and coordination) Patient left: in chair;with call bell/phone within reach;with family/visitor present Nurse Communication: Mobility status;Other (comment) (pt not appropriate at  this time for same day d/c) PT Visit Diagnosis: Unsteadiness on feet (R26.81);Other abnormalities of gait and mobility (R26.89);Muscle weakness (generalized) (M62.81);Difficulty in walking, not elsewhere classified (R26.2);Pain Pain - Right/Left: Left Pain - part of body: Knee;Leg    Time: 8741-8658 PT Time Calculation (min) (ACUTE ONLY): 43 min   Charges:   PT Evaluation $PT Eval Low Complexity: 1 Low PT Treatments $Therapeutic Exercise: 8-22 mins $Therapeutic Activity: 8-22 mins PT General Charges $$ ACUTE PT VISIT: 1 Visit         [place], PT Acute Rehab   [place] VEAR Drone 03/22/2024, 2:28 PM

## 2024-03-22 NOTE — Anesthesia Procedure Notes (Signed)
 Procedure Name: MAC Date/Time: 03/22/2024 7:26 AM  Performed by: Landy Chip HERO, CRNAPre-anesthesia Checklist: Patient identified, Emergency Drugs available, Suction available, Patient being monitored and Timeout performed Patient Re-evaluated:Patient Re-evaluated prior to induction Oxygen Delivery Method: Nasal cannula Preoxygenation: Pre-oxygenation with 100% oxygen Induction Type: IV induction Placement Confirmation: positive ETCO2 Dental Injury: Teeth and Oropharynx as per pre-operative assessment

## 2024-03-22 NOTE — Progress Notes (Signed)
 Physical Therapy Treatment Patient Details Name: Debbie Hayes MRN: [id number] DOB: 05/01/1951 Today's Date: 03/22/2024   History of Present Illness 73 yo female presents to therapy s/p L TKA on 03/22/2024 due to failure of conservative measures and an element of avascular necrosis and subchondral edema. Pt PMH includes but is not limited to: asthma, thyroid  ca s/p thyroidectomy and radiation, migraine, chronic pain, DM, CAD, vertigo, fibromyalgia, GERD, HTN, OSA, back surgery, L foot surgeries, B CTR, B elbow surgeries, R TKA, and B thumb joint replacements.    PT Comments   Debbie Hayes is a 73 y.o. female POD 0 s/p L TKA. Patient reports mod I with mobility at baseline. Patient is now limited by functional impairments (see PT problem list below) and requires CGA and cues for transfers and gait with RW. Patient was able to ambulate 55 and 40 feet with RW and CGA and cues for safe walker management. Patient educated on safe sequencing for stair mobility with R handrail, fall risk prevention, pain management and goal, use of CP/ice and car transfers pt and spouse verbalized understanding of  safe guarding position for people assisting with mobility. Patient instructed in exercises to facilitate ROM and circulation reviewed and HO provided. Patient will benefit from continued skilled PT interventions to address impairments and progress towards PLOF. Patient has met mobility goals at adequate level for discharge home with family assist and OPPT services schedule for 9/3; will continue to follow if pt continues acute stay to progress towards Mod I goals.    If plan is discharge home, recommend the following: A little help with walking and/or transfers;A little help with bathing/dressing/bathroom;Assistance with cooking/housework;Help with stairs or ramp for entrance;Assist for transportation   Can travel by private vehicle        Equipment Recommendations  None recommended by PT     Recommendations for Other Services       Precautions / Restrictions Precautions Precautions: Knee;Fall Restrictions Weight Bearing Restrictions Per Provider Order: No     Mobility  Bed Mobility Overal bed mobility: Needs Assistance Bed Mobility: Supine to Sit     Supine to sit: Supervision     General bed mobility comments: pt seated in recliner when PT returned    Transfers Overall transfer level: Needs assistance Equipment used: Rolling walker (2 wheels) Transfers: Sit to/from Stand Sit to Stand: Contact guard assist Stand pivot transfers: Min assist         General transfer comment: pt exhibited improved B LE motor controll and coordination with sit to stand reporting appropriate sensation and proprioception had returned enabling PT to progress with transfer and gait tasks . pt  requried min cues for safety, proper UE and AD placement with recliner and commode transfers pt strongly encouraged to place RW infront of pt to complete transfer to sitting surface vs pushing aside    Ambulation/Gait Ambulation/Gait assistance: Contact guard assist Gait Distance (Feet): 55 Feet Assistive device: Rolling walker (2 wheels) Gait Pattern/deviations: Step-to pattern, Antalgic, Decreased stance time - left, Trunk flexed Gait velocity: decreased     General Gait Details: slight trunk flexion with B UE support at RW to offload L  LE in stance phace, cues for safety, posture and RW management   Stairs Stairs: Yes Stairs assistance: Contact guard assist Stair Management: Two rails, One rail Right Number of Stairs: 2 General stair comments: step navigation instruction with B handrail and cues for safety, sequencing and step to technique, pt able to progress to  use of R handrail only to emulate home setting with min cues and HHA   Wheelchair Mobility     Tilt Bed    Modified Rankin (Stroke Patients Only)       Balance                                             Communication Communication Communication: No apparent difficulties  Cognition Arousal: Alert Behavior During Therapy: WFL for tasks assessed/performed   PT - Cognitive impairments: No apparent impairments                         Following commands: Intact      Cueing    Exercises Total Joint Exercises Ankle Circles/Pumps: AROM, Both, 10 reps Quad Sets: AROM, Left, 5 reps Short Arc Quad: AROM, Left, 5 reps Heel Slides: AROM, Left, 5 reps Hip ABduction/ADduction: AROM, Left, 5 reps Straight Leg Raises: AROM, Right, 5 reps Knee Flexion: AROM, Right, 10 reps, Seated    General Comments        Pertinent Vitals/Pain Pain Assessment Pain Assessment: 0-10 Pain Score: 8  Pain Location: L knee and LE Pain Descriptors / Indicators: Aching, Constant, Discomfort, Grimacing, Operative site guarding Pain Intervention(s): Limited activity within patient's tolerance, Monitored during session, Premedicated before session, Repositioned, Ice applied    Home Living                          Prior Function            PT Goals (current goals can now be found in the care plan section) Acute Rehab PT Goals Patient Stated Goal: to be able to walk no pain or AD, quilt, go shopping and get back to life PT Goal Formulation: With patient Time For Goal Achievement: 04/05/24 Potential to Achieve Goals: Good Progress towards PT goals: Progressing toward goals    Frequency    7X/week      PT Plan      Co-evaluation              AM-PAC PT 6 Clicks Mobility   Outcome Measure  Help needed turning from your back to your side while in a flat bed without using bedrails?: None Help needed moving from lying on your back to sitting on the side of a flat bed without using bedrails?: A Little Help needed moving to and from a bed to a chair (including a wheelchair)?: A Little Help needed standing up from a chair using your arms (e.g., wheelchair or  bedside chair)?: A Little Help needed to walk in hospital room?: A Little Help needed climbing 3-5 steps with a railing? : A Little 6 Click Score: 19    End of Session Equipment Utilized During Treatment: Gait belt Activity Tolerance: Patient tolerated treatment well Patient left: in chair;with call bell/phone within reach;with family/visitor present Nurse Communication: Mobility status;Other (comment) (pt readiness for same day d/c from PT standpoint) PT Visit Diagnosis: Unsteadiness on feet (R26.81);Other abnormalities of gait and mobility (R26.89);Muscle weakness (generalized) (M62.81);Difficulty in walking, not elsewhere classified (R26.2);Pain Pain - Right/Left: Left Pain - part of body: Knee;Leg     Time: 8486-8461 PT Time Calculation (min) (ACUTE ONLY): 25 min  Charges:    $Gait Training: 8-22 mins $Therapeutic Activity: 8-22 mins PT General Charges $$ ACUTE  PT VISIT: 1 Visit                     [place], PT Acute Rehab    [place] VEAR Drone 03/22/2024, 5:45 PM

## 2024-03-22 NOTE — Anesthesia Procedure Notes (Signed)
 Anesthesia Regional Block: Adductor canal block   Pre-Anesthetic Checklist: , timeout performed,  Correct Patient, Correct Site, Correct Laterality,  Correct Procedure, Correct Position, site marked,  Risks and benefits discussed,  Surgical consent,  Pre-op evaluation,  At surgeon's request and post-op pain management  Laterality: Left  Prep: chloraprep       Needles:  Injection technique: Single-shot  Needle Type: Stimiplex     Needle Length: 9cm  Needle Gauge: 21     Additional Needles:   Procedures:,,,, ultrasound used (permanent image in chart),,    Narrative:  Start time: 03/22/2024 7:07 AM End time: 03/22/2024 7:12 AM Injection made incrementally with aspirations every 5 mL.  Performed by: Personally  Anesthesiologist: Cleotilde Butler Dade, MD

## 2024-03-22 NOTE — Anesthesia Procedure Notes (Signed)
 Spinal  Patient location during procedure: OR Start time: 03/22/2024 7:31 AM End time: 03/22/2024 7:36 AM Reason for block: surgical anesthesia Staffing Performed: anesthesiologist  Anesthesiologist: Cleotilde Butler Dade, MD Performed by: Cleotilde Butler Dade, MD Authorized by: Cleotilde Butler Dade, MD   Preanesthetic Checklist Completed: patient identified, IV checked, site marked, risks and benefits discussed, surgical consent, monitors and equipment checked, pre-op evaluation and timeout performed Spinal Block Patient position: sitting Prep: DuraPrep Patient monitoring: heart rate, cardiac monitor, continuous pulse ox and blood pressure Approach: midline Location: L3-4 Injection technique: single-shot Needle Needle type: Sprotte  Needle gauge: 24 G Needle length: 9 cm Assessment Sensory level: T4 Events: CSF return

## 2024-03-23 ENCOUNTER — Encounter (HOSPITAL_COMMUNITY): Payer: Self-pay | Admitting: Orthopedic Surgery

## 2024-03-24 ENCOUNTER — Ambulatory Visit: Admitting: Cardiology

## 2024-03-25 ENCOUNTER — Telehealth: Payer: Self-pay | Admitting: Neurology

## 2024-03-25 ENCOUNTER — Telehealth: Payer: Self-pay | Admitting: Family Medicine

## 2024-03-25 NOTE — Telephone Encounter (Signed)
 Copied from CRM [id number]. Topic: Clinical - Medication Prior Auth >> Mar 25, 2024  2:53 PM Robinson H wrote: Reason for CRM: Randie Junk following up to see if office received prior authorization for Repatha  140mg    [employer] [phone]

## 2024-03-25 NOTE — Telephone Encounter (Unsigned)
 Copied from CRM [id number]. Topic: General - Other >> Mar 25, 2024  1:53 PM Gennette ORN wrote: Reason for CRM: Chyrl OLIPHANT [facility] ext [phone] confirming her phone number and additional questions about medications.

## 2024-03-29 ENCOUNTER — Telehealth: Payer: Self-pay

## 2024-03-29 ENCOUNTER — Other Ambulatory Visit (HOSPITAL_COMMUNITY): Payer: Self-pay

## 2024-03-29 ENCOUNTER — Encounter: Payer: Self-pay | Admitting: Sports Medicine

## 2024-03-29 NOTE — Telephone Encounter (Signed)
 Pharmacy Patient Advocate Encounter  Received notification from [facility] that Prior Authorization for Repatha  SureClick 140mg /ml has been APPROVED from 03/29/24 to 03/29/25   PA #/Case ID/Reference #: [id number]

## 2024-03-29 NOTE — Telephone Encounter (Signed)
 Copied from CRM #[id number]. Topic: General - Other >> Mar 29, 2024 11:19 AM Jasmin G wrote: Reason for CRM: Ms. Haywood from [facility] called to let clinic know that she will fax prior authorization for pt, call back number if needed at [phone] opt 5.

## 2024-03-29 NOTE — Telephone Encounter (Signed)
 Pharmacy Patient Advocate Encounter   Received notification from Pt Calls Messages that prior authorization for Repatha  SureClick 140mg /ml is required/requested.   Insurance verification completed.   The patient is insured through [facility] .   Per test claim: PA required; PA submitted to above mentioned insurance via Latent Key/confirmation #/EOC [id number] Status is pending

## 2024-03-30 ENCOUNTER — Other Ambulatory Visit: Payer: Self-pay

## 2024-03-30 ENCOUNTER — Ambulatory Visit: Attending: Orthopedic Surgery | Admitting: Physical Therapy

## 2024-03-30 ENCOUNTER — Encounter: Payer: Self-pay | Admitting: Physical Therapy

## 2024-03-30 DIAGNOSIS — R262 Difficulty in walking, not elsewhere classified: Secondary | ICD-10-CM | POA: Diagnosis present

## 2024-03-30 DIAGNOSIS — R600 Localized edema: Secondary | ICD-10-CM | POA: Insufficient documentation

## 2024-03-30 DIAGNOSIS — M25562 Pain in left knee: Secondary | ICD-10-CM | POA: Insufficient documentation

## 2024-03-30 DIAGNOSIS — M25662 Stiffness of left knee, not elsewhere classified: Secondary | ICD-10-CM | POA: Diagnosis present

## 2024-03-30 NOTE — Therapy (Signed)
 OUTPATIENT PHYSICAL THERAPY LOWER EXTREMITY EVALUATION   Patient Name: Debbie Hayes MRN: [id number] DOB:05-27-1951, 73 y.o., female Today's Date: 03/30/2024  END OF SESSION:  PT End of Session - 03/30/24 0803     Visit Number 1    Date for PT Re-Evaluation 06/29/24    Authorization Type BCBS medicare    PT Start Time 0802    PT Stop Time 0855    PT Time Calculation (min) 53 min    Activity Tolerance Patient tolerated treatment well    Behavior During Therapy WFL for tasks assessed/performed          Past Medical History:  Diagnosis Date   Allergy    Asthma    Cancer (HCC)    thyroid  cancer   Cataract    Chronic migraine BOTOX INJECTION EVERY 3 MONTHS   Chronic pain in right shoulder    Constipation    Coronary artery disease    Diabetes mellitus    Disorder of inner ear CAUSES VERTIGO OCCASIONALLY   Fibromyalgia    GERD (gastroesophageal reflux disease)    Hemorrhoid    History of bladder infections    History of thyroid  cancer 2009  S/P TOTAL THYROIDECTOMY AND RADIATION   Hyperlipidemia    Hypertension CARDIOLOGIST- DR BLANCA- WILL REQUEST LATE NOTE   DENIES S & S   Hypothyroidism    Insomnia    Left shoulder pain    Macular degeneration    MRSA infection    nasal treated more than 15 years ago   Normal nuclear stress test 01/25/2009   OA (osteoarthritis) JOINTS   Osteopenia    Osteoporosis    Painful orthopaedic hardware (HCC)    left foot   Pneumonia    PONV (postoperative nausea and vomiting) SEVERE   Rotator cuff disorder LEFT SHOULDER RTC IMPINGMENT   Sleep apnea    mouth guard, no cpap    Sleep apnea in adult 06/26/2017   Spondylolisthesis of lumbar region    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   Wears glasses    Past Surgical History:  Procedure Laterality Date   BACK SURGERY     Dr Mavis- spondylosis   BILATERAL CARPAL TUNNEL RELEASE  1994   BILATERAL ELBOW SURG.  1999   BILATERAL SALPINGOOPHECTOMY  1993   POST-OP URETER  REPAIR 12 DAYS AFTER    CATARACT EXTRACTION, BILATERAL     COLONOSCOPY     EYE SURGERY     FOOT ARTHRODESIS Left 07/10/2016   Procedure: LEFT 2ND TARSAL METATARSAL ARTHRODESIS  GASTROC RECESSION LEFT LAPIDUS MODIFIED MCBRIDE BUIONECTOMY;  Surgeon: Norleen Armor, MD;  Location: [facility];  Service: Orthopedics;  Laterality: Left;   GASTROC RECESSION EXTREMITY Left 07/10/2016   Procedure: GASTROC RECESSION EXTREMITY;  Surgeon: Norleen Armor, MD;  Location: [facility];  Service: Orthopedics;  Laterality: Left;   HARDWARE REMOVAL Left 05/20/2018   Procedure: Left foot removal of deep implants;  Surgeon: Armor Norleen, MD;  Location: [facility];  Service: Orthopedics;  Laterality: Left;    LEFT THUMB JOINT REPLACEMENT  2005   RIGHT DONE IN 2004   OOPHORECTOMY     POLYPECTOMY     RIGHT KNEE ARTHROSCOPY  X2  BEFORE 2011   RIGHT KNEE ARTHROSCOPY/ PARTIAL LATERAL MENISECTOMY/ TRICOMPARTMENT CHONDROPLASTY/ DECOMPRESSION CYST  10/26/2009   RIGHT KNEE CLOSED MANIPULATION  09/11/2010   RIGHT SHOULDER ARTHROSCOPY  2010  &  2004   TIBIA CYST REMOVED AND ORIF  LEG FX  1958   TONSILLECTOMY  1968   TOTAL KNEE ARTHROPLASTY  07/16/2010   RIGHT   TOTAL KNEE ARTHROPLASTY Left 03/22/2024   Procedure: ARTHROPLASTY, KNEE, TOTAL;  Surgeon: Josefina Chew, MD;  Location: [facility];  Service: Orthopedics;  Laterality: Left;   TOTAL THYROIDECTOMY  2009   CANCER  (POST-OP BLEED)  AND RADIATION TX   trigger finger repair      07/2023   trigger fingers Right 2013   3rd and 4th fingers   UPPER GASTROINTESTINAL ENDOSCOPY     VAGINAL HYSTERECTOMY  1990   Patient Active Problem List   Diagnosis Date Noted   Pain of right thumb 02/26/2024   Precordial pain 12/23/2023   Primary osteoarthritis of left knee with subchondral insufficiency fractures 09/23/2023   History of arthroplasty of right knee 07/03/2023   Primary osteoarthritis of right shoulder 07/03/2023   Coronary  artery disease involving native coronary artery of native heart without angina pectoris 05/25/2023   Agatston coronary artery calcium  score greater than 400 11/06/2022   Statin myopathy 11/06/2022   Frequent UTI 10/29/2022   Wart of hand 09/30/2022   Acute cystitis without hematuria 05/14/2021   Medial epicondylitis, right 03/25/2021   Trigger thumb, right thumb 11/19/2020   Right wrist pain 10/23/2020   Osteoporosis 04/05/2020   Trigger finger, left middle finger 07/27/2019   Chronic neck pain 11/10/2018   Orthopedic hardware present 02/17/2018   Controlled diabetes mellitus type 2 with complications (HCC) 12/15/2017   Intractable chronic migraine without aura and with status migrainosus 12/15/2017   Arthralgia 12/15/2017   Joint pain 12/15/2017   Migraine, transformed 12/15/2017   Chronic migraine without aura, with intractable migraine, so stated, with status migrainosus 12/15/2017   Pain of left shoulder joint on movement 10/19/2017   OSA (obstructive sleep apnea) 06/26/2017   Snoring 03/25/2016   Palpitations 03/25/2016   Headache 03/25/2016   Sensory disorder of trigeminal nerve 11/26/2015   Spondylolisthesis of lumbar region 08/09/2014   Lumbar adjacent segment disease with spondylolisthesis 08/09/2014   Back pain 02/17/2014   Macular degeneration 02/17/2014   Backache 02/17/2014   Other and unspecified hyperlipidemia 10/14/2013   Hyperlipidemia 10/14/2013   Onychomycosis 12/01/2012   Arthritis 04/12/2012   Hypothyroidism 04/12/2012    PCP: Copland, MD  REFERRING PROVIDER: Josefina, MD  REFERRING DIAG: s/p Left TKA  THERAPY DIAG:  Acute pain of left knee  Stiffness of left knee, not elsewhere classified  Difficulty in walking, not elsewhere classified  Localized edema  Rationale for Evaluation and Treatment: Rehabilitation  ONSET DATE: 03/22/24  SUBJECTIVE:   SUBJECTIVE STATEMENT: Patient reports that she started having some severe pain in the left knee,  reports that she had to use crutches, narcotics or walker since May.  Underwent TKA on 03/22/24, went home same day.  REports that she has been in a lot of pain, doing HEP.  PERTINENT HISTORY: R TKA, Multiple back surgeries, fibromyalgia, GERD, HTN, DM PAIN:  Are you having pain? Yes: NPRS scale: 2/10 Pain location: left knee Pain description: ache Aggravating factors: standing, walking 5 minutes bending pain up to 8/10 Relieving factors: propping up, ice rest, pain meds at best pain a 2/10  PRECAUTIONS: None  RED FLAGS: None   WEIGHT BEARING RESTRICTIONS: No  FALLS:  Has patient fallen in last 6 months? No  LIVING ENVIRONMENT: Lives with: lives with their family Lives in: House/apartment Stairs: Yes: Internal: 2 steps; can reach both Has following equipment at home: Single point  cane, Walker - 2 wheeled, Crutches, and shower chair  OCCUPATION: retired  PLOF: Independent and shopping and housework  PATIENT GOALS: have normal walk without device, less pain  NEXT MD VISIT: 04/06/24  OBJECTIVE:  Note: Objective measures were completed at Evaluation unless otherwise noted.   COGNITION: Overall cognitive status: Within functional limits for tasks assessed     SENSATION: WFL  EDEMA:  Circumferential: mid patella 47 cm on the left, 40 cm on the right  MUSCLE LENGTH: Tight calves and HS  POSTURE: rounded shoulders and forward head  PALPATION: Tender, ecchymosis left thigh and knee, inner and posterior  LOWER EXTREMITY ROM:  Active ROM AROM LEft eval PROM Left eval  Hip flexion    Hip extension    Hip abduction    Hip adduction    Hip internal rotation    Hip external rotation    Knee flexion 65 70  Knee extension 15 9  Ankle dorsiflexion    Ankle plantarflexion    Ankle inversion    Ankle eversion     (Blank rows = not tested)  LOWER EXTREMITY MMT:  MMT Right eval Left eval  Hip flexion    Hip extension    Hip abduction    Hip adduction     Hip internal rotation    Hip external rotation    Knee flexion  2  Knee extension  2  Ankle dorsiflexion    Ankle plantarflexion    Ankle inversion    Ankle eversion     (Blank rows = not tested)  LOWER EXTREMITY SPECIAL TESTS:  Knee special tests: quad lag, ballotable patella  FUNCTIONAL TESTS:  Timed up and go (TUG): 17 seconds  GAIT: Distance walked: 60 feet Assistive device utilized: Walker - 2 wheeled Level of assistance: Modified independence Comments: mild antalgic gait                                                                                                                                TREATMENT DATE:  03/30/24 Evaluation  Nustep level 3 x 5 minutes Vaso low pressure 34 degrees with elevation   PATIENT EDUCATION:  Education details: POC/HEP Person educated: Patient Education method: Explanation, Facilities manager, and Handouts Education comprehension: verbalized understanding  HOME EXERCISE PROGRAM: Access Code: [email or web address] URL: [email or web address] Date: 03/30/2024 Prepared by: Ozell Mainland She is doing HEP from surgeon so just added below, she is doing QS, heel slides, SLR Exercises - Seated Knee Flexion AAROM  - 3 x daily - 7 x weekly - 1 sets - 5 reps - 30 hold  ASSESSMENT:  CLINICAL IMPRESSION: Patient is a 73 y.o. female who was seen today for physical therapy evaluation and treatment for s/p left TKA.  She has significant ecchymosis in the left thigh and knee area, has significant edema.  She is moving very well with the walker, biggest issue is poor flexion, c/o pain in the left thigh.  She did  not tolerate the vaso c/o pain and discomfort from position and from pressure.  OBJECTIVE IMPAIRMENTS: Abnormal gait, cardiopulmonary status limiting activity, decreased activity tolerance, decreased balance, decreased endurance, decreased mobility, difficulty walking, decreased ROM, decreased strength, increased edema, increased muscle  spasms, impaired flexibility, impaired vision/preception, improper body mechanics, postural dysfunction, and pain.   REHAB POTENTIAL: Good  CLINICAL DECISION MAKING: Evolving/moderate complexity  EVALUATION COMPLEXITY: Moderate   GOALS: Goals reviewed with patient? Yes  SHORT TERM GOALS: Target date: 04/20/24 Independent with initial HEP Baseline: Goal status: INITIAL  LONG TERM GOALS: Target date: 06/29/24  Independent with advanced HEP Baseline:  Goal status: INITIAL  2.  Independent with RICE Baseline:  Goal status: INITIAL  3.  Decrease pain 50% for better function and quality of life Baseline:  Goal status: INITIAL  4.  Decrease TUG to 12 seconds without device Baseline:  Goal status: INITIAL  5.  Increase AROM of the left knee to 4-116 degrees flexion Baseline:  Goal status: INITIAL  6.  Go up and down steps step over step Baseline:  Goal status: INITIAL   PLAN:  PT FREQUENCY: 2x/week  PT DURATION: 12 weeks  PLANNED INTERVENTIONS: 97164- PT Re-evaluation, 97110-Therapeutic exercises, 97530- Therapeutic activity, 97112- Neuromuscular re-education, 97535- Self Care, 02859- Manual therapy, [phone]- Gait training, [phone]- Electrical stimulation (unattended), 97016- Vasopneumatic device, Patient/Family education, Balance training, Stair training, Taping, Joint mobilization, and Cryotherapy  PLAN FOR NEXT SESSION: careful with back, gain ROM and help swelling   Danikah Budzik W, PT 03/30/2024, 8:03 AM

## 2024-04-01 ENCOUNTER — Ambulatory Visit: Admitting: Physical Therapy

## 2024-04-01 DIAGNOSIS — M25662 Stiffness of left knee, not elsewhere classified: Secondary | ICD-10-CM

## 2024-04-01 DIAGNOSIS — M25562 Pain in left knee: Secondary | ICD-10-CM

## 2024-04-01 DIAGNOSIS — R262 Difficulty in walking, not elsewhere classified: Secondary | ICD-10-CM

## 2024-04-01 DIAGNOSIS — R600 Localized edema: Secondary | ICD-10-CM

## 2024-04-01 NOTE — Therapy (Signed)
 OUTPATIENT PHYSICAL THERAPY LOWER EXTREMITY    Patient Name: Debbie Hayes MRN: [id number] DOB:30-Jul-1950, 73 y.o., female Today's Date: 04/01/2024  END OF SESSION:  PT End of Session - 04/01/24 0924     Visit Number 2    Date for PT Re-Evaluation 06/29/24    Authorization Type BC[employer] medicare    PT Start Time [phone]    PT Stop Time 1008    PT Time Calculation (min) 43 min          Past Medical History:  Diagnosis Date   Allergy    Asthma    Cancer (HCC)    thyroid  cancer   Cataract    Chronic migraine BOTOX INJECTION EVERY 3 MONTHS   Chronic pain in right shoulder    Constipation    Coronary artery disease    Diabetes mellitus    Disorder of inner ear CAUSES VERTIGO OCCASIONALLY   Fibromyalgia    GERD (gastroesophageal reflux disease)    Hemorrhoid    History of bladder infections    History of thyroid  cancer 2009  S/P TOTAL THYROIDECTOMY AND RADIATION   Hyperlipidemia    Hypertension CARDIOLOGIST- DR BLANCA- WILL REQUEST LATE NOTE   DENIES S & S   Hypothyroidism    Insomnia    Left shoulder pain    Macular degeneration    MRSA infection    nasal treated more than 15 years ago   Normal nuclear stress test 01/25/2009   OA (osteoarthritis) JOINTS   Osteopenia    Osteoporosis    Painful orthopaedic hardware (HCC)    left foot   Pneumonia    PONV (postoperative nausea and vomiting) SEVERE   Rotator cuff disorder LEFT SHOULDER RTC IMPINGMENT   Sleep apnea    mouth guard, no cpap    Sleep apnea in adult 06/26/2017   Spondylolisthesis of lumbar region    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   Wears glasses    Past Surgical History:  Procedure Laterality Date   BACK SURGERY     Dr Mavis- spondylosis   BILATERAL CARPAL TUNNEL RELEASE  1994   BILATERAL ELBOW SURG.  1999   BILATERAL SALPINGOOPHECTOMY  1993   POST-OP URETER REPAIR 12 DAYS AFTER    CATARACT EXTRACTION, BILATERAL     COLONOSCOPY     EYE SURGERY     FOOT ARTHRODESIS Left 07/10/2016    Procedure: LEFT 2ND TARSAL METATARSAL ARTHRODESIS  GASTROC RECESSION LEFT LAPIDUS MODIFIED MCBRIDE BUIONECTOMY;  Surgeon: Norleen Armor, MD;  Location: [facility];  Service: Orthopedics;  Laterality: Left;   GASTROC RECESSION EXTREMITY Left 07/10/2016   Procedure: GASTROC RECESSION EXTREMITY;  Surgeon: Norleen Armor, MD;  Location: [facility];  Service: Orthopedics;  Laterality: Left;   HARDWARE REMOVAL Left 05/20/2018   Procedure: Left foot removal of deep implants;  Surgeon: Armor Norleen, MD;  Location: [facility];  Service: Orthopedics;  Laterality: Left;    LEFT THUMB JOINT REPLACEMENT  2005   RIGHT DONE IN 2004   OOPHORECTOMY     POLYPECTOMY     RIGHT KNEE ARTHROSCOPY  X2  BEFORE 2011   RIGHT KNEE ARTHROSCOPY/ PARTIAL LATERAL MENISECTOMY/ TRICOMPARTMENT CHONDROPLASTY/ DECOMPRESSION CYST  10/26/2009   RIGHT KNEE CLOSED MANIPULATION  09/11/2010   RIGHT SHOULDER ARTHROSCOPY  2010  &  2004   TIBIA CYST REMOVED AND ORIF LEG FX  1958   TONSILLECTOMY  1968   TOTAL KNEE ARTHROPLASTY  07/16/2010   RIGHT  TOTAL KNEE ARTHROPLASTY Left 03/22/2024   Procedure: ARTHROPLASTY, KNEE, TOTAL;  Surgeon: Josefina Chew, MD;  Location: [facility];  Service: Orthopedics;  Laterality: Left;   TOTAL THYROIDECTOMY  2009   CANCER  (POST-OP BLEED)  AND RADIATION TX   trigger finger repair      07/2023   trigger fingers Right 2013   3rd and 4th fingers   UPPER GASTROINTESTINAL ENDOSCOPY     VAGINAL HYSTERECTOMY  1990   Patient Active Problem List   Diagnosis Date Noted   Pain of right thumb 02/26/2024   Precordial pain 12/23/2023   Primary osteoarthritis of left knee with subchondral insufficiency fractures 09/23/2023   History of arthroplasty of right knee 07/03/2023   Primary osteoarthritis of right shoulder 07/03/2023   Coronary artery disease involving native coronary artery of native heart without angina pectoris 05/25/2023   Agatston coronary artery calcium   score greater than 400 11/06/2022   Statin myopathy 11/06/2022   Frequent UTI 10/29/2022   Wart of hand 09/30/2022   Acute cystitis without hematuria 05/14/2021   Medial epicondylitis, right 03/25/2021   Trigger thumb, right thumb 11/19/2020   Right wrist pain 10/23/2020   Osteoporosis 04/05/2020   Trigger finger, left middle finger 07/27/2019   Chronic neck pain 11/10/2018   Orthopedic hardware present 02/17/2018   Controlled diabetes mellitus type 2 with complications (HCC) 12/15/2017   Intractable chronic migraine without aura and with status migrainosus 12/15/2017   Arthralgia 12/15/2017   Joint pain 12/15/2017   Migraine, transformed 12/15/2017   Chronic migraine without aura, with intractable migraine, so stated, with status migrainosus 12/15/2017   Pain of left shoulder joint on movement 10/19/2017   OSA (obstructive sleep apnea) 06/26/2017   Snoring 03/25/2016   Palpitations 03/25/2016   Headache 03/25/2016   Sensory disorder of trigeminal nerve 11/26/2015   Spondylolisthesis of lumbar region 08/09/2014   Lumbar adjacent segment disease with spondylolisthesis 08/09/2014   Back pain 02/17/2014   Macular degeneration 02/17/2014   Backache 02/17/2014   Other and unspecified hyperlipidemia 10/14/2013   Hyperlipidemia 10/14/2013   Onychomycosis 12/01/2012   Arthritis 04/12/2012   Hypothyroidism 04/12/2012    PCP: Copland, MD  REFERRING PROVIDER: Josefina, MD  REFERRING DIAG: s/p Left TKA  THERAPY DIAG:  Acute pain of left knee  Stiffness of left knee, not elsewhere classified  Difficulty in walking, not elsewhere classified  Localized edema  Rationale for Evaluation and Treatment: Rehabilitation  ONSET DATE: 03/22/24  SUBJECTIVE:   SUBJECTIVE STATEMENT: Amb in with SPC , tremendous bruising and c/o thigh pain more than knee pain PERTINENT HISTORY: R TKA, Multiple back surgeries, fibromyalgia, GERD, HTN, DM PAIN:  Are you having pain? Yes: NPRS scale:  4/10 Pain location: left knee Pain description: ache Aggravating factors: standing, walking 5 minutes bending pain up to 8/10 Relieving factors: propping up, ice rest, pain meds at best pain a 2/10  PRECAUTIONS: None  RED FLAGS: None   WEIGHT BEARING RESTRICTIONS: No  FALLS:  Has patient fallen in last 6 months? No  LIVING ENVIRONMENT: Lives with: lives with their family Lives in: House/apartment Stairs: Yes: Internal: 2 steps; can reach both Has following equipment at home: Single point cane, Walker - 2 wheeled, Crutches, and shower chair  OCCUPATION: retired  PLOF: Independent and shopping and housework  PATIENT GOALS: have normal walk without device, less pain  NEXT MD VISIT: 04/06/24  OBJECTIVE:  Note: Objective measures were completed at Evaluation unless otherwise noted.   COGNITION: Overall cognitive status: Within  functional limits for tasks assessed     SENSATION: WFL  EDEMA:  Circumferential: mid patella 47 cm on the left, 40 cm on the right  MUSCLE LENGTH: Tight calves and HS  POSTURE: rounded shoulders and forward head  PALPATION: Tender, ecchymosis left thigh and knee, inner and posterior  LOWER EXTREMITY ROM:  Active ROM AROM LEft eval PROM Left eval LLE 04/01/24  Hip flexion     Hip extension     Hip abduction     Hip adduction     Hip internal rotation     Hip external rotation     Knee flexion 65 70 82  Knee extension 15 9 5   Ankle dorsiflexion     Ankle plantarflexion     Ankle inversion     Ankle eversion      (Blank rows = not tested)  LOWER EXTREMITY MMT:  MMT Right eval Left eval  Hip flexion    Hip extension    Hip abduction    Hip adduction    Hip internal rotation    Hip external rotation    Knee flexion  2  Knee extension  2  Ankle dorsiflexion    Ankle plantarflexion    Ankle inversion    Ankle eversion     (Blank rows = not tested)  LOWER EXTREMITY SPECIAL TESTS:  Knee special tests: quad lag,  ballotable patella  FUNCTIONAL TESTS:  Timed up and go (TUG): 17 seconds  GAIT: Distance walked: 60 feet Assistive device utilized: Environmental consultant - 2 wheeled Level of assistance: Modified independence Comments: mild antalgic gait                                                                                                                                TREATMENT DATE:   04/01/24 Nustep L 4 PROM flex and ext Pat mobs Gentle STW to LEft thigh LAQ 2 sets 10 Yellow tband HS curl 2 sets 10 Step stretch Step up Calf stretch on step VASO low flat 6 min 03/30/24 Evaluation  Nustep level 3 x 5 minutes Vaso low pressure 34 degrees with elevation   PATIENT EDUCATION:  Education details: POC/HEP Person educated: Patient Education method: Explanation, Facilities manager, and Handouts Education comprehension: verbalized understanding  HOME EXERCISE PROGRAM: Access Code: [email or web address] URL: [email or web address] Date: 04/01/2024 Prepared by: Ishia Tenorio  Exercises - Standing Knee Flexion Stretch on Step  - 3- x daily - 7 x weekly - 1 sets - 5-10 reps - 5-10 hold - Standing Gastroc Stretch on Step  - 3 x daily - 7 x weekly - 1 sets - 10 reps Access Code: [email or web address] URL: [email or web address] Date: 03/30/2024 Prepared by: Ozell Mainland She is doing HEP from surgeon so just added below, she is doing QS, heel slides, SLR Exercises - Seated Knee Flexion AAROM  - 3 x daily - 7 x weekly - 1 sets - 5 reps - 30 hold  ASSESSMENT:  CLINICAL IMPRESSION: pt arrives after eval and amb in on [facility] ,moving much better and improved ROM. Tolerated PROM and STW well. Added ex with cuing and  added to HEP. Limited tolerance still with VASO even with position change   OBJECTIVE IMPAIRMENTS: Abnormal gait, cardiopulmonary status limiting activity, decreased activity tolerance, decreased balance, decreased endurance, decreased mobility, difficulty walking, decreased ROM,  decreased strength, increased edema, increased muscle spasms, impaired flexibility, impaired vision/preception, improper body mechanics, postural dysfunction, and pain.   REHAB POTENTIAL: Good  CLINICAL DECISION MAKING: Evolving/moderate complexity  EVALUATION COMPLEXITY: Moderate   GOALS: Goals reviewed with patient? Yes  SHORT TERM GOALS: Target date: 04/20/24 Independent with initial HEP Baseline: Goal status: 04/01/24 MET  LONG TERM GOALS: Target date: 06/29/24  Independent with advanced HEP Baseline:  Goal status: INITIAL  2.  Independent with RICE Baseline:  Goal status: INITIAL  3.  Decrease pain 50% for better function and quality of life Baseline:  Goal status: INITIAL  4.  Decrease TUG to 12 seconds without device Baseline:  Goal status: INITIAL  5.  Increase AROM of the left knee to 4-116 degrees flexion Baseline:  Goal status: INITIAL  6.  Go up and down steps step over step Baseline:  Goal status: INITIAL   PLAN:  PT FREQUENCY: 2x/week  PT DURATION: 12 weeks  PLANNED INTERVENTIONS: 97164- PT Re-evaluation, 97110-Therapeutic exercises, 97530- Therapeutic activity, [id number]- Neuromuscular re-education, 97535- Self Care, 02859- Manual therapy, [phone]- Gait training, [phone]- Electrical stimulation (unattended), 97016- Vasopneumatic device, Patient/Family education, Balance training, Stair training, Taping, Joint mobilization, and Cryotherapy  PLAN FOR NEXT SESSION: careful with back, gain ROM and help swelling   Kedarius Aloisi,ANGIE, PTA 04/01/2024, 10:08 AM

## 2024-04-04 ENCOUNTER — Encounter: Payer: Self-pay | Admitting: Physical Therapy

## 2024-04-04 ENCOUNTER — Ambulatory Visit: Admitting: Physical Therapy

## 2024-04-04 DIAGNOSIS — M25562 Pain in left knee: Secondary | ICD-10-CM | POA: Diagnosis not present

## 2024-04-04 DIAGNOSIS — M25662 Stiffness of left knee, not elsewhere classified: Secondary | ICD-10-CM

## 2024-04-04 DIAGNOSIS — R262 Difficulty in walking, not elsewhere classified: Secondary | ICD-10-CM

## 2024-04-04 DIAGNOSIS — R600 Localized edema: Secondary | ICD-10-CM

## 2024-04-04 NOTE — Therapy (Signed)
 OUTPATIENT PHYSICAL THERAPY LOWER EXTREMITY    Patient Name: Debbie Hayes MRN: [id number] DOB:Feb 28, 1951, 73 y.o., female Today's Date: 04/04/2024  END OF SESSION:  PT End of Session - 04/04/24 1102     Visit Number 3    Date for PT Re-Evaluation 06/29/24    Authorization Type BCBS medicare    PT Start Time 1058    PT Stop Time 1141    PT Time Calculation (min) 43 min    Activity Tolerance Patient tolerated treatment well    Behavior During Therapy WFL for tasks assessed/performed          Past Medical History:  Diagnosis Date   Allergy    Asthma    Cancer (HCC)    thyroid  cancer   Cataract    Chronic migraine BOTOX INJECTION EVERY 3 MONTHS   Chronic pain in right shoulder    Constipation    Coronary artery disease    Diabetes mellitus    Disorder of inner ear CAUSES VERTIGO OCCASIONALLY   Fibromyalgia    GERD (gastroesophageal reflux disease)    Hemorrhoid    History of bladder infections    History of thyroid  cancer 2009  S/P TOTAL THYROIDECTOMY AND RADIATION   Hyperlipidemia    Hypertension CARDIOLOGIST- DR BLANCA- WILL REQUEST LATE NOTE   DENIES S & S   Hypothyroidism    Insomnia    Left shoulder pain    Macular degeneration    MRSA infection    nasal treated more than 15 years ago   Normal nuclear stress test 01/25/2009   OA (osteoarthritis) JOINTS   Osteopenia    Osteoporosis    Painful orthopaedic hardware (HCC)    left foot   Pneumonia    PONV (postoperative nausea and vomiting) SEVERE   Rotator cuff disorder LEFT SHOULDER RTC IMPINGMENT   Sleep apnea    mouth guard, no cpap    Sleep apnea in adult 06/26/2017   Spondylolisthesis of lumbar region    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   Wears glasses    Past Surgical History:  Procedure Laterality Date   BACK SURGERY     Dr Mavis- spondylosis   BILATERAL CARPAL TUNNEL RELEASE  1994   BILATERAL ELBOW SURG.  1999   BILATERAL SALPINGOOPHECTOMY  1993   POST-OP URETER REPAIR 12 DAYS  AFTER    CATARACT EXTRACTION, BILATERAL     COLONOSCOPY     EYE SURGERY     FOOT ARTHRODESIS Left 07/10/2016   Procedure: LEFT 2ND TARSAL METATARSAL ARTHRODESIS  GASTROC RECESSION LEFT LAPIDUS MODIFIED MCBRIDE BUIONECTOMY;  Surgeon: Norleen Armor, MD;  Location: [facility];  Service: Orthopedics;  Laterality: Left;   GASTROC RECESSION EXTREMITY Left 07/10/2016   Procedure: GASTROC RECESSION EXTREMITY;  Surgeon: Norleen Armor, MD;  Location: [facility];  Service: Orthopedics;  Laterality: Left;   HARDWARE REMOVAL Left 05/20/2018   Procedure: Left foot removal of deep implants;  Surgeon: Armor Norleen, MD;  Location: [facility];  Service: Orthopedics;  Laterality: Left;    LEFT THUMB JOINT REPLACEMENT  2005   RIGHT DONE IN 2004   OOPHORECTOMY     POLYPECTOMY     RIGHT KNEE ARTHROSCOPY  X2  BEFORE 2011   RIGHT KNEE ARTHROSCOPY/ PARTIAL LATERAL MENISECTOMY/ TRICOMPARTMENT CHONDROPLASTY/ DECOMPRESSION CYST  10/26/2009   RIGHT KNEE CLOSED MANIPULATION  09/11/2010   RIGHT SHOULDER ARTHROSCOPY  2010  &  2004   TIBIA CYST REMOVED AND ORIF  LEG FX  1958   TONSILLECTOMY  1968   TOTAL KNEE ARTHROPLASTY  07/16/2010   RIGHT   TOTAL KNEE ARTHROPLASTY Left 03/22/2024   Procedure: ARTHROPLASTY, KNEE, TOTAL;  Surgeon: Josefina Chew, MD;  Location: [facility];  Service: Orthopedics;  Laterality: Left;   TOTAL THYROIDECTOMY  2009   CANCER  (POST-OP BLEED)  AND RADIATION TX   trigger finger repair      07/2023   trigger fingers Right 2013   3rd and 4th fingers   UPPER GASTROINTESTINAL ENDOSCOPY     VAGINAL HYSTERECTOMY  1990   Patient Active Problem List   Diagnosis Date Noted   Pain of right thumb 02/26/2024   Precordial pain 12/23/2023   Primary osteoarthritis of left knee with subchondral insufficiency fractures 09/23/2023   History of arthroplasty of right knee 07/03/2023   Primary osteoarthritis of right shoulder 07/03/2023   Coronary artery disease  involving native coronary artery of native heart without angina pectoris 05/25/2023   Agatston coronary artery calcium  score greater than 400 11/06/2022   Statin myopathy 11/06/2022   Frequent UTI 10/29/2022   Wart of hand 09/30/2022   Acute cystitis without hematuria 05/14/2021   Medial epicondylitis, right 03/25/2021   Trigger thumb, right thumb 11/19/2020   Right wrist pain 10/23/2020   Osteoporosis 04/05/2020   Trigger finger, left middle finger 07/27/2019   Chronic neck pain 11/10/2018   Orthopedic hardware present 02/17/2018   Controlled diabetes mellitus type 2 with complications (HCC) 12/15/2017   Intractable chronic migraine without aura and with status migrainosus 12/15/2017   Arthralgia 12/15/2017   Joint pain 12/15/2017   Migraine, transformed 12/15/2017   Chronic migraine without aura, with intractable migraine, so stated, with status migrainosus 12/15/2017   Pain of left shoulder joint on movement 10/19/2017   OSA (obstructive sleep apnea) 06/26/2017   Snoring 03/25/2016   Palpitations 03/25/2016   Headache 03/25/2016   Sensory disorder of trigeminal nerve 11/26/2015   Spondylolisthesis of lumbar region 08/09/2014   Lumbar adjacent segment disease with spondylolisthesis 08/09/2014   Back pain 02/17/2014   Macular degeneration 02/17/2014   Backache 02/17/2014   Other and unspecified hyperlipidemia 10/14/2013   Hyperlipidemia 10/14/2013   Onychomycosis 12/01/2012   Arthritis 04/12/2012   Hypothyroidism 04/12/2012    PCP: Copland, MD  REFERRING PROVIDER: Josefina, MD  REFERRING DIAG: s/p Left TKA  THERAPY DIAG:  Acute pain of left knee  Stiffness of left knee, not elsewhere classified  Difficulty in walking, not elsewhere classified  Localized edema  Rationale for Evaluation and Treatment: Rehabilitation  ONSET DATE: 03/22/24  SUBJECTIVE:   SUBJECTIVE STATEMENT: Pain a 6-7/10 in the left knee, reports that she feels like the last treatment was too  much, she reports that she had increased pain for a few days PERTINENT HISTORY: R TKA, Multiple back surgeries, fibromyalgia, GERD, HTN, DM PAIN:  Are you having pain? Yes: NPRS scale: 4/10 Pain location: left knee Pain description: ache Aggravating factors: standing, walking 5 minutes bending pain up to 8/10 Relieving factors: propping up, ice rest, pain meds at best pain a 2/10  PRECAUTIONS: None  RED FLAGS: None   WEIGHT BEARING RESTRICTIONS: No  FALLS:  Has patient fallen in last 6 months? No  LIVING ENVIRONMENT: Lives with: lives with their family Lives in: House/apartment Stairs: Yes: Internal: 2 steps; can reach both Has following equipment at home: Single point cane, Walker - 2 wheeled, Crutches, and shower chair  OCCUPATION: retired  PLOF: Independent and shopping and housework  PATIENT GOALS: have normal walk without device, less pain  NEXT MD VISIT: 04/06/24  OBJECTIVE:  Note: Objective measures were completed at Evaluation unless otherwise noted.   COGNITION: Overall cognitive status: Within functional limits for tasks assessed     SENSATION: WFL  EDEMA:  Circumferential: mid patella 47 cm on the left, 40 cm on the right  MUSCLE LENGTH: Tight calves and HS  POSTURE: rounded shoulders and forward head  PALPATION: Tender, ecchymosis left thigh and knee, inner and posterior  LOWER EXTREMITY ROM:  Active ROM AROM LEft eval PROM Left eval LLE 04/01/24 AROM  LLE 04/04/24  Hip flexion      Hip extension      Hip abduction      Hip adduction      Hip internal rotation      Hip external rotation      Knee flexion 65 70 82 85  Knee extension 15 9 5 5   Ankle dorsiflexion      Ankle plantarflexion      Ankle inversion      Ankle eversion       (Blank rows = not tested)  LOWER EXTREMITY MMT:  MMT Right eval Left eval  Hip flexion    Hip extension    Hip abduction    Hip adduction    Hip internal rotation    Hip external rotation     Knee flexion  2  Knee extension  2  Ankle dorsiflexion    Ankle plantarflexion    Ankle inversion    Ankle eversion     (Blank rows = not tested)  LOWER EXTREMITY SPECIAL TESTS:  Knee special tests: quad lag, ballotable patella  FUNCTIONAL TESTS:  Timed up and go (TUG): 17 seconds  GAIT: Distance walked: 60 feet Assistive device utilized: Environmental consultant - 2 wheeled Level of assistance: Modified independence Comments: mild antalgic gait                                                                                                                                TREATMENT DATE:  04/04/24 Bike partial reps x 5 minutes Gait without device x 200 feet LAQ 2x10 SAQ 2x10 STM and scar mobilization to the left knee  Feet on ball K2C, rotation, small bridge and isometric abs Ball b/n knees squeeze STM to the quad and ITB area Gentle PROM into flexion she does not tolerate this well Gait without device 200 feet SBA  04/01/24 Nustep L 4 PROM flex and ext Pat mobs Gentle STW to LEft thigh LAQ 2 sets 10 Yellow tband HS curl 2 sets 10 Step stretch Step up Calf stretch on step VASO low flat 6 min 03/30/24 Evaluation  Nustep level 3 x 5 minutes Vaso low pressure 34 degrees with elevation   PATIENT EDUCATION:  Education details: POC/HEP Person educated: Patient Education method: Explanation, Facilities manager, and Handouts Education comprehension: verbalized understanding  HOME EXERCISE PROGRAM: Access  Code: [email or web address] URL: [email or web address] Date: 04/01/2024 Prepared by: Angela Payseur  Exercises - Standing Knee Flexion Stretch on Step  - 3- x daily - 7 x weekly - 1 sets - 5-10 reps - 5-10 hold - Standing Gastroc Stretch on Step  - 3 x daily - 7 x weekly - 1 sets - 10 reps Access Code: [email or web address] URL: [email or web address] Date: 03/30/2024 Prepared by: Ozell Mainland She is doing HEP from surgeon so just added below, she is doing QS, heel slides,  SLR Exercises - Seated Knee Flexion AAROM  - 3 x daily - 7 x weekly - 1 sets - 5 reps - 30 hold  ASSESSMENT:  CLINICAL IMPRESSION: Patient doing well with her improvement in ROM, she is still very resistant to flexion, c/o a high rating of pain in the quad and patella area, she is walking great with a cane and at times without device.  Has made nice progress with ROM over the past week   OBJECTIVE IMPAIRMENTS: Abnormal gait, cardiopulmonary status limiting activity, decreased activity tolerance, decreased balance, decreased endurance, decreased mobility, difficulty walking, decreased ROM, decreased strength, increased edema, increased muscle spasms, impaired flexibility, impaired vision/preception, improper body mechanics, postural dysfunction, and pain.   REHAB POTENTIAL: Good  CLINICAL DECISION MAKING: Evolving/moderate complexity  EVALUATION COMPLEXITY: Moderate   GOALS: Goals reviewed with patient? Yes  SHORT TERM GOALS: Target date: 04/20/24 Independent with initial HEP Baseline: Goal status: 04/01/24 MET  LONG TERM GOALS: Target date: 06/29/24  Independent with advanced HEP Baseline:  Goal status: INITIAL  2.  Independent with RICE Baseline:  Goal status: INITIAL  3.  Decrease pain 50% for better function and quality of life Baseline:  Goal status: INITIAL  4.  Decrease TUG to 12 seconds without device Baseline:  Goal status: INITIAL  5.  Increase AROM of the left knee to 4-116 degrees flexion Baseline:  Goal status: INITIAL  6.  Go up and down steps step over step Baseline:  Goal status: INITIAL   PLAN:  PT FREQUENCY: 2x/week  PT DURATION: 12 weeks  PLANNED INTERVENTIONS: 97164- PT Re-evaluation, 97110-Therapeutic exercises, 97530- Therapeutic activity, 97112- Neuromuscular re-education, 97535- Self Care, 02859- Manual therapy, [phone]- Gait training, [phone]- Electrical stimulation (unattended), 97016- Vasopneumatic device, Patient/Family education, Balance  training, Stair training, Taping, Joint mobilization, and Cryotherapy  PLAN FOR NEXT SESSION: careful with back, gain ROM and help swelling   Crystalmarie Yasin W, PT 04/04/2024, 11:03 AM

## 2024-04-05 ENCOUNTER — Other Ambulatory Visit: Payer: Self-pay | Admitting: Family Medicine

## 2024-04-05 DIAGNOSIS — G4700 Insomnia, unspecified: Secondary | ICD-10-CM

## 2024-04-07 ENCOUNTER — Ambulatory Visit: Admitting: Physical Therapy

## 2024-04-07 DIAGNOSIS — R600 Localized edema: Secondary | ICD-10-CM

## 2024-04-07 DIAGNOSIS — M25562 Pain in left knee: Secondary | ICD-10-CM | POA: Diagnosis not present

## 2024-04-07 DIAGNOSIS — R262 Difficulty in walking, not elsewhere classified: Secondary | ICD-10-CM

## 2024-04-07 DIAGNOSIS — M25662 Stiffness of left knee, not elsewhere classified: Secondary | ICD-10-CM

## 2024-04-07 NOTE — Therapy (Signed)
 OUTPATIENT PHYSICAL THERAPY LOWER EXTREMITY    Patient Name: Debbie Hayes MRN: [id number] DOB:12-Jun-1951, 73 y.o., female Today's Date: 04/07/2024  END OF SESSION:  PT End of Session - 04/07/24 1053     Visit Number 4    Date for PT Re-Evaluation 06/29/24    Authorization Type BCBS medicare    PT Start Time 1055    PT Stop Time 1140    PT Time Calculation (min) 45 min          Past Medical History:  Diagnosis Date   Allergy    Asthma    Cancer (HCC)    thyroid  cancer   Cataract    Chronic migraine BOTOX INJECTION EVERY 3 MONTHS   Chronic pain in right shoulder    Constipation    Coronary artery disease    Diabetes mellitus    Disorder of inner ear CAUSES VERTIGO OCCASIONALLY   Fibromyalgia    GERD (gastroesophageal reflux disease)    Hemorrhoid    History of bladder infections    History of thyroid  cancer 2009  S/P TOTAL THYROIDECTOMY AND RADIATION   Hyperlipidemia    Hypertension CARDIOLOGIST- DR BLANCA- WILL REQUEST LATE NOTE   DENIES S & S   Hypothyroidism    Insomnia    Left shoulder pain    Macular degeneration    MRSA infection    nasal treated more than 15 years ago   Normal nuclear stress test 01/25/2009   OA (osteoarthritis) JOINTS   Osteopenia    Osteoporosis    Painful orthopaedic hardware (HCC)    left foot   Pneumonia    PONV (postoperative nausea and vomiting) SEVERE   Rotator cuff disorder LEFT SHOULDER RTC IMPINGMENT   Sleep apnea    mouth guard, no cpap    Sleep apnea in adult 06/26/2017   Spondylolisthesis of lumbar region    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   Wears glasses    Past Surgical History:  Procedure Laterality Date   BACK SURGERY     Dr Mavis- spondylosis   BILATERAL CARPAL TUNNEL RELEASE  1994   BILATERAL ELBOW SURG.  1999   BILATERAL SALPINGOOPHECTOMY  1993   POST-OP URETER REPAIR 12 DAYS AFTER    CATARACT EXTRACTION, BILATERAL     COLONOSCOPY     EYE SURGERY     FOOT ARTHRODESIS Left 07/10/2016    Procedure: LEFT 2ND TARSAL METATARSAL ARTHRODESIS  GASTROC RECESSION LEFT LAPIDUS MODIFIED MCBRIDE BUIONECTOMY;  Surgeon: Norleen Armor, MD;  Location: [facility];  Service: Orthopedics;  Laterality: Left;   GASTROC RECESSION EXTREMITY Left 07/10/2016   Procedure: GASTROC RECESSION EXTREMITY;  Surgeon: Norleen Armor, MD;  Location: [facility];  Service: Orthopedics;  Laterality: Left;   HARDWARE REMOVAL Left 05/20/2018   Procedure: Left foot removal of deep implants;  Surgeon: Armor Norleen, MD;  Location: [facility];  Service: Orthopedics;  Laterality: Left;    LEFT THUMB JOINT REPLACEMENT  2005   RIGHT DONE IN 2004   OOPHORECTOMY     POLYPECTOMY     RIGHT KNEE ARTHROSCOPY  X2  BEFORE 2011   RIGHT KNEE ARTHROSCOPY/ PARTIAL LATERAL MENISECTOMY/ TRICOMPARTMENT CHONDROPLASTY/ DECOMPRESSION CYST  10/26/2009   RIGHT KNEE CLOSED MANIPULATION  09/11/2010   RIGHT SHOULDER ARTHROSCOPY  2010  &  2004   TIBIA CYST REMOVED AND ORIF LEG FX  1958   TONSILLECTOMY  1968   TOTAL KNEE ARTHROPLASTY  07/16/2010   RIGHT  TOTAL KNEE ARTHROPLASTY Left 03/22/2024   Procedure: ARTHROPLASTY, KNEE, TOTAL;  Surgeon: Josefina Chew, MD;  Location: [facility];  Service: Orthopedics;  Laterality: Left;   TOTAL THYROIDECTOMY  2009   CANCER  (POST-OP BLEED)  AND RADIATION TX   trigger finger repair      07/2023   trigger fingers Right 2013   3rd and 4th fingers   UPPER GASTROINTESTINAL ENDOSCOPY     VAGINAL HYSTERECTOMY  1990   Patient Active Problem List   Diagnosis Date Noted   Pain of right thumb 02/26/2024   Precordial pain 12/23/2023   Primary osteoarthritis of left knee with subchondral insufficiency fractures 09/23/2023   History of arthroplasty of right knee 07/03/2023   Primary osteoarthritis of right shoulder 07/03/2023   Coronary artery disease involving native coronary artery of native heart without angina pectoris 05/25/2023   Agatston coronary artery calcium   score greater than 400 11/06/2022   Statin myopathy 11/06/2022   Frequent UTI 10/29/2022   Wart of hand 09/30/2022   Acute cystitis without hematuria 05/14/2021   Medial epicondylitis, right 03/25/2021   Trigger thumb, right thumb 11/19/2020   Right wrist pain 10/23/2020   Osteoporosis 04/05/2020   Trigger finger, left middle finger 07/27/2019   Chronic neck pain 11/10/2018   Orthopedic hardware present 02/17/2018   Controlled diabetes mellitus type 2 with complications (HCC) 12/15/2017   Intractable chronic migraine without aura and with status migrainosus 12/15/2017   Arthralgia 12/15/2017   Joint pain 12/15/2017   Migraine, transformed 12/15/2017   Chronic migraine without aura, with intractable migraine, so stated, with status migrainosus 12/15/2017   Pain of left shoulder joint on movement 10/19/2017   OSA (obstructive sleep apnea) 06/26/2017   Snoring 03/25/2016   Palpitations 03/25/2016   Headache 03/25/2016   Sensory disorder of trigeminal nerve 11/26/2015   Spondylolisthesis of lumbar region 08/09/2014   Lumbar adjacent segment disease with spondylolisthesis 08/09/2014   Back pain 02/17/2014   Macular degeneration 02/17/2014   Backache 02/17/2014   Other and unspecified hyperlipidemia 10/14/2013   Hyperlipidemia 10/14/2013   Onychomycosis 12/01/2012   Arthritis 04/12/2012   Hypothyroidism 04/12/2012    PCP: Copland, MD  REFERRING PROVIDER: Josefina, MD  REFERRING DIAG: s/p Left TKA  THERAPY DIAG:  Acute pain of left knee  Stiffness of left knee, not elsewhere classified  Difficulty in walking, not elsewhere classified  Localized edema  Rationale for Evaluation and Treatment: Rehabilitation  ONSET DATE: 03/22/24  SUBJECTIVE:   SUBJECTIVE STATEMENT: Pain a 6/10 in the left knee. PA pleased yesterday. Amb in without AD    R TKA, Multiple back surgeries, fibromyalgia, GERD, HTN, DM PAIN:  Are you having pain? Yes: NPRS scale: 4/10 Pain location: left  knee Pain description: ache Aggravating factors: standing, walking 5 minutes bending pain up to 8/10 Relieving factors: propping up, ice rest, pain meds at best pain a 2/10  PRECAUTIONS: None  RED FLAGS: None   WEIGHT BEARING RESTRICTIONS: No  FALLS:  Has patient fallen in last 6 months? No  LIVING ENVIRONMENT: Lives with: lives with their family Lives in: House/apartment Stairs: Yes: Internal: 2 steps; can reach both Has following equipment at home: Single point cane, Walker - 2 wheeled, Crutches, and shower chair  OCCUPATION: retired  PLOF: Independent and shopping and housework  PATIENT GOALS: have normal walk without device, less pain  NEXT MD VISIT: 04/06/24  OBJECTIVE:  Note: Objective measures were completed at Evaluation unless otherwise noted.   COGNITION: Overall cognitive status: Within  functional limits for tasks assessed     SENSATION: WFL  EDEMA:  Circumferential: mid patella 47 cm on the left, 40 cm on the right  MUSCLE LENGTH: Tight calves and HS  POSTURE: rounded shoulders and forward head  PALPATION: Tender, ecchymosis left thigh and knee, inner and posterior  LOWER EXTREMITY ROM:  Active ROM AROM LEft eval PROM Left eval LLE 04/01/24 AROM  LLE 04/04/24  Hip flexion      Hip extension      Hip abduction      Hip adduction      Hip internal rotation      Hip external rotation      Knee flexion 65 70 82 85  Knee extension 15 9 5 5   Ankle dorsiflexion      Ankle plantarflexion      Ankle inversion      Ankle eversion       (Blank rows = not tested)  LOWER EXTREMITY MMT:  MMT Right eval Left eval  Hip flexion    Hip extension    Hip abduction    Hip adduction    Hip internal rotation    Hip external rotation    Knee flexion  2  Knee extension  2  Ankle dorsiflexion    Ankle plantarflexion    Ankle inversion    Ankle eversion     (Blank rows = not tested)  LOWER EXTREMITY SPECIAL TESTS:  Knee special tests: quad  lag, ballotable patella  FUNCTIONAL TESTS:  Timed up and go (TUG): 17 seconds  GAIT: Distance walked: 60 feet Assistive device utilized: Walker - 2 wheeled Level of assistance: Modified independence Comments: mild antalgic gait                                                                                                                                TREATMENT DATE:   04/07/24 Nustep L 4 LAQ 2 sets 10 HS curl 2 sets 10 Leg press 20# 2 sets 10 Step stretch, step tap and calf stretch on step -HEP review and answered questions. STM to the quad and ITB area Gentle PROM into flexion and ext- tolerated fair if gentle    04/04/24 Bike partial reps x 5 minutes Gait without device x 200 feet LAQ 2x10 SAQ 2x10 STM and scar mobilization to the left knee  Feet on ball K2C, rotation, small bridge and isometric abs Ball b/n knees squeeze STM to the quad and ITB area Gentle PROM into flexion she does not tolerate this well Gait without device 200 feet SBA  04/01/24 Nustep L 4 PROM flex and ext Pat mobs Gentle STW to LEft thigh LAQ 2 sets 10 Yellow tband HS curl 2 sets 10 Step stretch Step up Calf stretch on step VASO low flat 6 min 03/30/24 Evaluation  Nustep level 3 x 5 minutes Vaso low pressure 34 degrees with elevation   PATIENT EDUCATION:  Education details: POC/HEP Person educated: Patient Education method: Explanation, Facilities manager, and Handouts Education comprehension: verbalized understanding  HOME EXERCISE PROGRAM: Access Code: [email or web address] URL: [email or web address] Date: 04/01/2024 Prepared by: Christasia Angeletti  Exercises - Standing Knee Flexion Stretch on Step  - 3- x daily - 7 x weekly - 1 sets - 5-10 reps - 5-10 hold - Standing Gastroc Stretch on Step  - 3 x daily - 7 x weekly - 1 sets - 10 reps Access Code: [email or web address] URL: [email or web address] Date: 03/30/2024 Prepared by: Ozell Mainland She is doing HEP from  surgeon so just added below, she is doing QS, heel slides, SLR Exercises - Seated Knee Flexion AAROM  - 3 x daily - 7 x weekly - 1 sets - 5 reps - 30 hold  ASSESSMENT:  CLINICAL IMPRESSION:  Pt with increased pain but does see it is improving. Pt amb in without AD and slight limp. Pt weaning from pain meds. Increased act flexion 93 at end of session. Slower session d/t ain limitations OBJECTIVE IMPAIRMENTS: Abnormal gait, cardiopulmonary status limiting activity, decreased activity tolerance, decreased balance, decreased endurance, decreased mobility, difficulty walking, decreased ROM, decreased strength, increased edema, increased muscle spasms, impaired flexibility, impaired vision/preception, improper body mechanics, postural dysfunction, and pain.   REHAB POTENTIAL: Good  CLINICAL DECISION MAKING: Evolving/moderate complexity  EVALUATION COMPLEXITY: Moderate   GOALS: Goals reviewed with patient? Yes  SHORT TERM GOALS: Target date: 04/20/24 Independent with initial HEP Baseline: Goal status: 04/01/24 MET  LONG TERM GOALS: Target date: 06/29/24  Independent with advanced HEP Baseline:  Goal status: INITIAL  2.  Independent with RICE Baseline:  Goal status: INITIAL  3.  Decrease pain 50% for better function and quality of life Baseline:  Goal status: INITIAL  4.  Decrease TUG to 12 seconds without device Baseline:  Goal status: INITIAL  5.  Increase AROM of the left knee to 4-116 degrees flexion Baseline:  Goal status: INITIAL  6.  Go up and down steps step over step Baseline:  Goal status: INITIAL   PLAN:  PT FREQUENCY: 2x/week  PT DURATION: 12 weeks  PLANNED INTERVENTIONS: 97164- PT Re-evaluation, 97110-Therapeutic exercises, 97530- Therapeutic activity, [id number]- Neuromuscular re-education, 97535- Self Care, 02859- Manual therapy, [phone]- Gait training, [phone]- Electrical stimulation (unattended), 97016- Vasopneumatic device, Patient/Family education, Balance  training, Stair training, Taping, Joint mobilization, and Cryotherapy  PLAN FOR NEXT SESSION: careful with back, gain ROM and help swelling   Maynard David,ANGIE, PTA 04/07/2024, 10:57 AM

## 2024-04-11 ENCOUNTER — Ambulatory Visit

## 2024-04-11 DIAGNOSIS — M25662 Stiffness of left knee, not elsewhere classified: Secondary | ICD-10-CM

## 2024-04-11 DIAGNOSIS — M25562 Pain in left knee: Secondary | ICD-10-CM

## 2024-04-11 DIAGNOSIS — R600 Localized edema: Secondary | ICD-10-CM

## 2024-04-11 DIAGNOSIS — R262 Difficulty in walking, not elsewhere classified: Secondary | ICD-10-CM

## 2024-04-11 NOTE — Therapy (Signed)
 OUTPATIENT PHYSICAL THERAPY LOWER EXTREMITY    Patient Name: Debbie Hayes MRN: [id number] DOB:07-17-1951, 73 y.o., female Today's Date: 04/11/2024  END OF SESSION:  PT End of Session - 04/11/24 1057     Visit Number 5    Date for PT Re-Evaluation 06/29/24    Authorization Type BCBS medicare    PT Start Time 1057    PT Stop Time 1145    PT Time Calculation (min) 48 min           Past Medical History:  Diagnosis Date   Allergy    Asthma    Cancer (HCC)    thyroid  cancer   Cataract    Chronic migraine BOTOX INJECTION EVERY 3 MONTHS   Chronic pain in right shoulder    Constipation    Coronary artery disease    Diabetes mellitus    Disorder of inner ear CAUSES VERTIGO OCCASIONALLY   Fibromyalgia    GERD (gastroesophageal reflux disease)    Hemorrhoid    History of bladder infections    History of thyroid  cancer 2009  S/P TOTAL THYROIDECTOMY AND RADIATION   Hyperlipidemia    Hypertension CARDIOLOGIST- DR BLANCA- WILL REQUEST LATE NOTE   DENIES S & S   Hypothyroidism    Insomnia    Left shoulder pain    Macular degeneration    MRSA infection    nasal treated more than 15 years ago   Normal nuclear stress test 01/25/2009   OA (osteoarthritis) JOINTS   Osteopenia    Osteoporosis    Painful orthopaedic hardware (HCC)    left foot   Pneumonia    PONV (postoperative nausea and vomiting) SEVERE   Rotator cuff disorder LEFT SHOULDER RTC IMPINGMENT   Sleep apnea    mouth guard, no cpap    Sleep apnea in adult 06/26/2017   Spondylolisthesis of lumbar region    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   Wears glasses    Past Surgical History:  Procedure Laterality Date   BACK SURGERY     Dr Mavis- spondylosis   BILATERAL CARPAL TUNNEL RELEASE  1994   BILATERAL ELBOW SURG.  1999   BILATERAL SALPINGOOPHECTOMY  1993   POST-OP URETER REPAIR 12 DAYS AFTER    CATARACT EXTRACTION, BILATERAL     COLONOSCOPY     EYE SURGERY     FOOT ARTHRODESIS Left 07/10/2016    Procedure: LEFT 2ND TARSAL METATARSAL ARTHRODESIS  GASTROC RECESSION LEFT LAPIDUS MODIFIED MCBRIDE BUIONECTOMY;  Surgeon: Norleen Armor, MD;  Location: [facility];  Service: Orthopedics;  Laterality: Left;   GASTROC RECESSION EXTREMITY Left 07/10/2016   Procedure: GASTROC RECESSION EXTREMITY;  Surgeon: Norleen Armor, MD;  Location: [facility];  Service: Orthopedics;  Laterality: Left;   HARDWARE REMOVAL Left 05/20/2018   Procedure: Left foot removal of deep implants;  Surgeon: Armor Norleen, MD;  Location: [facility];  Service: Orthopedics;  Laterality: Left;    LEFT THUMB JOINT REPLACEMENT  2005   RIGHT DONE IN 2004   OOPHORECTOMY     POLYPECTOMY     RIGHT KNEE ARTHROSCOPY  X2  BEFORE 2011   RIGHT KNEE ARTHROSCOPY/ PARTIAL LATERAL MENISECTOMY/ TRICOMPARTMENT CHONDROPLASTY/ DECOMPRESSION CYST  10/26/2009   RIGHT KNEE CLOSED MANIPULATION  09/11/2010   RIGHT SHOULDER ARTHROSCOPY  2010  &  2004   TIBIA CYST REMOVED AND ORIF LEG FX  1958   TONSILLECTOMY  1968   TOTAL KNEE ARTHROPLASTY  07/16/2010  RIGHT   TOTAL KNEE ARTHROPLASTY Left 03/22/2024   Procedure: ARTHROPLASTY, KNEE, TOTAL;  Surgeon: Josefina Chew, MD;  Location: [facility];  Service: Orthopedics;  Laterality: Left;   TOTAL THYROIDECTOMY  2009   CANCER  (POST-OP BLEED)  AND RADIATION TX   trigger finger repair      07/2023   trigger fingers Right 2013   3rd and 4th fingers   UPPER GASTROINTESTINAL ENDOSCOPY     VAGINAL HYSTERECTOMY  1990   Patient Active Problem List   Diagnosis Date Noted   Pain of right thumb 02/26/2024   Precordial pain 12/23/2023   Primary osteoarthritis of left knee with subchondral insufficiency fractures 09/23/2023   History of arthroplasty of right knee 07/03/2023   Primary osteoarthritis of right shoulder 07/03/2023   Coronary artery disease involving native coronary artery of native heart without angina pectoris 05/25/2023   Agatston coronary artery  calcium  score greater than 400 11/06/2022   Statin myopathy 11/06/2022   Frequent UTI 10/29/2022   Wart of hand 09/30/2022   Acute cystitis without hematuria 05/14/2021   Medial epicondylitis, right 03/25/2021   Trigger thumb, right thumb 11/19/2020   Right wrist pain 10/23/2020   Osteoporosis 04/05/2020   Trigger finger, left middle finger 07/27/2019   Chronic neck pain 11/10/2018   Orthopedic hardware present 02/17/2018   Controlled diabetes mellitus type 2 with complications (HCC) 12/15/2017   Intractable chronic migraine without aura and with status migrainosus 12/15/2017   Arthralgia 12/15/2017   Joint pain 12/15/2017   Migraine, transformed 12/15/2017   Chronic migraine without aura, with intractable migraine, so stated, with status migrainosus 12/15/2017   Pain of left shoulder joint on movement 10/19/2017   OSA (obstructive sleep apnea) 06/26/2017   Snoring 03/25/2016   Palpitations 03/25/2016   Headache 03/25/2016   Sensory disorder of trigeminal nerve 11/26/2015   Spondylolisthesis of lumbar region 08/09/2014   Lumbar adjacent segment disease with spondylolisthesis 08/09/2014   Back pain 02/17/2014   Macular degeneration 02/17/2014   Backache 02/17/2014   Other and unspecified hyperlipidemia 10/14/2013   Hyperlipidemia 10/14/2013   Onychomycosis 12/01/2012   Arthritis 04/12/2012   Hypothyroidism 04/12/2012    PCP: Copland, MD  REFERRING PROVIDER: Josefina, MD  REFERRING DIAG: s/p Left TKA  THERAPY DIAG:  Acute pain of left knee  Stiffness of left knee, not elsewhere classified  Difficulty in walking, not elsewhere classified  Localized edema  Rationale for Evaluation and Treatment: Rehabilitation  ONSET DATE: 03/22/24  SUBJECTIVE:   SUBJECTIVE STATEMENT: Still having pain, reduced pain meds to 10mg  but the pain was still too much I went back up to 15mg .    PAIN:  Are you having pain? Yes: NPRS scale: 4/10 Pain location: left knee Pain  description: ache Aggravating factors: standing, walking 5 minutes bending pain up to 8/10 Relieving factors: propping up, ice rest, pain meds at best pain a 2/10  PRECAUTIONS: None  RED FLAGS: None   WEIGHT BEARING RESTRICTIONS: No  FALLS:  Has patient fallen in last 6 months? No  LIVING ENVIRONMENT: Lives with: lives with their family Lives in: House/apartment Stairs: Yes: Internal: 2 steps; can reach both Has following equipment at home: Single point cane, Walker - 2 wheeled, Crutches, and shower chair  OCCUPATION: retired  PLOF: Independent and shopping and housework  PATIENT GOALS: have normal walk without device, less pain  NEXT MD VISIT: 04/06/24  OBJECTIVE:  Note: Objective measures were completed at Evaluation unless otherwise noted.   COGNITION: Overall cognitive status:  Within functional limits for tasks assessed     SENSATION: WFL  EDEMA:  Circumferential: mid patella 47 cm on the left, 40 cm on the right  MUSCLE LENGTH: Tight calves and HS  POSTURE: rounded shoulders and forward head  PALPATION: Tender, ecchymosis left thigh and knee, inner and posterior  LOWER EXTREMITY ROM:  Active ROM AROM LEft eval PROM Left eval LLE 04/01/24 AROM  LLE 04/04/24  Hip flexion      Hip extension      Hip abduction      Hip adduction      Hip internal rotation      Hip external rotation      Knee flexion 65 70 82 85  Knee extension 15 9 5 5   Ankle dorsiflexion      Ankle plantarflexion      Ankle inversion      Ankle eversion       (Blank rows = not tested)  LOWER EXTREMITY MMT:  MMT Right eval Left eval  Hip flexion    Hip extension    Hip abduction    Hip adduction    Hip internal rotation    Hip external rotation    Knee flexion  2  Knee extension  2  Ankle dorsiflexion    Ankle plantarflexion    Ankle inversion    Ankle eversion     (Blank rows = not tested)  LOWER EXTREMITY SPECIAL TESTS:  Knee special tests: quad lag,  ballotable patella  FUNCTIONAL TESTS:  Timed up and go (TUG): 17 seconds  GAIT: Distance walked: 60 feet Assistive device utilized: Walker - 2 wheeled Level of assistance: Modified independence Comments: mild antalgic gait                                                                                                                                TREATMENT DATE:  04/11/24 NuStep L5x69mins  LAQ 2# 2x10 HS curls green 2x10 Gentle PROM flexion/ext, patellar mobs AROM 95d STS 2x10 Step ups 6  HEP review  04/07/24 Nustep L 4 LAQ 2 sets 10 HS curl 2 sets 10 Leg press 20# 2 sets 10 Step stretch, step tap and calf stretch on step -HEP review and answered questions. STM to the quad and ITB area Gentle PROM into flexion and ext- tolerated fair if gentle    04/04/24 Bike partial reps x 5 minutes Gait without device x 200 feet LAQ 2x10 SAQ 2x10 STM and scar mobilization to the left knee  Feet on ball K2C, rotation, small bridge and isometric abs Ball b/n knees squeeze STM to the quad and ITB area Gentle PROM into flexion she does not tolerate this well Gait without device 200 feet SBA  04/01/24 Nustep L 4 PROM flex and ext Pat mobs Gentle STW to LEft thigh LAQ 2 sets 10 Yellow tband HS curl 2 sets 10 Step stretch Step up Calf stretch on  step VASO low flat 6 min 03/30/24 Evaluation  Nustep level 3 x 5 minutes Vaso low pressure 34 degrees with elevation   PATIENT EDUCATION:  Education details: POC/HEP Person educated: Patient Education method: Explanation, Facilities manager, and Handouts Education comprehension: verbalized understanding  HOME EXERCISE PROGRAM: Access Code: [email or web address] URL: [email or web address] Date: 04/11/2024 Prepared by: Almetta Fam  Exercises - Standing Knee Flexion Stretch on Step  - 3- x daily - 7 x weekly - 1 sets - 5-10 reps - 5-10 hold - Heel Raise on Step  - 3 x daily - 7 x weekly - 1 sets - 10 reps - Standing  Gastroc Stretch on Step  - 3 x daily - 7 x weekly - 1 sets - 10 reps - Forward Step Touch  - 3 x daily - 7 x weekly - 1 sets - 10 reps - Seated Knee Flexion Slide  - 3 x daily - 7 x weekly - 1 sets - 10 reps - Sit to Stand  - 3 x daily - 7 x weekly - 1 sets - 10 reps  ASSESSMENT:  CLINICAL IMPRESSION:  Pt with ongoing pain but does see it is improving. Pt amb in without AD and slight limp. Able to get to 95d AROM for flexion. Still does not tolerate passive flexion too well. Compensations with STS, used my foot to block R foot in order to avoid sliding back and work on equal weight bearing. Step ups are difficult, heavy use of arms to pull up.     OBJECTIVE IMPAIRMENTS: Abnormal gait, cardiopulmonary status limiting activity, decreased activity tolerance, decreased balance, decreased endurance, decreased mobility, difficulty walking, decreased ROM, decreased strength, increased edema, increased muscle spasms, impaired flexibility, impaired vision/preception, improper body mechanics, postural dysfunction, and pain.   REHAB POTENTIAL: Good  CLINICAL DECISION MAKING: Evolving/moderate complexity  EVALUATION COMPLEXITY: Moderate   GOALS: Goals reviewed with patient? Yes  SHORT TERM GOALS: Target date: 04/20/24 Independent with initial HEP Baseline: Goal status: 04/01/24 MET  LONG TERM GOALS: Target date: 06/29/24  Independent with advanced HEP Baseline:  Goal status: INITIAL  2.  Independent with RICE Baseline:  Goal status: INITIAL  3.  Decrease pain 50% for better function and quality of life Baseline:  Goal status: INITIAL  4.  Decrease TUG to 12 seconds without device Baseline:  Goal status: INITIAL  5.  Increase AROM of the left knee to 4-116 degrees flexion Baseline:  Goal status: INITIAL  6.  Go up and down steps step over step Baseline:  Goal status: INITIAL   PLAN:  PT FREQUENCY: 2x/week  PT DURATION: 12 weeks  PLANNED INTERVENTIONS: 97164- PT  Re-evaluation, 97110-Therapeutic exercises, 97530- Therapeutic activity, 97112- Neuromuscular re-education, 97535- Self Care, 02859- Manual therapy, [phone]- Gait training, [phone]- Electrical stimulation (unattended), 97016- Vasopneumatic device, Patient/Family education, Balance training, Stair training, Taping, Joint mobilization, and Cryotherapy  PLAN FOR NEXT SESSION: careful with back, gain ROM and help swelling   Almetta Fam, PT 04/11/2024, 11:40 AM

## 2024-04-14 ENCOUNTER — Ambulatory Visit: Admitting: Physical Therapy

## 2024-04-14 DIAGNOSIS — M25562 Pain in left knee: Secondary | ICD-10-CM | POA: Diagnosis not present

## 2024-04-14 DIAGNOSIS — R262 Difficulty in walking, not elsewhere classified: Secondary | ICD-10-CM

## 2024-04-14 DIAGNOSIS — R600 Localized edema: Secondary | ICD-10-CM

## 2024-04-14 DIAGNOSIS — M25662 Stiffness of left knee, not elsewhere classified: Secondary | ICD-10-CM

## 2024-04-14 NOTE — Therapy (Signed)
 OUTPATIENT PHYSICAL THERAPY LOWER EXTREMITY    Patient Name: Debbie Hayes MRN: [id number] DOB:01-20-1951, 73 y.o., female Today's Date: 04/14/2024  END OF SESSION:  PT End of Session - 04/14/24 1101     Visit Number 6    Date for Recertification  06/29/24    Authorization Type BCBS medicare    PT Start Time 1100    PT Stop Time 1145    PT Time Calculation (min) 45 min           Past Medical History:  Diagnosis Date   Allergy    Asthma    Cancer (HCC)    thyroid  cancer   Cataract    Chronic migraine BOTOX INJECTION EVERY 3 MONTHS   Chronic pain in right shoulder    Constipation    Coronary artery disease    Diabetes mellitus    Disorder of inner ear CAUSES VERTIGO OCCASIONALLY   Fibromyalgia    GERD (gastroesophageal reflux disease)    Hemorrhoid    History of bladder infections    History of thyroid  cancer 2009  S/P TOTAL THYROIDECTOMY AND RADIATION   Hyperlipidemia    Hypertension CARDIOLOGIST- DR BLANCA- WILL REQUEST LATE NOTE   DENIES S & S   Hypothyroidism    Insomnia    Left shoulder pain    Macular degeneration    MRSA infection    nasal treated more than 15 years ago   Normal nuclear stress test 01/25/2009   OA (osteoarthritis) JOINTS   Osteopenia    Osteoporosis    Painful orthopaedic hardware (HCC)    left foot   Pneumonia    PONV (postoperative nausea and vomiting) SEVERE   Rotator cuff disorder LEFT SHOULDER RTC IMPINGMENT   Sleep apnea    mouth guard, no cpap    Sleep apnea in adult 06/26/2017   Spondylolisthesis of lumbar region    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   Wears glasses    Past Surgical History:  Procedure Laterality Date   BACK SURGERY     Dr Mavis- spondylosis   BILATERAL CARPAL TUNNEL RELEASE  1994   BILATERAL ELBOW SURG.  1999   BILATERAL SALPINGOOPHECTOMY  1993   POST-OP URETER REPAIR 12 DAYS AFTER    CATARACT EXTRACTION, BILATERAL     COLONOSCOPY     EYE SURGERY     FOOT ARTHRODESIS Left 07/10/2016    Procedure: LEFT 2ND TARSAL METATARSAL ARTHRODESIS  GASTROC RECESSION LEFT LAPIDUS MODIFIED MCBRIDE BUIONECTOMY;  Surgeon: Norleen Armor, MD;  Location: [facility];  Service: Orthopedics;  Laterality: Left;   GASTROC RECESSION EXTREMITY Left 07/10/2016   Procedure: GASTROC RECESSION EXTREMITY;  Surgeon: Norleen Armor, MD;  Location: [facility];  Service: Orthopedics;  Laterality: Left;   HARDWARE REMOVAL Left 05/20/2018   Procedure: Left foot removal of deep implants;  Surgeon: Armor Norleen, MD;  Location: [facility];  Service: Orthopedics;  Laterality: Left;    LEFT THUMB JOINT REPLACEMENT  2005   RIGHT DONE IN 2004   OOPHORECTOMY     POLYPECTOMY     RIGHT KNEE ARTHROSCOPY  X2  BEFORE 2011   RIGHT KNEE ARTHROSCOPY/ PARTIAL LATERAL MENISECTOMY/ TRICOMPARTMENT CHONDROPLASTY/ DECOMPRESSION CYST  10/26/2009   RIGHT KNEE CLOSED MANIPULATION  09/11/2010   RIGHT SHOULDER ARTHROSCOPY  2010  &  2004   TIBIA CYST REMOVED AND ORIF LEG FX  1958   TONSILLECTOMY  1968   TOTAL KNEE ARTHROPLASTY  07/16/2010  RIGHT   TOTAL KNEE ARTHROPLASTY Left 03/22/2024   Procedure: ARTHROPLASTY, KNEE, TOTAL;  Surgeon: Josefina Chew, MD;  Location: [facility];  Service: Orthopedics;  Laterality: Left;   TOTAL THYROIDECTOMY  2009   CANCER  (POST-OP BLEED)  AND RADIATION TX   trigger finger repair      07/2023   trigger fingers Right 2013   3rd and 4th fingers   UPPER GASTROINTESTINAL ENDOSCOPY     VAGINAL HYSTERECTOMY  1990   Patient Active Problem List   Diagnosis Date Noted   Pain of right thumb 02/26/2024   Precordial pain 12/23/2023   Primary osteoarthritis of left knee with subchondral insufficiency fractures 09/23/2023   History of arthroplasty of right knee 07/03/2023   Primary osteoarthritis of right shoulder 07/03/2023   Coronary artery disease involving native coronary artery of native heart without angina pectoris 05/25/2023   Agatston coronary artery  calcium  score greater than 400 11/06/2022   Statin myopathy 11/06/2022   Frequent UTI 10/29/2022   Wart of hand 09/30/2022   Acute cystitis without hematuria 05/14/2021   Medial epicondylitis, right 03/25/2021   Trigger thumb, right thumb 11/19/2020   Right wrist pain 10/23/2020   Osteoporosis 04/05/2020   Trigger finger, left middle finger 07/27/2019   Chronic neck pain 11/10/2018   Orthopedic hardware present 02/17/2018   Controlled diabetes mellitus type 2 with complications (HCC) 12/15/2017   Intractable chronic migraine without aura and with status migrainosus 12/15/2017   Arthralgia 12/15/2017   Joint pain 12/15/2017   Migraine, transformed 12/15/2017   Chronic migraine without aura, with intractable migraine, so stated, with status migrainosus 12/15/2017   Pain of left shoulder joint on movement 10/19/2017   OSA (obstructive sleep apnea) 06/26/2017   Snoring 03/25/2016   Palpitations 03/25/2016   Headache 03/25/2016   Sensory disorder of trigeminal nerve 11/26/2015   Spondylolisthesis of lumbar region 08/09/2014   Lumbar adjacent segment disease with spondylolisthesis 08/09/2014   Back pain 02/17/2014   Macular degeneration 02/17/2014   Backache 02/17/2014   Other and unspecified hyperlipidemia 10/14/2013   Hyperlipidemia 10/14/2013   Onychomycosis 12/01/2012   Arthritis 04/12/2012   Hypothyroidism 04/12/2012    PCP: Copland, MD  REFERRING PROVIDER: Josefina, MD  REFERRING DIAG: s/p Left TKA  THERAPY DIAG:  Acute pain of left knee  Stiffness of left knee, not elsewhere classified  Difficulty in walking, not elsewhere classified  Localized edema  Rationale for Evaluation and Treatment: Rehabilitation  ONSET DATE: 03/22/24  SUBJECTIVE:   SUBJECTIVE STATEMENT: Doing okay, just sore    PAIN:  Are you having pain? Yes: NPRS scale: 4/10 Pain location: left knee Pain description: ache Aggravating factors: standing, walking 5 minutes bending pain up to  8/10 Relieving factors: propping up, ice rest, pain meds at best pain a 2/10  PRECAUTIONS: None  RED FLAGS: None   WEIGHT BEARING RESTRICTIONS: No  FALLS:  Has patient fallen in last 6 months? No  LIVING ENVIRONMENT: Lives with: lives with their family Lives in: House/apartment Stairs: Yes: Internal: 2 steps; can reach both Has following equipment at home: Single point cane, Walker - 2 wheeled, Crutches, and shower chair  OCCUPATION: retired  PLOF: Independent and shopping and housework  PATIENT GOALS: have normal walk without device, less pain  NEXT MD VISIT: 04/06/24  OBJECTIVE:  Note: Objective measures were completed at Evaluation unless otherwise noted.   COGNITION: Overall cognitive status: Within functional limits for tasks assessed     SENSATION: WFL  EDEMA:  Circumferential: mid  patella 47 cm on the left, 40 cm on the right  MUSCLE LENGTH: Tight calves and HS  POSTURE: rounded shoulders and forward head  PALPATION: Tender, ecchymosis left thigh and knee, inner and posterior  LOWER EXTREMITY ROM:  Active ROM AROM LEft eval PROM Left eval LLE 04/01/24 AROM  LLE 04/04/24  Hip flexion      Hip extension      Hip abduction      Hip adduction      Hip internal rotation      Hip external rotation      Knee flexion 65 70 82 85  Knee extension 15 9 5 5   Ankle dorsiflexion      Ankle plantarflexion      Ankle inversion      Ankle eversion       (Blank rows = not tested)  LOWER EXTREMITY MMT:  MMT Right eval Left eval  Hip flexion    Hip extension    Hip abduction    Hip adduction    Hip internal rotation    Hip external rotation    Knee flexion  2  Knee extension  2  Ankle dorsiflexion    Ankle plantarflexion    Ankle inversion    Ankle eversion     (Blank rows = not tested)  LOWER EXTREMITY SPECIAL TESTS:  Knee special tests: quad lag, ballotable patella  FUNCTIONAL TESTS:  Timed up and go (TUG): 17 seconds  GAIT: Distance  walked: 60 feet Assistive device utilized: Walker - 2 wheeled Level of assistance: Modified independence Comments: mild antalgic gait                                                                                                                                TREATMENT DATE:   04/14/24 Nustep L 5 LE only LAQ 2# 2 sets 10 HS curls red tband 2 sets 10 ( decreased to red to increase ROM) SAQ 10 x TKE with manual resistance 10 x 6 inch step up fwd and laterally 10 x each PROM flex and ext AROM sitting EOB  2-100 Resisted squats 2 sets 10 green tband Leg press 20 # 2 sets 10    04/11/24 NuStep L5x57mins  LAQ 2# 2x10 HS curls green 2x10 Gentle PROM flexion/ext, patellar mobs AROM 95d STS 2x10 Step ups 6  HEP review  04/07/24 Nustep L 4 LAQ 2 sets 10 HS curl 2 sets 10 Leg press 20# 2 sets 10 Step stretch, step tap and calf stretch on step -HEP review and answered questions. STM to the quad and ITB area Gentle PROM into flexion and ext- tolerated fair if gentle    04/04/24 Bike partial reps x 5 minutes Gait without device x 200 feet LAQ 2x10 SAQ 2x10 STM and scar mobilization to the left knee  Feet on ball K2C, rotation, small bridge and isometric abs Ball b/n knees squeeze STM to the  quad and ITB area Gentle PROM into flexion she does not tolerate this well Gait without device 200 feet SBA  04/01/24 Nustep L 4 PROM flex and ext Pat mobs Gentle STW to LEft thigh LAQ 2 sets 10 Yellow tband HS curl 2 sets 10 Step stretch Step up Calf stretch on step VASO low flat 6 min 03/30/24 Evaluation  Nustep level 3 x 5 minutes Vaso low pressure 34 degrees with elevation   PATIENT EDUCATION:  Education details: POC/HEP Person educated: Patient Education method: Explanation, Facilities manager, and Handouts Education comprehension: verbalized understanding  HOME EXERCISE PROGRAM: Access Code: [email or web address] URL: [email or web address] Date:  04/11/2024 Prepared by: Almetta Fam  Exercises - Standing Knee Flexion Stretch on Step  - 3- x daily - 7 x weekly - 1 sets - 5-10 reps - 5-10 hold - Heel Raise on Step  - 3 x daily - 7 x weekly - 1 sets - 10 reps - Standing Gastroc Stretch on Step  - 3 x daily - 7 x weekly - 1 sets - 10 reps - Forward Step Touch  - 3 x daily - 7 x weekly - 1 sets - 10 reps - Seated Knee Flexion Slide  - 3 x daily - 7 x weekly - 1 sets - 10 reps - Sit to Stand  - 3 x daily - 7 x weekly - 1 sets - 10 reps  ASSESSMENT:  CLINICAL IMPRESSION:  ROM 2-100, 5 degrees higher than earlier in week. Less compensation with step ups , less arm pull but still tends to circumduct to get leg up. Cued with squats and STS = wt distribution as she leans off surgical leg   OBJECTIVE IMPAIRMENTS: Abnormal gait, cardiopulmonary status limiting activity, decreased activity tolerance, decreased balance, decreased endurance, decreased mobility, difficulty walking, decreased ROM, decreased strength, increased edema, increased muscle spasms, impaired flexibility, impaired vision/preception, improper body mechanics, postural dysfunction, and pain.   REHAB POTENTIAL: Good  CLINICAL DECISION MAKING: Evolving/moderate complexity  EVALUATION COMPLEXITY: Moderate   GOALS: Goals reviewed with patient? Yes  SHORT TERM GOALS: Target date: 04/20/24 Independent with initial HEP Baseline: Goal status: 04/01/24 MET  LONG TERM GOALS: Target date: 06/29/24  Independent with advanced HEP Baseline:  Goal status: progressing 04/14/24  2.  Independent with RICE Baseline:  Goal status: MET 04/14/24  3.  Decrease pain 50% for better function and quality of life Baseline:  Goal status: progressing 04/14/24  4.  Decrease TUG to 12 seconds without device Baseline:  Goal status: INITIAL  5.  Increase AROM of the left knee to 4-116 degrees flexion Baseline:  Goal status: 04/14/24 2-100 progressing  6.  Go up and down steps step over  step Baseline:  Goal status: 04/14/24 progressing   PLAN:  PT FREQUENCY: 2x/week  PT DURATION: 12 weeks  PLANNED INTERVENTIONS: 97164- PT Re-evaluation, 97110-Therapeutic exercises, 97530- Therapeutic activity, 97112- Neuromuscular re-education, 97535- Self Care, 02859- Manual therapy, [phone]- Gait training, [phone]- Electrical stimulation (unattended), 97016- Vasopneumatic device, Patient/Family education, Balance training, Stair training, Taping, Joint mobilization, and Cryotherapy  PLAN FOR NEXT SESSION: careful with back, gain ROM and strength   Ingri Diemer,ANGIE, PTA 04/14/2024, 11:02 AM

## 2024-04-19 ENCOUNTER — Ambulatory Visit: Admitting: Physical Therapy

## 2024-04-19 DIAGNOSIS — M25562 Pain in left knee: Secondary | ICD-10-CM

## 2024-04-19 DIAGNOSIS — R262 Difficulty in walking, not elsewhere classified: Secondary | ICD-10-CM

## 2024-04-19 DIAGNOSIS — M25662 Stiffness of left knee, not elsewhere classified: Secondary | ICD-10-CM

## 2024-04-19 NOTE — Therapy (Signed)
 OUTPATIENT PHYSICAL THERAPY LOWER EXTREMITY    Patient Name: Debbie Hayes MRN: [id number] DOB:Feb 02, 1951, 73 y.o., female Today's Date: 04/19/2024  END OF SESSION:  PT End of Session - 04/19/24 1310     Visit Number 7    Date for Recertification  06/29/24    Authorization Type [employer] medicare    PT Start Time 1310    PT Stop Time 1353    PT Time Calculation (min) 43 min           Past Medical History:  Diagnosis Date   Allergy    Asthma    Cancer (HCC)    thyroid  cancer   Cataract    Chronic migraine BOTOX INJECTION EVERY 3 MONTHS   Chronic pain in right shoulder    Constipation    Coronary artery disease    Diabetes mellitus    Disorder of inner ear CAUSES VERTIGO OCCASIONALLY   Fibromyalgia    GERD (gastroesophageal reflux disease)    Hemorrhoid    History of bladder infections    History of thyroid  cancer 2009  S/P TOTAL THYROIDECTOMY AND RADIATION   Hyperlipidemia    Hypertension CARDIOLOGIST- DR BLANCA- WILL REQUEST LATE NOTE   DENIES S & S   Hypothyroidism    Insomnia    Left shoulder pain    Macular degeneration    MRSA infection    nasal treated more than 15 years ago   Normal nuclear stress test 01/25/2009   OA (osteoarthritis) JOINTS   Osteopenia    Osteoporosis    Painful orthopaedic hardware    left foot   Pneumonia    PONV (postoperative nausea and vomiting) SEVERE   Rotator cuff disorder LEFT SHOULDER RTC IMPINGMENT   Sleep apnea    mouth guard, no cpap    Sleep apnea in adult 06/26/2017   Spondylolisthesis of lumbar region    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   Wears glasses    Past Surgical History:  Procedure Laterality Date   BACK SURGERY     Dr Mavis- spondylosis   BILATERAL CARPAL TUNNEL RELEASE  1994   BILATERAL ELBOW SURG.  1999   BILATERAL SALPINGOOPHECTOMY  1993   POST-OP URETER REPAIR 12 DAYS AFTER    CATARACT EXTRACTION, BILATERAL     COLONOSCOPY     EYE SURGERY     FOOT ARTHRODESIS Left 07/10/2016    Procedure: LEFT 2ND TARSAL METATARSAL ARTHRODESIS  GASTROC RECESSION LEFT [place]IDUS MODIFIED MCBRIDE BUIONECTOMY;  Surgeon: Norleen Armor, MD;  Location: [facility];  Service: Orthopedics;  Laterality: Left;   GASTROC RECESSION EXTREMITY Left 07/10/2016   Procedure: GASTROC RECESSION EXTREMITY;  Surgeon: Norleen Armor, MD;  Location: [facility];  Service: Orthopedics;  Laterality: Left;   HARDWARE REMOVAL Left 05/20/2018   Procedure: Left foot removal of deep implants;  Surgeon: Armor Norleen, MD;  Location: [facility];  Service: Orthopedics;  Laterality: Left;    LEFT THUMB JOINT REPLACEMENT  2005   RIGHT DONE IN 2004   OOPHORECTOMY     POLYPECTOMY     RIGHT KNEE ARTHROSCOPY  X2  BEFORE 2011   RIGHT KNEE ARTHROSCOPY/ PARTIAL LATERAL MENISECTOMY/ TRICOMPARTMENT CHONDROPLASTY/ DECOMPRESSION CYST  10/26/2009   RIGHT KNEE CLOSED MANIPULATION  09/11/2010   RIGHT SHOULDER ARTHROSCOPY  2010  &  2004   TIBIA CYST REMOVED AND ORIF LEG FX  1958   TONSILLECTOMY  1968   TOTAL KNEE ARTHROPLASTY  07/16/2010   RIGHT  TOTAL KNEE ARTHROPLASTY Left 03/22/2024   Procedure: ARTHROPLASTY, KNEE, TOTAL;  Surgeon: Josefina Chew, MD;  Location: [facility];  Service: Orthopedics;  Laterality: Left;   TOTAL THYROIDECTOMY  2009   CANCER  (POST-OP BLEED)  AND RADIATION TX   trigger finger repair      07/2023   trigger fingers Right 2013   3rd and 4th fingers   UPPER GASTROINTESTINAL ENDOSCOPY     VAGINAL HYSTERECTOMY  1990   Patient Active Problem List   Diagnosis Date Noted   Pain of right thumb 02/26/2024   Precordial pain 12/23/2023   Primary osteoarthritis of left knee with subchondral insufficiency fractures 09/23/2023   History of arthroplasty of right knee 07/03/2023   Primary osteoarthritis of right shoulder 07/03/2023   Coronary artery disease involving native coronary artery of native heart without angina pectoris 05/25/2023   Agatston coronary artery calcium   score greater than 400 11/06/2022   Statin myopathy 11/06/2022   Frequent UTI 10/29/2022   Wart of hand 09/30/2022   Acute cystitis without hematuria 05/14/2021   Medial epicondylitis, right 03/25/2021   Trigger thumb, right thumb 11/19/2020   Right wrist pain 10/23/2020   Osteoporosis 04/05/2020   Trigger finger, left middle finger 07/27/2019   Chronic neck pain 11/10/2018   Orthopedic hardware present 02/17/2018   Controlled diabetes mellitus type 2 with complications (HCC) 12/15/2017   Intractable chronic migraine without aura and with status migrainosus 12/15/2017   Arthralgia 12/15/2017   Joint pain 12/15/2017   Migraine, transformed 12/15/2017   Chronic migraine without aura, with intractable migraine, so stated, with status migrainosus 12/15/2017   Pain of left shoulder joint on movement 10/19/2017   OSA (obstructive sleep apnea) 06/26/2017   Snoring 03/25/2016   Palpitations 03/25/2016   Headache 03/25/2016   Sensory disorder of trigeminal nerve 11/26/2015   Spondylolisthesis of lumbar region 08/09/2014   Lumbar adjacent segment disease with spondylolisthesis 08/09/2014   Back pain 02/17/2014   Macular degeneration 02/17/2014   Backache 02/17/2014   Other and unspecified hyperlipidemia 10/14/2013   Hyperlipidemia 10/14/2013   Onychomycosis 12/01/2012   Arthritis 04/12/2012   Hypothyroidism 04/12/2012    PCP: Watt, MD  REFERRING PROVIDER: Josefina, MD  REFERRING DIAG: s/p Left TKA  THERAPY DIAG:  Acute pain of left knee  Stiffness of left knee, not elsewhere classified  Difficulty in walking, not elsewhere classified  Rationale for Evaluation and Treatment: Rehabilitation  ONSET DATE: 03/22/24  SUBJECTIVE:   SUBJECTIVE STATEMENT: Doing better. Sewed an hour yesterday   PAIN:  Are you having pain? Left knee 5-6/10 PRECAUTIONS: None  RED FLAGS: None   WEIGHT BEARING RESTRICTIONS: No  FALLS:  Has patient fallen in last 6 months? No  LIVING  ENVIRONMENT: Lives with: lives with their family Lives in: House/apartment Stairs: Yes: Internal: 2 steps; can reach both Has following equipment at home: Single point cane, Walker - 2 wheeled, Crutches, and shower chair  OCCUPATION: retired  PLOF: Independent and shopping and housework  PATIENT GOALS: have normal walk without device, less pain  NEXT MD VISIT: 04/06/24  OBJECTIVE:  Note: Objective measures were completed at Evaluation unless otherwise noted.   COGNITION: Overall cognitive status: Within functional limits for tasks assessed     SENSATION: WFL  EDEMA:  Circumferential: mid patella 47 cm on the left, 40 cm on the right  MUSCLE LENGTH: Tight calves and HS  POSTURE: rounded shoulders and forward head  PALPATION: Tender, ecchymosis left thigh and knee, inner and posterior  LOWER  EXTREMITY ROM:  Active ROM AROM LEft eval PROM Left eval LLE 04/01/24 AROM  LLE 04/04/24  Hip flexion      Hip extension      Hip abduction      Hip adduction      Hip internal rotation      Hip external rotation      Knee flexion 65 70 82 85  Knee extension 15 9 5 5   Ankle dorsiflexion      Ankle plantarflexion      Ankle inversion      Ankle eversion       (Blank rows = not tested)  LOWER EXTREMITY MMT:  MMT Right eval Left eval  Hip flexion    Hip extension    Hip abduction    Hip adduction    Hip internal rotation    Hip external rotation    Knee flexion  2  Knee extension  2  Ankle dorsiflexion    Ankle plantarflexion    Ankle inversion    Ankle eversion     (Blank rows = not tested)  LOWER EXTREMITY SPECIAL TESTS:  Knee special tests: quad lag, ballotable patella  FUNCTIONAL TESTS:  Timed up and go (TUG): 17 seconds  GAIT: Distance walked: 60 feet Assistive device utilized: Environmental consultant - 2 wheeled Level of assistance: Modified independence Comments: mild antalgic gait                                                                                                                                 TREATMENT DATE:   04/19/24 Nustep L 5 LE only Green tband HS curl 2 sets 10 3# LAQ 2 sets 10 3# standing HS curl 2 sets 10 3# 8 in step tap 2 sets 10 6 inch step up fwd and laterally 10 x each Green tband TKE 2 sets 10 PROM flex and ext AROM after 0-103 Leg Press 30# 2 sets 10 Black bar DF and PF 15 x STS wt ball 10 x Resisted gait fwd,back and side ways 20#   04/14/24 Nustep L 5 LE only LAQ 2# 2 sets 10 HS curls red tband 2 sets 10 ( decreased to red to increase ROM) SAQ 10 x TKE with manual resistance 10 x 6 inch step up fwd and laterally 10 x each PROM flex and ext AROM sitting EOB  2-100 Resisted squats 2 sets 10 green tband Leg press 20 # 2 sets 10    04/11/24 NuStep L5x100mins  LAQ 2# 2x10 HS curls green 2x10 Gentle PROM flexion/ext, patellar mobs AROM 95d STS 2x10 Step ups 6  HEP review  04/07/24 Nustep L 4 LAQ 2 sets 10 HS curl 2 sets 10 Leg press 20# 2 sets 10 Step stretch, step tap and calf stretch on step -HEP review and answered questions. STM to the quad and ITB area Gentle PROM into flexion and ext- tolerated fair if gentle  04/04/24 Bike partial reps x 5 minutes Gait without device x 200 feet LAQ 2x10 SAQ 2x10 STM and scar mobilization to the left knee  Feet on ball K2C, rotation, small bridge and isometric abs Ball b/n knees squeeze STM to the quad and ITB area Gentle PROM into flexion she does not tolerate this well Gait without device 200 feet SBA  04/01/24 Nustep L 4 PROM flex and ext Pat mobs Gentle STW to LEft thigh LAQ 2 sets 10 Yellow tband HS curl 2 sets 10 Step stretch Step up Calf stretch on step VASO low flat 6 min 03/30/24 Evaluation  Nustep level 3 x 5 minutes Vaso low pressure 34 degrees with elevation   PATIENT EDUCATION:  Education details: POC/HEP Person educated: Patient Education method: Explanation, Facilities manager, and Handouts Education  comprehension: verbalized understanding  HOME EXERCISE PROGRAM: Access Code: [email or web address] URL: [email or web address] Date: 04/11/2024 Prepared by: Almetta Fam  Exercises - Standing Knee Flexion Stretch on Step  - 3- x daily - 7 x weekly - 1 sets - 5-10 reps - 5-10 hold - Heel Raise on Step  - 3 x daily - 7 x weekly - 1 sets - 10 reps - Standing Gastroc Stretch on Step  - 3 x daily - 7 x weekly - 1 sets - 10 reps - Forward Step Touch  - 3 x daily - 7 x weekly - 1 sets - 10 reps - Seated Knee Flexion Slide  - 3 x daily - 7 x weekly - 1 sets - 10 reps - Sit to Stand  - 3 x daily - 7 x weekly - 1 sets - 10 reps  ASSESSMENT:  CLINICAL IMPRESSION:  ROM 0-103, added additional resistance and ex today for strength and ROM with cuing. Pt is progressing nicely and showing less compensations and she gains more ROM and less pain OBJECTIVE IMPAIRMENTS: Abnormal gait, cardiopulmonary status limiting activity, decreased activity tolerance, decreased balance, decreased endurance, decreased mobility, difficulty walking, decreased ROM, decreased strength, increased edema, increased muscle spasms, impaired flexibility, impaired vision/preception, improper body mechanics, postural dysfunction, and pain.   REHAB POTENTIAL: Good  CLINICAL DECISION MAKING: Evolving/moderate complexity  EVALUATION COMPLEXITY: Moderate   GOALS: Goals reviewed with patient? Yes  SHORT TERM GOALS: Target date: 04/20/24 Independent with initial HEP Baseline: Goal status: 04/01/24 MET  LONG TERM GOALS: Target date: 06/29/24  Independent with advanced HEP Baseline:  Goal status: progressing 04/14/24  2.  Independent with RICE Baseline:  Goal status: MET 04/14/24  3.  Decrease pain 50% for better function and quality of life Baseline:  Goal status: progressing 04/14/24  4.  Decrease TUG to 12 seconds without device Baseline:  Goal status: INITIAL  5.  Increase AROM of the left knee to 4-116 degrees  flexion Baseline:  Goal status: 04/14/24 2-100 progressing  6.  Go up and down steps step over step Baseline:  Goal status: 04/14/24 progressing   PLAN:  PT FREQUENCY: 2x/week  PT DURATION: 12 weeks  PLANNED INTERVENTIONS: 97164- PT Re-evaluation, 97110-Therapeutic exercises, 97530- Therapeutic activity, 97112- Neuromuscular re-education, 97535- Self Care, [id number]- Manual therapy, [phone]- Gait training, [phone]- Electrical stimulation (unattended), 97016- Vasopneumatic device, Patient/Family education, Balance training, Stair training, Taping, Joint mobilization, and Cryotherapy  PLAN FOR NEXT SESSION: careful with back, gain ROM and strength   Myndi Wamble,ANGIE, PTA 04/19/2024, 1:52 PM

## 2024-04-20 ENCOUNTER — Ambulatory Visit
Admission: EM | Admit: 2024-04-20 | Discharge: 2024-04-20 | Disposition: A | Discharge status: Home / Self Care | Attending: Family Medicine | Admitting: Family Medicine

## 2024-04-20 ENCOUNTER — Ambulatory Visit: Payer: Self-pay

## 2024-04-20 DIAGNOSIS — R2242 Localized swelling, mass and lump, left lower limb: Secondary | ICD-10-CM | POA: Diagnosis not present

## 2024-04-20 DIAGNOSIS — L539 Erythematous condition, unspecified: Secondary | ICD-10-CM | POA: Diagnosis not present

## 2024-04-20 MED ORDER — CEPHALEXIN 500 MG PO CAPS: 500.0000 mg | ORAL_CAPSULE | Freq: Four times a day (QID) | ORAL | 0 refills | Status: AC

## 2024-04-20 NOTE — Telephone Encounter (Signed)
 Left Knee Replacement 4 weeks ago Top of left foot swollen, red, inflammed now since yesterday Patient advised the recommendation is to be seen and evaluated today within the next four hours No availability in PCP office or region surrounding PCP office Patient advised Urgent Care or the Emergency Room if it gets worse She states she will wait for a call back from her PCP and wants an appointment tomorrow if her PCP recommends     FYI Only or Action Required?: Action required by provider: clinical question for provider and update on patient condition.  Patient was last seen in primary care on 02/26/2024 by Curtis Debby PARAS, MD.  Called Nurse Triage reporting Foot Swelling.  Symptoms began yesterday.  Interventions attempted: Rest, hydration, or home remedies.  Symptoms are: unchanged.  Triage Disposition: See HCP Within 4 Hours (Or PCP Triage)  Patient/caregiver understands and will follow disposition?: No, wishes to speak with PCP                Copied from CRM #[id number]. Topic: Clinical - Red Word Triage >> Apr 20, 2024  2:23 PM Jasmin G wrote: Red Word that prompted transfer to Nurse Triage: Pt states that she had knee surgery  4 weeks ago and her knee is currently red, inflamed and swollen. Reason for Disposition  [1] Thigh, calf, or ankle swelling AND [2] only 1 side  Answer Assessment - Initial Assessment Questions Left Knee Replacement 4 weeks ago Top of left foot swollen, red, inflammed now since yesterday Patient advised the recommendation is to be seen and evaluated today within the next four hours No availability in PCP office or region surrounding PCP office Patient advised Urgent Care or the Emergency Room if it gets worse She states she will wait for a call back from her PCP and wants an appointment tomorrow if her PCP recommends  1. ONSET: When did the swelling start? (e.g., minutes, hours, days)     yesterday 2. LOCATION: What part of the  leg is swollen?  Are both legs swollen or just one leg?     Top of left foot 3. SEVERITY: How bad is the swelling? (e.g., localized; mild, moderate, severe)     ---- 4. REDNESS: Is there redness or signs of infection?     Redness, inflammation  seems mottled 5. PAIN: Is the swelling painful to touch? If Yes, ask: How painful is it?   (Scale 1-10; mild, moderate or severe)     Hurt some yesterday and tender to touch 6. FEVER: Do you have a fever? If Yes, ask: What is it, how was it measured, and when did it start?      no 7. CAUSE: What do you think is causing the leg swelling?     unsure 8. MEDICAL HISTORY: Do you have a history of blood clots (e.g., DVT), cancer, heart failure, kidney disease, or liver failure?     ----- 9. RECURRENT SYMPTOM: Have you had leg swelling before? If Yes, ask: When was the last time? What happened that time?     ------ 10. OTHER SYMPTOMS: Do you have any other symptoms? (e.g., chest pain, difficulty breathing)       Patient denies  Protocols used: Leg Swelling and Edema-A-AH

## 2024-04-20 NOTE — Telephone Encounter (Signed)
 Called her back- LMOM.  Please return my call when she can

## 2024-04-20 NOTE — ED Provider Notes (Signed)
 [facility]    CSN: [id number] Arrival date & time: 04/20/24  1554      History   Chief Complaint Chief Complaint  Patient presents with   Foot Pain   Foot Swelling    HPI Debbie Hayes is a 73 y.o. female presents for foot swelling.  Patient reports yesterday she noticed the top of her left foot was red and slightly warm.  Today she noticed it was swollen.  She does have some tenderness to the area.  Denies any injury.  No fevers chills chest pain or shortness of breath.  No calf pain or redness.  She did recently have a left TKA 4 weeks ago and has been going to physical therapy.  States she has been recovering well.  no known history of DVT.  Patient is concerned for either DVT or infection.   Foot Pain    Past Medical History:  Diagnosis Date   Allergy    Asthma    Cancer (HCC)    thyroid  cancer   Cataract    Chronic migraine BOTOX INJECTION EVERY 3 MONTHS   Chronic pain in right shoulder    Constipation    Coronary artery disease    Diabetes mellitus    Disorder of inner ear CAUSES VERTIGO OCCASIONALLY   Fibromyalgia    GERD (gastroesophageal reflux disease)    Hemorrhoid    History of bladder infections    History of thyroid  cancer 2009  S/P TOTAL THYROIDECTOMY AND RADIATION   Hyperlipidemia    Hypertension CARDIOLOGIST- DR BLANCA- WILL REQUEST LATE NOTE   DENIES S & S   Hypothyroidism    Insomnia    Left shoulder pain    Macular degeneration    MRSA infection    nasal treated more than 15 years ago   Normal nuclear stress test 01/25/2009   OA (osteoarthritis) JOINTS   Osteopenia    Osteoporosis    Painful orthopaedic hardware    left foot   Pneumonia    PONV (postoperative nausea and vomiting) SEVERE   Rotator cuff disorder LEFT SHOULDER RTC IMPINGMENT   Sleep apnea    mouth guard, no cpap    Sleep apnea in adult 06/26/2017   Spondylolisthesis of lumbar region    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   Wears glasses      Patient Active Problem List   Diagnosis Date Noted   Pain of right thumb 02/26/2024   Precordial pain 12/23/2023   Primary osteoarthritis of left knee with subchondral insufficiency fractures 09/23/2023   History of arthroplasty of right knee 07/03/2023   Primary osteoarthritis of right shoulder 07/03/2023   Coronary artery disease involving native coronary artery of native heart without angina pectoris 05/25/2023   Agatston coronary artery calcium  score greater than 400 11/06/2022   Statin myopathy 11/06/2022   Frequent UTI 10/29/2022   Wart of hand 09/30/2022   Acute cystitis without hematuria 05/14/2021   Medial epicondylitis, right 03/25/2021   Trigger thumb, right thumb 11/19/2020   Right wrist pain 10/23/2020   Osteoporosis 04/05/2020   Trigger finger, left middle finger 07/27/2019   Chronic neck pain 11/10/2018   Orthopedic hardware present 02/17/2018   Controlled diabetes mellitus type 2 with complications (HCC) 12/15/2017   Intractable chronic migraine without aura and with status migrainosus 12/15/2017   Arthralgia 12/15/2017   Joint pain 12/15/2017   Migraine, transformed 12/15/2017   Chronic migraine without aura, with intractable migraine, so stated, with status migrainosus 12/15/2017  Pain of left shoulder joint on movement 10/19/2017   OSA (obstructive sleep apnea) 06/26/2017   Snoring 03/25/2016   Palpitations 03/25/2016   Headache 03/25/2016   Sensory disorder of trigeminal nerve 11/26/2015   Spondylolisthesis of lumbar region 08/09/2014   Lumbar adjacent segment disease with spondylolisthesis 08/09/2014   Back pain 02/17/2014   Macular degeneration 02/17/2014   Backache 02/17/2014   Other and unspecified hyperlipidemia 10/14/2013   Hyperlipidemia 10/14/2013   Onychomycosis 12/01/2012   Arthritis 04/12/2012   Hypothyroidism 04/12/2012    Past Surgical History:  Procedure Laterality Date   BACK SURGERY     Dr Mavis- spondylosis   BILATERAL  CARPAL TUNNEL RELEASE  1994   BILATERAL ELBOW SURG.  1999   BILATERAL SALPINGOOPHECTOMY  1993   POST-OP URETER REPAIR 12 DAYS AFTER    CATARACT EXTRACTION, BILATERAL     COLONOSCOPY     EYE SURGERY     FOOT ARTHRODESIS Left 07/10/2016   Procedure: LEFT 2ND TARSAL METATARSAL ARTHRODESIS  GASTROC RECESSION LEFT LAPIDUS MODIFIED MCBRIDE BUIONECTOMY;  Surgeon: Norleen Armor, MD;  Location: [facility];  Service: Orthopedics;  Laterality: Left;   GASTROC RECESSION EXTREMITY Left 07/10/2016   Procedure: GASTROC RECESSION EXTREMITY;  Surgeon: Norleen Armor, MD;  Location: [facility];  Service: Orthopedics;  Laterality: Left;   HARDWARE REMOVAL Left 05/20/2018   Procedure: Left foot removal of deep implants;  Surgeon: Armor Norleen, MD;  Location: [facility];  Service: Orthopedics;  Laterality: Left;    LEFT THUMB JOINT REPLACEMENT  2005   RIGHT DONE IN 2004   OOPHORECTOMY     POLYPECTOMY     RIGHT KNEE ARTHROSCOPY  X2  BEFORE 2011   RIGHT KNEE ARTHROSCOPY/ PARTIAL LATERAL MENISECTOMY/ TRICOMPARTMENT CHONDROPLASTY/ DECOMPRESSION CYST  10/26/2009   RIGHT KNEE CLOSED MANIPULATION  09/11/2010   RIGHT SHOULDER ARTHROSCOPY  2010  &  2004   TIBIA CYST REMOVED AND ORIF LEG FX  1958   TONSILLECTOMY  1968   TOTAL KNEE ARTHROPLASTY  07/16/2010   RIGHT   TOTAL KNEE ARTHROPLASTY Left 03/22/2024   Procedure: ARTHROPLASTY, KNEE, TOTAL;  Surgeon: Josefina Chew, MD;  Location: [facility];  Service: Orthopedics;  Laterality: Left;   TOTAL THYROIDECTOMY  2009   CANCER  (POST-OP BLEED)  AND RADIATION TX   trigger finger repair      07/2023   trigger fingers Right 2013   3rd and 4th fingers   UPPER GASTROINTESTINAL ENDOSCOPY     VAGINAL HYSTERECTOMY  1990    OB History   No obstetric history on file.      Home Medications    Prior to Admission medications   Medication Sig Start Date End Date Taking? Authorizing Provider  cephALEXin  (KEFLEX ) 500 MG capsule  Take 1 capsule (500 mg total) by mouth 4 (four) times daily for 7 days. 04/20/24 04/27/24 Yes Hildegard Hlavac, Jodi R, NP  acetaminophen  (TYLENOL ) 500 MG tablet Take 500 mg by mouth every 6 (six) hours as needed for moderate pain (pain score 4-6).    [provider]  Albuterol  Sulfate (PROAIR  RESPICLICK) 108 (90 Base) MCG/ACT AEPB Inhale 2 puffs every 4-6 hours as needed for wheezing or shortness of breath 07/16/23   Copland, Harlene BROCKS, MD  alendronate  (FOSAMAX ) 70 MG tablet TAKE 1 TABLET(70 MG) BY MOUTH EVERY 7 DAYS WITH A FULL GLASS OF WATER  AND ON AN EMPTY STOMACH 12/14/23   Copland, Harlene BROCKS, MD  Atogepant  (QULIPTA ) 60 MG TABS Take  1 tablet (60 mg total) by mouth daily. 10/05/23   Ines Onetha NOVAK, MD  blood glucose meter kit and supplies KIT Test blood sugar once daily. Dx code: E11.9 03/08/15   Humberto Elspeth LABOR, MD  Blood Glucose Monitoring Suppl (FREESTYLE LITE) DEVI Check blood sugar no more than twice daily 09/23/17   Copland, Jessica C, MD  Blood Glucose Monitoring Suppl (ONE TOUCH ULTRA 2) w/Device KIT Dispense one meter and test strips, lancets- 100 of each with PRN RF 09/25/22   Copland, Harlene BROCKS, MD  chlorhexidine  (HIBICLENS ) 4 % external liquid Apply 15 mLs (1 Application total) topically as directed for 30 doses. Use as directed daily for 5 days every other week for 6 weeks. 03/22/24   Brown, Blaine K, PA-C  clopidogrel  (PLAVIX ) 75 MG tablet Take 1 tablet (75 mg total) by mouth daily. 11/05/23   Patwardhan, Newman PARAS, MD  Cranberry 4200 MG CAPS Take 4,200 mg by mouth daily.    [provider]  dicyclomine  (BENTYL ) 20 MG tablet Take 1 tablet (20 mg total) by mouth 4 (four) times daily as needed for spasms. 05/12/23   Copland, Harlene BROCKS, MD  estradiol  (ESTRACE ) 0.1 MG/GM vaginal cream Place 1 Applicatorful vaginally 3 (three) times a week. Patient taking differently: Place 1 Applicatorful vaginally 2 (two) times a week. 11/25/23   Stoneking, Adine PARAS., MD  Evolocumab  (REPATHA  SURECLICK) 140  MG/ML SOAJ Inject 140 mg into the skin every 14 (fourteen) days. 03/21/24   Copland, Harlene BROCKS, MD  famotidine  (PEPCID ) 40 MG tablet Take 40 mg by mouth daily.    [provider]  Fexofenadine HCl (ALLEGRA PO) Take 180 mg by mouth daily.    [provider]  fluticasone  (FLOVENT  HFA) 110 MCG/ACT inhaler Inhale 1-2 puffs into the lungs 2 (two) times daily. 08/14/23   Copland, Harlene BROCKS, MD  glucose blood (FREESTYLE LITE) test strip CHECK BLOOD SUGARS NO MORE THAN TWICE DAILY 10/26/18   Copland, Harlene BROCKS, MD  Guaifenesin (MUCINEX MAXIMUM STRENGTH) 1200 MG TB12 Take 1,200 mg by mouth daily as needed (congestion).    [provider]  HYDROmorphone  (DILAUDID ) 2 MG tablet Take 1 tablet (2 mg total) by mouth every 4 (four) hours as needed (For Breakthrough pain not controlled by baseline oxycodone ). 03/22/24   Brown, Blaine K, PA-C  Lancets (FREESTYLE) lancets Check blood sugars no more than twice daily 09/23/17   Copland, Harlene BROCKS, MD  levothyroxine  (SYNTHROID ) 125 MCG tablet TAKE 1 TABLET BY MOUTH BEFORE BREAKFAST 02/09/24   Copland, Harlene BROCKS, MD  losartan  (COZAAR ) 25 MG tablet Take 1 tablet (25 mg total) by mouth daily. 04/05/24   Copland, Jessica C, MD  Magnesium 400 MG CAPS Take 800 mg by mouth daily.    [provider]  metFORMIN  (GLUCOPHAGE ) 500 MG tablet Take 1 tablet (500 mg total) by mouth daily. 11/13/23   Copland, Harlene BROCKS, MD  methocarbamol  (ROBAXIN ) 500 MG tablet Take 1 tablet (500 mg total) by mouth every 8 (eight) hours as needed for muscle spasms. 03/22/24   Brown, Blaine K, PA-C  metoprolol  succinate (TOPROL -XL) 50 MG 24 hr tablet TAKE 1 TABLET(50 MG) BY MOUTH DAILY WITH OR IMMEDIATELY FOLLOWING A MEAL 12/14/23   Copland, Harlene BROCKS, MD  montelukast  (SINGULAIR ) 10 MG tablet Take 1 tablet (10 mg total) by mouth at bedtime. 09/17/23   Copland, Jessica C, MD  mupirocin  ointment (BACTROBAN ) 2 % Place 1 Application into the nose 2 (two) times daily for 60 doses. Use  as  directed 2 times daily for 5 days every other week for 6 weeks. 03/22/24 04/21/24  Brown, Blaine K, PA-C  ondansetron  (ZOFRAN -ODT) 8 MG disintegrating tablet Take 1 tablet (8 mg total) by mouth every 8 (eight) hours as needed for nausea or vomiting. 03/22/24   Delores Berth K, PA-C  Oxycodone  HCl 10 MG TABS Take 1-1.5 tablets (10-15 mg total) by mouth every 8 (eight) hours as needed. 03/09/24   Copland, Harlene BROCKS, MD  Polyethylene Glycol 3350  (MIRALAX PO) Take 17 g by mouth daily as needed (constipation).    [provider]  Rimegepant Sulfate (NURTEC) 75 MG TBDP Take 75 mg by mouth daily as needed (migraine).    [provider]  saccharomyces boulardii (FLORASTOR) 250 MG capsule Take 250 mg by mouth 2 (two) times daily.    [provider]  Semaglutide , 1 MG/DOSE, 4 MG/3ML SOPN Inject 1 mg into the skin once a week. 11/02/23   Copland, Harlene BROCKS, MD  traZODone  (DESYREL ) 100 MG tablet Take 2.5 tablets (250 mg total) by mouth at bedtime. 04/05/24   Copland, Harlene BROCKS, MD  zolpidem  (AMBIEN ) 5 MG tablet TAKE 1 TABLET(5 MG) BY MOUTH AT BEDTIME AS NEEDED FOR SLEEP 11/09/23   Copland, Harlene BROCKS, MD    Family History Family History  Problem Relation Age of Onset   Alzheimer's disease Mother    Migraines Mother    Heart disease Father    Cirrhosis Father    Heart attack Father    Macular degeneration Father    Migraines Sister    Migraines Maternal Grandmother    Migraines Niece    Colon cancer Neg Hx    Esophageal cancer Neg Hx    Rectal cancer Neg Hx    Stomach cancer Neg Hx    Colon polyps Neg Hx     Social History Social History   Tobacco Use   Smoking status: Never   Smokeless tobacco: Never  Vaping Use   Vaping status: Never Used  Substance Use Topics   Alcohol use: Yes    Comment: RARE   Drug use: No     Allergies   Sulfa antibiotics and Statins   Review of Systems Review of Systems  Skin:        Swelling redness and tenderness to top of left foot      Physical Exam Triage Vital Signs ED Triage Vitals  Encounter Vitals Group     BP 04/20/24 1608 118/75     Girls Systolic BP Percentile --      Girls Diastolic BP Percentile --      Boys Systolic BP Percentile --      Boys Diastolic BP Percentile --      Pulse Rate 04/20/24 1608 73     Resp 04/20/24 1608 16     Temp 04/20/24 1608 98 F (36.7 C)     Temp Source 04/20/24 1608 Oral     SpO2 04/20/24 1608 94 %     Weight --      Height --      Head Circumference --      Peak Flow --      Pain Score 04/20/24 1611 3     Pain Loc --      Pain Education --      Exclude from Growth Chart --    No data found.  Updated Vital Signs BP 118/75 (BP Location: Right Arm)   Pulse 73   Temp 98  F (36.7 C) (Oral)   Resp 16   SpO2 94%   Visual Acuity Right Eye Distance:   Left Eye Distance:   Bilateral Distance:    Right Eye Near:   Left Eye Near:    Bilateral Near:     Physical Exam Vitals and nursing note reviewed.  Constitutional:      General: She is not in acute distress.    Appearance: Normal appearance. She is not ill-appearing.  HENT:     Head: Normocephalic and atraumatic.  Eyes:     Pupils: Pupils are equal, round, and reactive to light.  Cardiovascular:     Rate and Rhythm: Normal rate.  Pulmonary:     Effort: Pulmonary effort is normal.  Musculoskeletal:       Feet:     Comments: There is some dry skin noted.  Feet:     Comments: There is mild swelling, erythema and warmth to the mid dorsal aspect of the left foot.  Skin is otherwise warm and dry.  DP +2.  See photo Skin:    General: Skin is warm and dry.  Neurological:     General: No focal deficit present.     Mental Status: She is alert and oriented to person, place, and time.  Psychiatric:        Mood and Affect: Mood normal.        Behavior: Behavior normal.      UC Treatments / Results  Labs (all labs ordered are listed, but only abnormal results are displayed) Labs Reviewed - No data  to display  EKG   Radiology No results found.  Procedures Procedures (including critical care time)  Medications Ordered in UC Medications - No data to display  Initial Impression / Assessment and Plan / UC Course  I have reviewed the triage vital signs and the nursing notes.  Pertinent labs & imaging results that were available during my care of the patient were reviewed by me and considered in my medical decision making (see chart for details).     Reviewed exam and symptoms with patient.  Discussed limitations and abilities of urgent care.  Advised patient that differentials do include cellulitis as well as DVT.  Given her recent knee surgery this must be considered.  Advised I cannot rule this out in the setting and did discuss emergency room evaluation.  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  She states she wants to treat for infection and if there is no improvement she will go to the emergency room.  Advised to start Keflex  4 times daily for 7 days to monitor symptoms extremely closely and to go to the ER ASAP for any worsening symptoms, red flags reviewed and she verbalized understanding.  Also advised follow-up with her PCP tomorrow.  Patient verbalized under standing of these instructions. Final Clinical Impressions(s) / UC Diagnoses   Final diagnoses:  Localized swelling of left foot  Erythema of foot     Discharge Instructions      You can start Keflex  4 times a day for 7 days.  I am unable to rule out a blood clot to the foot and given your recent knee surgery this is a potential.  If your symptoms do not or worsen within the next 24 hours please go to the emergency room ASAP for further evaluation.  I hope you feel better soon!     ED Prescriptions     Medication Sig Dispense Auth.  Provider   cephALEXin  (KEFLEX ) 500 MG capsule Take 1 capsule (500 mg total) by mouth 4 (four) times daily for 7 days. 28 capsule Amalee Olsen, Jodi R, NP      PDMP not  reviewed this encounter.   Loreda Myla SAUNDERS, NP 04/20/24 [phone]

## 2024-04-20 NOTE — Telephone Encounter (Signed)
 Requesting call back from you.

## 2024-04-20 NOTE — Discharge Instructions (Addendum)
 You can start Keflex  4 times a day for 7 days.  I am unable to rule out a blood clot to the foot and given your recent knee surgery this is a potential.  If your symptoms do not improve or worsen within the next 24 hours please go to the emergency room ASAP for further evaluation.  I also recommend you follow-up with your PCP tomorrow.  I hope you feel better soon!

## 2024-04-20 NOTE — ED Triage Notes (Signed)
 Pt present with lt foot pain and swelling. Pt states she recently had knee surgery and is making normal progress. Pt states yesterday her lt foot started to her, she noticed a red circle at the top of her foot and this morning woke up with swelling. Pt does not recall an  injury to the lt foot.

## 2024-04-21 ENCOUNTER — Ambulatory Visit: Payer: Self-pay

## 2024-04-21 ENCOUNTER — Encounter: Payer: Self-pay | Admitting: Family Medicine

## 2024-04-22 ENCOUNTER — Ambulatory Visit: Admitting: Physical Therapy

## 2024-04-22 DIAGNOSIS — R262 Difficulty in walking, not elsewhere classified: Secondary | ICD-10-CM

## 2024-04-22 DIAGNOSIS — M25562 Pain in left knee: Secondary | ICD-10-CM | POA: Diagnosis not present

## 2024-04-22 DIAGNOSIS — M25662 Stiffness of left knee, not elsewhere classified: Secondary | ICD-10-CM

## 2024-04-22 NOTE — Therapy (Signed)
 OUTPATIENT PHYSICAL THERAPY LOWER EXTREMITY    Patient Name: Debbie Hayes MRN: [id number] DOB:1951-03-14, 73 y.o., female Today's Date: 04/22/2024  END OF SESSION:  PT End of Session - 04/22/24 0924     Visit Number 8    Date for Recertification  06/29/24    Authorization Type [employer] medicare    PT Start Time [id number]    PT Stop Time 1010    PT Time Calculation (min) 45 min           Past Medical History:  Diagnosis Date   Allergy    Asthma    Cancer (HCC)    thyroid  cancer   Cataract    Chronic migraine BOTOX INJECTION EVERY 3 MONTHS   Chronic pain in right shoulder    Constipation    Coronary artery disease    Diabetes mellitus    Disorder of inner ear CAUSES VERTIGO OCCASIONALLY   Fibromyalgia    GERD (gastroesophageal reflux disease)    Hemorrhoid    History of bladder infections    History of thyroid  cancer 2009  S/P TOTAL THYROIDECTOMY AND RADIATION   Hyperlipidemia    Hypertension CARDIOLOGIST- DR BLANCA- WILL REQUEST LATE NOTE   DENIES S & S   Hypothyroidism    Insomnia    Left shoulder pain    Macular degeneration    MRSA infection    nasal treated more than 15 years ago   Normal nuclear stress test 01/25/2009   OA (osteoarthritis) JOINTS   Osteopenia    Osteoporosis    Painful orthopaedic hardware    left foot   Pneumonia    PONV (postoperative nausea and vomiting) SEVERE   Rotator cuff disorder LEFT SHOULDER RTC IMPINGMENT   Sleep apnea    mouth guard, no cpap    Sleep apnea in adult 06/26/2017   Spondylolisthesis of lumbar region    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   Wears glasses    Past Surgical History:  Procedure Laterality Date   BACK SURGERY     Dr Mavis- spondylosis   BILATERAL CARPAL TUNNEL RELEASE  1994   BILATERAL ELBOW SURG.  1999   BILATERAL SALPINGOOPHECTOMY  1993   POST-OP URETER REPAIR 12 DAYS AFTER    CATARACT EXTRACTION, BILATERAL     COLONOSCOPY     EYE SURGERY     FOOT ARTHRODESIS Left 07/10/2016    Procedure: LEFT 2ND TARSAL METATARSAL ARTHRODESIS  GASTROC RECESSION LEFT [place]ID[place] MODIFIED MCBRIDE BUIONECTOMY;  Surgeon: Norleen Armor, MD;  Location: [facility];  Service: Orthopedics;  Laterality: Left;   GASTROC RECESSION EXTREMITY Left 07/10/2016   Procedure: GASTROC RECESSION EXTREMITY;  Surgeon: Norleen Armor, MD;  Location: [facility];  Service: Orthopedics;  Laterality: Left;   HARDWARE REMOVAL Left 05/20/2018   Procedure: Left foot removal of deep implants;  Surgeon: Armor Norleen, MD;  Location: [facility];  Service: Orthopedics;  Laterality: Left;    LEFT THUMB JOINT REPLACEMENT  2005   RIGHT DONE IN 2004   OOPHORECTOMY     POLYPECTOMY     RIGHT KNEE ARTHROSCOPY  X2  BEFORE 2011   RIGHT KNEE ARTHROSCOPY/ PARTIAL LATERAL MENISECTOMY/ TRICOMPARTMENT CHONDROPLASTY/ DECOMPRESSION CYST  10/26/2009   RIGHT KNEE CLOSED MANIPULATION  09/11/2010   RIGHT SHOULDER ARTHROSCOPY  2010  &  2004   TIBIA CYST REMOVED AND ORIF LEG FX  1958   TONSILLECTOMY  1968   TOTAL KNEE ARTHROPLASTY  07/16/2010   RIGHT  TOTAL KNEE ARTHROPLASTY Left 03/22/2024   Procedure: ARTHROPLASTY, KNEE, TOTAL;  Surgeon: Josefina Chew, MD;  Location: [facility];  Service: Orthopedics;  Laterality: Left;   TOTAL THYROIDECTOMY  2009   CANCER  (POST-OP BLEED)  AND RADIATION TX   trigger finger repair      07/2023   trigger fingers Right 2013   3rd and 4th fingers   UPPER GASTROINTESTINAL ENDOSCOPY     VAGINAL HYSTERECTOMY  1990   Patient Active Problem List   Diagnosis Date Noted   Pain of right thumb 02/26/2024   Precordial pain 12/23/2023   Primary osteoarthritis of left knee with subchondral insufficiency fractures 09/23/2023   History of arthroplasty of right knee 07/03/2023   Primary osteoarthritis of right shoulder 07/03/2023   Coronary artery disease involving native coronary artery of native heart without angina pectoris 05/25/2023   Agatston coronary artery calcium   score greater than 400 11/06/2022   Statin myopathy 11/06/2022   Frequent UTI 10/29/2022   Wart of hand 09/30/2022   Acute cystitis without hematuria 05/14/2021   Medial epicondylitis, right 03/25/2021   Trigger thumb, right thumb 11/19/2020   Right wrist pain 10/23/2020   Osteoporosis 04/05/2020   Trigger finger, left middle finger 07/27/2019   Chronic neck pain 11/10/2018   Orthopedic hardware present 02/17/2018   Controlled diabetes mellitus type 2 with complications (HCC) 12/15/2017   Intractable chronic migraine without aura and with status migrainosus 12/15/2017   Arthralgia 12/15/2017   Joint pain 12/15/2017   Migraine, transformed 12/15/2017   Chronic migraine without aura, with intractable migraine, so stated, with status migrainosus 12/15/2017   Pain of left shoulder joint on movement 10/19/2017   OSA (obstructive sleep apnea) 06/26/2017   Snoring 03/25/2016   Palpitations 03/25/2016   Headache 03/25/2016   Sensory disorder of trigeminal nerve 11/26/2015   Spondylolisthesis of lumbar region 08/09/2014   Lumbar adjacent segment disease with spondylolisthesis 08/09/2014   Back pain 02/17/2014   Macular degeneration 02/17/2014   Backache 02/17/2014   Other and unspecified hyperlipidemia 10/14/2013   Hyperlipidemia 10/14/2013   Onychomycosis 12/01/2012   Arthritis 04/12/2012   Hypothyroidism 04/12/2012    PCP: Watt, MD  REFERRING PROVIDER: Josefina, MD  REFERRING DIAG: s/p Left TKA  THERAPY DIAG:  Acute pain of left knee  Stiffness of left knee, not elsewhere classified  Difficulty in walking, not elsewhere classified  Rationale for Evaluation and Treatment: Rehabilitation  ONSET DATE: 03/22/24  SUBJECTIVE:   SUBJECTIVE STATEMENT: I think I have turned the corner with pain- much better. Did something to top of left foot - started antibiotics and better but would like to have light session with less weight bearing.   PAIN:  Are you having pain? Left  knee 4-5/10 PRECAUTIONS: None  RED FLAGS: None   WEIGHT BEARING RESTRICTIONS: No  FALLS:  Has patient fallen in last 6 months? No  LIVING ENVIRONMENT: Lives with: lives with their family Lives in: House/apartment Stairs: Yes: Internal: 2 steps; can reach both Has following equipment at home: Single point cane, Walker - 2 wheeled, Crutches, and shower chair  OCCUPATION: retired  PLOF: Independent and shopping and housework  PATIENT GOALS: have normal walk without device, less pain  NEXT MD VISIT: 04/06/24  OBJECTIVE:  Note: Objective measures were completed at Evaluation unless otherwise noted.   COGNITION: Overall cognitive status: Within functional limits for tasks assessed     SENSATION: WFL  EDEMA:  Circumferential: mid patella 47 cm on the left, 40 cm on the  right  MUSCLE LENGTH: Tight calves and HS  POSTURE: rounded shoulders and forward head  PALPATION: Tender, ecchymosis left thigh and knee, inner and posterior  LOWER EXTREMITY ROM:  Active ROM AROM LEft eval PROM Left eval LLE 04/01/24 AROM  LLE 04/04/24  Hip flexion      Hip extension      Hip abduction      Hip adduction      Hip internal rotation      Hip external rotation      Knee flexion 65 70 82 85  Knee extension 15 9 5 5   Ankle dorsiflexion      Ankle plantarflexion      Ankle inversion      Ankle eversion       (Blank rows = not tested)  LOWER EXTREMITY MMT:  MMT Right eval Left eval  Hip flexion    Hip extension    Hip abduction    Hip adduction    Hip internal rotation    Hip external rotation    Knee flexion  2  Knee extension  2  Ankle dorsiflexion    Ankle plantarflexion    Ankle inversion    Ankle eversion     (Blank rows = not tested)  LOWER EXTREMITY SPECIAL TESTS:  Knee special tests: quad lag, ballotable patella  FUNCTIONAL TESTS:  Timed up and go (TUG): 17 seconds  GAIT: Distance walked: 60 feet Assistive device utilized: Walker - 2 wheeled Level  of assistance: Modified independence Comments: mild antalgic gait                                                                                                                                TREATMENT DATE:   04/22/24 Nustep L 5 LE only HS curl 20# 2 sets 10. 10# 10 x LLE Knee ext 5# 2 sets 10 PROM flex and ext STW to knee ,scr and quad 3# LAQ with focus on TKE 2 sets 10 Green tband HS curl 2 sets 10 Act flexion end of session 110  04/19/24 Nustep L 5 LE only Green tband HS curl 2 sets 10 3# LAQ 2 sets 10 3# standing HS curl 2 sets 10 3# 8 in step tap 2 sets 10 6 inch step up fwd and laterally 10 x each Green tband TKE 2 sets 10 PROM flex and ext AROM after 0-103 Leg Press 30# 2 sets 10 Black bar DF and PF 15 x STS wt ball 10 x Resisted gait fwd,back and side ways 20#   04/14/24 Nustep L 5 LE only LAQ 2# 2 sets 10 HS curls red tband 2 sets 10 ( decreased to red to increase ROM) SAQ 10 x TKE with manual resistance 10 x 6 inch step up fwd and laterally 10 x each PROM flex and ext AROM sitting EOB  2-100 Resisted squats 2 sets 10 green tband Leg press 20 #  2 sets 10    04/11/24 NuStep L5x75mins  LAQ 2# 2x10 HS curls green 2x10 Gentle PROM flexion/ext, patellar mobs AROM 95d STS 2x10 Step ups 6  HEP review  04/07/24 Nustep L 4 LAQ 2 sets 10 HS curl 2 sets 10 Leg press 20# 2 sets 10 Step stretch, step tap and calf stretch on step -HEP review and answered questions. STM to the quad and ITB area Gentle PROM into flexion and ext- tolerated fair if gentle    04/04/24 Bike partial reps x 5 minutes Gait without device x 200 feet LAQ 2x10 SAQ 2x10 STM and scar mobilization to the left knee  Feet on ball K2C, rotation, small bridge and isometric abs Ball b/n knees squeeze STM to the quad and ITB area Gentle PROM into flexion she does not tolerate this well Gait without device 200 feet SBA  04/01/24 Nustep L 4 PROM flex and ext Pat  mobs Gentle STW to LEft thigh LAQ 2 sets 10 Yellow tband HS curl 2 sets 10 Step stretch Step up Calf stretch on step VASO low flat 6 min 03/30/24 Evaluation  Nustep level 3 x 5 minutes Vaso low pressure 34 degrees with elevation   PATIENT EDUCATION:  Education details: POC/HEP Person educated: Patient Education method: Explanation, Facilities manager, and Handouts Education comprehension: verbalized understanding  HOME EXERCISE PROGRAM: Access Code: [email or web address] URL: [email or web address] Date: 04/11/2024 Prepared by: Almetta Fam  Exercises - Standing Knee Flexion Stretch on Step  - 3- x daily - 7 x weekly - 1 sets - 5-10 reps - 5-10 hold - Heel Raise on Step  - 3 x daily - 7 x weekly - 1 sets - 10 reps - Standing Gastroc Stretch on Step  - 3 x daily - 7 x weekly - 1 sets - 10 reps - Forward Step Touch  - 3 x daily - 7 x weekly - 1 sets - 10 reps - Seated Knee Flexion Slide  - 3 x daily - 7 x weekly - 1 sets - 10 reps - Sit to Stand  - 3 x daily - 7 x weekly - 1 sets - 10 reps  ASSESSMENT:  CLINICAL IMPRESSION: I think I have turned the corner with pain- much better. Did something to top of left foot - started antibiotics and better but would like to have light session with less weight bearing. Improved tolerance to strengthening and 110 AROM at end of session OBJECTIVE IMPAIRMENTS: Abnormal gait, cardiopulmonary status limiting activity, decreased activity tolerance, decreased balance, decreased endurance, decreased mobility, difficulty walking, decreased ROM, decreased strength, increased edema, increased muscle spasms, impaired flexibility, impaired vision/preception, improper body mechanics, postural dysfunction, and pain.   REHAB POTENTIAL: Good  CLINICAL DECISION MAKING: Evolving/moderate complexity  EVALUATION COMPLEXITY: Moderate   GOALS: Goals reviewed with patient? Yes  SHORT TERM GOALS: Target date: 04/20/24 Independent with initial HEP Baseline: Goal  status: 04/01/24 MET  LONG TERM GOALS: Target date: 06/29/24  Independent with advanced HEP Baseline:  Goal status: progressing 04/14/24  2.  Independent with RICE Baseline:  Goal status: MET 04/14/24  3.  Decrease pain 50% for better function and quality of life Baseline:  Goal status: progressing 04/14/24  4.  Decrease TUG to 12 seconds without device Baseline:  Goal status: 04/22/24 MET  5.  Increase AROM of the left knee to 4-116 degrees flexion Baseline:  Goal status: 04/14/24 2-100 progressing  and 04/22/24  6.  Go up and down steps step over step Baseline:  Goal status: 04/14/24 progressing   PLAN:  PT FREQUENCY: 2x/week  PT DURATION: 12 weeks  PLANNED INTERVENTIONS: 97164- PT Re-evaluation, 97110-Therapeutic exercises, 97530- Therapeutic activity, 97112- Neuromuscular re-education, 97535- Self Care, [id number]- Manual therapy, [phone]- Gait training, [phone]- Electrical stimulation (unattended), 97016- Vasopneumatic device, Patient/Family education, Balance training, Stair training, Taping, Joint mobilization, and Cryotherapy  PLAN FOR NEXT SESSION: progress ROM and strength   Jenesa Foresta,ANGIE, PTA 04/22/2024, 9:25 AM

## 2024-04-25 ENCOUNTER — Telehealth: Payer: Self-pay | Admitting: Family Medicine

## 2024-04-25 NOTE — Telephone Encounter (Unsigned)
 Copied from CRM [id number]. Topic: Clinical - Medication Question >> Apr 25, 2024  3:01 PM Robinson H wrote: Reason for CRM: [employer] [place] calling to obtain If patient last statin history any contra-indication to statin, any allergy, kidney or liver disease. Patient on Rapatha, if patient has tried another medication before the Repatha   [employer] [place] [phone]  Ext [phone] Mon-Fri 8:30-5:00 pm EST

## 2024-04-26 ENCOUNTER — Other Ambulatory Visit (HOSPITAL_COMMUNITY): Payer: Self-pay

## 2024-04-26 ENCOUNTER — Telehealth: Payer: Self-pay

## 2024-04-26 ENCOUNTER — Ambulatory Visit: Admitting: Physical Therapy

## 2024-04-26 DIAGNOSIS — M25562 Pain in left knee: Secondary | ICD-10-CM

## 2024-04-26 DIAGNOSIS — R262 Difficulty in walking, not elsewhere classified: Secondary | ICD-10-CM

## 2024-04-26 DIAGNOSIS — M25662 Stiffness of left knee, not elsewhere classified: Secondary | ICD-10-CM

## 2024-04-26 DIAGNOSIS — E782 Mixed hyperlipidemia: Secondary | ICD-10-CM

## 2024-04-26 MED ORDER — RYALTRIS 665-25 MCG/ACT NA SUSP: NASAL | 99 refills | Status: AC

## 2024-04-26 MED ORDER — REPATHA SURECLICK 140 MG/ML ~~LOC~~ SOAJ: 140.0000 mg | SUBCUTANEOUS | 3 refills | Status: AC

## 2024-04-26 NOTE — Therapy (Signed)
 OUTPATIENT PHYSICAL THERAPY LOWER EXTREMITY    Patient Name: Debbie Hayes MRN: [id number] DOB:07/24/1951, 73 y.o., female Today's Date: 04/26/2024  END OF SESSION:  PT End of Session - 04/26/24 1316     Visit Number 9    Date for Recertification  06/29/24    Authorization Type [employer] medicare    PT Start Time 1315    PT Stop Time 1345    PT Time Calculation (min) 30 min           Past Medical History:  Diagnosis Date   Allergy    Asthma    Cancer (HCC)    thyroid  cancer   Cataract    Chronic migraine BOTOX INJECTION EVERY 3 MONTHS   Chronic pain in right shoulder    Constipation    Coronary artery disease    Diabetes mellitus    Disorder of inner ear CAUSES VERTIGO OCCASIONALLY   Fibromyalgia    GERD (gastroesophageal reflux disease)    Hemorrhoid    History of bladder infections    History of thyroid  cancer 2009  S/P TOTAL THYROIDECTOMY AND RADIATION   Hyperlipidemia    Hypertension CARDIOLOGIST- DR BLANCA- WILL REQUEST LATE NOTE   DENIES S & S   Hypothyroidism    Insomnia    Left shoulder pain    Macular degeneration    MRSA infection    nasal treated more than 15 years ago   Normal nuclear stress test 01/25/2009   OA (osteoarthritis) JOINTS   Osteopenia    Osteoporosis    Painful orthopaedic hardware    left foot   Pneumonia    PONV (postoperative nausea and vomiting) SEVERE   Rotator cuff disorder LEFT SHOULDER RTC IMPINGMENT   Sleep apnea    mouth guard, no cpap    Sleep apnea in adult 06/26/2017   Spondylolisthesis of lumbar region    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   Wears glasses    Past Surgical History:  Procedure Laterality Date   BACK SURGERY     Dr Mavis- spondylosis   BILATERAL CARPAL TUNNEL RELEASE  1994   BILATERAL ELBOW SURG.  1999   BILATERAL SALPINGOOPHECTOMY  1993   POST-OP URETER REPAIR 12 DAYS AFTER    CATARACT EXTRACTION, BILATERAL     COLONOSCOPY     EYE SURGERY     FOOT ARTHRODESIS Left 07/10/2016    Procedure: LEFT 2ND TARSAL METATARSAL ARTHRODESIS  GASTROC RECESSION LEFT LAPIDUS MODIFIED MCBRIDE BUIONECTOMY;  Surgeon: Norleen Armor, MD;  Location: [facility];  Service: Orthopedics;  Laterality: Left;   GASTROC RECESSION EXTREMITY Left 07/10/2016   Procedure: GASTROC RECESSION EXTREMITY;  Surgeon: Norleen Armor, MD;  Location: [facility];  Service: Orthopedics;  Laterality: Left;   HARDWARE REMOVAL Left 05/20/2018   Procedure: Left foot removal of deep implants;  Surgeon: Armor Norleen, MD;  Location: [facility];  Service: Orthopedics;  Laterality: Left;    LEFT THUMB JOINT REPLACEMENT  2005   RIGHT DONE IN 2004   OOPHORECTOMY     POLYPECTOMY     RIGHT KNEE ARTHROSCOPY  X2  BEFORE 2011   RIGHT KNEE ARTHROSCOPY/ PARTIAL LATERAL MENISECTOMY/ TRICOMPARTMENT CHONDROPLASTY/ DECOMPRESSION CYST  10/26/2009   RIGHT KNEE CLOSED MANIPULATION  09/11/2010   RIGHT SHOULDER ARTHROSCOPY  2010  &  2004   TIBIA CYST REMOVED AND ORIF LEG FX  1958   TONSILLECTOMY  1968   TOTAL KNEE ARTHROPLASTY  07/16/2010   RIGHT  TOTAL KNEE ARTHROPLASTY Left 03/22/2024   Procedure: ARTHROPLASTY, KNEE, TOTAL;  Surgeon: Josefina Chew, MD;  Location: [facility];  Service: Orthopedics;  Laterality: Left;   TOTAL THYROIDECTOMY  2009   CANCER  (POST-OP BLEED)  AND RADIATION TX   trigger finger repair      07/2023   trigger fingers Right 2013   3rd and 4th fingers   UPPER GASTROINTESTINAL ENDOSCOPY     VAGINAL HYSTERECTOMY  1990   Patient Active Problem List   Diagnosis Date Noted   Pain of right thumb 02/26/2024   Precordial pain 12/23/2023   Primary osteoarthritis of left knee with subchondral insufficiency fractures 09/23/2023   History of arthroplasty of right knee 07/03/2023   Primary osteoarthritis of right shoulder 07/03/2023   Coronary artery disease involving native coronary artery of native heart without angina pectoris 05/25/2023   Agatston coronary artery calcium   score greater than 400 11/06/2022   Statin myopathy 11/06/2022   Frequent UTI 10/29/2022   Wart of hand 09/30/2022   Acute cystitis without hematuria 05/14/2021   Medial epicondylitis, right 03/25/2021   Trigger thumb, right thumb 11/19/2020   Right wrist pain 10/23/2020   Osteoporosis 04/05/2020   Trigger finger, left middle finger 07/27/2019   Chronic neck pain 11/10/2018   Orthopedic hardware present 02/17/2018   Controlled diabetes mellitus type 2 with complications (HCC) 12/15/2017   Intractable chronic migraine without aura and with status migrainosus 12/15/2017   Arthralgia 12/15/2017   Joint pain 12/15/2017   Migraine, transformed 12/15/2017   Chronic migraine without aura, with intractable migraine, so stated, with status migrainosus 12/15/2017   Pain of left shoulder joint on movement 10/19/2017   OSA (obstructive sleep apnea) 06/26/2017   Snoring 03/25/2016   Palpitations 03/25/2016   Headache 03/25/2016   Sensory disorder of trigeminal nerve 11/26/2015   Spondylolisthesis of lumbar region 08/09/2014   Lumbar adjacent segment disease with spondylolisthesis 08/09/2014   Back pain 02/17/2014   Macular degeneration 02/17/2014   Backache 02/17/2014   Other and unspecified hyperlipidemia 10/14/2013   Hyperlipidemia 10/14/2013   Onychomycosis 12/01/2012   Arthritis 04/12/2012   Hypothyroidism 04/12/2012    PCP: Watt, MD  REFERRING PROVIDER: Josefina, MD  REFERRING DIAG: s/p Left TKA  THERAPY DIAG:  Acute pain of left knee  Stiffness of left knee, not elsewhere classified  Difficulty in walking, not elsewhere classified  Rationale for Evaluation and Treatment: Rehabilitation  ONSET DATE: 03/22/24  SUBJECTIVE:   SUBJECTIVE STATEMENT: Knee is better but foot is awful -saw MD and they can not find anything wrong giving me prednisone  PAIN:  Are you having pain? Left knee 4-5/10 PRECAUTIONS: None  RED FLAGS: None   WEIGHT BEARING RESTRICTIONS:  No  FALLS:  Has patient fallen in last 6 months? No  LIVING ENVIRONMENT: Lives with: lives with their family Lives in: House/apartment Stairs: Yes: Internal: 2 steps; can reach both Has following equipment at home: Single point cane, Walker - 2 wheeled, Crutches, and shower chair  OCCUPATION: retired  PLOF: Independent and shopping and housework  PATIENT GOALS: have normal walk without device, less pain  NEXT MD VISIT: 04/06/24  OBJECTIVE:  Note: Objective measures were completed at Evaluation unless otherwise noted.   COGNITION: Overall cognitive status: Within functional limits for tasks assessed     SENSATION: WFL  EDEMA:  Circumferential: mid patella 47 cm on the left, 40 cm on the right  MUSCLE LENGTH: Tight calves and HS  POSTURE: rounded shoulders and forward head  PALPATION:  Tender, ecchymosis left thigh and knee, inner and posterior  LOWER EXTREMITY ROM:  Active ROM AROM LEft eval PROM Left eval LLE 04/01/24 AROM  LLE 04/04/24  Hip flexion      Hip extension      Hip abduction      Hip adduction      Hip internal rotation      Hip external rotation      Knee flexion 65 70 82 85  Knee extension 15 9 5 5   Ankle dorsiflexion      Ankle plantarflexion      Ankle inversion      Ankle eversion       (Blank rows = not tested)  LOWER EXTREMITY MMT:  MMT Right eval Left eval  Hip flexion    Hip extension    Hip abduction    Hip adduction    Hip internal rotation    Hip external rotation    Knee flexion  2  Knee extension  2  Ankle dorsiflexion    Ankle plantarflexion    Ankle inversion    Ankle eversion     (Blank rows = not tested)  LOWER EXTREMITY SPECIAL TESTS:  Knee special tests: quad lag, ballotable patella  FUNCTIONAL TESTS:  Timed up and go (TUG): 17 seconds  GAIT: Distance walked: 60 feet Assistive device utilized: Environmental consultant - 2 wheeled Level of assistance: Modified independence Comments: mild antalgic gait                                                                                                                                 TREATMENT DATE:   04/26/24 PROM, stretching and STW Left knee and ankle/foot Nustep L 5 Func stepping multi directional for ROM Looked at gait but pt struggling d/t pain in foot and great toe AROM 0-112   04/22/24 Nustep L 5 LE only HS curl 20# 2 sets 10. 10# 10 x LLE Knee ext 5# 2 sets 10 PROM flex and ext STW to knee ,scr and quad 3# LAQ with focus on TKE 2 sets 10 Green tband HS curl 2 sets 10 Act flexion end of session 110  04/19/24 Nustep L 5 LE only Green tband HS curl 2 sets 10 3# LAQ 2 sets 10 3# standing HS curl 2 sets 10 3# 8 in step tap 2 sets 10 6 inch step up fwd and laterally 10 x each Green tband TKE 2 sets 10 PROM flex and ext AROM after 0-103 Leg Press 30# 2 sets 10 Black bar DF and PF 15 x STS wt ball 10 x Resisted gait fwd,back and side ways 20#   04/14/24 Nustep L 5 LE only LAQ 2# 2 sets 10 HS curls red tband 2 sets 10 ( decreased to red to increase ROM) SAQ 10 x TKE with manual resistance 10 x 6 inch step up fwd and laterally 10 x each PROM  flex and ext AROM sitting EOB  2-100 Resisted squats 2 sets 10 green tband Leg press 20 # 2 sets 10    04/11/24 NuStep L5x59mins  LAQ 2# 2x10 HS curls green 2x10 Gentle PROM flexion/ext, patellar mobs AROM 95d STS 2x10 Step ups 6  HEP review  04/07/24 Nustep L 4 LAQ 2 sets 10 HS curl 2 sets 10 Leg press 20# 2 sets 10 Step stretch, step tap and calf stretch on step -HEP review and answered questions. STM to the quad and ITB area Gentle PROM into flexion and ext- tolerated fair if gentle    04/04/24 Bike partial reps x 5 minutes Gait without device x 200 feet LAQ 2x10 SAQ 2x10 STM and scar mobilization to the left knee  Feet on ball K2C, rotation, small bridge and isometric abs Ball b/n knees squeeze STM to the quad and ITB area Gentle PROM into flexion  she does not tolerate this well Gait without device 200 feet SBA  04/01/24 Nustep L 4 PROM flex and ext Pat mobs Gentle STW to LEft thigh LAQ 2 sets 10 Yellow tband HS curl 2 sets 10 Step stretch Step up Calf stretch on step VASO low flat 6 min 03/30/24 Evaluation  Nustep level 3 x 5 minutes Vaso low pressure 34 degrees with elevation   PATIENT EDUCATION:  Education details: POC/HEP Person educated: Patient Education method: Explanation, Facilities manager, and Handouts Education comprehension: verbalized understanding  HOME EXERCISE PROGRAM: Access Code: [email or web address] URL: [email or web address] Date: 04/11/2024 Prepared by: Almetta Fam  Exercises - Standing Knee Flexion Stretch on Step  - 3- x daily - 7 x weekly - 1 sets - 5-10 reps - 5-10 hold - Heel Raise on Step  - 3 x daily - 7 x weekly - 1 sets - 10 reps - Standing Gastroc Stretch on Step  - 3 x daily - 7 x weekly - 1 sets - 10 reps - Forward Step Touch  - 3 x daily - 7 x weekly - 1 sets - 10 reps - Seated Knee Flexion Slide  - 3 x daily - 7 x weekly - 1 sets - 10 reps - Sit to Stand  - 3 x daily - 7 x weekly - 1 sets - 10 reps  ASSESSMENT:  CLINICAL IMPRESSION: pt arrived with increased pain in foot, stated saw MD all test negative but rec starting prednisone . Limited tolerance to session d/t pain. Decreased gait with holding ankle and knee stiff to avoid foot and great toe ext.SABRA good AROM 0-112  OBJECTIVE IMPAIRMENTS: Abnormal gait, cardiopulmonary status limiting activity, decreased activity tolerance, decreased balance, decreased endurance, decreased mobility, difficulty walking, decreased ROM, decreased strength, increased edema, increased muscle spasms, impaired flexibility, impaired vision/preception, improper body mechanics, postural dysfunction, and pain.   REHAB POTENTIAL: Good  CLINICAL DECISION MAKING: Evolving/moderate complexity  EVALUATION COMPLEXITY: Moderate   GOALS: Goals reviewed  with patient? Yes  SHORT TERM GOALS: Target date: 04/20/24 Independent with initial HEP Baseline: Goal status: 04/01/24 MET  LONG TERM GOALS: Target date: 06/29/24  Independent with advanced HEP Baseline:  Goal status: progressing 04/14/24  2.  Independent with RICE Baseline:  Goal status: MET 04/14/24  3.  Decrease pain 50% for better function and quality of life Baseline:  Goal status: progressing 04/14/24  4.  Decrease TUG to 12 seconds without device Baseline:  Goal status: 04/22/24 MET  5.  Increase AROM of the left knee to 4-116 degrees flexion Baseline:  Goal status: 04/14/24 2-100 progressing  and 04/22/24  6.  Go up and down steps step over step Baseline:  Goal status: 04/14/24 progressing   PLAN:  PT FREQUENCY: 2x/week  PT DURATION: 12 weeks  PLANNED INTERVENTIONS: 97164- PT Re-evaluation, 97110-Therapeutic exercises, 97530- Therapeutic activity, 97112- Neuromuscular re-education, 97535- Self Care, 02859- Manual therapy, [phone]- Gait training, [phone]- Electrical stimulation (unattended), 97016- Vasopneumatic device, Patient/Family education, Balance training, Stair training, Taping, Joint mobilization, and Cryotherapy  PLAN FOR NEXT SESSION: progress ROM and strength   Roderick Sweezy,ANGIE, PTA 04/26/2024, 1:53 PM

## 2024-04-26 NOTE — Addendum Note (Signed)
 Addended by: WATT RAISIN C on: 04/26/2024 02:17 PM   Modules accepted: Orders

## 2024-04-26 NOTE — Telephone Encounter (Signed)
 I returned a call to [facility] at [phone] ext [phone]  I spoke with her and she asked if the patient had an intolerance to statins prior to taking Repatha . She wants the provider to send in a new script to the pharmacy for the patient because the last fill was in May of 2025. She also wants the provider to take with the patient about medication adherence of the Repatha  at her next office visit with Dr. Watt.

## 2024-04-28 ENCOUNTER — Encounter: Payer: Self-pay | Admitting: Family Medicine

## 2024-04-28 DIAGNOSIS — R2242 Localized swelling, mass and lump, left lower limb: Secondary | ICD-10-CM

## 2024-04-28 NOTE — Therapy (Signed)
 OUTPATIENT PHYSICAL THERAPY LOWER EXTREMITY  Progress Note Reporting Period 03/30/24 to 04/29/24  See note below for Objective Data and Assessment of Progress/Goals.      Patient Name: Debbie Hayes MRN: [id number] DOB:1951-05-31, 73 y.o., female Today's Date: 04/29/2024  END OF SESSION:  PT End of Session - 04/29/24 1014     Visit Number 10    Date for Recertification  06/29/24    Authorization Type [employer] medicare    PT Start Time 1015    PT Stop Time 1100    PT Time Calculation (min) 45 min            Past Medical History:  Diagnosis Date   Allergy    Asthma    Cancer (HCC)    thyroid  cancer   Cataract    Chronic migraine BOTOX INJECTION EVERY 3 MONTHS   Chronic pain in right shoulder    Constipation    Coronary artery disease    Diabetes mellitus    Disorder of inner ear CAUSES VERTIGO OCCASIONALLY   Fibromyalgia    GERD (gastroesophageal reflux disease)    Hemorrhoid    History of bladder infections    History of thyroid  cancer 2009  S/P TOTAL THYROIDECTOMY AND RADIATION   Hyperlipidemia    Hypertension CARDIOLOGIST- DR BLANCA- WILL REQUEST LATE NOTE   DENIES S & S   Hypothyroidism    Insomnia    Left shoulder pain    Macular degeneration    MRSA infection    nasal treated more than 15 years ago   Normal nuclear stress test 01/25/2009   OA (osteoarthritis) JOINTS   Osteopenia    Osteoporosis    Painful orthopaedic hardware    left foot   Pneumonia    PONV (postoperative nausea and vomiting) SEVERE   Rotator cuff disorder LEFT SHOULDER RTC IMPINGMENT   Sleep apnea    mouth guard, no cpap    Sleep apnea in adult 06/26/2017   Spondylolisthesis of lumbar region    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   Wears glasses    Past Surgical History:  Procedure Laterality Date   BACK SURGERY     Dr Mavis- spondylosis   BILATERAL CARPAL TUNNEL RELEASE  1994   BILATERAL ELBOW SURG.  1999   BILATERAL SALPINGOOPHECTOMY  1993   POST-OP URETER  REPAIR 12 DAYS AFTER    CATARACT EXTRACTION, BILATERAL     COLONOSCOPY     EYE SURGERY     FOOT ARTHRODESIS Left 07/10/2016   Procedure: LEFT 2ND TARSAL METATARSAL ARTHRODESIS  GASTROC RECESSION LEFT [place] MODIFIED MCBRIDE BUIONECTOMY;  Surgeon: Norleen Armor, MD;  Location: [facility];  Service: Orthopedics;  Laterality: Left;   GASTROC RECESSION EXTREMITY Left 07/10/2016   Procedure: GASTROC RECESSION EXTREMITY;  Surgeon: Norleen Armor, MD;  Location: [facility];  Service: Orthopedics;  Laterality: Left;   HARDWARE REMOVAL Left 05/20/2018   Procedure: Left foot removal of deep implants;  Surgeon: Armor Norleen, MD;  Location: [facility];  Service: Orthopedics;  Laterality: Left;    LEFT THUMB JOINT REPLACEMENT  2005   RIGHT DONE IN 2004   OOPHORECTOMY     POLYPECTOMY     RIGHT KNEE ARTHROSCOPY  X2  BEFORE 2011   RIGHT KNEE ARTHROSCOPY/ PARTIAL LATERAL MENISECTOMY/ TRICOMPARTMENT CHONDROPLASTY/ DECOMPRESSION CYST  10/26/2009   RIGHT KNEE CLOSED MANIPULATION  09/11/2010   RIGHT SHOULDER ARTHROSCOPY  2010  &  2004   TIBIA CYST  REMOVED AND ORIF LEG FX  1958   TONSILLECTOMY  1968   TOTAL KNEE ARTHROPLASTY  07/16/2010   RIGHT   TOTAL KNEE ARTHROPLASTY Left 03/22/2024   Procedure: ARTHROPLASTY, KNEE, TOTAL;  Surgeon: Josefina Chew, MD;  Location: [facility];  Service: Orthopedics;  Laterality: Left;   TOTAL THYROIDECTOMY  2009   CANCER  (POST-OP BLEED)  AND RADIATION TX   trigger finger repair      07/2023   trigger fingers Right 2013   3rd and 4th fingers   UPPER GASTROINTESTINAL ENDOSCOPY     VAGINAL HYSTERECTOMY  1990   Patient Active Problem List   Diagnosis Date Noted   Pain of right thumb 02/26/2024   Precordial pain 12/23/2023   Primary osteoarthritis of left knee with subchondral insufficiency fractures 09/23/2023   History of arthroplasty of right knee 07/03/2023   Primary osteoarthritis of right shoulder 07/03/2023   Coronary  artery disease involving native coronary artery of native heart without angina pectoris 05/25/2023   Agatston coronary artery calcium  score greater than 400 11/06/2022   Statin myopathy 11/06/2022   Frequent UTI 10/29/2022   Wart of hand 09/30/2022   Acute cystitis without hematuria 05/14/2021   Medial epicondylitis, right 03/25/2021   Trigger thumb, right thumb 11/19/2020   Right wrist pain 10/23/2020   Osteoporosis 04/05/2020   Trigger finger, left middle finger 07/27/2019   Chronic neck pain 11/10/2018   Orthopedic hardware present 02/17/2018   Controlled diabetes mellitus type 2 with complications (HCC) 12/15/2017   Intractable chronic migraine without aura and with status migrainosus 12/15/2017   Arthralgia 12/15/2017   Joint pain 12/15/2017   Migraine, transformed 12/15/2017   Chronic migraine without aura, with intractable migraine, so stated, with status migrainosus 12/15/2017   Pain of left shoulder joint on movement 10/19/2017   OSA (obstructive sleep apnea) 06/26/2017   Snoring 03/25/2016   Palpitations 03/25/2016   Headache 03/25/2016   Sensory disorder of trigeminal nerve 11/26/2015   Spondylolisthesis of lumbar region 08/09/2014   Lumbar adjacent segment disease with spondylolisthesis 08/09/2014   Back pain 02/17/2014   Macular degeneration 02/17/2014   Backache 02/17/2014   Other and unspecified hyperlipidemia 10/14/2013   Hyperlipidemia 10/14/2013   Onychomycosis 12/01/2012   Arthritis 04/12/2012   Hypothyroidism 04/12/2012    PCP: Copland, MD  REFERRING PROVIDER: Josefina, MD  REFERRING DIAG: s/p Left TKA  THERAPY DIAG:  Acute pain of left knee  Stiffness of left knee, not elsewhere classified  Difficulty in walking, not elsewhere classified  Localized edema  Rationale for Evaluation and Treatment: Rehabilitation  ONSET DATE: 03/22/24  SUBJECTIVE:   SUBJECTIVE STATEMENT: The foot is still hurting, going to see my PCP to see if I have gout. The  knee still hurts.   PAIN:  Are you having pain? Left knee 4-5/10 PRECAUTIONS: None  RED FLAGS: None   WEIGHT BEARING RESTRICTIONS: No  FALLS:  Has patient fallen in last 6 months? No  LIVING ENVIRONMENT: Lives with: lives with their family Lives in: House/apartment Stairs: Yes: Internal: 2 steps; can reach both Has following equipment at home: Single point cane, Walker - 2 wheeled, Crutches, and shower chair  OCCUPATION: retired  PLOF: Independent and shopping and housework  PATIENT GOALS: have normal walk without device, less pain  NEXT MD VISIT: 04/06/24  OBJECTIVE:  Note: Objective measures were completed at Evaluation unless otherwise noted.   COGNITION: Overall cognitive status: Within functional limits for tasks assessed     SENSATION: WFL  EDEMA:  Circumferential: mid patella 47 cm on the left, 40 cm on the right  MUSCLE LENGTH: Tight calves and HS  POSTURE: rounded shoulders and forward head  PALPATION: Tender, ecchymosis left thigh and knee, inner and posterior  LOWER EXTREMITY ROM:  Active ROM AROM LEft eval PROM Left eval LLE 04/01/24 AROM  LLE 04/04/24 04/26/24 04/29/24  Hip flexion        Hip extension        Hip abduction        Hip adduction        Hip internal rotation        Hip external rotation        Knee flexion 65 70 82 85 112 110  Knee extension 15 9 5 5  0 0  Ankle dorsiflexion        Ankle plantarflexion        Ankle inversion        Ankle eversion         (Blank rows = not tested)  LOWER EXTREMITY MMT:  MMT Right eval Left eval  Hip flexion    Hip extension    Hip abduction    Hip adduction    Hip internal rotation    Hip external rotation    Knee flexion  2  Knee extension  2  Ankle dorsiflexion    Ankle plantarflexion    Ankle inversion    Ankle eversion     (Blank rows = not tested)  LOWER EXTREMITY SPECIAL TESTS:  Knee special tests: quad lag, ballotable patella  FUNCTIONAL TESTS:  Timed up and go  (TUG): 17 seconds  GAIT: Distance walked: 60 feet Assistive device utilized: Environmental consultant - 2 wheeled Level of assistance: Modified independence Comments: mild antalgic gait                                                                                                                                TREATMENT DATE:  04/29/24 Recheck goals for 10th visit  NuStep L5 x47mins LE only  Bike seat 11 x70mins for ROM Leg ext 5# 2x10 HS curls 20# 2x10 STS 2x10 Leg press 20# 2x10 Step ups 4   04/26/24 PROM, stretching and STW Left knee and ankle/foot Nustep L 5 Func stepping multi directional for ROM Looked at gait but pt struggling d/t pain in foot and great toe AROM 0-112   04/22/24 Nustep L 5 LE only HS curl 20# 2 sets 10. 10# 10 x LLE Knee ext 5# 2 sets 10 PROM flex and ext STW to knee ,scr and quad 3# LAQ with focus on TKE 2 sets 10 Green tband HS curl 2 sets 10 Act flexion end of session 110  04/19/24 Nustep L 5 LE only Green tband HS curl 2 sets 10 3# LAQ 2 sets 10 3# standing HS curl 2 sets 10 3# 8 in step tap 2 sets 10 6 inch step up  fwd and laterally 10 x each Green tband TKE 2 sets 10 PROM flex and ext AROM after 0-103 Leg Press 30# 2 sets 10 Black bar DF and PF 15 x STS wt ball 10 x Resisted gait fwd,back and side ways 20#   04/14/24 Nustep L 5 LE only LAQ 2# 2 sets 10 HS curls red tband 2 sets 10 ( decreased to red to increase ROM) SAQ 10 x TKE with manual resistance 10 x 6 inch step up fwd and laterally 10 x each PROM flex and ext AROM sitting EOB  2-100 Resisted squats 2 sets 10 green tband Leg press 20 # 2 sets 10    04/11/24 NuStep L5x53mins  LAQ 2# 2x10 HS curls green 2x10 Gentle PROM flexion/ext, patellar mobs AROM 95d STS 2x10 Step ups 6  HEP review  04/07/24 Nustep L 4 LAQ 2 sets 10 HS curl 2 sets 10 Leg press 20# 2 sets 10 Step stretch, step tap and calf stretch on step -HEP review and answered questions. STM  to the quad and ITB area Gentle PROM into flexion and ext- tolerated fair if gentle    04/04/24 Bike partial reps x 5 minutes Gait without device x 200 feet LAQ 2x10 SAQ 2x10 STM and scar mobilization to the left knee  Feet on ball K2C, rotation, small bridge and isometric abs Ball b/n knees squeeze STM to the quad and ITB area Gentle PROM into flexion she does not tolerate this well Gait without device 200 feet SBA  04/01/24 Nustep L 4 PROM flex and ext Pat mobs Gentle STW to LEft thigh LAQ 2 sets 10 Yellow tband HS curl 2 sets 10 Step stretch Step up Calf stretch on step VASO low flat 6 min 03/30/24 Evaluation  Nustep level 3 x 5 minutes Vaso low pressure 34 degrees with elevation   PATIENT EDUCATION:  Education details: POC/HEP Person educated: Patient Education method: Explanation, Facilities manager, and Handouts Education comprehension: verbalized understanding  HOME EXERCISE PROGRAM: Access Code: [email or web address] URL: [email or web address] Date: 04/11/2024 Prepared by: Almetta Fam  Exercises - Standing Knee Flexion Stretch on Step  - 3- x daily - 7 x weekly - 1 sets - 5-10 reps - 5-10 hold - Heel Raise on Step  - 3 x daily - 7 x weekly - 1 sets - 10 reps - Standing Gastroc Stretch on Step  - 3 x daily - 7 x weekly - 1 sets - 10 reps - Forward Step Touch  - 3 x daily - 7 x weekly - 1 sets - 10 reps - Seated Knee Flexion Slide  - 3 x daily - 7 x weekly - 1 sets - 10 reps - Sit to Stand  - 3 x daily - 7 x weekly - 1 sets - 10 reps  ASSESSMENT:  CLINICAL IMPRESSION: pt arrived with increased pain in foot, which is causing her to limp more. She will see her PCP on Monday to rule out possible gout. With 10th visit progress note she has made good progress overall and ROM is good. She was able to do full revs on the bike for the first time. Still some difficulty with steps mostly coming down, she is not fully weight bearing through the L and almost skipping  down. She reports not being able to be as diligent with her HEP especially with standing exercises due to foot pain. Does well in session today despite foot problems.   OBJECTIVE IMPAIRMENTS: Abnormal gait, cardiopulmonary status limiting  activity, decreased activity tolerance, decreased balance, decreased endurance, decreased mobility, difficulty walking, decreased ROM, decreased strength, increased edema, increased muscle spasms, impaired flexibility, impaired vision/preception, improper body mechanics, postural dysfunction, and pain.   REHAB POTENTIAL: Good  CLINICAL DECISION MAKING: Evolving/moderate complexity  EVALUATION COMPLEXITY: Moderate   GOALS: Goals reviewed with patient? Yes  SHORT TERM GOALS: Target date: 04/20/24 Independent with initial HEP Baseline: Goal status: 04/01/24 MET  LONG TERM GOALS: Target date: 06/29/24  Independent with advanced HEP Baseline:  Goal status: progressing 04/14/24  2.  Independent with RICE Baseline:  Goal status: MET 04/14/24  3.  Decrease pain 50% for better function and quality of life Baseline:  Goal status: progressing 04/14/24, not 50% yet progressing 04/29/24  4.  Decrease TUG to 12 seconds without device Baseline:  Goal status: 04/22/24 MET  5.  Increase AROM of the left knee to 4-116 degrees flexion Baseline:  Goal status: 04/14/24 2-100 progressing  and 04/22/24, 0-110d 04/29/24 progressing  6.  Go up and down steps step over step Baseline:  Goal status: 04/14/24 progressing, progressing 04/29/24   PLAN:  PT FREQUENCY: 2x/week  PT DURATION: 12 weeks  PLANNED INTERVENTIONS: 97164- PT Re-evaluation, 97110-Therapeutic exercises, 97530- Therapeutic activity, 97112- Neuromuscular re-education, 97535- Self Care, [id number]- Manual therapy, [phone]- Gait training, [phone]- Electrical stimulation (unattended), 97016- Vasopneumatic device, Patient/Family education, Balance training, Stair training, Taping, Joint mobilization, and  Cryotherapy  PLAN FOR NEXT SESSION: progress ROM and strength   Shanine Kreiger, PT 04/29/2024, 10:54 AM

## 2024-04-29 ENCOUNTER — Ambulatory Visit: Attending: Orthopedic Surgery

## 2024-04-29 DIAGNOSIS — R600 Localized edema: Secondary | ICD-10-CM | POA: Diagnosis present

## 2024-04-29 DIAGNOSIS — R262 Difficulty in walking, not elsewhere classified: Secondary | ICD-10-CM | POA: Diagnosis present

## 2024-04-29 DIAGNOSIS — M25662 Stiffness of left knee, not elsewhere classified: Secondary | ICD-10-CM | POA: Diagnosis present

## 2024-04-29 DIAGNOSIS — M25562 Pain in left knee: Secondary | ICD-10-CM | POA: Insufficient documentation

## 2024-04-30 NOTE — Progress Notes (Unsigned)
 [facility] [address], [address] [place], [place] [address] [phone]  Date:  05/02/2024   Name:  Debbie Hayes   DOB:  07-Apr-1951   MRN:  [id number]  PCP:  Watt Harlene BROCKS, MD    Chief Complaint: No chief complaint on file.   History of Present Illness:  Debbie Hayes is a 73 y.o. very pleasant female patient who presents with the following:  Patient seen today with concern about a swollen and painful foot. She had a left total knee about 5 weeks ago History of diabetes under good control, insomnia, arthritis and chronic joint pains, CAD, osteoporosis, migraine headaches, sleep apnea, hyperlipidemia, hypothyroidism  Date of surgery 03/22/2024 She was seen in urgent care 9/24 for foot swelling-they started her on Keflex , discussed doing lower extremity ultrasound but this has not been done as of yet  Lab Results  Component Value Date   HGBA1C 6.3 (H) 03/09/2024   Discussed the use of AI scribe software for clinical note transcription with the patient, who gave verbal consent to proceed.  History of Present Illness    . Patient Active Problem List   Diagnosis Date Noted   Pain of right thumb 02/26/2024   Precordial pain 12/23/2023   Primary osteoarthritis of left knee with subchondral insufficiency fractures 09/23/2023   History of arthroplasty of right knee 07/03/2023   Primary osteoarthritis of right shoulder 07/03/2023   Coronary artery disease involving native coronary artery of native heart without angina pectoris 05/25/2023   Agatston coronary artery calcium  score greater than 400 11/06/2022   Statin myopathy 11/06/2022   Frequent UTI 10/29/2022   Wart of hand 09/30/2022   Acute cystitis without hematuria 05/14/2021   Medial epicondylitis, right 03/25/2021   Trigger thumb, right thumb 11/19/2020   Right wrist pain 10/23/2020   Osteoporosis 04/05/2020   Trigger finger, left middle finger  07/27/2019   Chronic neck pain 11/10/2018   Orthopedic hardware present 02/17/2018   Controlled diabetes mellitus type 2 with complications (HCC) 12/15/2017   Intractable chronic migraine without aura and with status migrainosus 12/15/2017   Arthralgia 12/15/2017   Joint pain 12/15/2017   Migraine, transformed 12/15/2017   Chronic migraine without aura, with intractable migraine, so stated, with status migrainosus 12/15/2017   Pain of left shoulder joint on movement 10/19/2017   OSA (obstructive sleep apnea) 06/26/2017   Snoring 03/25/2016   Palpitations 03/25/2016   Headache 03/25/2016   Sensory disorder of trigeminal nerve 11/26/2015   Spondylolisthesis of lumbar region 08/09/2014   Lumbar adjacent segment disease with spondylolisthesis 08/09/2014   Back pain 02/17/2014   Macular degeneration 02/17/2014   Backache 02/17/2014   Other and unspecified hyperlipidemia 10/14/2013   Hyperlipidemia 10/14/2013   Onychomycosis 12/01/2012   Arthritis 04/12/2012   Hypothyroidism 04/12/2012    Past Medical History:  Diagnosis Date   Allergy    Asthma    Cancer (HCC)    thyroid  cancer   Cataract    Chronic migraine BOTOX INJECTION EVERY 3 MONTHS   Chronic pain in right shoulder    Constipation    Coronary artery disease    Diabetes mellitus    Disorder of inner ear CAUSES VERTIGO OCCASIONALLY   Fibromyalgia    GERD (gastroesophageal reflux disease)    Hemorrhoid    History of bladder infections    History of thyroid  cancer 2009  S/P TOTAL THYROIDECTOMY AND RADIATION   Hyperlipidemia  Hypertension CARDIOLOGIST- DR BLANCA- WILL REQUEST LATE NOTE   DENIES S & S   Hypothyroidism    Insomnia    Left shoulder pain    Macular degeneration    MRSA infection    nasal treated more than 15 years ago   Normal nuclear stress test 01/25/2009   OA (osteoarthritis) JOINTS   Osteopenia    Osteoporosis    Painful orthopaedic hardware    left foot   Pneumonia    PONV (postoperative  nausea and vomiting) SEVERE   Rotator cuff disorder LEFT SHOULDER RTC IMPINGMENT   Sleep apnea    mouth guard, no cpap    Sleep apnea in adult 06/26/2017   Spondylolisthesis of lumbar region    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   Wears glasses     Past Surgical History:  Procedure Laterality Date   BACK SURGERY     Dr Mavis- spondylosis   BILATERAL CARPAL TUNNEL RELEASE  1994   BILATERAL ELBOW SURG.  1999   BILATERAL SALPINGOOPHECTOMY  1993   POST-OP URETER REPAIR 12 DAYS AFTER    CATARACT EXTRACTION, BILATERAL     COLONOSCOPY     EYE SURGERY     FOOT ARTHRODESIS Left 07/10/2016   Procedure: LEFT 2ND TARSAL METATARSAL ARTHRODESIS  GASTROC RECESSION LEFT LAPIDUS MODIFIED MCBRIDE BUIONECTOMY;  Surgeon: Norleen Armor, MD;  Location: [facility];  Service: Orthopedics;  Laterality: Left;   GASTROC RECESSION EXTREMITY Left 07/10/2016   Procedure: GASTROC RECESSION EXTREMITY;  Surgeon: Norleen Armor, MD;  Location: [facility];  Service: Orthopedics;  Laterality: Left;   HARDWARE REMOVAL Left 05/20/2018   Procedure: Left foot removal of deep implants;  Surgeon: Armor Norleen, MD;  Location: [facility];  Service: Orthopedics;  Laterality: Left;    LEFT THUMB JOINT REPLACEMENT  2005   RIGHT DONE IN 2004   OOPHORECTOMY     POLYPECTOMY     RIGHT KNEE ARTHROSCOPY  X2  BEFORE 2011   RIGHT KNEE ARTHROSCOPY/ PARTIAL LATERAL MENISECTOMY/ TRICOMPARTMENT CHONDROPLASTY/ DECOMPRESSION CYST  10/26/2009   RIGHT KNEE CLOSED MANIPULATION  09/11/2010   RIGHT SHOULDER ARTHROSCOPY  2010  &  2004   TIBIA CYST REMOVED AND ORIF LEG FX  1958   TONSILLECTOMY  1968   TOTAL KNEE ARTHROPLASTY  07/16/2010   RIGHT   TOTAL KNEE ARTHROPLASTY Left 03/22/2024   Procedure: ARTHROPLASTY, KNEE, TOTAL;  Surgeon: Josefina Chew, MD;  Location: [facility];  Service: Orthopedics;  Laterality: Left;   TOTAL THYROIDECTOMY  2009   CANCER  (POST-OP BLEED)  AND RADIATION TX    trigger finger repair      07/2023   trigger fingers Right 2013   3rd and 4th fingers   UPPER GASTROINTESTINAL ENDOSCOPY     VAGINAL HYSTERECTOMY  1990    Social History   Tobacco Use   Smoking status: Never   Smokeless tobacco: Never  Vaping Use   Vaping status: Never Used  Substance Use Topics   Alcohol use: Yes    Comment: RARE   Drug use: No    Family History  Problem Relation Age of Onset   Alzheimer's disease Mother    Migraines Mother    Heart disease Father    Cirrhosis Father    Heart attack Father    Macular degeneration Father    Migraines Sister    Migraines Maternal Grandmother    Migraines Niece    Colon cancer Neg Hx  Esophageal cancer Neg Hx    Rectal cancer Neg Hx    Stomach cancer Neg Hx    Colon polyps Neg Hx     Allergies  Allergen Reactions   Sulfa Antibiotics Hives   Statins Other (See Comments)    Pt has tried multiple and cannot tolerate    Medication list has been reviewed and updated.  Current Outpatient Medications on File Prior to Visit  Medication Sig Dispense Refill   acetaminophen  (TYLENOL ) 500 MG tablet Take 500 mg by mouth every 6 (six) hours as needed for moderate pain (pain score 4-6).     Albuterol  Sulfate (PROAIR  RESPICLICK) 108 (90 Base) MCG/ACT AEPB Inhale 2 puffs every 4-6 hours as needed for wheezing or shortness of breath 1 each 12   alendronate  (FOSAMAX ) 70 MG tablet TAKE 1 TABLET(70 MG) BY MOUTH EVERY 7 DAYS WITH A FULL GLASS OF WATER  AND ON AN EMPTY STOMACH 12 tablet 1   Atogepant  (QULIPTA ) 60 MG TABS Take 1 tablet (60 mg total) by mouth daily. 90 tablet 3   blood glucose meter kit and supplies KIT Test blood sugar once daily. Dx code: E11.9 1 each 0   Blood Glucose Monitoring Suppl (FREESTYLE LITE) DEVI Check blood sugar no more than twice daily 1 each 0   Blood Glucose Monitoring Suppl (ONE TOUCH ULTRA 2) w/Device KIT Dispense one meter and test strips, lancets- 100 of each with PRN RF 1 kit 0   chlorhexidine   (HIBICLENS ) 4 % external liquid Apply 15 mLs (1 Application total) topically as directed for 30 doses. Use as directed daily for 5 days every other week for 6 weeks. 946 mL 1   clopidogrel  (PLAVIX ) 75 MG tablet Take 1 tablet (75 mg total) by mouth daily. 90 tablet 3   Cranberry 4200 MG CAPS Take 4,200 mg by mouth daily.     dicyclomine  (BENTYL ) 20 MG tablet Take 1 tablet (20 mg total) by mouth 4 (four) times daily as needed for spasms. 60 tablet 1   estradiol  (ESTRACE ) 0.1 MG/GM vaginal cream Place 1 Applicatorful vaginally 3 (three) times a week. (Patient taking differently: Place 1 Applicatorful vaginally 2 (two) times a week.) 42.5 g 12   Evolocumab  (REPATHA  SURECLICK) 140 MG/ML SOAJ Inject 140 mg into the skin every 14 (fourteen) days. 6 mL 3   famotidine  (PEPCID ) 40 MG tablet Take 40 mg by mouth daily.     Fexofenadine HCl (ALLEGRA PO) Take 180 mg by mouth daily.     fluticasone  (FLOVENT  HFA) 110 MCG/ACT inhaler Inhale 1-2 puffs into the lungs 2 (two) times daily. 1 each 12   glucose blood (FREESTYLE LITE) test strip CHECK BLOOD SUGARS NO MORE THAN TWICE DAILY 100 each 12   Guaifenesin (MUCINEX MAXIMUM STRENGTH) 1200 MG TB12 Take 1,200 mg by mouth daily as needed (congestion).     HYDROmorphone  (DILAUDID ) 2 MG tablet Take 1 tablet (2 mg total) by mouth every 4 (four) hours as needed (For Breakthrough pain not controlled by baseline oxycodone ). 28 tablet 0   Lancets (FREESTYLE) lancets Check blood sugars no more than twice daily 100 each 12   levothyroxine  (SYNTHROID ) 125 MCG tablet TAKE 1 TABLET BY MOUTH BEFORE BREAKFAST 90 tablet 3   losartan  (COZAAR ) 25 MG tablet Take 1 tablet (25 mg total) by mouth daily. 90 tablet 0   Magnesium 400 MG CAPS Take 800 mg by mouth daily.     metFORMIN  (GLUCOPHAGE ) 500 MG tablet Take 1 tablet (500 mg total) by mouth  daily. 90 tablet 3   methocarbamol  (ROBAXIN ) 500 MG tablet Take 1 tablet (500 mg total) by mouth every 8 (eight) hours as needed for muscle spasms.  30 tablet 0   metoprolol  succinate (TOPROL -XL) 50 MG 24 hr tablet TAKE 1 TABLET(50 MG) BY MOUTH DAILY WITH OR IMMEDIATELY FOLLOWING A MEAL 90 tablet 1   montelukast  (SINGULAIR ) 10 MG tablet Take 1 tablet (10 mg total) by mouth at bedtime. 90 tablet 3   Olopatadine-Mometasone  (RYALTRIS) 665-25 MCG/ACT SUSP Place 2 sprays in each nostril twice daily 29 g PRN   ondansetron  (ZOFRAN -ODT) 8 MG disintegrating tablet Take 1 tablet (8 mg total) by mouth every 8 (eight) hours as needed for nausea or vomiting. 20 tablet 0   Oxycodone  HCl 10 MG TABS Take 1-1.5 tablets (10-15 mg total) by mouth every 8 (eight) hours as needed. 50 tablet 0   Polyethylene Glycol 3350  (MIRALAX PO) Take 17 g by mouth daily as needed (constipation).     Rimegepant Sulfate (NURTEC) 75 MG TBDP Take 75 mg by mouth daily as needed (migraine).     saccharomyces boulardii (FLORASTOR) 250 MG capsule Take 250 mg by mouth 2 (two) times daily.     Semaglutide , 1 MG/DOSE, 4 MG/3ML SOPN Inject 1 mg into the skin once a week. 9 mL 3   traZODone  (DESYREL ) 100 MG tablet Take 2.5 tablets (250 mg total) by mouth at bedtime. 225 tablet 0   zolpidem  (AMBIEN ) 5 MG tablet TAKE 1 TABLET(5 MG) BY MOUTH AT BEDTIME AS NEEDED FOR SLEEP 90 tablet 1   No current facility-administered medications on file prior to visit.    Review of Systems:  As per HPI- otherwise negative.   Physical Examination: There were no vitals filed for this visit. There were no vitals filed for this visit. There is no height or weight on file to calculate BMI. Ideal Body Weight:    GEN: no acute distress. HEENT: Atraumatic, Normocephalic.  Ears and Nose: No external deformity. CV: RRR, No M/G/R. No JVD. No thrill. No extra heart sounds. PULM: CTA B, no wheezes, crackles, rhonchi. No retractions. No resp. distress. No accessory muscle use. ABD: S, NT, ND, +BS. No rebound. No HSM. EXTR: No c/c/e PSYCH: Normally interactive. Conversant.    Assessment and Plan: No  diagnosis found.  Assessment & Plan   Signed Harlene Schroeder, MD

## 2024-05-02 ENCOUNTER — Encounter: Payer: Self-pay | Admitting: Family Medicine

## 2024-05-02 ENCOUNTER — Ambulatory Visit (INDEPENDENT_AMBULATORY_CARE_PROVIDER_SITE_OTHER): Admitting: Family Medicine

## 2024-05-02 VITALS — BP 120/76 | HR 78 | Ht 68.0 in | Wt 156.2 lb

## 2024-05-02 DIAGNOSIS — M79672 Pain in left foot: Secondary | ICD-10-CM

## 2024-05-02 NOTE — Patient Instructions (Addendum)
 You can take up to 100 mg of tramadol  every 8 hours as needed for more severe pain I will be in touch with your labs asap

## 2024-05-03 ENCOUNTER — Encounter: Payer: Self-pay | Admitting: Physical Therapy

## 2024-05-03 ENCOUNTER — Ambulatory Visit: Admitting: Physical Therapy

## 2024-05-03 ENCOUNTER — Telehealth (HOSPITAL_BASED_OUTPATIENT_CLINIC_OR_DEPARTMENT_OTHER): Payer: Self-pay

## 2024-05-03 ENCOUNTER — Encounter: Payer: Self-pay | Admitting: Family Medicine

## 2024-05-03 ENCOUNTER — Ambulatory Visit (HOSPITAL_BASED_OUTPATIENT_CLINIC_OR_DEPARTMENT_OTHER)

## 2024-05-03 DIAGNOSIS — M25562 Pain in left knee: Secondary | ICD-10-CM | POA: Diagnosis not present

## 2024-05-03 DIAGNOSIS — R262 Difficulty in walking, not elsewhere classified: Secondary | ICD-10-CM

## 2024-05-03 DIAGNOSIS — R600 Localized edema: Secondary | ICD-10-CM

## 2024-05-03 DIAGNOSIS — M25662 Stiffness of left knee, not elsewhere classified: Secondary | ICD-10-CM

## 2024-05-03 LAB — URIC ACID: Uric Acid, Serum: 3 mg/dL (ref 2.4–7.0)

## 2024-05-03 LAB — SEDIMENTATION RATE: Sed Rate: 8 mm/h (ref 0–30)

## 2024-05-03 LAB — RHEUMATOID FACTOR: Rheumatoid fact SerPl-aCnc: 13 [IU]/mL (ref <14)

## 2024-05-03 LAB — C-REACTIVE PROTEIN: CRP: <0.5 mg/dL (ref 0.5–20.0)

## 2024-05-03 NOTE — Therapy (Signed)
 OUTPATIENT PHYSICAL THERAPY LOWER EXTREMITY     Patient Name: Debbie Hayes MRN: [id number] DOB:Mar 29, 1951, 73 y.o., female Today's Date: 05/03/2024  END OF SESSION:  PT End of Session - 05/03/24 1315     Visit Number 11    Date for Recertification  06/29/24    Authorization Type [employer] medicare    PT Start Time 1313    PT Stop Time 1357    PT Time Calculation (min) 44 min    Activity Tolerance Patient tolerated treatment well    Behavior During Therapy WFL for tasks assessed/performed            Past Medical History:  Diagnosis Date   Allergy    Asthma    Cancer (HCC)    thyroid  cancer   Cataract    Chronic migraine BOTOX INJECTION EVERY 3 MONTHS   Chronic pain in right shoulder    Constipation    Coronary artery disease    Diabetes mellitus    Disorder of inner ear CAUSES VERTIGO OCCASIONALLY   Fibromyalgia    GERD (gastroesophageal reflux disease)    Hemorrhoid    History of bladder infections    History of thyroid  cancer 2009  S/P TOTAL THYROIDECTOMY AND RADIATION   Hyperlipidemia    Hypertension CARDIOLOGIST- DR BLANCA- WILL REQUEST LATE NOTE   DENIES S & S   Hypothyroidism    Insomnia    Left shoulder pain    Macular degeneration    MRSA infection    nasal treated more than 15 years ago   Normal nuclear stress test 01/25/2009   OA (osteoarthritis) JOINTS   Osteopenia    Osteoporosis    Painful orthopaedic hardware    left foot   Pneumonia    PONV (postoperative nausea and vomiting) SEVERE   Rotator cuff disorder LEFT SHOULDER RTC IMPINGMENT   Sleep apnea    mouth guard, no cpap    Sleep apnea in adult 06/26/2017   Spondylolisthesis of lumbar region    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   Wears glasses    Past Surgical History:  Procedure Laterality Date   BACK SURGERY     Dr Mavis- spondylosis   BILATERAL CARPAL TUNNEL RELEASE  1994   BILATERAL ELBOW SURG.  1999   BILATERAL SALPINGOOPHECTOMY  1993   POST-OP URETER REPAIR 12  DAYS AFTER    CATARACT EXTRACTION, BILATERAL     COLONOSCOPY     EYE SURGERY     FOOT ARTHRODESIS Left 07/10/2016   Procedure: LEFT 2ND TARSAL METATARSAL ARTHRODESIS  GASTROC RECESSION LEFT [place]ID[place] MODIFIED MCBRIDE BUIONECTOMY;  Surgeon: Norleen Armor, MD;  Location: [facility];  Service: Orthopedics;  Laterality: Left;   GASTROC RECESSION EXTREMITY Left 07/10/2016   Procedure: GASTROC RECESSION EXTREMITY;  Surgeon: Norleen Armor, MD;  Location: [facility];  Service: Orthopedics;  Laterality: Left;   HARDWARE REMOVAL Left 05/20/2018   Procedure: Left foot removal of deep implants;  Surgeon: Armor Norleen, MD;  Location: [facility];  Service: Orthopedics;  Laterality: Left;    LEFT THUMB JOINT REPLACEMENT  2005   RIGHT DONE IN 2004   OOPHORECTOMY     POLYPECTOMY     RIGHT KNEE ARTHROSCOPY  X2  BEFORE 2011   RIGHT KNEE ARTHROSCOPY/ PARTIAL LATERAL MENISECTOMY/ TRICOMPARTMENT CHONDROPLASTY/ DECOMPRESSION CYST  10/26/2009   RIGHT KNEE CLOSED MANIPULATION  09/11/2010   RIGHT SHOULDER ARTHROSCOPY  2010  &  2004   TIBIA CYST REMOVED  AND ORIF LEG FX  1958   TONSILLECTOMY  1968   TOTAL KNEE ARTHROPLASTY  07/16/2010   RIGHT   TOTAL KNEE ARTHROPLASTY Left 03/22/2024   Procedure: ARTHROPLASTY, KNEE, TOTAL;  Surgeon: Josefina Chew, MD;  Location: [facility];  Service: Orthopedics;  Laterality: Left;   TOTAL THYROIDECTOMY  2009   CANCER  (POST-OP BLEED)  AND RADIATION TX   trigger finger repair      07/2023   trigger fingers Right 2013   3rd and 4th fingers   UPPER GASTROINTESTINAL ENDOSCOPY     VAGINAL HYSTERECTOMY  1990   Patient Active Problem List   Diagnosis Date Noted   Pain of right thumb 02/26/2024   Precordial pain 12/23/2023   Primary osteoarthritis of left knee with subchondral insufficiency fractures 09/23/2023   History of arthroplasty of right knee 07/03/2023   Primary osteoarthritis of right shoulder 07/03/2023   Coronary artery  disease involving native coronary artery of native heart without angina pectoris 05/25/2023   Agatston coronary artery calcium  score greater than 400 11/06/2022   Statin myopathy 11/06/2022   Frequent UTI 10/29/2022   Wart of hand 09/30/2022   Acute cystitis without hematuria 05/14/2021   Medial epicondylitis, right 03/25/2021   Trigger thumb, right thumb 11/19/2020   Right wrist pain 10/23/2020   Osteoporosis 04/05/2020   Trigger finger, left middle finger 07/27/2019   Chronic neck pain 11/10/2018   Orthopedic hardware present 02/17/2018   Controlled diabetes mellitus type 2 with complications (HCC) 12/15/2017   Intractable chronic migraine without aura and with status migrainosus 12/15/2017   Arthralgia 12/15/2017   Joint pain 12/15/2017   Migraine, transformed 12/15/2017   Chronic migraine without aura, with intractable migraine, so stated, with status migrainosus 12/15/2017   Pain of left shoulder joint on movement 10/19/2017   OSA (obstructive sleep apnea) 06/26/2017   Snoring 03/25/2016   Palpitations 03/25/2016   Headache 03/25/2016   Sensory disorder of trigeminal nerve 11/26/2015   Spondylolisthesis of lumbar region 08/09/2014   Lumbar adjacent segment disease with spondylolisthesis 08/09/2014   Back pain 02/17/2014   Macular degeneration 02/17/2014   Backache 02/17/2014   Other and unspecified hyperlipidemia 10/14/2013   Hyperlipidemia 10/14/2013   Onychomycosis 12/01/2012   Arthritis 04/12/2012   Hypothyroidism 04/12/2012    PCP: Copland, MD  REFERRING PROVIDER: Josefina, MD  REFERRING DIAG: s/p Left TKA  THERAPY DIAG:  Acute pain of left knee  Stiffness of left knee, not elsewhere classified  Difficulty in walking, not elsewhere classified  Localized edema  Rationale for Evaluation and Treatment: Rehabilitation  ONSET DATE: 03/22/24  SUBJECTIVE:   SUBJECTIVE STATEMENT: Patient has been having foot issues, ruled out gout and a stress fracture, not  using a device to walk but is slow and careful  PAIN:  Are you having pain? Left knee 4-5/10 PRECAUTIONS: None  RED FLAGS: None   WEIGHT BEARING RESTRICTIONS: No  FALLS:  Has patient fallen in last 6 months? No  LIVING ENVIRONMENT: Lives with: lives with their family Lives in: House/apartment Stairs: Yes: Internal: 2 steps; can reach both Has following equipment at home: Single point cane, Walker - 2 wheeled, Crutches, and shower chair  OCCUPATION: retired  PLOF: Independent and shopping and housework  PATIENT GOALS: have normal walk without device, less pain  NEXT MD VISIT: 04/06/24  OBJECTIVE:  Note: Objective measures were completed at Evaluation unless otherwise noted.   COGNITION: Overall cognitive status: Within functional limits for tasks assessed     SENSATION: [facility]  EDEMA:  Circumferential: mid patella 47 cm on the left, 40 cm on the right  MUSCLE LENGTH: Tight calves and HS  POSTURE: rounded shoulders and forward head  PALPATION: Tender, ecchymosis left thigh and knee, inner and posterior  LOWER EXTREMITY ROM:  Active ROM AROM LEft eval PROM Left eval LLE 04/01/24 AROM  LLE 04/04/24 04/26/24 04/29/24 05/03/24 AROM/PROM  Hip flexion         Hip extension         Hip abduction         Hip adduction         Hip internal rotation         Hip external rotation         Knee flexion 65 70 82 85 112 110 112/117  Knee extension 15 9 5 5  0 0 4/0  Ankle dorsiflexion         Ankle plantarflexion         Ankle inversion         Ankle eversion          (Blank rows = not tested)  LOWER EXTREMITY MMT:  MMT Right eval Left eval  Hip flexion    Hip extension    Hip abduction    Hip adduction    Hip internal rotation    Hip external rotation    Knee flexion  2  Knee extension  2  Ankle dorsiflexion    Ankle plantarflexion    Ankle inversion    Ankle eversion     (Blank rows = not tested)  LOWER EXTREMITY SPECIAL TESTS:  Knee special tests:  quad lag, ballotable patella  FUNCTIONAL TESTS:  Timed up and go (TUG): 17 seconds  GAIT: Distance walked: 60 feet Assistive device utilized: Environmental consultant - 2 wheeled Level of assistance: Modified independence Comments: mild antalgic gait                                                                                                                                TREATMENT DATE:  05/03/24 Nustep level 4 x 6 minutes Bike seat 11 x 4 minutes full revolutions HS curls 20# Leg ext 5# Passive stretch Left LE Joint mobs Scar mobs Left foot on dyna disc motions  AROM 4-112 degrees PROM 0-117 degrees TUG 11 seconds without device  04/29/24 Recheck goals for 10th visit  NuStep L5 x92mins LE only  Bike seat 11 x71mins for ROM Leg ext 5# 2x10 HS curls 20# 2x10 STS 2x10 Leg press 20# 2x10 Step ups 4   04/26/24 PROM, stretching and STW Left knee and ankle/foot Nustep L 5 Func stepping multi directional for ROM Looked at gait but pt struggling d/t pain in foot and great toe AROM 0-112   04/22/24 Nustep L 5 LE only HS curl 20# 2 sets 10. 10# 10 x LLE Knee ext 5# 2 sets 10 PROM flex and ext STW to knee ,  scr and quad 3# LAQ with focus on TKE 2 sets 10 Green tband HS curl 2 sets 10 Act flexion end of session 110  04/19/24 Nustep L 5 LE only Green tband HS curl 2 sets 10 3# LAQ 2 sets 10 3# standing HS curl 2 sets 10 3# 8 in step tap 2 sets 10 6 inch step up fwd and laterally 10 x each Green tband TKE 2 sets 10 PROM flex and ext AROM after 0-103 Leg Press 30# 2 sets 10 Black bar DF and PF 15 x STS wt ball 10 x Resisted gait fwd,back and side ways 20#   04/14/24 Nustep L 5 LE only LAQ 2# 2 sets 10 HS curls red tband 2 sets 10 ( decreased to red to increase ROM) SAQ 10 x TKE with manual resistance 10 x 6 inch step up fwd and laterally 10 x each PROM flex and ext AROM sitting EOB  2-100 Resisted squats 2 sets 10 green tband Leg press 20 # 2 sets  10    04/11/24 NuStep L5x63mins  LAQ 2# 2x10 HS curls green 2x10 Gentle PROM flexion/ext, patellar mobs AROM 95d STS 2x10 Step ups 6  HEP review  04/07/24 Nustep L 4 LAQ 2 sets 10 HS curl 2 sets 10 Leg press 20# 2 sets 10 Step stretch, step tap and calf stretch on step -HEP review and answered questions. STM to the quad and ITB area Gentle PROM into flexion and ext- tolerated fair if gentle    04/04/24 Bike partial reps x 5 minutes Gait without device x 200 feet LAQ 2x10 SAQ 2x10 STM and scar mobilization to the left knee  Feet on ball K2C, rotation, small bridge and isometric abs Ball b/n knees squeeze STM to the quad and ITB area Gentle PROM into flexion she does not tolerate this well Gait without device 200 feet SBA  04/01/24 Nustep L 4 PROM flex and ext Pat mobs Gentle STW to LEft thigh LAQ 2 sets 10 Yellow tband HS curl 2 sets 10 Step stretch Step up Calf stretch on step VASO low flat 6 min 03/30/24 Evaluation  Nustep level 3 x 5 minutes Vaso low pressure 34 degrees with elevation   PATIENT EDUCATION:  Education details: POC/HEP Person educated: Patient Education method: Explanation, Facilities manager, and Handouts Education comprehension: verbalized understanding  HOME EXERCISE PROGRAM: Access Code: [email or web address] URL: [email or web address] Date: 04/11/2024 Prepared by: Almetta Fam  Exercises - Standing Knee Flexion Stretch on Step  - 3- x daily - 7 x weekly - 1 sets - 5-10 reps - 5-10 hold - Heel Raise on Step  - 3 x daily - 7 x weekly - 1 sets - 10 reps - Standing Gastroc Stretch on Step  - 3 x daily - 7 x weekly - 1 sets - 10 reps - Forward Step Touch  - 3 x daily - 7 x weekly - 1 sets - 10 reps - Seated Knee Flexion Slide  - 3 x daily - 7 x weekly - 1 sets - 10 reps - Sit to Stand  - 3 x daily - 7 x weekly - 1 sets - 10 reps  ASSESSMENT:  CLINICAL IMPRESSION: patient had some flare up in the left foot, was tested for gout  and stress fracture and both were negative, did Keflex  and then a round of prednisone , foot is a little better was able to put on a shoe yesterday. Has made good progress overall and ROM is  good. She was able to do full revs on the bike for the first time last Friday. Still some difficulty with steps mostly coming down,  She reports not being able to be as diligent with her HEP especially with standing exercises due to foot pain. Does well in session today despite foot problems.   OBJECTIVE IMPAIRMENTS: Abnormal gait, cardiopulmonary status limiting activity, decreased activity tolerance, decreased balance, decreased endurance, decreased mobility, difficulty walking, decreased ROM, decreased strength, increased edema, increased muscle spasms, impaired flexibility, impaired vision/preception, improper body mechanics, postural dysfunction, and pain.   REHAB POTENTIAL: Good  CLINICAL DECISION MAKING: Evolving/moderate complexity  EVALUATION COMPLEXITY: Moderate   GOALS: Goals reviewed with patient? Yes  SHORT TERM GOALS: Target date: 04/20/24 Independent with initial HEP Baseline: Goal status: 04/01/24 MET  LONG TERM GOALS: Target date: 06/29/24  Independent with advanced HEP Baseline:  Goal status: progressing10/7/25  2.  Independent with RICE Baseline:  Goal status: MET 04/14/24  3.  Decrease pain 50% for better function and quality of life Baseline:  Goal status: progressing 04/14/24, not 50% yet progressing 04/29/24  4.  Decrease TUG to 12 seconds without device Baseline:  Goal status: 04/22/24 MET, 11 seconds without device 05/03/24  5.  Increase AROM of the left knee to 4-116 degrees flexion Baseline:  Goal status: 04/14/24 2-100 progressing  and 04/22/24, 0-110d 04/29/24 progressing  6.  Go up and down steps step over step Baseline:  Goal status: 04/14/24 progressing, progressing 04/29/24   PLAN:  PT FREQUENCY: 2x/week  PT DURATION: 12 weeks  PLANNED INTERVENTIONS: [id number]-  PT Re-evaluation, 97110-Therapeutic exercises, 97530- Therapeutic activity, 97112- Neuromuscular re-education, 97535- Self Care, [id number]- Manual therapy, [phone]- Gait training, [phone]- Electrical stimulation (unattended), 97016- Vasopneumatic device, Patient/Family education, Balance training, Stair training, Taping, Joint mobilization, and Cryotherapy  PLAN FOR NEXT SESSION: progress ROM and strength   Brinlyn Cena W, PT 05/03/2024, 1:17 PM

## 2024-05-06 ENCOUNTER — Ambulatory Visit: Admitting: Physical Therapy

## 2024-05-06 DIAGNOSIS — M25562 Pain in left knee: Secondary | ICD-10-CM | POA: Diagnosis not present

## 2024-05-06 DIAGNOSIS — R600 Localized edema: Secondary | ICD-10-CM

## 2024-05-06 DIAGNOSIS — R262 Difficulty in walking, not elsewhere classified: Secondary | ICD-10-CM

## 2024-05-06 DIAGNOSIS — M25662 Stiffness of left knee, not elsewhere classified: Secondary | ICD-10-CM

## 2024-05-06 NOTE — Therapy (Signed)
 OUTPATIENT PHYSICAL THERAPY LOWER EXTREMITY     Patient Name: Debbie Hayes MRN: [id number] DOB:[birth year], 73 y.o., female Today's Date: 05/06/2024  END OF SESSION:  PT End of Session - 05/06/24 1014     Visit Number 12    Date for Recertification  06/29/24    Authorization Type [employer] medicare    PT Start Time 1015    PT Stop Time 1055    PT Time Calculation (min) 40 min            Past Medical History:  Diagnosis Date   Allergy    Asthma    Cancer (HCC)    thyroid  cancer   Cataract    Chronic migraine BOTOX INJECTION EVERY 3 MONTHS   Chronic pain in right shoulder    Constipation    Coronary artery disease    Diabetes mellitus    Disorder of inner ear CAUSES VERTIGO OCCASIONALLY   Fibromyalgia    GERD (gastroesophageal reflux disease)    Hemorrhoid    History of bladder infections    History of thyroid  cancer 2009  S/P TOTAL THYROIDECTOMY AND RADIATION   Hyperlipidemia    Hypertension CARDIOLOGIST- DR BLANCA- WILL REQUEST LATE NOTE   DENIES S & S   Hypothyroidism    Insomnia    Left shoulder pain    Macular degeneration    MRSA infection    nasal treated more than 15 years ago   Normal nuclear stress test 01/25/2009   OA (osteoarthritis) JOINTS   Osteopenia    Osteoporosis    Painful orthopaedic hardware    left foot   Pneumonia    PONV (postoperative nausea and vomiting) SEVERE   Rotator cuff disorder LEFT SHOULDER RTC IMPINGMENT   Sleep apnea    mouth guard, no cpap    Sleep apnea in adult 06/26/2017   Spondylolisthesis of lumbar region    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   Wears glasses    Past Surgical History:  Procedure Laterality Date   BACK SURGERY     Dr Mavis- spondylosis   BILATERAL CARPAL TUNNEL RELEASE  1994   BILATERAL ELBOW SURG.  1999   BILATERAL SALPINGOOPHECTOMY  1993   POST-OP URETER REPAIR 12 DAYS AFTER    CATARACT EXTRACTION, BILATERAL     COLONOSCOPY     EYE SURGERY     FOOT ARTHRODESIS Left 07/10/2016    Procedure: LEFT 2ND TARSAL METATARSAL ARTHRODESIS  GASTROC RECESSION LEFT [place] MODIFIED MCBRIDE BUIONECTOMY;  Surgeon: Norleen Armor, MD;  Location: [facility];  Service: Orthopedics;  Laterality: Left;   GASTROC RECESSION EXTREMITY Left 07/10/2016   Procedure: GASTROC RECESSION EXTREMITY;  Surgeon: Norleen Armor, MD;  Location: [facility];  Service: Orthopedics;  Laterality: Left;   HARDWARE REMOVAL Left 05/20/2018   Procedure: Left foot removal of deep implants;  Surgeon: Armor Norleen, MD;  Location: [facility];  Service: Orthopedics;  Laterality: Left;    LEFT THUMB JOINT REPLACEMENT  2005   RIGHT DONE IN 2004   OOPHORECTOMY     POLYPECTOMY     RIGHT KNEE ARTHROSCOPY  X2  BEFORE 2011   RIGHT KNEE ARTHROSCOPY/ PARTIAL LATERAL MENISECTOMY/ TRICOMPARTMENT CHONDROPLASTY/ DECOMPRESSION CYST  10/26/2009   RIGHT KNEE CLOSED MANIPULATION  09/11/2010   RIGHT SHOULDER ARTHROSCOPY  2010  &  2004   TIBIA CYST REMOVED AND ORIF LEG FX  1958   TONSILLECTOMY  1968   TOTAL KNEE ARTHROPLASTY  07/16/2010  RIGHT   TOTAL KNEE ARTHROPLASTY Left 03/22/2024   Procedure: ARTHROPLASTY, KNEE, TOTAL;  Surgeon: Josefina Chew, MD;  Location: [facility];  Service: Orthopedics;  Laterality: Left;   TOTAL THYROIDECTOMY  2009   CANCER  (POST-OP BLEED)  AND RADIATION TX   trigger finger repair      07/2023   trigger fingers Right 2013   3rd and 4th fingers   UPPER GASTROINTESTINAL ENDOSCOPY     VAGINAL HYSTERECTOMY  1990   Patient Active Problem List   Diagnosis Date Noted   Pain of right thumb 02/26/2024   Precordial pain 12/23/2023   Primary osteoarthritis of left knee with subchondral insufficiency fractures 09/23/2023   History of arthroplasty of right knee 07/03/2023   Primary osteoarthritis of right shoulder 07/03/2023   Coronary artery disease involving native coronary artery of native heart without angina pectoris 05/25/2023   Agatston coronary artery  calcium  score greater than 400 11/06/2022   Statin myopathy 11/06/2022   Frequent UTI 10/29/2022   Wart of hand 09/30/2022   Acute cystitis without hematuria 05/14/2021   Medial epicondylitis, right 03/25/2021   Trigger thumb, right thumb 11/19/2020   Right wrist pain 10/23/2020   Osteoporosis 04/05/2020   Trigger finger, left middle finger 07/27/2019   Chronic neck pain 11/10/2018   Orthopedic hardware present 02/17/2018   Controlled diabetes mellitus type 2 with complications (HCC) 12/15/2017   Intractable chronic migraine without aura and with status migrainosus 12/15/2017   Arthralgia 12/15/2017   Joint pain 12/15/2017   Migraine, transformed 12/15/2017   Chronic migraine without aura, with intractable migraine, so stated, with status migrainosus 12/15/2017   Pain of left shoulder joint on movement 10/19/2017   OSA (obstructive sleep apnea) 06/26/2017   Snoring 03/25/2016   Palpitations 03/25/2016   Headache 03/25/2016   Sensory disorder of trigeminal nerve 11/26/2015   Spondylolisthesis of lumbar region 08/09/2014   Lumbar adjacent segment disease with spondylolisthesis 08/09/2014   Back pain 02/17/2014   Macular degeneration 02/17/2014   Backache 02/17/2014   Other and unspecified hyperlipidemia 10/14/2013   Hyperlipidemia 10/14/2013   Onychomycosis 12/01/2012   Arthritis 04/12/2012   Hypothyroidism 04/12/2012    PCP: Copland, MD  REFERRING PROVIDER: Josefina, MD  REFERRING DIAG: s/p Left TKA  THERAPY DIAG:  Acute pain of left knee  Stiffness of left knee, not elsewhere classified  Difficulty in walking, not elsewhere classified  Localized edema  Rationale for Evaluation and Treatment: Rehabilitation  ONSET DATE: 03/22/24  SUBJECTIVE:   SUBJECTIVE STATEMENT:    foot much better. MD very pleased. Resumed all ex. Started on gabapentin .I want to do less reps because if not I am just wiped out.   PAIN:  Are you having pain? Left knee 4-5/10 PRECAUTIONS:  None  RED FLAGS: None   WEIGHT BEARING RESTRICTIONS: No  FALLS:  Has patient fallen in last 6 months? No  LIVING ENVIRONMENT: Lives with: lives with their family Lives in: House/apartment Stairs: Yes: Internal: 2 steps; can reach both Has following equipment at home: Single point cane, Walker - 2 wheeled, Crutches, and shower chair  OCCUPATION: retired  PLOF: Independent and shopping and housework  PATIENT GOALS: have normal walk without device, less pain  NEXT MD VISIT: 04/06/24  OBJECTIVE:  Note: Objective measures were completed at Evaluation unless otherwise noted.   COGNITION: Overall cognitive status: Within functional limits for tasks assessed     SENSATION: WFL  EDEMA:  Circumferential: mid patella 47 cm on the left, 40 cm on the  right  MUSCLE LENGTH: Tight calves and HS  POSTURE: rounded shoulders and forward head  PALPATION: Tender, ecchymosis left thigh and knee, inner and posterior  LOWER EXTREMITY ROM:  Active ROM AROM LEft eval PROM Left eval LLE 04/01/24 AROM  LLE 04/04/24 04/26/24 04/29/24 05/03/24 AROM/PROM  Hip flexion         Hip extension         Hip abduction         Hip adduction         Hip internal rotation         Hip external rotation         Knee flexion 65 70 82 85 112 110 112/117  Knee extension 15 9 5 5  0 0 4/0  Ankle dorsiflexion         Ankle plantarflexion         Ankle inversion         Ankle eversion          (Blank rows = not tested)  LOWER EXTREMITY MMT:  MMT Right eval Left eval  Hip flexion    Hip extension    Hip abduction    Hip adduction    Hip internal rotation    Hip external rotation    Knee flexion  2  Knee extension  2  Ankle dorsiflexion    Ankle plantarflexion    Ankle inversion    Ankle eversion     (Blank rows = not tested)  LOWER EXTREMITY SPECIAL TESTS:  Knee special tests: quad lag, ballotable patella  FUNCTIONAL TESTS:  Timed up and go (TUG): 17 seconds  GAIT: Distance  walked: 60 feet Assistive device utilized: Environmental consultant - 2 wheeled Level of assistance: Modified independence Comments: mild antalgic gait                                                                                                                                TREATMENT DATE:  05/06/24 PROM flex and ext HS curl 20# 10 x Knee ext 5# 10 x Bike 5 min full revolutions Nustep L 4 Left foot on dyna disc motions  LAQ 3# 10 x Standing 3# marching, hip abd, ext and HS curl 10 x each    05/03/24 Nustep level 4 x 6 minutes Bike seat 11 x 4 minutes full revolutions HS curls 20# Leg ext 5# Passive stretch Left LE Joint mobs Scar mobs Left foot on dyna disc motions  AROM 4-112 degrees PROM 0-117 degrees TUG 11 seconds without device  04/29/24 Recheck goals for 10th visit  NuStep L5 x31mins LE only  Bike seat 11 x84mins for ROM Leg ext 5# 2x10 HS curls 20# 2x10 STS 2x10 Leg press 20# 2x10 Step ups 4   04/26/24 PROM, stretching and STW Left knee and ankle/foot Nustep L 5 Func stepping multi directional for ROM Looked at gait but pt struggling d/t pain in foot and  great toe AROM 0-112   04/22/24 Nustep L 5 LE only HS curl 20# 2 sets 10. 10# 10 x LLE Knee ext 5# 2 sets 10 PROM flex and ext STW to knee ,scr and quad 3# LAQ with focus on TKE 2 sets 10 Green tband HS curl 2 sets 10 Act flexion end of session 110  04/19/24 Nustep L 5 LE only Green tband HS curl 2 sets 10 3# LAQ 2 sets 10 3# standing HS curl 2 sets 10 3# 8 in step tap 2 sets 10 6 inch step up fwd and laterally 10 x each Green tband TKE 2 sets 10 PROM flex and ext AROM after 0-103 Leg Press 30# 2 sets 10 Black bar DF and PF 15 x STS wt ball 10 x Resisted gait fwd,back and side ways 20#   04/14/24 Nustep L 5 LE only LAQ 2# 2 sets 10 HS curls red tband 2 sets 10 ( decreased to red to increase ROM) SAQ 10 x TKE with manual resistance 10 x 6 inch step up fwd and laterally 10 x  each PROM flex and ext AROM sitting EOB  2-100 Resisted squats 2 sets 10 green tband Leg press 20 # 2 sets 10    04/11/24 NuStep L5x35mins  LAQ 2# 2x10 HS curls green 2x10 Gentle PROM flexion/ext, patellar mobs AROM 95d STS 2x10 Step ups 6  HEP review  04/07/24 Nustep L 4 LAQ 2 sets 10 HS curl 2 sets 10 Leg press 20# 2 sets 10 Step stretch, step tap and calf stretch on step -HEP review and answered questions. STM to the quad and ITB area Gentle PROM into flexion and ext- tolerated fair if gentle    04/04/24 Bike partial reps x 5 minutes Gait without device x 200 feet LAQ 2x10 SAQ 2x10 STM and scar mobilization to the left knee  Feet on ball K2C, rotation, small bridge and isometric abs Ball b/n knees squeeze STM to the quad and ITB area Gentle PROM into flexion she does not tolerate this well Gait without device 200 feet SBA  04/01/24 Nustep L 4 PROM flex and ext Pat mobs Gentle STW to LEft thigh LAQ 2 sets 10 Yellow tband HS curl 2 sets 10 Step stretch Step up Calf stretch on step VASO low flat 6 min 03/30/24 Evaluation  Nustep level 3 x 5 minutes Vaso low pressure 34 degrees with elevation   PATIENT EDUCATION:  Education details: POC/HEP Person educated: Patient Education method: Explanation, Facilities manager, and Handouts Education comprehension: verbalized understanding  HOME EXERCISE PROGRAM: Access Code: [email or web address] URL: [email or web address] Date: 04/11/2024 Prepared by: Almetta Fam  Exercises - Standing Knee Flexion Stretch on Step  - 3- x daily - 7 x weekly - 1 sets - 5-10 reps - 5-10 hold - Heel Raise on Step  - 3 x daily - 7 x weekly - 1 sets - 10 reps - Standing Gastroc Stretch on Step  - 3 x daily - 7 x weekly - 1 sets - 10 reps - Forward Step Touch  - 3 x daily - 7 x weekly - 1 sets - 10 reps - Seated Knee Flexion Slide  - 3 x daily - 7 x weekly - 1 sets - 10 reps - Sit to Stand  - 3 x daily - 7 x weekly - 1 sets - 10  reps  ASSESSMENT:  CLINICAL IMPRESSION: pt arrived with foot finally feeling better and she is walking much better.  Good MD report on Wednesday, per pt surgeon very pleased. Pt states she would like to do fewer reps because she is just wiped out on therapy days. OBJECTIVE IMPAIRMENTS: Abnormal gait, cardiopulmonary status limiting activity, decreased activity tolerance, decreased balance, decreased endurance, decreased mobility, difficulty walking, decreased ROM, decreased strength, increased edema, increased muscle spasms, impaired flexibility, impaired vision/preception, improper body mechanics, postural dysfunction, and pain.   REHAB POTENTIAL: Good  CLINICAL DECISION MAKING: Evolving/moderate complexity  EVALUATION COMPLEXITY: Moderate   GOALS: Goals reviewed with patient? Yes  SHORT TERM GOALS: Target date: 04/20/24 Independent with initial HEP Baseline: Goal status: 04/01/24 MET  LONG TERM GOALS: Target date: 06/29/24  Independent with advanced HEP Baseline:  Goal status: progressing10/7/25  2.  Independent with RICE Baseline:  Goal status: MET 04/14/24  3.  Decrease pain 50% for better function and quality of life Baseline:  Goal status: progressing 04/14/24, not 50% yet progressing 04/29/24 05/06/24 progressing  4.  Decrease TUG to 12 seconds without device Baseline:  Goal status: 04/22/24 MET, 11 seconds without device 05/03/24  5.  Increase AROM of the left knee to 4-116 degrees flexion Baseline:  Goal status: 04/14/24 2-100 progressing  and 04/22/24, 0-110d 04/29/24 progressing  6.  Go up and down steps step over step Baseline:  Goal status: 04/14/24 progressing, progressing 04/29/24 and 05/06/24   PLAN:  PT FREQUENCY: 2x/week  PT DURATION: 12 weeks  PLANNED INTERVENTIONS: 97164- PT Re-evaluation, 97110-Therapeutic exercises, 97530- Therapeutic activity, 97112- Neuromuscular re-education, 97535- Self Care, [id number]- Manual therapy, [phone]- Gait training, [phone]-  Electrical stimulation (unattended), 97016- Vasopneumatic device, Patient/Family education, Balance training, Stair training, Taping, Joint mobilization, and Cryotherapy  PLAN FOR NEXT SESSION: continue to progress ROM and strength   Stacy Deshler,ANGIE, PTA 05/06/2024, 10:14 AM

## 2024-05-10 ENCOUNTER — Ambulatory Visit: Admitting: Physical Therapy

## 2024-05-10 DIAGNOSIS — M25662 Stiffness of left knee, not elsewhere classified: Secondary | ICD-10-CM

## 2024-05-10 DIAGNOSIS — M25562 Pain in left knee: Secondary | ICD-10-CM

## 2024-05-10 DIAGNOSIS — R262 Difficulty in walking, not elsewhere classified: Secondary | ICD-10-CM

## 2024-05-10 NOTE — Therapy (Signed)
 OUTPATIENT PHYSICAL THERAPY LOWER EXTREMITY     Patient Name: Debbie Hayes MRN: [id number] DOB:06-05-1951, 73 y.o., female Today's Date: 05/10/2024  END OF SESSION:  PT End of Session - 05/10/24 1444     Visit Number 13    Date for Recertification  06/29/24    Authorization Type [employer] medicare    PT Start Time 1445    PT Stop Time 1530    PT Time Calculation (min) 45 min            Past Medical History:  Diagnosis Date   Allergy    Asthma    Cancer (HCC)    thyroid  cancer   Cataract    Chronic migraine BOTOX INJECTION EVERY 3 MONTHS   Chronic pain in right shoulder    Constipation    Coronary artery disease    Diabetes mellitus    Disorder of inner ear CAUSES VERTIGO OCCASIONALLY   Fibromyalgia    GERD (gastroesophageal reflux disease)    Hemorrhoid    History of bladder infections    History of thyroid  cancer 2009  S/P TOTAL THYROIDECTOMY AND RADIATION   Hyperlipidemia    Hypertension CARDIOLOGIST- DR BLANCA- WILL REQUEST LATE NOTE   DENIES S & S   Hypothyroidism    Insomnia    Left shoulder pain    Macular degeneration    MRSA infection    nasal treated more than 15 years ago   Normal nuclear stress test 01/25/2009   OA (osteoarthritis) JOINTS   Osteopenia    Osteoporosis    Painful orthopaedic hardware    left foot   Pneumonia    PONV (postoperative nausea and vomiting) SEVERE   Rotator cuff disorder LEFT SHOULDER RTC IMPINGMENT   Sleep apnea    mouth guard, no cpap    Sleep apnea in adult 06/26/2017   Spondylolisthesis of lumbar region    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   Wears glasses    Past Surgical History:  Procedure Laterality Date   BACK SURGERY     Dr Mavis- spondylosis   BILATERAL CARPAL TUNNEL RELEASE  1994   BILATERAL ELBOW SURG.  1999   BILATERAL SALPINGOOPHECTOMY  1993   POST-OP URETER REPAIR 12 DAYS AFTER    CATARACT EXTRACTION, BILATERAL     COLONOSCOPY     EYE SURGERY     FOOT ARTHRODESIS Left 07/10/2016    Procedure: LEFT 2ND TARSAL METATARSAL ARTHRODESIS  GASTROC RECESSION LEFT LAPIDUS MODIFIED MCBRIDE BUIONECTOMY;  Surgeon: Norleen Armor, MD;  Location: [facility];  Service: Orthopedics;  Laterality: Left;   GASTROC RECESSION EXTREMITY Left 07/10/2016   Procedure: GASTROC RECESSION EXTREMITY;  Surgeon: Norleen Armor, MD;  Location: [facility];  Service: Orthopedics;  Laterality: Left;   HARDWARE REMOVAL Left 05/20/2018   Procedure: Left foot removal of deep implants;  Surgeon: Armor Norleen, MD;  Location: [facility];  Service: Orthopedics;  Laterality: Left;    LEFT THUMB JOINT REPLACEMENT  2005   RIGHT DONE IN 2004   OOPHORECTOMY     POLYPECTOMY     RIGHT KNEE ARTHROSCOPY  X2  BEFORE 2011   RIGHT KNEE ARTHROSCOPY/ PARTIAL LATERAL MENISECTOMY/ TRICOMPARTMENT CHONDROPLASTY/ DECOMPRESSION CYST  10/26/2009   RIGHT KNEE CLOSED MANIPULATION  09/11/2010   RIGHT SHOULDER ARTHROSCOPY  2010  &  2004   TIBIA CYST REMOVED AND ORIF LEG FX  1958   TONSILLECTOMY  1968   TOTAL KNEE ARTHROPLASTY  07/16/2010  RIGHT   TOTAL KNEE ARTHROPLASTY Left 03/22/2024   Procedure: ARTHROPLASTY, KNEE, TOTAL;  Surgeon: Josefina Chew, MD;  Location: [facility];  Service: Orthopedics;  Laterality: Left;   TOTAL THYROIDECTOMY  2009   CANCER  (POST-OP BLEED)  AND RADIATION TX   trigger finger repair      07/2023   trigger fingers Right 2013   3rd and 4th fingers   UPPER GASTROINTESTINAL ENDOSCOPY     VAGINAL HYSTERECTOMY  1990   Patient Active Problem List   Diagnosis Date Noted   Pain of right thumb 02/26/2024   Precordial pain 12/23/2023   Primary osteoarthritis of left knee with subchondral insufficiency fractures 09/23/2023   History of arthroplasty of right knee 07/03/2023   Primary osteoarthritis of right shoulder 07/03/2023   Coronary artery disease involving native coronary artery of native heart without angina pectoris 05/25/2023   Agatston coronary artery  calcium  score greater than 400 11/06/2022   Statin myopathy 11/06/2022   Frequent UTI 10/29/2022   Wart of hand 09/30/2022   Acute cystitis without hematuria 05/14/2021   Medial epicondylitis, right 03/25/2021   Trigger thumb, right thumb 11/19/2020   Right wrist pain 10/23/2020   Osteoporosis 04/05/2020   Trigger finger, left middle finger 07/27/2019   Chronic neck pain 11/10/2018   Orthopedic hardware present 02/17/2018   Controlled diabetes mellitus type 2 with complications (HCC) 12/15/2017   Intractable chronic migraine without aura and with status migrainosus 12/15/2017   Arthralgia 12/15/2017   Joint pain 12/15/2017   Migraine, transformed 12/15/2017   Chronic migraine without aura, with intractable migraine, so stated, with status migrainosus 12/15/2017   Pain of left shoulder joint on movement 10/19/2017   OSA (obstructive sleep apnea) 06/26/2017   Snoring 03/25/2016   Palpitations 03/25/2016   Headache 03/25/2016   Sensory disorder of trigeminal nerve 11/26/2015   Spondylolisthesis of lumbar region 08/09/2014   Lumbar adjacent segment disease with spondylolisthesis 08/09/2014   Back pain 02/17/2014   Macular degeneration 02/17/2014   Backache 02/17/2014   Other and unspecified hyperlipidemia 10/14/2013   Hyperlipidemia 10/14/2013   Onychomycosis 12/01/2012   Arthritis 04/12/2012   Hypothyroidism 04/12/2012    PCP: Watt, MD  REFERRING PROVIDER: Josefina, MD  REFERRING DIAG: s/p Left TKA  THERAPY DIAG:  No diagnosis found.  Rationale for Evaluation and Treatment: Rehabilitation  ONSET DATE: 03/22/24  SUBJECTIVE:   SUBJECTIVE STATEMENT:    gabapentin  I had to stop d/t aggravating my stomach. Some nerve pain today so lets go easy    PAIN:  Are you having pain? Left knee 2-3/10 PRECAUTIONS: None  RED FLAGS: None   WEIGHT BEARING RESTRICTIONS: No  FALLS:  Has patient fallen in last 6 months? No  LIVING ENVIRONMENT: Lives with: lives with their  family Lives in: House/apartment Stairs: Yes: Internal: 2 steps; can reach both Has following equipment at home: Single point cane, Walker - 2 wheeled, Crutches, and shower chair  OCCUPATION: retired  PLOF: Independent and shopping and housework  PATIENT GOALS: have normal walk without device, less pain  NEXT MD VISIT: 04/06/24  OBJECTIVE:  Note: Objective measures were completed at Evaluation unless otherwise noted.   COGNITION: Overall cognitive status: Within functional limits for tasks assessed     SENSATION: WFL  EDEMA:  Circumferential: mid patella 47 cm on the left, 40 cm on the right  MUSCLE LENGTH: Tight calves and HS  POSTURE: rounded shoulders and forward head  PALPATION: Tender, ecchymosis left thigh and knee, inner and posterior  LOWER EXTREMITY ROM:  Active ROM AROM LEft eval PROM Left eval LLE 04/01/24 AROM  LLE 04/04/24 04/26/24 04/29/24 05/03/24 AROM/PROM  Hip flexion         Hip extension         Hip abduction         Hip adduction         Hip internal rotation         Hip external rotation         Knee flexion 65 70 82 85 112 110 112/117  Knee extension 15 9 5 5  0 0 4/0  Ankle dorsiflexion         Ankle plantarflexion         Ankle inversion         Ankle eversion          (Blank rows = not tested)  LOWER EXTREMITY MMT:  MMT Right eval Left eval  Hip flexion    Hip extension    Hip abduction    Hip adduction    Hip internal rotation    Hip external rotation    Knee flexion  2  Knee extension  2  Ankle dorsiflexion    Ankle plantarflexion    Ankle inversion    Ankle eversion     (Blank rows = not tested)  LOWER EXTREMITY SPECIAL TESTS:  Knee special tests: quad lag, ballotable patella  FUNCTIONAL TESTS:  Timed up and go (TUG): 17 seconds  GAIT: Distance walked: 60 feet Assistive device utilized: Walker - 2 wheeled Level of assistance: Modified independence Comments: mild antalgic gait                                                                                                                                 TREATMENT DATE:   05/10/24 ACT flexion  118 PROM and jt mobs flex and ext Nustep L 4 5 min Bike 5 min full rev 3# LAQ 2 sets 10 3# standing march,HS curl, SL 3 way 10x 6 in step up 10 x Red tband HS curl 2 sets 10 Leg press 30# 2 sets 10  05/06/24 PROM flex and ext HS curl 20# 10 x Knee ext 5# 10 x Bike 5 min full revolutions Nustep L 4 Left foot on dyna disc motions  LAQ 3# 10 x Standing 3# marching, hip abd, ext and HS curl 10 x each    05/03/24 Nustep level 4 x 6 minutes Bike seat 11 x 4 minutes full revolutions HS curls 20# Leg ext 5# Passive stretch Left LE Joint mobs Scar mobs Left foot on dyna disc motions  AROM 4-112 degrees PROM 0-117 degrees TUG 11 seconds without device  04/29/24 Recheck goals for 10th visit  NuStep L5 x23mins LE only  Bike seat 11 x54mins for ROM Leg ext 5# 2x10 HS curls 20# 2x10 STS 2x10 Leg press 20# 2x10 Step ups 4   04/26/24  PROM, stretching and STW Left knee and ankle/foot Nustep L 5 Func stepping multi directional for ROM Looked at gait but pt struggling d/t pain in foot and great toe AROM 0-112   04/22/24 Nustep L 5 LE only HS curl 20# 2 sets 10. 10# 10 x LLE Knee ext 5# 2 sets 10 PROM flex and ext STW to knee ,scr and quad 3# LAQ with focus on TKE 2 sets 10 Green tband HS curl 2 sets 10 Act flexion end of session 110  04/19/24 Nustep L 5 LE only Green tband HS curl 2 sets 10 3# LAQ 2 sets 10 3# standing HS curl 2 sets 10 3# 8 in step tap 2 sets 10 6 inch step up fwd and laterally 10 x each Green tband TKE 2 sets 10 PROM flex and ext AROM after 0-103 Leg Press 30# 2 sets 10 Black bar DF and PF 15 x STS wt ball 10 x Resisted gait fwd,back and side ways 20#   04/14/24 Nustep L 5 LE only LAQ 2# 2 sets 10 HS curls red tband 2 sets 10 ( decreased to red to increase ROM) SAQ 10 x TKE with  manual resistance 10 x 6 inch step up fwd and laterally 10 x each PROM flex and ext AROM sitting EOB  2-100 Resisted squats 2 sets 10 green tband Leg press 20 # 2 sets 10    04/11/24 NuStep L5x51mins  LAQ 2# 2x10 HS curls green 2x10 Gentle PROM flexion/ext, patellar mobs AROM 95d STS 2x10 Step ups 6  HEP review  04/07/24 Nustep L 4 LAQ 2 sets 10 HS curl 2 sets 10 Leg press 20# 2 sets 10 Step stretch, step tap and calf stretch on step -HEP review and answered questions. STM to the quad and ITB area Gentle PROM into flexion and ext- tolerated fair if gentle    04/04/24 Bike partial reps x 5 minutes Gait without device x 200 feet LAQ 2x10 SAQ 2x10 STM and scar mobilization to the left knee  Feet on ball K2C, rotation, small bridge and isometric abs Ball b/n knees squeeze STM to the quad and ITB area Gentle PROM into flexion she does not tolerate this well Gait without device 200 feet SBA  04/01/24 Nustep L 4 PROM flex and ext Pat mobs Gentle STW to LEft thigh LAQ 2 sets 10 Yellow tband HS curl 2 sets 10 Step stretch Step up Calf stretch on step VASO low flat 6 min 03/30/24 Evaluation  Nustep level 3 x 5 minutes Vaso low pressure 34 degrees with elevation   PATIENT EDUCATION:  Education details: POC/HEP Person educated: Patient Education method: Explanation, Facilities manager, and Handouts Education comprehension: verbalized understanding  HOME EXERCISE PROGRAM: Access Code: [email or web address] URL: [email or web address] Date: 04/11/2024 Prepared by: Almetta Fam  Exercises - Standing Knee Flexion Stretch on Step  - 3- x daily - 7 x weekly - 1 sets - 5-10 reps - 5-10 hold - Heel Raise on Step  - 3 x daily - 7 x weekly - 1 sets - 10 reps - Standing Gastroc Stretch on Step  - 3 x daily - 7 x weekly - 1 sets - 10 reps - Forward Step Touch  - 3 x daily - 7 x weekly - 1 sets - 10 reps - Seated Knee Flexion Slide  - 3 x daily - 7 x weekly - 1 sets -  10 reps - Sit to Stand  - 3 x  daily - 7 x weekly - 1 sets - 10 reps  ASSESSMENT:  CLINICAL IMPRESSION: pt arrived with issues from gabapentin .requested lift session. Progressed ex slow at pt request . Excellent flexion 118   OBJECTIVE IMPAIRMENTS: Abnormal gait, cardiopulmonary status limiting activity, decreased activity tolerance, decreased balance, decreased endurance, decreased mobility, difficulty walking, decreased ROM, decreased strength, increased edema, increased muscle spasms, impaired flexibility, impaired vision/preception, improper body mechanics, postural dysfunction, and pain.   REHAB POTENTIAL: Good  CLINICAL DECISION MAKING: Evolving/moderate complexity  EVALUATION COMPLEXITY: Moderate   GOALS: Goals reviewed with patient? Yes  SHORT TERM GOALS: Target date: 04/20/24 Independent with initial HEP Baseline: Goal status: 04/01/24 MET  LONG TERM GOALS: Target date: 06/29/24  Independent with advanced HEP Baseline:  Goal status: progressing10/7/25  2.  Independent with RICE Baseline:  Goal status: MET 04/14/24  3.  Decrease pain 50% for better function and quality of life Baseline:  Goal status: progressing 04/14/24, not 50% yet progressing 04/29/24 05/06/24 progressing  4.  Decrease TUG to 12 seconds without device Baseline:  Goal status: 04/22/24 MET, 11 seconds without device 05/03/24  5.  Increase AROM of the left knee to 4-116 degrees flexion Baseline:  Goal status: 04/14/24 2-100 progressing  and 04/22/24, 0-110d 04/29/24 progressing  6.  Go up and down steps step over step Baseline:  Goal status: 04/14/24 progressing, progressing 04/29/24 and 05/06/24   PLAN:  PT FREQUENCY: 2x/week  PT DURATION: 12 weeks  PLANNED INTERVENTIONS: 97164- PT Re-evaluation, 97110-Therapeutic exercises, 97530- Therapeutic activity, 97112- Neuromuscular re-education, 97535- Self Care, [id number]- Manual therapy, [phone]- Gait training, [phone]- Electrical stimulation (unattended),  97016- Vasopneumatic device, Patient/Family education, Balance training, Stair training, Taping, Joint mobilization, and Cryotherapy  PLAN FOR NEXT SESSION: continue to progress ROM and strength   Edris Friedt,ANGIE, PTA 05/10/2024, 2:45 PM

## 2024-05-12 ENCOUNTER — Ambulatory Visit: Attending: Cardiology | Admitting: Cardiology

## 2024-05-12 ENCOUNTER — Encounter: Payer: Self-pay | Admitting: Cardiology

## 2024-05-12 VITALS — BP 116/70 | HR 94 | Ht 68.0 in | Wt 155.8 lb

## 2024-05-12 DIAGNOSIS — I10 Essential (primary) hypertension: Secondary | ICD-10-CM

## 2024-05-12 DIAGNOSIS — I2510 Atherosclerotic heart disease of native coronary artery without angina pectoris: Secondary | ICD-10-CM

## 2024-05-12 NOTE — Progress Notes (Signed)
  Cardiology Office Note:  .   Date:  05/12/2024  ID:  Debbie Hayes, DOB 11/26/1950, MRN [id number] PCP: Watt Harlene BROCKS, MD  [facility] Providers Cardiologist:  Newman Lawrence, MD PCP: Watt Harlene BROCKS, MD  Chief Complaint  Patient presents with   Coronary calcification     Debbie Hayes is a 73 y.o. female with type 2 DM, controlled hyperlipidemia, elevated CAC   History of Present Illness  Patient is doing well from cardiac standpoint, denies any recent chest pain or shortness of breath.  She has had couple setbacks in the last few months due to orthopedic issues including knee pain.  She is compliant with her medical therapy.    Vitals:   05/12/24 1532  BP: 116/70  Pulse: 94  SpO2: 96%       Review of Systems  Cardiovascular:  Negative for chest pain, dyspnea on exertion, leg swelling, palpitations and syncope.        Studies Reviewed: Debbie Hayes        EKG 12/23/2023: Sinus rhythm 72 bpm Incomplete right bundle branch block  Labs 11/2023: Chol 163, TG 194, HDL 62, LDL 61 HbA1C 7.2% Hb 15.1 Cr 0.7  Physical Exam Vitals and nursing note reviewed.  Constitutional:      General: She is not in acute distress. Neck:     Vascular: No JVD.  Cardiovascular:     Rate and Rhythm: Normal rate and regular rhythm.     Heart sounds: Normal heart sounds. No murmur heard. Pulmonary:     Effort: Pulmonary effort is normal.     Breath sounds: Normal breath sounds. No wheezing or rales.  Musculoskeletal:     Right lower leg: No edema.     Left lower leg: No edema.      VISIT DIAGNOSES:   ICD-10-CM   1. Coronary artery calcification  I25.10     2. Primary hypertension  I10         Debbie Hayes is a 73 y.o. female with type 2 DM, controlled hyperlipidemia, elevated CAC  Assessment & Plan  Elevated CAC: 92nd percentile, including calcium  237 in left main. No ischemia on stress testing (10/2022). Known statin  myopathy. Continue Repatha . Continue Plavix  75 mg daily (Aspirin  intolerance).   Hypertension: Controlled.    Continue follow-up with PCP Dr. Watt.  I will see her as needed.  Signed, Newman JINNY Lawrence, MD

## 2024-05-12 NOTE — Patient Instructions (Signed)
Medication Instructions:  No changes *If you need a refill on your cardiac medications before your next appointment, please call your pharmacy*   Lab Work: none If you have labs (blood work) drawn today and your tests are completely normal, you will receive your results only by: MyChart Message (if you have MyChart) OR A paper copy in the mail If you have any lab test that is abnormal or we need to change your treatment, we will call you to review the results.   Testing/Procedures: none   Follow-Up: As needed 

## 2024-05-13 ENCOUNTER — Ambulatory Visit: Admitting: Physical Therapy

## 2024-05-13 DIAGNOSIS — M25562 Pain in left knee: Secondary | ICD-10-CM | POA: Diagnosis not present

## 2024-05-13 DIAGNOSIS — R262 Difficulty in walking, not elsewhere classified: Secondary | ICD-10-CM

## 2024-05-13 DIAGNOSIS — M25662 Stiffness of left knee, not elsewhere classified: Secondary | ICD-10-CM

## 2024-05-13 NOTE — Therapy (Signed)
 OUTPATIENT PHYSICAL THERAPY LOWER EXTREMITY     Patient Name: KINSLEIGH LUDOLPH MRN: [id number] DOB:07/05/1951, 73 y.o., female Today's Date: 05/13/2024  END OF SESSION:  PT End of Session - 05/13/24 1059     Visit Number 14    Date for Recertification  06/29/24    Authorization Type [employer] medicare    PT Start Time 1100    PT Stop Time 1135    PT Time Calculation (min) 35 min            Past Medical History:  Diagnosis Date   Allergy    Asthma    Cancer (HCC)    thyroid  cancer   Cataract    Chronic migraine BOTOX INJECTION EVERY 3 MONTHS   Chronic pain in right shoulder    Constipation    Coronary artery disease    Diabetes mellitus    Disorder of inner ear CAUSES VERTIGO OCCASIONALLY   Fibromyalgia    GERD (gastroesophageal reflux disease)    Hemorrhoid    History of bladder infections    History of thyroid  cancer 2009  S/P TOTAL THYROIDECTOMY AND RADIATION   Hyperlipidemia    Hypertension CARDIOLOGIST- DR BLANCA- WILL REQUEST LATE NOTE   DENIES S & S   Hypothyroidism    Insomnia    Left shoulder pain    Macular degeneration    MRSA infection    nasal treated more than 15 years ago   Normal nuclear stress test 01/25/2009   OA (osteoarthritis) JOINTS   Osteopenia    Osteoporosis    Painful orthopaedic hardware    left foot   Pneumonia    PONV (postoperative nausea and vomiting) SEVERE   Rotator cuff disorder LEFT SHOULDER RTC IMPINGMENT   Sleep apnea    mouth guard, no cpap    Sleep apnea in adult 06/26/2017   Spondylolisthesis of lumbar region    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   Wears glasses    Past Surgical History:  Procedure Laterality Date   BACK SURGERY     Dr Mavis- spondylosis   BILATERAL CARPAL TUNNEL RELEASE  1994   BILATERAL ELBOW SURG.  1999   BILATERAL SALPINGOOPHECTOMY  1993   POST-OP URETER REPAIR 12 DAYS AFTER    CATARACT EXTRACTION, BILATERAL     COLONOSCOPY     EYE SURGERY     FOOT ARTHRODESIS Left 07/10/2016    Procedure: LEFT 2ND TARSAL METATARSAL ARTHRODESIS  GASTROC RECESSION LEFT [employer] MODIFIED MCBRIDE BUIONECTOMY;  Surgeon: Norleen Armor, MD;  Location: [facility];  Service: Orthopedics;  Laterality: Left;   GASTROC RECESSION EXTREMITY Left 07/10/2016   Procedure: GASTROC RECESSION EXTREMITY;  Surgeon: Norleen Armor, MD;  Location: [facility];  Service: Orthopedics;  Laterality: Left;   HARDWARE REMOVAL Left 05/20/2018   Procedure: Left foot removal of deep implants;  Surgeon: Armor Norleen, MD;  Location: [facility];  Service: Orthopedics;  Laterality: Left;    LEFT THUMB JOINT REPLACEMENT  2005   RIGHT DONE IN 2004   OOPHORECTOMY     POLYPECTOMY     RIGHT KNEE ARTHROSCOPY  X2  BEFORE 2011   RIGHT KNEE ARTHROSCOPY/ PARTIAL LATERAL MENISECTOMY/ TRICOMPARTMENT CHONDROPLASTY/ DECOMPRESSION CYST  10/26/2009   RIGHT KNEE CLOSED MANIPULATION  09/11/2010   RIGHT SHOULDER ARTHROSCOPY  2010  &  2004   TIBIA CYST REMOVED AND ORIF LEG FX  1958   TONSILLECTOMY  1968   TOTAL KNEE ARTHROPLASTY  07/16/2010  RIGHT   TOTAL KNEE ARTHROPLASTY Left 03/22/2024   Procedure: ARTHROPLASTY, KNEE, TOTAL;  Surgeon: Josefina Chew, MD;  Location: [facility];  Service: Orthopedics;  Laterality: Left;   TOTAL THYROIDECTOMY  2009   CANCER  (POST-OP BLEED)  AND RADIATION TX   trigger finger repair      07/2023   trigger fingers Right 2013   3rd and 4th fingers   UPPER GASTROINTESTINAL ENDOSCOPY     VAGINAL HYSTERECTOMY  1990   Patient Active Problem List   Diagnosis Date Noted   Primary hypertension 05/12/2024   Pain of right thumb 02/26/2024   Precordial pain 12/23/2023   Primary osteoarthritis of left knee with subchondral insufficiency fractures 09/23/2023   History of arthroplasty of right knee 07/03/2023   Primary osteoarthritis of right shoulder 07/03/2023   Coronary artery disease involving native coronary artery of native heart without angina pectoris  05/25/2023   Coronary artery calcification 11/06/2022   Statin myopathy 11/06/2022   Frequent UTI 10/29/2022   Wart of hand 09/30/2022   Acute cystitis without hematuria 05/14/2021   Medial epicondylitis, right 03/25/2021   Trigger thumb, right thumb 11/19/2020   Right wrist pain 10/23/2020   Osteoporosis 04/05/2020   Trigger finger, left middle finger 07/27/2019   Chronic neck pain 11/10/2018   Orthopedic hardware present 02/17/2018   Controlled diabetes mellitus type 2 with complications (HCC) 12/15/2017   Intractable chronic migraine without aura and with status migrainosus 12/15/2017   Arthralgia 12/15/2017   Joint pain 12/15/2017   Migraine, transformed 12/15/2017   Chronic migraine without aura, with intractable migraine, so stated, with status migrainosus 12/15/2017   Pain of left shoulder joint on movement 10/19/2017   OSA (obstructive sleep apnea) 06/26/2017   Snoring 03/25/2016   Palpitations 03/25/2016   Headache 03/25/2016   Sensory disorder of trigeminal nerve 11/26/2015   Spondylolisthesis of lumbar region 08/09/2014   Lumbar adjacent segment disease with spondylolisthesis 08/09/2014   Back pain 02/17/2014   Macular degeneration 02/17/2014   Backache 02/17/2014   Other and unspecified hyperlipidemia 10/14/2013   Hyperlipidemia 10/14/2013   Onychomycosis 12/01/2012   Arthritis 04/12/2012   Hypothyroidism 04/12/2012    PCP: Watt, MD  REFERRING PROVIDER: Josefina, MD  REFERRING DIAG: s/p Left TKA  THERAPY DIAG:  Acute pain of left knee  Stiffness of left knee, not elsewhere classified  Difficulty in walking, not elsewhere classified  Rationale for Evaluation and Treatment: Rehabilitation  ONSET DATE: 03/22/24  SUBJECTIVE:   SUBJECTIVE STATEMENT:    foot is better. Knee is rough but doing more    PAIN:  Are you having pain? Left knee 7/10 PRECAUTIONS: None  RED FLAGS: None   WEIGHT BEARING RESTRICTIONS: No  FALLS:  Has patient fallen in  last 6 months? No  LIVING ENVIRONMENT: Lives with: lives with their family Lives in: House/apartment Stairs: Yes: Internal: 2 steps; can reach both Has following equipment at home: Single point cane, Walker - 2 wheeled, Crutches, and shower chair  OCCUPATION: retired  PLOF: Independent and shopping and housework  PATIENT GOALS: have normal walk without device, less pain  NEXT MD VISIT: 04/06/24  OBJECTIVE:  Note: Objective measures were completed at Evaluation unless otherwise noted.   COGNITION: Overall cognitive status: Within functional limits for tasks assessed     SENSATION: WFL  EDEMA:  Circumferential: mid patella 47 cm on the left, 40 cm on the right  MUSCLE LENGTH: Tight calves and HS  POSTURE: rounded shoulders and forward head  PALPATION: Tender,  ecchymosis left thigh and knee, inner and posterior  LOWER EXTREMITY ROM:  Active ROM AROM LEft eval PROM Left eval LLE 04/01/24 AROM  LLE 04/04/24 04/26/24 04/29/24 05/03/24 AROM/PROM  Hip flexion         Hip extension         Hip abduction         Hip adduction         Hip internal rotation         Hip external rotation         Knee flexion 65 70 82 85 112 110 112/117  Knee extension 15 9 5 5  0 0 4/0  Ankle dorsiflexion         Ankle plantarflexion         Ankle inversion         Ankle eversion          (Blank rows = not tested)  LOWER EXTREMITY MMT:  MMT Right eval Left eval  Hip flexion    Hip extension    Hip abduction    Hip adduction    Hip internal rotation    Hip external rotation    Knee flexion  2  Knee extension  2  Ankle dorsiflexion    Ankle plantarflexion    Ankle inversion    Ankle eversion     (Blank rows = not tested)  LOWER EXTREMITY SPECIAL TESTS:  Knee special tests: quad lag, ballotable patella  FUNCTIONAL TESTS:  Timed up and go (TUG): 17 seconds  GAIT: Distance walked: 60 feet Assistive device utilized: Environmental consultant - 2 wheeled Level of assistance: Modified  independence Comments: mild antalgic gait                                                                                                                                TREATMENT DATE:   05/13/24 STW to left knee, quad and incision with joint mobs PROM flex and ext with end range stretch AROM sitting after 0-120 Bike 6 min seat 12 L 4 Nustep L 5 6 min LE only Leg Press 30# 2 sets 10- seat 7, 6 too hard 3# LAQ 2 sets 10 Green tband HS curl 2 sets 10   05/10/24 ACT flexion  118 PROM and jt mobs flex and ext Nustep L 4 5 min Bike 5 min full rev 3# LAQ 2 sets 10 3# standing march,HS curl, SL 3 way 10x 6 in step up 10 x Red tband HS curl 2 sets 10 Leg press 30# 2 sets 10  05/06/24 PROM flex and ext HS curl 20# 10 x Knee ext 5# 10 x Bike 5 min full revolutions Nustep L 4 Left foot on dyna disc motions  LAQ 3# 10 x Standing 3# marching, hip abd, ext and HS curl 10 x each    05/03/24 Nustep level 4 x 6 minutes Bike seat 11 x 4 minutes full revolutions HS  curls 20# Leg ext 5# Passive stretch Left LE Joint mobs Scar mobs Left foot on dyna disc motions  AROM 4-112 degrees PROM 0-117 degrees TUG 11 seconds without device  04/29/24 Recheck goals for 10th visit  NuStep L5 x14mins LE only  Bike seat 11 x70mins for ROM Leg ext 5# 2x10 HS curls 20# 2x10 STS 2x10 Leg press 20# 2x10 Step ups 4   04/26/24 PROM, stretching and STW Left knee and ankle/foot Nustep L 5 Func stepping multi directional for ROM Looked at gait but pt struggling d/t pain in foot and great toe AROM 0-112   04/22/24 Nustep L 5 LE only HS curl 20# 2 sets 10. 10# 10 x LLE Knee ext 5# 2 sets 10 PROM flex and ext STW to knee ,scr and quad 3# LAQ with focus on TKE 2 sets 10 Green tband HS curl 2 sets 10 Act flexion end of session 110  04/19/24 Nustep L 5 LE only Green tband HS curl 2 sets 10 3# LAQ 2 sets 10 3# standing HS curl 2 sets 10 3# 8 in step tap 2 sets 10 6  inch step up fwd and laterally 10 x each Green tband TKE 2 sets 10 PROM flex and ext AROM after 0-103 Leg Press 30# 2 sets 10 Black bar DF and PF 15 x STS wt ball 10 x Resisted gait fwd,back and side ways 20#   04/14/24 Nustep L 5 LE only LAQ 2# 2 sets 10 HS curls red tband 2 sets 10 ( decreased to red to increase ROM) SAQ 10 x TKE with manual resistance 10 x 6 inch step up fwd and laterally 10 x each PROM flex and ext AROM sitting EOB  2-100 Resisted squats 2 sets 10 green tband Leg press 20 # 2 sets 10    04/11/24 NuStep L5x44mins  LAQ 2# 2x10 HS curls green 2x10 Gentle PROM flexion/ext, patellar mobs AROM 95d STS 2x10 Step ups 6  HEP review  04/07/24 Nustep L 4 LAQ 2 sets 10 HS curl 2 sets 10 Leg press 20# 2 sets 10 Step stretch, step tap and calf stretch on step -HEP review and answered questions. STM to the quad and ITB area Gentle PROM into flexion and ext- tolerated fair if gentle    04/04/24 Bike partial reps x 5 minutes Gait without device x 200 feet LAQ 2x10 SAQ 2x10 STM and scar mobilization to the left knee  Feet on ball K2C, rotation, small bridge and isometric abs Ball b/n knees squeeze STM to the quad and ITB area Gentle PROM into flexion she does not tolerate this well Gait without device 200 feet SBA  04/01/24 Nustep L 4 PROM flex and ext Pat mobs Gentle STW to LEft thigh LAQ 2 sets 10 Yellow tband HS curl 2 sets 10 Step stretch Step up Calf stretch on step VASO low flat 6 min 03/30/24 Evaluation  Nustep level 3 x 5 minutes Vaso low pressure 34 degrees with elevation   PATIENT EDUCATION:  Education details: POC/HEP Person educated: Patient Education method: Explanation, Facilities manager, and Handouts Education comprehension: verbalized understanding  HOME EXERCISE PROGRAM: Access Code: [email or web address] URL: [email or web address] Date: 04/11/2024 Prepared by: Almetta Fam  Exercises - Standing Knee Flexion  Stretch on Step  - 3- x daily - 7 x weekly - 1 sets - 5-10 reps - 5-10 hold - Heel Raise on Step  - 3 x daily - 7 x weekly - 1 sets -  10 reps - Standing Gastroc Stretch on Step  - 3 x daily - 7 x weekly - 1 sets - 10 reps - Forward Step Touch  - 3 x daily - 7 x weekly - 1 sets - 10 reps - Seated Knee Flexion Slide  - 3 x daily - 7 x weekly - 1 sets - 10 reps - Sit to Stand  - 3 x daily - 7 x weekly - 1 sets - 10 reps  ASSESSMENT:  CLINICAL IMPRESSION: PT arrived with foot pain resolved. Left knee 7/10. Overall reports 40-50% better overall and doing more normal activities. Progressing but slowly as pt does flare up easily ( fibromyalgia)so slow progression is best.  Goals assessed and documented.  OBJECTIVE IMPAIRMENTS: Abnormal gait, cardiopulmonary status limiting activity, decreased activity tolerance, decreased balance, decreased endurance, decreased mobility, difficulty walking, decreased ROM, decreased strength, increased edema, increased muscle spasms, impaired flexibility, impaired vision/preception, improper body mechanics, postural dysfunction, and pain.   REHAB POTENTIAL: Good  CLINICAL DECISION MAKING: Evolving/moderate complexity  EVALUATION COMPLEXITY: Moderate   GOALS: Goals reviewed with patient? Yes  SHORT TERM GOALS: Target date: 04/20/24 Independent with initial HEP Baseline: Goal status: 04/01/24 MET  LONG TERM GOALS: Target date: 06/29/24  Independent with advanced HEP Baseline:  Goal status: progressing10/7/25  and 05/13/24  2.  Independent with RICE Baseline:  Goal status: MET 04/14/24  3.  Decrease pain 50% for better function and quality of life Baseline:  Goal status: progressing 04/14/24, not 50% yet progressing 04/29/24 05/06/24 progressing  05/13/24 40-50% PROGRESSING  4.  Decrease TUG to 12 seconds without device Baseline:  Goal status: 04/22/24 MET, 11 seconds without device 05/03/24  5.  Increase AROM of the left knee to 4-116 degrees  flexion Baseline:  Goal status: 04/14/24 2-100 progressing  and 04/22/24, 0-110d 04/29/24 progressing 05/13/24 met  6.  Go up and down steps step over step Baseline:  Goal status: 04/14/24 progressing, progressing 04/29/24 and 05/06/24   PLAN:  PT FREQUENCY: 2x/week  PT DURATION: 12 weeks  PLANNED INTERVENTIONS: 97164- PT Re-evaluation, 97110-Therapeutic exercises, 97530- Therapeutic activity, 97112- Neuromuscular re-education, 97535- Self Care, [id number]- Manual therapy, [phone]- Gait training, [phone]- Electrical stimulation (unattended), 97016- Vasopneumatic device, Patient/Family education, Balance training, Stair training, Taping, Joint mobilization, and Cryotherapy  PLAN FOR NEXT SESSION: continue to progress ROM and strength   Hideko Esselman,ANGIE, PTA 05/13/2024, 11:34 AM

## 2024-05-17 ENCOUNTER — Ambulatory Visit: Admitting: Physical Therapy

## 2024-05-17 DIAGNOSIS — M25562 Pain in left knee: Secondary | ICD-10-CM | POA: Diagnosis not present

## 2024-05-17 DIAGNOSIS — M25662 Stiffness of left knee, not elsewhere classified: Secondary | ICD-10-CM

## 2024-05-17 DIAGNOSIS — R262 Difficulty in walking, not elsewhere classified: Secondary | ICD-10-CM

## 2024-05-17 NOTE — Therapy (Signed)
 OUTPATIENT PHYSICAL THERAPY LOWER EXTREMITY     Patient Name: Debbie Hayes MRN: [id number] DOB:[birth year], 73 y.o., female Today's Date: 05/17/2024  END OF SESSION:  PT End of Session - 05/17/24 1446     Visit Number 15    Date for Recertification  06/29/24    Authorization Type [employer] medicare    PT Start Time 1445    PT Stop Time 1530    PT Time Calculation (min) 45 min            Past Medical History:  Diagnosis Date   Allergy    Asthma    Cancer (HCC)    thyroid  cancer   Cataract    Chronic migraine BOTOX INJECTION EVERY 3 MONTHS   Chronic pain in right shoulder    Constipation    Coronary artery disease    Diabetes mellitus    Disorder of inner ear CAUSES VERTIGO OCCASIONALLY   Fibromyalgia    GERD (gastroesophageal reflux disease)    Hemorrhoid    History of bladder infections    History of thyroid  cancer 2009  S/P TOTAL THYROIDECTOMY AND RADIATION   Hyperlipidemia    Hypertension CARDIOLOGIST- DR BLANCA- WILL REQUEST LATE NOTE   DENIES S & S   Hypothyroidism    Insomnia    Left shoulder pain    Macular degeneration    MRSA infection    nasal treated more than 15 years ago   Normal nuclear stress test 01/25/2009   OA (osteoarthritis) JOINTS   Osteopenia    Osteoporosis    Painful orthopaedic hardware    left foot   Pneumonia    PONV (postoperative nausea and vomiting) SEVERE   Rotator cuff disorder LEFT SHOULDER RTC IMPINGMENT   Sleep apnea    mouth guard, no cpap    Sleep apnea in adult 06/26/2017   Spondylolisthesis of lumbar region    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   Wears glasses    Past Surgical History:  Procedure Laterality Date   BACK SURGERY     Dr Mavis- spondylosis   BILATERAL CARPAL TUNNEL RELEASE  1994   BILATERAL ELBOW SURG.  1999   BILATERAL SALPINGOOPHECTOMY  1993   POST-OP URETER REPAIR 12 DAYS AFTER    CATARACT EXTRACTION, BILATERAL     COLONOSCOPY     EYE SURGERY     FOOT ARTHRODESIS Left 07/10/2016    Procedure: LEFT 2ND TARSAL METATARSAL ARTHRODESIS  GASTROC RECESSION LEFT [employer] MODIFIED MCBRIDE BUIONECTOMY;  Surgeon: Norleen Armor, MD;  Location: [facility];  Service: Orthopedics;  Laterality: Left;   GASTROC RECESSION EXTREMITY Left 07/10/2016   Procedure: GASTROC RECESSION EXTREMITY;  Surgeon: Norleen Armor, MD;  Location: [facility];  Service: Orthopedics;  Laterality: Left;   HARDWARE REMOVAL Left 05/20/2018   Procedure: Left foot removal of deep implants;  Surgeon: Armor Norleen, MD;  Location: [facility];  Service: Orthopedics;  Laterality: Left;    LEFT THUMB JOINT REPLACEMENT  2005   RIGHT DONE IN 2004   OOPHORECTOMY     POLYPECTOMY     RIGHT KNEE ARTHROSCOPY  X2  BEFORE 2011   RIGHT KNEE ARTHROSCOPY/ PARTIAL LATERAL MENISECTOMY/ TRICOMPARTMENT CHONDROPLASTY/ DECOMPRESSION CYST  10/26/2009   RIGHT KNEE CLOSED MANIPULATION  09/11/2010   RIGHT SHOULDER ARTHROSCOPY  2010  &  2004   TIBIA CYST REMOVED AND ORIF LEG FX  1958   TONSILLECTOMY  1968   TOTAL KNEE ARTHROPLASTY  07/16/2010  RIGHT   TOTAL KNEE ARTHROPLASTY Left 03/22/2024   Procedure: ARTHROPLASTY, KNEE, TOTAL;  Surgeon: Josefina Chew, MD;  Location: [facility];  Service: Orthopedics;  Laterality: Left;   TOTAL THYROIDECTOMY  2009   CANCER  (POST-OP BLEED)  AND RADIATION TX   trigger finger repair      07/2023   trigger fingers Right 2013   3rd and 4th fingers   UPPER GASTROINTESTINAL ENDOSCOPY     VAGINAL HYSTERECTOMY  1990   Patient Active Problem List   Diagnosis Date Noted   Primary hypertension 05/12/2024   Pain of right thumb 02/26/2024   Precordial pain 12/23/2023   Primary osteoarthritis of left knee with subchondral insufficiency fractures 09/23/2023   History of arthroplasty of right knee 07/03/2023   Primary osteoarthritis of right shoulder 07/03/2023   Coronary artery disease involving native coronary artery of native heart without angina pectoris  05/25/2023   Coronary artery calcification 11/06/2022   Statin myopathy 11/06/2022   Frequent UTI 10/29/2022   Wart of hand 09/30/2022   Acute cystitis without hematuria 05/14/2021   Medial epicondylitis, right 03/25/2021   Trigger thumb, right thumb 11/19/2020   Right wrist pain 10/23/2020   Osteoporosis 04/05/2020   Trigger finger, left middle finger 07/27/2019   Chronic neck pain 11/10/2018   Orthopedic hardware present 02/17/2018   Controlled diabetes mellitus type 2 with complications (HCC) 12/15/2017   Intractable chronic migraine without aura and with status migrainosus 12/15/2017   Arthralgia 12/15/2017   Joint pain 12/15/2017   Migraine, transformed 12/15/2017   Chronic migraine without aura, with intractable migraine, so stated, with status migrainosus 12/15/2017   Pain of left shoulder joint on movement 10/19/2017   OSA (obstructive sleep apnea) 06/26/2017   Snoring 03/25/2016   Palpitations 03/25/2016   Headache 03/25/2016   Sensory disorder of trigeminal nerve 11/26/2015   Spondylolisthesis of lumbar region 08/09/2014   Lumbar adjacent segment disease with spondylolisthesis 08/09/2014   Back pain 02/17/2014   Macular degeneration 02/17/2014   Backache 02/17/2014   Other and unspecified hyperlipidemia 10/14/2013   Hyperlipidemia 10/14/2013   Onychomycosis 12/01/2012   Arthritis 04/12/2012   Hypothyroidism 04/12/2012    PCP: Watt, MD  REFERRING PROVIDER: Josefina, MD  REFERRING DIAG: s/p Left TKA  THERAPY DIAG:  Stiffness of left knee, not elsewhere classified  Difficulty in walking, not elsewhere classified  Rationale for Evaluation and Treatment: Rehabilitation  ONSET DATE: 03/22/24  SUBJECTIVE:   SUBJECTIVE STATEMENT:    really hurting today. Leg press was no good last time and only want to do either bike or Nustep    PAIN:  Are you having pain? Left knee 7/10 PRECAUTIONS: None  RED FLAGS: None   WEIGHT BEARING RESTRICTIONS: No  FALLS:   Has patient fallen in last 6 months? No  LIVING ENVIRONMENT: Lives with: lives with their family Lives in: House/apartment Stairs: Yes: Internal: 2 steps; can reach both Has following equipment at home: Single point cane, Walker - 2 wheeled, Crutches, and shower chair  OCCUPATION: retired  PLOF: Independent and shopping and housework  PATIENT GOALS: have normal walk without device, less pain  NEXT MD VISIT: 04/06/24  OBJECTIVE:  Note: Objective measures were completed at Evaluation unless otherwise noted.   COGNITION: Overall cognitive status: Within functional limits for tasks assessed     SENSATION: WFL  EDEMA:  Circumferential: mid patella 47 cm on the left, 40 cm on the right  MUSCLE LENGTH: Tight calves and HS  POSTURE: rounded shoulders and forward  head  PALPATION: Tender, ecchymosis left thigh and knee, inner and posterior  LOWER EXTREMITY ROM:  Active ROM AROM LEft eval PROM Left eval LLE 04/01/24 AROM  LLE 04/04/24 04/26/24 04/29/24 05/03/24 AROM/PROM  Hip flexion         Hip extension         Hip abduction         Hip adduction         Hip internal rotation         Hip external rotation         Knee flexion 65 70 82 85 112 110 112/117  Knee extension 15 9 5 5  0 0 4/0  Ankle dorsiflexion         Ankle plantarflexion         Ankle inversion         Ankle eversion          (Blank rows = not tested)  LOWER EXTREMITY MMT:  MMT Right eval Left eval  Hip flexion    Hip extension    Hip abduction    Hip adduction    Hip internal rotation    Hip external rotation    Knee flexion  2  Knee extension  2  Ankle dorsiflexion    Ankle plantarflexion    Ankle inversion    Ankle eversion     (Blank rows = not tested)  LOWER EXTREMITY SPECIAL TESTS:  Knee special tests: quad lag, ballotable patella  FUNCTIONAL TESTS:  Timed up and go (TUG): 17 seconds  GAIT: Distance walked: 60 feet Assistive device utilized: Environmental consultant - 2 wheeled Level of  assistance: Modified independence Comments: mild antalgic gait                                                                                                                                TREATMENT DATE:   05/17/24 STW to left knee and quad Jt mobs including patella Jt capsule stretch PROM flex and ext Bike L 4 Green tband HS 5x pain so decreased to red still pain so stopped STW to left lateral knee- very tender   05/13/24 STW to left knee, quad and incision with joint mobs PROM flex and ext with end range stretch AROM sitting after 0-120 Bike 6 min seat 12 L 4 Nustep L 5 6 min LE only Leg Press 30# 2 sets 10- seat 7, 6 too hard 3# LAQ 2 sets 10 Green tband HS curl 2 sets 10   05/10/24 ACT flexion  118 PROM and jt mobs flex and ext Nustep L 4 5 min Bike 5 min full rev 3# LAQ 2 sets 10 3# standing march,HS curl, SL 3 way 10x 6 in step up 10 x Red tband HS curl 2 sets 10 Leg press 30# 2 sets 10  05/06/24 PROM flex and ext HS curl 20# 10 x Knee ext 5# 10 x Bike 5 min  full revolutions Nustep L 4 Left foot on dyna disc motions  LAQ 3# 10 x Standing 3# marching, hip abd, ext and HS curl 10 x each    05/03/24 Nustep level 4 x 6 minutes Bike seat 11 x 4 minutes full revolutions HS curls 20# Leg ext 5# Passive stretch Left LE Joint mobs Scar mobs Left foot on dyna disc motions  AROM 4-112 degrees PROM 0-117 degrees TUG 11 seconds without device  04/29/24 Recheck goals for 10th visit  NuStep L5 x82mins LE only  Bike seat 11 x33mins for ROM Leg ext 5# 2x10 HS curls 20# 2x10 STS 2x10 Leg press 20# 2x10 Step ups 4   04/26/24 PROM, stretching and STW Left knee and ankle/foot Nustep L 5 Func stepping multi directional for ROM Looked at gait but pt struggling d/t pain in foot and great toe AROM 0-112   04/22/24 Nustep L 5 LE only HS curl 20# 2 sets 10. 10# 10 x LLE Knee ext 5# 2 sets 10 PROM flex and ext STW to knee ,scr and  quad 3# LAQ with focus on TKE 2 sets 10 Green tband HS curl 2 sets 10 Act flexion end of session 110  04/19/24 Nustep L 5 LE only Green tband HS curl 2 sets 10 3# LAQ 2 sets 10 3# standing HS curl 2 sets 10 3# 8 in step tap 2 sets 10 6 inch step up fwd and laterally 10 x each Green tband TKE 2 sets 10 PROM flex and ext AROM after 0-103 Leg Press 30# 2 sets 10 Black bar DF and PF 15 x STS wt ball 10 x Resisted gait fwd,back and side ways 20#   04/14/24 Nustep L 5 LE only LAQ 2# 2 sets 10 HS curls red tband 2 sets 10 ( decreased to red to increase ROM) SAQ 10 x TKE with manual resistance 10 x 6 inch step up fwd and laterally 10 x each PROM flex and ext AROM sitting EOB  2-100 Resisted squats 2 sets 10 green tband Leg press 20 # 2 sets 10    04/11/24 NuStep L5x75mins  LAQ 2# 2x10 HS curls green 2x10 Gentle PROM flexion/ext, patellar mobs AROM 95d STS 2x10 Step ups 6  HEP review  04/07/24 Nustep L 4 LAQ 2 sets 10 HS curl 2 sets 10 Leg press 20# 2 sets 10 Step stretch, step tap and calf stretch on step -HEP review and answered questions. STM to the quad and ITB area Gentle PROM into flexion and ext- tolerated fair if gentle    04/04/24 Bike partial reps x 5 minutes Gait without device x 200 feet LAQ 2x10 SAQ 2x10 STM and scar mobilization to the left knee  Feet on ball K2C, rotation, small bridge and isometric abs Ball b/n knees squeeze STM to the quad and ITB area Gentle PROM into flexion she does not tolerate this well Gait without device 200 feet SBA  04/01/24 Nustep L 4 PROM flex and ext Pat mobs Gentle STW to LEft thigh LAQ 2 sets 10 Yellow tband HS curl 2 sets 10 Step stretch Step up Calf stretch on step VASO low flat 6 min 03/30/24 Evaluation  Nustep level 3 x 5 minutes Vaso low pressure 34 degrees with elevation   PATIENT EDUCATION:  Education details: POC/HEP Person educated: Patient Education method: Explanation,  Facilities manager, and Handouts Education comprehension: verbalized understanding  HOME EXERCISE PROGRAM: Access Code: [email or web address] URL: [email or web address] Date: 04/11/2024  Prepared by: Almetta Fam  Exercises - Standing Knee Flexion Stretch on Step  - 3- x daily - 7 x weekly - 1 sets - 5-10 reps - 5-10 hold - Heel Raise on Step  - 3 x daily - 7 x weekly - 1 sets - 10 reps - Standing Gastroc Stretch on Step  - 3 x daily - 7 x weekly - 1 sets - 10 reps - Forward Step Touch  - 3 x daily - 7 x weekly - 1 sets - 10 reps - Seated Knee Flexion Slide  - 3 x daily - 7 x weekly - 1 sets - 10 reps - Sit to Stand  - 3 x daily - 7 x weekly - 1 sets - 10 reps  ASSESSMENT:  CLINICAL IMPRESSION: Pt arrived with increased pain and requesting less exercises. Focus on minimal ex and more MT  and she tolerated well. OBJECTIVE IMPAIRMENTS: Abnormal gait, cardiopulmonary status limiting activity, decreased activity tolerance, decreased balance, decreased endurance, decreased mobility, difficulty walking, decreased ROM, decreased strength, increased edema, increased muscle spasms, impaired flexibility, impaired vision/preception, improper body mechanics, postural dysfunction, and pain.   REHAB POTENTIAL: Good  CLINICAL DECISION MAKING: Evolving/moderate complexity  EVALUATION COMPLEXITY: Moderate   GOALS: Goals reviewed with patient? Yes  SHORT TERM GOALS: Target date: 04/20/24 Independent with initial HEP Baseline: Goal status: 04/01/24 MET  LONG TERM GOALS: Target date: 06/29/24  Independent with advanced HEP Baseline:  Goal status: progressing10/7/25  and 05/13/24  2.  Independent with RICE Baseline:  Goal status: MET 04/14/24  3.  Decrease pain 50% for better function and quality of life Baseline:  Goal status: progressing 04/14/24, not 50% yet progressing 04/29/24 05/06/24 progressing  05/13/24 40-50% PROGRESSING  4.  Decrease TUG to 12 seconds without device Baseline:  Goal  status: 04/22/24 MET, 11 seconds without device 05/03/24  5.  Increase AROM of the left knee to 4-116 degrees flexion Baseline:  Goal status: 04/14/24 2-100 progressing  and 04/22/24, 0-110d 04/29/24 progressing 05/13/24 met  6.  Go up and down steps step over step Baseline:  Goal status: 04/14/24 progressing, progressing 04/29/24 and 05/06/24   PLAN:  PT FREQUENCY: 2x/week  PT DURATION: 12 weeks  PLANNED INTERVENTIONS: 97164- PT Re-evaluation, 97110-Therapeutic exercises, 97530- Therapeutic activity, 97112- Neuromuscular re-education, 97535- Self Care, [id number]- Manual therapy, [phone]- Gait training, [phone]- Electrical stimulation (unattended), 97016- Vasopneumatic device, Patient/Family education, Balance training, Stair training, Taping, Joint mobilization, and Cryotherapy  PLAN FOR NEXT SESSION: continue to progress ROM and strength slowly   Aleiya Rye,ANGIE, PTA 05/17/2024, 2:47 PM

## 2024-05-18 ENCOUNTER — Other Ambulatory Visit: Payer: Self-pay | Admitting: Family Medicine

## 2024-05-19 ENCOUNTER — Ambulatory Visit: Admitting: Physical Therapy

## 2024-05-23 ENCOUNTER — Encounter: Payer: Self-pay | Admitting: Family Medicine

## 2024-05-23 ENCOUNTER — Ambulatory Visit

## 2024-05-23 DIAGNOSIS — M25562 Pain in left knee: Secondary | ICD-10-CM | POA: Diagnosis not present

## 2024-05-23 DIAGNOSIS — R262 Difficulty in walking, not elsewhere classified: Secondary | ICD-10-CM

## 2024-05-23 DIAGNOSIS — R600 Localized edema: Secondary | ICD-10-CM

## 2024-05-23 DIAGNOSIS — M25662 Stiffness of left knee, not elsewhere classified: Secondary | ICD-10-CM

## 2024-05-23 NOTE — Therapy (Signed)
 OUTPATIENT PHYSICAL THERAPY LOWER EXTREMITY     Patient Name: Debbie Hayes MRN: [id number] DOB:1951/01/08, 73 y.o., female Today's Date: 05/23/2024  END OF SESSION:  PT End of Session - 05/23/24 1258     Visit Number 16    Date for Recertification  06/29/24    Authorization Type [employer] medicare    PT Start Time 1258    PT Stop Time 1340    PT Time Calculation (min) 42 min             Past Medical History:  Diagnosis Date   Allergy    Asthma    Cancer (HCC)    thyroid  cancer   Cataract    Chronic migraine BOTOX INJECTION EVERY 3 MONTHS   Chronic pain in right shoulder    Constipation    Coronary artery disease    Diabetes mellitus    Disorder of inner ear CAUSES VERTIGO OCCASIONALLY   Fibromyalgia    GERD (gastroesophageal reflux disease)    Hemorrhoid    History of bladder infections    History of thyroid  cancer 2009  S/P TOTAL THYROIDECTOMY AND RADIATION   Hyperlipidemia    Hypertension CARDIOLOGIST- DR BLANCA- WILL REQUEST LATE NOTE   DENIES S & S   Hypothyroidism    Insomnia    Left shoulder pain    Macular degeneration    MRSA infection    nasal treated more than 15 years ago   Normal nuclear stress test 01/25/2009   OA (osteoarthritis) JOINTS   Osteopenia    Osteoporosis    Painful orthopaedic hardware    left foot   Pneumonia    PONV (postoperative nausea and vomiting) SEVERE   Rotator cuff disorder LEFT SHOULDER RTC IMPINGMENT   Sleep apnea    mouth guard, no cpap    Sleep apnea in adult 06/26/2017   Spondylolisthesis of lumbar region    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   Wears glasses    Past Surgical History:  Procedure Laterality Date   BACK SURGERY     Dr Mavis- spondylosis   BILATERAL CARPAL TUNNEL RELEASE  1994   BILATERAL ELBOW SURG.  1999   BILATERAL SALPINGOOPHECTOMY  1993   POST-OP URETER REPAIR 12 DAYS AFTER    CATARACT EXTRACTION, BILATERAL     COLONOSCOPY     EYE SURGERY     FOOT ARTHRODESIS Left 07/10/2016    Procedure: LEFT 2ND TARSAL METATARSAL ARTHRODESIS  GASTROC RECESSION LEFT LAPIDUS MODIFIED MCBRIDE BUIONECTOMY;  Surgeon: Norleen Armor, MD;  Location: [facility];  Service: Orthopedics;  Laterality: Left;   GASTROC RECESSION EXTREMITY Left 07/10/2016   Procedure: GASTROC RECESSION EXTREMITY;  Surgeon: Norleen Armor, MD;  Location: [facility];  Service: Orthopedics;  Laterality: Left;   HARDWARE REMOVAL Left 05/20/2018   Procedure: Left foot removal of deep implants;  Surgeon: Armor Norleen, MD;  Location:  SURGERY CENTER;  Service: Orthopedics;  Laterality: Left;    LEFT THUMB JOINT REPLACEMENT  2005   RIGHT DONE IN 2004   OOPHORECTOMY     POLYPECTOMY     RIGHT KNEE ARTHROSCOPY  X2  BEFORE 2011   RIGHT KNEE ARTHROSCOPY/ PARTIAL LATERAL MENISECTOMY/ TRICOMPARTMENT CHONDROPLASTY/ DECOMPRESSION CYST  10/26/2009   RIGHT KNEE CLOSED MANIPULATION  09/11/2010   RIGHT SHOULDER ARTHROSCOPY  2010  &  2004   TIBIA CYST REMOVED AND ORIF LEG FX  1958   TONSILLECTOMY  1968   TOTAL KNEE ARTHROPLASTY  07/16/2010  RIGHT   TOTAL KNEE ARTHROPLASTY Left 03/22/2024   Procedure: ARTHROPLASTY, KNEE, TOTAL;  Surgeon: Josefina Chew, MD;  Location: [facility];  Service: Orthopedics;  Laterality: Left;   TOTAL THYROIDECTOMY  2009   CANCER  (POST-OP BLEED)  AND RADIATION TX   trigger finger repair      07/2023   trigger fingers Right 2013   3rd and 4th fingers   UPPER GASTROINTESTINAL ENDOSCOPY     VAGINAL HYSTERECTOMY  1990   Patient Active Problem List   Diagnosis Date Noted   Primary hypertension 05/12/2024   Pain of right thumb 02/26/2024   Precordial pain 12/23/2023   Primary osteoarthritis of left knee with subchondral insufficiency fractures 09/23/2023   History of arthroplasty of right knee 07/03/2023   Primary osteoarthritis of right shoulder 07/03/2023   Coronary artery disease involving native coronary artery of native heart without angina pectoris  05/25/2023   Coronary artery calcification 11/06/2022   Statin myopathy 11/06/2022   Frequent UTI 10/29/2022   Wart of hand 09/30/2022   Acute cystitis without hematuria 05/14/2021   Medial epicondylitis, right 03/25/2021   Trigger thumb, right thumb 11/19/2020   Right wrist pain 10/23/2020   Osteoporosis 04/05/2020   Trigger finger, left middle finger 07/27/2019   Chronic neck pain 11/10/2018   Orthopedic hardware present 02/17/2018   Controlled diabetes mellitus type 2 with complications (HCC) 12/15/2017   Intractable chronic migraine without aura and with status migrainosus 12/15/2017   Arthralgia 12/15/2017   Joint pain 12/15/2017   Migraine, transformed 12/15/2017   Chronic migraine without aura, with intractable migraine, so stated, with status migrainosus 12/15/2017   Pain of left shoulder joint on movement 10/19/2017   OSA (obstructive sleep apnea) 06/26/2017   Snoring 03/25/2016   Palpitations 03/25/2016   Headache 03/25/2016   Sensory disorder of trigeminal nerve 11/26/2015   Spondylolisthesis of lumbar region 08/09/2014   Lumbar adjacent segment disease with spondylolisthesis 08/09/2014   Back pain 02/17/2014   Macular degeneration 02/17/2014   Backache 02/17/2014   Other and unspecified hyperlipidemia 10/14/2013   Hyperlipidemia 10/14/2013   Onychomycosis 12/01/2012   Arthritis 04/12/2012   Hypothyroidism 04/12/2012    PCP: Copland, MD  REFERRING PROVIDER: Josefina, MD  REFERRING DIAG: s/p Left TKA  THERAPY DIAG:  Stiffness of left knee, not elsewhere classified  Difficulty in walking, not elsewhere classified  Acute pain of left knee  Localized edema  Rationale for Evaluation and Treatment: Rehabilitation  ONSET DATE: 03/22/24  SUBJECTIVE:   SUBJECTIVE STATEMENT:  I am not good. 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 L knee is still very painful.     PAIN:  Are you having pain? Left knee 7/10 PRECAUTIONS: None  RED FLAGS: None   WEIGHT  BEARING RESTRICTIONS: No  FALLS:  Has patient fallen in last 6 months? No  LIVING ENVIRONMENT: Lives with: lives with their family Lives in: House/apartment Stairs: Yes: Internal: 2 steps; can reach both Has following equipment at home: Single point cane, Walker - 2 wheeled, Crutches, and shower chair  OCCUPATION: retired  PLOF: Independent and shopping and housework  PATIENT GOALS: have normal walk without device, less pain  NEXT MD VISIT: 04/06/24  OBJECTIVE:  Note: Objective measures were completed at Evaluation unless otherwise noted.   COGNITION: Overall cognitive status: Within functional limits for tasks assessed     SENSATION: WFL  EDEMA:  Circumferential: mid patella 47 cm on the left, 40 cm on the right  MUSCLE LENGTH: Tight calves and HS  POSTURE: rounded shoulders and forward head  PALPATION: Tender, ecchymosis left thigh and knee, inner and posterior  LOWER EXTREMITY ROM:  Active ROM AROM LEft eval PROM Left eval LLE 04/01/24 AROM  LLE 04/04/24 04/26/24 04/29/24 05/03/24 AROM/PROM  Hip flexion         Hip extension         Hip abduction         Hip adduction         Hip internal rotation         Hip external rotation         Knee flexion 65 70 82 85 112 110 112/117  Knee extension 15 9 5 5  0 0 4/0  Ankle dorsiflexion         Ankle plantarflexion         Ankle inversion         Ankle eversion          (Blank rows = not tested)  LOWER EXTREMITY MMT:  MMT Right eval Left eval  Hip flexion    Hip extension    Hip abduction    Hip adduction    Hip internal rotation    Hip external rotation    Knee flexion  2  Knee extension  2  Ankle dorsiflexion    Ankle plantarflexion    Ankle inversion    Ankle eversion     (Blank rows = not tested)  LOWER EXTREMITY SPECIAL TESTS:  Knee special tests: quad lag, ballotable patella  FUNCTIONAL TESTS:  Timed up and go (TUG): 17 seconds  GAIT: Distance walked: 60 feet Assistive device  utilized: Walker - 2 wheeled Level of assistance: Modified independence Comments: mild antalgic gait                                                                                                                                TREATMENT DATE:  05/23/24 PROM to L knee  ROM- 115d flexion ITB stretch 30s x2 HS stretch 30s x2 SLR x10, then with abd x10  NuStep L5 x54mins LE  LAQ 3# 2x12 HS curls red 2x10 Hip abd 2x12 green Calf stretch 20s x2   05/17/24 STW to left knee and quad Jt mobs including patella Jt capsule stretch PROM flex and ext Bike L 4 Green tband HS 5x pain so decreased to red still pain so stopped STW to left lateral knee- very tender   05/13/24 STW to left knee, quad and incision with joint mobs PROM flex and ext with end range stretch AROM sitting after 0-120 Bike 6 min seat 12 L 4 Nustep L 5 6 min LE only Leg Press 30# 2 sets 10- seat 7, 6 too hard 3# LAQ 2 sets 10 Green tband HS curl 2 sets 10   05/10/24 ACT flexion  118 PROM and jt mobs flex and ext Nustep L 4 5 min Bike 5 min full rev 3#  LAQ 2 sets 10 3# standing march,HS curl, SL 3 way 10x 6 in step up 10 x Red tband HS curl 2 sets 10 Leg press 30# 2 sets 10  05/06/24 PROM flex and ext HS curl 20# 10 x Knee ext 5# 10 x Bike 5 min full revolutions Nustep L 4 Left foot on dyna disc motions  LAQ 3# 10 x Standing 3# marching, hip abd, ext and HS curl 10 x each    05/03/24 Nustep level 4 x 6 minutes Bike seat 11 x 4 minutes full revolutions HS curls 20# Leg ext 5# Passive stretch Left LE Joint mobs Scar mobs Left foot on dyna disc motions  AROM 4-112 degrees PROM 0-117 degrees TUG 11 seconds without device  04/29/24 Recheck goals for 10th visit  NuStep L5 x61mins LE only  Bike seat 11 x32mins for ROM Leg ext 5# 2x10 HS curls 20# 2x10 STS 2x10 Leg press 20# 2x10 Step ups 4   04/26/24 PROM, stretching and STW Left knee and ankle/foot Nustep L 5 Func  stepping multi directional for ROM Looked at gait but pt struggling d/t pain in foot and great toe AROM 0-112   04/22/24 Nustep L 5 LE only HS curl 20# 2 sets 10. 10# 10 x LLE Knee ext 5# 2 sets 10 PROM flex and ext STW to knee ,scr and quad 3# LAQ with focus on TKE 2 sets 10 Green tband HS curl 2 sets 10 Act flexion end of session 110  04/19/24 Nustep L 5 LE only Green tband HS curl 2 sets 10 3# LAQ 2 sets 10 3# standing HS curl 2 sets 10 3# 8 in step tap 2 sets 10 6 inch step up fwd and laterally 10 x each Green tband TKE 2 sets 10 PROM flex and ext AROM after 0-103 Leg Press 30# 2 sets 10 Black bar DF and PF 15 x STS wt ball 10 x Resisted gait fwd,back and side ways 20#   04/14/24 Nustep L 5 LE only LAQ 2# 2 sets 10 HS curls red tband 2 sets 10 ( decreased to red to increase ROM) SAQ 10 x TKE with manual resistance 10 x 6 inch step up fwd and laterally 10 x each PROM flex and ext AROM sitting EOB  2-100 Resisted squats 2 sets 10 green tband Leg press 20 # 2 sets 10    04/11/24 NuStep L5x23mins  LAQ 2# 2x10 HS curls green 2x10 Gentle PROM flexion/ext, patellar mobs AROM 95d STS 2x10 Step ups 6  HEP review  04/07/24 Nustep L 4 LAQ 2 sets 10 HS curl 2 sets 10 Leg press 20# 2 sets 10 Step stretch, step tap and calf stretch on step -HEP review and answered questions. STM to the quad and ITB area Gentle PROM into flexion and ext- tolerated fair if gentle    04/04/24 Bike partial reps x 5 minutes Gait without device x 200 feet LAQ 2x10 SAQ 2x10 STM and scar mobilization to the left knee  Feet on ball K2C, rotation, small bridge and isometric abs Ball b/n knees squeeze STM to the quad and ITB area Gentle PROM into flexion she does not tolerate this well Gait without device 200 feet SBA  04/01/24 Nustep L 4 PROM flex and ext Pat mobs Gentle STW to LEft thigh LAQ 2 sets 10 Yellow tband HS curl 2 sets 10 Step stretch Step  up Calf stretch on step VASO low flat  6 min 03/30/24 Evaluation  Nustep level 3 x 5 minutes Vaso low pressure 34 degrees with elevation   PATIENT EDUCATION:  Education details: POC/HEP Person educated: Patient Education method: Explanation, Facilities Manager, and Handouts Education comprehension: verbalized understanding  HOME EXERCISE PROGRAM: Access Code: [email or web address] URL: [email or web address] Date: 04/11/2024 Prepared by: Almetta Fam  Exercises - Standing Knee Flexion Stretch on Step  - 3- x daily - 7 x weekly - 1 sets - 5-10 reps - 5-10 hold - Heel Raise on Step  - 3 x daily - 7 x weekly - 1 sets - 10 reps - Standing Gastroc Stretch on Step  - 3 x daily - 7 x weekly - 1 sets - 10 reps - Forward Step Touch  - 3 x daily - 7 x weekly - 1 sets - 10 reps - Seated Knee Flexion Slide  - 3 x daily - 7 x weekly - 1 sets - 10 reps - Sit to Stand  - 3 x daily - 7 x weekly - 1 sets - 10 reps  ASSESSMENT:  CLINICAL IMPRESSION: Pt arrived with increased pain and requesting less exercises. She took a break from doing her HEP. 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 She does have positive Noble's test and some popping with ITB over the bone. Focus on minimal exercise that she could tolerate.   OBJECTIVE IMPAIRMENTS: Abnormal gait, cardiopulmonary status limiting activity, decreased activity tolerance, decreased balance, decreased endurance, decreased mobility, difficulty walking, decreased ROM, decreased strength, increased edema, increased muscle spasms, impaired flexibility, impaired vision/preception, improper body mechanics, postural dysfunction, and pain.   REHAB POTENTIAL: Good  CLINICAL DECISION MAKING: Evolving/moderate complexity  EVALUATION COMPLEXITY: Moderate   GOALS: Goals reviewed with patient? Yes  SHORT TERM GOALS: Target date: 04/20/24 Independent with initial HEP Baseline: Goal status: 04/01/24 MET  LONG TERM GOALS:  Target date: 06/29/24  Independent with advanced HEP Baseline:  Goal status: progressing10/7/25  and 05/13/24  2.  Independent with RICE Baseline:  Goal status: MET 04/14/24  3.  Decrease pain 50% for better function and quality of life Baseline:  Goal status: progressing 04/14/24, not 50% yet progressing 04/29/24 05/06/24 progressing  05/13/24 40-50% PROGRESSING  4.  Decrease TUG to 12 seconds without device Baseline:  Goal status: 04/22/24 MET, 11 seconds without device 05/03/24  5.  Increase AROM of the left knee to 4-116 degrees flexion Baseline:  Goal status: 04/14/24 2-100 progressing  and 04/22/24, 0-110d 04/29/24 progressing 05/13/24 met  6.  Go up and down steps step over step Baseline:  Goal status: 04/14/24 progressing, progressing 04/29/24 and 05/06/24   PLAN:  PT FREQUENCY: 2x/week  PT DURATION: 12 weeks  PLANNED INTERVENTIONS: 97164- PT Re-evaluation, 97110-Therapeutic exercises, 97530- Therapeutic activity, 97112- Neuromuscular re-education, 97535- Self Care, [id number]- Manual therapy, [phone]- Gait training, [phone]- Electrical stimulation (unattended), 97016- Vasopneumatic device, Patient/Family education, Balance training, Stair training, Taping, Joint mobilization, and Cryotherapy  PLAN FOR NEXT SESSION: continue to progress ROM and strength slowly   Almetta Fam, PT 05/23/2024, 1:38 PM

## 2024-05-25 ENCOUNTER — Encounter: Payer: Self-pay | Admitting: Urology

## 2024-05-25 ENCOUNTER — Other Ambulatory Visit: Payer: Self-pay | Admitting: Family Medicine

## 2024-05-25 ENCOUNTER — Ambulatory Visit: Admitting: Urology

## 2024-05-25 VITALS — BP 134/76 | HR 85 | Ht 68.0 in | Wt 152.0 lb

## 2024-05-25 DIAGNOSIS — Z09 Encounter for follow-up examination after completed treatment for conditions other than malignant neoplasm: Secondary | ICD-10-CM | POA: Diagnosis not present

## 2024-05-25 DIAGNOSIS — Z87440 Personal history of urinary (tract) infections: Secondary | ICD-10-CM

## 2024-05-25 NOTE — Progress Notes (Signed)
 Assessment: 1. History of UTI     Plan: Continue methods to reduce the risk of UTI's including increased fluid intake, timed and double voiding, daily cranberry supplement, daily probiotics, and vaginal hormone replacement. Continue vaginal hormone cream 2-3 times per week Continue daily cranberry supplement and daily probiotic. She has a prescription for Cipro  500 mg BID x 5 days provided. Return to office prn  Chief Complaint:  Chief Complaint  Patient presents with   Frequent UTI    History of Present Illness:  Debbie Hayes is a 73 y.o. female who is seen for further evaluation of frequent UTI's. She has a long history of frequent UTIs and has previously been evaluated by Dr. Ottelin at [facility].  She was last seen there in 11/19.  She reports frequent UTIs for approximately 25 years.  Typical symptoms include urgency, bladder pain, and slight dysuria.  She does not have fevers, chills, flank pain, or gross hematuria. She was being managed with Macrodantin  50 mg nightly.  She was previously evaluated in [place]  with ultrasound, CT, and cystoscopy which were unremarkable.  Urine culture results: 6/22 >100K Klebsiella 7/22 >100K Enterococcus 10/22 >100K E. Coli 7/23 >100K Klebsiella 9/23 >100K Klebsiella 9/23 No growth 10/23 >100K Klebsiella 2/24 50-100K Enterococcus 2/24 No growth 3/24 Mixed flora  No history of breast or GYN malignancy.  At her visit in May 2024, she had not had any recent UTI symptoms.  She was on daily cranberry supplement and a daily probiotic.  She was using vaginal hormone cream 2 times per week.  No dysuria or gross hematuria.    She had UTI symptoms in June 2024 and took cefdinir  with resolution.  She again developed UTI symptoms in February 2025.  She took cefdinir  but her symptoms did not completely resolve.  No urine sample was obtained at that time.  She had a urinalysis done in early April 2025 which was suspicious for  UTI.  Urine culture grew >100 K Enterococcus.  She was treated with Cipro .  At her visit in April 2025, she was not having any UTI symptoms.  She continued on vaginal estrogen cream, daily cranberry and daily probiotic.  She returns today for follow-up.  She has not had any UTI symptoms in the past 6 months.  She continues on vaginal estrogen cream, daily cranberry supplement and a daily probiotic.  No urinary symptoms at this time.  Portions of the above documentation were copied from a prior visit for review purposes only.  Past Medical History:  Past Medical History:  Diagnosis Date   Allergy    Asthma    Cancer (HCC)    thyroid  cancer   Cataract    Chronic migraine BOTOX INJECTION EVERY 3 MONTHS   Chronic pain in right shoulder    Constipation    Coronary artery disease    Diabetes mellitus    Disorder of inner ear CAUSES VERTIGO OCCASIONALLY   Fibromyalgia    GERD (gastroesophageal reflux disease)    Hemorrhoid    History of bladder infections    History of thyroid  cancer 2009  S/P TOTAL THYROIDECTOMY AND RADIATION   Hyperlipidemia    Hypertension CARDIOLOGIST- DR BLANCA- WILL REQUEST LATE NOTE   DENIES S & S   Hypothyroidism    Insomnia    Left shoulder pain    Macular degeneration    MRSA infection    nasal treated more than 15 years ago   Normal nuclear stress test 01/25/2009  OA (osteoarthritis) JOINTS   Osteopenia    Osteoporosis    Painful orthopaedic hardware    left foot   Pneumonia    PONV (postoperative nausea and vomiting) SEVERE   Rotator cuff disorder LEFT SHOULDER RTC IMPINGMENT   Sleep apnea    mouth guard, no cpap    Sleep apnea in adult 06/26/2017   Spondylolisthesis of lumbar region    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   Wears glasses     Past Surgical History:  Past Surgical History:  Procedure Laterality Date   BACK SURGERY     Dr Mavis- spondylosis   BILATERAL CARPAL TUNNEL RELEASE  1994   BILATERAL ELBOW SURG.  1999    BILATERAL SALPINGOOPHECTOMY  1993   POST-OP URETER REPAIR 12 DAYS AFTER    CATARACT EXTRACTION, BILATERAL     COLONOSCOPY     EYE SURGERY     FOOT ARTHRODESIS Left 07/10/2016   Procedure: LEFT 2ND TARSAL METATARSAL ARTHRODESIS  GASTROC RECESSION LEFT LAPIDUS MODIFIED MCBRIDE BUIONECTOMY;  Surgeon: Norleen Armor, MD;  Location: [facility];  Service: Orthopedics;  Laterality: Left;   GASTROC RECESSION EXTREMITY Left 07/10/2016   Procedure: GASTROC RECESSION EXTREMITY;  Surgeon: Norleen Armor, MD;  Location: [facility];  Service: Orthopedics;  Laterality: Left;   HARDWARE REMOVAL Left 05/20/2018   Procedure: Left foot removal of deep implants;  Surgeon: Armor Norleen, MD;  Location: [facility];  Service: Orthopedics;  Laterality: Left;    LEFT THUMB JOINT REPLACEMENT  2005   RIGHT DONE IN 2004   OOPHORECTOMY     POLYPECTOMY     RIGHT KNEE ARTHROSCOPY  X2  BEFORE 2011   RIGHT KNEE ARTHROSCOPY/ PARTIAL LATERAL MENISECTOMY/ TRICOMPARTMENT CHONDROPLASTY/ DECOMPRESSION CYST  10/26/2009   RIGHT KNEE CLOSED MANIPULATION  09/11/2010   RIGHT SHOULDER ARTHROSCOPY  2010  &  2004   TIBIA CYST REMOVED AND ORIF LEG FX  1958   TONSILLECTOMY  1968   TOTAL KNEE ARTHROPLASTY  07/16/2010   RIGHT   TOTAL KNEE ARTHROPLASTY Left 03/22/2024   Procedure: ARTHROPLASTY, KNEE, TOTAL;  Surgeon: Josefina Chew, MD;  Location: [facility];  Service: Orthopedics;  Laterality: Left;   TOTAL THYROIDECTOMY  2009   CANCER  (POST-OP BLEED)  AND RADIATION TX   trigger finger repair      07/2023   trigger fingers Right 2013   3rd and 4th fingers   UPPER GASTROINTESTINAL ENDOSCOPY     VAGINAL HYSTERECTOMY  1990    Allergies:  Allergies  Allergen Reactions   Sulfa Antibiotics Hives   Statins Other (See Comments)    Pt has tried multiple and cannot tolerate    Family History:  Family History  Problem Relation Age of Onset   Alzheimer's disease Mother    Migraines Mother     Heart disease Father    Cirrhosis Father    Heart attack Father    Macular degeneration Father    Migraines Sister    Migraines Maternal Grandmother    Migraines Niece    Colon cancer Neg Hx    Esophageal cancer Neg Hx    Rectal cancer Neg Hx    Stomach cancer Neg Hx    Colon polyps Neg Hx     Social History:  Social History   Tobacco Use   Smoking status: Never   Smokeless tobacco: Never  Vaping Use   Vaping status: Never Used  Substance Use Topics   Alcohol use:  Yes    Comment: RARE   Drug use: No    ROS: Constitutional:  Negative for fever, chills, weight loss CV: Negative for chest pain, previous MI, hypertension Respiratory:  Negative for shortness of breath, wheezing, sleep apnea, frequent cough GI:  Negative for nausea, vomiting, bloody stool, GERD  Physical exam: BP 134/76   Pulse 85   Ht 5' 8 (1.727 m)   Wt 152 lb (68.9 kg)   BMI 23.11 kg/m  GENERAL APPEARANCE:  Well appearing, well developed, well nourished, NAD HEENT:  Atraumatic, normocephalic, oropharynx clear NECK:  Supple without lymphadenopathy or thyromegaly ABDOMEN:  Soft, non-tender, no masses EXTREMITIES:  Moves all extremities well, without clubbing, cyanosis, or edema NEUROLOGIC:  Alert and oriented x 3, normal gait, CN II-XII grossly intact MENTAL STATUS:  appropriate BACK:  Non-tender to palpation, No CVAT SKIN:  Warm, dry, and intact  Results: U/A: Negative

## 2024-05-26 LAB — URINALYSIS, ROUTINE W REFLEX MICROSCOPIC
Bilirubin, UA: NEGATIVE
Glucose, UA: NEGATIVE
Ketones, UA: NEGATIVE
Leukocytes,UA: NEGATIVE
Nitrite, UA: NEGATIVE
Protein,UA: NEGATIVE
RBC, UA: NEGATIVE
Specific Gravity, UA: 1.015 (ref 1.005–1.030)
Urobilinogen, Ur: 0.2 mg/dL (ref 0.2–1.0)
pH, UA: 6 (ref 5.0–7.5)

## 2024-05-27 ENCOUNTER — Ambulatory Visit

## 2024-05-27 DIAGNOSIS — R262 Difficulty in walking, not elsewhere classified: Secondary | ICD-10-CM

## 2024-05-27 DIAGNOSIS — M25562 Pain in left knee: Secondary | ICD-10-CM

## 2024-05-27 DIAGNOSIS — R600 Localized edema: Secondary | ICD-10-CM

## 2024-05-27 DIAGNOSIS — M25662 Stiffness of left knee, not elsewhere classified: Secondary | ICD-10-CM

## 2024-05-27 NOTE — Therapy (Signed)
 OUTPATIENT PHYSICAL THERAPY LOWER EXTREMITY     Patient Name: Debbie Hayes MRN: [id number] DOB:May 08, 1951, 73 y.o., female Today's Date: 05/27/2024  END OF SESSION:  PT End of Session - 05/27/24 1100     Visit Number 17    Date for Recertification  06/29/24    Authorization Type [employer] medicare    PT Start Time 1100    PT Stop Time 1145    PT Time Calculation (min) 45 min              Past Medical History:  Diagnosis Date   Allergy    Asthma    Cancer (HCC)    thyroid  cancer   Cataract    Chronic migraine BOTOX INJECTION EVERY 3 MONTHS   Chronic pain in right shoulder    Constipation    Coronary artery disease    Diabetes mellitus    Disorder of inner ear CAUSES VERTIGO OCCASIONALLY   Fibromyalgia    GERD (gastroesophageal reflux disease)    Hemorrhoid    History of bladder infections    History of thyroid  cancer 2009  S/P TOTAL THYROIDECTOMY AND RADIATION   Hyperlipidemia    Hypertension CARDIOLOGIST- DR BLANCA- WILL REQUEST LATE NOTE   DENIES S & S   Hypothyroidism    Insomnia    Left shoulder pain    Macular degeneration    MRSA infection    nasal treated more than 15 years ago   Normal nuclear stress test 01/25/2009   OA (osteoarthritis) JOINTS   Osteopenia    Osteoporosis    Painful orthopaedic hardware    left foot   Pneumonia    PONV (postoperative nausea and vomiting) SEVERE   Rotator cuff disorder LEFT SHOULDER RTC IMPINGMENT   Sleep apnea    mouth guard, no cpap    Sleep apnea in adult 06/26/2017   Spondylolisthesis of lumbar region    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   Wears glasses    Past Surgical History:  Procedure Laterality Date   BACK SURGERY     Dr Mavis- spondylosis   BILATERAL CARPAL TUNNEL RELEASE  1994   BILATERAL ELBOW SURG.  1999   BILATERAL SALPINGOOPHECTOMY  1993   POST-OP URETER REPAIR 12 DAYS AFTER    CATARACT EXTRACTION, BILATERAL     COLONOSCOPY     EYE SURGERY     FOOT ARTHRODESIS Left  07/10/2016   Procedure: LEFT 2ND TARSAL METATARSAL ARTHRODESIS  GASTROC RECESSION LEFT [employer] MODIFIED MCBRIDE BUIONECTOMY;  Surgeon: Norleen Armor, MD;  Location: [facility];  Service: Orthopedics;  Laterality: Left;   GASTROC RECESSION EXTREMITY Left 07/10/2016   Procedure: GASTROC RECESSION EXTREMITY;  Surgeon: Norleen Armor, MD;  Location: [facility];  Service: Orthopedics;  Laterality: Left;   HARDWARE REMOVAL Left 05/20/2018   Procedure: Left foot removal of deep implants;  Surgeon: Armor Norleen, MD;  Location: [facility];  Service: Orthopedics;  Laterality: Left;    LEFT THUMB JOINT REPLACEMENT  2005   RIGHT DONE IN 2004   OOPHORECTOMY     POLYPECTOMY     RIGHT KNEE ARTHROSCOPY  X2  BEFORE 2011   RIGHT KNEE ARTHROSCOPY/ PARTIAL LATERAL MENISECTOMY/ TRICOMPARTMENT CHONDROPLASTY/ DECOMPRESSION CYST  10/26/2009   RIGHT KNEE CLOSED MANIPULATION  09/11/2010   RIGHT SHOULDER ARTHROSCOPY  2010  &  2004   TIBIA CYST REMOVED AND ORIF LEG FX  1958   TONSILLECTOMY  1968   TOTAL KNEE ARTHROPLASTY  07/16/2010   RIGHT   TOTAL KNEE ARTHROPLASTY Left 03/22/2024   Procedure: ARTHROPLASTY, KNEE, TOTAL;  Surgeon: Josefina Chew, MD;  Location: [facility];  Service: Orthopedics;  Laterality: Left;   TOTAL THYROIDECTOMY  2009   CANCER  (POST-OP BLEED)  AND RADIATION TX   trigger finger repair      07/2023   trigger fingers Right 2013   3rd and 4th fingers   UPPER GASTROINTESTINAL ENDOSCOPY     VAGINAL HYSTERECTOMY  1990   Patient Active Problem List   Diagnosis Date Noted   Primary hypertension 05/12/2024   Pain of right thumb 02/26/2024   Precordial pain 12/23/2023   Primary osteoarthritis of left knee with subchondral insufficiency fractures 09/23/2023   History of arthroplasty of right knee 07/03/2023   Primary osteoarthritis of right shoulder 07/03/2023   Coronary artery disease involving native coronary artery of native heart without angina  pectoris 05/25/2023   Coronary artery calcification 11/06/2022   Statin myopathy 11/06/2022   Frequent UTI 10/29/2022   Wart of hand 09/30/2022   Acute cystitis without hematuria 05/14/2021   Medial epicondylitis, right 03/25/2021   Trigger thumb, right thumb 11/19/2020   Right wrist pain 10/23/2020   Osteoporosis 04/05/2020   Trigger finger, left middle finger 07/27/2019   Chronic neck pain 11/10/2018   Orthopedic hardware present 02/17/2018   Controlled diabetes mellitus type 2 with complications (HCC) 12/15/2017   Intractable chronic migraine without aura and with status migrainosus 12/15/2017   Arthralgia 12/15/2017   Joint pain 12/15/2017   Migraine, transformed 12/15/2017   Chronic migraine without aura, with intractable migraine, so stated, with status migrainosus 12/15/2017   Pain of left shoulder joint on movement 10/19/2017   OSA (obstructive sleep apnea) 06/26/2017   Snoring 03/25/2016   Palpitations 03/25/2016   Headache 03/25/2016   Sensory disorder of trigeminal nerve 11/26/2015   Spondylolisthesis of lumbar region 08/09/2014   Lumbar adjacent segment disease with spondylolisthesis 08/09/2014   Back pain 02/17/2014   Macular degeneration 02/17/2014   Backache 02/17/2014   Other and unspecified hyperlipidemia 10/14/2013   Hyperlipidemia 10/14/2013   Onychomycosis 12/01/2012   Arthritis 04/12/2012   Hypothyroidism 04/12/2012    PCP: Copland, MD  REFERRING PROVIDER: Josefina, MD  REFERRING DIAG: s/p Left TKA  THERAPY DIAG:  Stiffness of left knee, not elsewhere classified  Difficulty in walking, not elsewhere classified  Acute pain of left knee  Localized edema  Rationale for Evaluation and Treatment: Rehabilitation  ONSET DATE: 03/22/24  SUBJECTIVE:   SUBJECTIVE STATEMENT:  knee is the same, not any better.    PAIN:  Are you having pain? Left knee 7/10  PRECAUTIONS: None  RED FLAGS: None   WEIGHT BEARING RESTRICTIONS: No  FALLS:  Has  patient fallen in last 6 months? No  LIVING ENVIRONMENT: Lives with: lives with their family Lives in: House/apartment Stairs: Yes: Internal: 2 steps; can reach both Has following equipment at home: Single point cane, Walker - 2 wheeled, Crutches, and shower chair  OCCUPATION: retired  PLOF: Independent and shopping and housework  PATIENT GOALS: have normal walk without device, less pain  NEXT MD VISIT: 04/06/24  OBJECTIVE:  Note: Objective measures were completed at Evaluation unless otherwise noted.   COGNITION: Overall cognitive status: Within functional limits for tasks assessed     SENSATION: WFL  EDEMA:  Circumferential: mid patella 47 cm on the left, 40 cm on the right  MUSCLE LENGTH: Tight calves and HS  POSTURE: rounded shoulders and forward head  PALPATION: Tender, ecchymosis left thigh and knee, inner and posterior  LOWER EXTREMITY ROM:  Active ROM AROM LEft eval PROM Left eval LLE 04/01/24 AROM  LLE 04/04/24 04/26/24 04/29/24 05/03/24 AROM/PROM  Hip flexion         Hip extension         Hip abduction         Hip adduction         Hip internal rotation         Hip external rotation         Knee flexion 65 70 82 85 112 110 112/117  Knee extension 15 9 5 5  0 0 4/0  Ankle dorsiflexion         Ankle plantarflexion         Ankle inversion         Ankle eversion          (Blank rows = not tested)  LOWER EXTREMITY MMT:  MMT Right eval Left eval  Hip flexion    Hip extension    Hip abduction    Hip adduction    Hip internal rotation    Hip external rotation    Knee flexion  2  Knee extension  2  Ankle dorsiflexion    Ankle plantarflexion    Ankle inversion    Ankle eversion     (Blank rows = not tested)  LOWER EXTREMITY SPECIAL TESTS:  Knee special tests: quad lag, ballotable patella  FUNCTIONAL TESTS:  Timed up and go (TUG): 17 seconds  GAIT: Distance walked: 60 feet Assistive device utilized: Environmental Consultant - 2 wheeled Level of  assistance: Modified independence Comments: mild antalgic gait                                                                                                                                TREATMENT DATE:  05/27/24 Bike partial revs x5 mins Calf stretch Resisted gait 20# 4 way x4 Step ups 6 STS 2x10 Bridges 2x5 Sidelying hip abduction 2x5 ROM- 117d  05/23/24 PROM to L knee  ROM- 115d flexion ITB stretch 30s x2 HS stretch 30s x2 SLR x10, then with abd x10  NuStep L5 x26mins LE  LAQ 3# 2x12 HS curls red 2x10 Hip abd 2x12 green Calf stretch 20s x2   05/17/24 STW to left knee and quad Jt mobs including patella Jt capsule stretch PROM flex and ext Bike L 4 Green tband HS 5x pain so decreased to red still pain so stopped STW to left lateral knee- very tender   05/13/24 STW to left knee, quad and incision with joint mobs PROM flex and ext with end range stretch AROM sitting after 0-120 Bike 6 min seat 12 L 4 Nustep L 5 6 min LE only Leg Press 30# 2 sets 10- seat 7, 6 too hard 3# LAQ 2 sets 10 Green tband HS curl 2 sets 10   05/10/24 ACT  flexion  118 PROM and jt mobs flex and ext Nustep L 4 5 min Bike 5 min full rev 3# LAQ 2 sets 10 3# standing march,HS curl, SL 3 way 10x 6 in step up 10 x Red tband HS curl 2 sets 10 Leg press 30# 2 sets 10  05/06/24 PROM flex and ext HS curl 20# 10 x Knee ext 5# 10 x Bike 5 min full revolutions Nustep L 4 Left foot on dyna disc motions  LAQ 3# 10 x Standing 3# marching, hip abd, ext and HS curl 10 x each    05/03/24 Nustep level 4 x 6 minutes Bike seat 11 x 4 minutes full revolutions HS curls 20# Leg ext 5# Passive stretch Left LE Joint mobs Scar mobs Left foot on dyna disc motions  AROM 4-112 degrees PROM 0-117 degrees TUG 11 seconds without device  04/29/24 Recheck goals for 10th visit  NuStep L5 x20mins LE only  Bike seat 11 x37mins for ROM Leg ext 5# 2x10 HS curls 20# 2x10 STS 2x10 Leg  press 20# 2x10 Step ups 4   04/26/24 PROM, stretching and STW Left knee and ankle/foot Nustep L 5 Func stepping multi directional for ROM Looked at gait but pt struggling d/t pain in foot and great toe AROM 0-112   04/22/24 Nustep L 5 LE only HS curl 20# 2 sets 10. 10# 10 x LLE Knee ext 5# 2 sets 10 PROM flex and ext STW to knee ,scr and quad 3# LAQ with focus on TKE 2 sets 10 Green tband HS curl 2 sets 10 Act flexion end of session 110  04/19/24 Nustep L 5 LE only Green tband HS curl 2 sets 10 3# LAQ 2 sets 10 3# standing HS curl 2 sets 10 3# 8 in step tap 2 sets 10 6 inch step up fwd and laterally 10 x each Green tband TKE 2 sets 10 PROM flex and ext AROM after 0-103 Leg Press 30# 2 sets 10 Black bar DF and PF 15 x STS wt ball 10 x Resisted gait fwd,back and side ways 20#   04/14/24 Nustep L 5 LE only LAQ 2# 2 sets 10 HS curls red tband 2 sets 10 ( decreased to red to increase ROM) SAQ 10 x TKE with manual resistance 10 x 6 inch step up fwd and laterally 10 x each PROM flex and ext AROM sitting EOB  2-100 Resisted squats 2 sets 10 green tband Leg press 20 # 2 sets 10    04/11/24 NuStep L5x70mins  LAQ 2# 2x10 HS curls green 2x10 Gentle PROM flexion/ext, patellar mobs AROM 95d STS 2x10 Step ups 6  HEP review  04/07/24 Nustep L 4 LAQ 2 sets 10 HS curl 2 sets 10 Leg press 20# 2 sets 10 Step stretch, step tap and calf stretch on step -HEP review and answered questions. STM to the quad and ITB area Gentle PROM into flexion and ext- tolerated fair if gentle    04/04/24 Bike partial reps x 5 minutes Gait without device x 200 feet LAQ 2x10 SAQ 2x10 STM and scar mobilization to the left knee  Feet on ball K2C, rotation, small bridge and isometric abs Ball b/n knees squeeze STM to the quad and ITB area Gentle PROM into flexion she does not tolerate this well Gait without device 200 feet SBA  04/01/24 Nustep L 4 PROM  flex and ext Pat mobs Gentle STW to LEft thigh LAQ  2 sets 10 Yellow tband HS curl 2 sets 10 Step stretch Step up Calf stretch on step VASO low flat 6 min 03/30/24 Evaluation  Nustep level 3 x 5 minutes Vaso low pressure 34 degrees with elevation   PATIENT EDUCATION:  Education details: POC/HEP Person educated: Patient Education method: Explanation, Facilities Manager, and Handouts Education comprehension: verbalized understanding  HOME EXERCISE PROGRAM: Access Code: [email or web address] URL: [email or web address] Date: 04/11/2024 Prepared by: Almetta Fam  Exercises - Standing Knee Flexion Stretch on Step  - 3- x daily - 7 x weekly - 1 sets - 5-10 reps - 5-10 hold - Heel Raise on Step  - 3 x daily - 7 x weekly - 1 sets - 10 reps - Standing Gastroc Stretch on Step  - 3 x daily - 7 x weekly - 1 sets - 10 reps - Forward Step Touch  - 3 x daily - 7 x weekly - 1 sets - 10 reps - Seated Knee Flexion Slide  - 3 x daily - 7 x weekly - 1 sets - 10 reps - Sit to Stand  - 3 x daily - 7 x weekly - 1 sets - 10 reps  ASSESSMENT:  CLINICAL IMPRESSION: Pt arrived with increased pain and requesting less exercises. She started back up doing her home exercises. Focus on minimal exercise that she could tolerate. Most interventions cause pain in the knee. She also has some pain in the hip that is aggravated if overworked.   OBJECTIVE IMPAIRMENTS: Abnormal gait, cardiopulmonary status limiting activity, decreased activity tolerance, decreased balance, decreased endurance, decreased mobility, difficulty walking, decreased ROM, decreased strength, increased edema, increased muscle spasms, impaired flexibility, impaired vision/preception, improper body mechanics, postural dysfunction, and pain.   REHAB POTENTIAL: Good  CLINICAL DECISION MAKING: Evolving/moderate complexity  EVALUATION COMPLEXITY: Moderate   GOALS: Goals reviewed with patient? Yes  SHORT TERM GOALS: Target date:  04/20/24 Independent with initial HEP Baseline: Goal status: 04/01/24 MET  LONG TERM GOALS: Target date: 06/29/24  Independent with advanced HEP Baseline:  Goal status: progressing10/7/25  and 05/13/24  2.  Independent with RICE Baseline:  Goal status: MET 04/14/24  3.  Decrease pain 50% for better function and quality of life Baseline:  Goal status: progressing 04/14/24, not 50% yet progressing 04/29/24 05/06/24 progressing  05/13/24 40-50% PROGRESSING  4.  Decrease TUG to 12 seconds without device Baseline:  Goal status: 04/22/24 MET, 11 seconds without device 05/03/24  5.  Increase AROM of the left knee to 4-116 degrees flexion Baseline:  Goal status: 04/14/24 2-100 progressing  and 04/22/24, 0-110d 04/29/24 progressing 05/13/24 met  6.  Go up and down steps step over step Baseline:  Goal status: 04/14/24 progressing, progressing 04/29/24 and 05/06/24   PLAN:  PT FREQUENCY: 2x/week  PT DURATION: 12 weeks  PLANNED INTERVENTIONS: 97164- PT Re-evaluation, 97110-Therapeutic exercises, 97530- Therapeutic activity, 97112- Neuromuscular re-education, 97535- Self Care, [id number]- Manual therapy, [phone]- Gait training, [phone]- Electrical stimulation (unattended), 97016- Vasopneumatic device, Patient/Family education, Balance training, Stair training, Taping, Joint mobilization, and Cryotherapy  PLAN FOR NEXT SESSION: continue to progress ROM and strength slowly   Almetta Fam, PT 05/27/2024, 11:36 AM

## 2024-05-31 ENCOUNTER — Ambulatory Visit: Attending: Orthopedic Surgery | Admitting: Physical Therapy

## 2024-05-31 DIAGNOSIS — M25662 Stiffness of left knee, not elsewhere classified: Secondary | ICD-10-CM | POA: Insufficient documentation

## 2024-05-31 DIAGNOSIS — R600 Localized edema: Secondary | ICD-10-CM | POA: Insufficient documentation

## 2024-05-31 DIAGNOSIS — R262 Difficulty in walking, not elsewhere classified: Secondary | ICD-10-CM | POA: Insufficient documentation

## 2024-05-31 DIAGNOSIS — M25562 Pain in left knee: Secondary | ICD-10-CM | POA: Insufficient documentation

## 2024-05-31 NOTE — Therapy (Signed)
 OUTPATIENT PHYSICAL THERAPY LOWER EXTREMITY     Patient Name: Debbie Hayes MRN: [id number] DOB:Aug 28, 1950, 73 y.o., female Today's Date: 05/31/2024  END OF SESSION:  PT End of Session - 05/31/24 0802     Visit Number 18    Date for Recertification  06/29/24    Authorization Type [employer] medicare    PT Start Time 0800    PT Stop Time 0840    PT Time Calculation (min) 40 min              Past Medical History:  Diagnosis Date   Allergy    Asthma    Cancer (HCC)    thyroid  cancer   Cataract    Chronic migraine BOTOX INJECTION EVERY 3 MONTHS   Chronic pain in right shoulder    Constipation    Coronary artery disease    Diabetes mellitus    Disorder of inner ear CAUSES VERTIGO OCCASIONALLY   Fibromyalgia    GERD (gastroesophageal reflux disease)    Hemorrhoid    History of bladder infections    History of thyroid  cancer 2009  S/P TOTAL THYROIDECTOMY AND RADIATION   Hyperlipidemia    Hypertension CARDIOLOGIST- DR BLANCA- WILL REQUEST LATE NOTE   DENIES S & S   Hypothyroidism    Insomnia    Left shoulder pain    Macular degeneration    MRSA infection    nasal treated more than 15 years ago   Normal nuclear stress test 01/25/2009   OA (osteoarthritis) JOINTS   Osteopenia    Osteoporosis    Painful orthopaedic hardware    left foot   Pneumonia    PONV (postoperative nausea and vomiting) SEVERE   Rotator cuff disorder LEFT SHOULDER RTC IMPINGMENT   Sleep apnea    mouth guard, no cpap    Sleep apnea in adult 06/26/2017   Spondylolisthesis of lumbar region    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   Wears glasses    Past Surgical History:  Procedure Laterality Date   BACK SURGERY     Dr Mavis- spondylosis   BILATERAL CARPAL TUNNEL RELEASE  1994   BILATERAL ELBOW SURG.  1999   BILATERAL SALPINGOOPHECTOMY  1993   POST-OP URETER REPAIR 12 DAYS AFTER    CATARACT EXTRACTION, BILATERAL     COLONOSCOPY     EYE SURGERY     FOOT ARTHRODESIS Left  07/10/2016   Procedure: LEFT 2ND TARSAL METATARSAL ARTHRODESIS  GASTROC RECESSION LEFT [employer] MODIFIED MCBRIDE BUIONECTOMY;  Surgeon: Norleen Armor, MD;  Location: [facility];  Service: Orthopedics;  Laterality: Left;   GASTROC RECESSION EXTREMITY Left 07/10/2016   Procedure: GASTROC RECESSION EXTREMITY;  Surgeon: Norleen Armor, MD;  Location: [facility];  Service: Orthopedics;  Laterality: Left;   HARDWARE REMOVAL Left 05/20/2018   Procedure: Left foot removal of deep implants;  Surgeon: Armor Norleen, MD;  Location: [facility];  Service: Orthopedics;  Laterality: Left;    LEFT THUMB JOINT REPLACEMENT  2005   RIGHT DONE IN 2004   OOPHORECTOMY     POLYPECTOMY     RIGHT KNEE ARTHROSCOPY  X2  BEFORE 2011   RIGHT KNEE ARTHROSCOPY/ PARTIAL LATERAL MENISECTOMY/ TRICOMPARTMENT CHONDROPLASTY/ DECOMPRESSION CYST  10/26/2009   RIGHT KNEE CLOSED MANIPULATION  09/11/2010   RIGHT SHOULDER ARTHROSCOPY  2010  &  2004   TIBIA CYST REMOVED AND ORIF LEG FX  1958   TONSILLECTOMY  1968   TOTAL KNEE ARTHROPLASTY  07/16/2010   RIGHT   TOTAL KNEE ARTHROPLASTY Left 03/22/2024   Procedure: ARTHROPLASTY, KNEE, TOTAL;  Surgeon: Josefina Chew, MD;  Location: [facility];  Service: Orthopedics;  Laterality: Left;   TOTAL THYROIDECTOMY  2009   CANCER  (POST-OP BLEED)  AND RADIATION TX   trigger finger repair      07/2023   trigger fingers Right 2013   3rd and 4th fingers   UPPER GASTROINTESTINAL ENDOSCOPY     VAGINAL HYSTERECTOMY  1990   Patient Active Problem List   Diagnosis Date Noted   Primary hypertension 05/12/2024   Pain of right thumb 02/26/2024   Precordial pain 12/23/2023   Primary osteoarthritis of left knee with subchondral insufficiency fractures 09/23/2023   History of arthroplasty of right knee 07/03/2023   Primary osteoarthritis of right shoulder 07/03/2023   Coronary artery disease involving native coronary artery of native heart without angina  pectoris 05/25/2023   Coronary artery calcification 11/06/2022   Statin myopathy 11/06/2022   Frequent UTI 10/29/2022   Wart of hand 09/30/2022   Acute cystitis without hematuria 05/14/2021   Medial epicondylitis, right 03/25/2021   Trigger thumb, right thumb 11/19/2020   Right wrist pain 10/23/2020   Osteoporosis 04/05/2020   Trigger finger, left middle finger 07/27/2019   Chronic neck pain 11/10/2018   Orthopedic hardware present 02/17/2018   Controlled diabetes mellitus type 2 with complications (HCC) 12/15/2017   Intractable chronic migraine without aura and with status migrainosus 12/15/2017   Arthralgia 12/15/2017   Joint pain 12/15/2017   Migraine, transformed 12/15/2017   Chronic migraine without aura, with intractable migraine, so stated, with status migrainosus 12/15/2017   Pain of left shoulder joint on movement 10/19/2017   OSA (obstructive sleep apnea) 06/26/2017   Snoring 03/25/2016   Palpitations 03/25/2016   Headache 03/25/2016   Sensory disorder of trigeminal nerve 11/26/2015   Spondylolisthesis of lumbar region 08/09/2014   Lumbar adjacent segment disease with spondylolisthesis 08/09/2014   Back pain 02/17/2014   Macular degeneration 02/17/2014   Backache 02/17/2014   Other and unspecified hyperlipidemia 10/14/2013   Hyperlipidemia 10/14/2013   Onychomycosis 12/01/2012   Arthritis 04/12/2012   Hypothyroidism 04/12/2012    PCP: Watt, MD  REFERRING PROVIDER: Josefina, MD  REFERRING DIAG: s/p Left TKA  THERAPY DIAG:  Stiffness of left knee, not elsewhere classified  Difficulty in walking, not elsewhere classified  Acute pain of left knee  Rationale for Evaluation and Treatment: Rehabilitation  ONSET DATE: 03/22/24  SUBJECTIVE:   SUBJECTIVE STATEMENT:  knee is the same, not any better. ITB bad. If leg is not elevated 5-7/10   PAIN:  Are you having pain? Left knee 7/10  PRECAUTIONS: None  RED FLAGS: None   WEIGHT BEARING RESTRICTIONS:  No  FALLS:  Has patient fallen in last 6 months? No  LIVING ENVIRONMENT: Lives with: lives with their family Lives in: House/apartment Stairs: Yes: Internal: 2 steps; can reach both Has following equipment at home: Single point cane, Walker - 2 wheeled, Crutches, and shower chair  OCCUPATION: retired  PLOF: Independent and shopping and housework  PATIENT GOALS: have normal walk without device, less pain  NEXT MD VISIT: 04/06/24  OBJECTIVE:  Note: Objective measures were completed at Evaluation unless otherwise noted.   COGNITION: Overall cognitive status: Within functional limits for tasks assessed     SENSATION: WFL  EDEMA:  Circumferential: mid patella 47 cm on the left, 40 cm on the right  MUSCLE LENGTH: Tight calves and HS  POSTURE: rounded  shoulders and forward head  PALPATION: Tender, ecchymosis left thigh and knee, inner and posterior  LOWER EXTREMITY ROM:  Active ROM AROM LEft eval PROM Left eval LLE 04/01/24 AROM  LLE 04/04/24 04/26/24 04/29/24 05/03/24 AROM/PROM  Hip flexion         Hip extension         Hip abduction         Hip adduction         Hip internal rotation         Hip external rotation         Knee flexion 65 70 82 85 112 110 112/117  Knee extension 15 9 5 5  0 0 4/0  Ankle dorsiflexion         Ankle plantarflexion         Ankle inversion         Ankle eversion          (Blank rows = not tested)  LOWER EXTREMITY MMT:  MMT Right eval Left eval  Hip flexion    Hip extension    Hip abduction    Hip adduction    Hip internal rotation    Hip external rotation    Knee flexion  2  Knee extension  2  Ankle dorsiflexion    Ankle plantarflexion    Ankle inversion    Ankle eversion     (Blank rows = not tested)  LOWER EXTREMITY SPECIAL TESTS:  Knee special tests: quad lag, ballotable patella  FUNCTIONAL TESTS:  Timed up and go (TUG): 17 seconds  GAIT: Distance walked: 60 feet Assistive device utilized: Environmental Consultant - 2  wheeled Level of assistance: Modified independence Comments: mild antalgic gait                                                                                                                                TREATMENT DATE:   05/31/24 Belt mobs to left knee to increase capsular stretch Belt mobs for flexion PROM flex and ext STW to left quad and ITB ITB stretch in SL - limited tolerance lying on RT hip Nustep L 4 6 min Left knee MMT 4+ Left Knee AROM sitting 2-117 Green tband HS curl 2 sets 10 3# LAQ 2 sets 10 6 inch step up fwd and laterally 10 x each Calf stretch Resisted gait laterally 20 # Worked on gait to increase stride , knee flexion in swing phase and decrease circumduction that will increase ITB pull  05/27/24 Bike partial revs x5 mins Calf stretch Resisted gait 20# 4 way x4 Step ups 6 STS 2x10 Bridges 2x5 Sidelying hip abduction 2x5 ROM- 117d  05/23/24 PROM to L knee  ROM- 115d flexion ITB stretch 30s x2 HS stretch 30s x2 SLR x10, then with abd x10  NuStep L5 x44mins LE  LAQ 3# 2x12 HS curls red 2x10 Hip abd 2x12 green Calf stretch 20s x2   05/17/24 STW to  left knee and quad Jt mobs including patella Jt capsule stretch PROM flex and ext Bike L 4 Green tband HS 5x pain so decreased to red still pain so stopped STW to left lateral knee- very tender   05/13/24 STW to left knee, quad and incision with joint mobs PROM flex and ext with end range stretch AROM sitting after 0-120 Bike 6 min seat 12 L 4 Nustep L 5 6 min LE only Leg Press 30# 2 sets 10- seat 7, 6 too hard 3# LAQ 2 sets 10 Green tband HS curl 2 sets 10   05/10/24 ACT flexion  118 PROM and jt mobs flex and ext Nustep L 4 5 min Bike 5 min full rev 3# LAQ 2 sets 10 3# standing march,HS curl, SL 3 way 10x 6 in step up 10 x Red tband HS curl 2 sets 10 Leg press 30# 2 sets 10  05/06/24 PROM flex and ext HS curl 20# 10 x Knee ext 5# 10 x Bike 5 min full  revolutions Nustep L 4 Left foot on dyna disc motions  LAQ 3# 10 x Standing 3# marching, hip abd, ext and HS curl 10 x each    05/03/24 Nustep level 4 x 6 minutes Bike seat 11 x 4 minutes full revolutions HS curls 20# Leg ext 5# Passive stretch Left LE Joint mobs Scar mobs Left foot on dyna disc motions  AROM 4-112 degrees PROM 0-117 degrees TUG 11 seconds without device  04/29/24 Recheck goals for 10th visit  NuStep L5 x11mins LE only  Bike seat 11 x65mins for ROM Leg ext 5# 2x10 HS curls 20# 2x10 STS 2x10 Leg press 20# 2x10 Step ups 4   04/26/24 PROM, stretching and STW Left knee and ankle/foot Nustep L 5 Func stepping multi directional for ROM Looked at gait but pt struggling d/t pain in foot and great toe AROM 0-112   04/22/24 Nustep L 5 LE only HS curl 20# 2 sets 10. 10# 10 x LLE Knee ext 5# 2 sets 10 PROM flex and ext STW to knee ,scr and quad 3# LAQ with focus on TKE 2 sets 10 Green tband HS curl 2 sets 10 Act flexion end of session 110  04/19/24 Nustep L 5 LE only Green tband HS curl 2 sets 10 3# LAQ 2 sets 10 3# standing HS curl 2 sets 10 3# 8 in step tap 2 sets 10 6 inch step up fwd and laterally 10 x each Green tband TKE 2 sets 10 PROM flex and ext AROM after 0-103 Leg Press 30# 2 sets 10 Black bar DF and PF 15 x STS wt ball 10 x Resisted gait fwd,back and side ways 20#   04/14/24 Nustep L 5 LE only LAQ 2# 2 sets 10 HS curls red tband 2 sets 10 ( decreased to red to increase ROM) SAQ 10 x TKE with manual resistance 10 x 6 inch step up fwd and laterally 10 x each PROM flex and ext AROM sitting EOB  2-100 Resisted squats 2 sets 10 green tband Leg press 20 # 2 sets 10    04/11/24 NuStep L5x4mins  LAQ 2# 2x10 HS curls green 2x10 Gentle PROM flexion/ext, patellar mobs AROM 95d STS 2x10 Step ups 6  HEP review  04/07/24 Nustep L 4 LAQ 2 sets 10 HS curl 2 sets 10 Leg press 20# 2 sets 10 Step  stretch, step tap and calf stretch on step -HEP  review and answered questions. STM to the quad and ITB area Gentle PROM into flexion and ext- tolerated fair if gentle    04/04/24 Bike partial reps x 5 minutes Gait without device x 200 feet LAQ 2x10 SAQ 2x10 STM and scar mobilization to the left knee  Feet on ball K2C, rotation, small bridge and isometric abs Ball b/n knees squeeze STM to the quad and ITB area Gentle PROM into flexion she does not tolerate this well Gait without device 200 feet SBA  04/01/24 Nustep L 4 PROM flex and ext Pat mobs Gentle STW to LEft thigh LAQ 2 sets 10 Yellow tband HS curl 2 sets 10 Step stretch Step up Calf stretch on step VASO low flat 6 min 03/30/24 Evaluation  Nustep level 3 x 5 minutes Vaso low pressure 34 degrees with elevation   PATIENT EDUCATION:  Education details: POC/HEP Person educated: Patient Education method: Explanation, Facilities Manager, and Handouts Education comprehension: verbalized understanding  HOME EXERCISE PROGRAM: Access Code: [email or web address] URL: [email or web address] Date: 04/11/2024 Prepared by: Almetta Fam  Exercises - Standing Knee Flexion Stretch on Step  - 3- x daily - 7 x weekly - 1 sets - 5-10 reps - 5-10 hold - Heel Raise on Step  - 3 x daily - 7 x weekly - 1 sets - 10 reps - Standing Gastroc Stretch on Step  - 3 x daily - 7 x weekly - 1 sets - 10 reps - Forward Step Touch  - 3 x daily - 7 x weekly - 1 sets - 10 reps - Seated Knee Flexion Slide  - 3 x daily - 7 x weekly - 1 sets - 10 reps - Sit to Stand  - 3 x daily - 7 x weekly - 1 sets - 10 reps  ASSESSMENT:  CLINICAL IMPRESSION:  knee is the same, not any better. ITB bad. If leg is not elevated 5-7/10 limited tolerance to STW and stretching of IT. ROM and MMT very good but limited by pain. Goals assessed and documented.   OBJECTIVE IMPAIRMENTS: Abnormal gait, cardiopulmonary status limiting activity, decreased activity tolerance,  decreased balance, decreased endurance, decreased mobility, difficulty walking, decreased ROM, decreased strength, increased edema, increased muscle spasms, impaired flexibility, impaired vision/preception, improper body mechanics, postural dysfunction, and pain.   REHAB POTENTIAL: Good  CLINICAL DECISION MAKING: Evolving/moderate complexity  EVALUATION COMPLEXITY: Moderate   GOALS: Goals reviewed with patient? Yes  SHORT TERM GOALS: Target date: 04/20/24 Independent with initial HEP Baseline: Goal status: 04/01/24 MET  LONG TERM GOALS: Target date: 06/29/24  Independent with advanced HEP Baseline:  Goal status: progressing10/7/25  and 05/13/24  2.  Independent with RICE Baseline:  Goal status: MET 04/14/24  3.  Decrease pain 50% for better function and quality of life Baseline:  Goal status: progressing 04/14/24, not 50% yet progressing 04/29/24 05/06/24 progressing  05/13/24 40-50% PROGRESSING 05/31/24 progressing  4.  Decrease TUG to 12 seconds without device Baseline:  Goal status: 04/22/24 MET, 11 seconds without device 05/03/24  5.  Increase AROM of the left knee to 4-116 degrees flexion Baseline:  Goal status: 04/14/24 2-100 progressing  and 04/22/24, 0-110d 04/29/24 progressing 05/13/24 met  6.  Go up and down steps step over step Baseline:  Goal status: 04/14/24 progressing, progressing 04/29/24 and 05/06/24  05/31/24 reverted back since ITB flare up   PLAN:  PT FREQUENCY: 2x/week  PT DURATION: 12 weeks  PLANNED INTERVENTIONS: 97164- PT Re-evaluation, 97110-Therapeutic exercises, 97530- Therapeutic activity, 97112- Neuromuscular re-education, 97535- Self Care,  02859- Manual therapy, [id number]- Gait training, [phone]- Electrical stimulation (unattended), [phone]- Vasopneumatic device, Patient/Family education, Balance training, Stair training, Taping, Joint mobilization, and Cryotherapy  PLAN FOR NEXT SESSION: continue to progress ROM and strength slowly. MD  06/01/24   Seaver Machia,ANGIE, PTA 05/31/2024, 8:03 AM

## 2024-06-03 ENCOUNTER — Ambulatory Visit: Admitting: Physical Therapy

## 2024-06-03 DIAGNOSIS — R262 Difficulty in walking, not elsewhere classified: Secondary | ICD-10-CM

## 2024-06-03 DIAGNOSIS — M25662 Stiffness of left knee, not elsewhere classified: Secondary | ICD-10-CM | POA: Diagnosis not present

## 2024-06-03 DIAGNOSIS — M25562 Pain in left knee: Secondary | ICD-10-CM

## 2024-06-03 NOTE — Therapy (Signed)
 OUTPATIENT PHYSICAL THERAPY LOWER EXTREMITY     Patient Name: DORENE BRUNI MRN: [id number] DOB:[birth year], 73 y.o., female Today's Date: 06/03/2024  END OF SESSION:  PT End of Session - 06/03/24 1012     Visit Number 19    Date for Recertification  06/29/24    Authorization Type [employer] medicare    PT Start Time 1013    PT Stop Time 1055    PT Time Calculation (min) 42 min              Past Medical History:  Diagnosis Date   Allergy    Asthma    Cancer (HCC)    thyroid  cancer   Cataract    Chronic migraine BOTOX INJECTION EVERY 3 MONTHS   Chronic pain in right shoulder    Constipation    Coronary artery disease    Diabetes mellitus    Disorder of inner ear CAUSES VERTIGO OCCASIONALLY   Fibromyalgia    GERD (gastroesophageal reflux disease)    Hemorrhoid    History of bladder infections    History of thyroid  cancer 2009  S/P TOTAL THYROIDECTOMY AND RADIATION   Hyperlipidemia    Hypertension CARDIOLOGIST- DR BLANCA- WILL REQUEST LATE NOTE   DENIES S & S   Hypothyroidism    Insomnia    Left shoulder pain    Macular degeneration    MRSA infection    nasal treated more than 15 years ago   Normal nuclear stress test 01/25/2009   OA (osteoarthritis) JOINTS   Osteopenia    Osteoporosis    Painful orthopaedic hardware    left foot   Pneumonia    PONV (postoperative nausea and vomiting) SEVERE   Rotator cuff disorder LEFT SHOULDER RTC IMPINGMENT   Sleep apnea    mouth guard, no cpap    Sleep apnea in adult 06/26/2017   Spondylolisthesis of lumbar region    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   Wears glasses    Past Surgical History:  Procedure Laterality Date   BACK SURGERY     Dr Mavis- spondylosis   BILATERAL CARPAL TUNNEL RELEASE  1994   BILATERAL ELBOW SURG.  1999   BILATERAL SALPINGOOPHECTOMY  1993   POST-OP URETER REPAIR 12 DAYS AFTER    CATARACT EXTRACTION, BILATERAL     COLONOSCOPY     EYE SURGERY     FOOT ARTHRODESIS Left  07/10/2016   Procedure: LEFT 2ND TARSAL METATARSAL ARTHRODESIS  GASTROC RECESSION LEFT [employer] MODIFIED MCBRIDE BUIONECTOMY;  Surgeon: Norleen Armor, MD;  Location: [facility];  Service: Orthopedics;  Laterality: Left;   GASTROC RECESSION EXTREMITY Left 07/10/2016   Procedure: GASTROC RECESSION EXTREMITY;  Surgeon: Norleen Armor, MD;  Location: [facility];  Service: Orthopedics;  Laterality: Left;   HARDWARE REMOVAL Left 05/20/2018   Procedure: Left foot removal of deep implants;  Surgeon: Armor Norleen, MD;  Location: [facility];  Service: Orthopedics;  Laterality: Left;    LEFT THUMB JOINT REPLACEMENT  2005   RIGHT DONE IN 2004   OOPHORECTOMY     POLYPECTOMY     RIGHT KNEE ARTHROSCOPY  X2  BEFORE 2011   RIGHT KNEE ARTHROSCOPY/ PARTIAL LATERAL MENISECTOMY/ TRICOMPARTMENT CHONDROPLASTY/ DECOMPRESSION CYST  10/26/2009   RIGHT KNEE CLOSED MANIPULATION  09/11/2010   RIGHT SHOULDER ARTHROSCOPY  2010  &  2004   TIBIA CYST REMOVED AND ORIF LEG FX  1958   TONSILLECTOMY  1968   TOTAL KNEE ARTHROPLASTY  07/16/2010   RIGHT   TOTAL KNEE ARTHROPLASTY Left 03/22/2024   Procedure: ARTHROPLASTY, KNEE, TOTAL;  Surgeon: Josefina Chew, MD;  Location: [facility];  Service: Orthopedics;  Laterality: Left;   TOTAL THYROIDECTOMY  2009   CANCER  (POST-OP BLEED)  AND RADIATION TX   trigger finger repair      07/2023   trigger fingers Right 2013   3rd and 4th fingers   UPPER GASTROINTESTINAL ENDOSCOPY     VAGINAL HYSTERECTOMY  1990   Patient Active Problem List   Diagnosis Date Noted   Primary hypertension 05/12/2024   Pain of right thumb 02/26/2024   Precordial pain 12/23/2023   Primary osteoarthritis of left knee with subchondral insufficiency fractures 09/23/2023   History of arthroplasty of right knee 07/03/2023   Primary osteoarthritis of right shoulder 07/03/2023   Coronary artery disease involving native coronary artery of native heart without angina  pectoris 05/25/2023   Coronary artery calcification 11/06/2022   Statin myopathy 11/06/2022   Frequent UTI 10/29/2022   Wart of hand 09/30/2022   Acute cystitis without hematuria 05/14/2021   Medial epicondylitis, right 03/25/2021   Trigger thumb, right thumb 11/19/2020   Right wrist pain 10/23/2020   Osteoporosis 04/05/2020   Trigger finger, left middle finger 07/27/2019   Chronic neck pain 11/10/2018   Orthopedic hardware present 02/17/2018   Controlled diabetes mellitus type 2 with complications (HCC) 12/15/2017   Intractable chronic migraine without aura and with status migrainosus 12/15/2017   Arthralgia 12/15/2017   Joint pain 12/15/2017   Migraine, transformed 12/15/2017   Chronic migraine without aura, with intractable migraine, so stated, with status migrainosus 12/15/2017   Pain of left shoulder joint on movement 10/19/2017   OSA (obstructive sleep apnea) 06/26/2017   Snoring 03/25/2016   Palpitations 03/25/2016   Headache 03/25/2016   Sensory disorder of trigeminal nerve 11/26/2015   Spondylolisthesis of lumbar region 08/09/2014   Lumbar adjacent segment disease with spondylolisthesis 08/09/2014   Back pain 02/17/2014   Macular degeneration 02/17/2014   Backache 02/17/2014   Other and unspecified hyperlipidemia 10/14/2013   Hyperlipidemia 10/14/2013   Onychomycosis 12/01/2012   Arthritis 04/12/2012   Hypothyroidism 04/12/2012    PCP: Watt, MD  REFERRING PROVIDER: Josefina, MD  REFERRING DIAG: s/p Left TKA  THERAPY DIAG:  Stiffness of left knee, not elsewhere classified  Difficulty in walking, not elsewhere classified  Acute pain of left knee  Rationale for Evaluation and Treatment: Rehabilitation  ONSET DATE: 03/22/24  SUBJECTIVE:   SUBJECTIVE STATEMENT:  saw MD and he was pleased func, not concerned with pain. Upping gabapentin . I would to stick with PT with modified ex  PAIN:  Are you having pain? Left knee 6/10  PRECAUTIONS: None  RED  FLAGS: None   WEIGHT BEARING RESTRICTIONS: No  FALLS:  Has patient fallen in last 6 months? No  LIVING ENVIRONMENT: Lives with: lives with their family Lives in: House/apartment Stairs: Yes: Internal: 2 steps; can reach both Has following equipment at home: Single point cane, Walker - 2 wheeled, Crutches, and shower chair  OCCUPATION: retired  PLOF: Independent and shopping and housework  PATIENT GOALS: have normal walk without device, less pain  NEXT MD VISIT: 04/06/24  OBJECTIVE:  Note: Objective measures were completed at Evaluation unless otherwise noted.   COGNITION: Overall cognitive status: Within functional limits for tasks assessed     SENSATION: WFL  EDEMA:  Circumferential: mid patella 47 cm on the left, 40 cm on the right  MUSCLE LENGTH: Tight  calves and HS  POSTURE: rounded shoulders and forward head  PALPATION: Tender, ecchymosis left thigh and knee, inner and posterior  LOWER EXTREMITY ROM:  Active ROM AROM LEft eval PROM Left eval LLE 04/01/24 AROM  LLE 04/04/24 04/26/24 04/29/24 05/03/24 AROM/PROM  Hip flexion         Hip extension         Hip abduction         Hip adduction         Hip internal rotation         Hip external rotation         Knee flexion 65 70 82 85 112 110 112/117  Knee extension 15 9 5 5  0 0 4/0  Ankle dorsiflexion         Ankle plantarflexion         Ankle inversion         Ankle eversion          (Blank rows = not tested)  LOWER EXTREMITY MMT:  MMT Right eval Left eval  Hip flexion    Hip extension    Hip abduction    Hip adduction    Hip internal rotation    Hip external rotation    Knee flexion  2  Knee extension  2  Ankle dorsiflexion    Ankle plantarflexion    Ankle inversion    Ankle eversion     (Blank rows = not tested)  LOWER EXTREMITY SPECIAL TESTS:  Knee special tests: quad lag, ballotable patella  FUNCTIONAL TESTS:  Timed up and go (TUG): 17 seconds  GAIT: Distance walked: 60  feet Assistive device utilized: Walker - 2 wheeled Level of assistance: Modified independence Comments: mild antalgic gait                                                                                                                                TREATMENT DATE:   06/03/24 AROM SOC 110 LAQ 3 # 2 sets 10 Standing HS curl 3# 2 sets 10 Standing 3# SL hip flex,ext and abd 10 x  Nustep L 5 PROM flex and ext STW to left quad and ITB Resisted gait 20# 4 ways 5 x each AROM end of session  117    05/31/24 Belt mobs to left knee to increase capsular stretch Belt mobs for flexion PROM flex and ext STW to left quad and ITB ITB stretch in SL - limited tolerance lying on RT hip Nustep L 4 6 min Left knee MMT 4+ Left Knee AROM sitting 2-117 Green tband HS curl 2 sets 10 3# LAQ 2 sets 10 6 inch step up fwd and laterally 10 x each Calf stretch Resisted gait laterally 20 # Worked on gait to increase stride , knee flexion in swing phase and decrease circumduction that will increase ITB pull  05/27/24 Bike partial revs x5 mins Calf stretch Resisted gait 20# 4 way  x4 Step ups 6 STS 2x10 Bridges 2x5 Sidelying hip abduction 2x5 ROM- 117d  05/23/24 PROM to L knee  ROM- 115d flexion ITB stretch 30s x2 HS stretch 30s x2 SLR x10, then with abd x10  NuStep L5 x4mins LE  LAQ 3# 2x12 HS curls red 2x10 Hip abd 2x12 green Calf stretch 20s x2   05/17/24 STW to left knee and quad Jt mobs including patella Jt capsule stretch PROM flex and ext Bike L 4 Green tband HS 5x pain so decreased to red still pain so stopped STW to left lateral knee- very tender   05/13/24 STW to left knee, quad and incision with joint mobs PROM flex and ext with end range stretch AROM sitting after 0-120 Bike 6 min seat 12 L 4 Nustep L 5 6 min LE only Leg Press 30# 2 sets 10- seat 7, 6 too hard 3# LAQ 2 sets 10 Green tband HS curl 2 sets 10   05/10/24 ACT flexion  118 PROM and jt  mobs flex and ext Nustep L 4 5 min Bike 5 min full rev 3# LAQ 2 sets 10 3# standing march,HS curl, SL 3 way 10x 6 in step up 10 x Red tband HS curl 2 sets 10 Leg press 30# 2 sets 10  05/06/24 PROM flex and ext HS curl 20# 10 x Knee ext 5# 10 x Bike 5 min full revolutions Nustep L 4 Left foot on dyna disc motions  LAQ 3# 10 x Standing 3# marching, hip abd, ext and HS curl 10 x each    05/03/24 Nustep level 4 x 6 minutes Bike seat 11 x 4 minutes full revolutions HS curls 20# Leg ext 5# Passive stretch Left LE Joint mobs Scar mobs Left foot on dyna disc motions  AROM 4-112 degrees PROM 0-117 degrees TUG 11 seconds without device  04/29/24 Recheck goals for 10th visit  NuStep L5 x56mins LE only  Bike seat 11 x56mins for ROM Leg ext 5# 2x10 HS curls 20# 2x10 STS 2x10 Leg press 20# 2x10 Step ups 4   04/26/24 PROM, stretching and STW Left knee and ankle/foot Nustep L 5 Func stepping multi directional for ROM Looked at gait but pt struggling d/t pain in foot and great toe AROM 0-112   04/22/24 Nustep L 5 LE only HS curl 20# 2 sets 10. 10# 10 x LLE Knee ext 5# 2 sets 10 PROM flex and ext STW to knee ,scr and quad 3# LAQ with focus on TKE 2 sets 10 Green tband HS curl 2 sets 10 Act flexion end of session 110  04/19/24 Nustep L 5 LE only Green tband HS curl 2 sets 10 3# LAQ 2 sets 10 3# standing HS curl 2 sets 10 3# 8 in step tap 2 sets 10 6 inch step up fwd and laterally 10 x each Green tband TKE 2 sets 10 PROM flex and ext AROM after 0-103 Leg Press 30# 2 sets 10 Black bar DF and PF 15 x STS wt ball 10 x Resisted gait fwd,back and side ways 20#   04/14/24 Nustep L 5 LE only LAQ 2# 2 sets 10 HS curls red tband 2 sets 10 ( decreased to red to increase ROM) SAQ 10 x TKE with manual resistance 10 x 6 inch step up fwd and laterally 10 x each PROM flex and ext AROM sitting EOB  2-100 Resisted squats 2 sets 10 green tband Leg  press 20 #  2 sets 10    04/11/24 NuStep L5x42mins  LAQ 2# 2x10 HS curls green 2x10 Gentle PROM flexion/ext, patellar mobs AROM 95d STS 2x10 Step ups 6  HEP review  04/07/24 Nustep L 4 LAQ 2 sets 10 HS curl 2 sets 10 Leg press 20# 2 sets 10 Step stretch, step tap and calf stretch on step -HEP review and answered questions. STM to the quad and ITB area Gentle PROM into flexion and ext- tolerated fair if gentle    04/04/24 Bike partial reps x 5 minutes Gait without device x 200 feet LAQ 2x10 SAQ 2x10 STM and scar mobilization to the left knee  Feet on ball K2C, rotation, small bridge and isometric abs Ball b/n knees squeeze STM to the quad and ITB area Gentle PROM into flexion she does not tolerate this well Gait without device 200 feet SBA  04/01/24 Nustep L 4 PROM flex and ext Pat mobs Gentle STW to LEft thigh LAQ 2 sets 10 Yellow tband HS curl 2 sets 10 Step stretch Step up Calf stretch on step VASO low flat 6 min 03/30/24 Evaluation  Nustep level 3 x 5 minutes Vaso low pressure 34 degrees with elevation   PATIENT EDUCATION:  Education details: POC/HEP Person educated: Patient Education method: Explanation, Facilities Manager, and Handouts Education comprehension: verbalized understanding  HOME EXERCISE PROGRAM: Access Code: [email or web address] URL: [email or web address] Date: 04/11/2024 Prepared by: Almetta Fam  Exercises - Standing Knee Flexion Stretch on Step  - 3- x daily - 7 x weekly - 1 sets - 5-10 reps - 5-10 hold - Heel Raise on Step  - 3 x daily - 7 x weekly - 1 sets - 10 reps - Standing Gastroc Stretch on Step  - 3 x daily - 7 x weekly - 1 sets - 10 reps - Forward Step Touch  - 3 x daily - 7 x weekly - 1 sets - 10 reps - Seated Knee Flexion Slide  - 3 x daily - 7 x weekly - 1 sets - 10 reps - Sit to Stand  - 3 x daily - 7 x weekly - 1 sets - 10 reps  ASSESSMENT:  CLINICAL IMPRESSION:  knee is the same, not any better. MD is very  pleased with ROM and func. Increasing gabapentin  for nerve pain. Pt wants to continue therapy with slow progression to not cause increased pain so we taylor to tolerance OBJECTIVE IMPAIRMENTS: Abnormal gait, cardiopulmonary status limiting activity, decreased activity tolerance, decreased balance, decreased endurance, decreased mobility, difficulty walking, decreased ROM, decreased strength, increased edema, increased muscle spasms, impaired flexibility, impaired vision/preception, improper body mechanics, postural dysfunction, and pain.   REHAB POTENTIAL: Good  CLINICAL DECISION MAKING: Evolving/moderate complexity  EVALUATION COMPLEXITY: Moderate   GOALS: Goals reviewed with patient? Yes  SHORT TERM GOALS: Target date: 04/20/24 Independent with initial HEP Baseline: Goal status: 04/01/24 MET  LONG TERM GOALS: Target date: 06/29/24  Independent with advanced HEP Baseline:  Goal status: progressing10/7/25  and 05/13/24  2.  Independent with RICE Baseline:  Goal status: MET 04/14/24  3.  Decrease pain 50% for better function and quality of life Baseline:  Goal status: progressing 04/14/24, not 50% yet progressing 04/29/24 05/06/24 progressing  05/13/24 40-50% PROGRESSING 05/31/24 progressing    4.  Decrease TUG to 12 seconds without device Baseline:  Goal status: 04/22/24 MET, 11 seconds without device 05/03/24  5.  Increase AROM of the left knee to 4-116 degrees flexion Baseline:  Goal status: 04/14/24 2-100 progressing  and 04/22/24, 0-110d 04/29/24 progressing 05/13/24 met  6.  Go up and down steps step over step Baseline:  Goal status: 04/14/24 progressing, progressing 04/29/24 and 05/06/24  05/31/24 reverted back since ITB flare up   PLAN:  PT FREQUENCY: 2x/week  PT DURATION: 12 weeks  PLANNED INTERVENTIONS: 97164- PT Re-evaluation, 97110-Therapeutic exercises, 97530- Therapeutic activity, 97112- Neuromuscular re-education, 97535- Self Care, [id number]- Manual therapy, [phone]-  Gait training, [phone]- Electrical stimulation (unattended), 97016- Vasopneumatic device, Patient/Family education, Balance training, Stair training, Taping, Joint mobilization, and Cryotherapy  PLAN FOR NEXT SESSION: continue to progress ROM and strength slowly.    Kimberlin Scheel,ANGIE, PTA 06/03/2024, 10:12 AM

## 2024-06-08 ENCOUNTER — Other Ambulatory Visit: Payer: Self-pay | Admitting: Family Medicine

## 2024-06-09 ENCOUNTER — Ambulatory Visit

## 2024-06-09 DIAGNOSIS — R262 Difficulty in walking, not elsewhere classified: Secondary | ICD-10-CM

## 2024-06-09 DIAGNOSIS — M25662 Stiffness of left knee, not elsewhere classified: Secondary | ICD-10-CM | POA: Diagnosis not present

## 2024-06-09 DIAGNOSIS — R600 Localized edema: Secondary | ICD-10-CM

## 2024-06-09 DIAGNOSIS — M25562 Pain in left knee: Secondary | ICD-10-CM

## 2024-06-09 NOTE — Therapy (Signed)
 OUTPATIENT PHYSICAL THERAPY LOWER EXTREMITY    Progress Note Reporting Period 04/29/24 to 06/09/24  See note below for Objective Data and Assessment of Progress/Goals.     Patient Name: Debbie Hayes MRN: [id number] DOB:1950/08/19, 73 y.o., female Today's Date: 06/09/2024  END OF SESSION:  PT End of Session - 06/09/24 1228     Visit Number 20    Date for Recertification  06/29/24    Authorization Type [employer] medicare    PT Start Time 1230    PT Stop Time 1315    PT Time Calculation (min) 45 min               Past Medical History:  Diagnosis Date   Allergy    Asthma    Cancer (HCC)    thyroid  cancer   Cataract    Chronic migraine BOTOX INJECTION EVERY 3 MONTHS   Chronic pain in right shoulder    Constipation    Coronary artery disease    Diabetes mellitus    Disorder of inner ear CAUSES VERTIGO OCCASIONALLY   Fibromyalgia    GERD (gastroesophageal reflux disease)    Hemorrhoid    History of bladder infections    History of thyroid  cancer 2009  S/P TOTAL THYROIDECTOMY AND RADIATION   Hyperlipidemia    Hypertension CARDIOLOGIST- DR BLANCA- WILL REQUEST LATE NOTE   DENIES S & S   Hypothyroidism    Insomnia    Left shoulder pain    Macular degeneration    MRSA infection    nasal treated more than 15 years ago   Normal nuclear stress test 01/25/2009   OA (osteoarthritis) JOINTS   Osteopenia    Osteoporosis    Painful orthopaedic hardware    left foot   Pneumonia    PONV (postoperative nausea and vomiting) SEVERE   Rotator cuff disorder LEFT SHOULDER RTC IMPINGMENT   Sleep apnea    mouth guard, no cpap    Sleep apnea in adult 06/26/2017   Spondylolisthesis of lumbar region    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   Wears glasses    Past Surgical History:  Procedure Laterality Date   BACK SURGERY     Dr Mavis- spondylosis   BILATERAL CARPAL TUNNEL RELEASE  1994   BILATERAL ELBOW SURG.  1999   BILATERAL SALPINGOOPHECTOMY  1993   POST-OP  URETER REPAIR 12 DAYS AFTER    CATARACT EXTRACTION, BILATERAL     COLONOSCOPY     EYE SURGERY     FOOT ARTHRODESIS Left 07/10/2016   Procedure: LEFT 2ND TARSAL METATARSAL ARTHRODESIS  GASTROC RECESSION LEFT [employer] MODIFIED MCBRIDE BUIONECTOMY;  Surgeon: Norleen Armor, MD;  Location: [facility];  Service: Orthopedics;  Laterality: Left;   GASTROC RECESSION EXTREMITY Left 07/10/2016   Procedure: GASTROC RECESSION EXTREMITY;  Surgeon: Norleen Armor, MD;  Location: [facility];  Service: Orthopedics;  Laterality: Left;   HARDWARE REMOVAL Left 05/20/2018   Procedure: Left foot removal of deep implants;  Surgeon: Armor Norleen, MD;  Location: [facility];  Service: Orthopedics;  Laterality: Left;    LEFT THUMB JOINT REPLACEMENT  2005   RIGHT DONE IN 2004   OOPHORECTOMY     POLYPECTOMY     RIGHT KNEE ARTHROSCOPY  X2  BEFORE 2011   RIGHT KNEE ARTHROSCOPY/ PARTIAL LATERAL MENISECTOMY/ TRICOMPARTMENT CHONDROPLASTY/ DECOMPRESSION CYST  10/26/2009   RIGHT KNEE CLOSED MANIPULATION  09/11/2010   RIGHT SHOULDER ARTHROSCOPY  2010  &  2004  TIBIA CYST REMOVED AND ORIF LEG FX  1958   TONSILLECTOMY  1968   TOTAL KNEE ARTHROPLASTY  07/16/2010   RIGHT   TOTAL KNEE ARTHROPLASTY Left 03/22/2024   Procedure: ARTHROPLASTY, KNEE, TOTAL;  Surgeon: Josefina Chew, MD;  Location: [facility];  Service: Orthopedics;  Laterality: Left;   TOTAL THYROIDECTOMY  2009   CANCER  (POST-OP BLEED)  AND RADIATION TX   trigger finger repair      07/2023   trigger fingers Right 2013   3rd and 4th fingers   UPPER GASTROINTESTINAL ENDOSCOPY     VAGINAL HYSTERECTOMY  1990   Patient Active Problem List   Diagnosis Date Noted   Primary hypertension 05/12/2024   Pain of right thumb 02/26/2024   Precordial pain 12/23/2023   Primary osteoarthritis of left knee with subchondral insufficiency fractures 09/23/2023   History of arthroplasty of right knee 07/03/2023   Primary osteoarthritis of  right shoulder 07/03/2023   Coronary artery disease involving native coronary artery of native heart without angina pectoris 05/25/2023   Coronary artery calcification 11/06/2022   Statin myopathy 11/06/2022   Frequent UTI 10/29/2022   Wart of hand 09/30/2022   Acute cystitis without hematuria 05/14/2021   Medial epicondylitis, right 03/25/2021   Trigger thumb, right thumb 11/19/2020   Right wrist pain 10/23/2020   Osteoporosis 04/05/2020   Trigger finger, left middle finger 07/27/2019   Chronic neck pain 11/10/2018   Orthopedic hardware present 02/17/2018   Controlled diabetes mellitus type 2 with complications (HCC) 12/15/2017   Intractable chronic migraine without aura and with status migrainosus 12/15/2017   Arthralgia 12/15/2017   Joint pain 12/15/2017   Migraine, transformed 12/15/2017   Chronic migraine without aura, with intractable migraine, so stated, with status migrainosus 12/15/2017   Pain of left shoulder joint on movement 10/19/2017   OSA (obstructive sleep apnea) 06/26/2017   Snoring 03/25/2016   Palpitations 03/25/2016   Headache 03/25/2016   Sensory disorder of trigeminal nerve 11/26/2015   Spondylolisthesis of lumbar region 08/09/2014   Lumbar adjacent segment disease with spondylolisthesis 08/09/2014   Back pain 02/17/2014   Macular degeneration 02/17/2014   Backache 02/17/2014   Other and unspecified hyperlipidemia 10/14/2013   Hyperlipidemia 10/14/2013   Onychomycosis 12/01/2012   Arthritis 04/12/2012   Hypothyroidism 04/12/2012    PCP: Copland, MD  REFERRING PROVIDER: Josefina, MD  REFERRING DIAG: s/p Left TKA  THERAPY DIAG:  Stiffness of left knee, not elsewhere classified  Difficulty in walking, not elsewhere classified  Acute pain of left knee  Localized edema  Rationale for Evaluation and Treatment: Rehabilitation  ONSET DATE: 03/22/24  SUBJECTIVE:   SUBJECTIVE STATEMENT:  nothing has changed. Lots of pain still, especially when  leaving it down to hang and when standing/walking.     PAIN:  Are you having pain? Left knee 6/10  PRECAUTIONS: None  RED FLAGS: None   WEIGHT BEARING RESTRICTIONS: No  FALLS:  Has patient fallen in last 6 months? No  LIVING ENVIRONMENT: Lives with: lives with their family Lives in: House/apartment Stairs: Yes: Internal: 2 steps; can reach both Has following equipment at home: Single point cane, Walker - 2 wheeled, Crutches, and shower chair  OCCUPATION: retired  PLOF: Independent and shopping and housework  PATIENT GOALS: have normal walk without device, less pain  NEXT MD VISIT: 04/06/24  OBJECTIVE:  Note: Objective measures were completed at Evaluation unless otherwise noted.   COGNITION: Overall cognitive status: Within functional limits for tasks assessed     SENSATION:  WFL  EDEMA:  Circumferential: mid patella 47 cm on the left, 40 cm on the right  MUSCLE LENGTH: Tight calves and HS  POSTURE: rounded shoulders and forward head  PALPATION: Tender, ecchymosis left thigh and knee, inner and posterior  LOWER EXTREMITY ROM:  Active ROM AROM LEft eval PROM Left eval LLE 04/01/24 AROM  LLE 04/04/24 04/26/24 04/29/24 05/03/24 AROM/PROM  Hip flexion         Hip extension         Hip abduction         Hip adduction         Hip internal rotation         Hip external rotation         Knee flexion 65 70 82 85 112 110 112/117  Knee extension 15 9 5 5  0 0 4/0  Ankle dorsiflexion         Ankle plantarflexion         Ankle inversion         Ankle eversion          (Blank rows = not tested)  LOWER EXTREMITY MMT:  MMT Right eval Left eval  Hip flexion    Hip extension    Hip abduction    Hip adduction    Hip internal rotation    Hip external rotation    Knee flexion  2  Knee extension  2  Ankle dorsiflexion    Ankle plantarflexion    Ankle inversion    Ankle eversion     (Blank rows = not tested)  LOWER EXTREMITY SPECIAL TESTS:  Knee special  tests: quad lag, ballotable patella  FUNCTIONAL TESTS:  Timed up and go (TUG): 17 seconds  GAIT: Distance walked: 60 feet Assistive device utilized: Environmental Consultant - 2 wheeled Level of assistance: Modified independence Comments: mild antalgic gait                                                                                                                                TREATMENT DATE:  06/09/24 TUG 9s  NuStep L5x14mins  LAQ 3# x10, x6 HS curls green 2x10 STW to quad and ITB, followed by passive flexion and extension Fitter pushes 1 blue band x10, 2 blue bands x10 STS 2x10 Calf stretch 20s x2 ROM flexion at end of session 119d   06/03/24 AROM SOC 110 LAQ 3 # 2 sets 10 Standing HS curl 3# 2 sets 10 Standing 3# SL hip flex,ext and abd 10 x  Nustep L 5 PROM flex and ext STW to left quad and ITB Resisted gait 20# 4 ways 5 x each AROM end of session  117    05/31/24 Belt mobs to left knee to increase capsular stretch Belt mobs for flexion PROM flex and ext STW to left quad and ITB ITB stretch in SL - limited tolerance lying on RT hip Nustep L 4 6 min Left  knee MMT 4+ Left Knee AROM sitting 2-117 Green tband HS curl 2 sets 10 3# LAQ 2 sets 10 6 inch step up fwd and laterally 10 x each Calf stretch Resisted gait laterally 20 # Worked on gait to increase stride , knee flexion in swing phase and decrease circumduction that will increase ITB pull  05/27/24 Bike partial revs x5 mins Calf stretch Resisted gait 20# 4 way x4 Step ups 6 STS 2x10 Bridges 2x5 Sidelying hip abduction 2x5 ROM- 117d  05/23/24 PROM to L knee  ROM- 115d flexion ITB stretch 30s x2 HS stretch 30s x2 SLR x10, then with abd x10  NuStep L5 x60mins LE  LAQ 3# 2x12 HS curls red 2x10 Hip abd 2x12 green Calf stretch 20s x2   05/17/24 STW to left knee and quad Jt mobs including patella Jt capsule stretch PROM flex and ext Bike L 4 Green tband HS 5x pain so decreased to red still  pain so stopped STW to left lateral knee- very tender   05/13/24 STW to left knee, quad and incision with joint mobs PROM flex and ext with end range stretch AROM sitting after 0-120 Bike 6 min seat 12 L 4 Nustep L 5 6 min LE only Leg Press 30# 2 sets 10- seat 7, 6 too hard 3# LAQ 2 sets 10 Green tband HS curl 2 sets 10   05/10/24 ACT flexion  118 PROM and jt mobs flex and ext Nustep L 4 5 min Bike 5 min full rev 3# LAQ 2 sets 10 3# standing march,HS curl, SL 3 way 10x 6 in step up 10 x Red tband HS curl 2 sets 10 Leg press 30# 2 sets 10  05/06/24 PROM flex and ext HS curl 20# 10 x Knee ext 5# 10 x Bike 5 min full revolutions Nustep L 4 Left foot on dyna disc motions  LAQ 3# 10 x Standing 3# marching, hip abd, ext and HS curl 10 x each    05/03/24 Nustep level 4 x 6 minutes Bike seat 11 x 4 minutes full revolutions HS curls 20# Leg ext 5# Passive stretch Left LE Joint mobs Scar mobs Left foot on dyna disc motions  AROM 4-112 degrees PROM 0-117 degrees TUG 11 seconds without device  04/29/24 Recheck goals for 10th visit  NuStep L5 x40mins LE only  Bike seat 11 x78mins for ROM Leg ext 5# 2x10 HS curls 20# 2x10 STS 2x10 Leg press 20# 2x10 Step ups 4   04/26/24 PROM, stretching and STW Left knee and ankle/foot Nustep L 5 Func stepping multi directional for ROM Looked at gait but pt struggling d/t pain in foot and great toe AROM 0-112   04/22/24 Nustep L 5 LE only HS curl 20# 2 sets 10. 10# 10 x LLE Knee ext 5# 2 sets 10 PROM flex and ext STW to knee ,scr and quad 3# LAQ with focus on TKE 2 sets 10 Green tband HS curl 2 sets 10 Act flexion end of session 110  04/19/24 Nustep L 5 LE only Green tband HS curl 2 sets 10 3# LAQ 2 sets 10 3# standing HS curl 2 sets 10 3# 8 in step tap 2 sets 10 6 inch step up fwd and laterally 10 x each Green tband TKE 2 sets 10 PROM flex and ext AROM after 0-103 Leg Press 30# 2 sets 10 Black  bar DF and PF 15 x STS wt ball 10 x Resisted gait  fwd,back and side ways 20#   04/14/24 Nustep L 5 LE only LAQ 2# 2 sets 10 HS curls red tband 2 sets 10 ( decreased to red to increase ROM) SAQ 10 x TKE with manual resistance 10 x 6 inch step up fwd and laterally 10 x each PROM flex and ext AROM sitting EOB  2-100 Resisted squats 2 sets 10 green tband Leg press 20 # 2 sets 10    04/11/24 NuStep L5x14mins  LAQ 2# 2x10 HS curls green 2x10 Gentle PROM flexion/ext, patellar mobs AROM 95d STS 2x10 Step ups 6  HEP review  04/07/24 Nustep L 4 LAQ 2 sets 10 HS curl 2 sets 10 Leg press 20# 2 sets 10 Step stretch, step tap and calf stretch on step -HEP review and answered questions. STM to the quad and ITB area Gentle PROM into flexion and ext- tolerated fair if gentle    04/04/24 Bike partial reps x 5 minutes Gait without device x 200 feet LAQ 2x10 SAQ 2x10 STM and scar mobilization to the left knee  Feet on ball K2C, rotation, small bridge and isometric abs Ball b/n knees squeeze STM to the quad and ITB area Gentle PROM into flexion she does not tolerate this well Gait without device 200 feet SBA  04/01/24 Nustep L 4 PROM flex and ext Pat mobs Gentle STW to LEft thigh LAQ 2 sets 10 Yellow tband HS curl 2 sets 10 Step stretch Step up Calf stretch on step VASO low flat 6 min 03/30/24 Evaluation  Nustep level 3 x 5 minutes Vaso low pressure 34 degrees with elevation   PATIENT EDUCATION:  Education details: POC/HEP Person educated: Patient Education method: Explanation, Facilities Manager, and Handouts Education comprehension: verbalized understanding  HOME EXERCISE PROGRAM: Access Code: [email or web address] URL: [email or web address] Date: 04/11/2024 Prepared by: Almetta Fam  Exercises - Standing Knee Flexion Stretch on Step  - 3- x daily - 7 x weekly - 1 sets - 5-10 reps - 5-10 hold - Heel Raise on Step  - 3 x daily - 7 x weekly - 1 sets - 10  reps - Standing Gastroc Stretch on Step  - 3 x daily - 7 x weekly - 1 sets - 10 reps - Forward Step Touch  - 3 x daily - 7 x weekly - 1 sets - 10 reps - Seated Knee Flexion Slide  - 3 x daily - 7 x weekly - 1 sets - 10 reps - Sit to Stand  - 3 x daily - 7 x weekly - 1 sets - 10 reps  ASSESSMENT:  CLINICAL IMPRESSION:  patient returns with ongoing pain in knee. Her ROM is better, we continued to work on light strengthening as tolerated. She has some tender spots and knots along the lateral side of her thigh and by ITB. Pt wants to continue therapy with slow progression to not cause increased pain so session tailored to her tolerance.    OBJECTIVE IMPAIRMENTS: Abnormal gait, cardiopulmonary status limiting activity, decreased activity tolerance, decreased balance, decreased endurance, decreased mobility, difficulty walking, decreased ROM, decreased strength, increased edema, increased muscle spasms, impaired flexibility, impaired vision/preception, improper body mechanics, postural dysfunction, and pain.   REHAB POTENTIAL: Good  CLINICAL DECISION MAKING: Evolving/moderate complexity  EVALUATION COMPLEXITY: Moderate   GOALS: Goals reviewed with patient? Yes  SHORT TERM GOALS: Target date: 04/20/24 Independent with initial HEP Baseline: Goal status: 04/01/24 MET  LONG TERM GOALS: Target date: 06/29/24  Independent with advanced HEP Baseline:  Goal  status: progressing10/7/25  and 05/13/24  2.  Independent with RICE Baseline:  Goal status: MET 04/14/24  3.  Decrease pain 50% for better function and quality of life Baseline:  Goal status: progressing 04/14/24, not 50% yet progressing 04/29/24 05/06/24 progressing  05/13/24 40-50% PROGRESSING 05/31/24 progressing   6/10 pain, ITB made it worse 06/09/24  4.  Decrease TUG to 12 seconds without device Baseline:  Goal status: 04/22/24 MET, 11 seconds without device 05/03/24, 9s MET 06/09/24  5.  Increase AROM of the left knee to 4-116  degrees flexion Baseline:  Goal status: 04/14/24 2-100 progressing  and 04/22/24, 0-110d 04/29/24 progressing 05/13/24 MET  6.  Go up and down steps step over step Baseline:  Goal status: 04/14/24 progressing, progressing 04/29/24 and 05/06/24  05/31/24 reverted back since ITB flare up, still has difficulty 06/09/24   PLAN:  PT FREQUENCY: 2x/week  PT DURATION: 12 weeks  PLANNED INTERVENTIONS: 97164- PT Re-evaluation, 97110-Therapeutic exercises, 97530- Therapeutic activity, 97112- Neuromuscular re-education, 97535- Self Care, [id number]- Manual therapy, 97116- Gait training, [phone]- Electrical stimulation (unattended), 97016- Vasopneumatic device, Patient/Family education, Balance training, Stair training, Taping, Joint mobilization, and Cryotherapy  PLAN FOR NEXT SESSION: continue to progress ROM and strength slowly.    [place], PT 06/09/2024, 1:12 PM

## 2024-06-12 NOTE — Patient Instructions (Incomplete)
 It was great to see you today, I will be in touch with your

## 2024-06-12 NOTE — Progress Notes (Signed)
 [facility] [address], [address] [place], [place] [address] [phone]  Date:  06/15/2024   Name:  Debbie Hayes   DOB:  [birth year]   MRN:  [id number]  PCP:  Watt Harlene BROCKS, MD    Chief Complaint: Follow-up   History of Present Illness:  Debbie Hayes is a 73 y.o. very pleasant female patient who presents with the following:  Patient seen today for follow-up-6 weeks ago for foot pain and swelling following a recent knee replacement She had a left total knee about 5 weeks ago History of diabetes under good control, insomnia, arthritis and chronic joint pains, CAD, osteoporosis, migraine headaches, sleep apnea, hyperlipidemia, hypothyroidism   Lab Results  Component Value Date   HGBA1C 6.3 (H) 03/09/2024   Lab Results  Component Value Date   TSH 0.75 03/23/2023    Fosamax  Qulipta  Plavix  Repatha  Levothyroxine  125 Losartan  25 Metformin  500- NOT taking  Ozempic  0.5 mg weekly Toprol -XL Trazodone  at bedtime Ambien  as needed Vaginal estrogen cream Montelukast   -Due for foot exam - Urine micro can be updated - Flu shot- done  3 weeks ago  Covid done  - Eye exam- done per [facility]  - Needs tetanus update - Recommend Shingrix  Discussed the use of AI scribe software for clinical note transcription with the patient, who gave verbal consent to proceed.  History of Present Illness Debbie Hayes is a 73 year old female with diabetes who presents for evaluation of her A1c levels.  She is managing her diabetes with Ozempic  at a dose of 0.5 mg. She has not been able to exercise much recently due to knee surgery and spends a lot of time sitting. She is considering using a Dexcom for monitoring her blood sugar levels but is currently not using one.   She reports ongoing issues with her postoperative knee.  She has fibromyalgia, which exacerbates her condition. Over the summer, she developed  neuropathic pain that radiates up her thigh and sometimes into her glute. About a month ago, she developed an IT band issue, worsening her pain. She is currently attending physical therapy twice a week.  They also recently added gabapentin  300 3 times daily  She has been experiencing increased asthma symptoms, requiring albuterol  several times a day over the past few weeks, which is unusual for her. She is currently using Flovent  (fluticasone ) and also takes Singulair .  She wonders about trying a different inhaled steroid and see if it might work better for her.  We suspect her symptoms might be worse due to fall allergies  She has a history of stomach issues, exacerbated by steroid use, and is currently taking Nexium. She recalls using Carafate  in the past during pregnancy.  She wonders if she could have some Carafate  on hand to use as a as needed  Her sleep is managed with trazodone  and Ambien , which she takes every night. It takes her about an hour to fall asleep.  Recommend shingles series and tetanus booster  She mentions a skin lesion on her right upper lip that has been growing for about two years, described as a 'ragged looking thing'. She is concerned about its appearance.    Patient Active Problem List   Diagnosis Date Noted   Primary hypertension 05/12/2024   Pain of right thumb 02/26/2024   Precordial pain 12/23/2023   Primary osteoarthritis of left knee with subchondral insufficiency fractures 09/23/2023  History of arthroplasty of right knee 07/03/2023   Primary osteoarthritis of right shoulder 07/03/2023   Coronary artery disease involving native coronary artery of native heart without angina pectoris 05/25/2023   Coronary artery calcification 11/06/2022   Statin myopathy 11/06/2022   Frequent UTI 10/29/2022   Wart of hand 09/30/2022   Acute cystitis without hematuria 05/14/2021   Medial epicondylitis, right 03/25/2021   Trigger thumb, right thumb 11/19/2020   Right  wrist pain 10/23/2020   Osteoporosis 04/05/2020   Trigger finger, left middle finger 07/27/2019   Chronic neck pain 11/10/2018   Orthopedic hardware present 02/17/2018   Controlled diabetes mellitus type 2 with complications (HCC) 12/15/2017   Intractable chronic migraine without aura and with status migrainosus 12/15/2017   Arthralgia 12/15/2017   Joint pain 12/15/2017   Migraine, transformed 12/15/2017   Chronic migraine without aura, with intractable migraine, so stated, with status migrainosus 12/15/2017   Pain of left shoulder joint on movement 10/19/2017   OSA (obstructive sleep apnea) 06/26/2017   Snoring 03/25/2016   Palpitations 03/25/2016   Headache 03/25/2016   Sensory disorder of trigeminal nerve 11/26/2015   Spondylolisthesis of lumbar region 08/09/2014   Lumbar adjacent segment disease with spondylolisthesis 08/09/2014   Back pain 02/17/2014   Macular degeneration 02/17/2014   Backache 02/17/2014   Other and unspecified hyperlipidemia 10/14/2013   Hyperlipidemia 10/14/2013   Onychomycosis 12/01/2012   Arthritis 04/12/2012   Hypothyroidism 04/12/2012    Past Medical History:  Diagnosis Date   Allergy    Asthma    Cancer (HCC)    thyroid  cancer   Cataract    Chronic migraine BOTOX INJECTION EVERY 3 MONTHS   Chronic pain in right shoulder    Constipation    Coronary artery disease    Diabetes mellitus    Disorder of inner ear CAUSES VERTIGO OCCASIONALLY   Fibromyalgia    GERD (gastroesophageal reflux disease)    Hemorrhoid    History of bladder infections    History of thyroid  cancer 2009  S/P TOTAL THYROIDECTOMY AND RADIATION   Hyperlipidemia    Hypertension CARDIOLOGIST- DR BLANCA- WILL REQUEST LATE NOTE   DENIES S & S   Hypothyroidism    Insomnia    Left shoulder pain    Macular degeneration    MRSA infection    nasal treated more than 15 years ago   Normal nuclear stress test 01/25/2009   OA (osteoarthritis) JOINTS   Osteopenia    Osteoporosis     Painful orthopaedic hardware    left foot   Pneumonia    PONV (postoperative nausea and vomiting) SEVERE   Rotator cuff disorder LEFT SHOULDER RTC IMPINGMENT   Sleep apnea    mouth guard, no cpap    Sleep apnea in adult 06/26/2017   Spondylolisthesis of lumbar region    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   Wears glasses     Past Surgical History:  Procedure Laterality Date   BACK SURGERY     Dr Mavis- spondylosis   BILATERAL CARPAL TUNNEL RELEASE  1994   BILATERAL ELBOW SURG.  1999   BILATERAL SALPINGOOPHECTOMY  1993   POST-OP URETER REPAIR 12 DAYS AFTER    CATARACT EXTRACTION, BILATERAL     COLONOSCOPY     EYE SURGERY     FOOT ARTHRODESIS Left 07/10/2016   Procedure: LEFT 2ND TARSAL METATARSAL ARTHRODESIS  GASTROC RECESSION LEFT LAPIDUS MODIFIED MCBRIDE BUIONECTOMY;  Surgeon: Norleen Armor, MD;  Location: [facility];  Service:  Orthopedics;  Laterality: Left;   GASTROC RECESSION EXTREMITY Left 07/10/2016   Procedure: GASTROC RECESSION EXTREMITY;  Surgeon: Norleen Armor, MD;  Location: [facility];  Service: Orthopedics;  Laterality: Left;   HARDWARE REMOVAL Left 05/20/2018   Procedure: Left foot removal of deep implants;  Surgeon: Armor Norleen, MD;  Location: [facility];  Service: Orthopedics;  Laterality: Left;    LEFT THUMB JOINT REPLACEMENT  2005   RIGHT DONE IN 2004   OOPHORECTOMY     POLYPECTOMY     RIGHT KNEE ARTHROSCOPY  X2  BEFORE 2011   RIGHT KNEE ARTHROSCOPY/ PARTIAL LATERAL MENISECTOMY/ TRICOMPARTMENT CHONDROPLASTY/ DECOMPRESSION CYST  10/26/2009   RIGHT KNEE CLOSED MANIPULATION  09/11/2010   RIGHT SHOULDER ARTHROSCOPY  2010  &  2004   TIBIA CYST REMOVED AND ORIF LEG FX  1958   TONSILLECTOMY  1968   TOTAL KNEE ARTHROPLASTY  07/16/2010   RIGHT   TOTAL KNEE ARTHROPLASTY Left 03/22/2024   Procedure: ARTHROPLASTY, KNEE, TOTAL;  Surgeon: Josefina Chew, MD;  Location: [facility];  Service: Orthopedics;  Laterality:  Left;   TOTAL THYROIDECTOMY  2009   CANCER  (POST-OP BLEED)  AND RADIATION TX   trigger finger repair      07/2023   trigger fingers Right 2013   3rd and 4th fingers   UPPER GASTROINTESTINAL ENDOSCOPY     VAGINAL HYSTERECTOMY  1990    Social History   Tobacco Use   Smoking status: Never   Smokeless tobacco: Never  Vaping Use   Vaping status: Never Used  Substance Use Topics   Alcohol use: Yes    Comment: RARE   Drug use: No    Family History  Problem Relation Age of Onset   Alzheimer's disease Mother    Migraines Mother    Heart disease Father    Cirrhosis Father    Heart attack Father    Macular degeneration Father    Migraines Sister    Migraines Maternal Grandmother    Migraines Niece    Colon cancer Neg Hx    Esophageal cancer Neg Hx    Rectal cancer Neg Hx    Stomach cancer Neg Hx    Colon polyps Neg Hx     Allergies  Allergen Reactions   Sulfa Antibiotics Hives   Statins Other (See Comments)    Pt has tried multiple and cannot tolerate    Medication list has been reviewed and updated.  Current Outpatient Medications on File Prior to Visit  Medication Sig Dispense Refill   acetaminophen  (TYLENOL ) 500 MG tablet Take 500 mg by mouth every 6 (six) hours as needed for moderate pain (pain score 4-6).     Albuterol  Sulfate (PROAIR  RESPICLICK) 108 (90 Base) MCG/ACT AEPB Inhale 2 puffs every 4-6 hours as needed for wheezing or shortness of breath 1 each 12   alendronate  (FOSAMAX ) 70 MG tablet Take 1 tablet (70 mg total) by mouth once a week. Take with a full glass of water  on an empty stomach 12 tablet 3   Atogepant  (QULIPTA ) 60 MG TABS Take 1 tablet (60 mg total) by mouth daily. 90 tablet 3   blood glucose meter kit and supplies KIT Test blood sugar once daily. Dx code: E11.9 1 each 0   Blood Glucose Monitoring Suppl (FREESTYLE LITE) DEVI Check blood sugar no more than twice daily 1 each 0   Blood Glucose Monitoring Suppl (ONE TOUCH ULTRA 2) w/Device KIT  Dispense one meter and test  strips, lancets- 100 of each with PRN RF 1 kit 0   Cinnamon 500 MG TABS Take by mouth 3 (three) times daily.     clopidogrel  (PLAVIX ) 75 MG tablet Take 1 tablet (75 mg total) by mouth daily. 90 tablet 3   Cranberry 4200 MG CAPS Take 4,200 mg by mouth daily.     dicyclomine  (BENTYL ) 20 MG tablet Take 1 tablet (20 mg total) by mouth 4 (four) times daily as needed for spasms. 60 tablet 1   estradiol  (ESTRACE ) 0.1 MG/GM vaginal cream Place 1 Applicatorful vaginally 3 (three) times a week. (Patient taking differently: Place 1 Applicatorful vaginally 2 (two) times a week.) 42.5 g 12   Evolocumab  (REPATHA  SURECLICK) 140 MG/ML SOAJ Inject 140 mg into the skin every 14 (fourteen) days. 6 mL 3   famotidine  (PEPCID ) 40 MG tablet Take 40 mg by mouth daily.     Fexofenadine HCl (ALLEGRA PO) Take 180 mg by mouth daily.     fluticasone  (FLOVENT  HFA) 110 MCG/ACT inhaler Inhale 1-2 puffs into the lungs 2 (two) times daily. 1 each 12   gabapentin  (NEURONTIN ) 300 MG capsule Take 1 capsule (300 mg total) by mouth 3 (three) times daily.     glucose blood (FREESTYLE LITE) test strip CHECK BLOOD SUGARS NO MORE THAN TWICE DAILY 100 each 12   Guaifenesin (MUCINEX MAXIMUM STRENGTH) 1200 MG TB12 Take 1,200 mg by mouth daily as needed (congestion).     Lancets (FREESTYLE) lancets Check blood sugars no more than twice daily 100 each 12   levothyroxine  (SYNTHROID ) 125 MCG tablet TAKE 1 TABLET BY MOUTH BEFORE BREAKFAST 90 tablet 3   losartan  (COZAAR ) 25 MG tablet Take 1 tablet (25 mg total) by mouth daily. 90 tablet 0   metFORMIN  (GLUCOPHAGE ) 500 MG tablet Take 1 tablet (500 mg total) by mouth daily. 90 tablet 3   methocarbamol  (ROBAXIN ) 500 MG tablet Take 1 tablet (500 mg total) by mouth every 8 (eight) hours as needed for muscle spasms. 30 tablet 0   metoprolol  succinate (TOPROL -XL) 50 MG 24 hr tablet TAKE 1 TABLET(50 MG) BY MOUTH DAILY WITH OR IMMEDIATELY FOLLOWING A MEAL 90 tablet 1   metoprolol   tartrate (LOPRESSOR ) 25 MG tablet Take 25 mg by mouth daily.     montelukast  (SINGULAIR ) 10 MG tablet Take 1 tablet (10 mg total) by mouth at bedtime. 90 tablet 3   Olopatadine-Mometasone  (RYALTRIS ) 665-25 MCG/ACT SUSP Place 2 sprays in each nostril twice daily 29 g PRN   ondansetron  (ZOFRAN -ODT) 8 MG disintegrating tablet Take 1 tablet (8 mg total) by mouth every 8 (eight) hours as needed for nausea or vomiting. 20 tablet 0   Oxycodone  HCl 10 MG TABS Take 1-1.5 tablets (10-15 mg total) by mouth every 8 (eight) hours as needed. 50 tablet 0   Polyethylene Glycol 3350  (MIRALAX PO) Take 17 g by mouth daily as needed (constipation).     Rimegepant Sulfate (NURTEC) 75 MG TBDP Take 75 mg by mouth daily as needed (migraine).     saccharomyces boulardii (FLORASTOR) 250 MG capsule Take 250 mg by mouth 2 (two) times daily.     Semaglutide , 1 MG/DOSE, 4 MG/3ML SOPN Inject 1 mg into the skin once a week. (Patient taking differently: Inject 0.5 mg into the skin once a week.) 9 mL 3   traZODone  (DESYREL ) 100 MG tablet Take 2.5 tablets (250 mg total) by mouth at bedtime. 225 tablet 0   zolpidem  (AMBIEN ) 5 MG tablet TAKE 1 TABLET(5 MG) BY  MOUTH AT BEDTIME AS NEEDED FOR SLEEP 90 tablet 0   No current facility-administered medications on file prior to visit.    Review of Systems:  As per HPI- otherwise negative.   Physical Examination: Vitals:   06/15/24 0934  BP: 124/82  Pulse: 79  SpO2: 97%   Vitals:   06/15/24 0934  Weight: 160 lb 6.4 oz (72.8 kg)  Height: 5' 8 (1.727 m)   Body mass index is 24.39 kg/m. Ideal Body Weight: Weight in (lb) to have BMI = 25: 164.1  GEN: no acute distress. Normal weight, looks well  HEENT: Atraumatic, Normocephalic.  Ears and Nose: No external deformity. CV: RRR, No M/G/R. No JVD. No thrill. No extra heart sounds. PULM: CTA B, no wheezes, crackles, rhonchi. No retractions. No resp. distress. No accessory muscle use. ABD: S, NT, ND, +BS. No rebound. No  HSM. EXTR: No c/c/e PSYCH: Normally interactive. Conversant.  Foot exam- defer today due to recent  Minimal skin roughness on her right upper lip- pt would like cryotherapy Verbal consent obtained, cryotherapy x 2 cycles to this small skin lesion  Assessment and Plan: Mixed hyperlipidemia  Chronic joint pain - Plan: DRUG MONITORING, PANEL 8 WITH CONFIRMATION, URINE, gabapentin  (NEURONTIN ) 300 MG capsule  Controlled diabetes mellitus type 2 with complications (HCC) - Plan: Microalbumin / creatinine urine ratio, HgB A1c  Acquired hypothyroidism  Acute gastritis without hemorrhage, unspecified gastritis type - Plan: sucralfate  (CARAFATE ) 1 g tablet  Mild persistent asthma with acute exacerbation - Plan: beclomethasone (QVAR  REDIHALER) 80 MCG/ACT inhaler  Assessment & Plan Type 2 diabetes mellitus Currently managed with Ozempic  0.5 mg. Concerns about A1c levels and potential need for additional medication if A1c is elevated.  Potentially interested in Dexcom for glucose monitoring as opposed to frequent fingerstick monitoring- Checked A1c level. - Will consider Dexcom if A1c is elevated and additional medication is needed.  Mild persistent asthma with recent exacerbation Recent increase in albuterol  use, requiring several times a day. Current inhaler is Flovent , but considering a change due to lack of efficacy. Possible seasonal allergy exacerbation. - Prescribed Qvar  inhaler, 1-2 puffs twice daily. - Continue Singulair . - Monitor response to Qvar  and consider multi-ingredient inhaler if ineffective.  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 Neuropathic pain extending to thigh and glute. IT band syndrome contributing to pain. - Continue physical therapy twice a week.  Acute steroid-induced gastritis Gastritis following steroid use. Currently managed with Nexium, but seeking additional options  for severe symptoms. - Prescribed Carafate , to be taken with meals and at bedtime as needed.  Benign skin lesion of lip (likely seborrheic keratosis) Lesion on lip present for two years, likely seborrheic keratosis. - Froze lesion with liquid nitrogen.  If this does not go away please follow-up with dermatology  General Health Maintenance Up to date on flu and COVID vaccinations. Due for Shingrix and tetanus vaccinations. Pneumonia vaccination is up to date. - Recommended Shingrix vaccination. - Recommended tetanus vaccination. - Confirmed pneumonia vaccination status.  Signed Harlene Schroeder, MD  Received labs as below, message to patient  Results for orders placed or performed in visit on 06/15/24  Microalbumin / creatinine urine ratio   Collection Time: 06/15/24 10:02 AM  Result Value Ref Range   Microalb, Ur <0.7 mg/dL   Creatinine,U 23.9 mg/dL   Microalb Creat Ratio Unable to calculate 0.0 - 30.0 mg/g  HgB A1c   Collection Time: 06/15/24 10:02 AM  Result  Value Ref Range   Hgb A1c MFr Bld 6.9 (H) 4.6 - 6.5 %   *Note: Due to a large number of results and/or encounters for the requested time period, some results have not been displayed. A complete set of results can be found in Results Review.    "

## 2024-06-15 ENCOUNTER — Encounter: Payer: Self-pay | Admitting: Family Medicine

## 2024-06-15 ENCOUNTER — Ambulatory Visit: Admitting: Family Medicine

## 2024-06-15 VITALS — BP 124/82 | HR 79 | Ht 68.0 in | Wt 160.4 lb

## 2024-06-15 DIAGNOSIS — E039 Hypothyroidism, unspecified: Secondary | ICD-10-CM | POA: Diagnosis not present

## 2024-06-15 DIAGNOSIS — G8929 Other chronic pain: Secondary | ICD-10-CM

## 2024-06-15 DIAGNOSIS — M2550 Pain in unspecified joint: Secondary | ICD-10-CM

## 2024-06-15 DIAGNOSIS — J4531 Mild persistent asthma with (acute) exacerbation: Secondary | ICD-10-CM

## 2024-06-15 DIAGNOSIS — E782 Mixed hyperlipidemia: Secondary | ICD-10-CM

## 2024-06-15 DIAGNOSIS — E118 Type 2 diabetes mellitus with unspecified complications: Secondary | ICD-10-CM | POA: Diagnosis not present

## 2024-06-15 DIAGNOSIS — Z7985 Long-term (current) use of injectable non-insulin antidiabetic drugs: Secondary | ICD-10-CM

## 2024-06-15 DIAGNOSIS — K2900 Acute gastritis without bleeding: Secondary | ICD-10-CM

## 2024-06-15 LAB — MICROALBUMIN / CREATININE URINE RATIO
Creatinine,U: 76 mg/dL
Microalb Creat Ratio: UNDETERMINED mg/g (ref 0.0–30.0)
Microalb, Ur: <0.7 mg/dL

## 2024-06-15 LAB — HEMOGLOBIN A1C: Hgb A1c MFr Bld: 6.9 % — ABNORMAL HIGH (ref 4.6–6.5)

## 2024-06-15 MED ORDER — SUCRALFATE 1 G PO TABS: 1.0000 g | ORAL_TABLET | Freq: Three times a day (TID) | ORAL | 1 refills | Status: AC

## 2024-06-15 MED ORDER — QVAR REDIHALER 80 MCG/ACT IN AERB: 1.0000 | INHALATION_SPRAY | Freq: Two times a day (BID) | RESPIRATORY_TRACT | 5 refills | Status: DC

## 2024-06-16 ENCOUNTER — Ambulatory Visit: Admitting: Physical Therapy

## 2024-06-16 ENCOUNTER — Encounter: Payer: Self-pay | Admitting: Physical Therapy

## 2024-06-16 DIAGNOSIS — M25562 Pain in left knee: Secondary | ICD-10-CM

## 2024-06-16 DIAGNOSIS — M25662 Stiffness of left knee, not elsewhere classified: Secondary | ICD-10-CM

## 2024-06-16 DIAGNOSIS — R600 Localized edema: Secondary | ICD-10-CM

## 2024-06-16 DIAGNOSIS — R262 Difficulty in walking, not elsewhere classified: Secondary | ICD-10-CM

## 2024-06-16 NOTE — Therapy (Signed)
 OUTPATIENT PHYSICAL THERAPY LOWER EXTREMITY       Patient Name: Debbie Hayes MRN: [id number] DOB:1950-12-26, 73 y.o., female Today's Date: 06/16/2024  END OF SESSION:  PT End of Session - 06/16/24 1659     Visit Number 21    Date for Recertification  06/29/24    Authorization Type [employer] medicare    PT Start Time 1658    PT Stop Time 1742    PT Time Calculation (min) 44 min    Activity Tolerance Patient tolerated treatment well    Behavior During Therapy WFL for tasks assessed/performed               Past Medical History:  Diagnosis Date   Allergy    Asthma    Cancer (HCC)    thyroid  cancer   Cataract    Chronic migraine BOTOX INJECTION EVERY 3 MONTHS   Chronic pain in right shoulder    Constipation    Coronary artery disease    Diabetes mellitus    Disorder of inner ear CAUSES VERTIGO OCCASIONALLY   Fibromyalgia    GERD (gastroesophageal reflux disease)    Hemorrhoid    History of bladder infections    History of thyroid  cancer 2009  S/P TOTAL THYROIDECTOMY AND RADIATION   Hyperlipidemia    Hypertension CARDIOLOGIST- DR BLANCA- WILL REQUEST LATE NOTE   DENIES S & S   Hypothyroidism    Insomnia    Left shoulder pain    Macular degeneration    MRSA infection    nasal treated more than 15 years ago   Normal nuclear stress test 01/25/2009   OA (osteoarthritis) JOINTS   Osteopenia    Osteoporosis    Painful orthopaedic hardware    left foot   Pneumonia    PONV (postoperative nausea and vomiting) SEVERE   Rotator cuff disorder LEFT SHOULDER RTC IMPINGMENT   Sleep apnea    mouth guard, no cpap    Sleep apnea in adult 06/26/2017   Spondylolisthesis of lumbar region    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   Wears glasses    Past Surgical History:  Procedure Laterality Date   BACK SURGERY     Dr Mavis- spondylosis   BILATERAL CARPAL TUNNEL RELEASE  1994   BILATERAL ELBOW SURG.  1999   BILATERAL SALPINGOOPHECTOMY  1993   POST-OP URETER  REPAIR 12 DAYS AFTER    CATARACT EXTRACTION, BILATERAL     COLONOSCOPY     EYE SURGERY     FOOT ARTHRODESIS Left 07/10/2016   Procedure: LEFT 2ND TARSAL METATARSAL ARTHRODESIS  GASTROC RECESSION LEFT [employer] MODIFIED MCBRIDE BUIONECTOMY;  Surgeon: Norleen Armor, MD;  Location: [facility];  Service: Orthopedics;  Laterality: Left;   GASTROC RECESSION EXTREMITY Left 07/10/2016   Procedure: GASTROC RECESSION EXTREMITY;  Surgeon: Norleen Armor, MD;  Location: [facility];  Service: Orthopedics;  Laterality: Left;   HARDWARE REMOVAL Left 05/20/2018   Procedure: Left foot removal of deep implants;  Surgeon: Armor Norleen, MD;  Location: [facility];  Service: Orthopedics;  Laterality: Left;    LEFT THUMB JOINT REPLACEMENT  2005   RIGHT DONE IN 2004   OOPHORECTOMY     POLYPECTOMY     RIGHT KNEE ARTHROSCOPY  X2  BEFORE 2011   RIGHT KNEE ARTHROSCOPY/ PARTIAL LATERAL MENISECTOMY/ TRICOMPARTMENT CHONDROPLASTY/ DECOMPRESSION CYST  10/26/2009   RIGHT KNEE CLOSED MANIPULATION  09/11/2010   RIGHT SHOULDER ARTHROSCOPY  2010  &  2004  TIBIA CYST REMOVED AND ORIF LEG FX  1958   TONSILLECTOMY  1968   TOTAL KNEE ARTHROPLASTY  07/16/2010   RIGHT   TOTAL KNEE ARTHROPLASTY Left 03/22/2024   Procedure: ARTHROPLASTY, KNEE, TOTAL;  Surgeon: Josefina Chew, MD;  Location: [facility];  Service: Orthopedics;  Laterality: Left;   TOTAL THYROIDECTOMY  2009   CANCER  (POST-OP BLEED)  AND RADIATION TX   trigger finger repair      07/2023   trigger fingers Right 2013   3rd and 4th fingers   UPPER GASTROINTESTINAL ENDOSCOPY     VAGINAL HYSTERECTOMY  1990   Patient Active Problem List   Diagnosis Date Noted   Primary hypertension 05/12/2024   Pain of right thumb 02/26/2024   Precordial pain 12/23/2023   Primary osteoarthritis of left knee with subchondral insufficiency fractures 09/23/2023   History of arthroplasty of right knee 07/03/2023   Primary osteoarthritis of right  shoulder 07/03/2023   Coronary artery disease involving native coronary artery of native heart without angina pectoris 05/25/2023   Coronary artery calcification 11/06/2022   Statin myopathy 11/06/2022   Frequent UTI 10/29/2022   Wart of hand 09/30/2022   Acute cystitis without hematuria 05/14/2021   Medial epicondylitis, right 03/25/2021   Trigger thumb, right thumb 11/19/2020   Right wrist pain 10/23/2020   Osteoporosis 04/05/2020   Trigger finger, left middle finger 07/27/2019   Chronic neck pain 11/10/2018   Orthopedic hardware present 02/17/2018   Controlled diabetes mellitus type 2 with complications (HCC) 12/15/2017   Intractable chronic migraine without aura and with status migrainosus 12/15/2017   Arthralgia 12/15/2017   Joint pain 12/15/2017   Migraine, transformed 12/15/2017   Chronic migraine without aura, with intractable migraine, so stated, with status migrainosus 12/15/2017   Pain of left shoulder joint on movement 10/19/2017   OSA (obstructive sleep apnea) 06/26/2017   Snoring 03/25/2016   Palpitations 03/25/2016   Headache 03/25/2016   Sensory disorder of trigeminal nerve 11/26/2015   Spondylolisthesis of lumbar region 08/09/2014   Lumbar adjacent segment disease with spondylolisthesis 08/09/2014   Back pain 02/17/2014   Macular degeneration 02/17/2014   Backache 02/17/2014   Other and unspecified hyperlipidemia 10/14/2013   Hyperlipidemia 10/14/2013   Onychomycosis 12/01/2012   Arthritis 04/12/2012   Hypothyroidism 04/12/2012    PCP: Copland, MD  REFERRING PROVIDER: Josefina, MD  REFERRING DIAG: s/p Left TKA  THERAPY DIAG:  Stiffness of left knee, not elsewhere classified  Difficulty in walking, not elsewhere classified  Acute pain of left knee  Localized edema  Rationale for Evaluation and Treatment: Rehabilitation  ONSET DATE: 03/22/24  SUBJECTIVE:   SUBJECTIVE STATEMENT:  nothing has changed. Lots of pain still, especially when leaving it  down to hang and when standing/walking pain is a 7/10 at most times reports that if she elevates to seated surface and she can have no pain.     PAIN:  Are you having pain? Left knee 6/10  PRECAUTIONS: None  RED FLAGS: None   WEIGHT BEARING RESTRICTIONS: No  FALLS:  Has patient fallen in last 6 months? No  LIVING ENVIRONMENT: Lives with: lives with their family Lives in: House/apartment Stairs: Yes: Internal: 2 steps; can reach both Has following equipment at home: Single point cane, Walker - 2 wheeled, Crutches, and shower chair  OCCUPATION: retired  PLOF: Independent and shopping and housework  PATIENT GOALS: have normal walk without device, less pain  NEXT MD VISIT: 04/06/24  OBJECTIVE:  Note: Objective measures were completed at  Evaluation unless otherwise noted.   COGNITION: Overall cognitive status: Within functional limits for tasks assessed     SENSATION: WFL  EDEMA:  Circumferential: mid patella 47 cm on the left, 40 cm on the right  MUSCLE LENGTH: Tight calves and HS  POSTURE: rounded shoulders and forward head  PALPATION: Tender, ecchymosis left thigh and knee, inner and posterior  LOWER EXTREMITY ROM:  Active ROM AROM LEft eval PROM Left eval LLE 04/01/24 AROM  LLE 04/04/24 04/26/24 04/29/24 05/03/24 AROM/PROM  Hip flexion         Hip extension         Hip abduction         Hip adduction         Hip internal rotation         Hip external rotation         Knee flexion 65 70 82 85 112 110 112/117  Knee extension 15 9 5 5  0 0 4/0  Ankle dorsiflexion         Ankle plantarflexion         Ankle inversion         Ankle eversion          (Blank rows = not tested)  LOWER EXTREMITY MMT:  MMT Right eval Left eval  Hip flexion    Hip extension    Hip abduction    Hip adduction    Hip internal rotation    Hip external rotation    Knee flexion  2  Knee extension  2  Ankle dorsiflexion    Ankle plantarflexion    Ankle inversion    Ankle  eversion     (Blank rows = not tested)  LOWER EXTREMITY SPECIAL TESTS:  Knee special tests: quad lag, ballotable patella  FUNCTIONAL TESTS:  Timed up and go (TUG): 17 seconds  GAIT: Distance walked: 60 feet Assistive device utilized: Environmental Consultant - 2 wheeled Level of assistance: Modified independence Comments: mild antalgic gait                                                                                                                                TREATMENT DATE:  06/16/24 Feet on ball K2C, rotation, small bridge, isometric abs Ball b/n knees squeeze SAQ Passive HS and ITB stretches STM to the ITB, the scar and the left glutes Nustep level 5 LE's x 5 mintues  06/09/24 TUG 9s  NuStep L5x39mins  LAQ 3# x10, x6 HS curls green 2x10 STW to quad and ITB, followed by passive flexion and extension Fitter pushes 1 blue band x10, 2 blue bands x10 STS 2x10 Calf stretch 20s x2 ROM flexion at end of session 119d  06/03/24 AROM SOC 110 LAQ 3 # 2 sets 10 Standing HS curl 3# 2 sets 10 Standing 3# SL hip flex,ext and abd 10 x  Nustep L 5 PROM flex and ext STW to left quad and ITB  Resisted gait 20# 4 ways 5 x each AROM end of session  117  05/31/24 Belt mobs to left knee to increase capsular stretch Belt mobs for flexion PROM flex and ext STW to left quad and ITB ITB stretch in SL - limited tolerance lying on RT hip Nustep L 4 6 min Left knee MMT 4+ Left Knee AROM sitting 2-117 Green tband HS curl 2 sets 10 3# LAQ 2 sets 10 6 inch step up fwd and laterally 10 x each Calf stretch Resisted gait laterally 20 # Worked on gait to increase stride , knee flexion in swing phase and decrease circumduction that will increase ITB pull  05/27/24 Bike partial revs x5 mins Calf stretch Resisted gait 20# 4 way x4 Step ups 6 STS 2x10 Bridges 2x5 Sidelying hip abduction 2x5 ROM- 117d  05/23/24 PROM to L knee  ROM- 115d flexion ITB stretch 30s x2 HS stretch 30s x2 SLR  x10, then with abd x10  NuStep L5 x20mins LE  LAQ 3# 2x12 HS curls red 2x10 Hip abd 2x12 green Calf stretch 20s x2   05/17/24 STW to left knee and quad Jt mobs including patella Jt capsule stretch PROM flex and ext Bike L 4 Green tband HS 5x pain so decreased to red still pain so stopped STW to left lateral knee- very tender   05/13/24 STW to left knee, quad and incision with joint mobs PROM flex and ext with end range stretch AROM sitting after 0-120 Bike 6 min seat 12 L 4 Nustep L 5 6 min LE only Leg Press 30# 2 sets 10- seat 7, 6 too hard 3# LAQ 2 sets 10 Green tband HS curl 2 sets 10   05/10/24 ACT flexion  118 PROM and jt mobs flex and ext Nustep L 4 5 min Bike 5 min full rev 3# LAQ 2 sets 10 3# standing march,HS curl, SL 3 way 10x 6 in step up 10 x Red tband HS curl 2 sets 10 Leg press 30# 2 sets 10  05/06/24 PROM flex and ext HS curl 20# 10 x Knee ext 5# 10 x Bike 5 min full revolutions Nustep L 4 Left foot on dyna disc motions  LAQ 3# 10 x Standing 3# marching, hip abd, ext and HS curl 10 x each    05/03/24 Nustep level 4 x 6 minutes Bike seat 11 x 4 minutes full revolutions HS curls 20# Leg ext 5# Passive stretch Left LE Joint mobs Scar mobs Left foot on dyna disc motions  AROM 4-112 degrees PROM 0-117 degrees TUG 11 seconds without device  04/29/24 Recheck goals for 10th visit  NuStep L5 x2mins LE only  Bike seat 11 x34mins for ROM Leg ext 5# 2x10 HS curls 20# 2x10 STS 2x10 Leg press 20# 2x10 Step ups 4   04/26/24 PROM, stretching and STW Left knee and ankle/foot Nustep L 5 Func stepping multi directional for ROM Looked at gait but pt struggling d/t pain in foot and great toe AROM 0-112   04/22/24 Nustep L 5 LE only HS curl 20# 2 sets 10. 10# 10 x LLE Knee ext 5# 2 sets 10 PROM flex and ext STW to knee ,scr and quad 3# LAQ with focus on TKE 2 sets 10 Green tband HS curl 2 sets 10 Act flexion end of  session 110  04/19/24 Nustep L 5 LE only Green tband HS curl 2 sets 10 3# LAQ 2 sets 10 3# standing  HS curl 2 sets 10 3# 8 in step tap 2 sets 10 6 inch step up fwd and laterally 10 x each Green tband TKE 2 sets 10 PROM flex and ext AROM after 0-103 Leg Press 30# 2 sets 10 Black bar DF and PF 15 x STS wt ball 10 x Resisted gait fwd,back and side ways 20#   04/14/24 Nustep L 5 LE only LAQ 2# 2 sets 10 HS curls red tband 2 sets 10 ( decreased to red to increase ROM) SAQ 10 x TKE with manual resistance 10 x 6 inch step up fwd and laterally 10 x each PROM flex and ext AROM sitting EOB  2-100 Resisted squats 2 sets 10 green tband Leg press 20 # 2 sets 10    04/11/24 NuStep L5x41mins  LAQ 2# 2x10 HS curls green 2x10 Gentle PROM flexion/ext, patellar mobs AROM 95d STS 2x10 Step ups 6  HEP review  04/07/24 Nustep L 4 LAQ 2 sets 10 HS curl 2 sets 10 Leg press 20# 2 sets 10 Step stretch, step tap and calf stretch on step -HEP review and answered questions. STM to the quad and ITB area Gentle PROM into flexion and ext- tolerated fair if gentle    04/04/24 Bike partial reps x 5 minutes Gait without device x 200 feet LAQ 2x10 SAQ 2x10 STM and scar mobilization to the left knee  Feet on ball K2C, rotation, small bridge and isometric abs Ball b/n knees squeeze STM to the quad and ITB area Gentle PROM into flexion she does not tolerate this well Gait without device 200 feet SBA  04/01/24 Nustep L 4 PROM flex and ext Pat mobs Gentle STW to LEft thigh LAQ 2 sets 10 Yellow tband HS curl 2 sets 10 Step stretch Step up Calf stretch on step VASO low flat 6 min 03/30/24 Evaluation  Nustep level 3 x 5 minutes Vaso low pressure 34 degrees with elevation   PATIENT EDUCATION:  Education details: POC/HEP Person educated: Patient Education method: Explanation, Facilities Manager, and Handouts Education comprehension: verbalized understanding  HOME  EXERCISE PROGRAM: Access Code: [email or web address] URL: [email or web address] Date: 04/11/2024 Prepared by: Almetta Fam  Exercises - Standing Knee Flexion Stretch on Step  - 3- x daily - 7 x weekly - 1 sets - 5-10 reps - 5-10 hold - Heel Raise on Step  - 3 x daily - 7 x weekly - 1 sets - 10 reps - Standing Gastroc Stretch on Step  - 3 x daily - 7 x weekly - 1 sets - 10 reps - Forward Step Touch  - 3 x daily - 7 x weekly - 1 sets - 10 reps - Seated Knee Flexion Slide  - 3 x daily - 7 x weekly - 1 sets - 10 reps - Sit to Stand  - 3 x daily - 7 x weekly - 1 sets - 10 reps  ASSESSMENT:  CLINICAL IMPRESSION:  Patient continues to report pain a 7/10 with the leg bent or in a dependent position.  She reports that if she elevates the leg up to even with the seating surface, she can have no pain she reports pain in the left buttock and the left ITB.  I did some STM to the areas, she is tight with knots in the ITB, quad and buttock.   Pt wants to continue therapy with slow progression to not cause increased pain so session tailored to her tolerance. We discussed that her ROM is pretty good  and her gait is pretty good sometimes reverts to stiff legged gait.  She reports that when she took a week off she feels that she lost ROM, we decided to see 1x/week.   OBJECTIVE IMPAIRMENTS: Abnormal gait, cardiopulmonary status limiting activity, decreased activity tolerance, decreased balance, decreased endurance, decreased mobility, difficulty walking, decreased ROM, decreased strength, increased edema, increased muscle spasms, impaired flexibility, impaired vision/preception, improper body mechanics, postural dysfunction, and pain.   REHAB POTENTIAL: Good  CLINICAL DECISION MAKING: Evolving/moderate complexity  EVALUATION COMPLEXITY: Moderate   GOALS: Goals reviewed with patient? Yes  SHORT TERM GOALS: Target date: 04/20/24 Independent with initial HEP Baseline: Goal status: 04/01/24 MET  LONG TERM  GOALS: Target date: 06/29/24  Independent with advanced HEP Baseline:  Goal status: progressing10/7/25  and 05/13/24  2.  Independent with RICE Baseline:  Goal status: MET 04/14/24  3.  Decrease pain 50% for better function and quality of life Baseline:  Goal status: progressing 04/14/24, not 50% yet progressing 04/29/24 05/06/24 progressing  05/13/24 40-50% PROGRESSING 05/31/24 progressing   6/10 pain, ITB made it worse 06/16/24  4.  Decrease TUG to 12 seconds without device Baseline:  Goal status: 04/22/24 MET, 11 seconds without device 05/03/24, 9s MET 06/09/24  5.  Increase AROM of the left knee to 4-116 degrees flexion Baseline:  Goal status: 04/14/24 2-100 progressing  and 04/22/24, 0-110d 04/29/24 progressing 05/13/24 MET  6.  Go up and down steps step over step Baseline:  Goal status: 04/14/24 progressing, progressing 04/29/24 and 05/06/24  05/31/24 reverted back since ITB flare up, still has difficulty 06/16/24   PLAN:  PT FREQUENCY: 2x/week  PT DURATION: 12 weeks  PLANNED INTERVENTIONS: 97164- PT Re-evaluation, 97110-Therapeutic exercises, 97530- Therapeutic activity, 97112- Neuromuscular re-education, 97535- Self Care, [id number]- Manual therapy, 97116- Gait training, [phone]- Electrical stimulation (unattended), 97016- Vasopneumatic device, Patient/Family education, Balance training, Stair training, Taping, Joint mobilization, and Cryotherapy  PLAN FOR NEXT SESSION: continue to progress ROM and strength slowly. Will decrease to 1x/week   Matix Henshaw W, PT 06/16/2024, 5:00 PM

## 2024-06-17 ENCOUNTER — Ambulatory Visit: Admitting: Physical Therapy

## 2024-06-18 LAB — DRUG MONITORING, PANEL 8 WITH CONFIRMATION, URINE
6 Acetylmorphine: NEGATIVE ng/mL (ref <10)
Alcohol Metabolites: NEGATIVE ng/mL (ref <500)
Amphetamine: NEGATIVE ng/mL (ref <250)
Amphetamines: NEGATIVE ng/mL (ref <500)
Benzodiazepines: NEGATIVE ng/mL (ref <100)
Buprenorphine, Urine: NEGATIVE ng/mL (ref <5)
Cocaine Metabolite: NEGATIVE ng/mL (ref <150)
Creatinine: 75.2 mg/dL (ref >=20.0)
MDMA: NEGATIVE ng/mL (ref <500)
Marijuana Metabolite: NEGATIVE ng/mL (ref <20)
Methamphetamine: NEGATIVE ng/mL (ref <250)
Opiates: NEGATIVE ng/mL (ref <100)
Oxidant: NEGATIVE ug/mL (ref <200)
Oxycodone: NEGATIVE ng/mL (ref <100)
pH: 5.9 (ref 4.5–9.0)

## 2024-06-18 LAB — DM TEMPLATE

## 2024-06-20 ENCOUNTER — Encounter: Payer: Self-pay | Admitting: Family Medicine

## 2024-06-20 DIAGNOSIS — J4531 Mild persistent asthma with (acute) exacerbation: Secondary | ICD-10-CM

## 2024-06-20 DIAGNOSIS — E118 Type 2 diabetes mellitus with unspecified complications: Secondary | ICD-10-CM

## 2024-06-20 MED ORDER — DEXCOM G7 SENSOR MISC
11 refills | Status: AC
Start: 2024-06-20 — End: ?

## 2024-06-20 MED ORDER — QVAR REDIHALER 80 MCG/ACT IN AERB: 1.0000 | INHALATION_SPRAY | Freq: Two times a day (BID) | RESPIRATORY_TRACT | 5 refills | Status: DC

## 2024-06-21 ENCOUNTER — Ambulatory Visit: Admitting: Physical Therapy

## 2024-06-21 DIAGNOSIS — M25562 Pain in left knee: Secondary | ICD-10-CM

## 2024-06-21 DIAGNOSIS — M25662 Stiffness of left knee, not elsewhere classified: Secondary | ICD-10-CM

## 2024-06-21 DIAGNOSIS — R262 Difficulty in walking, not elsewhere classified: Secondary | ICD-10-CM

## 2024-06-21 NOTE — Therapy (Signed)
 OUTPATIENT PHYSICAL THERAPY LOWER EXTREMITY       Patient Name: Debbie Hayes MRN: [id number] DOB:[birth year], 73 y.o., female Today's Date: 06/21/2024  END OF SESSION:  PT End of Session - 06/21/24 0839     Visit Number 22    Date for Recertification  06/29/24    Authorization Type [employer] medicare    PT Start Time 0840    PT Stop Time 0920    PT Time Calculation (min) 40 min               Past Medical History:  Diagnosis Date   Allergy    Asthma    Cancer (HCC)    thyroid  cancer   Cataract    Chronic migraine BOTOX INJECTION EVERY 3 MONTHS   Chronic pain in right shoulder    Constipation    Coronary artery disease    Diabetes mellitus    Disorder of inner ear CAUSES VERTIGO OCCASIONALLY   Fibromyalgia    GERD (gastroesophageal reflux disease)    Hemorrhoid    History of bladder infections    History of thyroid  cancer 2009  S/P TOTAL THYROIDECTOMY AND RADIATION   Hyperlipidemia    Hypertension CARDIOLOGIST- DR BLANCA- WILL REQUEST LATE NOTE   DENIES S & S   Hypothyroidism    Insomnia    Left shoulder pain    Macular degeneration    MRSA infection    nasal treated more than 15 years ago   Normal nuclear stress test 01/25/2009   OA (osteoarthritis) JOINTS   Osteopenia    Osteoporosis    Painful orthopaedic hardware    left foot   Pneumonia    PONV (postoperative nausea and vomiting) SEVERE   Rotator cuff disorder LEFT SHOULDER RTC IMPINGMENT   Sleep apnea    mouth guard, no cpap    Sleep apnea in adult 06/26/2017   Spondylolisthesis of lumbar region    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   Wears glasses    Past Surgical History:  Procedure Laterality Date   BACK SURGERY     Dr Mavis- spondylosis   BILATERAL CARPAL TUNNEL RELEASE  1994   BILATERAL ELBOW SURG.  1999   BILATERAL SALPINGOOPHECTOMY  1993   POST-OP URETER REPAIR 12 DAYS AFTER    CATARACT EXTRACTION, BILATERAL     COLONOSCOPY     EYE SURGERY     FOOT ARTHRODESIS Left  07/10/2016   Procedure: LEFT 2ND TARSAL METATARSAL ARTHRODESIS  GASTROC RECESSION LEFT [employer] MODIFIED MCBRIDE BUIONECTOMY;  Surgeon: Norleen Armor, MD;  Location: [facility];  Service: Orthopedics;  Laterality: Left;   GASTROC RECESSION EXTREMITY Left 07/10/2016   Procedure: GASTROC RECESSION EXTREMITY;  Surgeon: Norleen Armor, MD;  Location: [facility];  Service: Orthopedics;  Laterality: Left;   HARDWARE REMOVAL Left 05/20/2018   Procedure: Left foot removal of deep implants;  Surgeon: Armor Norleen, MD;  Location: [facility];  Service: Orthopedics;  Laterality: Left;    LEFT THUMB JOINT REPLACEMENT  2005   RIGHT DONE IN 2004   OOPHORECTOMY     POLYPECTOMY     RIGHT KNEE ARTHROSCOPY  X2  BEFORE 2011   RIGHT KNEE ARTHROSCOPY/ PARTIAL LATERAL MENISECTOMY/ TRICOMPARTMENT CHONDROPLASTY/ DECOMPRESSION CYST  10/26/2009   RIGHT KNEE CLOSED MANIPULATION  09/11/2010   RIGHT SHOULDER ARTHROSCOPY  2010  &  2004   TIBIA CYST REMOVED AND ORIF LEG FX  1958   TONSILLECTOMY  1968   TOTAL  KNEE ARTHROPLASTY  07/16/2010   RIGHT   TOTAL KNEE ARTHROPLASTY Left 03/22/2024   Procedure: ARTHROPLASTY, KNEE, TOTAL;  Surgeon: Josefina Chew, MD;  Location: [facility];  Service: Orthopedics;  Laterality: Left;   TOTAL THYROIDECTOMY  2009   CANCER  (POST-OP BLEED)  AND RADIATION TX   trigger finger repair      07/2023   trigger fingers Right 2013   3rd and 4th fingers   UPPER GASTROINTESTINAL ENDOSCOPY     VAGINAL HYSTERECTOMY  1990   Patient Active Problem List   Diagnosis Date Noted   Primary hypertension 05/12/2024   Pain of right thumb 02/26/2024   Precordial pain 12/23/2023   Primary osteoarthritis of left knee with subchondral insufficiency fractures 09/23/2023   History of arthroplasty of right knee 07/03/2023   Primary osteoarthritis of right shoulder 07/03/2023   Coronary artery disease involving native coronary artery of native heart without angina  pectoris 05/25/2023   Coronary artery calcification 11/06/2022   Statin myopathy 11/06/2022   Frequent UTI 10/29/2022   Wart of hand 09/30/2022   Acute cystitis without hematuria 05/14/2021   Medial epicondylitis, right 03/25/2021   Trigger thumb, right thumb 11/19/2020   Right wrist pain 10/23/2020   Osteoporosis 04/05/2020   Trigger finger, left middle finger 07/27/2019   Chronic neck pain 11/10/2018   Orthopedic hardware present 02/17/2018   Controlled diabetes mellitus type 2 with complications (HCC) 12/15/2017   Intractable chronic migraine without aura and with status migrainosus 12/15/2017   Arthralgia 12/15/2017   Joint pain 12/15/2017   Migraine, transformed 12/15/2017   Chronic migraine without aura, with intractable migraine, so stated, with status migrainosus 12/15/2017   Pain of left shoulder joint on movement 10/19/2017   OSA (obstructive sleep apnea) 06/26/2017   Snoring 03/25/2016   Palpitations 03/25/2016   Headache 03/25/2016   Sensory disorder of trigeminal nerve 11/26/2015   Spondylolisthesis of lumbar region 08/09/2014   Lumbar adjacent segment disease with spondylolisthesis 08/09/2014   Back pain 02/17/2014   Macular degeneration 02/17/2014   Backache 02/17/2014   Other and unspecified hyperlipidemia 10/14/2013   Hyperlipidemia 10/14/2013   Onychomycosis 12/01/2012   Arthritis 04/12/2012   Hypothyroidism 04/12/2012    PCP: Copland, MD  REFERRING PROVIDER: Josefina, MD  REFERRING DIAG: s/p Left TKA  THERAPY DIAG:  Stiffness of left knee, not elsewhere classified  Difficulty in walking, not elsewhere classified  Acute pain of left knee  Rationale for Evaluation and Treatment: Rehabilitation  ONSET DATE: 03/22/24  SUBJECTIVE:   SUBJECTIVE STATEMENT:  quilted 3 hours this weekend , so leg down more and I have paid for it since  PAIN:  Are you having pain? Left knee 6-7/10  PRECAUTIONS: None  RED FLAGS: None   WEIGHT BEARING RESTRICTIONS:  No  FALLS:  Has patient fallen in last 6 months? No  LIVING ENVIRONMENT: Lives with: lives with their family Lives in: House/apartment Stairs: Yes: Internal: 2 steps; can reach both Has following equipment at home: Single point cane, Walker - 2 wheeled, Crutches, and shower chair  OCCUPATION: retired  PLOF: Independent and shopping and housework  PATIENT GOALS: have normal walk without device, less pain  NEXT MD VISIT: 04/06/24  OBJECTIVE:  Note: Objective measures were completed at Evaluation unless otherwise noted.   COGNITION: Overall cognitive status: Within functional limits for tasks assessed     SENSATION: WFL  EDEMA:  Circumferential: mid patella 47 cm on the left, 40 cm on the right  MUSCLE LENGTH: Tight calves and  HS  POSTURE: rounded shoulders and forward head  PALPATION: Tender, ecchymosis left thigh and knee, inner and posterior  LOWER EXTREMITY ROM:  Active ROM AROM LEft eval PROM Left eval LLE 04/01/24 AROM  LLE 04/04/24 04/26/24 04/29/24 05/03/24 AROM/PROM  Hip flexion         Hip extension         Hip abduction         Hip adduction         Hip internal rotation         Hip external rotation         Knee flexion 65 70 82 85 112 110 112/117  Knee extension 15 9 5 5  0 0 4/0  Ankle dorsiflexion         Ankle plantarflexion         Ankle inversion         Ankle eversion          (Blank rows = not tested)  LOWER EXTREMITY MMT:  MMT Right eval Left eval  Hip flexion    Hip extension    Hip abduction    Hip adduction    Hip internal rotation    Hip external rotation    Knee flexion  2  Knee extension  2  Ankle dorsiflexion    Ankle plantarflexion    Ankle inversion    Ankle eversion     (Blank rows = not tested)  LOWER EXTREMITY SPECIAL TESTS:  Knee special tests: quad lag, ballotable patella  FUNCTIONAL TESTS:  Timed up and go (TUG): 17 seconds  GAIT: Distance walked: 60 feet Assistive device utilized: Environmental Consultant - 2  wheeled Level of assistance: Modified independence Comments: mild antalgic gait                                                                                                                                TREATMENT DATE:  06/21/24 Nustep L 5 Feet on ball K2C, rotation, small bridge, isometric abs Ball b/n knees squeeze SAQ Passive HS and ITB stretches STM to the ITB, the scar and the left glutes AROM 117 Green tband HS curl 2 sets 10 LAQ 2# 2 sets 10 Green tband TKE 2 sets 10     06/16/24 Feet on ball K2C, rotation, small bridge, isometric abs Ball b/n knees squeeze SAQ Passive HS and ITB stretches STM to the ITB, the scar and the left glutes Nustep level 5 LE's x 5 mintues  06/09/24 TUG 9s  NuStep L5x65mins  LAQ 3# x10, x6 HS curls green 2x10 STW to quad and ITB, followed by passive flexion and extension Fitter pushes 1 blue band x10, 2 blue bands x10 STS 2x10 Calf stretch 20s x2 ROM flexion at end of session 119d  06/03/24 AROM SOC 110 LAQ 3 # 2 sets 10 Standing HS curl 3# 2 sets 10 Standing 3# SL hip flex,ext and abd 10 x  Nustep L 5 PROM flex and ext STW to left quad and ITB Resisted gait 20# 4 ways 5 x each AROM end of session  117  05/31/24 Belt mobs to left knee to increase capsular stretch Belt mobs for flexion PROM flex and ext STW to left quad and ITB ITB stretch in SL - limited tolerance lying on RT hip Nustep L 4 6 min Left knee MMT 4+ Left Knee AROM sitting 2-117 Green tband HS curl 2 sets 10 3# LAQ 2 sets 10 6 inch step up fwd and laterally 10 x each Calf stretch Resisted gait laterally 20 # Worked on gait to increase stride , knee flexion in swing phase and decrease circumduction that will increase ITB pull  05/27/24 Bike partial revs x5 mins Calf stretch Resisted gait 20# 4 way x4 Step ups 6 STS 2x10 Bridges 2x5 Sidelying hip abduction 2x5 ROM- 117d  05/23/24 PROM to L knee  ROM- 115d flexion ITB stretch 30s  x2 HS stretch 30s x2 SLR x10, then with abd x10  NuStep L5 x88mins LE  LAQ 3# 2x12 HS curls red 2x10 Hip abd 2x12 green Calf stretch 20s x2   05/17/24 STW to left knee and quad Jt mobs including patella Jt capsule stretch PROM flex and ext Bike L 4 Green tband HS 5x pain so decreased to red still pain so stopped STW to left lateral knee- very tender   05/13/24 STW to left knee, quad and incision with joint mobs PROM flex and ext with end range stretch AROM sitting after 0-120 Bike 6 min seat 12 L 4 Nustep L 5 6 min LE only Leg Press 30# 2 sets 10- seat 7, 6 too hard 3# LAQ 2 sets 10 Green tband HS curl 2 sets 10   05/10/24 ACT flexion  118 PROM and jt mobs flex and ext Nustep L 4 5 min Bike 5 min full rev 3# LAQ 2 sets 10 3# standing march,HS curl, SL 3 way 10x 6 in step up 10 x Red tband HS curl 2 sets 10 Leg press 30# 2 sets 10  05/06/24 PROM flex and ext HS curl 20# 10 x Knee ext 5# 10 x Bike 5 min full revolutions Nustep L 4 Left foot on dyna disc motions  LAQ 3# 10 x Standing 3# marching, hip abd, ext and HS curl 10 x each    05/03/24 Nustep level 4 x 6 minutes Bike seat 11 x 4 minutes full revolutions HS curls 20# Leg ext 5# Passive stretch Left LE Joint mobs Scar mobs Left foot on dyna disc motions  AROM 4-112 degrees PROM 0-117 degrees TUG 11 seconds without device  04/29/24 Recheck goals for 10th visit  NuStep L5 x74mins LE only  Bike seat 11 x57mins for ROM Leg ext 5# 2x10 HS curls 20# 2x10 STS 2x10 Leg press 20# 2x10 Step ups 4   04/26/24 PROM, stretching and STW Left knee and ankle/foot Nustep L 5 Func stepping multi directional for ROM Looked at gait but pt struggling d/t pain in foot and great toe AROM 0-112   04/22/24 Nustep L 5 LE only HS curl 20# 2 sets 10. 10# 10 x LLE Knee ext 5# 2 sets 10 PROM flex and ext STW to knee ,scr and quad 3# LAQ with focus on TKE 2 sets 10 Green tband HS curl 2 sets  10 Act flexion end of session 110  04/19/24 Nustep L 5 LE only  Green tband HS curl 2 sets 10 3# LAQ 2 sets 10 3# standing HS curl 2 sets 10 3# 8 in step tap 2 sets 10 6 inch step up fwd and laterally 10 x each Green tband TKE 2 sets 10 PROM flex and ext AROM after 0-103 Leg Press 30# 2 sets 10 Black bar DF and PF 15 x STS wt ball 10 x Resisted gait fwd,back and side ways 20#   04/14/24 Nustep L 5 LE only LAQ 2# 2 sets 10 HS curls red tband 2 sets 10 ( decreased to red to increase ROM) SAQ 10 x TKE with manual resistance 10 x 6 inch step up fwd and laterally 10 x each PROM flex and ext AROM sitting EOB  2-100 Resisted squats 2 sets 10 green tband Leg press 20 # 2 sets 10    04/11/24 NuStep L5x87mins  LAQ 2# 2x10 HS curls green 2x10 Gentle PROM flexion/ext, patellar mobs AROM 95d STS 2x10 Step ups 6  HEP review  04/07/24 Nustep L 4 LAQ 2 sets 10 HS curl 2 sets 10 Leg press 20# 2 sets 10 Step stretch, step tap and calf stretch on step -HEP review and answered questions. STM to the quad and ITB area Gentle PROM into flexion and ext- tolerated fair if gentle    04/04/24 Bike partial reps x 5 minutes Gait without device x 200 feet LAQ 2x10 SAQ 2x10 STM and scar mobilization to the left knee  Feet on ball K2C, rotation, small bridge and isometric abs Ball b/n knees squeeze STM to the quad and ITB area Gentle PROM into flexion she does not tolerate this well Gait without device 200 feet SBA  04/01/24 Nustep L 4 PROM flex and ext Pat mobs Gentle STW to LEft thigh LAQ 2 sets 10 Yellow tband HS curl 2 sets 10 Step stretch Step up Calf stretch on step VASO low flat 6 min 03/30/24 Evaluation  Nustep level 3 x 5 minutes Vaso low pressure 34 degrees with elevation   PATIENT EDUCATION:  Education details: POC/HEP Person educated: Patient Education method: Explanation, Facilities Manager, and Handouts Education comprehension: verbalized  understanding  HOME EXERCISE PROGRAM: Access Code: [email or web address] URL: [email or web address] Date: 04/11/2024 Prepared by: Almetta Fam  Exercises - Standing Knee Flexion Stretch on Step  - 3- x daily - 7 x weekly - 1 sets - 5-10 reps - 5-10 hold - Heel Raise on Step  - 3 x daily - 7 x weekly - 1 sets - 10 reps - Standing Gastroc Stretch on Step  - 3 x daily - 7 x weekly - 1 sets - 10 reps - Forward Step Touch  - 3 x daily - 7 x weekly - 1 sets - 10 reps - Seated Knee Flexion Slide  - 3 x daily - 7 x weekly - 1 sets - 10 reps - Sit to Stand  - 3 x daily - 7 x weekly - 1 sets - 10 reps  ASSESSMENT:  CLINICAL IMPRESSION:  pt continues to progress slowly with pain limitations and IT issues. Slow ex progression. STW to tolerance. AROM 0-117     Patient continues to report pain a 7/10 with the leg bent or in a dependent position.  She reports that if she elevates the leg up to even with the seating surface, she can have no pain she reports pain in the left buttock and the left ITB.  I did some STM to the areas, she is tight  with knots in the ITB, quad and buttock.   Pt wants to continue therapy with slow progression to not cause increased pain so session tailored to her tolerance. We discussed that her ROM is pretty good and her gait is pretty good sometimes reverts to stiff legged gait.  She reports that when she took a week off she feels that she lost ROM, we decided to see 1x/week.   OBJECTIVE IMPAIRMENTS: Abnormal gait, cardiopulmonary status limiting activity, decreased activity tolerance, decreased balance, decreased endurance, decreased mobility, difficulty walking, decreased ROM, decreased strength, increased edema, increased muscle spasms, impaired flexibility, impaired vision/preception, improper body mechanics, postural dysfunction, and pain.   REHAB POTENTIAL: Good  CLINICAL DECISION MAKING: Evolving/moderate complexity  EVALUATION COMPLEXITY: Moderate   GOALS: Goals  reviewed with patient? Yes  SHORT TERM GOALS: Target date: 04/20/24 Independent with initial HEP Baseline: Goal status: 04/01/24 MET  LONG TERM GOALS: Target date: 06/29/24  Independent with advanced HEP Baseline:  Goal status: progressing10/7/25  and 05/13/24  2.  Independent with RICE Baseline:  Goal status: MET 04/14/24  3.  Decrease pain 50% for better function and quality of life Baseline:  Goal status: progressing 04/14/24, not 50% yet progressing 04/29/24 05/06/24 progressing  05/13/24 40-50% PROGRESSING 05/31/24 progressing   6/10 pain, ITB made it worse 06/16/24 and 06/21/24  4.  Decrease TUG to 12 seconds without device Baseline:  Goal status: 04/22/24 MET, 11 seconds without device 05/03/24, 9s MET 06/09/24  5.  Increase AROM of the left knee to 4-116 degrees flexion Baseline:  Goal status: 04/14/24 2-100 progressing  and 04/22/24, 0-110d 04/29/24 progressing 05/13/24 MET  6.  Go up and down steps step over step Baseline:  Goal status: 04/14/24 progressing, progressing 04/29/24 and 05/06/24  05/31/24 reverted back since ITB flare up, still has difficulty 06/16/24 and 06/21/24   PLAN:  PT FREQUENCY: 2x/week  PT DURATION: 12 weeks  PLANNED INTERVENTIONS: 97164- PT Re-evaluation, 97110-Therapeutic exercises, 97530- Therapeutic activity, 97112- Neuromuscular re-education, 97535- Self Care, [id number]- Manual therapy, 97116- Gait training, [phone]- Electrical stimulation (unattended), 97016- Vasopneumatic device, Patient/Family education, Balance training, Stair training, Taping, Joint mobilization, and Cryotherapy  PLAN FOR NEXT SESSION: continue to progress ROM and strength slowly. Will decrease to 1x/week   Viney Acocella,ANGIE, PTA 06/21/2024, 8:40 AM

## 2024-06-25 NOTE — Progress Notes (Signed)
  Healthcare at [facility] [address] [place], [place] [address] [phone]  Date:  06/29/2024   Name:  Debbie Hayes   DOB:  07-Feb-1951   MRN:  [id number]  PCP:  Watt Harlene BROCKS, MD    Chief Complaint: No chief complaint on file.   History of Present Illness:  Debbie Hayes is a 73 y.o. very pleasant female patient who presents with the following:  Pt seen today for follow-up of DM and to discuss irritation of her nose  I last saw her just recently on 11/19 at which time her DM was under good control  History of diabetes under good control, insomnia, arthritis and chronic joint pains, CAD, osteoporosis, migraine headaches, sleep apnea, hyperlipidemia, hypothyroidism   Lab Results  Component Value Date   HGBA1C 6.9 (H) 06/15/2024      @[email or web address] @  Patient Active Problem List   Diagnosis Date Noted   Primary hypertension 05/12/2024   Pain of right thumb 02/26/2024   Precordial pain 12/23/2023   Primary osteoarthritis of left knee with subchondral insufficiency fractures 09/23/2023   History of arthroplasty of right knee 07/03/2023   Primary osteoarthritis of right shoulder 07/03/2023   Coronary artery disease involving native coronary artery of native heart without angina pectoris 05/25/2023   Coronary artery calcification 11/06/2022   Statin myopathy 11/06/2022   Frequent UTI 10/29/2022   Wart of hand 09/30/2022   Acute cystitis without hematuria 05/14/2021   Medial epicondylitis, right 03/25/2021   Trigger thumb, right thumb 11/19/2020   Right wrist pain 10/23/2020   Osteoporosis 04/05/2020   Trigger finger, left middle finger 07/27/2019   Chronic neck pain 11/10/2018   Orthopedic hardware present 02/17/2018   Controlled diabetes mellitus type 2 with complications (HCC) 12/15/2017   Intractable chronic migraine without aura and with status migrainosus 12/15/2017   Arthralgia 12/15/2017    Joint pain 12/15/2017   Migraine, transformed 12/15/2017   Chronic migraine without aura, with intractable migraine, so stated, with status migrainosus 12/15/2017   Pain of left shoulder joint on movement 10/19/2017   OSA (obstructive sleep apnea) 06/26/2017   Snoring 03/25/2016   Palpitations 03/25/2016   Headache 03/25/2016   Sensory disorder of trigeminal nerve 11/26/2015   Spondylolisthesis of lumbar region 08/09/2014   Lumbar adjacent segment disease with spondylolisthesis 08/09/2014   Back pain 02/17/2014   Macular degeneration 02/17/2014   Backache 02/17/2014   Other and unspecified hyperlipidemia 10/14/2013   Hyperlipidemia 10/14/2013   Onychomycosis 12/01/2012   Arthritis 04/12/2012   Hypothyroidism 04/12/2012    Past Medical History:  Diagnosis Date   Allergy    Asthma    Cancer (HCC)    thyroid  cancer   Cataract    Chronic migraine BOTOX INJECTION EVERY 3 MONTHS   Chronic pain in right shoulder    Constipation    Coronary artery disease    Diabetes mellitus    Disorder of inner ear CAUSES VERTIGO OCCASIONALLY   Fibromyalgia    GERD (gastroesophageal reflux disease)    Hemorrhoid    History of bladder infections    History of thyroid  cancer 2009  S/P TOTAL THYROIDECTOMY AND RADIATION   Hyperlipidemia    Hypertension CARDIOLOGIST- DR BLANCA- WILL REQUEST LATE NOTE   DENIES S & S   Hypothyroidism    Insomnia    Left shoulder pain    Macular degeneration    MRSA infection    nasal treated more than  15 years ago   Normal nuclear stress test 01/25/2009   OA (osteoarthritis) JOINTS   Osteopenia    Osteoporosis    Painful orthopaedic hardware    left foot   Pneumonia    PONV (postoperative nausea and vomiting) SEVERE   Rotator cuff disorder LEFT SHOULDER RTC IMPINGMENT   Sleep apnea    mouth guard, no cpap    Sleep apnea in adult 06/26/2017   Spondylolisthesis of lumbar region    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   Wears glasses     Past  Surgical History:  Procedure Laterality Date   BACK SURGERY     Dr Mavis- spondylosis   BILATERAL CARPAL TUNNEL RELEASE  1994   BILATERAL ELBOW SURG.  1999   BILATERAL SALPINGOOPHECTOMY  1993   POST-OP URETER REPAIR 12 DAYS AFTER    CATARACT EXTRACTION, BILATERAL     COLONOSCOPY     EYE SURGERY     FOOT ARTHRODESIS Left 07/10/2016   Procedure: LEFT 2ND TARSAL METATARSAL ARTHRODESIS  GASTROC RECESSION LEFT LAPIDUS MODIFIED MCBRIDE BUIONECTOMY;  Surgeon: Norleen Armor, MD;  Location: [facility];  Service: Orthopedics;  Laterality: Left;   GASTROC RECESSION EXTREMITY Left 07/10/2016   Procedure: GASTROC RECESSION EXTREMITY;  Surgeon: Norleen Armor, MD;  Location: [facility];  Service: Orthopedics;  Laterality: Left;   HARDWARE REMOVAL Left 05/20/2018   Procedure: Left foot removal of deep implants;  Surgeon: Armor Norleen, MD;  Location: [facility];  Service: Orthopedics;  Laterality: Left;    LEFT THUMB JOINT REPLACEMENT  2005   RIGHT DONE IN 2004   OOPHORECTOMY     POLYPECTOMY     RIGHT KNEE ARTHROSCOPY  X2  BEFORE 2011   RIGHT KNEE ARTHROSCOPY/ PARTIAL LATERAL MENISECTOMY/ TRICOMPARTMENT CHONDROPLASTY/ DECOMPRESSION CYST  10/26/2009   RIGHT KNEE CLOSED MANIPULATION  09/11/2010   RIGHT SHOULDER ARTHROSCOPY  2010  &  2004   TIBIA CYST REMOVED AND ORIF LEG FX  1958   TONSILLECTOMY  1968   TOTAL KNEE ARTHROPLASTY  07/16/2010   RIGHT   TOTAL KNEE ARTHROPLASTY Left 03/22/2024   Procedure: ARTHROPLASTY, KNEE, TOTAL;  Surgeon: Josefina Chew, MD;  Location: [facility];  Service: Orthopedics;  Laterality: Left;   TOTAL THYROIDECTOMY  2009   CANCER  (POST-OP BLEED)  AND RADIATION TX   trigger finger repair      07/2023   trigger fingers Right 2013   3rd and 4th fingers   UPPER GASTROINTESTINAL ENDOSCOPY     VAGINAL HYSTERECTOMY  1990    Social History   Tobacco Use   Smoking status: Never   Smokeless tobacco: Never  Vaping Use   Vaping  status: Never Used  Substance Use Topics   Alcohol use: Yes    Comment: RARE   Drug use: No    Family History  Problem Relation Age of Onset   Alzheimer's disease Mother    Migraines Mother    Heart disease Father    Cirrhosis Father    Heart attack Father    Macular degeneration Father    Migraines Sister    Migraines Maternal Grandmother    Migraines Niece    Colon cancer Neg Hx    Esophageal cancer Neg Hx    Rectal cancer Neg Hx    Stomach cancer Neg Hx    Colon polyps Neg Hx     Allergies  Allergen Reactions   Sulfa Antibiotics Hives   Statins Other (See  Comments)    Pt has tried multiple and cannot tolerate    Medication list has been reviewed and updated.  Current Outpatient Medications on File Prior to Visit  Medication Sig Dispense Refill   acetaminophen  (TYLENOL ) 500 MG tablet Take 500 mg by mouth every 6 (six) hours as needed for moderate pain (pain score 4-6).     Albuterol  Sulfate (PROAIR  RESPICLICK) 108 (90 Base) MCG/ACT AEPB Inhale 2 puffs every 4-6 hours as needed for wheezing or shortness of breath 1 each 12   alendronate  (FOSAMAX ) 70 MG tablet Take 1 tablet (70 mg total) by mouth once a week. Take with a full glass of water  on an empty stomach 12 tablet 3   Atogepant  (QULIPTA ) 60 MG TABS Take 1 tablet (60 mg total) by mouth daily. 90 tablet 3   beclomethasone (QVAR  REDIHALER) 80 MCG/ACT inhaler Inhale 1-2 puffs into the lungs 2 (two) times daily. 1 each 5   blood glucose meter kit and supplies KIT Test blood sugar once daily. Dx code: E11.9 1 each 0   Blood Glucose Monitoring Suppl (FREESTYLE LITE) DEVI Check blood sugar no more than twice daily 1 each 0   Blood Glucose Monitoring Suppl (ONE TOUCH ULTRA 2) w/Device KIT Dispense one meter and test strips, lancets- 100 of each with PRN RF 1 kit 0   Cinnamon 500 MG TABS Take by mouth 3 (three) times daily.     clopidogrel  (PLAVIX ) 75 MG tablet Take 1 tablet (75 mg total) by mouth daily. 90 tablet 3    Continuous Glucose Sensor (DEXCOM G7 SENSOR) MISC Use as directed to monitor glucose 3 each 11   Cranberry 4200 MG CAPS Take 4,200 mg by mouth daily.     dicyclomine  (BENTYL ) 20 MG tablet Take 1 tablet (20 mg total) by mouth 4 (four) times daily as needed for spasms. 60 tablet 1   estradiol  (ESTRACE ) 0.1 MG/GM vaginal cream Place 1 Applicatorful vaginally 3 (three) times a week. (Patient taking differently: Place 1 Applicatorful vaginally 2 (two) times a week.) 42.5 g 12   Evolocumab  (REPATHA  SURECLICK) 140 MG/ML SOAJ Inject 140 mg into the skin every 14 (fourteen) days. 6 mL 3   famotidine  (PEPCID ) 40 MG tablet Take 40 mg by mouth daily.     Fexofenadine HCl (ALLEGRA PO) Take 180 mg by mouth daily.     fluticasone  (FLOVENT  HFA) 110 MCG/ACT inhaler Inhale 1-2 puffs into the lungs 2 (two) times daily. 1 each 12   gabapentin  (NEURONTIN ) 300 MG capsule Take 1 capsule (300 mg total) by mouth 3 (three) times daily.     glucose blood (FREESTYLE LITE) test strip CHECK BLOOD SUGARS NO MORE THAN TWICE DAILY 100 each 12   Guaifenesin (MUCINEX MAXIMUM STRENGTH) 1200 MG TB12 Take 1,200 mg by mouth daily as needed (congestion).     Lancets (FREESTYLE) lancets Check blood sugars no more than twice daily 100 each 12   levothyroxine  (SYNTHROID ) 125 MCG tablet TAKE 1 TABLET BY MOUTH BEFORE BREAKFAST 90 tablet 3   losartan  (COZAAR ) 25 MG tablet Take 1 tablet (25 mg total) by mouth daily. 90 tablet 0   metFORMIN  (GLUCOPHAGE ) 500 MG tablet Take 1 tablet (500 mg total) by mouth daily. 90 tablet 3   methocarbamol  (ROBAXIN ) 500 MG tablet Take 1 tablet (500 mg total) by mouth every 8 (eight) hours as needed for muscle spasms. 30 tablet 0   metoprolol  succinate (TOPROL -XL) 50 MG 24 hr tablet TAKE 1 TABLET(50 MG) BY MOUTH DAILY  WITH OR IMMEDIATELY FOLLOWING A MEAL 90 tablet 1   metoprolol  tartrate (LOPRESSOR ) 25 MG tablet Take 25 mg by mouth daily.     montelukast  (SINGULAIR ) 10 MG tablet Take 1 tablet (10 mg total) by  mouth at bedtime. 90 tablet 3   Olopatadine-Mometasone  (RYALTRIS ) 665-25 MCG/ACT SUSP Place 2 sprays in each nostril twice daily 29 g PRN   ondansetron  (ZOFRAN -ODT) 8 MG disintegrating tablet Take 1 tablet (8 mg total) by mouth every 8 (eight) hours as needed for nausea or vomiting. 20 tablet 0   Oxycodone  HCl 10 MG TABS Take 1-1.5 tablets (10-15 mg total) by mouth every 8 (eight) hours as needed. 50 tablet 0   Polyethylene Glycol 3350  (MIRALAX PO) Take 17 g by mouth daily as needed (constipation).     Rimegepant Sulfate (NURTEC) 75 MG TBDP Take 75 mg by mouth daily as needed (migraine).     saccharomyces boulardii (FLORASTOR) 250 MG capsule Take 250 mg by mouth 2 (two) times daily.     Semaglutide , 1 MG/DOSE, 4 MG/3ML SOPN Inject 1 mg into the skin once a week. (Patient taking differently: Inject 0.5 mg into the skin once a week.) 9 mL 3   sucralfate  (CARAFATE ) 1 g tablet Take 1 tablet (1 g total) by mouth 4 (four) times daily -  with meals and at bedtime. Use as needed for gastritis 50 tablet 1   traZODone  (DESYREL ) 100 MG tablet Take 2.5 tablets (250 mg total) by mouth at bedtime. 225 tablet 0   zolpidem  (AMBIEN ) 5 MG tablet TAKE 1 TABLET(5 MG) BY MOUTH AT BEDTIME AS NEEDED FOR SLEEP 90 tablet 0   No current facility-administered medications on file prior to visit.    Review of Systems:  As per HPI- otherwise negative.   Physical Examination: There were no vitals filed for this visit. There were no vitals filed for this visit. There is no height or weight on file to calculate BMI. Ideal Body Weight:    GEN: no acute distress. HEENT: Atraumatic, Normocephalic.  Ears and Nose: No external deformity. CV: RRR, No M/G/R. No JVD. No thrill. No extra heart sounds. PULM: CTA B, no wheezes, crackles, rhonchi. No retractions. No resp. distress. No accessory muscle use. ABD: S, NT, ND, +BS. No rebound. No HSM. EXTR: No c/c/e PSYCH: Normally interactive. Conversant.    Assessment and  Plan: No diagnosis found.  Assessment & Plan   Signed Harlene Schroeder, MD

## 2024-06-27 ENCOUNTER — Ambulatory Visit: Admitting: Physical Therapy

## 2024-06-29 ENCOUNTER — Encounter: Payer: Self-pay | Admitting: Physical Therapy

## 2024-06-29 ENCOUNTER — Ambulatory Visit: Admitting: Family Medicine

## 2024-06-29 ENCOUNTER — Ambulatory Visit: Admitting: Physical Therapy

## 2024-06-29 VITALS — BP 124/78 | HR 73 | Temp 97.7°F | Ht 68.0 in | Wt 160.8 lb

## 2024-06-29 DIAGNOSIS — E118 Type 2 diabetes mellitus with unspecified complications: Secondary | ICD-10-CM | POA: Diagnosis not present

## 2024-06-29 DIAGNOSIS — M25562 Pain in left knee: Secondary | ICD-10-CM | POA: Insufficient documentation

## 2024-06-29 DIAGNOSIS — Z7984 Long term (current) use of oral hypoglycemic drugs: Secondary | ICD-10-CM | POA: Diagnosis not present

## 2024-06-29 DIAGNOSIS — J340 Abscess, furuncle and carbuncle of nose: Secondary | ICD-10-CM

## 2024-06-29 DIAGNOSIS — R600 Localized edema: Secondary | ICD-10-CM | POA: Diagnosis present

## 2024-06-29 DIAGNOSIS — M25662 Stiffness of left knee, not elsewhere classified: Secondary | ICD-10-CM | POA: Insufficient documentation

## 2024-06-29 DIAGNOSIS — R262 Difficulty in walking, not elsewhere classified: Secondary | ICD-10-CM | POA: Insufficient documentation

## 2024-06-29 MED ORDER — EMPAGLIFLOZIN 10 MG PO TABS: 10.0000 mg | ORAL_TABLET | Freq: Every day | ORAL | 3 refills | Status: AC

## 2024-06-29 MED ORDER — CEPHALEXIN 500 MG PO CAPS: 500.0000 mg | ORAL_CAPSULE | Freq: Three times a day (TID) | ORAL | 0 refills | Status: AC

## 2024-06-29 NOTE — Therapy (Signed)
 OUTPATIENT PHYSICAL THERAPY LOWER EXTREMITY       Patient Name: Debbie Hayes MRN: [id number] DOB:06/02/1951, 73 y.o., female Today's Date: 06/29/2024  END OF SESSION:  PT End of Session - 06/29/24 1102     Visit Number 23    Date for Recertification  08/05/23    Authorization Type [employer] medicare    PT Start Time 1100    PT Stop Time 1142    PT Time Calculation (min) 42 min    Activity Tolerance Patient tolerated treatment well    Behavior During Therapy WFL for tasks assessed/performed               Past Medical History:  Diagnosis Date   Allergy    Asthma    Cancer (HCC)    thyroid  cancer   Cataract    Chronic migraine BOTOX INJECTION EVERY 3 MONTHS   Chronic pain in right shoulder    Constipation    Coronary artery disease    Diabetes mellitus    Disorder of inner ear CAUSES VERTIGO OCCASIONALLY   Fibromyalgia    GERD (gastroesophageal reflux disease)    Hemorrhoid    History of bladder infections    History of thyroid  cancer 2009  S/P TOTAL THYROIDECTOMY AND RADIATION   Hyperlipidemia    Hypertension CARDIOLOGIST- DR BLANCA- WILL REQUEST LATE NOTE   DENIES S & S   Hypothyroidism    Insomnia    Left shoulder pain    Macular degeneration    MRSA infection    nasal treated more than 15 years ago   Normal nuclear stress test 01/25/2009   OA (osteoarthritis) JOINTS   Osteopenia    Osteoporosis    Painful orthopaedic hardware    left foot   Pneumonia    PONV (postoperative nausea and vomiting) SEVERE   Rotator cuff disorder LEFT SHOULDER RTC IMPINGMENT   Sleep apnea    mouth guard, no cpap    Sleep apnea in adult 06/26/2017   Spondylolisthesis of lumbar region    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   Wears glasses    Past Surgical History:  Procedure Laterality Date   BACK SURGERY     Dr Mavis- spondylosis   BILATERAL CARPAL TUNNEL RELEASE  1994   BILATERAL ELBOW SURG.  1999   BILATERAL SALPINGOOPHECTOMY  1993   POST-OP URETER  REPAIR 12 DAYS AFTER    CATARACT EXTRACTION, BILATERAL     COLONOSCOPY     EYE SURGERY     FOOT ARTHRODESIS Left 07/10/2016   Procedure: LEFT 2ND TARSAL METATARSAL ARTHRODESIS  GASTROC RECESSION LEFT [employer] MODIFIED MCBRIDE BUIONECTOMY;  Surgeon: Norleen Armor, MD;  Location: [facility];  Service: Orthopedics;  Laterality: Left;   GASTROC RECESSION EXTREMITY Left 07/10/2016   Procedure: GASTROC RECESSION EXTREMITY;  Surgeon: Norleen Armor, MD;  Location: [facility];  Service: Orthopedics;  Laterality: Left;   HARDWARE REMOVAL Left 05/20/2018   Procedure: Left foot removal of deep implants;  Surgeon: Armor Norleen, MD;  Location: [facility];  Service: Orthopedics;  Laterality: Left;    LEFT THUMB JOINT REPLACEMENT  2005   RIGHT DONE IN 2004   OOPHORECTOMY     POLYPECTOMY     RIGHT KNEE ARTHROSCOPY  X2  BEFORE 2011   RIGHT KNEE ARTHROSCOPY/ PARTIAL LATERAL MENISECTOMY/ TRICOMPARTMENT CHONDROPLASTY/ DECOMPRESSION CYST  10/26/2009   RIGHT KNEE CLOSED MANIPULATION  09/11/2010   RIGHT SHOULDER ARTHROSCOPY  2010  &  2004  TIBIA CYST REMOVED AND ORIF LEG FX  1958   TONSILLECTOMY  1968   TOTAL KNEE ARTHROPLASTY  07/16/2010   RIGHT   TOTAL KNEE ARTHROPLASTY Left 03/22/2024   Procedure: ARTHROPLASTY, KNEE, TOTAL;  Surgeon: Josefina Chew, MD;  Location: [facility];  Service: Orthopedics;  Laterality: Left;   TOTAL THYROIDECTOMY  2009   CANCER  (POST-OP BLEED)  AND RADIATION TX   trigger finger repair      07/2023   trigger fingers Right 2013   3rd and 4th fingers   UPPER GASTROINTESTINAL ENDOSCOPY     VAGINAL HYSTERECTOMY  1990   Patient Active Problem List   Diagnosis Date Noted   Primary hypertension 05/12/2024   Pain of right thumb 02/26/2024   Precordial pain 12/23/2023   Primary osteoarthritis of left knee with subchondral insufficiency fractures 09/23/2023   History of arthroplasty of right knee 07/03/2023   Primary osteoarthritis of right  shoulder 07/03/2023   Coronary artery disease involving native coronary artery of native heart without angina pectoris 05/25/2023   Coronary artery calcification 11/06/2022   Statin myopathy 11/06/2022   Frequent UTI 10/29/2022   Wart of hand 09/30/2022   Acute cystitis without hematuria 05/14/2021   Medial epicondylitis, right 03/25/2021   Trigger thumb, right thumb 11/19/2020   Right wrist pain 10/23/2020   Osteoporosis 04/05/2020   Trigger finger, left middle finger 07/27/2019   Chronic neck pain 11/10/2018   Orthopedic hardware present 02/17/2018   Controlled diabetes mellitus type 2 with complications (HCC) 12/15/2017   Intractable chronic migraine without aura and with status migrainosus 12/15/2017   Arthralgia 12/15/2017   Joint pain 12/15/2017   Migraine, transformed 12/15/2017   Chronic migraine without aura, with intractable migraine, so stated, with status migrainosus 12/15/2017   Pain of left shoulder joint on movement 10/19/2017   OSA (obstructive sleep apnea) 06/26/2017   Snoring 03/25/2016   Palpitations 03/25/2016   Headache 03/25/2016   Sensory disorder of trigeminal nerve 11/26/2015   Spondylolisthesis of lumbar region 08/09/2014   Lumbar adjacent segment disease with spondylolisthesis 08/09/2014   Back pain 02/17/2014   Macular degeneration 02/17/2014   Backache 02/17/2014   Other and unspecified hyperlipidemia 10/14/2013   Hyperlipidemia 10/14/2013   Onychomycosis 12/01/2012   Arthritis 04/12/2012   Hypothyroidism 04/12/2012    PCP: Copland, MD  REFERRING PROVIDER: Josefina, MD  REFERRING DIAG: s/p Left TKA  THERAPY DIAG:  Stiffness of left knee, not elsewhere classified  Difficulty in walking, not elsewhere classified  Acute pain of left knee  Localized edema  Rationale for Evaluation and Treatment: Rehabilitation  ONSET DATE: 03/22/24  SUBJECTIVE:   SUBJECTIVE STATEMENT:  Reports that she feels that one week we missed PT and she feel that  she regressed with her ROM.  She felt like she was stiffer, she also has a high rating of pain and reports that she feel it is neuropathic pain.  Reports pain is pretty constant  PAIN:  Are you having pain? Left knee 6-7/10  PRECAUTIONS: None  RED FLAGS: None   WEIGHT BEARING RESTRICTIONS: No  FALLS:  Has patient fallen in last 6 months? No  LIVING ENVIRONMENT: Lives with: lives with their family Lives in: House/apartment Stairs: Yes: Internal: 2 steps; can reach both Has following equipment at home: Single point cane, Walker - 2 wheeled, Crutches, and shower chair  OCCUPATION: retired  PLOF: Independent and shopping and housework  PATIENT GOALS: have normal walk without device, less pain  NEXT MD VISIT: 04/06/24  OBJECTIVE:  Note: Objective measures were completed at Evaluation unless otherwise noted.   COGNITION: Overall cognitive status: Within functional limits for tasks assessed     SENSATION: WFL  EDEMA:  Circumferential: mid patella 47 cm on the left, 40 cm on the right  MUSCLE LENGTH: Tight calves and HS  POSTURE: rounded shoulders and forward head  PALPATION: Tender, ecchymosis left thigh and knee, inner and posterior  LOWER EXTREMITY ROM:  Active ROM AROM LEft eval PROM Left eval LLE 04/01/24 AROM  LLE 04/04/24 04/26/24 04/29/24 05/03/24 AROM/PROM 06/29/24 AROM  Hip flexion          Hip extension          Hip abduction          Hip adduction          Hip internal rotation          Hip external rotation          Knee flexion 65 70 82 85 112 110 112/117 114  Knee extension 15 9 5 5  0 0 4/0 0  Ankle dorsiflexion          Ankle plantarflexion          Ankle inversion          Ankle eversion           (Blank rows = not tested)  LOWER EXTREMITY MMT:  MMT Right eval Left eval Left  06/29/24  Hip flexion     Hip extension     Hip abduction     Hip adduction     Hip internal rotation     Hip external rotation     Knee flexion  2 4-  Knee  extension  2 4-  Ankle dorsiflexion     Ankle plantarflexion     Ankle inversion     Ankle eversion      (Blank rows = not tested)  LOWER EXTREMITY SPECIAL TESTS:  Knee special tests: quad lag, ballotable patella  FUNCTIONAL TESTS:  Timed up and go (TUG): 17 seconds  GAIT: Distance walked: 60 feet Assistive device utilized: Environmental Consultant - 2 wheeled Level of assistance: Modified independence Comments: mild antalgic gait                                                                                                                                TREATMENT DATE:  06/29/24 Passive ROM/AROM STM to the left ITB, medial knee and quads Some scar and patellar mobs TKE in standing  06/21/24 Nustep L 5 Feet on ball K2C, rotation, small bridge, isometric abs Ball b/n knees squeeze SAQ Passive HS and ITB stretches STM to the ITB, the scar and the left glutes AROM 117 Green tband HS curl 2 sets 10 LAQ 2# 2 sets 10 Green tband TKE 2 sets 10   06/16/24 Feet on ball K2C, rotation, small bridge, isometric abs Ball b/n knees squeeze SAQ  Passive HS and ITB stretches STM to the ITB, the scar and the left glutes Nustep level 5 LE's x 5 mintues  06/09/24 TUG 9s  NuStep L5x3mins  LAQ 3# x10, x6 HS curls green 2x10 STW to quad and ITB, followed by passive flexion and extension Fitter pushes 1 blue band x10, 2 blue bands x10 STS 2x10 Calf stretch 20s x2 ROM flexion at end of session 119d  06/03/24 AROM SOC 110 LAQ 3 # 2 sets 10 Standing HS curl 3# 2 sets 10 Standing 3# SL hip flex,ext and abd 10 x  Nustep L 5 PROM flex and ext STW to left quad and ITB Resisted gait 20# 4 ways 5 x each AROM end of session  117  05/31/24 Belt mobs to left knee to increase capsular stretch Belt mobs for flexion PROM flex and ext STW to left quad and ITB ITB stretch in SL - limited tolerance lying on RT hip Nustep L 4 6 min Left knee MMT 4+ Left Knee AROM sitting 2-117 Green tband  HS curl 2 sets 10 3# LAQ 2 sets 10 6 inch step up fwd and laterally 10 x each Calf stretch Resisted gait laterally 20 # Worked on gait to increase stride , knee flexion in swing phase and decrease circumduction that will increase ITB pull  05/27/24 Bike partial revs x5 mins Calf stretch Resisted gait 20# 4 way x4 Step ups 6 STS 2x10 Bridges 2x5 Sidelying hip abduction 2x5 ROM- 117d  05/23/24 PROM to L knee  ROM- 115d flexion ITB stretch 30s x2 HS stretch 30s x2 SLR x10, then with abd x10  NuStep L5 x68mins LE  LAQ 3# 2x12 HS curls red 2x10 Hip abd 2x12 green Calf stretch 20s x2   05/17/24 STW to left knee and quad Jt mobs including patella Jt capsule stretch PROM flex and ext Bike L 4 Green tband HS 5x pain so decreased to red still pain so stopped STW to left lateral knee- very tender   05/13/24 STW to left knee, quad and incision with joint mobs PROM flex and ext with end range stretch AROM sitting after 0-120 Bike 6 min seat 12 L 4 Nustep L 5 6 min LE only Leg Press 30# 2 sets 10- seat 7, 6 too hard 3# LAQ 2 sets 10 Green tband HS curl 2 sets 10   05/10/24 ACT flexion  118 PROM and jt mobs flex and ext Nustep L 4 5 min Bike 5 min full rev 3# LAQ 2 sets 10 3# standing march,HS curl, SL 3 way 10x 6 in step up 10 x Red tband HS curl 2 sets 10 Leg press 30# 2 sets 10  05/06/24 PROM flex and ext HS curl 20# 10 x Knee ext 5# 10 x Bike 5 min full revolutions Nustep L 4 Left foot on dyna disc motions  LAQ 3# 10 x Standing 3# marching, hip abd, ext and HS curl 10 x each    05/03/24 Nustep level 4 x 6 minutes Bike seat 11 x 4 minutes full revolutions HS curls 20# Leg ext 5# Passive stretch Left LE Joint mobs Scar mobs Left foot on dyna disc motions  AROM 4-112 degrees PROM 0-117 degrees TUG 11 seconds without device  04/29/24 Recheck goals for 10th visit  NuStep L5 x17mins LE only  Bike seat 11 x97mins for ROM Leg ext 5#  2x10 HS curls 20# 2x10 STS 2x10 Leg press 20# 2x10 Step ups 4  04/26/24 PROM, stretching and STW Left knee and ankle/foot Nustep L 5 Func stepping multi directional for ROM Looked at gait but pt struggling d/t pain in foot and great toe AROM 0-112   PATIENT EDUCATION:  Education details: POC/HEP Person educated: Patient Education method: Explanation, Facilities Manager, and Handouts Education comprehension: verbalized understanding  HOME EXERCISE PROGRAM: Access Code: [email or web address] URL: [email or web address] Date: 04/11/2024 Prepared by: Almetta Fam  Exercises - Standing Knee Flexion Stretch on Step  - 3- x daily - 7 x weekly - 1 sets - 5-10 reps - 5-10 hold - Heel Raise on Step  - 3 x daily - 7 x weekly - 1 sets - 10 reps - Standing Gastroc Stretch on Step  - 3 x daily - 7 x weekly - 1 sets - 10 reps - Forward Step Touch  - 3 x daily - 7 x weekly - 1 sets - 10 reps - Seated Knee Flexion Slide  - 3 x daily - 7 x weekly - 1 sets - 10 reps - Sit to Stand  - 3 x daily - 7 x weekly - 1 sets - 10 reps  ASSESSMENT:  CLINICAL IMPRESSION:  Patient continues with progressing, she is walking without a device, she has AROM after some stretches that is 0-115 degrees flexion, pain is the biggest issue for her rated a 6-7/10 at most times.  We did skip a week of PT in the past to see if that would help pain but she felt like her ROM regressed and she was stiffer, she does come in and is stiff but is able to gain the ROM with passive stretching   Patient continues to report pain a 7/10 with the leg bent or in a dependent position.  She reports that if she elevates the leg up to even with the seating surface, she can have no pain she reports pain in the left buttock and the left ITB.  I did some STM to the areas, she is tight with knots in the ITB, quad and buttock.   Pt wants to continue therapy with slow progression to not cause increased pain so session tailored to her tolerance.  We discussed that her ROM is pretty good and her gait is pretty good sometimes reverts to stiff legged gait.  She reports that when she took a week off she feels that she lost ROM, we decided to see 1x/week.   OBJECTIVE IMPAIRMENTS: Abnormal gait, cardiopulmonary status limiting activity, decreased activity tolerance, decreased balance, decreased endurance, decreased mobility, difficulty walking, decreased ROM, decreased strength, increased edema, increased muscle spasms, impaired flexibility, impaired vision/preception, improper body mechanics, postural dysfunction, and pain.   REHAB POTENTIAL: Good  CLINICAL DECISION MAKING: Evolving/moderate complexity  EVALUATION COMPLEXITY: Moderate   GOALS: Goals reviewed with patient? Yes  SHORT TERM GOALS: Target date: 04/20/24 Independent with initial HEP Baseline: Goal status: 04/01/24 MET  LONG TERM GOALS: Target date: 06/29/24  Independent with advanced HEP Baseline:  Goal status: progressing12/3/25  2.  Independent with RICE Baseline:  Goal status: MET 04/14/24  3.  Decrease pain 50% for better function and quality of life Baseline:  Goal status: progressing 04/14/24, not 50% yet progressing 04/29/24 05/06/24 progressing  05/13/24 40-50% PROGRESSING 05/31/24 progressing   6/10 pain, ITB made it worse 06/16/24 and 06/29/24  4.  Decrease TUG to 12 seconds without device Baseline:  Goal status: 04/22/24 MET, 11 seconds without device 05/03/24, 9s MET 06/09/24  5.  Increase AROM of the left knee to  4-116 degrees flexion Baseline:  Goal status: 04/14/24 2-100 progressing  and 04/22/24, 0-110d 04/29/24 progressing 05/13/24 MET  6.  Go up and down steps step over step Baseline:  Goal status: 04/14/24 progressing, progressing 04/29/24 and 05/06/24  05/31/24 reverted back since ITB flare up, still has difficulty 06/16/24 and 06/21/24, 06/29/24   PLAN:  PT FREQUENCY: 2x/week  PT DURATION: 12 weeks  PLANNED INTERVENTIONS: 97164- PT  Re-evaluation, 97110-Therapeutic exercises, 97530- Therapeutic activity, 97112- Neuromuscular re-education, 97535- Self Care, [id number]- Manual therapy, [phone]- Gait training, [phone]- Electrical stimulation (unattended), 97016- Vasopneumatic device, Patient/Family education, Balance training, Stair training, Taping, Joint mobilization, and Cryotherapy  PLAN FOR NEXT SESSION: continue with our current plan of 1x/week to assure ROM and her function she reports that the massage helps the pain to some extent   Sonny Poth W, PT 06/29/2024, 11:07 AM

## 2024-06-29 NOTE — Patient Instructions (Signed)
 Add back jardiance  10 mg to your regimen- let me know if you are getting lows Keflex  500 2 or 3x daily for 5 days for your nose  Gertha Christmas!

## 2024-06-30 MED ORDER — PANTOPRAZOLE SODIUM 40 MG PO TBEC: 40.0000 mg | DELAYED_RELEASE_TABLET | Freq: Every day | ORAL | 3 refills | Status: AC

## 2024-06-30 NOTE — Addendum Note (Signed)
 Addended by: WATT RAISIN C on: 06/30/2024 12:32 PM   Modules accepted: Orders

## 2024-07-03 ENCOUNTER — Other Ambulatory Visit: Payer: Self-pay | Admitting: Family Medicine

## 2024-07-03 DIAGNOSIS — G4700 Insomnia, unspecified: Secondary | ICD-10-CM

## 2024-07-07 ENCOUNTER — Ambulatory Visit: Admitting: Physical Therapy

## 2024-07-07 DIAGNOSIS — R262 Difficulty in walking, not elsewhere classified: Secondary | ICD-10-CM

## 2024-07-07 DIAGNOSIS — M25562 Pain in left knee: Secondary | ICD-10-CM

## 2024-07-07 DIAGNOSIS — M25662 Stiffness of left knee, not elsewhere classified: Secondary | ICD-10-CM | POA: Diagnosis not present

## 2024-07-07 NOTE — Therapy (Signed)
 OUTPATIENT PHYSICAL THERAPY LOWER EXTREMITY       Patient Name: MAUDY YONAN MRN: [id number] DOB:11-18-1950, 73 y.o., female Today's Date: 07/07/2024  END OF SESSION:  PT End of Session - 07/07/24 0929     Visit Number 24    Date for Recertification  08/05/23    Authorization Type [employer] medicare    PT Start Time 0930    PT Stop Time 1010    PT Time Calculation (min) 40 min               Past Medical History:  Diagnosis Date   Allergy    Asthma    Cancer (HCC)    thyroid  cancer   Cataract    Chronic migraine BOTOX INJECTION EVERY 3 MONTHS   Chronic pain in right shoulder    Constipation    Coronary artery disease    Diabetes mellitus    Disorder of inner ear CAUSES VERTIGO OCCASIONALLY   Fibromyalgia    GERD (gastroesophageal reflux disease)    Hemorrhoid    History of bladder infections    History of thyroid  cancer 2009  S/P TOTAL THYROIDECTOMY AND RADIATION   Hyperlipidemia    Hypertension CARDIOLOGIST- DR BLANCA- WILL REQUEST LATE NOTE   DENIES S & S   Hypothyroidism    Insomnia    Left shoulder pain    Macular degeneration    MRSA infection    nasal treated more than 15 years ago   Normal nuclear stress test 01/25/2009   OA (osteoarthritis) JOINTS   Osteopenia    Osteoporosis    Painful orthopaedic hardware    left foot   Pneumonia    PONV (postoperative nausea and vomiting) SEVERE   Rotator cuff disorder LEFT SHOULDER RTC IMPINGMENT   Sleep apnea    mouth guard, no cpap    Sleep apnea in adult 06/26/2017   Spondylolisthesis of lumbar region    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   Wears glasses    Past Surgical History:  Procedure Laterality Date   BACK SURGERY     Dr Mavis- spondylosis   BILATERAL CARPAL TUNNEL RELEASE  1994   BILATERAL ELBOW SURG.  1999   BILATERAL SALPINGOOPHECTOMY  1993   POST-OP URETER REPAIR 12 DAYS AFTER    CATARACT EXTRACTION, BILATERAL     COLONOSCOPY     EYE SURGERY     FOOT ARTHRODESIS Left  07/10/2016   Procedure: LEFT 2ND TARSAL METATARSAL ARTHRODESIS  GASTROC RECESSION LEFT [employer] MODIFIED MCBRIDE BUIONECTOMY;  Surgeon: Norleen Armor, MD;  Location: [facility];  Service: Orthopedics;  Laterality: Left;   GASTROC RECESSION EXTREMITY Left 07/10/2016   Procedure: GASTROC RECESSION EXTREMITY;  Surgeon: Norleen Armor, MD;  Location: [facility];  Service: Orthopedics;  Laterality: Left;   HARDWARE REMOVAL Left 05/20/2018   Procedure: Left foot removal of deep implants;  Surgeon: Armor Norleen, MD;  Location: [facility];  Service: Orthopedics;  Laterality: Left;    LEFT THUMB JOINT REPLACEMENT  2005   RIGHT DONE IN 2004   OOPHORECTOMY     POLYPECTOMY     RIGHT KNEE ARTHROSCOPY  X2  BEFORE 2011   RIGHT KNEE ARTHROSCOPY/ PARTIAL LATERAL MENISECTOMY/ TRICOMPARTMENT CHONDROPLASTY/ DECOMPRESSION CYST  10/26/2009   RIGHT KNEE CLOSED MANIPULATION  09/11/2010   RIGHT SHOULDER ARTHROSCOPY  2010  &  2004   TIBIA CYST REMOVED AND ORIF LEG FX  1958   TONSILLECTOMY  1968   TOTAL  KNEE ARTHROPLASTY  07/16/2010   RIGHT   TOTAL KNEE ARTHROPLASTY Left 03/22/2024   Procedure: ARTHROPLASTY, KNEE, TOTAL;  Surgeon: Josefina Chew, MD;  Location: [facility];  Service: Orthopedics;  Laterality: Left;   TOTAL THYROIDECTOMY  2009   CANCER  (POST-OP BLEED)  AND RADIATION TX   trigger finger repair      07/2023   trigger fingers Right 2013   3rd and 4th fingers   UPPER GASTROINTESTINAL ENDOSCOPY     VAGINAL HYSTERECTOMY  1990   Patient Active Problem List   Diagnosis Date Noted   Primary hypertension 05/12/2024   Pain of right thumb 02/26/2024   Precordial pain 12/23/2023   Primary osteoarthritis of left knee with subchondral insufficiency fractures 09/23/2023   History of arthroplasty of right knee 07/03/2023   Primary osteoarthritis of right shoulder 07/03/2023   Coronary artery disease involving native coronary artery of native heart without angina  pectoris 05/25/2023   Coronary artery calcification 11/06/2022   Statin myopathy 11/06/2022   Frequent UTI 10/29/2022   Wart of hand 09/30/2022   Acute cystitis without hematuria 05/14/2021   Medial epicondylitis, right 03/25/2021   Trigger thumb, right thumb 11/19/2020   Right wrist pain 10/23/2020   Osteoporosis 04/05/2020   Trigger finger, left middle finger 07/27/2019   Chronic neck pain 11/10/2018   Orthopedic hardware present 02/17/2018   Controlled diabetes mellitus type 2 with complications (HCC) 12/15/2017   Intractable chronic migraine without aura and with status migrainosus 12/15/2017   Arthralgia 12/15/2017   Joint pain 12/15/2017   Migraine, transformed 12/15/2017   Chronic migraine without aura, with intractable migraine, so stated, with status migrainosus 12/15/2017   Pain of left shoulder joint on movement 10/19/2017   OSA (obstructive sleep apnea) 06/26/2017   Snoring 03/25/2016   Palpitations 03/25/2016   Headache 03/25/2016   Sensory disorder of trigeminal nerve 11/26/2015   Spondylolisthesis of lumbar region 08/09/2014   Lumbar adjacent segment disease with spondylolisthesis 08/09/2014   Back pain 02/17/2014   Macular degeneration 02/17/2014   Backache 02/17/2014   Other and unspecified hyperlipidemia 10/14/2013   Hyperlipidemia 10/14/2013   Onychomycosis 12/01/2012   Arthritis 04/12/2012   Hypothyroidism 04/12/2012    PCP: Watt, MD  REFERRING PROVIDER: Josefina, MD  REFERRING DIAG: s/p Left TKA  THERAPY DIAG:  Stiffness of left knee, not elsewhere classified  Difficulty in walking, not elsewhere classified  Acute pain of left knee  Rationale for Evaluation and Treatment: Rehabilitation  ONSET DATE: 03/22/24  SUBJECTIVE:   SUBJECTIVE STATEMENT:   saw MD last week and he was pleased, felt for the work done I am ahead of the game. Active ex vs just MT does not seem to change pain   PAIN:  Are you having pain? Left knee  6-7/10  PRECAUTIONS: None  RED FLAGS: None   WEIGHT BEARING RESTRICTIONS: No  FALLS:  Has patient fallen in last 6 months? No  LIVING ENVIRONMENT: Lives with: lives with their family Lives in: House/apartment Stairs: Yes: Internal: 2 steps; can reach both Has following equipment at home: Single point cane, Walker - 2 wheeled, Crutches, and shower chair  OCCUPATION: retired  PLOF: Independent and shopping and housework  PATIENT GOALS: have normal walk without device, less pain  NEXT MD VISIT: 04/06/24  OBJECTIVE:  Note: Objective measures were completed at Evaluation unless otherwise noted.   COGNITION: Overall cognitive status: Within functional limits for tasks assessed     SENSATION: WFL  EDEMA:  Circumferential: mid patella 47  cm on the left, 40 cm on the right  MUSCLE LENGTH: Tight calves and HS  POSTURE: rounded shoulders and forward head  PALPATION: Tender, ecchymosis left thigh and knee, inner and posterior  LOWER EXTREMITY ROM:  Active ROM AROM LEft eval PROM Left eval LLE 04/01/24 AROM  LLE 04/04/24 04/26/24 04/29/24 05/03/24 AROM/PROM 06/29/24 AROM  Hip flexion          Hip extension          Hip abduction          Hip adduction          Hip internal rotation          Hip external rotation          Knee flexion 65 70 82 85 112 110 112/117 114  Knee extension 15 9 5 5  0 0 4/0 0  Ankle dorsiflexion          Ankle plantarflexion          Ankle inversion          Ankle eversion           (Blank rows = not tested)  LOWER EXTREMITY MMT:  MMT Right eval Left eval Left  06/29/24  Hip flexion     Hip extension     Hip abduction     Hip adduction     Hip internal rotation     Hip external rotation     Knee flexion  2 4-  Knee extension  2 4-  Ankle dorsiflexion     Ankle plantarflexion     Ankle inversion     Ankle eversion      (Blank rows = not tested)  LOWER EXTREMITY SPECIAL TESTS:  Knee special tests: quad lag, ballotable  patella  FUNCTIONAL TESTS:  Timed up and go (TUG): 17 seconds  GAIT: Distance walked: 60 feet Assistive device utilized: Environmental Consultant - 2 wheeled Level of assistance: Modified independence Comments: mild antalgic gait                                                                                                                                TREATMENT DATE:   07/07/24 Passive ROM/AROM STM to the left ITB, medial knee and quads AROM 0-115 Seated hip flex 20 x alt 2 sets green tband Seated clams green tband 15 x 2 sets Bike L 3 full rev SAQ 2 sets 10 LAQ 3 # 10x Red tband HS curl 10 x with some pain Leg press 20# 2 sets 10  06/29/24 Passive ROM/AROM STM to the left ITB, medial knee and quads Some scar and patellar mobs TKE in standing  06/21/24 Nustep L 5 Feet on ball K2C, rotation, small bridge, isometric abs Ball b/n knees squeeze SAQ Passive HS and ITB stretches STM to the ITB, the scar and the left glutes AROM 117 Green tband HS curl 2 sets 10 LAQ 2#  2 sets 10 Green tband TKE 2 sets 10   06/16/24 Feet on ball K2C, rotation, small bridge, isometric abs Ball b/n knees squeeze SAQ Passive HS and ITB stretches STM to the ITB, the scar and the left glutes Nustep level 5 LE's x 5 mintues  06/09/24 TUG 9s  NuStep L5x63mins  LAQ 3# x10, x6 HS curls green 2x10 STW to quad and ITB, followed by passive flexion and extension Fitter pushes 1 blue band x10, 2 blue bands x10 STS 2x10 Calf stretch 20s x2 ROM flexion at end of session 119d  06/03/24 AROM SOC 110 LAQ 3 # 2 sets 10 Standing HS curl 3# 2 sets 10 Standing 3# SL hip flex,ext and abd 10 x  Nustep L 5 PROM flex and ext STW to left quad and ITB Resisted gait 20# 4 ways 5 x each AROM end of session  117  05/31/24 Belt mobs to left knee to increase capsular stretch Belt mobs for flexion PROM flex and ext STW to left quad and ITB ITB stretch in SL - limited tolerance lying on RT  hip Nustep L 4 6 min Left knee MMT 4+ Left Knee AROM sitting 2-117 Green tband HS curl 2 sets 10 3# LAQ 2 sets 10 6 inch step up fwd and laterally 10 x each Calf stretch Resisted gait laterally 20 # Worked on gait to increase stride , knee flexion in swing phase and decrease circumduction that will increase ITB pull  05/27/24 Bike partial revs x5 mins Calf stretch Resisted gait 20# 4 way x4 Step ups 6 STS 2x10 Bridges 2x5 Sidelying hip abduction 2x5 ROM- 117d  05/23/24 PROM to L knee  ROM- 115d flexion ITB stretch 30s x2 HS stretch 30s x2 SLR x10, then with abd x10  NuStep L5 x47mins LE  LAQ 3# 2x12 HS curls red 2x10 Hip abd 2x12 green Calf stretch 20s x2   05/17/24 STW to left knee and quad Jt mobs including patella Jt capsule stretch PROM flex and ext Bike L 4 Green tband HS 5x pain so decreased to red still pain so stopped STW to left lateral knee- very tender   05/13/24 STW to left knee, quad and incision with joint mobs PROM flex and ext with end range stretch AROM sitting after 0-120 Bike 6 min seat 12 L 4 Nustep L 5 6 min LE only Leg Press 30# 2 sets 10- seat 7, 6 too hard 3# LAQ 2 sets 10 Green tband HS curl 2 sets 10   05/10/24 ACT flexion  118 PROM and jt mobs flex and ext Nustep L 4 5 min Bike 5 min full rev 3# LAQ 2 sets 10 3# standing march,HS curl, SL 3 way 10x 6 in step up 10 x Red tband HS curl 2 sets 10 Leg press 30# 2 sets 10  05/06/24 PROM flex and ext HS curl 20# 10 x Knee ext 5# 10 x Bike 5 min full revolutions Nustep L 4 Left foot on dyna disc motions  LAQ 3# 10 x Standing 3# marching, hip abd, ext and HS curl 10 x each    05/03/24 Nustep level 4 x 6 minutes Bike seat 11 x 4 minutes full revolutions HS curls 20# Leg ext 5# Passive stretch Left LE Joint mobs Scar mobs Left foot on dyna disc motions  AROM 4-112 degrees PROM 0-117 degrees TUG 11 seconds without device  04/29/24 Recheck goals for 10th  visit  NuStep L5 x50mins LE  only  Bike seat 11 x56mins for ROM Leg ext 5# 2x10 HS curls 20# 2x10 STS 2x10 Leg press 20# 2x10 Step ups 4   04/26/24 PROM, stretching and STW Left knee and ankle/foot Nustep L 5 Func stepping multi directional for ROM Looked at gait but pt struggling d/t pain in foot and great toe AROM 0-112   PATIENT EDUCATION:  Education details: POC/HEP Person educated: Patient Education method: Explanation, Facilities Manager, and Handouts Education comprehension: verbalized understanding  HOME EXERCISE PROGRAM: Access Code: [email or web address] URL: [email or web address] Date: 04/11/2024 Prepared by: Almetta Fam  Exercises - Standing Knee Flexion Stretch on Step  - 3- x daily - 7 x weekly - 1 sets - 5-10 reps - 5-10 hold - Heel Raise on Step  - 3 x daily - 7 x weekly - 1 sets - 10 reps - Standing Gastroc Stretch on Step  - 3 x daily - 7 x weekly - 1 sets - 10 reps - Forward Step Touch  - 3 x daily - 7 x weekly - 1 sets - 10 reps - Seated Knee Flexion Slide  - 3 x daily - 7 x weekly - 1 sets - 10 reps - Sit to Stand  - 3 x daily - 7 x weekly - 1 sets - 10 reps  ASSESSMENT:  CLINICAL IMPRESSION:  saw MD last week and he was pleased, felt for the work done I am ahead of the game. Active ex vs just MT does not seem to change pain per pt so we re started some light ex and she tolerated well, some pain with HS curl. AROM and MMT are very good, noted some left hip weakness.     Patient continues to report pain a 7/10 with the leg bent or in a dependent position.  She reports that if she elevates the leg up to even with the seating surface, she can have no pain she reports pain in the left buttock and the left ITB.  I did some STM to the areas, she is tight with knots in the ITB, quad and buttock.   Pt wants to continue therapy with slow progression to not cause increased pain so session tailored to her tolerance. We discussed that her ROM is pretty good and her  gait is pretty good sometimes reverts to stiff legged gait.  She reports that when she took a week off she feels that she lost ROM, we decided to see 1x/week.   OBJECTIVE IMPAIRMENTS: Abnormal gait, cardiopulmonary status limiting activity, decreased activity tolerance, decreased balance, decreased endurance, decreased mobility, difficulty walking, decreased ROM, decreased strength, increased edema, increased muscle spasms, impaired flexibility, impaired vision/preception, improper body mechanics, postural dysfunction, and pain.   REHAB POTENTIAL: Good  CLINICAL DECISION MAKING: Evolving/moderate complexity  EVALUATION COMPLEXITY: Moderate   GOALS: Goals reviewed with patient? Yes  SHORT TERM GOALS: Target date: 04/20/24 Independent with initial HEP Baseline: Goal status: 04/01/24 MET  LONG TERM GOALS: Target date: 06/29/24  Independent with advanced HEP Baseline:  Goal status: progressing12/3/25  2.  Independent with RICE Baseline:  Goal status: MET 04/14/24  3.  Decrease pain 50% for better function and quality of life Baseline:  Goal status: progressing 04/14/24, not 50% yet progressing 04/29/24 05/06/24 progressing  05/13/24 40-50% PROGRESSING 05/31/24 progressing   6/10 pain, ITB made it worse 06/16/24 and  07/07/24  4.  Decrease TUG to 12 seconds without device Baseline:  Goal status: 04/22/24 MET, 11 seconds without device 05/03/24, 9s MET 06/09/24  5.  Increase AROM of the left knee to 4-116 degrees flexion Baseline:  Goal status: 04/14/24 2-100 progressing  and 04/22/24, 0-110d 04/29/24 progressing 05/13/24 MET  6.  Go up and down steps step over step Baseline:  Goal status: 04/14/24 progressing, progressing 04/29/24 and 05/06/24  05/31/24 reverted back since ITB flare up, still has difficulty 06/16/24 and 06/21/24, 06/29/24   PLAN:  PT FREQUENCY: 2x/week  PT DURATION: 12 weeks  PLANNED INTERVENTIONS: 97164- PT Re-evaluation, 97110-Therapeutic exercises, 97530-  Therapeutic activity, 97112- Neuromuscular re-education, 97535- Self Care, [id number]- Manual therapy, [phone]- Gait training, [phone]- Electrical stimulation (unattended), 97016- Vasopneumatic device, Patient/Family education, Balance training, Stair training, Taping, Joint mobilization, and Cryotherapy  PLAN FOR NEXT SESSION: continue with our current plan of 1x/week to assure ROM and her function she reports that the massage helps the pain to some extent   Yarenis Cerino,ANGIE, PTA 07/07/2024, 9:29 AM

## 2024-07-12 ENCOUNTER — Ambulatory Visit: Admitting: Physical Therapy

## 2024-07-12 DIAGNOSIS — M25662 Stiffness of left knee, not elsewhere classified: Secondary | ICD-10-CM | POA: Diagnosis not present

## 2024-07-12 DIAGNOSIS — M25562 Pain in left knee: Secondary | ICD-10-CM

## 2024-07-12 NOTE — Therapy (Signed)
 OUTPATIENT PHYSICAL THERAPY LOWER EXTREMITY       Patient Name: Debbie Hayes MRN: [id number] DOB:04/23/1951, 73 y.o., female Today's Date: 07/12/2024  END OF SESSION:  PT End of Session - 07/12/24 1055     Visit Number 25    Date for Recertification  08/05/23    Authorization Type [employer] medicare    PT Start Time 1055    PT Stop Time 1136    PT Time Calculation (min) 41 min               Past Medical History:  Diagnosis Date   Allergy    Asthma    Cancer (HCC)    thyroid  cancer   Cataract    Chronic migraine BOTOX INJECTION EVERY 3 MONTHS   Chronic pain in right shoulder    Constipation    Coronary artery disease    Diabetes mellitus    Disorder of inner ear CAUSES VERTIGO OCCASIONALLY   Fibromyalgia    GERD (gastroesophageal reflux disease)    Hemorrhoid    History of bladder infections    History of thyroid  cancer 2009  S/P TOTAL THYROIDECTOMY AND RADIATION   Hyperlipidemia    Hypertension CARDIOLOGIST- DR BLANCA- WILL REQUEST LATE NOTE   DENIES S & S   Hypothyroidism    Insomnia    Left shoulder pain    Macular degeneration    MRSA infection    nasal treated more than 15 years ago   Normal nuclear stress test 01/25/2009   OA (osteoarthritis) JOINTS   Osteopenia    Osteoporosis    Painful orthopaedic hardware    left foot   Pneumonia    PONV (postoperative nausea and vomiting) SEVERE   Rotator cuff disorder LEFT SHOULDER RTC IMPINGMENT   Sleep apnea    mouth guard, no cpap    Sleep apnea in adult 06/26/2017   Spondylolisthesis of lumbar region    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   Wears glasses    Past Surgical History:  Procedure Laterality Date   BACK SURGERY     Dr Mavis- spondylosis   BILATERAL CARPAL TUNNEL RELEASE  1994   BILATERAL ELBOW SURG.  1999   BILATERAL SALPINGOOPHECTOMY  1993   POST-OP URETER REPAIR 12 DAYS AFTER    CATARACT EXTRACTION, BILATERAL     COLONOSCOPY     EYE SURGERY     FOOT ARTHRODESIS Left  07/10/2016   Procedure: LEFT 2ND TARSAL METATARSAL ARTHRODESIS  GASTROC RECESSION LEFT [employer] MODIFIED MCBRIDE BUIONECTOMY;  Surgeon: Norleen Armor, MD;  Location: [facility];  Service: Orthopedics;  Laterality: Left;   GASTROC RECESSION EXTREMITY Left 07/10/2016   Procedure: GASTROC RECESSION EXTREMITY;  Surgeon: Norleen Armor, MD;  Location: [facility];  Service: Orthopedics;  Laterality: Left;   HARDWARE REMOVAL Left 05/20/2018   Procedure: Left foot removal of deep implants;  Surgeon: Armor Norleen, MD;  Location: [facility];  Service: Orthopedics;  Laterality: Left;    LEFT THUMB JOINT REPLACEMENT  2005   RIGHT DONE IN 2004   OOPHORECTOMY     POLYPECTOMY     RIGHT KNEE ARTHROSCOPY  X2  BEFORE 2011   RIGHT KNEE ARTHROSCOPY/ PARTIAL LATERAL MENISECTOMY/ TRICOMPARTMENT CHONDROPLASTY/ DECOMPRESSION CYST  10/26/2009   RIGHT KNEE CLOSED MANIPULATION  09/11/2010   RIGHT SHOULDER ARTHROSCOPY  2010  &  2004   TIBIA CYST REMOVED AND ORIF LEG FX  1958   TONSILLECTOMY  1968   TOTAL  KNEE ARTHROPLASTY  07/16/2010   RIGHT   TOTAL KNEE ARTHROPLASTY Left 03/22/2024   Procedure: ARTHROPLASTY, KNEE, TOTAL;  Surgeon: Josefina Chew, MD;  Location: [facility];  Service: Orthopedics;  Laterality: Left;   TOTAL THYROIDECTOMY  2009   CANCER  (POST-OP BLEED)  AND RADIATION TX   trigger finger repair      07/2023   trigger fingers Right 2013   3rd and 4th fingers   UPPER GASTROINTESTINAL ENDOSCOPY     VAGINAL HYSTERECTOMY  1990   Patient Active Problem List   Diagnosis Date Noted   Primary hypertension 05/12/2024   Pain of right thumb 02/26/2024   Precordial pain 12/23/2023   Primary osteoarthritis of left knee with subchondral insufficiency fractures 09/23/2023   History of arthroplasty of right knee 07/03/2023   Primary osteoarthritis of right shoulder 07/03/2023   Coronary artery disease involving native coronary artery of native heart without angina  pectoris 05/25/2023   Coronary artery calcification 11/06/2022   Statin myopathy 11/06/2022   Frequent UTI 10/29/2022   Wart of hand 09/30/2022   Acute cystitis without hematuria 05/14/2021   Medial epicondylitis, right 03/25/2021   Trigger thumb, right thumb 11/19/2020   Right wrist pain 10/23/2020   Osteoporosis 04/05/2020   Trigger finger, left middle finger 07/27/2019   Chronic neck pain 11/10/2018   Orthopedic hardware present 02/17/2018   Controlled diabetes mellitus type 2 with complications (HCC) 12/15/2017   Intractable chronic migraine without aura and with status migrainosus 12/15/2017   Arthralgia 12/15/2017   Joint pain 12/15/2017   Migraine, transformed 12/15/2017   Chronic migraine without aura, with intractable migraine, so stated, with status migrainosus 12/15/2017   Pain of left shoulder joint on movement 10/19/2017   OSA (obstructive sleep apnea) 06/26/2017   Snoring 03/25/2016   Palpitations 03/25/2016   Headache 03/25/2016   Sensory disorder of trigeminal nerve 11/26/2015   Spondylolisthesis of lumbar region 08/09/2014   Lumbar adjacent segment disease with spondylolisthesis 08/09/2014   Back pain 02/17/2014   Macular degeneration 02/17/2014   Backache 02/17/2014   Other and unspecified hyperlipidemia 10/14/2013   Hyperlipidemia 10/14/2013   Onychomycosis 12/01/2012   Arthritis 04/12/2012   Hypothyroidism 04/12/2012    PCP: Watt, MD  REFERRING PROVIDER: Josefina, MD  REFERRING DIAG: s/p Left TKA  THERAPY DIAG:  Stiffness of left knee, not elsewhere classified  Acute pain of left knee  Rationale for Evaluation and Treatment: Rehabilitation  ONSET DATE: 03/22/24  SUBJECTIVE:   SUBJECTIVE STATEMENT:    Did well with ex, no changes in pain.   PAIN:  Are you having pain? Left knee 6-7/10  PRECAUTIONS: None  RED FLAGS: None   WEIGHT BEARING RESTRICTIONS: No  FALLS:  Has patient fallen in last 6 months? No  LIVING ENVIRONMENT: Lives  with: lives with their family Lives in: House/apartment Stairs: Yes: Internal: 2 steps; can reach both Has following equipment at home: Single point cane, Walker - 2 wheeled, Crutches, and shower chair  OCCUPATION: retired  PLOF: Independent and shopping and housework  PATIENT GOALS: have normal walk without device, less pain  NEXT MD VISIT: 04/06/24  OBJECTIVE:  Note: Objective measures were completed at Evaluation unless otherwise noted.   COGNITION: Overall cognitive status: Within functional limits for tasks assessed     SENSATION: WFL  EDEMA:  Circumferential: mid patella 47 cm on the left, 40 cm on the right  MUSCLE LENGTH: Tight calves and HS  POSTURE: rounded shoulders and forward head  PALPATION: Tender, ecchymosis left  thigh and knee, inner and posterior  LOWER EXTREMITY ROM:  Active ROM AROM LEft eval PROM Left eval LLE 04/01/24 AROM  LLE 04/04/24 04/26/24 04/29/24 05/03/24 AROM/PROM 06/29/24 AROM  Hip flexion          Hip extension          Hip abduction          Hip adduction          Hip internal rotation          Hip external rotation          Knee flexion 65 70 82 85 112 110 112/117 114  Knee extension 15 9 5 5  0 0 4/0 0  Ankle dorsiflexion          Ankle plantarflexion          Ankle inversion          Ankle eversion           (Blank rows = not tested)  LOWER EXTREMITY MMT:  MMT Right eval Left eval Left  06/29/24  Hip flexion     Hip extension     Hip abduction     Hip adduction     Hip internal rotation     Hip external rotation     Knee flexion  2 4-  Knee extension  2 4-  Ankle dorsiflexion     Ankle plantarflexion     Ankle inversion     Ankle eversion      (Blank rows = not tested)  LOWER EXTREMITY SPECIAL TESTS:  Knee special tests: quad lag, ballotable patella  FUNCTIONAL TESTS:  Timed up and go (TUG): 17 seconds  GAIT: Distance walked: 60 feet Assistive device utilized: Walker - 2 wheeled Level of assistance:  Modified independence Comments: mild antalgic gait                                                                                                                                TREATMENT DATE:   07/12/24 Passive ROM/AROM STM to the left ITB, medial knee and quads Bike L 4 6 min full rev Leg press 20# 2 sets 10. Calf raises 20# 8x then stopped d/t pain Seated hip flex 20 x alt 2 sets green tband Seated clams green tband 15 x 2 sets #3 LAQ 2 sets 10 3# standing HS curl and marchin 10 x each     07/07/24 Passive ROM/AROM STM to the left ITB, medial knee and quads AROM 0-115 Seated hip flex 20 x alt 2 sets green tband Seated clams green tband 15 x 2 sets Bike L 3 full rev SAQ 2 sets 10 LAQ 3 # 10x Red tband HS curl 10 x with some pain Leg press 20# 2 sets 10  06/29/24 Passive ROM/AROM STM to the left ITB, medial knee and quads Some scar and patellar mobs TKE in standing  06/21/24 Nustep L 5  Feet on ball K2C, rotation, small bridge, isometric abs Ball b/n knees squeeze SAQ Passive HS and ITB stretches STM to the ITB, the scar and the left glutes AROM 117 Green tband HS curl 2 sets 10 LAQ 2# 2 sets 10 Green tband TKE 2 sets 10   06/16/24 Feet on ball K2C, rotation, small bridge, isometric abs Ball b/n knees squeeze SAQ Passive HS and ITB stretches STM to the ITB, the scar and the left glutes Nustep level 5 LE's x 5 mintues  06/09/24 TUG 9s  NuStep L5x31mins  LAQ 3# x10, x6 HS curls green 2x10 STW to quad and ITB, followed by passive flexion and extension Fitter pushes 1 blue band x10, 2 blue bands x10 STS 2x10 Calf stretch 20s x2 ROM flexion at end of session 119d  06/03/24 AROM SOC 110 LAQ 3 # 2 sets 10 Standing HS curl 3# 2 sets 10 Standing 3# SL hip flex,ext and abd 10 x  Nustep L 5 PROM flex and ext STW to left quad and ITB Resisted gait 20# 4 ways 5 x each AROM end of session  117  05/31/24 Belt mobs to left knee to increase  capsular stretch Belt mobs for flexion PROM flex and ext STW to left quad and ITB ITB stretch in SL - limited tolerance lying on RT hip Nustep L 4 6 min Left knee MMT 4+ Left Knee AROM sitting 2-117 Green tband HS curl 2 sets 10 3# LAQ 2 sets 10 6 inch step up fwd and laterally 10 x each Calf stretch Resisted gait laterally 20 # Worked on gait to increase stride , knee flexion in swing phase and decrease circumduction that will increase ITB pull  05/27/24 Bike partial revs x5 mins Calf stretch Resisted gait 20# 4 way x4 Step ups 6 STS 2x10 Bridges 2x5 Sidelying hip abduction 2x5 ROM- 117d  05/23/24 PROM to L knee  ROM- 115d flexion ITB stretch 30s x2 HS stretch 30s x2 SLR x10, then with abd x10  NuStep L5 x67mins LE  LAQ 3# 2x12 HS curls red 2x10 Hip abd 2x12 green Calf stretch 20s x2   05/17/24 STW to left knee and quad Jt mobs including patella Jt capsule stretch PROM flex and ext Bike L 4 Green tband HS 5x pain so decreased to red still pain so stopped STW to left lateral knee- very tender   05/13/24 STW to left knee, quad and incision with joint mobs PROM flex and ext with end range stretch AROM sitting after 0-120 Bike 6 min seat 12 L 4 Nustep L 5 6 min LE only Leg Press 30# 2 sets 10- seat 7, 6 too hard 3# LAQ 2 sets 10 Green tband HS curl 2 sets 10   05/10/24 ACT flexion  118 PROM and jt mobs flex and ext Nustep L 4 5 min Bike 5 min full rev 3# LAQ 2 sets 10 3# standing march,HS curl, SL 3 way 10x 6 in step up 10 x Red tband HS curl 2 sets 10 Leg press 30# 2 sets 10  05/06/24 PROM flex and ext HS curl 20# 10 x Knee ext 5# 10 x Bike 5 min full revolutions Nustep L 4 Left foot on dyna disc motions  LAQ 3# 10 x Standing 3# marching, hip abd, ext and HS curl 10 x each    05/03/24 Nustep level 4 x 6 minutes Bike seat 11 x 4 minutes full revolutions HS curls 20# Leg  ext 5# Passive stretch Left LE Joint mobs Scar  mobs Left foot on dyna disc motions  AROM 4-112 degrees PROM 0-117 degrees TUG 11 seconds without device  04/29/24 Recheck goals for 10th visit  NuStep L5 x42mins LE only  Bike seat 11 x53mins for ROM Leg ext 5# 2x10 HS curls 20# 2x10 STS 2x10 Leg press 20# 2x10 Step ups 4   04/26/24 PROM, stretching and STW Left knee and ankle/foot Nustep L 5 Func stepping multi directional for ROM Looked at gait but pt struggling d/t pain in foot and great toe AROM 0-112   PATIENT EDUCATION:  Education details: POC/HEP Person educated: Patient Education method: Explanation, Facilities Manager, and Handouts Education comprehension: verbalized understanding  HOME EXERCISE PROGRAM: Access Code: [email or web address] URL: [email or web address] Date: 04/11/2024 Prepared by: Almetta Fam  Exercises - Standing Knee Flexion Stretch on Step  - 3- x daily - 7 x weekly - 1 sets - 5-10 reps - 5-10 hold - Heel Raise on Step  - 3 x daily - 7 x weekly - 1 sets - 10 reps - Standing Gastroc Stretch on Step  - 3 x daily - 7 x weekly - 1 sets - 10 reps - Forward Step Touch  - 3 x daily - 7 x weekly - 1 sets - 10 reps - Seated Knee Flexion Slide  - 3 x daily - 7 x weekly - 1 sets - 10 reps - Sit to Stand  - 3 x daily - 7 x weekly - 1 sets - 10 reps  ASSESSMENT:  CLINICAL IMPRESSION:  pnt arrived and stated pain no better or worse with ex so is good to continue light ex. Tolerated well. Calf raises increased pain and tried green tband for HS curls but increased pain. Func ROM is very good.     OBJECTIVE IMPAIRMENTS: Abnormal gait, cardiopulmonary status limiting activity, decreased activity tolerance, decreased balance, decreased endurance, decreased mobility, difficulty walking, decreased ROM, decreased strength, increased edema, increased muscle spasms, impaired flexibility, impaired vision/preception, improper body mechanics, postural dysfunction, and pain.   REHAB POTENTIAL: Good  CLINICAL  DECISION MAKING: Evolving/moderate complexity  EVALUATION COMPLEXITY: Moderate   GOALS: Goals reviewed with patient? Yes  SHORT TERM GOALS: Target date: 04/20/24 Independent with initial HEP Baseline: Goal status: 04/01/24 MET  LONG TERM GOALS: Target date: 06/29/24  Independent with advanced HEP Baseline:  Goal status: progressing12/3/25  2.  Independent with RICE Baseline:  Goal status: MET 04/14/24  3.  Decrease pain 50% for better function and quality of life Baseline:  Goal status: progressing 04/14/24, not 50% yet progressing 04/29/24 05/06/24 progressing  05/13/24 40-50% PROGRESSING 05/31/24 progressing   6/10 pain, ITB made it worse 06/16/24 and  07/07/24  4.  Decrease TUG to 12 seconds without device Baseline:  Goal status: 04/22/24 MET, 11 seconds without device 05/03/24, 9s MET 06/09/24  5.  Increase AROM of the left knee to 4-116 degrees flexion Baseline:  Goal status: 04/14/24 2-100 progressing  and 04/22/24, 0-110d 04/29/24 progressing 05/13/24 MET  6.  Go up and down steps step over step Baseline:  Goal status: 04/14/24 progressing, progressing 04/29/24 and 05/06/24  05/31/24 reverted back since ITB flare up, still has difficulty 06/16/24 and 06/21/24, 06/29/24   PLAN:  PT FREQUENCY: 2x/week  PT DURATION: 12 weeks  PLANNED INTERVENTIONS: 97164- PT Re-evaluation, 97110-Therapeutic exercises, 97530- Therapeutic activity, 97112- Neuromuscular re-education, 97535- Self Care, [id number]- Manual therapy, 97116- Gait training, [phone]- Electrical stimulation (unattended), 97016- Vasopneumatic device, Patient/Family education, Balance training, Stair  training, Taping, Joint mobilization, and Cryotherapy  PLAN FOR NEXT SESSION: continue with our current plan of 1x/week to assure ROM and her function she reports that the massage helps the pain to some extent   Lundyn Coste,ANGIE, PTA 07/12/2024, 11:37 AM

## 2024-07-13 MED ORDER — OXYCODONE HCL 10 MG PO TABS: ORAL_TABLET | ORAL | 0 refills | Status: AC

## 2024-07-13 MED ORDER — GABAPENTIN 300 MG PO CAPS: 300.0000 mg | ORAL_CAPSULE | Freq: Three times a day (TID) | ORAL | 1 refills | Status: AC

## 2024-07-18 ENCOUNTER — Telehealth: Payer: Self-pay | Admitting: Family Medicine

## 2024-07-18 NOTE — Telephone Encounter (Signed)
 Copied from CRM #[id number]. Topic: General - Other >> Jul 15, 2024  4:21 PM Alexandria E wrote: Reason for CRM: Adina, pharmacist with [employer], called in stating that he is faxing over a claims adjustment form. If there are any questions please reach out to him at [phone] ext [phone].

## 2024-07-19 ENCOUNTER — Encounter: Payer: Self-pay | Admitting: Physical Therapy

## 2024-07-19 ENCOUNTER — Ambulatory Visit: Admitting: Physical Therapy

## 2024-07-19 DIAGNOSIS — M25662 Stiffness of left knee, not elsewhere classified: Secondary | ICD-10-CM

## 2024-07-19 DIAGNOSIS — R262 Difficulty in walking, not elsewhere classified: Secondary | ICD-10-CM

## 2024-07-19 DIAGNOSIS — R600 Localized edema: Secondary | ICD-10-CM

## 2024-07-19 DIAGNOSIS — M25562 Pain in left knee: Secondary | ICD-10-CM

## 2024-07-19 NOTE — Therapy (Signed)
 " OUTPATIENT PHYSICAL THERAPY LOWER EXTREMITY       Patient Name: Debbie Hayes MRN: [id number] DOB:[birth year], 73 y.o., female Today's Date: 07/19/2024  END OF SESSION:  PT End of Session - 07/19/24 1533     Visit Number 26    Date for Recertification  08/05/23    Authorization Type [employer] medicare    PT Start Time 1529    PT Stop Time 1613    PT Time Calculation (min) 44 min    Activity Tolerance Patient tolerated treatment well    Behavior During Therapy WFL for tasks assessed/performed               Past Medical History:  Diagnosis Date   Allergy    Asthma    Cancer (HCC)    thyroid  cancer   Cataract    Chronic migraine BOTOX INJECTION EVERY 3 MONTHS   Chronic pain in right shoulder    Constipation    Coronary artery disease    Diabetes mellitus    Disorder of inner ear CAUSES VERTIGO OCCASIONALLY   Fibromyalgia    GERD (gastroesophageal reflux disease)    Hemorrhoid    History of bladder infections    History of thyroid  cancer 2009  S/P TOTAL THYROIDECTOMY AND RADIATION   Hyperlipidemia    Hypertension CARDIOLOGIST- DR BLANCA- WILL REQUEST LATE NOTE   DENIES S & S   Hypothyroidism    Insomnia    Left shoulder pain    Macular degeneration    MRSA infection    nasal treated more than 15 years ago   Normal nuclear stress test 01/25/2009   OA (osteoarthritis) JOINTS   Osteopenia    Osteoporosis    Painful orthopaedic hardware    left foot   Pneumonia    PONV (postoperative nausea and vomiting) SEVERE   Rotator cuff disorder LEFT SHOULDER RTC IMPINGMENT   Sleep apnea    mouth guard, no cpap    Sleep apnea in adult 06/26/2017   Spondylolisthesis of lumbar region    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   Wears glasses    Past Surgical History:  Procedure Laterality Date   BACK SURGERY     Dr Mavis- spondylosis   BILATERAL CARPAL TUNNEL RELEASE  1994   BILATERAL ELBOW SURG.  1999   BILATERAL SALPINGOOPHECTOMY  1993   POST-OP URETER  REPAIR 12 DAYS AFTER    CATARACT EXTRACTION, BILATERAL     COLONOSCOPY     EYE SURGERY     FOOT ARTHRODESIS Left 07/10/2016   Procedure: LEFT 2ND TARSAL METATARSAL ARTHRODESIS  GASTROC RECESSION LEFT [employer] MODIFIED MCBRIDE BUIONECTOMY;  Surgeon: Norleen Armor, MD;  Location: [facility];  Service: Orthopedics;  Laterality: Left;   GASTROC RECESSION EXTREMITY Left 07/10/2016   Procedure: GASTROC RECESSION EXTREMITY;  Surgeon: Norleen Armor, MD;  Location: [facility];  Service: Orthopedics;  Laterality: Left;   HARDWARE REMOVAL Left 05/20/2018   Procedure: Left foot removal of deep implants;  Surgeon: Armor Norleen, MD;  Location: [facility];  Service: Orthopedics;  Laterality: Left;    LEFT THUMB JOINT REPLACEMENT  2005   RIGHT DONE IN 2004   OOPHORECTOMY     POLYPECTOMY     RIGHT KNEE ARTHROSCOPY  X2  BEFORE 2011   RIGHT KNEE ARTHROSCOPY/ PARTIAL LATERAL MENISECTOMY/ TRICOMPARTMENT CHONDROPLASTY/ DECOMPRESSION CYST  10/26/2009   RIGHT KNEE CLOSED MANIPULATION  09/11/2010   RIGHT SHOULDER ARTHROSCOPY  2010  &  2004   TIBIA CYST REMOVED AND ORIF LEG FX  1958   TONSILLECTOMY  1968   TOTAL KNEE ARTHROPLASTY  07/16/2010   RIGHT   TOTAL KNEE ARTHROPLASTY Left 03/22/2024   Procedure: ARTHROPLASTY, KNEE, TOTAL;  Surgeon: Josefina Chew, MD;  Location: [facility];  Service: Orthopedics;  Laterality: Left;   TOTAL THYROIDECTOMY  2009   CANCER  (POST-OP BLEED)  AND RADIATION TX   trigger finger repair      07/2023   trigger fingers Right 2013   3rd and 4th fingers   UPPER GASTROINTESTINAL ENDOSCOPY     VAGINAL HYSTERECTOMY  1990   Patient Active Problem List   Diagnosis Date Noted   Primary hypertension 05/12/2024   Pain of right thumb 02/26/2024   Precordial pain 12/23/2023   Primary osteoarthritis of left knee with subchondral insufficiency fractures 09/23/2023   History of arthroplasty of right knee 07/03/2023   Primary osteoarthritis of right  shoulder 07/03/2023   Coronary artery disease involving native coronary artery of native heart without angina pectoris 05/25/2023   Coronary artery calcification 11/06/2022   Statin myopathy 11/06/2022   Frequent UTI 10/29/2022   Wart of hand 09/30/2022   Acute cystitis without hematuria 05/14/2021   Medial epicondylitis, right 03/25/2021   Trigger thumb, right thumb 11/19/2020   Right wrist pain 10/23/2020   Osteoporosis 04/05/2020   Trigger finger, left middle finger 07/27/2019   Chronic neck pain 11/10/2018   Orthopedic hardware present 02/17/2018   Controlled diabetes mellitus type 2 with complications (HCC) 12/15/2017   Intractable chronic migraine without aura and with status migrainosus 12/15/2017   Arthralgia 12/15/2017   Joint pain 12/15/2017   Migraine, transformed 12/15/2017   Chronic migraine without aura, with intractable migraine, so stated, with status migrainosus 12/15/2017   Pain of left shoulder joint on movement 10/19/2017   OSA (obstructive sleep apnea) 06/26/2017   Snoring 03/25/2016   Palpitations 03/25/2016   Headache 03/25/2016   Sensory disorder of trigeminal nerve 11/26/2015   Spondylolisthesis of lumbar region 08/09/2014   Lumbar adjacent segment disease with spondylolisthesis 08/09/2014   Back pain 02/17/2014   Macular degeneration 02/17/2014   Backache 02/17/2014   Other and unspecified hyperlipidemia 10/14/2013   Hyperlipidemia 10/14/2013   Onychomycosis 12/01/2012   Arthritis 04/12/2012   Hypothyroidism 04/12/2012    PCP: Copland, MD  REFERRING PROVIDER: Josefina, MD  REFERRING DIAG: s/p Left TKA  THERAPY DIAG:  Stiffness of left knee, not elsewhere classified  Acute pain of left knee  Difficulty in walking, not elsewhere classified  Localized edema  Rationale for Evaluation and Treatment: Rehabilitation  ONSET DATE: 03/22/24  SUBJECTIVE:   SUBJECTIVE STATEMENT:    I am doing well with the ROM, I just really have pain all the  time, reports saw surgeon about 2 weeks ago, felt like she was doing well and that it just takes time  PAIN:  Are you having pain? Left knee 6-7/10  PRECAUTIONS: None  RED FLAGS: None   WEIGHT BEARING RESTRICTIONS: No  FALLS:  Has patient fallen in last 6 months? No  LIVING ENVIRONMENT: Lives with: lives with their family Lives in: House/apartment Stairs: Yes: Internal: 2 steps; can reach both Has following equipment at home: Single point cane, Walker - 2 wheeled, Crutches, and shower chair  OCCUPATION: retired  PLOF: Independent and shopping and housework  PATIENT GOALS: have normal walk without device, less pain  NEXT MD VISIT: 04/06/24  OBJECTIVE:  Note: Objective measures were completed at Evaluation unless  otherwise noted.   COGNITION: Overall cognitive status: Within functional limits for tasks assessed     SENSATION: WFL  EDEMA:  Circumferential: mid patella 47 cm on the left, 40 cm on the right  MUSCLE LENGTH: Tight calves and HS  POSTURE: rounded shoulders and forward head  PALPATION: Tender, ecchymosis left thigh and knee, inner and posterior  LOWER EXTREMITY ROM:  Active ROM AROM LEft eval PROM Left eval LLE 04/01/24 AROM  LLE 04/04/24 04/26/24 04/29/24 05/03/24 AROM/PROM 06/29/24 AROM 07/19/24 AROM  Hip flexion           Hip extension           Hip abduction           Hip adduction           Hip internal rotation           Hip external rotation           Knee flexion 65 70 82 85 112 110 112/117 114 115  Knee extension 15 9 5 5  0 0 4/0 0 0  Ankle dorsiflexion           Ankle plantarflexion           Ankle inversion           Ankle eversion            (Blank rows = not tested)  LOWER EXTREMITY MMT:  MMT Right eval Left eval Left  06/29/24  Hip flexion     Hip extension     Hip abduction     Hip adduction     Hip internal rotation     Hip external rotation     Knee flexion  2 4-  Knee extension  2 4-  Ankle dorsiflexion      Ankle plantarflexion     Ankle inversion     Ankle eversion      (Blank rows = not tested)  LOWER EXTREMITY SPECIAL TESTS:  Knee special tests: quad lag, ballotable patella  FUNCTIONAL TESTS:  Timed up and go (TUG): 17 seconds  GAIT: Distance walked: 60 feet Assistive device utilized: Environmental Consultant - 2 wheeled Level of assistance: Modified independence Comments: mild antalgic gait                                                                                                                                TREATMENT DATE:  07/19/24 Bike level 4 x 6 minutes Calf stretch Leg press 20#, no weight for ROM 15# HS curls Passive stretch Flexion and extension STM to the scar, quad and ITB area and into the lateral calf  07/12/24 Passive ROM/AROM STM to the left ITB, medial knee and quads Bike L 4 6 min full rev Leg press 20# 2 sets 10. Calf raises 20# 8x then stopped d/t pain Seated hip flex 20 x alt 2 sets green tband Seated clams green tband 15 x 2  sets #3 LAQ 2 sets 10 3# standing HS curl and marchin 10 x each  07/07/24 Passive ROM/AROM STM to the left ITB, medial knee and quads AROM 0-115 Seated hip flex 20 x alt 2 sets green tband Seated clams green tband 15 x 2 sets Bike L 3 full rev SAQ 2 sets 10 LAQ 3 # 10x Red tband HS curl 10 x with some pain Leg press 20# 2 sets 10  06/29/24 Passive ROM/AROM STM to the left ITB, medial knee and quads Some scar and patellar mobs TKE in standing  06/21/24 Nustep L 5 Feet on ball K2C, rotation, small bridge, isometric abs Ball b/n knees squeeze SAQ Passive HS and ITB stretches STM to the ITB, the scar and the left glutes AROM 117 Green tband HS curl 2 sets 10 LAQ 2# 2 sets 10 Green tband TKE 2 sets 10   06/16/24 Feet on ball K2C, rotation, small bridge, isometric abs Ball b/n knees squeeze SAQ Passive HS and ITB stretches STM to the ITB, the scar and the left glutes Nustep level 5 LE's x 5  mintues  06/09/24 TUG 9s  NuStep L5x50mins  LAQ 3# x10, x6 HS curls green 2x10 STW to quad and ITB, followed by passive flexion and extension Fitter pushes 1 blue band x10, 2 blue bands x10 STS 2x10 Calf stretch 20s x2 ROM flexion at end of session 119d  06/03/24 AROM SOC 110 LAQ 3 # 2 sets 10 Standing HS curl 3# 2 sets 10 Standing 3# SL hip flex,ext and abd 10 x  Nustep L 5 PROM flex and ext STW to left quad and ITB Resisted gait 20# 4 ways 5 x each AROM end of session  117  05/31/24 Belt mobs to left knee to increase capsular stretch Belt mobs for flexion PROM flex and ext STW to left quad and ITB ITB stretch in SL - limited tolerance lying on RT hip Nustep L 4 6 min Left knee MMT 4+ Left Knee AROM sitting 2-117 Green tband HS curl 2 sets 10 3# LAQ 2 sets 10 6 inch step up fwd and laterally 10 x each Calf stretch Resisted gait laterally 20 # Worked on gait to increase stride , knee flexion in swing phase and decrease circumduction that will increase ITB pull  05/27/24 Bike partial revs x5 mins Calf stretch Resisted gait 20# 4 way x4 Step ups 6 STS 2x10 Bridges 2x5 Sidelying hip abduction 2x5 ROM- 117d  05/23/24 PROM to L knee  ROM- 115d flexion ITB stretch 30s x2 HS stretch 30s x2 SLR x10, then with abd x10  NuStep L5 x61mins LE  LAQ 3# 2x12 HS curls red 2x10 Hip abd 2x12 green Calf stretch 20s x2   05/17/24 STW to left knee and quad Jt mobs including patella Jt capsule stretch PROM flex and ext Bike L 4 Green tband HS 5x pain so decreased to red still pain so stopped STW to left lateral knee- very tender   05/13/24 STW to left knee, quad and incision with joint mobs PROM flex and ext with end range stretch AROM sitting after 0-120 Bike 6 min seat 12 L 4 Nustep L 5 6 min LE only Leg Press 30# 2 sets 10- seat 7, 6 too hard 3# LAQ 2 sets 10 Green tband HS curl 2 sets 10   05/10/24 ACT flexion  118 PROM and jt mobs flex and  ext Nustep L 4 5 min Bike 5 min  full rev 3# LAQ 2 sets 10 3# standing march,HS curl, SL 3 way 10x 6 in step up 10 x Red tband HS curl 2 sets 10 Leg press 30# 2 sets 10  05/06/24 PROM flex and ext HS curl 20# 10 x Knee ext 5# 10 x Bike 5 min full revolutions Nustep L 4 Left foot on dyna disc motions  LAQ 3# 10 x Standing 3# marching, hip abd, ext and HS curl 10 x each    05/03/24 Nustep level 4 x 6 minutes Bike seat 11 x 4 minutes full revolutions HS curls 20# Leg ext 5# Passive stretch Left LE Joint mobs Scar mobs Left foot on dyna disc motions  AROM 4-112 degrees PROM 0-117 degrees TUG 11 seconds without device  04/29/24 Recheck goals for 10th visit  NuStep L5 x53mins LE only  Bike seat 11 x44mins for ROM Leg ext 5# 2x10 HS curls 20# 2x10 STS 2x10 Leg press 20# 2x10 Step ups 4   04/26/24 PROM, stretching and STW Left knee and ankle/foot Nustep L 5 Func stepping multi directional for ROM Looked at gait but pt struggling d/t pain in foot and great toe AROM 0-112   PATIENT EDUCATION:  Education details: POC/HEP Person educated: Patient Education method: Explanation, Facilities Manager, and Handouts Education comprehension: verbalized understanding  HOME EXERCISE PROGRAM: Access Code: [email or web address] URL: [email or web address] Date: 04/11/2024 Prepared by: Almetta Fam  Exercises - Standing Knee Flexion Stretch on Step  - 3- x daily - 7 x weekly - 1 sets - 5-10 reps - 5-10 hold - Heel Raise on Step  - 3 x daily - 7 x weekly - 1 sets - 10 reps - Standing Gastroc Stretch on Step  - 3 x daily - 7 x weekly - 1 sets - 10 reps - Forward Step Touch  - 3 x daily - 7 x weekly - 1 sets - 10 reps - Seated Knee Flexion Slide  - 3 x daily - 7 x weekly - 1 sets - 10 reps - Sit to Stand  - 3 x daily - 7 x weekly - 1 sets - 10 reps  ASSESSMENT:  CLINICAL IMPRESSION:  Patient with good ROM, good gait, still slow with some limp on the left.  Pain is the  biggest issue.  She is taking some nerve pain medication and trying, I did talk with her today about a TENS unit and suggested that she could try, gave some electrodes as she reports that she has a TENS at home     OBJECTIVE IMPAIRMENTS: Abnormal gait, cardiopulmonary status limiting activity, decreased activity tolerance, decreased balance, decreased endurance, decreased mobility, difficulty walking, decreased ROM, decreased strength, increased edema, increased muscle spasms, impaired flexibility, impaired vision/preception, improper body mechanics, postural dysfunction, and pain.   REHAB POTENTIAL: Good  CLINICAL DECISION MAKING: Evolving/moderate complexity  EVALUATION COMPLEXITY: Moderate   GOALS: Goals reviewed with patient? Yes  SHORT TERM GOALS: Target date: 04/20/24 Independent with initial HEP Baseline: Goal status: 04/01/24 MET  LONG TERM GOALS: Target date: 06/29/24  Independent with advanced HEP Baseline:  Goal status: progressing12/3/25  2.  Independent with RICE Baseline:  Goal status: MET 04/14/24  3.  Decrease pain 50% for better function and quality of life Baseline:  Goal status: progressing 04/14/24, not 50% yet progressing 04/29/24 05/06/24 progressing  05/13/24 40-50% PROGRESSING 05/31/24 progressing   6/10 pain, ITB made it worse 06/16/24 and  07/07/24, 07/19/24  4.  Decrease TUG to 12 seconds without device Baseline:  Goal status: 04/22/24 MET, 11 seconds without device 05/03/24, 9s MET 06/09/24  5.  Increase AROM of the left knee to 4-116 degrees flexion Baseline:  Goal status: 04/14/24 2-100 progressing  and 04/22/24, 0-110d 04/29/24 progressing 05/13/24 MET  6.  Go up and down steps step over step Baseline:  Goal status: 04/14/24 progressing, progressing 04/29/24 and 05/06/24  05/31/24 reverted back since ITB flare up, still has difficulty 06/16/24 and 06/21/24, 06/29/24, 07/19/24   PLAN:  PT FREQUENCY: 2x/week  PT DURATION: 12 weeks  PLANNED  INTERVENTIONS: 97164- PT Re-evaluation, 97110-Therapeutic exercises, 97530- Therapeutic activity, 97112- Neuromuscular re-education, 97535- Self Care, [id number]- Manual therapy, [phone]- Gait training, [phone]- Electrical stimulation (unattended), 97016- Vasopneumatic device, Patient/Family education, Balance training, Stair training, Taping, Joint mobilization, and Cryotherapy  PLAN FOR NEXT SESSION: continue with our current plan of 1x/week to assure ROM and her function she reports that the massage helps the pain to some extent   Kyriaki Moder W, PT 07/19/2024, 3:35 PM  "

## 2024-07-19 NOTE — Telephone Encounter (Signed)
 Noted

## 2024-07-25 ENCOUNTER — Telehealth: Payer: Self-pay | Admitting: Neurology

## 2024-07-25 NOTE — Telephone Encounter (Signed)
 Pt called to cancel appt  due to conflict   Apt Canceled

## 2024-07-26 ENCOUNTER — Ambulatory Visit: Admitting: Physical Therapy

## 2024-07-26 ENCOUNTER — Encounter: Payer: Self-pay | Admitting: Physical Therapy

## 2024-07-26 DIAGNOSIS — M25662 Stiffness of left knee, not elsewhere classified: Secondary | ICD-10-CM | POA: Diagnosis not present

## 2024-07-26 DIAGNOSIS — R600 Localized edema: Secondary | ICD-10-CM

## 2024-07-26 DIAGNOSIS — M25562 Pain in left knee: Secondary | ICD-10-CM

## 2024-07-26 DIAGNOSIS — R262 Difficulty in walking, not elsewhere classified: Secondary | ICD-10-CM

## 2024-07-26 NOTE — Therapy (Signed)
 " OUTPATIENT PHYSICAL THERAPY LOWER EXTREMITY       Patient Name: Debbie Hayes MRN: [id number] DOB:Apr 22, 1951, 73 y.o., female Today's Date: 07/26/2024  END OF SESSION:  PT End of Session - 07/26/24 1102     Visit Number 27    Date for Recertification  08/05/23    Authorization Type [employer] medicare    PT Start Time 1100    PT Stop Time 1145    PT Time Calculation (min) 45 min    Activity Tolerance Patient tolerated treatment well    Behavior During Therapy WFL for tasks assessed/performed               Past Medical History:  Diagnosis Date   Allergy    Asthma    Cancer (HCC)    thyroid  cancer   Cataract    Chronic migraine BOTOX INJECTION EVERY 3 MONTHS   Chronic pain in right shoulder    Constipation    Coronary artery disease    Diabetes mellitus    Disorder of inner ear CAUSES VERTIGO OCCASIONALLY   Fibromyalgia    GERD (gastroesophageal reflux disease)    Hemorrhoid    History of bladder infections    History of thyroid  cancer 2009  S/P TOTAL THYROIDECTOMY AND RADIATION   Hyperlipidemia    Hypertension CARDIOLOGIST- DR BLANCA- WILL REQUEST LATE NOTE   DENIES S & S   Hypothyroidism    Insomnia    Left shoulder pain    Macular degeneration    MRSA infection    nasal treated more than 15 years ago   Normal nuclear stress test 01/25/2009   OA (osteoarthritis) JOINTS   Osteopenia    Osteoporosis    Painful orthopaedic hardware    left foot   Pneumonia    PONV (postoperative nausea and vomiting) SEVERE   Rotator cuff disorder LEFT SHOULDER RTC IMPINGMENT   Sleep apnea    mouth guard, no cpap    Sleep apnea in adult 06/26/2017   Spondylolisthesis of lumbar region    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   Wears glasses    Past Surgical History:  Procedure Laterality Date   BACK SURGERY     Dr Mavis- spondylosis   BILATERAL CARPAL TUNNEL RELEASE  1994   BILATERAL ELBOW SURG.  1999   BILATERAL SALPINGOOPHECTOMY  1993   POST-OP URETER  REPAIR 12 DAYS AFTER    CATARACT EXTRACTION, BILATERAL     COLONOSCOPY     EYE SURGERY     FOOT ARTHRODESIS Left 07/10/2016   Procedure: LEFT 2ND TARSAL METATARSAL ARTHRODESIS  GASTROC RECESSION LEFT [employer] MODIFIED MCBRIDE BUIONECTOMY;  Surgeon: Norleen Armor, MD;  Location: [facility];  Service: Orthopedics;  Laterality: Left;   GASTROC RECESSION EXTREMITY Left 07/10/2016   Procedure: GASTROC RECESSION EXTREMITY;  Surgeon: Norleen Armor, MD;  Location: [facility];  Service: Orthopedics;  Laterality: Left;   HARDWARE REMOVAL Left 05/20/2018   Procedure: Left foot removal of deep implants;  Surgeon: Armor Norleen, MD;  Location: [facility];  Service: Orthopedics;  Laterality: Left;    LEFT THUMB JOINT REPLACEMENT  2005   RIGHT DONE IN 2004   OOPHORECTOMY     POLYPECTOMY     RIGHT KNEE ARTHROSCOPY  X2  BEFORE 2011   RIGHT KNEE ARTHROSCOPY/ PARTIAL LATERAL MENISECTOMY/ TRICOMPARTMENT CHONDROPLASTY/ DECOMPRESSION CYST  10/26/2009   RIGHT KNEE CLOSED MANIPULATION  09/11/2010   RIGHT SHOULDER ARTHROSCOPY  2010  &  2004   TIBIA CYST REMOVED AND ORIF LEG FX  1958   TONSILLECTOMY  1968   TOTAL KNEE ARTHROPLASTY  07/16/2010   RIGHT   TOTAL KNEE ARTHROPLASTY Left 03/22/2024   Procedure: ARTHROPLASTY, KNEE, TOTAL;  Surgeon: Josefina Chew, MD;  Location: [facility];  Service: Orthopedics;  Laterality: Left;   TOTAL THYROIDECTOMY  2009   CANCER  (POST-OP BLEED)  AND RADIATION TX   trigger finger repair      07/2023   trigger fingers Right 2013   3rd and 4th fingers   UPPER GASTROINTESTINAL ENDOSCOPY     VAGINAL HYSTERECTOMY  1990   Patient Active Problem List   Diagnosis Date Noted   Primary hypertension 05/12/2024   Pain of right thumb 02/26/2024   Precordial pain 12/23/2023   Primary osteoarthritis of left knee with subchondral insufficiency fractures 09/23/2023   History of arthroplasty of right knee 07/03/2023   Primary osteoarthritis of right  shoulder 07/03/2023   Coronary artery disease involving native coronary artery of native heart without angina pectoris 05/25/2023   Coronary artery calcification 11/06/2022   Statin myopathy 11/06/2022   Frequent UTI 10/29/2022   Wart of hand 09/30/2022   Acute cystitis without hematuria 05/14/2021   Medial epicondylitis, right 03/25/2021   Trigger thumb, right thumb 11/19/2020   Right wrist pain 10/23/2020   Osteoporosis 04/05/2020   Trigger finger, left middle finger 07/27/2019   Chronic neck pain 11/10/2018   Orthopedic hardware present 02/17/2018   Controlled diabetes mellitus type 2 with complications (HCC) 12/15/2017   Intractable chronic migraine without aura and with status migrainosus 12/15/2017   Arthralgia 12/15/2017   Joint pain 12/15/2017   Migraine, transformed 12/15/2017   Chronic migraine without aura, with intractable migraine, so stated, with status migrainosus 12/15/2017   Pain of left shoulder joint on movement 10/19/2017   OSA (obstructive sleep apnea) 06/26/2017   Snoring 03/25/2016   Palpitations 03/25/2016   Headache 03/25/2016   Sensory disorder of trigeminal nerve 11/26/2015   Spondylolisthesis of lumbar region 08/09/2014   Lumbar adjacent segment disease with spondylolisthesis 08/09/2014   Back pain 02/17/2014   Macular degeneration 02/17/2014   Backache 02/17/2014   Other and unspecified hyperlipidemia 10/14/2013   Hyperlipidemia 10/14/2013   Onychomycosis 12/01/2012   Arthritis 04/12/2012   Hypothyroidism 04/12/2012    PCP: Copland, MD  REFERRING PROVIDER: Josefina, MD  REFERRING DIAG: s/p Left TKA  THERAPY DIAG:  Stiffness of left knee, not elsewhere classified  Acute pain of left knee  Difficulty in walking, not elsewhere classified  Localized edema  Rationale for Evaluation and Treatment: Rehabilitation  ONSET DATE: 03/22/24  SUBJECTIVE:   SUBJECTIVE STATEMENT:    I am doing okay, I am trying the TENS.  PAIN:  Are you having  pain? Left knee 6-7/10  PRECAUTIONS: None  RED FLAGS: None   WEIGHT BEARING RESTRICTIONS: No  FALLS:  Has patient fallen in last 6 months? No  LIVING ENVIRONMENT: Lives with: lives with their family Lives in: House/apartment Stairs: Yes: Internal: 2 steps; can reach both Has following equipment at home: Single point cane, Walker - 2 wheeled, Crutches, and shower chair  OCCUPATION: retired  PLOF: Independent and shopping and housework  PATIENT GOALS: have normal walk without device, less pain  NEXT MD VISIT: 04/06/24  OBJECTIVE:  Note: Objective measures were completed at Evaluation unless otherwise noted.   COGNITION: Overall cognitive status: Within functional limits for tasks assessed     SENSATION: WFL  EDEMA:  Circumferential: mid  patella 47 cm on the left, 40 cm on the right  MUSCLE LENGTH: Tight calves and HS  POSTURE: rounded shoulders and forward head  PALPATION: Tender, ecchymosis left thigh and knee, inner and posterior  LOWER EXTREMITY ROM:  Active ROM AROM LEft eval PROM Left eval LLE 04/01/24 AROM  LLE 04/04/24 04/26/24 04/29/24 05/03/24 AROM/PROM 06/29/24 AROM 07/19/24 AROM  Hip flexion           Hip extension           Hip abduction           Hip adduction           Hip internal rotation           Hip external rotation           Knee flexion 65 70 82 85 112 110 112/117 114 115  Knee extension 15 9 5 5  0 0 4/0 0 0  Ankle dorsiflexion           Ankle plantarflexion           Ankle inversion           Ankle eversion            (Blank rows = not tested)  LOWER EXTREMITY MMT:  MMT Right eval Left eval Left  06/29/24  Hip flexion     Hip extension     Hip abduction     Hip adduction     Hip internal rotation     Hip external rotation     Knee flexion  2 4-  Knee extension  2 4-  Ankle dorsiflexion     Ankle plantarflexion     Ankle inversion     Ankle eversion      (Blank rows = not tested)  LOWER EXTREMITY SPECIAL TESTS:   Knee special tests: quad lag, ballotable patella  FUNCTIONAL TESTS:  Timed up and go (TUG): 17 seconds  GAIT: Distance walked: 60 feet Assistive device utilized: Environmental Consultant - 2 wheeled Level of assistance: Modified independence Comments: mild antalgic gait                                                                                                                                TREATMENT DATE:  07/26/24 Bike level 3 x 6 minutes Feet on ball K2C, rotation, small bridge, isometric abs Calf stretches 15# HS curls Passive stretch STM to the left thigh, ITB, scar and medial knee  07/19/24 Bike level 4 x 6 minutes Calf stretch Leg press 20#, no weight for ROM 15# HS curls Passive stretch Flexion and extension STM to the scar, quad and ITB area and into the lateral calf  07/12/24 Passive ROM/AROM STM to the left ITB, medial knee and quads Bike L 4 6 min full rev Leg press 20# 2 sets 10. Calf raises 20# 8x then stopped d/t pain Seated hip flex 20 x alt 2 sets  green tband Seated clams green tband 15 x 2 sets #3 LAQ 2 sets 10 3# standing HS curl and marchin 10 x each  07/07/24 Passive ROM/AROM STM to the left ITB, medial knee and quads AROM 0-115 Seated hip flex 20 x alt 2 sets green tband Seated clams green tband 15 x 2 sets Bike L 3 full rev SAQ 2 sets 10 LAQ 3 # 10x Red tband HS curl 10 x with some pain Leg press 20# 2 sets 10  06/29/24 Passive ROM/AROM STM to the left ITB, medial knee and quads Some scar and patellar mobs TKE in standing  06/21/24 Nustep L 5 Feet on ball K2C, rotation, small bridge, isometric abs Ball b/n knees squeeze SAQ Passive HS and ITB stretches STM to the ITB, the scar and the left glutes AROM 117 Green tband HS curl 2 sets 10 LAQ 2# 2 sets 10 Green tband TKE 2 sets 10   06/16/24 Feet on ball K2C, rotation, small bridge, isometric abs Ball b/n knees squeeze SAQ Passive HS and ITB stretches STM to the ITB, the scar  and the left glutes Nustep level 5 LE's x 5 mintues  06/09/24 TUG 9s  NuStep L5x87mins  LAQ 3# x10, x6 HS curls green 2x10 STW to quad and ITB, followed by passive flexion and extension Fitter pushes 1 blue band x10, 2 blue bands x10 STS 2x10 Calf stretch 20s x2 ROM flexion at end of session 119d  06/03/24 AROM SOC 110 LAQ 3 # 2 sets 10 Standing HS curl 3# 2 sets 10 Standing 3# SL hip flex,ext and abd 10 x  Nustep L 5 PROM flex and ext STW to left quad and ITB Resisted gait 20# 4 ways 5 x each AROM end of session  117  05/31/24 Belt mobs to left knee to increase capsular stretch Belt mobs for flexion PROM flex and ext STW to left quad and ITB ITB stretch in SL - limited tolerance lying on RT hip Nustep L 4 6 min Left knee MMT 4+ Left Knee AROM sitting 2-117 Green tband HS curl 2 sets 10 3# LAQ 2 sets 10 6 inch step up fwd and laterally 10 x each Calf stretch Resisted gait laterally 20 # Worked on gait to increase stride , knee flexion in swing phase and decrease circumduction that will increase ITB pull  05/27/24 Bike partial revs x5 mins Calf stretch Resisted gait 20# 4 way x4 Step ups 6 STS 2x10 Bridges 2x5 Sidelying hip abduction 2x5 ROM- 117d   PATIENT EDUCATION:  Education details: POC/HEP Person educated: Patient Education method: Programmer, Multimedia, Facilities Manager, and Handouts Education comprehension: verbalized understanding  HOME EXERCISE PROGRAM: Access Code: [email or web address] URL: [email or web address] Date: 04/11/2024 Prepared by: Almetta Fam  Exercises - Standing Knee Flexion Stretch on Step  - 3- x daily - 7 x weekly - 1 sets - 5-10 reps - 5-10 hold - Heel Raise on Step  - 3 x daily - 7 x weekly - 1 sets - 10 reps - Standing Gastroc Stretch on Step  - 3 x daily - 7 x weekly - 1 sets - 10 reps - Forward Step Touch  - 3 x daily - 7 x weekly - 1 sets - 10 reps - Seated Knee Flexion Slide  - 3 x daily - 7 x weekly - 1 sets - 10 reps -  Sit to Stand  - 3 x daily - 7 x weekly - 1 sets - 10 reps  ASSESSMENT:  CLINICAL IMPRESSION:  As stated last note: Patient with good ROM, good gait, still slow with some limp on the left.  Pain is the biggest issue.  She reported finding TENS and has tried it, not sure it helps, we discussed trying to intercept the signal and help pain for a period of time, discussed that it was not healing or taking the pain permanently away.  She is tight in the quad and ITB    OBJECTIVE IMPAIRMENTS: Abnormal gait, cardiopulmonary status limiting activity, decreased activity tolerance, decreased balance, decreased endurance, decreased mobility, difficulty walking, decreased ROM, decreased strength, increased edema, increased muscle spasms, impaired flexibility, impaired vision/preception, improper body mechanics, postural dysfunction, and pain.   REHAB POTENTIAL: Good  CLINICAL DECISION MAKING: Evolving/moderate complexity  EVALUATION COMPLEXITY: Moderate   GOALS: Goals reviewed with patient? Yes  SHORT TERM GOALS: Target date: 04/20/24 Independent with initial HEP Baseline: Goal status: 04/01/24 MET  LONG TERM GOALS: Target date: 06/29/24  Independent with advanced HEP Baseline:  Goal status: progressing12/3/25  2.  Independent with RICE Baseline:  Goal status: MET 04/14/24  3.  Decrease pain 50% for better function and quality of life Baseline:  Goal status: progressing 04/14/24, not 50% yet progressing 04/29/24 05/06/24 progressing  05/13/24 40-50% PROGRESSING 05/31/24 progressing   6/10 pain, ITB made it worse 06/16/24 and  07/07/24, 07/19/24, 07/26/24  4.  Decrease TUG to 12 seconds without device Baseline:  Goal status: 04/22/24 MET, 11 seconds without device 05/03/24, 9s MET 06/09/24  5.  Increase AROM of the left knee to 4-116 degrees flexion Baseline:  Goal status: 04/14/24 2-100 progressing  and 04/22/24, 0-110d 04/29/24 progressing 05/13/24 MET  6.  Go up and down steps step over  step Baseline:  Goal status: 04/14/24 progressing, progressing 04/29/24 and 05/06/24  05/31/24 reverted back since ITB flare up, still has difficulty 06/16/24 and 06/21/24, 06/29/24, 07/19/24, 07/26/24   PLAN:  PT FREQUENCY: 2x/week  PT DURATION: 12 weeks  PLANNED INTERVENTIONS: 97164- PT Re-evaluation, 97110-Therapeutic exercises, 97530- Therapeutic activity, 97112- Neuromuscular re-education, 97535- Self Care, 02859- Manual therapy, [phone]- Gait training, [phone]- Electrical stimulation (unattended), 97016- Vasopneumatic device, Patient/Family education, Balance training, Stair training, Taping, Joint mobilization, and Cryotherapy  PLAN FOR NEXT SESSION: continue with our current plan of 1x/week to assure ROM and her function she reports that the massage helps the pain to some extent   Dedria Endres W, PT 07/26/2024, 11:05 AM  "

## 2024-08-01 ENCOUNTER — Ambulatory Visit: Admitting: Neurology

## 2024-08-01 ENCOUNTER — Other Ambulatory Visit: Payer: Self-pay | Admitting: Family Medicine

## 2024-08-01 DIAGNOSIS — J4531 Mild persistent asthma with (acute) exacerbation: Secondary | ICD-10-CM

## 2024-08-01 DIAGNOSIS — E118 Type 2 diabetes mellitus with unspecified complications: Secondary | ICD-10-CM

## 2024-08-01 DIAGNOSIS — G43711 Chronic migraine without aura, intractable, with status migrainosus: Secondary | ICD-10-CM

## 2024-08-01 MED ORDER — DEXCOM G7 15 DAY SENSOR MISC: 1.0000 | 3 refills | Status: AC

## 2024-08-01 MED ORDER — FLUTICASONE-SALMETEROL 100-50 MCG/ACT IN AEPB: 1.0000 | INHALATION_SPRAY | Freq: Two times a day (BID) | RESPIRATORY_TRACT | 3 refills | Status: AC

## 2024-08-01 MED ORDER — FLUCONAZOLE 150 MG PO TABS: 150.0000 mg | ORAL_TABLET | Freq: Once | ORAL | 0 refills | Status: AC

## 2024-08-04 ENCOUNTER — Ambulatory Visit: Payer: Medicare (Managed Care) | Attending: Family Medicine | Admitting: Physical Therapy

## 2024-08-04 ENCOUNTER — Telehealth: Payer: Self-pay | Admitting: Cardiology

## 2024-08-04 DIAGNOSIS — M25562 Pain in left knee: Secondary | ICD-10-CM | POA: Insufficient documentation

## 2024-08-04 DIAGNOSIS — M25662 Stiffness of left knee, not elsewhere classified: Secondary | ICD-10-CM | POA: Diagnosis present

## 2024-08-04 DIAGNOSIS — R600 Localized edema: Secondary | ICD-10-CM | POA: Diagnosis present

## 2024-08-04 DIAGNOSIS — R262 Difficulty in walking, not elsewhere classified: Secondary | ICD-10-CM | POA: Insufficient documentation

## 2024-08-04 NOTE — Telephone Encounter (Signed)
 Copied from CRM #[id number]. Topic: General - Other >> Aug 04, 2024  4:21 PM Drema MATSU wrote: Reason for CRM: Katheryn with [employer] Pharmacist will be sending a fax for provider's review signature and fax back requesting permission to add diagnosis code to a existing claim.

## 2024-08-04 NOTE — Addendum Note (Signed)
 Addended by: OBADIAH OZELL ORN on: 08/04/2024 02:46 PM   Modules accepted: Orders

## 2024-08-04 NOTE — Telephone Encounter (Signed)
 Have not received yet, will continue to monitor.

## 2024-08-04 NOTE — Therapy (Signed)
 " OUTPATIENT PHYSICAL THERAPY LOWER EXTREMITY       Patient Name: Debbie Hayes MRN: [id number] DOB:May 04, 1951, 74 y.o., female Today's Date: 08/04/2024  END OF SESSION:  PT End of Session - 08/04/24 1358     Visit Number 28    Date for Recertification  09/02/24    Authorization Type [employer] medicare    PT Start Time 1358    PT Stop Time 1440    PT Time Calculation (min) 42 min               Past Medical History:  Diagnosis Date   Allergy    Asthma    Cancer (HCC)    thyroid  cancer   Cataract    Chronic migraine BOTOX INJECTION EVERY 3 MONTHS   Chronic pain in right shoulder    Constipation    Coronary artery disease    Diabetes mellitus    Disorder of inner ear CAUSES VERTIGO OCCASIONALLY   Fibromyalgia    GERD (gastroesophageal reflux disease)    Hemorrhoid    History of bladder infections    History of thyroid  cancer 2009  S/P TOTAL THYROIDECTOMY AND RADIATION   Hyperlipidemia    Hypertension CARDIOLOGIST- DR BLANCA- WILL REQUEST LATE NOTE   DENIES S & S   Hypothyroidism    Insomnia    Left shoulder pain    Macular degeneration    MRSA infection    nasal treated more than 15 years ago   Normal nuclear stress test 01/25/2009   OA (osteoarthritis) JOINTS   Osteopenia    Osteoporosis    Painful orthopaedic hardware    left foot   Pneumonia    PONV (postoperative nausea and vomiting) SEVERE   Rotator cuff disorder LEFT SHOULDER RTC IMPINGMENT   Sleep apnea    mouth guard, no cpap    Sleep apnea in adult 06/26/2017   Spondylolisthesis of lumbar region    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   Wears glasses    Past Surgical History:  Procedure Laterality Date   BACK SURGERY     Dr Mavis- spondylosis   BILATERAL CARPAL TUNNEL RELEASE  1994   BILATERAL ELBOW SURG.  1999   BILATERAL SALPINGOOPHECTOMY  1993   POST-OP URETER REPAIR 12 DAYS AFTER    CATARACT EXTRACTION, BILATERAL     COLONOSCOPY     EYE SURGERY     FOOT ARTHRODESIS Left  07/10/2016   Procedure: LEFT 2ND TARSAL METATARSAL ARTHRODESIS  GASTROC RECESSION LEFT [employer] MODIFIED MCBRIDE BUIONECTOMY;  Surgeon: Norleen Armor, MD;  Location: [facility];  Service: Orthopedics;  Laterality: Left;   GASTROC RECESSION EXTREMITY Left 07/10/2016   Procedure: GASTROC RECESSION EXTREMITY;  Surgeon: Norleen Armor, MD;  Location: [facility];  Service: Orthopedics;  Laterality: Left;   HARDWARE REMOVAL Left 05/20/2018   Procedure: Left foot removal of deep implants;  Surgeon: Armor Norleen, MD;  Location: [facility];  Service: Orthopedics;  Laterality: Left;    LEFT THUMB JOINT REPLACEMENT  2005   RIGHT DONE IN 2004   OOPHORECTOMY     POLYPECTOMY     RIGHT KNEE ARTHROSCOPY  X2  BEFORE 2011   RIGHT KNEE ARTHROSCOPY/ PARTIAL LATERAL MENISECTOMY/ TRICOMPARTMENT CHONDROPLASTY/ DECOMPRESSION CYST  10/26/2009   RIGHT KNEE CLOSED MANIPULATION  09/11/2010   RIGHT SHOULDER ARTHROSCOPY  2010  &  2004   TIBIA CYST REMOVED AND ORIF LEG FX  1958   TONSILLECTOMY  1968  TOTAL KNEE ARTHROPLASTY  07/16/2010   RIGHT   TOTAL KNEE ARTHROPLASTY Left 03/22/2024   Procedure: ARTHROPLASTY, KNEE, TOTAL;  Surgeon: Josefina Chew, MD;  Location: [facility];  Service: Orthopedics;  Laterality: Left;   TOTAL THYROIDECTOMY  2009   CANCER  (POST-OP BLEED)  AND RADIATION TX   trigger finger repair      07/2023   trigger fingers Right 2013   3rd and 4th fingers   UPPER GASTROINTESTINAL ENDOSCOPY     VAGINAL HYSTERECTOMY  1990   Patient Active Problem List   Diagnosis Date Noted   Primary hypertension 05/12/2024   Pain of right thumb 02/26/2024   Precordial pain 12/23/2023   Primary osteoarthritis of left knee with subchondral insufficiency fractures 09/23/2023   History of arthroplasty of right knee 07/03/2023   Primary osteoarthritis of right shoulder 07/03/2023   Coronary artery disease involving native coronary artery of native heart without angina  pectoris 05/25/2023   Coronary artery calcification 11/06/2022   Statin myopathy 11/06/2022   Frequent UTI 10/29/2022   Wart of hand 09/30/2022   Acute cystitis without hematuria 05/14/2021   Medial epicondylitis, right 03/25/2021   Trigger thumb, right thumb 11/19/2020   Right wrist pain 10/23/2020   Osteoporosis 04/05/2020   Trigger finger, left middle finger 07/27/2019   Chronic neck pain 11/10/2018   Orthopedic hardware present 02/17/2018   Controlled diabetes mellitus type 2 with complications (HCC) 12/15/2017   Intractable chronic migraine without aura and with status migrainosus 12/15/2017   Arthralgia 12/15/2017   Joint pain 12/15/2017   Migraine, transformed 12/15/2017   Chronic migraine without aura, with intractable migraine, so stated, with status migrainosus 12/15/2017   Pain of left shoulder joint on movement 10/19/2017   OSA (obstructive sleep apnea) 06/26/2017   Snoring 03/25/2016   Palpitations 03/25/2016   Headache 03/25/2016   Sensory disorder of trigeminal nerve 11/26/2015   Spondylolisthesis of lumbar region 08/09/2014   Lumbar adjacent segment disease with spondylolisthesis 08/09/2014   Back pain 02/17/2014   Macular degeneration 02/17/2014   Backache 02/17/2014   Other and unspecified hyperlipidemia 10/14/2013   Hyperlipidemia 10/14/2013   Onychomycosis 12/01/2012   Arthritis 04/12/2012   Hypothyroidism 04/12/2012    PCP: Watt, MD  REFERRING PROVIDER: Josefina, MD  REFERRING DIAG: s/p Left TKA  THERAPY DIAG:  Stiffness of left knee, not elsewhere classified  Acute pain of left knee  Difficulty in walking, not elsewhere classified  Rationale for Evaluation and Treatment: Rehabilitation  ONSET DATE: 03/22/24  SUBJECTIVE:   SUBJECTIVE STATEMENT:    Pain is exactly the same. Bought a bike for home and doing 6 min daily on lowest level. TENS not really helping. Think about pain mang MD  PAIN:  Are you having pain? Left knee  6-7/10  PRECAUTIONS: None  RED FLAGS: None   WEIGHT BEARING RESTRICTIONS: No  FALLS:  Has patient fallen in last 6 months? No  LIVING ENVIRONMENT: Lives with: lives with their family Lives in: House/apartment Stairs: Yes: Internal: 2 steps; can reach both Has following equipment at home: Single point cane, Walker - 2 wheeled, Crutches, and shower chair  OCCUPATION: retired  PLOF: Independent and shopping and housework  PATIENT GOALS: have normal walk without device, less pain  NEXT MD VISIT: 04/06/24  OBJECTIVE:  Note: Objective measures were completed at Evaluation unless otherwise noted.   COGNITION: Overall cognitive status: Within functional limits for tasks assessed     SENSATION: WFL  EDEMA:  Circumferential: mid patella 47 cm on  the left, 40 cm on the right  MUSCLE LENGTH: Tight calves and HS  POSTURE: rounded shoulders and forward head  PALPATION: Tender, ecchymosis left thigh and knee, inner and posterior  LOWER EXTREMITY ROM:  Active ROM AROM LEft eval PROM Left eval LLE 04/01/24 AROM  LLE 04/04/24 04/26/24 04/29/24 05/03/24 AROM/PROM 06/29/24 AROM 07/19/24 AROM  Hip flexion           Hip extension           Hip abduction           Hip adduction           Hip internal rotation           Hip external rotation           Knee flexion 65 70 82 85 112 110 112/117 114 115  Knee extension 15 9 5 5  0 0 4/0 0 0  Ankle dorsiflexion           Ankle plantarflexion           Ankle inversion           Ankle eversion            (Blank rows = not tested)  LOWER EXTREMITY MMT:  MMT Right eval Left eval Left  06/29/24  Hip flexion     Hip extension     Hip abduction     Hip adduction     Hip internal rotation     Hip external rotation     Knee flexion  2 4-  Knee extension  2 4-  Ankle dorsiflexion     Ankle plantarflexion     Ankle inversion     Ankle eversion      (Blank rows = not tested)  LOWER EXTREMITY SPECIAL TESTS:  Knee special  tests: quad lag, ballotable patella  FUNCTIONAL TESTS:  Timed up and go (TUG): 17 seconds  GAIT: Distance walked: 60 feet Assistive device utilized: Environmental Consultant - 2 wheeled Level of assistance: Modified independence Comments: mild antalgic gait                                                                                                                                TREATMENT DATE:   08/04/24 AROM 0-118 Passive end range stretching, pat mobs, STW Worked on gait working on decreased circumduction, toe off and knee flexion in swing phase TM 1 mph 4 min to reinforce Red tband HS curl 2 sets 10 LAQ 3 # 2 sets 10 HOLD 3 sec in TKE Leg Press 20# 2 sets 10 Calf stretches 10 sec 3 x Black bar heel raises 2 sets 10 Bike L 3    07/26/24 Bike level 3 x 6 minutes Feet on ball K2C, rotation, small bridge, isometric abs Calf stretches 15# HS curls Passive stretch STM to the left thigh, ITB, scar and medial knee  07/19/24 Bike level 4 x 6  minutes Calf stretch Leg press 20#, no weight for ROM 15# HS curls Passive stretch Flexion and extension STM to the scar, quad and ITB area and into the lateral calf  07/12/24 Passive ROM/AROM STM to the left ITB, medial knee and quads Bike L 4 6 min full rev Leg press 20# 2 sets 10. Calf raises 20# 8x then stopped d/t pain Seated hip flex 20 x alt 2 sets green tband Seated clams green tband 15 x 2 sets #3 LAQ 2 sets 10 3# standing HS curl and marchin 10 x each  07/07/24 Passive ROM/AROM STM to the left ITB, medial knee and quads AROM 0-115 Seated hip flex 20 x alt 2 sets green tband Seated clams green tband 15 x 2 sets Bike L 3 full rev SAQ 2 sets 10 LAQ 3 # 10x Red tband HS curl 10 x with some pain Leg press 20# 2 sets 10  06/29/24 Passive ROM/AROM STM to the left ITB, medial knee and quads Some scar and patellar mobs TKE in standing  06/21/24 Nustep L 5 Feet on ball K2C, rotation, small bridge, isometric  abs Ball b/n knees squeeze SAQ Passive HS and ITB stretches STM to the ITB, the scar and the left glutes AROM 117 Green tband HS curl 2 sets 10 LAQ 2# 2 sets 10 Green tband TKE 2 sets 10   06/16/24 Feet on ball K2C, rotation, small bridge, isometric abs Ball b/n knees squeeze SAQ Passive HS and ITB stretches STM to the ITB, the scar and the left glutes Nustep level 5 LE's x 5 mintues  06/09/24 TUG 9s  NuStep L5x22mins  LAQ 3# x10, x6 HS curls green 2x10 STW to quad and ITB, followed by passive flexion and extension Fitter pushes 1 blue band x10, 2 blue bands x10 STS 2x10 Calf stretch 20s x2 ROM flexion at end of session 119d  06/03/24 AROM SOC 110 LAQ 3 # 2 sets 10 Standing HS curl 3# 2 sets 10 Standing 3# SL hip flex,ext and abd 10 x  Nustep L 5 PROM flex and ext STW to left quad and ITB Resisted gait 20# 4 ways 5 x each AROM end of session  117  05/31/24 Belt mobs to left knee to increase capsular stretch Belt mobs for flexion PROM flex and ext STW to left quad and ITB ITB stretch in SL - limited tolerance lying on RT hip Nustep L 4 6 min Left knee MMT 4+ Left Knee AROM sitting 2-117 Green tband HS curl 2 sets 10 3# LAQ 2 sets 10 6 inch step up fwd and laterally 10 x each Calf stretch Resisted gait laterally 20 # Worked on gait to increase stride , knee flexion in swing phase and decrease circumduction that will increase ITB pull  05/27/24 Bike partial revs x5 mins Calf stretch Resisted gait 20# 4 way x4 Step ups 6 STS 2x10 Bridges 2x5 Sidelying hip abduction 2x5 ROM- 117d   PATIENT EDUCATION:  Education details: POC/HEP Person educated: Patient Education method: Programmer, Multimedia, Facilities Manager, and Handouts Education comprehension: verbalized understanding  HOME EXERCISE PROGRAM: Access Code: [email or web address] URL: [email or web address] Date: 04/11/2024 Prepared by: Almetta Fam  Exercises - Standing Knee Flexion Stretch on Step  -  3- x daily - 7 x weekly - 1 sets - 5-10 reps - 5-10 hold - Heel Raise on Step  - 3 x daily - 7 x weekly - 1 sets - 10 reps - Standing Gastroc Stretch on Step  -  3 x daily - 7 x weekly - 1 sets - 10 reps - Forward Step Touch  - 3 x daily - 7 x weekly - 1 sets - 10 reps - Seated Knee Flexion Slide  - 3 x daily - 7 x weekly - 1 sets - 10 reps - Sit to Stand  - 3 x daily - 7 x weekly - 1 sets - 10 reps  ASSESSMENT:  CLINICAL IMPRESSION:  As stated in multi prior notes- Patient with good ROM, good gait, still slow with some limp on the left.  Pain is the biggest issue. She feels PT is helpful and feels better after. Worked on gait to decrease compensation as I feel this may be contributing to muscle soreness.Renewal done for addition month at 1 x a week. Very slow progress being made towards goals mostly limited by pain.   OBJECTIVE IMPAIRMENTS: Abnormal gait, cardiopulmonary status limiting activity, decreased activity tolerance, decreased balance, decreased endurance, decreased mobility, difficulty walking, decreased ROM, decreased strength, increased edema, increased muscle spasms, impaired flexibility, impaired vision/preception, improper body mechanics, postural dysfunction, and pain.   REHAB POTENTIAL: Good  CLINICAL DECISION MAKING: Evolving/moderate complexity  EVALUATION COMPLEXITY: Moderate   GOALS: Goals reviewed with patient? Yes  SHORT TERM GOALS: Target date: 04/20/24 Independent with initial HEP Baseline: Goal status: 04/01/24 MET  LONG TERM GOALS: Target date: 06/29/24  Independent with advanced HEP Baseline:  Goal status: progressing12/3/25  2.  Independent with RICE Baseline:  Goal status: MET 04/14/24  3.  Decrease pain 50% for better function and quality of life Baseline:  Goal status: progressing 04/14/24, not 50% yet progressing 04/29/24 05/06/24 progressing  05/13/24 40-50% PROGRESSING 05/31/24 progressing   6/10 pain, ITB made it worse 06/16/24 and  07/07/24,  07/19/24, 07/26/24 08/04/24 pain is still an issue  4.  Decrease TUG to 12 seconds without device Baseline:  Goal status: 04/22/24 MET, 11 seconds without device 05/03/24, 9s MET 06/09/24  5.  Increase AROM of the left knee to 4-116 degrees flexion Baseline:  Goal status: 04/14/24 2-100 progressing  and 04/22/24, 0-110d 04/29/24 progressing 05/13/24 MET  6.  Go up and down steps step over step Baseline:  Goal status: 04/14/24 progressing, progressing 04/29/24 and 05/06/24  05/31/24 reverted back since ITB flare up, still has difficulty 06/16/24 and 06/21/24, 06/29/24, 07/19/24, 07/26/24 Slow progression and still issues 08/04/24   PLAN:  PT FREQUENCY: 2x/week  PT DURATION: 12 weeks  PLANNED INTERVENTIONS: 97164- PT Re-evaluation, 97110-Therapeutic exercises, 97530- Therapeutic activity, 97112- Neuromuscular re-education, 97535- Self Care, 02859- Manual therapy, [phone]- Gait training, [phone]- Electrical stimulation (unattended), 97016- Vasopneumatic device, Patient/Family education, Balance training, Stair training, Taping, Joint mobilization, and Cryotherapy  PLAN FOR NEXT SESSION: renewal done for 1 x a week for 1 additional month Winferd Wease,ANGIE, PTA 08/04/2024, 1:59 PM  "

## 2024-08-04 NOTE — Telephone Encounter (Signed)
 Calling to get dx code added to the patient chart. Stated they will fax over paperwork. Please advise

## 2024-08-05 ENCOUNTER — Other Ambulatory Visit: Payer: Self-pay | Admitting: Family Medicine

## 2024-08-05 NOTE — Telephone Encounter (Signed)
 I wont be in the office for a week as I will be in the hospital. If it can wait, I will address week after next.  Thanks MJP

## 2024-08-07 ENCOUNTER — Other Ambulatory Visit: Payer: Self-pay | Admitting: Cardiology

## 2024-08-08 ENCOUNTER — Other Ambulatory Visit: Payer: Self-pay | Admitting: Family Medicine

## 2024-08-08 NOTE — Addendum Note (Signed)
 Addended by: WATT RAISIN C on: 08/08/2024 12:38 PM   Modules accepted: Orders

## 2024-08-08 NOTE — Telephone Encounter (Signed)
 Requesting: Ambien  5MG  Contract: N/A UDS: 06/15/2024 Last Visit: 06/29/2024 Next Visit: N/A Last Refill: 05/18/2024  Please Advise

## 2024-08-11 ENCOUNTER — Ambulatory Visit: Payer: Medicare (Managed Care) | Admitting: Physical Therapy

## 2024-08-11 DIAGNOSIS — M25662 Stiffness of left knee, not elsewhere classified: Secondary | ICD-10-CM | POA: Diagnosis not present

## 2024-08-11 DIAGNOSIS — M25562 Pain in left knee: Secondary | ICD-10-CM

## 2024-08-11 DIAGNOSIS — R262 Difficulty in walking, not elsewhere classified: Secondary | ICD-10-CM

## 2024-08-11 NOTE — Therapy (Signed)
 " OUTPATIENT PHYSICAL THERAPY LOWER EXTREMITY       Patient Name: KAMYA WATLING MRN: [id number] DOB:24-Apr-1951, 74 y.o., female Today's Date: 08/11/2024  END OF SESSION:  PT End of Session - 08/11/24 1441     Visit Number 29    Date for Recertification  09/02/24    Authorization Type [employer] medicare    PT Start Time 1445    PT Stop Time 1530    PT Time Calculation (min) 45 min               Past Medical History:  Diagnosis Date   Allergy    Asthma    Cancer (HCC)    thyroid  cancer   Cataract    Chronic migraine BOTOX INJECTION EVERY 3 MONTHS   Chronic pain in right shoulder    Constipation    Coronary artery disease    Diabetes mellitus    Disorder of inner ear CAUSES VERTIGO OCCASIONALLY   Fibromyalgia    GERD (gastroesophageal reflux disease)    Hemorrhoid    History of bladder infections    History of thyroid  cancer 2009  S/P TOTAL THYROIDECTOMY AND RADIATION   Hyperlipidemia    Hypertension CARDIOLOGIST- DR BLANCA- WILL REQUEST LATE NOTE   DENIES S & S   Hypothyroidism    Insomnia    Left shoulder pain    Macular degeneration    MRSA infection    nasal treated more than 15 years ago   Normal nuclear stress test 01/25/2009   OA (osteoarthritis) JOINTS   Osteopenia    Osteoporosis    Painful orthopaedic hardware    left foot   Pneumonia    PONV (postoperative nausea and vomiting) SEVERE   Rotator cuff disorder LEFT SHOULDER RTC IMPINGMENT   Sleep apnea    mouth guard, no cpap    Sleep apnea in adult 06/26/2017   Spondylolisthesis of lumbar region    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   Wears glasses    Past Surgical History:  Procedure Laterality Date   BACK SURGERY     Dr Mavis- spondylosis   BILATERAL CARPAL TUNNEL RELEASE  1994   BILATERAL ELBOW SURG.  1999   BILATERAL SALPINGOOPHECTOMY  1993   POST-OP URETER REPAIR 12 DAYS AFTER    CATARACT EXTRACTION, BILATERAL     COLONOSCOPY     EYE SURGERY     FOOT ARTHRODESIS Left  07/10/2016   Procedure: LEFT 2ND TARSAL METATARSAL ARTHRODESIS  GASTROC RECESSION LEFT [employer] MODIFIED MCBRIDE BUIONECTOMY;  Surgeon: Norleen Armor, MD;  Location: [facility];  Service: Orthopedics;  Laterality: Left;   GASTROC RECESSION EXTREMITY Left 07/10/2016   Procedure: GASTROC RECESSION EXTREMITY;  Surgeon: Norleen Armor, MD;  Location: [facility];  Service: Orthopedics;  Laterality: Left;   HARDWARE REMOVAL Left 05/20/2018   Procedure: Left foot removal of deep implants;  Surgeon: Armor Norleen, MD;  Location:  SURGERY CENTER;  Service: Orthopedics;  Laterality: Left;    LEFT THUMB JOINT REPLACEMENT  2005   RIGHT DONE IN 2004   OOPHORECTOMY     POLYPECTOMY     RIGHT KNEE ARTHROSCOPY  X2  BEFORE 2011   RIGHT KNEE ARTHROSCOPY/ PARTIAL LATERAL MENISECTOMY/ TRICOMPARTMENT CHONDROPLASTY/ DECOMPRESSION CYST  10/26/2009   RIGHT KNEE CLOSED MANIPULATION  09/11/2010   RIGHT SHOULDER ARTHROSCOPY  2010  &  2004   TIBIA CYST REMOVED AND ORIF LEG FX  1958   TONSILLECTOMY  1968  TOTAL KNEE ARTHROPLASTY  07/16/2010   RIGHT   TOTAL KNEE ARTHROPLASTY Left 03/22/2024   Procedure: ARTHROPLASTY, KNEE, TOTAL;  Surgeon: Josefina Chew, MD;  Location: [facility];  Service: Orthopedics;  Laterality: Left;   TOTAL THYROIDECTOMY  2009   CANCER  (POST-OP BLEED)  AND RADIATION TX   trigger finger repair      07/2023   trigger fingers Right 2013   3rd and 4th fingers   UPPER GASTROINTESTINAL ENDOSCOPY     VAGINAL HYSTERECTOMY  1990   Patient Active Problem List   Diagnosis Date Noted   Primary hypertension 05/12/2024   Pain of right thumb 02/26/2024   Precordial pain 12/23/2023   Primary osteoarthritis of left knee with subchondral insufficiency fractures 09/23/2023   History of arthroplasty of right knee 07/03/2023   Primary osteoarthritis of right shoulder 07/03/2023   Coronary artery disease involving native coronary artery of native heart without angina  pectoris 05/25/2023   Coronary artery calcification 11/06/2022   Statin myopathy 11/06/2022   Frequent UTI 10/29/2022   Wart of hand 09/30/2022   Acute cystitis without hematuria 05/14/2021   Medial epicondylitis, right 03/25/2021   Trigger thumb, right thumb 11/19/2020   Right wrist pain 10/23/2020   Osteoporosis 04/05/2020   Trigger finger, left middle finger 07/27/2019   Chronic neck pain 11/10/2018   Orthopedic hardware present 02/17/2018   Controlled diabetes mellitus type 2 with complications (HCC) 12/15/2017   Intractable chronic migraine without aura and with status migrainosus 12/15/2017   Arthralgia 12/15/2017   Joint pain 12/15/2017   Migraine, transformed 12/15/2017   Chronic migraine without aura, with intractable migraine, so stated, with status migrainosus 12/15/2017   Pain of left shoulder joint on movement 10/19/2017   OSA (obstructive sleep apnea) 06/26/2017   Snoring 03/25/2016   Palpitations 03/25/2016   Headache 03/25/2016   Sensory disorder of trigeminal nerve 11/26/2015   Spondylolisthesis of lumbar region 08/09/2014   Lumbar adjacent segment disease with spondylolisthesis 08/09/2014   Back pain 02/17/2014   Macular degeneration 02/17/2014   Backache 02/17/2014   Other and unspecified hyperlipidemia 10/14/2013   Hyperlipidemia 10/14/2013   Onychomycosis 12/01/2012   Arthritis 04/12/2012   Hypothyroidism 04/12/2012    PCP: Watt, MD  REFERRING PROVIDER: Josefina, MD  REFERRING DIAG: s/p Left TKA  THERAPY DIAG:  Stiffness of left knee, not elsewhere classified  Acute pain of left knee  Difficulty in walking, not elsewhere classified  Rationale for Evaluation and Treatment: Rehabilitation  ONSET DATE: 03/22/24  SUBJECTIVE:   SUBJECTIVE STATEMENT:   global lift is better. Pain is terrible.still cannot tolerate leg down > 10 min. Amb in with better gait pattern than last week. Using TENS a lot and some relief. Still very limited to tolerance to  pressure from pants on leg.    PAIN:  Are you having pain? Left knee 5/10  PRECAUTIONS: None  RED FLAGS: None   WEIGHT BEARING RESTRICTIONS: No  FALLS:  Has patient fallen in last 6 months? No  LIVING ENVIRONMENT: Lives with: lives with their family Lives in: House/apartment Stairs: Yes: Internal: 2 steps; can reach both Has following equipment at home: Single point cane, Walker - 2 wheeled, Crutches, and shower chair  OCCUPATION: retired  PLOF: Independent and shopping and housework  PATIENT GOALS: have normal walk without device, less pain  NEXT MD VISIT: 04/06/24  OBJECTIVE:  Note: Objective measures were completed at Evaluation unless otherwise noted.   COGNITION: Overall cognitive status: Within functional limits for tasks assessed  SENSATION: WFL  EDEMA:  Circumferential: mid patella 47 cm on the left, 40 cm on the right  MUSCLE LENGTH: Tight calves and HS  POSTURE: rounded shoulders and forward head  PALPATION: Tender, ecchymosis left thigh and knee, inner and posterior  LOWER EXTREMITY ROM:  Active ROM AROM LEft eval PROM Left eval LLE 04/01/24 AROM  LLE 04/04/24 04/26/24 04/29/24 05/03/24 AROM/PROM 06/29/24 AROM 07/19/24 AROM  Hip flexion           Hip extension           Hip abduction           Hip adduction           Hip internal rotation           Hip external rotation           Knee flexion 65 70 82 85 112 110 112/117 114 115  Knee extension 15 9 5 5  0 0 4/0 0 0  Ankle dorsiflexion           Ankle plantarflexion           Ankle inversion           Ankle eversion            (Blank rows = not tested)  LOWER EXTREMITY MMT:  MMT Right eval Left eval Left  06/29/24  Hip flexion     Hip extension     Hip abduction     Hip adduction     Hip internal rotation     Hip external rotation     Knee flexion  2 4-  Knee extension  2 4-  Ankle dorsiflexion     Ankle plantarflexion     Ankle inversion     Ankle eversion      (Blank  rows = not tested)  LOWER EXTREMITY SPECIAL TESTS:  Knee special tests: quad lag, ballotable patella  FUNCTIONAL TESTS:  Timed up and go (TUG): 17 seconds  GAIT: Distance walked: 60 feet Assistive device utilized: Environmental Consultant - 2 wheeled Level of assistance: Modified independence Comments: mild antalgic gait                                                                                                                                TREATMENT DATE:   08/11/24 Nustep L 5 Leg Press 20# 2 sets 10 Black bar heel raise and toe raise 20 x HS curl 10# 2 sets 10 Knee ext 5# 2 sets 10 STW left thigh and knee PROM flex and ext with end range hold Pat mobs AROm 0-118 MMT 5/5    08/04/24 AROM 0-118 Passive end range stretching, pat mobs, STW Worked on gait working on decreased circumduction, toe off and knee flexion in swing phase TM 1 mph 4 min to reinforce Red tband HS curl 2 sets 10 LAQ 3 # 2 sets 10 HOLD 3 sec in TKE Leg  Press 20# 2 sets 10 Calf stretches 10 sec 3 x Black bar heel raises 2 sets 10 Bike L 3    07/26/24 Bike level 3 x 6 minutes Feet on ball K2C, rotation, small bridge, isometric abs Calf stretches 15# HS curls Passive stretch STM to the left thigh, ITB, scar and medial knee  07/19/24 Bike level 4 x 6 minutes Calf stretch Leg press 20#, no weight for ROM 15# HS curls Passive stretch Flexion and extension STM to the scar, quad and ITB area and into the lateral calf  07/12/24 Passive ROM/AROM STM to the left ITB, medial knee and quads Bike L 4 6 min full rev Leg press 20# 2 sets 10. Calf raises 20# 8x then stopped d/t pain Seated hip flex 20 x alt 2 sets green tband Seated clams green tband 15 x 2 sets #3 LAQ 2 sets 10 3# standing HS curl and marchin 10 x each  07/07/24 Passive ROM/AROM STM to the left ITB, medial knee and quads AROM 0-115 Seated hip flex 20 x alt 2 sets green tband Seated clams green tband 15 x 2 sets Bike L 3  full rev SAQ 2 sets 10 LAQ 3 # 10x Red tband HS curl 10 x with some pain Leg press 20# 2 sets 10  06/29/24 Passive ROM/AROM STM to the left ITB, medial knee and quads Some scar and patellar mobs TKE in standing  06/21/24 Nustep L 5 Feet on ball K2C, rotation, small bridge, isometric abs Ball b/n knees squeeze SAQ Passive HS and ITB stretches STM to the ITB, the scar and the left glutes AROM 117 Green tband HS curl 2 sets 10 LAQ 2# 2 sets 10 Green tband TKE 2 sets 10   06/16/24 Feet on ball K2C, rotation, small bridge, isometric abs Ball b/n knees squeeze SAQ Passive HS and ITB stretches STM to the ITB, the scar and the left glutes Nustep level 5 LE's x 5 mintues  06/09/24 TUG 9s  NuStep L5x31mins  LAQ 3# x10, x6 HS curls green 2x10 STW to quad and ITB, followed by passive flexion and extension Fitter pushes 1 blue band x10, 2 blue bands x10 STS 2x10 Calf stretch 20s x2 ROM flexion at end of session 119d  06/03/24 AROM SOC 110 LAQ 3 # 2 sets 10 Standing HS curl 3# 2 sets 10 Standing 3# SL hip flex,ext and abd 10 x  Nustep L 5 PROM flex and ext STW to left quad and ITB Resisted gait 20# 4 ways 5 x each AROM end of session  117  05/31/24 Belt mobs to left knee to increase capsular stretch Belt mobs for flexion PROM flex and ext STW to left quad and ITB ITB stretch in SL - limited tolerance lying on RT hip Nustep L 4 6 min Left knee MMT 4+ Left Knee AROM sitting 2-117 Green tband HS curl 2 sets 10 3# LAQ 2 sets 10 6 inch step up fwd and laterally 10 x each Calf stretch Resisted gait laterally 20 # Worked on gait to increase stride , knee flexion in swing phase and decrease circumduction that will increase ITB pull  05/27/24 Bike partial revs x5 mins Calf stretch Resisted gait 20# 4 way x4 Step ups 6 STS 2x10 Bridges 2x5 Sidelying hip abduction 2x5 ROM- 117d   PATIENT EDUCATION:  Education details: POC/HEP Person educated:  Patient Education method: Explanation, Facilities Manager, and Handouts Education comprehension: verbalized understanding  HOME EXERCISE PROGRAM: Access  Code: [email or web address] URL: [email or web address] Date: 04/11/2024 Prepared by: Almetta Fam  Exercises - Standing Knee Flexion Stretch on Step  - 3- x daily - 7 x weekly - 1 sets - 5-10 reps - 5-10 hold - Heel Raise on Step  - 3 x daily - 7 x weekly - 1 sets - 10 reps - Standing Gastroc Stretch on Step  - 3 x daily - 7 x weekly - 1 sets - 10 reps - Forward Step Touch  - 3 x daily - 7 x weekly - 1 sets - 10 reps - Seated Knee Flexion Slide  - 3 x daily - 7 x weekly - 1 sets - 10 reps - Sit to Stand  - 3 x daily - 7 x weekly - 1 sets - 10 reps  ASSESSMENT:  CLINICAL IMPRESSION:  global life is better. Pain is terrible.still cannot tolerate leg down > 10 min. Amb in with better gait pattern than last week- longer stride and step thru with less circumduction. Using TENS a lot and some relief. Still very limited to tolerance to pressure from pants on leg.No changes in goals. Progressed strength as tolerated  sh edid tolerate more today and seemed less sensitive with STW    OBJECTIVE IMPAIRMENTS: Abnormal gait, cardiopulmonary status limiting activity, decreased activity tolerance, decreased balance, decreased endurance, decreased mobility, difficulty walking, decreased ROM, decreased strength, increased edema, increased muscle spasms, impaired flexibility, impaired vision/preception, improper body mechanics, postural dysfunction, and pain.   REHAB POTENTIAL: Good  CLINICAL DECISION MAKING: Evolving/moderate complexity  EVALUATION COMPLEXITY: Moderate   GOALS: Goals reviewed with patient? Yes  SHORT TERM GOALS: Target date: 04/20/24 Independent with initial HEP Baseline: Goal status: 04/01/24 MET  LONG TERM GOALS: Target date: 06/29/24  Independent with advanced HEP Baseline:  Goal status: progressing12/3/25  2.  Independent  with RICE Baseline:  Goal status: MET 04/14/24  3.  Decrease pain 50% for better function and quality of life Baseline:  Goal status: progressing 04/14/24, not 50% yet progressing 04/29/24 05/06/24 progressing  05/13/24 40-50% PROGRESSING 05/31/24 progressing   6/10 pain, ITB made it worse 06/16/24 and  07/07/24, 07/19/24, 07/26/24 08/04/24 pain is still an issue  4.  Decrease TUG to 12 seconds without device Baseline:  Goal status: 04/22/24 MET, 11 seconds without device 05/03/24, 9s MET 06/09/24  5.  Increase AROM of the left knee to 4-116 degrees flexion Baseline:  Goal status: 04/14/24 2-100 progressing  and 04/22/24, 0-110d 04/29/24 progressing 05/13/24 MET  6.  Go up and down steps step over step Baseline:  Goal status: 04/14/24 progressing, progressing 04/29/24 and 05/06/24  05/31/24 reverted back since ITB flare up, still has difficulty 06/16/24 and 06/21/24, 06/29/24, 07/19/24, 07/26/24 Slow progression and still issues 08/04/24   PLAN:  PT FREQUENCY: 2x/week  PT DURATION: 12 weeks  PLANNED INTERVENTIONS: 97164- PT Re-evaluation, 97110-Therapeutic exercises, 97530- Therapeutic activity, 97112- Neuromuscular re-education, 97535- Self Care, [id number]- Manual therapy, [phone]- Gait training, [phone]- Electrical stimulation (unattended), 97016- Vasopneumatic device, Patient/Family education, Balance training, Stair training, Taping, Joint mobilization, and Cryotherapy  PLAN FOR NEXT SESSION: 2 additional visits remain and then possible HOLD until MD early MArch Valton Schwartz,ANGIE, PTA 08/11/2024, 2:41 PM  "

## 2024-08-13 ENCOUNTER — Encounter: Payer: Self-pay | Admitting: Family Medicine

## 2024-08-13 DIAGNOSIS — G43711 Chronic migraine without aura, intractable, with status migrainosus: Secondary | ICD-10-CM

## 2024-08-13 DIAGNOSIS — G43709 Chronic migraine without aura, not intractable, without status migrainosus: Secondary | ICD-10-CM

## 2024-08-15 ENCOUNTER — Other Ambulatory Visit: Payer: Self-pay | Admitting: Family Medicine

## 2024-08-15 MED ORDER — UBRELVY 100 MG PO TABS: ORAL_TABLET | ORAL | 2 refills | Status: DC

## 2024-08-16 NOTE — Telephone Encounter (Signed)
 I have not

## 2024-08-16 NOTE — Telephone Encounter (Signed)
 Spoke with patient and she reports that they do not have blue cross blue shield anymore.

## 2024-08-16 NOTE — Telephone Encounter (Signed)
 I have not received paperwork, have you?

## 2024-08-18 ENCOUNTER — Ambulatory Visit: Payer: Medicare (Managed Care) | Admitting: Physical Therapy

## 2024-08-18 ENCOUNTER — Encounter: Payer: Self-pay | Admitting: Physical Therapy

## 2024-08-18 DIAGNOSIS — M25562 Pain in left knee: Secondary | ICD-10-CM

## 2024-08-18 DIAGNOSIS — M25662 Stiffness of left knee, not elsewhere classified: Secondary | ICD-10-CM

## 2024-08-18 DIAGNOSIS — R600 Localized edema: Secondary | ICD-10-CM

## 2024-08-18 DIAGNOSIS — R262 Difficulty in walking, not elsewhere classified: Secondary | ICD-10-CM

## 2024-08-18 NOTE — Therapy (Signed)
 " OUTPATIENT PHYSICAL THERAPY LOWER EXTREMITY  Progress Note Reporting Period 06/16/24 to 08/18/24 for visits 21-30  See note below for Objective Data and Assessment of Progress/Goals.         Patient Name: Debbie Hayes MRN: [id number] DOB:[birth year], 74 y.o., female Today's Date: 08/18/2024  END OF SESSION:  PT End of Session - 08/18/24 1450     Visit Number 30    Date for Recertification  09/02/24    Authorization Type [employer] medicare    PT Start Time 1445    PT Stop Time 1530    PT Time Calculation (min) 45 min    Activity Tolerance Patient tolerated treatment well    Behavior During Therapy WFL for tasks assessed/performed               Past Medical History:  Diagnosis Date   Allergy    Asthma    Cancer (HCC)    thyroid  cancer   Cataract    Chronic migraine BOTOX INJECTION EVERY 3 MONTHS   Chronic pain in right shoulder    Constipation    Coronary artery disease    Diabetes mellitus    Disorder of inner ear CAUSES VERTIGO OCCASIONALLY   Fibromyalgia    GERD (gastroesophageal reflux disease)    Hemorrhoid    History of bladder infections    History of thyroid  cancer 2009  S/P TOTAL THYROIDECTOMY AND RADIATION   Hyperlipidemia    Hypertension CARDIOLOGIST- DR BLANCA- WILL REQUEST LATE NOTE   DENIES S & S   Hypothyroidism    Insomnia    Left shoulder pain    Macular degeneration    MRSA infection    nasal treated more than 15 years ago   Normal nuclear stress test 01/25/2009   OA (osteoarthritis) JOINTS   Osteopenia    Osteoporosis    Painful orthopaedic hardware    left foot   Pneumonia    PONV (postoperative nausea and vomiting) SEVERE   Rotator cuff disorder LEFT SHOULDER RTC IMPINGMENT   Sleep apnea    mouth guard, no cpap    Sleep apnea in adult 06/26/2017   Spondylolisthesis of lumbar region    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   Wears glasses    Past Surgical History:  Procedure Laterality Date   BACK SURGERY     Dr  Mavis- spondylosis   BILATERAL CARPAL TUNNEL RELEASE  1994   BILATERAL ELBOW SURG.  1999   BILATERAL SALPINGOOPHECTOMY  1993   POST-OP URETER REPAIR 12 DAYS AFTER    CATARACT EXTRACTION, BILATERAL     COLONOSCOPY     EYE SURGERY     FOOT ARTHRODESIS Left 07/10/2016   Procedure: LEFT 2ND TARSAL METATARSAL ARTHRODESIS  GASTROC RECESSION LEFT [employer] MODIFIED MCBRIDE BUIONECTOMY;  Surgeon: Norleen Armor, MD;  Location: [facility];  Service: Orthopedics;  Laterality: Left;   GASTROC RECESSION EXTREMITY Left 07/10/2016   Procedure: GASTROC RECESSION EXTREMITY;  Surgeon: Norleen Armor, MD;  Location: [facility];  Service: Orthopedics;  Laterality: Left;   HARDWARE REMOVAL Left 05/20/2018   Procedure: Left foot removal of deep implants;  Surgeon: Armor Norleen, MD;  Location: [facility];  Service: Orthopedics;  Laterality: Left;    LEFT THUMB JOINT REPLACEMENT  2005   RIGHT DONE IN 2004   OOPHORECTOMY     POLYPECTOMY     RIGHT KNEE ARTHROSCOPY  X2  BEFORE 2011   RIGHT KNEE ARTHROSCOPY/ PARTIAL LATERAL MENISECTOMY/  TRICOMPARTMENT CHONDROPLASTY/ DECOMPRESSION CYST  10/26/2009   RIGHT KNEE CLOSED MANIPULATION  09/11/2010   RIGHT SHOULDER ARTHROSCOPY  2010  &  2004   TIBIA CYST REMOVED AND ORIF LEG FX  1958   TONSILLECTOMY  1968   TOTAL KNEE ARTHROPLASTY  07/16/2010   RIGHT   TOTAL KNEE ARTHROPLASTY Left 03/22/2024   Procedure: ARTHROPLASTY, KNEE, TOTAL;  Surgeon: Josefina Chew, MD;  Location: [facility];  Service: Orthopedics;  Laterality: Left;   TOTAL THYROIDECTOMY  2009   CANCER  (POST-OP BLEED)  AND RADIATION TX   trigger finger repair      07/2023   trigger fingers Right 2013   3rd and 4th fingers   UPPER GASTROINTESTINAL ENDOSCOPY     VAGINAL HYSTERECTOMY  1990   Patient Active Problem List   Diagnosis Date Noted   Primary hypertension 05/12/2024   Pain of right thumb 02/26/2024   Precordial pain 12/23/2023   Primary osteoarthritis of  left knee with subchondral insufficiency fractures 09/23/2023   History of arthroplasty of right knee 07/03/2023   Primary osteoarthritis of right shoulder 07/03/2023   Coronary artery disease involving native coronary artery of native heart without angina pectoris 05/25/2023   Coronary artery calcification 11/06/2022   Statin myopathy 11/06/2022   Frequent UTI 10/29/2022   Wart of hand 09/30/2022   Acute cystitis without hematuria 05/14/2021   Medial epicondylitis, right 03/25/2021   Trigger thumb, right thumb 11/19/2020   Right wrist pain 10/23/2020   Osteoporosis 04/05/2020   Trigger finger, left middle finger 07/27/2019   Chronic neck pain 11/10/2018   Orthopedic hardware present 02/17/2018   Controlled diabetes mellitus type 2 with complications (HCC) 12/15/2017   Intractable chronic migraine without aura and with status migrainosus 12/15/2017   Arthralgia 12/15/2017   Joint pain 12/15/2017   Migraine, transformed 12/15/2017   Chronic migraine without aura, with intractable migraine, so stated, with status migrainosus 12/15/2017   Pain of left shoulder joint on movement 10/19/2017   OSA (obstructive sleep apnea) 06/26/2017   Snoring 03/25/2016   Palpitations 03/25/2016   Headache 03/25/2016   Sensory disorder of trigeminal nerve 11/26/2015   Spondylolisthesis of lumbar region 08/09/2014   Lumbar adjacent segment disease with spondylolisthesis 08/09/2014   Back pain 02/17/2014   Macular degeneration 02/17/2014   Backache 02/17/2014   Other and unspecified hyperlipidemia 10/14/2013   Hyperlipidemia 10/14/2013   Onychomycosis 12/01/2012   Arthritis 04/12/2012   Hypothyroidism 04/12/2012    PCP: Copland, MD  REFERRING PROVIDER: Josefina, MD  REFERRING DIAG: s/p Left TKA  THERAPY DIAG:  Stiffness of left knee, not elsewhere classified  Acute pain of left knee  Difficulty in walking, not elsewhere classified  Localized edema  Rationale for Evaluation and Treatment:  Rehabilitation  ONSET DATE: 03/22/24  SUBJECTIVE:   SUBJECTIVE STATEMENT:  Pain is terrible.still cannot tolerate leg down > 10 min. Amb in with better gait pattern than last week. Using TENS a lot and some relief. Still very limited to tolerance to pressure from pants on leg.I have to prop the leg up all the time, anytime it hangs down really hurts    PAIN:  Are you having pain? Left knee 5/10  PRECAUTIONS: None  RED FLAGS: None   WEIGHT BEARING RESTRICTIONS: No  FALLS:  Has patient fallen in last 6 months? No  LIVING ENVIRONMENT: Lives with: lives with their family Lives in: House/apartment Stairs: Yes: Internal: 2 steps; can reach both Has following equipment at home: Single point cane, Walker -  2 wheeled, Crutches, and shower chair  OCCUPATION: retired  PLOF: Independent and shopping and housework  PATIENT GOALS: have normal walk without device, less pain  NEXT MD VISIT: 04/06/24  OBJECTIVE:  Note: Objective measures were completed at Evaluation unless otherwise noted.   COGNITION: Overall cognitive status: Within functional limits for tasks assessed     SENSATION: WFL  EDEMA:  Circumferential: mid patella 47 cm on the left, 40 cm on the right  MUSCLE LENGTH: Tight calves and HS  POSTURE: rounded shoulders and forward head  PALPATION: Tender, ecchymosis left thigh and knee, inner and posterior  LOWER EXTREMITY ROM:  Active ROM AROM LEft eval PROM Left eval LLE 04/01/24 AROM  LLE 04/04/24 04/26/24 04/29/24 05/03/24 AROM/PROM 06/29/24 AROM 07/19/24 AROM  Hip flexion           Hip extension           Hip abduction           Hip adduction           Hip internal rotation           Hip external rotation           Knee flexion 65 70 82 85 112 110 112/117 114 115  Knee extension 15 9 5 5  0 0 4/0 0 0  Ankle dorsiflexion           Ankle plantarflexion           Ankle inversion           Ankle eversion            (Blank rows = not tested)  LOWER  EXTREMITY MMT:  MMT Right eval Left eval Left  06/29/24  Hip flexion     Hip extension     Hip abduction     Hip adduction     Hip internal rotation     Hip external rotation     Knee flexion  2 4-  Knee extension  2 4-  Ankle dorsiflexion     Ankle plantarflexion     Ankle inversion     Ankle eversion      (Blank rows = not tested)  LOWER EXTREMITY SPECIAL TESTS:  Knee special tests: quad lag, ballotable patella  FUNCTIONAL TESTS:  Timed up and go (TUG): 17 seconds  GAIT: Distance walked: 60 feet Assistive device utilized: Environmental Consultant - 2 wheeled Level of assistance: Modified independence Comments: mild antalgic gait                                                                                                                                TREATMENT DATE:  08/18/24 Bike level 3.5 x 6 minutes HS curls 10# 2x10 LEg ext 5# 2x10 LEg press 20# Feet on ball K2C, rotation, small bridge, isometric abs STM to the left medial knee and the ITB area AROM 0-120 degrees flexion  08/11/24 Nustep L 5  Leg Press 20# 2 sets 10 Black bar heel raise and toe raise 20 x HS curl 10# 2 sets 10 Knee ext 5# 2 sets 10 STW left thigh and knee PROM flex and ext with end range hold Pat mobs AROm 0-118 MMT 5/5    08/04/24 AROM 0-118 Passive end range stretching, pat mobs, STW Worked on gait working on decreased circumduction, toe off and knee flexion in swing phase TM 1 mph 4 min to reinforce Red tband HS curl 2 sets 10 LAQ 3 # 2 sets 10 HOLD 3 sec in TKE Leg Press 20# 2 sets 10 Calf stretches 10 sec 3 x Black bar heel raises 2 sets 10 Bike L 3    07/26/24 Bike level 3 x 6 minutes Feet on ball K2C, rotation, small bridge, isometric abs Calf stretches 15# HS curls Passive stretch STM to the left thigh, ITB, scar and medial knee  07/19/24 Bike level 4 x 6 minutes Calf stretch Leg press 20#, no weight for ROM 15# HS curls Passive stretch Flexion and extension STM  to the scar, quad and ITB area and into the lateral calf  07/12/24 Passive ROM/AROM STM to the left ITB, medial knee and quads Bike L 4 6 min full rev Leg press 20# 2 sets 10. Calf raises 20# 8x then stopped d/t pain Seated hip flex 20 x alt 2 sets green tband Seated clams green tband 15 x 2 sets #3 LAQ 2 sets 10 3# standing HS curl and marchin 10 x each  07/07/24 Passive ROM/AROM STM to the left ITB, medial knee and quads AROM 0-115 Seated hip flex 20 x alt 2 sets green tband Seated clams green tband 15 x 2 sets Bike L 3 full rev SAQ 2 sets 10 LAQ 3 # 10x Red tband HS curl 10 x with some pain Leg press 20# 2 sets 10  06/29/24 Passive ROM/AROM STM to the left ITB, medial knee and quads Some scar and patellar mobs TKE in standing  06/21/24 Nustep L 5 Feet on ball K2C, rotation, small bridge, isometric abs Ball b/n knees squeeze SAQ Passive HS and ITB stretches STM to the ITB, the scar and the left glutes AROM 117 Green tband HS curl 2 sets 10 LAQ 2# 2 sets 10 Green tband TKE 2 sets 10   06/16/24 Feet on ball K2C, rotation, small bridge, isometric abs Ball b/n knees squeeze SAQ Passive HS and ITB stretches STM to the ITB, the scar and the left glutes Nustep level 5 LE's x 5 mintues  06/09/24 TUG 9s  NuStep L5x63mins  LAQ 3# x10, x6 HS curls green 2x10 STW to quad and ITB, followed by passive flexion and extension Fitter pushes 1 blue band x10, 2 blue bands x10 STS 2x10 Calf stretch 20s x2 ROM flexion at end of session 119d  06/03/24 AROM SOC 110 LAQ 3 # 2 sets 10 Standing HS curl 3# 2 sets 10 Standing 3# SL hip flex,ext and abd 10 x  Nustep L 5 PROM flex and ext STW to left quad and ITB Resisted gait 20# 4 ways 5 x each AROM end of session  117  05/31/24 Belt mobs to left knee to increase capsular stretch Belt mobs for flexion PROM flex and ext STW to left quad and ITB ITB stretch in SL - limited tolerance lying on RT hip Nustep  L 4 6 min Left knee MMT 4+ Left Knee AROM sitting 2-117 Green tband  HS curl 2 sets 10 3# LAQ 2 sets 10 6 inch step up fwd and laterally 10 x each Calf stretch Resisted gait laterally 20 # Worked on gait to increase stride , knee flexion in swing phase and decrease circumduction that will increase ITB pull  05/27/24 Bike partial revs x5 mins Calf stretch Resisted gait 20# 4 way x4 Step ups 6 STS 2x10 Bridges 2x5 Sidelying hip abduction 2x5 ROM- 117d   PATIENT EDUCATION:  Education details: POC/HEP Person educated: Patient Education method: Programmer, Multimedia, Facilities Manager, and Handouts Education comprehension: verbalized understanding  HOME EXERCISE PROGRAM: Access Code: [email or web address] URL: [email or web address] Date: 04/11/2024 Prepared by: Almetta Fam  Exercises - Standing Knee Flexion Stretch on Step  - 3- x daily - 7 x weekly - 1 sets - 5-10 reps - 5-10 hold - Heel Raise on Step  - 3 x daily - 7 x weekly - 1 sets - 10 reps - Standing Gastroc Stretch on Step  - 3 x daily - 7 x weekly - 1 sets - 10 reps - Forward Step Touch  - 3 x daily - 7 x weekly - 1 sets - 10 reps - Seated Knee Flexion Slide  - 3 x daily - 7 x weekly - 1 sets - 10 reps - Sit to Stand  - 3 x daily - 7 x weekly - 1 sets - 10 reps  ASSESSMENT:  CLINICAL IMPRESSION:  global life is better. Pain is terrible.still cannot tolerate leg down > 10 min. Amb in with better gait pattern than last week- longer stride and step thru with less circumduction a little more natural bend in the knee.   Using TENS a lot and some relief. Still very limited to tolerance to pressure from pants on leg.No changes in goals. Progressed strength as tolerated  sh edid tolerate more today and seemed less sensitive with STW    OBJECTIVE IMPAIRMENTS: Abnormal gait, cardiopulmonary status limiting activity, decreased activity tolerance, decreased balance, decreased endurance, decreased mobility, difficulty walking, decreased ROM,  decreased strength, increased edema, increased muscle spasms, impaired flexibility, impaired vision/preception, improper body mechanics, postural dysfunction, and pain.   REHAB POTENTIAL: Good  CLINICAL DECISION MAKING: Evolving/moderate complexity  EVALUATION COMPLEXITY: Moderate   GOALS: Goals reviewed with patient? Yes  SHORT TERM GOALS: Target date: 04/20/24 Independent with initial HEP Baseline: Goal status: 04/01/24 MET  LONG TERM GOALS: Target date: 06/29/24  Independent with advanced HEP Baseline:  Goal status: progressing12/3/25  2.  Independent with RICE Baseline:  Goal status: MET 04/14/24  3.  Decrease pain 50% for better function and quality of life Baseline:  Goal status: progressing 04/14/24, not 50% yet progressing 04/29/24 05/06/24 progressing  05/13/24 40-50% PROGRESSING 05/31/24 progressing   6/10 pain, ITB made it worse 06/16/24 and  07/07/24, 07/19/24, 07/26/24 08/04/24 pain is still an issue  4.  Decrease TUG to 12 seconds without device Baseline:  Goal status: 04/22/24 MET, 11 seconds without device 05/03/24, 9s MET 06/09/24  5.  Increase AROM of the left knee to 4-116 degrees flexion Baseline:  Goal status: 04/14/24 2-100 progressing  and 04/22/24, 0-110d 04/29/24 progressing 05/13/24 MET  6.  Go up and down steps step over step Baseline:  Goal status: 04/14/24 progressing, progressing 04/29/24 and 05/06/24  05/31/24 reverted back since ITB flare up, still has difficulty 06/16/24 and 06/21/24, 06/29/24, 07/19/24, 07/26/24 Slow progression and still issues 08/18/24   PLAN:  PT FREQUENCY: 2x/week  PT DURATION: 12 weeks  PLANNED INTERVENTIONS: 97164- PT Re-evaluation, 97110-Therapeutic exercises,  02469- Therapeutic activity, [id number]- Neuromuscular re-education, [phone]- Self Care, 02859- Manual therapy, [phone]- Gait training, [phone]- Electrical stimulation (unattended), [phone]- Vasopneumatic device, Patient/Family education, Balance training, Stair training,  Taping, Joint mobilization, and Cryotherapy  PLAN FOR NEXT SESSION: 2 additional visits remain and then possible HOLD until MD early MArch, will skip next week and see if she maintains her ROM on her own OBADIAH OZELL ORN, PT 08/18/2024, 2:51 PM  "

## 2024-08-22 ENCOUNTER — Other Ambulatory Visit: Payer: Self-pay | Admitting: Family Medicine

## 2024-08-22 MED ORDER — UBRELVY 100 MG PO TABS: ORAL_TABLET | ORAL | 2 refills | Status: AC

## 2024-08-25 ENCOUNTER — Ambulatory Visit: Payer: Medicare (Managed Care) | Admitting: Physical Therapy

## 2024-09-02 ENCOUNTER — Ambulatory Visit: Payer: Medicare (Managed Care) | Admitting: Physical Therapy

## 2024-09-02 DIAGNOSIS — R262 Difficulty in walking, not elsewhere classified: Secondary | ICD-10-CM

## 2024-09-02 DIAGNOSIS — M25562 Pain in left knee: Secondary | ICD-10-CM

## 2024-09-02 DIAGNOSIS — M25662 Stiffness of left knee, not elsewhere classified: Secondary | ICD-10-CM

## 2024-09-02 NOTE — Therapy (Signed)
 " OUTPATIENT PHYSICAL THERAPY LOWER EXTREMITY        Patient Name: Debbie Hayes MRN: [id number] DOB:01-07-1951, 74 y.o., female Today's Date: 09/02/2024  END OF SESSION:  PT End of Session - 09/02/24 1016     Visit Number 31    Date for Recertification  09/02/24    Authorization Type [employer] medicare    PT Start Time 1015    PT Stop Time 1055    PT Time Calculation (min) 40 min               Past Medical History:  Diagnosis Date   Allergy    Asthma    Cancer (HCC)    thyroid  cancer   Cataract    Chronic migraine BOTOX INJECTION EVERY 3 MONTHS   Chronic pain in right shoulder    Constipation    Coronary artery disease    Diabetes mellitus    Disorder of inner ear CAUSES VERTIGO OCCASIONALLY   Fibromyalgia    GERD (gastroesophageal reflux disease)    Hemorrhoid    History of bladder infections    History of thyroid  cancer 2009  S/P TOTAL THYROIDECTOMY AND RADIATION   Hyperlipidemia    Hypertension CARDIOLOGIST- DR BLANCA- WILL REQUEST LATE NOTE   DENIES S & S   Hypothyroidism    Insomnia    Left shoulder pain    Macular degeneration    MRSA infection    nasal treated more than 15 years ago   Normal nuclear stress test 01/25/2009   OA (osteoarthritis) JOINTS   Osteopenia    Osteoporosis    Painful orthopaedic hardware    left foot   Pneumonia    PONV (postoperative nausea and vomiting) SEVERE   Rotator cuff disorder LEFT SHOULDER RTC IMPINGMENT   Sleep apnea    mouth guard, no cpap    Sleep apnea in adult 06/26/2017   Spondylolisthesis of lumbar region    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   Wears glasses    Past Surgical History:  Procedure Laterality Date   BACK SURGERY     Dr Mavis- spondylosis   BILATERAL CARPAL TUNNEL RELEASE  1994   BILATERAL ELBOW SURG.  1999   BILATERAL SALPINGOOPHECTOMY  1993   POST-OP URETER REPAIR 12 DAYS AFTER    CATARACT EXTRACTION, BILATERAL     COLONOSCOPY     EYE SURGERY     FOOT ARTHRODESIS Left  07/10/2016   Procedure: LEFT 2ND TARSAL METATARSAL ARTHRODESIS  GASTROC RECESSION LEFT [employer] MODIFIED MCBRIDE BUIONECTOMY;  Surgeon: Norleen Armor, MD;  Location: [facility];  Service: Orthopedics;  Laterality: Left;   GASTROC RECESSION EXTREMITY Left 07/10/2016   Procedure: GASTROC RECESSION EXTREMITY;  Surgeon: Norleen Armor, MD;  Location: [facility];  Service: Orthopedics;  Laterality: Left;   HARDWARE REMOVAL Left 05/20/2018   Procedure: Left foot removal of deep implants;  Surgeon: Armor Norleen, MD;  Location: [facility];  Service: Orthopedics;  Laterality: Left;    LEFT THUMB JOINT REPLACEMENT  2005   RIGHT DONE IN 2004   OOPHORECTOMY     POLYPECTOMY     RIGHT KNEE ARTHROSCOPY  X2  BEFORE 2011   RIGHT KNEE ARTHROSCOPY/ PARTIAL LATERAL MENISECTOMY/ TRICOMPARTMENT CHONDROPLASTY/ DECOMPRESSION CYST  10/26/2009   RIGHT KNEE CLOSED MANIPULATION  09/11/2010   RIGHT SHOULDER ARTHROSCOPY  2010  &  2004   TIBIA CYST REMOVED AND ORIF LEG FX  1958   TONSILLECTOMY  1968  TOTAL KNEE ARTHROPLASTY  07/16/2010   RIGHT   TOTAL KNEE ARTHROPLASTY Left 03/22/2024   Procedure: ARTHROPLASTY, KNEE, TOTAL;  Surgeon: Josefina Chew, MD;  Location: [facility];  Service: Orthopedics;  Laterality: Left;   TOTAL THYROIDECTOMY  2009   CANCER  (POST-OP BLEED)  AND RADIATION TX   trigger finger repair      07/2023   trigger fingers Right 2013   3rd and 4th fingers   UPPER GASTROINTESTINAL ENDOSCOPY     VAGINAL HYSTERECTOMY  1990   Patient Active Problem List   Diagnosis Date Noted   Primary hypertension 05/12/2024   Pain of right thumb 02/26/2024   Precordial pain 12/23/2023   Primary osteoarthritis of left knee with subchondral insufficiency fractures 09/23/2023   History of arthroplasty of right knee 07/03/2023   Primary osteoarthritis of right shoulder 07/03/2023   Coronary artery disease involving native coronary artery of native heart without angina  pectoris 05/25/2023   Coronary artery calcification 11/06/2022   Statin myopathy 11/06/2022   Frequent UTI 10/29/2022   Wart of hand 09/30/2022   Acute cystitis without hematuria 05/14/2021   Medial epicondylitis, right 03/25/2021   Trigger thumb, right thumb 11/19/2020   Right wrist pain 10/23/2020   Osteoporosis 04/05/2020   Trigger finger, left middle finger 07/27/2019   Chronic neck pain 11/10/2018   Orthopedic hardware present 02/17/2018   Controlled diabetes mellitus type 2 with complications (HCC) 12/15/2017   Intractable chronic migraine without aura and with status migrainosus 12/15/2017   Arthralgia 12/15/2017   Joint pain 12/15/2017   Migraine, transformed 12/15/2017   Chronic migraine without aura, with intractable migraine, so stated, with status migrainosus 12/15/2017   Pain of left shoulder joint on movement 10/19/2017   OSA (obstructive sleep apnea) 06/26/2017   Snoring 03/25/2016   Palpitations 03/25/2016   Headache 03/25/2016   Sensory disorder of trigeminal nerve 11/26/2015   Spondylolisthesis of lumbar region 08/09/2014   Lumbar adjacent segment disease with spondylolisthesis 08/09/2014   Back pain 02/17/2014   Macular degeneration 02/17/2014   Backache 02/17/2014   Other and unspecified hyperlipidemia 10/14/2013   Hyperlipidemia 10/14/2013   Onychomycosis 12/01/2012   Arthritis 04/12/2012   Hypothyroidism 04/12/2012    PCP: Copland, MD  REFERRING PROVIDER: Josefina, MD  REFERRING DIAG: s/p Left TKA  THERAPY DIAG:  Stiffness of left knee, not elsewhere classified  Acute pain of left knee  Difficulty in walking, not elsewhere classified  Rationale for Evaluation and Treatment: Rehabilitation  ONSET DATE: 03/22/24  SUBJECTIVE:   SUBJECTIVE STATEMENT:  last few monthes overall better but pain still and issue and can not keep leg down. Paresthesia neuropathy? Diagnosis from husbands MD and recommend seeing physiatrist ( for Nerve conduction test)so  will ask when I follow up with surgeon.  PAIN:  Are you having pain? Left knee 5/10  PRECAUTIONS: None  RED FLAGS: None   WEIGHT BEARING RESTRICTIONS: No  FALLS:  Has patient fallen in last 6 months? No  LIVING ENVIRONMENT: Lives with: lives with their family Lives in: House/apartment Stairs: Yes: Internal: 2 steps; can reach both Has following equipment at home: Single point cane, Walker - 2 wheeled, Crutches, and shower chair  OCCUPATION: retired  PLOF: Independent and shopping and housework  PATIENT GOALS: have normal walk without device, less pain  NEXT MD VISIT: 04/06/24  OBJECTIVE:  Note: Objective measures were completed at Evaluation unless otherwise noted.   COGNITION: Overall cognitive status: Within functional limits for tasks assessed     SENSATION:  WFL  EDEMA:  Circumferential: mid patella 47 cm on the left, 40 cm on the right  MUSCLE LENGTH: Tight calves and HS  POSTURE: rounded shoulders and forward head  PALPATION: Tender, ecchymosis left thigh and knee, inner and posterior  LOWER EXTREMITY ROM:  Active ROM AROM LEft eval PROM Left eval LLE 04/01/24 AROM  LLE 04/04/24 04/26/24 04/29/24 05/03/24 AROM/PROM 06/29/24 AROM 07/19/24 AROM  Hip flexion           Hip extension           Hip abduction           Hip adduction           Hip internal rotation           Hip external rotation           Knee flexion 65 70 82 85 112 110 112/117 114 115  Knee extension 15 9 5 5  0 0 4/0 0 0  Ankle dorsiflexion           Ankle plantarflexion           Ankle inversion           Ankle eversion            (Blank rows = not tested)  LOWER EXTREMITY MMT:  MMT Right eval Left eval Left  06/29/24  Hip flexion     Hip extension     Hip abduction     Hip adduction     Hip internal rotation     Hip external rotation     Knee flexion  2 4-  Knee extension  2 4-  Ankle dorsiflexion     Ankle plantarflexion     Ankle inversion     Ankle eversion       (Blank rows = not tested)  LOWER EXTREMITY SPECIAL TESTS:  Knee special tests: quad lag, ballotable patella  FUNCTIONAL TESTS:  Timed up and go (TUG): 17 seconds  GAIT: Distance walked: 60 feet Assistive device utilized: Environmental Consultant - 2 wheeled Level of assistance: Modified independence Comments: mild antalgic gait                                                                                                                                TREATMENT DATE:   09/02/24 AROM at start of session sitting 0-121 Bike L 4 HS curls 10# 2x10 LEg ext 5# 2x10 LEg press 20# Feet on ball K2C, rotation, small bridge, isometric abs STM to the left medial knee and the ITB area  08/18/24 Bike level 3.5 x 6 minutes HS curls 10# 2x10 LEg ext 5# 2x10 LEg press 20# Feet on ball K2C, rotation, small bridge, isometric abs STM to the left medial knee and the ITB area AROM 0-120 degrees flexion  08/11/24 Nustep L 5 Leg Press 20# 2 sets 10 Black bar heel raise and toe raise 20 x HS  curl 10# 2 sets 10 Knee ext 5# 2 sets 10 STW left thigh and knee PROM flex and ext with end range hold Pat mobs AROm 0-118 MMT 5/5    08/04/24 AROM 0-118 Passive end range stretching, pat mobs, STW Worked on gait working on decreased circumduction, toe off and knee flexion in swing phase TM 1 mph 4 min to reinforce Red tband HS curl 2 sets 10 LAQ 3 # 2 sets 10 HOLD 3 sec in TKE Leg Press 20# 2 sets 10 Calf stretches 10 sec 3 x Black bar heel raises 2 sets 10 Bike L 3    07/26/24 Bike level 3 x 6 minutes Feet on ball K2C, rotation, small bridge, isometric abs Calf stretches 15# HS curls Passive stretch STM to the left thigh, ITB, scar and medial knee  07/19/24 Bike level 4 x 6 minutes Calf stretch Leg press 20#, no weight for ROM 15# HS curls Passive stretch Flexion and extension STM to the scar, quad and ITB area and into the lateral calf  07/12/24 Passive ROM/AROM STM to the left  ITB, medial knee and quads Bike L 4 6 min full rev Leg press 20# 2 sets 10. Calf raises 20# 8x then stopped d/t pain Seated hip flex 20 x alt 2 sets green tband Seated clams green tband 15 x 2 sets #3 LAQ 2 sets 10 3# standing HS curl and marchin 10 x each  07/07/24 Passive ROM/AROM STM to the left ITB, medial knee and quads AROM 0-115 Seated hip flex 20 x alt 2 sets green tband Seated clams green tband 15 x 2 sets Bike L 3 full rev SAQ 2 sets 10 LAQ 3 # 10x Red tband HS curl 10 x with some pain Leg press 20# 2 sets 10  06/29/24 Passive ROM/AROM STM to the left ITB, medial knee and quads Some scar and patellar mobs TKE in standing  06/21/24 Nustep L 5 Feet on ball K2C, rotation, small bridge, isometric abs Ball b/n knees squeeze SAQ Passive HS and ITB stretches STM to the ITB, the scar and the left glutes AROM 117 Green tband HS curl 2 sets 10 LAQ 2# 2 sets 10 Green tband TKE 2 sets 10   06/16/24 Feet on ball K2C, rotation, small bridge, isometric abs Ball b/n knees squeeze SAQ Passive HS and ITB stretches STM to the ITB, the scar and the left glutes Nustep level 5 LE's x 5 mintues  06/09/24 TUG 9s  NuStep L5x59mins  LAQ 3# x10, x6 HS curls green 2x10 STW to quad and ITB, followed by passive flexion and extension Fitter pushes 1 blue band x10, 2 blue bands x10 STS 2x10 Calf stretch 20s x2 ROM flexion at end of session 119d  06/03/24 AROM SOC 110 LAQ 3 # 2 sets 10 Standing HS curl 3# 2 sets 10 Standing 3# SL hip flex,ext and abd 10 x  Nustep L 5 PROM flex and ext STW to left quad and ITB Resisted gait 20# 4 ways 5 x each AROM end of session  117  05/31/24 Belt mobs to left knee to increase capsular stretch Belt mobs for flexion PROM flex and ext STW to left quad and ITB ITB stretch in SL - limited tolerance lying on RT hip Nustep L 4 6 min Left knee MMT 4+ Left Knee AROM sitting 2-117 Green tband HS curl 2 sets 10 3# LAQ 2 sets  10 6 inch step up fwd and laterally  10 x each Calf stretch Resisted gait laterally 20 # Worked on gait to increase stride , knee flexion in swing phase and decrease circumduction that will increase ITB pull  05/27/24 Bike partial revs x5 mins Calf stretch Resisted gait 20# 4 way x4 Step ups 6 STS 2x10 Bridges 2x5 Sidelying hip abduction 2x5 ROM- 117d   PATIENT EDUCATION:  Education details: POC/HEP Person educated: Patient Education method: Programmer, Multimedia, Facilities Manager, and Handouts Education comprehension: verbalized understanding  HOME EXERCISE PROGRAM: Access Code: [email or web address] URL: [email or web address] Date: 04/11/2024 Prepared by: Almetta Fam  Exercises - Standing Knee Flexion Stretch on Step  - 3- x daily - 7 x weekly - 1 sets - 5-10 reps - 5-10 hold - Heel Raise on Step  - 3 x daily - 7 x weekly - 1 sets - 10 reps - Standing Gastroc Stretch on Step  - 3 x daily - 7 x weekly - 1 sets - 10 reps - Forward Step Touch  - 3 x daily - 7 x weekly - 1 sets - 10 reps - Seated Knee Flexion Slide  - 3 x daily - 7 x weekly - 1 sets - 10 reps - Sit to Stand  - 3 x daily - 7 x weekly - 1 sets - 10 reps  ASSESSMENT:  CLINICAL IMPRESSION: last few monthes overall better but pain still and issue and can not keep leg down. Paresthesia neuropathy? Diagnosis from husbands MD and recommend seeing physiatrist ( for Nerve conduction test)so will ask when I follow up with surgeon first .AROM at start of session when she walked in 0-121. Gaols met except pain so will D/C as this time.  OBJECTIVE IMPAIRMENTS: Abnormal gait, cardiopulmonary status limiting activity, decreased activity tolerance, decreased balance, decreased endurance, decreased mobility, difficulty walking, decreased ROM, decreased strength, increased edema, increased muscle spasms, impaired flexibility, impaired vision/preception, improper body mechanics, postural dysfunction, and pain.   REHAB POTENTIAL:  Good  CLINICAL DECISION MAKING: Evolving/moderate complexity  EVALUATION COMPLEXITY: Moderate   GOALS: Goals reviewed with patient? Yes  SHORT TERM GOALS: Target date: 04/20/24 Independent with initial HEP Baseline: Goal status: 04/01/24 MET  LONG TERM GOALS: Target date: 06/29/24  Independent with advanced HEP Baseline:  Goal status: progressing12/3/25  09/02/24 MET  2.  Independent with RICE Baseline:  Goal status: MET 04/14/24  3.  Decrease pain 50% for better function and quality of life Baseline:  Goal status: progressing 04/14/24, not 50% yet progressing 04/29/24 05/06/24 progressing  05/13/24 40-50% PROGRESSING 05/31/24 progressing   6/10 pain, ITB made it worse 06/16/24 and  07/07/24, 07/19/24, 07/26/24 08/04/24 pain is still an issue  2/6/26MET for func  4.  Decrease TUG to 12 seconds without device Baseline:  Goal status: 04/22/24 MET, 11 seconds without device 05/03/24, 9s MET 06/09/24  5.  Increase AROM of the left knee to 4-116 degrees flexion Baseline:  Goal status: 04/14/24 2-100 progressing  and 04/22/24, 0-110d 04/29/24 progressing 05/13/24 MET  6.  Go up and down steps step over step Baseline:  Goal status: 04/14/24 progressing, progressing 04/29/24 and 05/06/24  05/31/24 reverted back since ITB flare up, still has difficulty 06/16/24 and 06/21/24, 06/29/24, 07/19/24, 07/26/24 Slow progression and still issues 08/18/24  09/02/24 can do but pian ful still 09/02/24   PLAN:  PT FREQUENCY: 2x/week  PT DURATION: 12 weeks  PLANNED INTERVENTIONS: 97164- PT Re-evaluation, 97110-Therapeutic exercises, 97530- Therapeutic activity, 97112- Neuromuscular re-education, 97535- Self Care, [id number]- Manual therapy, 97116- Gait training, [id number]- Electrical stimulation (unattended), 97016- Vasopneumatic device,  Patient/Family education, Balance training, Stair training, Taping, Joint mobilization, and Cryotherapy  PLAN FOR NEXT SESSION: D/C and will call after MD follow ups if  needed    PHYSICAL THERAPY DISCHARGE SUMMARY   Patient agrees to discharge. Patient goals were partially met. Patient is being discharged due to meeting the stated rehab goals.  Almetta Fam, PT, DPT [place], PTA 09/02/2024, 10:29 AM  "

## 2025-01-25 ENCOUNTER — Ambulatory Visit
